# Patient Record
Sex: Male | Born: 1965 | Race: White | Hispanic: No | Marital: Single | Smoking: Former smoker
Health system: Southern US, Community
[De-identification: ages and names within clinical notes are randomized; demographics above are authoritative.]

## PROBLEM LIST (undated history)

## (undated) DIAGNOSIS — E440 Moderate protein-calorie malnutrition: Secondary | ICD-10-CM

## (undated) DIAGNOSIS — M545 Low back pain: Secondary | ICD-10-CM

## (undated) DIAGNOSIS — G8929 Other chronic pain: Secondary | ICD-10-CM

## (undated) DIAGNOSIS — M1990 Unspecified osteoarthritis, unspecified site: Secondary | ICD-10-CM

## (undated) DIAGNOSIS — I76 Septic arterial embolism: Secondary | ICD-10-CM

## (undated) DIAGNOSIS — I340 Nonrheumatic mitral (valve) insufficiency: Secondary | ICD-10-CM

## (undated) DIAGNOSIS — I058 Other rheumatic mitral valve diseases: Secondary | ICD-10-CM

## (undated) DIAGNOSIS — F1910 Other psychoactive substance abuse, uncomplicated: Secondary | ICD-10-CM

## (undated) DIAGNOSIS — G009 Bacterial meningitis, unspecified: Secondary | ICD-10-CM

## (undated) DIAGNOSIS — J90 Pleural effusion, not elsewhere classified: Secondary | ICD-10-CM

## (undated) DIAGNOSIS — N179 Acute kidney failure, unspecified: Secondary | ICD-10-CM

## (undated) DIAGNOSIS — I059 Rheumatic mitral valve disease, unspecified: Secondary | ICD-10-CM

## (undated) DIAGNOSIS — I509 Heart failure, unspecified: Secondary | ICD-10-CM

## (undated) DIAGNOSIS — D696 Thrombocytopenia, unspecified: Secondary | ICD-10-CM

## (undated) DIAGNOSIS — B9562 Methicillin resistant Staphylococcus aureus infection as the cause of diseases classified elsewhere: Secondary | ICD-10-CM

## (undated) DIAGNOSIS — M009 Pyogenic arthritis, unspecified: Secondary | ICD-10-CM

## (undated) DIAGNOSIS — B1920 Unspecified viral hepatitis C without hepatic coma: Secondary | ICD-10-CM

## (undated) DIAGNOSIS — R768 Other specified abnormal immunological findings in serum: Secondary | ICD-10-CM

## (undated) DIAGNOSIS — R7881 Bacteremia: Secondary | ICD-10-CM

## (undated) HISTORY — PX: ANKLE SURGERY: SHX546

## (undated) HISTORY — PX: TONSILLECTOMY: SUR1361

## (undated) HISTORY — PX: FRACTURE SURGERY: SHX138

---

## 1992-06-03 HISTORY — PX: HIP FRACTURE SURGERY: SHX118

## 2004-12-30 ENCOUNTER — Emergency Department (HOSPITAL_COMMUNITY): Admission: EM | Admit: 2004-12-30 | Discharge: 2004-12-30 | Payer: Self-pay | Admitting: Emergency Medicine

## 2005-02-15 ENCOUNTER — Emergency Department (HOSPITAL_COMMUNITY): Admission: EM | Admit: 2005-02-15 | Discharge: 2005-02-15 | Payer: Self-pay | Admitting: Emergency Medicine

## 2005-06-21 ENCOUNTER — Emergency Department (HOSPITAL_COMMUNITY): Admission: EM | Admit: 2005-06-21 | Discharge: 2005-06-21 | Payer: Self-pay | Admitting: Emergency Medicine

## 2007-02-11 ENCOUNTER — Emergency Department (HOSPITAL_COMMUNITY): Admission: EM | Admit: 2007-02-11 | Discharge: 2007-02-11 | Payer: Self-pay | Admitting: Emergency Medicine

## 2012-11-18 ENCOUNTER — Encounter (HOSPITAL_COMMUNITY): Payer: Self-pay

## 2012-11-18 ENCOUNTER — Emergency Department (HOSPITAL_COMMUNITY)
Admission: EM | Admit: 2012-11-18 | Discharge: 2012-11-19 | Disposition: A | Discharge status: Home / Self Care | Payer: Self-pay | Attending: Emergency Medicine | Admitting: Emergency Medicine

## 2012-11-18 DIAGNOSIS — Y9389 Activity, other specified: Secondary | ICD-10-CM | POA: Insufficient documentation

## 2012-11-18 DIAGNOSIS — F17200 Nicotine dependence, unspecified, uncomplicated: Secondary | ICD-10-CM | POA: Insufficient documentation

## 2012-11-18 DIAGNOSIS — W540XXA Bitten by dog, initial encounter: Secondary | ICD-10-CM | POA: Insufficient documentation

## 2012-11-18 DIAGNOSIS — S01319D Laceration without foreign body of unspecified ear, subsequent encounter: Secondary | ICD-10-CM

## 2012-11-18 DIAGNOSIS — S01309A Unspecified open wound of unspecified ear, initial encounter: Secondary | ICD-10-CM | POA: Insufficient documentation

## 2012-11-18 DIAGNOSIS — Y929 Unspecified place or not applicable: Secondary | ICD-10-CM | POA: Insufficient documentation

## 2012-11-18 DIAGNOSIS — T148XXA Other injury of unspecified body region, initial encounter: Secondary | ICD-10-CM

## 2012-11-18 NOTE — ED Notes (Signed)
Pt presents with c/o dog bite. Pt was bit by a pit bull mix about 30 minutes ago. Pt has a 2 inch laceration on the left lower part of his hairline on the left side. Pt also has a tear/laceration to the top of his left ear. Bleeding controlled at this time.

## 2012-11-19 MED ORDER — HYDROMORPHONE HCL PF 1 MG/ML IJ SOLN
1.0000 mg | Freq: Once | INTRAMUSCULAR | Status: AC
  Administered 2012-11-19: 1 mg via INTRAVENOUS
  Filled 2012-11-19: qty 1

## 2012-11-19 MED ORDER — AMOXICILLIN-POT CLAVULANATE 875-125 MG PO TABS: 1.0000 | ORAL_TABLET | Freq: Two times a day (BID) | ORAL | Status: DC

## 2012-11-19 MED ORDER — SODIUM CHLORIDE 0.9 % IV SOLN
Freq: Once | INTRAVENOUS | Status: AC
  Administered 2012-11-19: 01:00:00 via INTRAVENOUS

## 2012-11-19 MED ORDER — TETANUS-DIPHTH-ACELL PERTUSSIS 5-2.5-18.5 LF-MCG/0.5 IM SUSP
0.5000 mL | Freq: Once | INTRAMUSCULAR | Status: AC
  Administered 2012-11-19: 0.5 mL via INTRAMUSCULAR
  Filled 2012-11-19: qty 0.5

## 2012-11-19 MED ORDER — OXYCODONE-ACETAMINOPHEN 5-325 MG PO TABS: 1.0000 | ORAL_TABLET | ORAL | Status: DC | PRN

## 2012-11-19 MED ORDER — OXYCODONE-ACETAMINOPHEN 5-325 MG PO TABS
1.0000 | ORAL_TABLET | Freq: Once | ORAL | Status: AC
  Administered 2012-11-19: 1 via ORAL
  Filled 2012-11-19: qty 1

## 2012-11-19 MED ORDER — ONDANSETRON HCL 4 MG/2ML IJ SOLN
4.0000 mg | Freq: Once | INTRAMUSCULAR | Status: AC
  Administered 2012-11-19: 4 mg via INTRAVENOUS
  Filled 2012-11-19: qty 2

## 2012-11-19 MED ORDER — SODIUM CHLORIDE 0.9 % IV SOLN
3.0000 g | Freq: Once | INTRAVENOUS | Status: AC
  Administered 2012-11-19: 3 g via INTRAVENOUS
  Filled 2012-11-19: qty 3

## 2012-11-19 NOTE — ED Provider Notes (Signed)
History     CSN: [id number]  Arrival date & time 11/18/12  2346   First MD Initiated Contact with Patient 11/19/12 0001      Chief Complaint  Patient presents with  . Animal Bite    (Consider location/radiation/quality/duration/timing/severity/associated sxs/prior treatment) HPI Comments: Patient states he was in his yard with a dog, that they've had for approximately 6 months he inadvertently knocked him to the ground.  The dog then grabbed him by the head  he now has a laceration approximately 5 cm long to the base of the scalp, along the hairline, as well as a complex laceration to the pinna through the tragus of his left ear.  No active bleeding at this time.  The rabies status of the dog is unknown.  Patient's tetanus status is out of date.  The dog is in a candle in her home at this time  Patient is a 47 y.o. male presenting with animal bite. The history is provided by the patient.  Animal Bite Contact animal:  Dog Time since incident:  1 hour Pain details:    Severity:  Severe   Timing:  Constant Incident location:  Home Provoked: unprovoked   Notifications:  None Animal's rabies vaccination status:  Unknown Animal in possession: yes   Tetanus status:  Out of date Relieved by:  None tried Worsened by:  Activity Associated symptoms: no fever and no numbness     History reviewed. No pertinent past medical history.  Past Surgical History  Procedure Laterality Date  . Hip surgery    . Ankle surgery      No family history on file.  History  Substance Use Topics  . Smoking status: Current Every Day Smoker  . Smokeless tobacco: Not on file  . Alcohol Use: Yes     Comment: occasionally       Review of Systems  Constitutional: Negative for fever and chills.  HENT: Negative for hearing loss.   Respiratory: Negative.   Cardiovascular: Negative.   Gastrointestinal: Negative for nausea.  Genitourinary: Negative.   Skin: Positive for wound.    Allergic/Immunologic: Negative.   Neurological: Negative for dizziness, numbness and headaches.  Hematological: Negative.   Psychiatric/Behavioral: Negative.   All other systems reviewed and are negative.    Allergies  Review of patient's allergies indicates no known allergies.  Home Medications   Current Outpatient Rx  Name  Route  Sig  Dispense  Refill  . amoxicillin-clavulanate (AUGMENTIN) 875-125 MG per tablet   Oral   Take 1 tablet by mouth 2 (two) times daily.   23 tablet   0   . oxyCODONE-acetaminophen (PERCOCET/ROXICET) 5-325 MG per tablet   Oral   Take 1 tablet by mouth every 4 (four) hours as needed for pain.   30 tablet   0     BP 114/61  Pulse 92  Temp(Src) 99.2 F (37.3 C) (Oral)  Resp 18  Ht 5\' 11"  (1.803 m)  Wt 185 lb (83.915 kg)  BMI 25.81 kg/m2  SpO2 95%  Physical Exam  Nursing note and vitals reviewed. Constitutional: He appears well-developed and well-nourished.  HENT:  Head: Normocephalic. Head is with laceration.    Ears:  Laceration with cartilage exposed  Linear laceration to base of skull 5CM   Eyes: Pupils are equal, round, and reactive to light.  Neck: Normal range of motion.  Cardiovascular: Normal rate and regular rhythm.   Pulmonary/Chest: Effort normal and breath sounds normal.  Musculoskeletal: Normal range of  motion.  Lymphadenopathy:    He has no cervical adenopathy.  Neurological: He is alert.  Skin: Skin is warm and dry.    ED Course  LACERATION REPAIR Date/Time: 11/19/2012 2:19 AM Performed by: Arman Filter Authorized by: Arman Filter Consent: Verbal consent obtained. Risks and benefits: risks, benefits and alternatives were discussed Consent given by: patient Patient understanding: patient states understanding of the procedure being performed Patient identity confirmed: verbally with patient Time out: Immediately prior to procedure a "time out" was called to verify the correct patient, procedure,  equipment, support staff and site/side marked as required. Body area: head/neck Location details: neck Laceration length: 5 cm Foreign bodies: no foreign bodies Tendon involvement: none Nerve involvement: none Vascular damage: no Anesthesia: local infiltration Local anesthetic: lidocaine 1% with epinephrine Anesthetic total: 2 ml Patient sedated: no Preparation: Patient was prepped and draped in the usual sterile fashion. Irrigation solution: saline Irrigation method: syringe Amount of cleaning: standard Debridement: none Degree of undermining: none Skin closure: 4-0 Prolene Number of sutures: 3 Technique: simple Approximation: loose Approximation difficulty: simple Dressing: 4x4 sterile gauze Patient tolerance: Patient tolerated the procedure well with no immediate complications. Comments: Wet dressing placed over left ear, Kerlix use to secure dressing in place   (including critical care time)  Labs Reviewed - No data to display No results found.   1. Bite by animal   2. Laceration of ear lobe, unspecified laterality, subsequent encounter       MDM  Extensive laceration to left ear.  Will contact ENT Dr. Chales Salmon  , request that patient come to his office at 8 AM in the morning for repair of his extensive, your laceration.  Patient will be supplied with prescription for antibiotic, as well as pain control in the emergency department.  He received 3 g of Unasyn IV pain control.  His tetanus was also updated.  He understands the importance of followup with Dr. Chales Salmon, and agrees to the plan        Arman Filter, NP 11/19/12 0230

## 2012-11-19 NOTE — ED Provider Notes (Signed)
Medical screening examination/treatment/procedure(s) were conducted as a shared visit with non-physician practitioner(s) and myself.  I personally evaluated the patient during the encounter.  Pt s/p dog bite. He has complicated laceration to the left ear involving cartilage.  Will require multiple layer repair.  D/w Dr Chales Salmon who requests patient come to the office at 8 am, NPO after midnight for repair.  Ms Tomasa Blase to repair posterior scalp injury.  Updated on tetanus, given abx.  Olivia Mackie, MD 11/19/12 [phone]

## 2015-12-30 ENCOUNTER — Emergency Department (HOSPITAL_COMMUNITY): Payer: No Typology Code available for payment source

## 2015-12-30 ENCOUNTER — Emergency Department (HOSPITAL_COMMUNITY)
Admission: EM | Admit: 2015-12-30 | Discharge: 2015-12-30 | Disposition: A | Discharge status: Home / Self Care | Payer: No Typology Code available for payment source | Attending: Emergency Medicine | Admitting: Emergency Medicine

## 2015-12-30 ENCOUNTER — Encounter (HOSPITAL_COMMUNITY): Payer: Self-pay | Admitting: Emergency Medicine

## 2015-12-30 DIAGNOSIS — M549 Dorsalgia, unspecified: Secondary | ICD-10-CM | POA: Diagnosis present

## 2015-12-30 DIAGNOSIS — Y92411 Interstate highway as the place of occurrence of the external cause: Secondary | ICD-10-CM | POA: Insufficient documentation

## 2015-12-30 DIAGNOSIS — Y999 Unspecified external cause status: Secondary | ICD-10-CM | POA: Insufficient documentation

## 2015-12-30 DIAGNOSIS — Z87891 Personal history of nicotine dependence: Secondary | ICD-10-CM | POA: Insufficient documentation

## 2015-12-30 DIAGNOSIS — Z79899 Other long term (current) drug therapy: Secondary | ICD-10-CM | POA: Diagnosis not present

## 2015-12-30 DIAGNOSIS — Y9389 Activity, other specified: Secondary | ICD-10-CM | POA: Insufficient documentation

## 2015-12-30 DIAGNOSIS — M542 Cervicalgia: Secondary | ICD-10-CM | POA: Insufficient documentation

## 2015-12-30 DIAGNOSIS — M62830 Muscle spasm of back: Secondary | ICD-10-CM | POA: Diagnosis not present

## 2015-12-30 MED ORDER — DIAZEPAM 5 MG PO TABS
5.0000 mg | ORAL_TABLET | Freq: Once | ORAL | Status: AC
  Administered 2015-12-30: 5 mg via ORAL
  Filled 2015-12-30: qty 1

## 2015-12-30 MED ORDER — OXYCODONE-ACETAMINOPHEN 5-325 MG PO TABS: 1.0000 | ORAL_TABLET | ORAL | 0 refills | Status: DC | PRN

## 2015-12-30 MED ORDER — KETOROLAC TROMETHAMINE 30 MG/ML IJ SOLN
30.0000 mg | Freq: Once | INTRAMUSCULAR | Status: AC
  Administered 2015-12-30: 30 mg via INTRAMUSCULAR
  Filled 2015-12-30: qty 1

## 2015-12-30 MED ORDER — OXYCODONE-ACETAMINOPHEN 5-325 MG PO TABS
1.0000 | ORAL_TABLET | Freq: Once | ORAL | Status: AC
  Administered 2015-12-30: 1 via ORAL
  Filled 2015-12-30: qty 1

## 2015-12-30 MED ORDER — DIAZEPAM 5 MG PO TABS: 5.0000 mg | ORAL_TABLET | Freq: Four times a day (QID) | ORAL | 0 refills | Status: DC | PRN
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
## 2016-01-05 ENCOUNTER — Emergency Department (HOSPITAL_COMMUNITY)
Admission: EM | Admit: 2016-01-05 | Discharge: 2016-01-05 | Disposition: A | Discharge status: Home / Self Care | Payer: No Typology Code available for payment source | Attending: Emergency Medicine | Admitting: Emergency Medicine

## 2016-01-05 ENCOUNTER — Emergency Department (HOSPITAL_COMMUNITY): Payer: No Typology Code available for payment source

## 2016-01-05 ENCOUNTER — Encounter (HOSPITAL_COMMUNITY): Payer: Self-pay | Admitting: Emergency Medicine

## 2016-01-05 DIAGNOSIS — Z791 Long term (current) use of non-steroidal anti-inflammatories (NSAID): Secondary | ICD-10-CM | POA: Diagnosis not present

## 2016-01-05 DIAGNOSIS — S6992XA Unspecified injury of left wrist, hand and finger(s), initial encounter: Secondary | ICD-10-CM | POA: Diagnosis present

## 2016-01-05 DIAGNOSIS — S52122A Displaced fracture of head of left radius, initial encounter for closed fracture: Secondary | ICD-10-CM

## 2016-01-05 DIAGNOSIS — Z87891 Personal history of nicotine dependence: Secondary | ICD-10-CM | POA: Diagnosis not present

## 2016-01-05 DIAGNOSIS — Y92410 Unspecified street and highway as the place of occurrence of the external cause: Secondary | ICD-10-CM | POA: Insufficient documentation

## 2016-01-05 DIAGNOSIS — S52182A Other fracture of upper end of left radius, initial encounter for closed fracture: Secondary | ICD-10-CM | POA: Insufficient documentation

## 2016-01-05 DIAGNOSIS — Y999 Unspecified external cause status: Secondary | ICD-10-CM | POA: Diagnosis not present

## 2016-01-05 DIAGNOSIS — M545 Low back pain, unspecified: Secondary | ICD-10-CM

## 2016-01-05 DIAGNOSIS — Y9389 Activity, other specified: Secondary | ICD-10-CM | POA: Insufficient documentation

## 2016-01-05 MED ORDER — OXYCODONE HCL 5 MG PO TABS
5.0000 mg | ORAL_TABLET | Freq: Once | ORAL | Status: AC
  Administered 2016-01-05: 5 mg via ORAL
  Filled 2016-01-05: qty 1

## 2016-01-05 MED ORDER — DIAZEPAM 5 MG PO TABS
5.0000 mg | ORAL_TABLET | Freq: Once | ORAL | Status: AC
  Administered 2016-01-05: 5 mg via ORAL
  Filled 2016-01-05: qty 1

## 2016-01-05 MED ORDER — ACETAMINOPHEN 500 MG PO TABS
1000.0000 mg | ORAL_TABLET | Freq: Once | ORAL | Status: AC
  Administered 2016-01-05: 1000 mg via ORAL
  Filled 2016-01-05: qty 2

## 2016-01-05 MED ORDER — IBUPROFEN 800 MG PO TABS
800.0000 mg | ORAL_TABLET | Freq: Once | ORAL | Status: AC
  Administered 2016-01-05: 800 mg via ORAL
  Filled 2016-01-05: qty 1
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
## 2016-01-05 NOTE — ED Notes (Signed)
Pt states he has a friend that will pick him up from the [place].
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
## 2017-06-03 DIAGNOSIS — M009 Pyogenic arthritis, unspecified: Secondary | ICD-10-CM

## 2017-06-03 DIAGNOSIS — N179 Acute kidney failure, unspecified: Secondary | ICD-10-CM

## 2017-06-03 DIAGNOSIS — I76 Septic arterial embolism: Secondary | ICD-10-CM

## 2017-06-03 HISTORY — DX: Acute kidney failure, unspecified: N17.9

## 2017-06-03 HISTORY — DX: Pyogenic arthritis, unspecified: M00.9

## 2017-06-03 HISTORY — DX: Septic arterial embolism: I76

## 2017-06-08 ENCOUNTER — Emergency Department (HOSPITAL_COMMUNITY): Payer: Medicaid Other

## 2017-06-08 ENCOUNTER — Other Ambulatory Visit: Payer: Self-pay

## 2017-06-08 ENCOUNTER — Inpatient Hospital Stay (HOSPITAL_COMMUNITY)
Admission: EM | Admit: 2017-06-08 | Discharge: 2017-06-23 | DRG: 853 | Disposition: A | Discharge status: Skilled Nursing Facility | Payer: Medicaid Other | Attending: Internal Medicine | Admitting: Internal Medicine

## 2017-06-08 DIAGNOSIS — B1920 Unspecified viral hepatitis C without hepatic coma: Secondary | ICD-10-CM | POA: Diagnosis present

## 2017-06-08 DIAGNOSIS — I059 Rheumatic mitral valve disease, unspecified: Secondary | ICD-10-CM | POA: Diagnosis present

## 2017-06-08 DIAGNOSIS — I10 Essential (primary) hypertension: Secondary | ICD-10-CM | POA: Diagnosis present

## 2017-06-08 DIAGNOSIS — R471 Dysarthria and anarthria: Secondary | ICD-10-CM | POA: Diagnosis present

## 2017-06-08 DIAGNOSIS — L89620 Pressure ulcer of left heel, unstageable: Secondary | ICD-10-CM | POA: Diagnosis present

## 2017-06-08 DIAGNOSIS — R748 Abnormal levels of other serum enzymes: Secondary | ICD-10-CM | POA: Diagnosis present

## 2017-06-08 DIAGNOSIS — Z6821 Body mass index (BMI) 21.0-21.9, adult: Secondary | ICD-10-CM

## 2017-06-08 DIAGNOSIS — I058 Other rheumatic mitral valve diseases: Secondary | ICD-10-CM | POA: Diagnosis present

## 2017-06-08 DIAGNOSIS — E874 Mixed disorder of acid-base balance: Secondary | ICD-10-CM | POA: Diagnosis present

## 2017-06-08 DIAGNOSIS — G9341 Metabolic encephalopathy: Secondary | ICD-10-CM | POA: Diagnosis present

## 2017-06-08 DIAGNOSIS — R402352 Coma scale, best motor response, localizes pain, at arrival to emergency department: Secondary | ICD-10-CM | POA: Diagnosis present

## 2017-06-08 DIAGNOSIS — D649 Anemia, unspecified: Secondary | ICD-10-CM | POA: Diagnosis present

## 2017-06-08 DIAGNOSIS — E8779 Other fluid overload: Secondary | ICD-10-CM | POA: Diagnosis not present

## 2017-06-08 DIAGNOSIS — F1410 Cocaine abuse, uncomplicated: Secondary | ICD-10-CM | POA: Diagnosis present

## 2017-06-08 DIAGNOSIS — F1910 Other psychoactive substance abuse, uncomplicated: Secondary | ICD-10-CM | POA: Diagnosis present

## 2017-06-08 DIAGNOSIS — N289 Disorder of kidney and ureter, unspecified: Secondary | ICD-10-CM

## 2017-06-08 DIAGNOSIS — G009 Bacterial meningitis, unspecified: Secondary | ICD-10-CM

## 2017-06-08 DIAGNOSIS — E440 Moderate protein-calorie malnutrition: Secondary | ICD-10-CM

## 2017-06-08 DIAGNOSIS — M009 Pyogenic arthritis, unspecified: Secondary | ICD-10-CM | POA: Diagnosis present

## 2017-06-08 DIAGNOSIS — R1310 Dysphagia, unspecified: Secondary | ICD-10-CM | POA: Diagnosis present

## 2017-06-08 DIAGNOSIS — K921 Melena: Secondary | ICD-10-CM | POA: Diagnosis not present

## 2017-06-08 DIAGNOSIS — E876 Hypokalemia: Secondary | ICD-10-CM | POA: Diagnosis not present

## 2017-06-08 DIAGNOSIS — F17210 Nicotine dependence, cigarettes, uncomplicated: Secondary | ICD-10-CM | POA: Diagnosis present

## 2017-06-08 DIAGNOSIS — F1110 Opioid abuse, uncomplicated: Secondary | ICD-10-CM | POA: Diagnosis present

## 2017-06-08 DIAGNOSIS — G003 Staphylococcal meningitis: Secondary | ICD-10-CM | POA: Diagnosis present

## 2017-06-08 DIAGNOSIS — N179 Acute kidney failure, unspecified: Secondary | ICD-10-CM

## 2017-06-08 DIAGNOSIS — G9340 Encephalopathy, unspecified: Secondary | ICD-10-CM | POA: Diagnosis present

## 2017-06-08 DIAGNOSIS — D696 Thrombocytopenia, unspecified: Secondary | ICD-10-CM | POA: Diagnosis present

## 2017-06-08 DIAGNOSIS — A4101 Sepsis due to Methicillin susceptible Staphylococcus aureus: Principal | ICD-10-CM | POA: Diagnosis present

## 2017-06-08 DIAGNOSIS — E875 Hyperkalemia: Secondary | ICD-10-CM

## 2017-06-08 DIAGNOSIS — L8990 Pressure ulcer of unspecified site, unspecified stage: Secondary | ICD-10-CM

## 2017-06-08 DIAGNOSIS — I330 Acute and subacute infective endocarditis: Secondary | ICD-10-CM

## 2017-06-08 DIAGNOSIS — I76 Septic arterial embolism: Secondary | ICD-10-CM | POA: Diagnosis present

## 2017-06-08 DIAGNOSIS — L89150 Pressure ulcer of sacral region, unstageable: Secondary | ICD-10-CM | POA: Diagnosis present

## 2017-06-08 DIAGNOSIS — R402212 Coma scale, best verbal response, none, at arrival to emergency department: Secondary | ICD-10-CM | POA: Diagnosis present

## 2017-06-08 DIAGNOSIS — G8194 Hemiplegia, unspecified affecting left nondominant side: Secondary | ICD-10-CM | POA: Diagnosis present

## 2017-06-08 DIAGNOSIS — A419 Sepsis, unspecified organism: Secondary | ICD-10-CM

## 2017-06-08 DIAGNOSIS — R402112 Coma scale, eyes open, never, at arrival to emergency department: Secondary | ICD-10-CM | POA: Diagnosis present

## 2017-06-08 DIAGNOSIS — R7881 Bacteremia: Secondary | ICD-10-CM

## 2017-06-08 DIAGNOSIS — I6340 Cerebral infarction due to embolism of unspecified cerebral artery: Secondary | ICD-10-CM | POA: Diagnosis present

## 2017-06-08 DIAGNOSIS — E871 Hypo-osmolality and hyponatremia: Secondary | ICD-10-CM | POA: Diagnosis present

## 2017-06-08 DIAGNOSIS — R768 Other specified abnormal immunological findings in serum: Secondary | ICD-10-CM | POA: Diagnosis present

## 2017-06-08 DIAGNOSIS — R4182 Altered mental status, unspecified: Secondary | ICD-10-CM

## 2017-06-08 DIAGNOSIS — K089 Disorder of teeth and supporting structures, unspecified: Secondary | ICD-10-CM

## 2017-06-08 HISTORY — DX: Rheumatic mitral valve disease, unspecified: I05.9

## 2017-06-08 HISTORY — DX: Unspecified viral hepatitis C without hepatic coma: B19.20

## 2017-06-08 HISTORY — DX: Thrombocytopenia, unspecified: D69.6

## 2017-06-08 HISTORY — DX: Acute kidney failure, unspecified: N17.9

## 2017-06-08 HISTORY — DX: Pyogenic arthritis, unspecified: M00.9

## 2017-06-08 HISTORY — DX: Other chronic pain: G89.29

## 2017-06-08 HISTORY — DX: Methicillin resistant Staphylococcus aureus infection as the cause of diseases classified elsewhere: B95.62

## 2017-06-08 HISTORY — DX: Other rheumatic mitral valve diseases: I05.8

## 2017-06-08 HISTORY — DX: Low back pain: M54.5

## 2017-06-08 HISTORY — DX: Unspecified osteoarthritis, unspecified site: M19.90

## 2017-06-08 HISTORY — DX: Other psychoactive substance abuse, uncomplicated: F19.10

## 2017-06-08 HISTORY — DX: Bacterial meningitis, unspecified: G00.9

## 2017-06-08 HISTORY — DX: Bacteremia: R78.81

## 2017-06-08 HISTORY — DX: Septic arterial embolism: I76

## 2017-06-08 LAB — CBC WITH DIFFERENTIAL/PLATELET
BAND NEUTROPHILS: 7 %
BASOS ABS: 0 10*3/uL (ref 0.0–0.1)
BASOS PCT: 0 %
EOS PCT: 0 %
Eosinophils Absolute: 0 10*3/uL (ref 0.0–0.7)
HEMATOCRIT: 46.9 % (ref 39.0–52.0)
Hemoglobin: 16.4 g/dL (ref 13.0–17.0)
Lymphocytes Relative: 3 %
Lymphs Abs: 1 10*3/uL (ref 0.7–4.0)
MCH: 28.8 pg (ref 26.0–34.0)
MCHC: 35 g/dL (ref 30.0–36.0)
MCV: 82.4 fL (ref 78.0–100.0)
Monocytes Absolute: 0.3 10*3/uL (ref 0.1–1.0)
Monocytes Relative: 1 %
NEUTROS PCT: 89 %
Neutro Abs: 32.2 10*3/uL — ABNORMAL HIGH (ref 1.7–7.7)
Platelets: 134 10*3/uL — ABNORMAL LOW (ref 150–400)
RBC: 5.69 MIL/uL (ref 4.22–5.81)
RDW: 13.8 % (ref 11.5–15.5)
WBC: 33.5 10*3/uL — ABNORMAL HIGH (ref 4.0–10.5)

## 2017-06-08 LAB — COMPREHENSIVE METABOLIC PANEL
ALK PHOS: 190 U/L — AB (ref 38–126)
ALT: 107 U/L — AB (ref 17–63)
AST: 187 U/L — AB (ref 15–41)
Albumin: 2.3 g/dL — ABNORMAL LOW (ref 3.5–5.0)
Anion gap: 25 — ABNORMAL HIGH (ref 5–15)
BILIRUBIN TOTAL: 2.2 mg/dL — AB (ref 0.3–1.2)
BUN: 79 mg/dL — AB (ref 6–20)
CALCIUM: 7.9 mg/dL — AB (ref 8.9–10.3)
CHLORIDE: 85 mmol/L — AB (ref 101–111)
CO2: 16 mmol/L — ABNORMAL LOW (ref 22–32)
CREATININE: 3 mg/dL — AB (ref 0.61–1.24)
GFR calc non Af Amer: 23 mL/min — ABNORMAL LOW (ref >60)
GFR, EST AFRICAN AMERICAN: 26 mL/min — AB (ref >60)
Glucose, Bld: 147 mg/dL — ABNORMAL HIGH (ref 65–99)
Potassium: 5.1 mmol/L (ref 3.5–5.1)
Sodium: 126 mmol/L — ABNORMAL LOW (ref 135–145)
Total Protein: 7.4 g/dL (ref 6.5–8.1)

## 2017-06-08 LAB — LIPASE, BLOOD: Lipase: 19 U/L (ref 11–51)

## 2017-06-08 LAB — I-STAT VENOUS BLOOD GAS, ED
ACID-BASE DEFICIT: 1 mmol/L (ref 0.0–2.0)
Bicarbonate: 22.8 mmol/L (ref 20.0–28.0)
O2 Saturation: 35 %
TCO2: 24 mmol/L (ref 22–32)
pCO2, Ven: 33.8 mmHg — ABNORMAL LOW (ref 44.0–60.0)
pH, Ven: 7.437 — ABNORMAL HIGH (ref 7.250–7.430)
pO2, Ven: 20 mmHg — CL (ref 32.0–45.0)

## 2017-06-08 LAB — URINALYSIS, ROUTINE W REFLEX MICROSCOPIC
Bilirubin Urine: NEGATIVE
Glucose, UA: NEGATIVE mg/dL
KETONES UR: NEGATIVE mg/dL
Leukocytes, UA: NEGATIVE
Nitrite: NEGATIVE
Protein, ur: NEGATIVE mg/dL
SPECIFIC GRAVITY, URINE: 1.016 (ref 1.005–1.030)
pH: 5 (ref 5.0–8.0)

## 2017-06-08 LAB — RAPID URINE DRUG SCREEN, HOSP PERFORMED
Amphetamines: NOT DETECTED
BARBITURATES: NOT DETECTED
BENZODIAZEPINES: POSITIVE — AB
COCAINE: POSITIVE — AB
Opiates: POSITIVE — AB
Tetrahydrocannabinol: NOT DETECTED

## 2017-06-08 LAB — I-STAT CHEM 8, ED
BUN: 77 mg/dL — AB (ref 6–20)
CHLORIDE: 91 mmol/L — AB (ref 101–111)
Calcium, Ion: 0.83 mmol/L — CL (ref 1.15–1.40)
Creatinine, Ser: 3 mg/dL — ABNORMAL HIGH (ref 0.61–1.24)
Glucose, Bld: 152 mg/dL — ABNORMAL HIGH (ref 65–99)
HEMATOCRIT: 52 % (ref 39.0–52.0)
Hemoglobin: 17.7 g/dL — ABNORMAL HIGH (ref 13.0–17.0)
POTASSIUM: 5.5 mmol/L — AB (ref 3.5–5.1)
SODIUM: 126 mmol/L — AB (ref 135–145)
TCO2: 21 mmol/L — ABNORMAL LOW (ref 22–32)

## 2017-06-08 LAB — T4, FREE: Free T4: 0.93 ng/dL (ref 0.61–1.12)

## 2017-06-08 LAB — CK: Total CK: 791 U/L — ABNORMAL HIGH (ref 49–397)

## 2017-06-08 LAB — I-STAT CG4 LACTIC ACID, ED
LACTIC ACID, VENOUS: 8.5 mmol/L — AB (ref 0.5–1.9)
LACTIC ACID, VENOUS: 7.09 mmol/L — AB (ref 0.5–1.9)

## 2017-06-08 LAB — GRAM STAIN: Special Requests: NORMAL

## 2017-06-08 LAB — TSH: TSH: 1.817 u[IU]/mL (ref 0.350–4.500)

## 2017-06-08 LAB — PROTIME-INR
INR: 1.49
Prothrombin Time: 17.9 seconds — ABNORMAL HIGH (ref 11.4–15.2)

## 2017-06-08 LAB — I-STAT TROPONIN, ED: Troponin i, poc: 0.4 ng/mL (ref 0.00–0.08)

## 2017-06-08 LAB — SALICYLATE LEVEL: Salicylate Lvl: <7 mg/dL (ref 2.8–30.0)

## 2017-06-08 LAB — ETHANOL

## 2017-06-08 LAB — ACETAMINOPHEN LEVEL

## 2017-06-08 MED ORDER — PIPERACILLIN-TAZOBACTAM 3.375 G IVPB
3.3750 g | Freq: Three times a day (TID) | INTRAVENOUS | Status: DC
  Administered 2017-06-09: 3.375 g via INTRAVENOUS
  Filled 2017-06-08 (×2): qty 50

## 2017-06-08 MED ORDER — SODIUM CHLORIDE 0.9 % IV BOLUS (SEPSIS)
1000.0000 mL | Freq: Once | INTRAVENOUS | Status: AC
  Administered 2017-06-08: 1000 mL via INTRAVENOUS

## 2017-06-08 MED ORDER — ACETAMINOPHEN 650 MG RE SUPP
650.0000 mg | Freq: Once | RECTAL | Status: AC
  Administered 2017-06-08: 650 mg via RECTAL
  Filled 2017-06-08: qty 1

## 2017-06-08 MED ORDER — SODIUM CHLORIDE 0.9 % IV SOLN
Freq: Once | INTRAVENOUS | Status: AC
  Administered 2017-06-08: 22:00:00 via INTRAVENOUS

## 2017-06-08 MED ORDER — LIDOCAINE-PRILOCAINE 2.5-2.5 % EX CREA
TOPICAL_CREAM | Freq: Once | CUTANEOUS | Status: AC
  Administered 2017-06-08: 21:00:00 via TOPICAL
  Filled 2017-06-08: qty 5

## 2017-06-08 MED ORDER — LIDOCAINE-EPINEPHRINE 1 %-1:100000 IJ SOLN
10.0000 mL | Freq: Once | INTRAMUSCULAR | Status: AC
  Administered 2017-06-08: 10 mL
  Filled 2017-06-08: qty 10

## 2017-06-08 MED ORDER — VANCOMYCIN HCL IN DEXTROSE 1-5 GM/200ML-% IV SOLN
1000.0000 mg | INTRAVENOUS | Status: DC
  Filled 2017-06-08: qty 200

## 2017-06-08 MED ORDER — VANCOMYCIN HCL IN DEXTROSE 1-5 GM/200ML-% IV SOLN
1000.0000 mg | Freq: Once | INTRAVENOUS | Status: AC
  Administered 2017-06-08: 1000 mg via INTRAVENOUS
  Filled 2017-06-08: qty 200

## 2017-06-08 MED ORDER — SODIUM CHLORIDE 0.9 % IV BOLUS (SEPSIS)
500.0000 mL | Freq: Once | INTRAVENOUS | Status: AC
  Administered 2017-06-08: 500 mL via INTRAVENOUS

## 2017-06-08 MED ORDER — PIPERACILLIN-TAZOBACTAM 3.375 G IVPB 30 MIN
3.3750 g | Freq: Once | INTRAVENOUS | Status: AC
  Administered 2017-06-08: 3.375 g via INTRAVENOUS
  Filled 2017-06-08: qty 50

## 2017-06-08 NOTE — ED Notes (Signed)
US at bedside

## 2017-06-08 NOTE — Progress Notes (Signed)
   06/08/17 2100  Clinical Encounter Type  Visited With Patient and family together  Visit Type Trauma  Referral From Nurse  Spiritual Encounters  Spiritual Needs Prayer  Stress Factors  Family Stress Factors Family relationships;Health changes   Chaplain received page from ED to visit with Adley's mom and son. Woodie reconnected with family at Christmas after two years apart and, soon thereafter, Helyn NumbersBryon became seriously ill. The family is understandable anxious about Ahren's health and looks forward to having answers. They have a good familial support system. Chaplain prayed with them at bedside.

## 2017-06-08 NOTE — Progress Notes (Signed)
Pharmacy Antibiotic Note  Marcus [name] is a 52 y.o. male admitted on 06/08/2017 with sepsis.  Pharmacy has been consulted for vancomycin and zosyn dosing. Patient was found unresponsive in his bathroom. EMS reported PMH significant for drug abuse, narcan given with no response. Temp 100.2, WBC 33.5, LA 8.5   Plan: Vancomycin 1000mg  IV x1 then 1000 mg  IV every 24 hours.  Goal trough 15-20 mcg/mL. Zosyn 3.375g IV q8h (4 hour infusion).  Monitor clinical progression and LOT    Temp (24hrs), Avg:99 F (37.2 C), Min:97.8 F (36.6 C), Max:100.2 F (37.9 C)  Recent Labs  Lab 06/08/17 1757  CREATININE 3.00*  LATICACIDVEN 8.50*    CrCl cannot be calculated (Unknown ideal weight.).    No Known Allergies  Thank you for allowing pharmacy to be a part of this patient's care.  Toniann Failony L Carolan Avedisian 06/08/2017 6:23 PM

## 2017-06-08 NOTE — ED Notes (Signed)
Family at bedside with chaplin.

## 2017-06-08 NOTE — ED Notes (Signed)
Family updated.

## 2017-06-08 NOTE — ED Notes (Signed)
Family at bedside, Dr Clarene DukeLittle speaking with family.

## 2017-06-08 NOTE — ED Triage Notes (Signed)
Patient presents to ed vis [facility] states he was last seen normal at 12 noon. Patient was found on bathroom floor by friend unresp. Very cold to touch . Pupils 4 bilaterally and sluggish. Patient responses very sluggish to painful stimuli

## 2017-06-08 NOTE — ED Provider Notes (Addendum)
[facility] EMERGENCY DEPARTMENT Provider Note   CSN: [id number] Arrival date & time: 06/08/17  1712     History   Chief Complaint Chief Complaint  Patient presents with  . Altered Mental Status    HPI Marcus [name] is a 52 y.o. male.  52 year old male with unknown past medical history presents with altered mental status.  Patient was last seen normal by his girlfriend at 12:00.  This afternoon, she found him down on the ground on his right side, altered and unresponsive.  He has been tachycardic for EMS, blood glucose normal.  No medications prior to arrival.  He does have a known history of opiate abuse.  LEVEL 5 CAVEAT DUE TO AMS   The history is provided by the EMS personnel.  Altered Mental Status      No past medical history on file.  There are no active problems to display for this patient.   Past Surgical History:  Procedure Laterality Date  . ANKLE SURGERY    . HIP FRACTURE SURGERY Left 1994  . HIP SURGERY         Home Medications    Prior to Admission medications   Medication Sig Start Date End Date Taking? Authorizing Provider  acetaminophen (TYLENOL) 500 MG tablet Take 500-1,000 mg by mouth every 6 (six) hours as needed (for pain or headaches).   Yes [provider]  ibuprofen (ADVIL,MOTRIN) 200 MG tablet Take 200-600 mg by mouth every 6 (six) hours as needed (for pain or headaches).    Yes [provider]  amoxicillin-clavulanate (AUGMENTIN) 875-125 MG per tablet Take 1 tablet by mouth 2 (two) times daily. Patient not taking: Reported on 06/08/2017 11/19/12   Earley Favor, NP  diazepam (VALIUM) 5 MG tablet Take 1 tablet (5 mg total) by mouth every 6 (six) hours as needed for muscle spasms. Patient not taking: Reported on 06/08/2017 12/30/15   Leta Baptist, MD  oxyCODONE-acetaminophen (PERCOCET/ROXICET) 5-325 MG tablet Take 1 tablet by mouth every 4 (four) hours as needed for severe pain (pain). Patient not taking:  Reported on 06/08/2017 12/30/15   Leta Baptist, MD    Family History No family history on file.  Social History Social History   Tobacco Use  . Smoking status: Former Smoker    Packs/day: 1.00    Years: 10.00    Pack years: 10.00    Types: Cigarettes    Last attempt to quit: 11/04/2015    Years since quitting: 1.5  . Smokeless tobacco: Never Used  Substance Use Topics  . Alcohol use: Yes    Comment: occasionally   . Drug use: No     Allergies   Patient has no known allergies.   Review of Systems Review of Systems  Unable to perform ROS: Mental status change     Physical Exam Updated Vital Signs BP 105/69   Pulse 99   Temp 99.3 F (37.4 C)   Resp (!) 24   Wt 77.1 kg (170 lb)   SpO2 99%   BMI 23.71 kg/m   Physical Exam  Constitutional: He appears well-developed.  Chronically ill appearing, somnolent, shivering  HENT:  Head: Normocephalic and atraumatic.  dry mucous membranes, poor dentition  Eyes: Conjunctivae are normal. Pupils are equal, round, and reactive to light.  Sluggish but reactive pupils 3-69mm  Neck: Neck supple.  Cardiovascular: Regular rhythm and normal heart sounds. Tachycardia present.  No murmur heard. Pulmonary/Chest: Effort normal and breath sounds normal.  Abdominal: Soft. Bowel sounds are normal. He exhibits no distension. There is no tenderness.  Musculoskeletal: Normal range of motion.  L knee effusion without warmth or redness  Neurological:  Somnolent, moans to sternal rub, no facial asymmetry  Skin: Skin is warm and dry.  Nursing note and vitals reviewed.    ED Treatments / Results  Labs (all labs ordered are listed, but only abnormal results are displayed) Labs Reviewed  CK - Abnormal; Notable for the following components:      Result Value   Total CK 791 (*)    All other components within normal limits  ACETAMINOPHEN LEVEL - Abnormal; Notable for the following components:   Acetaminophen (Tylenol), Serum <10 (*)      All other components within normal limits  COMPREHENSIVE METABOLIC PANEL - Abnormal; Notable for the following components:   Sodium 126 (*)    Chloride 85 (*)    CO2 16 (*)    Glucose, Bld 147 (*)    BUN 79 (*)    Creatinine, Ser 3.00 (*)    Calcium 7.9 (*)    Albumin 2.3 (*)    AST 187 (*)    ALT 107 (*)    Alkaline Phosphatase 190 (*)    Total Bilirubin 2.2 (*)    GFR calc non Af Amer 23 (*)    GFR calc Af Amer 26 (*)    Anion gap 25 (*)    All other components within normal limits  CBC WITH DIFFERENTIAL/PLATELET - Abnormal; Notable for the following components:   WBC 33.5 (*)    Platelets 134 (*)    Neutro Abs 32.2 (*)    All other components within normal limits  PROTIME-INR - Abnormal; Notable for the following components:   Prothrombin Time 17.9 (*)    All other components within normal limits  URINALYSIS, ROUTINE W REFLEX MICROSCOPIC - Abnormal; Notable for the following components:   Color, Urine AMBER (*)    APPearance CLOUDY (*)    Hgb urine dipstick LARGE (*)    Bacteria, UA RARE (*)    Squamous Epithelial / LPF 0-5 (*)    All other components within normal limits  RAPID URINE DRUG SCREEN, HOSP PERFORMED - Abnormal; Notable for the following components:   Opiates POSITIVE (*)    Cocaine POSITIVE (*)    Benzodiazepines POSITIVE (*)    All other components within normal limits  I-STAT CHEM 8, ED - Abnormal; Notable for the following components:   Sodium 126 (*)    Potassium 5.5 (*)    Chloride 91 (*)    BUN 77 (*)    Creatinine, Ser 3.00 (*)    Glucose, Bld 152 (*)    Calcium, Ion 0.83 (*)    TCO2 21 (*)    Hemoglobin 17.7 (*)    All other components within normal limits  I-STAT CG4 LACTIC ACID, ED - Abnormal; Notable for the following components:   Lactic Acid, Venous 8.50 (*)    All other components within normal limits  I-STAT TROPONIN, ED - Abnormal; Notable for the following components:   Troponin i, poc 0.40 (*)    All other components within  normal limits  I-STAT VENOUS BLOOD GAS, ED - Abnormal; Notable for the following components:   pH, Ven 7.437 (*)    pCO2, Ven 33.8 (*)    pO2, Ven 20.0 (*)    All other components within normal limits  I-STAT CG4 LACTIC ACID, ED - Abnormal; Notable for  the following components:   Lactic Acid, Venous 7.09 (*)    All other components within normal limits  CULTURE, BLOOD (ROUTINE X 2)  CULTURE, BLOOD (ROUTINE X 2)  URINE CULTURE  RESPIRATORY PANEL BY PCR  CSF CULTURE  GRAM STAIN  CULTURE, BLOOD (SINGLE)  ETHANOL  LIPASE, BLOOD  SALICYLATE LEVEL  TSH  T4, FREE  CSF CELL COUNT WITH DIFFERENTIAL  CSF CELL COUNT WITH DIFFERENTIAL  GLUCOSE, CSF  PROTEIN, CSF  VDRL, CSF    EKG  EKG Interpretation  Date/Time:  Sunday June 08 2017 17:34:23 EST Ventricular Rate:  133 PR Interval:    QRS Duration: 97 QT Interval:  307 QTC Calculation: 457 R Axis:   77 Text Interpretation:  Sinus tachycardia Probable left atrial enlargement Abnormal T, consider ischemia, diffuse leads Artifact in lead(s) I aVL V1 Interpretation limited secondary to artifact Confirmed by Frederick Peers [phone]) on 06/08/2017 6:08:18 PM       Radiology Ct Head Wo Contrast  Result Date: 06/08/2017 CLINICAL DATA:  Found unresponsive. EXAM: CT HEAD WITHOUT CONTRAST TECHNIQUE: Contiguous axial images were obtained from the base of the skull through the vertex without intravenous contrast. COMPARISON:  None. FINDINGS: Brain: There is a small focus of low density in the superior right cerebellar hemisphere. There are additional small areas of patchy low-density which involve both cortex and white matter in the medial right parieto-occipital region and in the posterior right frontal lobe. These are concerning for acute or early subacute infarcts, with trace petechial hemorrhage not excluded though without a discrete parenchymal hematoma. No subarachnoid hemorrhage or extra-axial fluid collection is seen, and there is no midline  shift. The ventricles are normal in size. Vascular: Calcified atherosclerosis at the skullbase. No hyperdense vessel. Skull: No fracture or focal osseous lesion. Sinuses/Orbits: Visualized paranasal sinuses and mastoid air cells are clear. Orbits are unremarkable. Other: None. IMPRESSION: Patchy hypodensities in the right cerebral hemisphere and right cerebellum concerning for acute/ early subacute infarcts. Consider MRI for further evaluation. Electronically Signed   By: Sebastian Ache M.D.   On: 06/08/2017 21:02   US Abdomen Complete  Result Date: 06/08/2017 CLINICAL DATA:  Sepsis with elevated liver function tests and bilirubin today. EXAM: ABDOMEN ULTRASOUND COMPLETE COMPARISON:  None. FINDINGS: Gallbladder: Intraluminal tumefactive biliary sludge is noted within the gallbladder lumen. No wall thickening is identified. No pericholecystic fluid is noted. No sonographic Murphy sign noted by sonographer. Common bile duct: Diameter: 4.9 mm and within normal limits in caliber. No choledocholithiasis. Liver: No focal lesion identified. Within normal limits in parenchymal echogenicity. Portal vein is patent on color Doppler imaging with normal direction of blood flow towards the liver. IVC: No abnormality visualized. Pancreas: Visualized portion unremarkable. Spleen: 11.1 x 13.2 x 5.8 cm (volume = 440 cm^3) without space-occupying mass. Right Kidney: Length: 11.5 cm. Echogenicity within normal limits. No mass or hydronephrosis visualized. Left Kidney: Length: 12.1 cm. Echogenicity within normal limits. No mass or hydronephrosis visualized. Abdominal aorta: No aneurysm visualized. Mild aortoiliac atherosclerosis. Other findings: None. IMPRESSION: 1. Biliary sludge noted within the gallbladder without acute findings of cholecystitis. No biliary dilatation. 2. Mild splenomegaly. Electronically Signed   By: Tollie Eth M.D.   On: 06/08/2017 21:52   Dg Chest Port 1 View  Result Date: 06/08/2017 CLINICAL DATA:  Altered  mental status EXAM: PORTABLE CHEST 1 VIEW COMPARISON:  02/15/2005 FINDINGS: The heart size and mediastinal contours are within normal limits. Both lungs are clear. The visualized skeletal structures are unremarkable. IMPRESSION: No  active disease. Electronically Signed   By: Tollie Eth M.D.   On: 06/08/2017 18:18    Procedures .Critical Care Performed by: Laurence Spates, MD Authorized by: Laurence Spates, MD   Critical care provider statement:    Critical care time (minutes):  75   Critical care time was exclusive of:  Separately billable procedures and treating other patients   Critical care was necessary to treat or prevent imminent or life-threatening deterioration of the following conditions:  CNS failure or compromise, sepsis and renal failure   Critical care was time spent personally by me on the following activities:  Development of treatment plan with patient or surrogate, discussions with consultants, evaluation of patient's response to treatment, examination of patient, obtaining history from patient or surrogate, ordering and performing treatments and interventions, ordering and review of laboratory studies, ordering and review of radiographic studies and re-evaluation of patient's condition  .Lumbar Puncture Date/Time: 06/09/2017 8:11 PM Performed by: Laurence Spates, MD Authorized by: Laurence Spates, MD   Consent:    Consent obtained:  Written   Consent given by:  Parent   Risks discussed:  Bleeding, nerve damage and infection   Alternatives discussed:  No treatment Pre-procedure details:    Procedure purpose:  Diagnostic   Preparation: Patient was prepped and draped in usual sterile fashion   Procedure details:    Lumbar space:  L4-L5 interspace   Patient position:  R lateral decubitus   Needle gauge:  20   Needle type:  Spinal needle - Quincke tip   Needle length (in):  2.5   Ultrasound guidance: no     Number of attempts:  1   Fluid  appearance:  Clear   Tubes of fluid:  4   Total volume (ml):  2.5 Post-procedure:    Puncture site:  Adhesive bandage applied   Patient tolerance of procedure:  Tolerated well, no immediate complications   (including critical care time)  Medications Ordered in ED Medications  vancomycin (VANCOCIN) IVPB 1000 mg/200 mL premix (not administered)  piperacillin-tazobactam (ZOSYN) IVPB 3.375 g (not administered)  sodium chloride 0.9 % bolus 1,000 mL (0 mLs Intravenous Stopped 06/08/17 2040)    And  sodium chloride 0.9 % bolus 1,000 mL (0 mLs Intravenous Stopped 06/08/17 1937)    And  sodium chloride 0.9 % bolus 500 mL (0 mLs Intravenous Stopped 06/08/17 2009)  piperacillin-tazobactam (ZOSYN) IVPB 3.375 g (0 g Intravenous Stopped 06/08/17 1935)  vancomycin (VANCOCIN) IVPB 1000 mg/200 mL premix (0 mg Intravenous Stopped 06/08/17 1935)  acetaminophen (TYLENOL) suppository 650 mg (650 mg Rectal Given 06/08/17 1838)  sodium chloride 0.9 % bolus 1,000 mL (0 mLs Intravenous Stopped 06/08/17 2112)  lidocaine-prilocaine (EMLA) cream ( Topical Given 06/08/17 2051)  lidocaine-EPINEPHrine (XYLOCAINE W/EPI) 1 %-1:100000 (with pres) injection 10 mL (10 mLs Other Given 06/08/17 2051)     Initial Impression / Assessment and Plan / ED Course  I have reviewed the triage vital signs and the nursing notes.  Pertinent labs & imaging results that were available during my care of the patient were reviewed by me and considered in my medical decision making (see chart for details).     Pt brought in after found down by girlfriend. Tachycardic, hypertensive. Protecting airway. Obtained labs including cultures, lactate, CK. No response to intranasal narcan. Noted to be febrile. Initiated code sepsis w/ Vanc, zosyn, and IVF bolus. Gave tylenol.  Labs show multiple derangements including initial lactate of 8.5, troponin 0.4, CK 791, creatinine  3, sodium 126, potassium 5.5, AST 187, ALT 107, total bilirubin 2.2, anion gap 25.  WBC  33.5.  INR 1.5.  UA and chest x-ray without obvious signs of infection.  UDS positive for opiates, cocaine, and benzos.  Obtain abdominal ultrasound which showed no evidence of cholecystitis, normal-appearing kidneys.  Head CT shows hypodensities in right cerebral hemisphere and right cerebellum, possible infarcts.  I spoke with Dr. Otelia LimesLindzen, neurology, and we agreed that ddx includes septic emboli. DDx includes meningitis/encephalitis or endocarditis given his family report of opiate abuse.  Son noted recent injury to left knee, no warmth or redness to suggest septic joint.  Obtained consent from mother to perform lumbar puncture to rule out meningitis.  CSF studies have been sent.  Repeat lactate is 7, added another liter of IV fluids. I have updated family several times on work up findings.  I have discussed patient's case with critical care, Dr. Arsenio LoaderSommer, and they will see pt to determine step down vs ICU. Pt will be admitted for further w/u and treatment.  Sepsis - Repeat Assessment  Performed at:    00:00  Vitals     Blood pressure 108/73, pulse 100, temperature 98.4 F (36.9 C), resp. rate (!) 24, weight 77.1 kg (170 lb), SpO2 100 %.  Heart:     Regular rate and rhythm  Lungs:    CTA  Capillary Refill:   <2 sec  Peripheral Pulse:   Radial pulse palpable  Skin:     Pale   Final Clinical Impressions(s) / ED Diagnoses   Final diagnoses:  Sepsis, due to unspecified organism (HCC)  Altered mental status, unspecified altered mental status type  AKI (acute kidney injury) [facility](HCC)  Hyperkalemia    ED Discharge Orders    None       Yusef Lamp, Ambrose Finlandachel Morgan, MD 06/08/17 2334    Clarene DukeLittle, Ambrose Finlandachel Morgan, MD 06/09/17 0046    Clarene DukeLittle, Ambrose Finlandachel Morgan, MD 06/09/17 2012

## 2017-06-08 NOTE — ED Notes (Signed)
Peri care done prior to and after foley insertion

## 2017-06-08 NOTE — ED Notes (Signed)
RN Tresa EndoKelly and EDP Dr. Clarene DukeLittle notified of Critical Lab Results

## 2017-06-09 ENCOUNTER — Inpatient Hospital Stay (HOSPITAL_COMMUNITY): Payer: Medicaid Other

## 2017-06-09 DIAGNOSIS — M25462 Effusion, left knee: Secondary | ICD-10-CM

## 2017-06-09 DIAGNOSIS — I2690 Septic pulmonary embolism without acute cor pulmonale: Secondary | ICD-10-CM

## 2017-06-09 DIAGNOSIS — N179 Acute kidney failure, unspecified: Secondary | ICD-10-CM

## 2017-06-09 DIAGNOSIS — I76 Septic arterial embolism: Secondary | ICD-10-CM | POA: Diagnosis present

## 2017-06-09 DIAGNOSIS — E8779 Other fluid overload: Secondary | ICD-10-CM | POA: Diagnosis not present

## 2017-06-09 DIAGNOSIS — R4182 Altered mental status, unspecified: Secondary | ICD-10-CM | POA: Diagnosis present

## 2017-06-09 DIAGNOSIS — R471 Dysarthria and anarthria: Secondary | ICD-10-CM | POA: Diagnosis present

## 2017-06-09 DIAGNOSIS — M009 Pyogenic arthritis, unspecified: Secondary | ICD-10-CM | POA: Diagnosis present

## 2017-06-09 DIAGNOSIS — F1110 Opioid abuse, uncomplicated: Secondary | ICD-10-CM | POA: Diagnosis present

## 2017-06-09 DIAGNOSIS — F1910 Other psychoactive substance abuse, uncomplicated: Secondary | ICD-10-CM | POA: Diagnosis present

## 2017-06-09 DIAGNOSIS — A419 Sepsis, unspecified organism: Secondary | ICD-10-CM | POA: Diagnosis present

## 2017-06-09 DIAGNOSIS — I058 Other rheumatic mitral valve diseases: Secondary | ICD-10-CM | POA: Diagnosis present

## 2017-06-09 DIAGNOSIS — E440 Moderate protein-calorie malnutrition: Secondary | ICD-10-CM | POA: Diagnosis present

## 2017-06-09 DIAGNOSIS — G9340 Encephalopathy, unspecified: Secondary | ICD-10-CM | POA: Diagnosis present

## 2017-06-09 DIAGNOSIS — E876 Hypokalemia: Secondary | ICD-10-CM | POA: Diagnosis not present

## 2017-06-09 DIAGNOSIS — R7881 Bacteremia: Secondary | ICD-10-CM

## 2017-06-09 DIAGNOSIS — E874 Mixed disorder of acid-base balance: Secondary | ICD-10-CM | POA: Diagnosis present

## 2017-06-09 DIAGNOSIS — R402212 Coma scale, best verbal response, none, at arrival to emergency department: Secondary | ICD-10-CM | POA: Diagnosis present

## 2017-06-09 DIAGNOSIS — I10 Essential (primary) hypertension: Secondary | ICD-10-CM | POA: Diagnosis present

## 2017-06-09 DIAGNOSIS — G003 Staphylococcal meningitis: Secondary | ICD-10-CM | POA: Diagnosis present

## 2017-06-09 DIAGNOSIS — A4101 Sepsis due to Methicillin susceptible Staphylococcus aureus: Secondary | ICD-10-CM | POA: Diagnosis present

## 2017-06-09 DIAGNOSIS — R748 Abnormal levels of other serum enzymes: Secondary | ICD-10-CM | POA: Diagnosis present

## 2017-06-09 DIAGNOSIS — I639 Cerebral infarction, unspecified: Secondary | ICD-10-CM

## 2017-06-09 DIAGNOSIS — R1310 Dysphagia, unspecified: Secondary | ICD-10-CM | POA: Diagnosis present

## 2017-06-09 DIAGNOSIS — I330 Acute and subacute infective endocarditis: Secondary | ICD-10-CM | POA: Diagnosis present

## 2017-06-09 DIAGNOSIS — K921 Melena: Secondary | ICD-10-CM | POA: Diagnosis not present

## 2017-06-09 DIAGNOSIS — D649 Anemia, unspecified: Secondary | ICD-10-CM | POA: Diagnosis present

## 2017-06-09 DIAGNOSIS — R402352 Coma scale, best motor response, localizes pain, at arrival to emergency department: Secondary | ICD-10-CM | POA: Diagnosis present

## 2017-06-09 DIAGNOSIS — R402112 Coma scale, eyes open, never, at arrival to emergency department: Secondary | ICD-10-CM | POA: Diagnosis present

## 2017-06-09 DIAGNOSIS — I340 Nonrheumatic mitral (valve) insufficiency: Secondary | ICD-10-CM

## 2017-06-09 DIAGNOSIS — I059 Rheumatic mitral valve disease, unspecified: Secondary | ICD-10-CM | POA: Diagnosis present

## 2017-06-09 DIAGNOSIS — E871 Hypo-osmolality and hyponatremia: Secondary | ICD-10-CM | POA: Diagnosis present

## 2017-06-09 DIAGNOSIS — E875 Hyperkalemia: Secondary | ICD-10-CM | POA: Diagnosis present

## 2017-06-09 DIAGNOSIS — G9341 Metabolic encephalopathy: Secondary | ICD-10-CM | POA: Diagnosis present

## 2017-06-09 DIAGNOSIS — G009 Bacterial meningitis, unspecified: Secondary | ICD-10-CM | POA: Diagnosis present

## 2017-06-09 DIAGNOSIS — G8194 Hemiplegia, unspecified affecting left nondominant side: Secondary | ICD-10-CM | POA: Diagnosis present

## 2017-06-09 DIAGNOSIS — D696 Thrombocytopenia, unspecified: Secondary | ICD-10-CM | POA: Diagnosis present

## 2017-06-09 DIAGNOSIS — B9561 Methicillin susceptible Staphylococcus aureus infection as the cause of diseases classified elsewhere: Secondary | ICD-10-CM | POA: Diagnosis present

## 2017-06-09 DIAGNOSIS — I6340 Cerebral infarction due to embolism of unspecified cerebral artery: Secondary | ICD-10-CM | POA: Diagnosis present

## 2017-06-09 DIAGNOSIS — K089 Disorder of teeth and supporting structures, unspecified: Secondary | ICD-10-CM

## 2017-06-09 LAB — PROCALCITONIN
PROCALCITONIN: 81.75 ng/mL
Procalcitonin: 69.89 ng/mL

## 2017-06-09 LAB — I-STAT ARTERIAL BLOOD GAS, ED
Acid-base deficit: 2 mmol/L (ref 0.0–2.0)
BICARBONATE: 19.9 mmol/L — AB (ref 20.0–28.0)
O2 Saturation: 99 %
PCO2 ART: 25.5 mmHg — AB (ref 32.0–48.0)
PO2 ART: 138 mmHg — AB (ref 83.0–108.0)
TCO2: 21 mmol/L — AB (ref 22–32)
pH, Arterial: 7.5 — ABNORMAL HIGH (ref 7.350–7.450)

## 2017-06-09 LAB — GLUCOSE, CAPILLARY
GLUCOSE-CAPILLARY: 121 mg/dL — AB (ref 65–99)
GLUCOSE-CAPILLARY: 92 mg/dL (ref 65–99)
GLUCOSE-CAPILLARY: 61 mg/dL — AB (ref 65–99)
GLUCOSE-CAPILLARY: 132 mg/dL — AB (ref 65–99)
GLUCOSE-CAPILLARY: 173 mg/dL — AB (ref 65–99)
GLUCOSE-CAPILLARY: 155 mg/dL — AB (ref 65–99)
Glucose-Capillary: 91 mg/dL (ref 65–99)

## 2017-06-09 LAB — RESPIRATORY PANEL BY PCR
Adenovirus: NOT DETECTED
BORDETELLA PERTUSSIS-RVPCR: NOT DETECTED
CHLAMYDOPHILA PNEUMONIAE-RVPPCR: NOT DETECTED
CORONAVIRUS 229E-RVPPCR: NOT DETECTED
Coronavirus HKU1: NOT DETECTED
Coronavirus NL63: NOT DETECTED
Coronavirus OC43: NOT DETECTED
INFLUENZA B-RVPPCR: NOT DETECTED
Influenza A: NOT DETECTED
METAPNEUMOVIRUS-RVPPCR: NOT DETECTED
Mycoplasma pneumoniae: NOT DETECTED
PARAINFLUENZA VIRUS 2-RVPPCR: NOT DETECTED
Parainfluenza Virus 1: NOT DETECTED
Parainfluenza Virus 3: NOT DETECTED
Parainfluenza Virus 4: NOT DETECTED
RESPIRATORY SYNCYTIAL VIRUS-RVPPCR: NOT DETECTED
RHINOVIRUS / ENTEROVIRUS - RVPPCR: NOT DETECTED

## 2017-06-09 LAB — BLOOD CULTURE ID PANEL (REFLEXED)
Acinetobacter baumannii: NOT DETECTED
CANDIDA ALBICANS: NOT DETECTED
CANDIDA PARAPSILOSIS: NOT DETECTED
CANDIDA TROPICALIS: NOT DETECTED
Candida glabrata: NOT DETECTED
Candida krusei: NOT DETECTED
Enterobacter cloacae complex: NOT DETECTED
Enterobacteriaceae species: NOT DETECTED
Enterococcus species: NOT DETECTED
Escherichia coli: NOT DETECTED
HAEMOPHILUS INFLUENZAE: NOT DETECTED
KLEBSIELLA OXYTOCA: NOT DETECTED
KLEBSIELLA PNEUMONIAE: NOT DETECTED
Listeria monocytogenes: NOT DETECTED
METHICILLIN RESISTANCE: NOT DETECTED
Neisseria meningitidis: NOT DETECTED
Proteus species: NOT DETECTED
Pseudomonas aeruginosa: NOT DETECTED
SERRATIA MARCESCENS: NOT DETECTED
STAPHYLOCOCCUS AUREUS BCID: DETECTED — AB
STAPHYLOCOCCUS SPECIES: DETECTED — AB
STREPTOCOCCUS PNEUMONIAE: NOT DETECTED
STREPTOCOCCUS SPECIES: NOT DETECTED
Streptococcus agalactiae: NOT DETECTED
Streptococcus pyogenes: NOT DETECTED

## 2017-06-09 LAB — HEPATIC FUNCTION PANEL
ALT: 101 U/L — ABNORMAL HIGH (ref 17–63)
AST: 204 U/L — ABNORMAL HIGH (ref 15–41)
Albumin: 1.4 g/dL — ABNORMAL LOW (ref 3.5–5.0)
Alkaline Phosphatase: 92 U/L (ref 38–126)
BILIRUBIN DIRECT: 0.4 mg/dL (ref 0.1–0.5)
BILIRUBIN INDIRECT: 0.7 mg/dL (ref 0.3–0.9)
BILIRUBIN TOTAL: 1.1 mg/dL (ref 0.3–1.2)
Total Protein: 4.7 g/dL — ABNORMAL LOW (ref 6.5–8.1)

## 2017-06-09 LAB — BASIC METABOLIC PANEL
Anion gap: 13 (ref 5–15)
BUN: 82 mg/dL — AB (ref 6–20)
CALCIUM: 6.7 mg/dL — AB (ref 8.9–10.3)
CO2: 20 mmol/L — AB (ref 22–32)
CREATININE: 2.65 mg/dL — AB (ref 0.61–1.24)
Chloride: 98 mmol/L — ABNORMAL LOW (ref 101–111)
GFR calc non Af Amer: 26 mL/min — ABNORMAL LOW (ref >60)
GFR, EST AFRICAN AMERICAN: 30 mL/min — AB (ref >60)
Glucose, Bld: 131 mg/dL — ABNORMAL HIGH (ref 65–99)
Potassium: 3.6 mmol/L (ref 3.5–5.1)
SODIUM: 131 mmol/L — AB (ref 135–145)

## 2017-06-09 LAB — CSF CELL COUNT WITH DIFFERENTIAL
EOS CSF: 0 % (ref 0–1)
EOS CSF: 0 % (ref 0–1)
LYMPHS CSF: 3 % — AB (ref 40–80)
LYMPHS CSF: 4 % — AB (ref 40–80)
MONOCYTE-MACROPHAGE-SPINAL FLUID: 12 % — AB (ref 15–45)
Monocyte-Macrophage-Spinal Fluid: 6 % — ABNORMAL LOW (ref 15–45)
RBC Count, CSF: 1 /mm3 — ABNORMAL HIGH
RBC Count, CSF: 8 /mm3 — ABNORMAL HIGH
SEGMENTED NEUTROPHILS-CSF: 90 % — AB (ref 0–6)
Segmented Neutrophils-CSF: 85 % — ABNORMAL HIGH (ref 0–6)
TUBE #: 1
TUBE #: 4
WBC, CSF: 119 /mm3 (ref 0–5)
WBC, CSF: 480 /mm3 (ref 0–5)

## 2017-06-09 LAB — CBC
HCT: 33.2 % — ABNORMAL LOW (ref 39.0–52.0)
Hemoglobin: 11.5 g/dL — ABNORMAL LOW (ref 13.0–17.0)
MCH: 28.3 pg (ref 26.0–34.0)
MCHC: 34.6 g/dL (ref 30.0–36.0)
MCV: 81.6 fL (ref 78.0–100.0)
PLATELETS: 75 10*3/uL — AB (ref 150–400)
RBC: 4.07 MIL/uL — AB (ref 4.22–5.81)
RDW: 14 % (ref 11.5–15.5)
WBC: 20.6 10*3/uL — AB (ref 4.0–10.5)

## 2017-06-09 LAB — TROPONIN I
TROPONIN I: 0.22 ng/mL — AB (ref <0.03)
TROPONIN I: 0.36 ng/mL — AB (ref <0.03)
Troponin I: 0.29 ng/mL (ref <0.03)
Troponin I: 0.24 ng/mL (ref <0.03)

## 2017-06-09 LAB — COMPREHENSIVE METABOLIC PANEL
ALK PHOS: 105 U/L (ref 38–126)
ALT: 98 U/L — ABNORMAL HIGH (ref 17–63)
ANION GAP: 15 (ref 5–15)
AST: 205 U/L — ABNORMAL HIGH (ref 15–41)
Albumin: 1.6 g/dL — ABNORMAL LOW (ref 3.5–5.0)
BILIRUBIN TOTAL: 1.4 mg/dL — AB (ref 0.3–1.2)
BUN: 84 mg/dL — ABNORMAL HIGH (ref 6–20)
CALCIUM: 6.6 mg/dL — AB (ref 8.9–10.3)
CO2: 18 mmol/L — ABNORMAL LOW (ref 22–32)
Chloride: 96 mmol/L — ABNORMAL LOW (ref 101–111)
Creatinine, Ser: 2.94 mg/dL — ABNORMAL HIGH (ref 0.61–1.24)
GFR, EST AFRICAN AMERICAN: 27 mL/min — AB (ref >60)
GFR, EST NON AFRICAN AMERICAN: 23 mL/min — AB (ref >60)
Glucose, Bld: 140 mg/dL — ABNORMAL HIGH (ref 65–99)
POTASSIUM: 3.8 mmol/L (ref 3.5–5.1)
Sodium: 129 mmol/L — ABNORMAL LOW (ref 135–145)
TOTAL PROTEIN: 5.1 g/dL — AB (ref 6.5–8.1)

## 2017-06-09 LAB — LACTIC ACID, PLASMA
LACTIC ACID, VENOUS: 1.6 mmol/L (ref 0.5–1.9)
LACTIC ACID, VENOUS: 1.8 mmol/L (ref 0.5–1.9)

## 2017-06-09 LAB — MAGNESIUM: MAGNESIUM: 2.4 mg/dL (ref 1.7–2.4)

## 2017-06-09 LAB — PATHOLOGIST SMEAR REVIEW

## 2017-06-09 LAB — PROTEIN, CSF: TOTAL PROTEIN, CSF: 170 mg/dL — AB (ref 15–45)

## 2017-06-09 LAB — ECHOCARDIOGRAM COMPLETE
Height: 72 in
WEIGHTICAEL: 2479.73 [oz_av]

## 2017-06-09 LAB — GLUCOSE, CSF: Glucose, CSF: 59 mg/dL (ref 40–70)

## 2017-06-09 LAB — HIV ANTIBODY (ROUTINE TESTING W REFLEX): HIV SCREEN 4TH GENERATION: NONREACTIVE

## 2017-06-09 LAB — CK: CK TOTAL: 6435 U/L — AB (ref 49–397)

## 2017-06-09 LAB — PHOSPHORUS: Phosphorus: 6.5 mg/dL — ABNORMAL HIGH (ref 2.5–4.6)

## 2017-06-09 LAB — MRSA PCR SCREENING: MRSA BY PCR: NEGATIVE

## 2017-06-09 MED ORDER — CEFTRIAXONE SODIUM 2 G IJ SOLR
2.0000 g | Freq: Once | INTRAMUSCULAR | Status: AC
  Administered 2017-06-09: 2 g via INTRAVENOUS
  Filled 2017-06-09: qty 2

## 2017-06-09 MED ORDER — LACTATED RINGERS IV BOLUS (SEPSIS)
1000.0000 mL | Freq: Once | INTRAVENOUS | Status: AC
  Administered 2017-06-09: 1000 mL via INTRAVENOUS

## 2017-06-09 MED ORDER — CEFTRIAXONE SODIUM 2 G IJ SOLR
2.0000 g | Freq: Two times a day (BID) | INTRAMUSCULAR | Status: DC
  Filled 2017-06-09: qty 2

## 2017-06-09 MED ORDER — MORPHINE SULFATE (PF) 4 MG/ML IV SOLN
2.0000 mg | Freq: Once | INTRAVENOUS | Status: AC
  Administered 2017-06-09: 2 mg via INTRAVENOUS
  Filled 2017-06-09: qty 1

## 2017-06-09 MED ORDER — CHLORHEXIDINE GLUCONATE CLOTH 2 % EX PADS: 6.0000 | MEDICATED_PAD | Freq: Every day | CUTANEOUS | Status: DC

## 2017-06-09 MED ORDER — NAFCILLIN SODIUM 2 G IJ SOLR
2.0000 g | INTRAMUSCULAR | Status: DC
  Administered 2017-06-09 – 2017-06-18 (×54): 2 g via INTRAVENOUS
  Filled 2017-06-09 (×59): qty 2000

## 2017-06-09 MED ORDER — MORPHINE SULFATE (PF) 4 MG/ML IV SOLN
2.0000 mg | INTRAVENOUS | Status: DC | PRN
  Administered 2017-06-09 – 2017-06-10 (×4): 2 mg via INTRAVENOUS
  Filled 2017-06-09 (×4): qty 1

## 2017-06-09 MED ORDER — VITAMIN B-1 100 MG PO TABS
100.0000 mg | ORAL_TABLET | Freq: Every day | ORAL | Status: DC
  Administered 2017-06-09: 100 mg via ORAL
  Filled 2017-06-09 (×2): qty 1

## 2017-06-09 MED ORDER — SODIUM CHLORIDE 0.9 % IV SOLN
INTRAVENOUS | Status: DC
  Administered 2017-06-09 – 2017-06-13 (×8): via INTRAVENOUS

## 2017-06-09 MED ORDER — SODIUM CHLORIDE 0.9 % IV SOLN
250.0000 mL | INTRAVENOUS | Status: DC | PRN
  Administered 2017-06-20: 250 mL via INTRAVENOUS

## 2017-06-09 MED ORDER — PANTOPRAZOLE SODIUM 40 MG IV SOLR
40.0000 mg | INTRAVENOUS | Status: DC
  Administered 2017-06-09 – 2017-06-11 (×3): 40 mg via INTRAVENOUS
  Filled 2017-06-09 (×3): qty 40

## 2017-06-09 NOTE — Progress Notes (Signed)
Lab called and reported a AM Hgb of 3- phlebotomy likely drew from arm that was receiving IV bolus of NS at time.  Requested them to redraw it

## 2017-06-09 NOTE — Progress Notes (Signed)
NEUROHOSPITALISTS STROKE TEAM - DAILY PROGRESS NOTE   ADMISSION HISTORY: Marcus [name] is an 52 y.o. male who presented to the [facility] ED with AMS. Patient was last seen normal by his girlfriend at 12:00 on Sunday, but per his son, he had been appearing ill for several days prior. Sunday afternoon, girlfriend found him down on the ground on his right side, altered and unresponsive. He had been tachycardic for EMS with normal blood glucose. Received no medications prior to arrival to the ED.He does have a known history of opiate abuse.  SUBJECTIVE (INTERVAL HISTORY)  Family is at the bedside. Patient is found laying in bed in NAD. Family voices no new complaints. No new/acute events reported overnight. Imaging, lab results and POC reviewed with family at bedside.   OBJECTIVE Lab Results: CBC:  Recent Labs  Lab 06/08/17 1746 06/08/17 1757 06/09/17 0719  WBC 33.5*  --  20.6*  HGB 16.4 17.7* 11.5*  HCT 46.9 52.0 33.2*  MCV 82.4  --  81.6  PLT 134*  --  75*   BMP: Recent Labs  Lab 06/08/17 1746 06/08/17 1757 06/08/17 2337 06/09/17 0719  NA 126* 126* 129* 131*  K 5.1 5.5* 3.8 3.6  CL 85* 91* 96* 98*  CO2 16*  --  18* 20*  GLUCOSE 147* 152* 140* 131*  BUN 79* 77* 84* 82*  CREATININE 3.00* 3.00* 2.94* 2.65*  CALCIUM 7.9*  --  6.6* 6.7*  MG  --   --   --  2.4  PHOS  --   --   --  6.5*   Liver Function Tests:  Recent Labs  Lab 06/08/17 1746 06/08/17 2337 06/09/17 0719  AST 187* 205* 204*  ALT 107* 98* 101*  ALKPHOS 190* 105 92  BILITOT 2.2* 1.4* 1.1  PROT 7.4 5.1* 4.7*  ALBUMIN 2.3* 1.6* 1.4*   Recent Labs  Lab 06/08/17 1746  LIPASE 19   Thyroid Function Studies:  Recent Labs    06/08/17 1953  TSH 1.817   Cardiac Enzymes:  Recent Labs  Lab 06/08/17 1746 06/08/17 2337 06/09/17 0719 06/09/17 1224  CKTOTAL 791*  --  6,435*  --   TROPONINI  --  0.36* 0.29* 0.22*   Coagulation Studies:  Recent Labs   06/08/17 1746  INR 1.49   Urine Drug Screen:     Component Value Date/Time   LABOPIA POSITIVE (A) 06/08/2017 1724   COCAINSCRNUR POSITIVE (A) 06/08/2017 1724   LABBENZ POSITIVE (A) 06/08/2017 1724   AMPHETMU NONE DETECTED 06/08/2017 1724   THCU NONE DETECTED 06/08/2017 1724   LABBARB NONE DETECTED 06/08/2017 1724    PHYSICAL EXAM Temp:  [96.8 F (36 C)-102.7 F (39.3 C)] 98.6 F (37 C) (01/07 1400) Pulse Rate:  [80-129] 84 (01/07 0700) Resp:  [16-43] 28 (01/07 1400) BP: (93-171)/(37-158) 107/66 (01/07 1400) SpO2:  [97 %-100 %] 100 % (01/07 0700) Weight:  [70.3 kg (154 lb 15.7 oz)-77.1 kg (170 lb)] 70.3 kg (154 lb 15.7 oz) (01/07 0305) General - Well nourished, well developed young Caucasian male, in no apparent distress HEENT-  Normocephalic, Normal external eye/conjunctiva.  Normal external ears. Normal external nose, mucus membranes and septum.   Cardiovascular - Regular rate and rhythm  Respiratory - Lungs clear bilaterally. No wheezing. Abdomen - soft and non-tender, BS normal Extremities- Left knee joint effusion is warm to the touch with adjacent swelling. Mental Status: Opens eyes with sternal rub. Will attempt to communicate with constant stimulation. Oriented x 3. Minimal purposeful  movements but will move all extremities to noxious stimuli .  Cranial Nerves: II:  Blinks to threat only in temporal visual field of right eye. No blink to threat OS. Pupils small and slightly irregular bilaterally with sluggish reactivity.  III,IV, VI: Eyelids closed. Eyes are conjugate with weak/minimal roving EOM.  V,VII: Face symmetric with weak grimace to noxious.  VIII: hearing appear intacat IX,X: Unable to assess XI: Unable to assess XII: Tongue appear midline Motor/Sensory: Minimal 1-2/5 motor responses to noxious stimuli on Left. Right sided strength appears 4/5 throughout. Cerebellar/Gait: Unable to assess.   IMAGING: I have personally reviewed the radiological images below  and agree with the radiology interpretations. Ct Head Wo Contrast Result Date: 06/08/2017 IMPRESSION: Patchy hypodensities in the right cerebral hemisphere and right cerebellum concerning for acute/ early subacute infarcts. Consider MRI for further evaluation.   Mr Brain Wo Contrast Result Date: 06/09/2017 IMPRESSION: Numerous small foci of acute/early subacute infarction are present involving right-greater-than-left posterior hemispheres, basal ganglia, brainstem, and cerebellum. Multiple vascular territories suggests embolic phenomenon. Petechial hemorrhage is present within the the larger right parietal and right occipital infarcts. No mass effect.   Echocardiogram:                                               Study Conclusions - Left ventricle: The cavity size was normal. There was mild focal   basal hypertrophy of the septum. Systolic function was normal.   The estimated ejection fraction was in the range of 60% to 65%.   Wall motion was normal; there were no regional wall motion   abnormalities. Left ventricular diastolic function parameters   were normal. - Mitral valve: There was mild regurgitation. - Pulmonary arteries: Systolic pressure could not be accurately   estimated. Impressions:- There was a vegetation, consistent with endocarditis.    IMPRESSION: Marcus [name] is a 52 y.o. male with no notable PMH, history of opiate and cocaine use prior to admission, brought into hospital with reports of unresponsiveness and acute onset right sided weakness. MRI reveals:  Numerous small foci of acute/early subacute infarction right-greater-than-left posterior hemispheres, basal ganglia, brainstem, and cerebellum.  Petechial hemorrhage larger right parietal and right occipital infarcts.  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 More oriented and following commands on today's exam. No further stroke work up needed at this time. ECHO + for vegetation/endocarditis. Medical management per CCM and Cardiology. Will eventually need TEE. Will continue to hold ASA for now. No need for repeat imaging unless neuro exam worsens.  PLAN  06/09/2017: HOLD ASA for now, until repeat neuroimaging is stable & without evidence of bleeding Frequent neuro checks Telemetry monitoring PT/OT/SLP Consult PM & Rehab Consult Case Management /MSW Will likely need TEE once medically stable Ongoing aggressive stroke risk factor management Patient will be counseled to be compliant with his antithrombotic medications Patient will be counseled on Lifestyle modifications including, Diet, Exercise, and Stress Follow up with [facility] in 6 weeks  DYSPHAGIA: NPO until passes SLP swallow evaluation Aspiration Precautions in progress  MEDICAL ISSUES: CCM  Team MSSA bacteremia  Meningitis due to bacterimia Septic embolism (HCC) Endocarditis of mitral valve Swelling of left knee joint Sepsis (HCC) AKI (acute kidney injury) (HCC) Elevated liver enzymes Normocytic anemia Thrombocytopenia (HCC)  HYPERTENSION: Stable SBP goal of < 160. DBP goal of < 105.  Nicardipine drip, Labetolol PRN Long term BP goal normotensive. May slowly start B/P medications after 48 hours, if applicable Home Meds: NONE  HYPERLIPIDEMIA: No results found for: CHOL, TRIG, HDL, CHOLHDL, VLDL, LDLCALC   - PENDING Home Meds:  NONE LDL  goal < 70 Will start on daily statin, if necessary Continue statin at discharge  DIABETES: No results found for: HGBA1C -PENDING Recent Labs  Lab 06/09/17 0324 06/09/17 0910 06/09/17 1158  GLUCAP 121* 91 92  HgbA1c goal <  7.0 Currently on: NovoLog as needed Continue CBG monitoring and SSI to maintain glucose 140-180 mg/dl  TOBACCO ABUSE & POLYSUBSTANCE ABUSE UDS+ Current smoker Smoking cessation counseling provided Nicotine patch provided  Other Active Problems: Principal Problem:   Bacteremia due to methicillin susceptible Staphylococcus aureus (MSSA) Active Problems:   Encephalopathy   Meningitis due to bacteria   Septic embolism (HCC)   Sepsis (HCC)   AKI (acute kidney injury) (HCC)   Elevated liver enzymes   Normocytic anemia   Thrombocytopenia (HCC)   Polysubstance abuse (HCC)   Endocarditis of mitral valve   Swelling of left knee joint   Poor dentition    Hospital day # 0 VTE prophylaxis: SCD's  Diet : Diet clear liquid Room service appropriate? Yes; Fluid consistency: Thin   FAMILY UPDATES: No family at bedside  TEAM UPDATES: Oretha Milch, MD   Prior Home Stroke Medications:  No antithrombotic  Discharge Stroke Meds:  Please discharge patient on TBD   Disposition: 01-Home or Self Care Therapy Recs:               PENDING Home Equipment:         PENDING Follow Up:  Follow-up Information    Micki Riley, MD. Schedule an appointment as soon as possible for a visit in 6 week(s).   Specialties:  Neurology, Radiology Contact information: [address] [place] [address] [phone]          Patient, No Pcp Per -PCP Follow up in 1-2 weeks   Case Management aware of need   Assessment & plan discussed with with attending physician and they are in agreement.    Beryl Meager, ANP-C Stroke Neurology Team 06/09/2017 3:18 PM I have personally examined this patient, reviewed notes, independently viewed imaging studies, participated in medical decision making and plan of care.ROS completed by me personally and pertinent positives fully documented  I have made any additions or clarifications directly to the above note. Agree with note above. He presented with  encephalopathy secondary to multiple bilateral emboli and septicemia and bacterial meningitis from staph aureus infection likely with underlying endocarditis. Continue antibiotics as per infectious disease. Check transesophageal echocardiogram when medically stable enough for the procedure. Long discussion at the bedside with multiple family members and answered questions. This patient is critically ill and at significant risk of neurological worsening, death and care requires constant monitoring of vital signs, hemodynamics,respiratory and cardiac monitoring, extensive review of multiple databases, frequent neurological assessment, discussion with family, other specialists and medical decision making of high complexity.I have made any additions or clarifications directly to the above note.This critical care time does not reflect procedure time, or teaching time or supervisory time  of PA/NP/Med Resident etc but could involve care discussion time.  I spent 30 minutes of neurocritical care time  in the care of  this patient.      Delia HeadyPramod Ronnell Makarewicz, MD Medical Director [facility] Pager: [phone] 06/09/2017 5:23 PM  To contact Stroke Continuity provider, please refer to [email or web address]. After hours, contact General Neurology

## 2017-06-09 NOTE — Progress Notes (Signed)
eLink Physician-Brief Progress Note Patient Name: Marcus [name] DOB: 04/07/1966 MRN: [id number]   Date of Service  06/09/2017  HPI/Events of Note  Patient c/o pain - Received Morphine 2 mg IV at 7 PM.   eICU Interventions  Will order: 1. Morphine 2 mg IV Q 3 hours PRN pain.      Intervention Category Intermediate Interventions: Pain - evaluation and management  Sommer,Steven Eugene 06/09/2017, 11:02 PM

## 2017-06-09 NOTE — Progress Notes (Signed)
  Echocardiogram 2D Echocardiogram has been performed.  Roosvelt MaserLane, Meghna Hagmann F 06/09/2017, 11:54 AM

## 2017-06-09 NOTE — ED Notes (Signed)
Patient taken to ICU with RN and MRI tech.

## 2017-06-09 NOTE — Consult Note (Signed)
[facility]    Date of Admission:  06/08/2017   Total days of antibiotics 1              Reason for Consult: Automatic consultation for staph aureus bacteremia     Assessment: He has MSSA bacteremia complicated by mitral valve endocarditis, septic CNS emboli, meningitis and probable left septic knee.  We have changed him to high-dose IV nafcillin.  A third set of blood cultures was done late yesterday and is pending.  If that set is also positive he will need repeat blood cultures tomorrow.  Plan: 1. Continue nafcillin 2. Await results of repeat blood cultures 3. He needs orthopedic evaluation for his left knee  Principal Problem:   Bacteremia due to methicillin susceptible Staphylococcus aureus (MSSA) Active Problems:   Meningitis due to bacteria   Septic embolism (HCC)   Endocarditis of mitral valve   Swelling of left knee joint   Encephalopathy   Sepsis (HCC)   AKI (acute kidney injury) (HCC)   Elevated liver enzymes   Normocytic anemia   Thrombocytopenia (HCC)   Polysubstance abuse (HCC)   Poor dentition   Scheduled Meds: . pantoprazole (PROTONIX) IV  40 mg Intravenous Q24H  . thiamine  100 mg Oral Daily   Continuous Infusions: . sodium chloride    . sodium chloride 125 mL/hr at 06/09/17 1100  . nafcillin IV 2 g (06/09/17 1021)   PRN Meds:.sodium chloride  HPI: Marcus [name] is a 52 y.o. male who was found down and unresponsive by his girlfriend yesterday.  He was brought in by EMS and found to be febrile and septic with renal failure.  Brain MRI revealed multiple bilateral acute infarctions compatible with septic CNS emboli.  Lumbar puncture revealed findings compatible with acute bacterial meningitis.  Both admission blood cultures are growing MSSA and preliminary echocardiogram results suggest mitral valve vegetation.  He also has a large left knee effusion.  His urine drug screen was positive for opiates, cocaine and  benzodiazepines.   Review of Systems: Review of Systems  Unable to perform ROS: Mental acuity    No past medical history on file.  Social History   Tobacco Use  . Smoking status: Former Smoker    Packs/day: 1.00    Years: 10.00    Pack years: 10.00    Types: Cigarettes    Last attempt to quit: 11/04/2015    Years since quitting: 1.5  . Smokeless tobacco: Never Used  Substance Use Topics  . Alcohol use: Yes    Comment: occasionally   . Drug use: No    No family history on file. No Known Allergies  OBJECTIVE: Blood pressure 107/75, pulse 84, temperature 97.7 F (36.5 C), resp. rate (!) 27, height 6' (1.829 m), weight 154 lb 15.7 oz (70.3 kg), SpO2 100 %.  Physical Exam  Constitutional:  He is poorly responsive and does not follow commands.  HENT:  Mouth/Throat: No oropharyngeal exudate.  Many missing teeth.  Remaining teeth are in poor condition.  There is a bruise on his right cheek.  Eyes: Conjunctivae are normal.  Neck: Neck supple.  Cardiovascular:  He is tachycardic.  Very distant heart sounds.  Pulmonary/Chest: Effort normal. He has no wheezes. He has no rales.  Abdominal: Soft. He exhibits no distension.  Musculoskeletal:  His left knee is warm and swollen.    Lab Results Lab Results  Component Value Date   WBC  20.6 (H) 06/09/2017   HGB 11.5 (L) 06/09/2017   HCT 33.2 (L) 06/09/2017   MCV 81.6 06/09/2017   PLT 75 (L) 06/09/2017    Lab Results  Component Value Date   CREATININE 2.65 (H) 06/09/2017   BUN 82 (H) 06/09/2017   NA 131 (L) 06/09/2017   K 3.6 06/09/2017   CL 98 (L) 06/09/2017   CO2 20 (L) 06/09/2017    Lab Results  Component Value Date   ALT 101 (H) 06/09/2017   AST 204 (H) 06/09/2017   ALKPHOS 92 06/09/2017   BILITOT 1.1 06/09/2017     Microbiology: Recent Results (from the past 240 hour(s))  Culture, blood (routine x 2)     Status: None (Preliminary result)   Collection Time: 06/08/17  5:30 PM  Result Value Ref Range Status     Specimen Description BLOOD LEFT ARM  Final   Special Requests IN PEDIATRIC BOTTLE Blood Culture adequate volume  Final   Culture  Setup Time   Final    GRAM POSITIVE COCCI IN CLUSTERS IN PEDIATRIC BOTTLE CRITICAL VALUE NOTED.  VALUE IS CONSISTENT WITH PREVIOUSLY REPORTED AND CALLED VALUE.    Culture GRAM POSITIVE COCCI  Final   Report Status PENDING  Incomplete  Culture, blood (routine x 2)     Status: None (Preliminary result)   Collection Time: 06/08/17  5:46 PM  Result Value Ref Range Status   Specimen Description BLOOD RIGHT ANTECUBITAL  Final   Special Requests   Final    BOTTLES DRAWN AEROBIC AND ANAEROBIC Blood Culture adequate volume   Culture  Setup Time   Final    IN BOTH AEROBIC AND ANAEROBIC BOTTLES GRAM POSITIVE COCCI IN CLUSTERS CRITICAL RESULT CALLED TO, READ BACK BY AND VERIFIED WITH: J.LEDFORD,PHARMD [id number] 06/09/17 M.Nautia Lem    Culture NO GROWTH < 24 HOURS  Final   Report Status PENDING  Incomplete  Blood Culture ID Panel (Reflexed)     Status: Abnormal   Collection Time: 06/08/17  5:46 PM  Result Value Ref Range Status   Enterococcus species NOT DETECTED NOT DETECTED Final   Listeria monocytogenes NOT DETECTED NOT DETECTED Final   Staphylococcus species DETECTED (A) NOT DETECTED Final    Comment: CRITICAL RESULT CALLED TO, READ BACK BY AND VERIFIED WITH: J.LEDFORD,PHARMD [id number] 06/09/17 M.Sylwia Cuervo    Staphylococcus aureus DETECTED (A) NOT DETECTED Final    Comment: Methicillin (oxacillin) susceptible Staphylococcus aureus (MSSA). Preferred therapy is anti staphylococcal beta lactam antibiotic (Cefazolin or Nafcillin), unless clinically contraindicated. CRITICAL RESULT CALLED TO, READ BACK BY AND VERIFIED WITH: J.LEDFORD,PHARMD [id number] 06/09/17 M.Davarion Cuffee    Methicillin resistance NOT DETECTED NOT DETECTED Final   Streptococcus species NOT DETECTED NOT DETECTED Final   Streptococcus agalactiae NOT DETECTED NOT DETECTED Final   Streptococcus pneumoniae NOT  DETECTED NOT DETECTED Final   Streptococcus pyogenes NOT DETECTED NOT DETECTED Final   Acinetobacter baumannii NOT DETECTED NOT DETECTED Final   Enterobacteriaceae species NOT DETECTED NOT DETECTED Final   Enterobacter cloacae complex NOT DETECTED NOT DETECTED Final   Escherichia coli NOT DETECTED NOT DETECTED Final   Klebsiella oxytoca NOT DETECTED NOT DETECTED Final   Klebsiella pneumoniae NOT DETECTED NOT DETECTED Final   Proteus species NOT DETECTED NOT DETECTED Final   Serratia marcescens NOT DETECTED NOT DETECTED Final   Haemophilus influenzae NOT DETECTED NOT DETECTED Final   Neisseria meningitidis NOT DETECTED NOT DETECTED Final   Pseudomonas aeruginosa NOT DETECTED NOT DETECTED Final   Candida albicans NOT DETECTED NOT DETECTED Final  Candida glabrata NOT DETECTED NOT DETECTED Final   Candida krusei NOT DETECTED NOT DETECTED Final   Candida parapsilosis NOT DETECTED NOT DETECTED Final   Candida tropicalis NOT DETECTED NOT DETECTED Final  CSF culture     Status: None (Preliminary result)   Collection Time: 06/08/17 11:00 PM  Result Value Ref Range Status   Specimen Description CSF  Final   Special Requests Normal  Final   Culture NO GROWTH < 12 HOURS  Final   Report Status PENDING  Incomplete  Gram stain     Status: None   Collection Time: 06/08/17 11:00 PM  Result Value Ref Range Status   Specimen Description CSF  Final   Special Requests Normal  Final   Gram Stain   Final    CYTOSPIN SLIDE WBC PRESENT,BOTH PMN AND MONONUCLEAR NO ORGANISMS SEEN    Report Status 06/08/2017 FINAL  Final  MRSA PCR Screening     Status: None   Collection Time: 06/09/17  3:15 AM  Result Value Ref Range Status   MRSA by PCR NEGATIVE NEGATIVE Final    Comment:        The GeneXpert MRSA Assay (FDA approved for NASAL specimens only), is one component of a comprehensive MRSA colonization surveillance program. It is not intended to diagnose MRSA infection nor to guide or monitor  treatment for MRSA infections.     Cliffton Asters, MD [facility] [phone] pager   [phone] cell 06/09/2017, 12:01 PM

## 2017-06-09 NOTE — Progress Notes (Signed)
PULMONARY / CRITICAL CARE MEDICINE   Name: Marcus [name] MRN: [id number] DOB: 12/12/1965    ADMISSION DATE:  06/08/2017  CHIEF COMPLAINT: Altered mental status  HISTORY OF PRESENT ILLNESS:        This is a 52 year old who was found down.  He had CT is shown multiple defects in the right hemisphere and right cerebellum.  In addition he has a warm effusion of the right knee.  Blood cultures obtained last night are already growing staph aureus.  Was able to speak with family this morning, they deny a known history of IV drug abuse.  Toxicology screen on presentation was positive for cocaine opiates and benzodiazepines.  This morning he is extremely lethargic.  He will awaken and produce 1 or 2 sentences but only after noxious stimulation.  PAST MEDICAL HISTORY :  He  has no past medical history on file.  PAST SURGICAL HISTORY: He  has a past surgical history that includes Hip surgery; Ankle surgery; and Hip fracture surgery (Left, 1994).  No Known Allergies  No current facility-administered medications on file prior to encounter.    Current Outpatient Medications on File Prior to Encounter  Medication Sig  . acetaminophen (TYLENOL) 500 MG tablet Take 500-1,000 mg by mouth every 6 (six) hours as needed (for pain or headaches).  Marland Kitchen. ibuprofen (ADVIL,MOTRIN) 200 MG tablet Take 200-600 mg by mouth every 6 (six) hours as needed (for pain or headaches).   Marland Kitchen. amoxicillin-clavulanate (AUGMENTIN) 875-125 MG per tablet Take 1 tablet by mouth 2 (two) times daily. (Patient not taking: Reported on 06/08/2017)  . diazepam (VALIUM) 5 MG tablet Take 1 tablet (5 mg total) by mouth every 6 (six) hours as needed for muscle spasms. (Patient not taking: Reported on 06/08/2017)  . oxyCODONE-acetaminophen (PERCOCET/ROXICET) 5-325 MG tablet Take 1 tablet by mouth every 4 (four) hours as needed for severe pain (pain). (Patient not taking: Reported on 06/08/2017)    FAMILY HISTORY:  His has no family status information on  file.    SOCIAL HISTORY: He  reports that he quit smoking about 19 months ago. His smoking use included cigarettes. He has a 10.00 pack-year smoking history. he has never used smokeless tobacco. He reports that he drinks alcohol. He reports that he does not use drugs.  REVIEW OF SYSTEMS:   Unobtainable  SUBJECTIVE:  As above  VITAL SIGNS: BP 107/75   Pulse 84   Temp 97.7 F (36.5 C)   Resp (!) 27   Ht 6' (1.829 m)   Wt 154 lb 15.7 oz (70.3 kg)   SpO2 100%   BMI 21.02 kg/m   HEMODYNAMICS:    VENTILATOR SETTINGS:    INTAKE / OUTPUT: I/O last 3 completed shifts: In: 4245.8 [I.V.:4195.8; IV Piggyback:50] Out: 2285 [Urine:2285]  PHYSICAL EXAMINATION: General: Somewhat thin and unkept, lethargic but no overt distress Neuro: No response to voice, some speech in response to noxious stimuli but he rapidly falls to sleep again.  Pupils are equal at 2-3 mm and reactive, the face is symmetric.  He does follow some instructions, appears to be weak in the left upper and lower extremity.   HEENT: Teeth are in very poor repair Cardiovascular: S1 and S2 are regular after prolonged auscultation I do not appreciate a murmur, there is no gallop Lungs: Respirations are unlabored, there is symmetric air movement, no wheezes Abdomen: The abdomen is flat and soft without organomegaly masses tenderness guarding or rebound.  He is anicteric Musculoskeletal: There is a  large ballotable warm effusion of the left knee Skin: I do not appreciate any tracks  LABS:  BMET Recent Labs  Lab 06/08/17 1746 06/08/17 1757 06/08/17 2337 06/09/17 0719  NA 126* 126* 129* 131*  K 5.1 5.5* 3.8 3.6  CL 85* 91* 96* 98*  CO2 16*  --  18* 20*  BUN 79* 77* 84* 82*  CREATININE 3.00* 3.00* 2.94* 2.65*  GLUCOSE 147* 152* 140* 131*    Electrolytes Recent Labs  Lab 06/08/17 1746 06/08/17 2337 06/09/17 0719  CALCIUM 7.9* 6.6* 6.7*  MG  --   --  2.4  PHOS  --   --  6.5*    CBC Recent Labs  Lab  06/08/17 1746 06/08/17 1757 06/09/17 0719  WBC 33.5*  --  20.6*  HGB 16.4 17.7* 11.5*  HCT 46.9 52.0 33.2*  PLT 134*  --  75*    Coag's Recent Labs  Lab 06/08/17 1746  INR 1.49    Sepsis Markers Recent Labs  Lab 06/08/17 1952 06/08/17 2338 06/09/17 0014 06/09/17 0719  LATICACIDVEN 7.09* 1.8  --  1.6  PROCALCITON  --   --  81.75 69.89    ABG Recent Labs  Lab 06/09/17 0046  PHART 7.500*  PCO2ART 25.5*  PO2ART 138.0*    Liver Enzymes Recent Labs  Lab 06/08/17 1746 06/08/17 2337 06/09/17 0719  AST 187* 205* 204*  ALT 107* 98* 101*  ALKPHOS 190* 105 92  BILITOT 2.2* 1.4* 1.1  ALBUMIN 2.3* 1.6* 1.4*    Cardiac Enzymes Recent Labs  Lab 06/08/17 2337 06/09/17 0719  TROPONINI 0.36* 0.29*    Glucose Recent Labs  Lab 06/09/17 0324 06/09/17 0910  GLUCAP 121* 91    Imaging Ct Head Wo Contrast  Result Date: 06/08/2017 CLINICAL DATA:  Found unresponsive. EXAM: CT HEAD WITHOUT CONTRAST TECHNIQUE: Contiguous axial images were obtained from the base of the skull through the vertex without intravenous contrast. COMPARISON:  None. FINDINGS: Brain: There is a small focus of low density in the superior right cerebellar hemisphere. There are additional small areas of patchy low-density which involve both cortex and white matter in the medial right parieto-occipital region and in the posterior right frontal lobe. These are concerning for acute or early subacute infarcts, with trace petechial hemorrhage not excluded though without a discrete parenchymal hematoma. No subarachnoid hemorrhage or extra-axial fluid collection is seen, and there is no midline shift. The ventricles are normal in size. Vascular: Calcified atherosclerosis at the skullbase. No hyperdense vessel. Skull: No fracture or focal osseous lesion. Sinuses/Orbits: Visualized paranasal sinuses and mastoid air cells are clear. Orbits are unremarkable. Other: None. IMPRESSION: Patchy hypodensities in the right  cerebral hemisphere and right cerebellum concerning for acute/ early subacute infarcts. Consider MRI for further evaluation. Electronically Signed   By: Sebastian Ache M.D.   On: 06/08/2017 21:02   Mr Brain Wo Contrast  Result Date: 06/09/2017 CLINICAL DATA:  51 y/o M; found unresponsive with altered mental status. History of opiate abuse. EXAM: MRI HEAD WITHOUT CONTRAST TECHNIQUE: Multiplanar, multiecho pulse sequences of the brain and surrounding structures were obtained without intravenous contrast. COMPARISON:  06/08/2016 CT head. FINDINGS: Motion degradation on multiple sequences. Brain: Numerous small foci of reduced diffusion are present within right posterior frontal lobe, bilateral parietal lobes, right temporal lobe, right greater than left occipital lobes, splenium of corpus callosum, right thalamus, right midbrain, left paramedian pons, and scattered throughout right greater than left cerebellar hemispheres. Foci of reduced diffusion are compatible with acute/early  subacute infarction. Multiple vascular territories suggests embolic phenomenon. Infarctions are associated with T2 FLAIR hyperintense signal abnormality. No mass effect. Susceptibility weighted sequences are motion degraded, but within the larger right parietal and occipital lesions there is hypointensity without CT correlate indicating petechial hemorrhage. No hydrocephalus, extra-axial collection, effacement of basilar cisterns, or significant mass effect. Vascular: Normal flow voids. Skull and upper cervical spine: Normal marrow signal. Sinuses/Orbits: Negative. Other: None. IMPRESSION: Numerous small foci of acute/early subacute infarction are present involving right-greater-than-left posterior hemispheres, basal ganglia, brainstem, and cerebellum. Multiple vascular territories suggests embolic phenomenon. Petechial hemorrhage is present within the the larger right parietal and right occipital infarcts. No mass effect. These results will  be called to the ordering clinician or representative by the Radiologist Assistant, and communication documented in the PACS or zVision Dashboard. Electronically Signed   By: Mitzi Hansen M.D.   On: 06/09/2017 03:18   US Abdomen Complete  Result Date: 06/08/2017 CLINICAL DATA:  Sepsis with elevated liver function tests and bilirubin today. EXAM: ABDOMEN ULTRASOUND COMPLETE COMPARISON:  None. FINDINGS: Gallbladder: Intraluminal tumefactive biliary sludge is noted within the gallbladder lumen. No wall thickening is identified. No pericholecystic fluid is noted. No sonographic Murphy sign noted by sonographer. Common bile duct: Diameter: 4.9 mm and within normal limits in caliber. No choledocholithiasis. Liver: No focal lesion identified. Within normal limits in parenchymal echogenicity. Portal vein is patent on color Doppler imaging with normal direction of blood flow towards the liver. IVC: No abnormality visualized. Pancreas: Visualized portion unremarkable. Spleen: 11.1 x 13.2 x 5.8 cm (volume = 440 cm^3) without space-occupying mass. Right Kidney: Length: 11.5 cm. Echogenicity within normal limits. No mass or hydronephrosis visualized. Left Kidney: Length: 12.1 cm. Echogenicity within normal limits. No mass or hydronephrosis visualized. Abdominal aorta: No aneurysm visualized. Mild aortoiliac atherosclerosis. Other findings: None. IMPRESSION: 1. Biliary sludge noted within the gallbladder without acute findings of cholecystitis. No biliary dilatation. 2. Mild splenomegaly. Electronically Signed   By: Tollie Eth M.D.   On: 06/08/2017 21:52   Dg Chest Port 1 View  Result Date: 06/08/2017 CLINICAL DATA:  Altered mental status EXAM: PORTABLE CHEST 1 VIEW COMPARISON:  02/15/2005 FINDINGS: The heart size and mediastinal contours are within normal limits. Both lungs are clear. The visualized skeletal structures are unremarkable. IMPRESSION: No active disease. Electronically Signed   By: Tollie Eth  M.D.   On: 06/08/2017 18:18   Dg Knee Complete 4 Views Left  Result Date: 06/09/2017 CLINICAL DATA:  52 y/o  M; knee injury with effusion. EXAM: LEFT KNEE - COMPLETE 4+ VIEW COMPARISON:  None. FINDINGS: No acute fracture or dislocation identified. Borderline patella alta. Mild medial and moderate lateral femorotibial compartment joint space narrowing and tricompartmental osteophytes. Large joint effusion. IMPRESSION: 1. Large joint effusion.  No acute fracture identified. 2. Osteoarthrosis of the knee greatest in lateral femorotibial compartment. 3. Borderline patella alta. Electronically Signed   By: Mitzi Hansen M.D.   On: 06/09/2017 00:16       CULTURES: Blood culture obtained last night is growing staph aureus  ANTIBIOTICS: Rocephin and nafcillin  DISCUSSION: This is a 52 year old who was found unresponsive who has multiple defects in the right hemisphere and right cerebellum and a warm ballotable effusion in the left knee.  He is growing staph aureus from the blood and almost certainly this represents endocarditis.  ASSESSMENT / PLAN:  PULMONARY A: No active issue, there are no overt septic emboli on the chest x-ray  CARDIOVASCULAR A: Hemodynamically stable for  the present.  I am awaiting an echocardiogram  RENAL creatinine is elevated.  I do not know the baseline.  I am suspicious this is a consequence of endocarditis.  Ensuring adequate hydration.  GASTROINTESTINAL A: Prophylaxis with Protonix has been ordered.   INFECTIOUS A: Staph bacteremia currently being covered with a combination of nafcillin and vancomycin.  Almost certainly this represents endocarditis.  An echocardiogram is pending.  NEUROLOGIC A: Multiple emboli in different vascular territories with staph bacteremia almost certainly representing endocarditis.  Appropriate therapy is in place   Penny Pia, MD Pulmonary and Critical Care [facility] Pager: [phone]  06/09/2017,  10:05 AM

## 2017-06-09 NOTE — ED Notes (Signed)
To MRI with RN

## 2017-06-09 NOTE — Consult Note (Signed)
Referring Physician: Dr. Elsworth Soho    Chief Complaint: Altered mental status.   HPI: Marcus [name] is an 52 y.o. male who presented to the [facility] ED with AMS. Patient was last seen normal by his girlfriend at 12:00 on Sunday, but per his son, he had been appearing ill for several days prior. Sunday afternoon, girlfriend found him down on the ground on his right side, altered and unresponsive.  He had been tachycardic for EMS with normal blood glucose. Received no medications prior to arrival to the ED. He does have a known history of opiate abuse.  CT head revealed patchy hypodensities in the right cerebral hemisphere and right cerebellum concerning for acute/ early subacute infarcts  MRI brain was then obtained, revealing the following: Numerous small foci of acute/early subacute infarction are present involving right-greater-than-left posterior hemispheres, basal ganglia, brainstem, and cerebellum. Multiple vascular territories suggests embolic phenomenon. Petechial hemorrhage is present within the larger right parietal and right occipital infarcts. No mass effect.  EKG without atrial fibrillation.   Plain films of left knee: Large joint effusion.  CXR shows no active disease detectable with this modality.     No past medical history on file.  Past Surgical History:  Procedure Laterality Date  . ANKLE SURGERY    . HIP FRACTURE SURGERY Left 1994  . HIP SURGERY      No family history on file. Social History:  reports that he quit smoking about 19 months ago. His smoking use included cigarettes. He has a 10.00 pack-year smoking history. he has never used smokeless tobacco. He reports that he drinks alcohol. He reports that he does not use drugs.  Allergies: No Known Allergies  Medications:  Prior to Admission:  Medications Prior to Admission  Medication Sig Dispense Refill Last Dose  . acetaminophen (TYLENOL) 500 MG tablet Take 500-1,000 mg by mouth every 6 (six) hours as needed (for pain or  headaches).   PRN at PRN  . ibuprofen (ADVIL,MOTRIN) 200 MG tablet Take 200-600 mg by mouth every 6 (six) hours as needed (for pain or headaches).    PRN at PRN  . amoxicillin-clavulanate (AUGMENTIN) 875-125 MG per tablet Take 1 tablet by mouth 2 (two) times daily. (Patient not taking: Reported on 06/08/2017) 23 tablet 0 Not Taking at Unknown time  . diazepam (VALIUM) 5 MG tablet Take 1 tablet (5 mg total) by mouth every 6 (six) hours as needed for muscle spasms. (Patient not taking: Reported on 06/08/2017) 10 tablet 0 Not Taking at Unknown time  . oxyCODONE-acetaminophen (PERCOCET/ROXICET) 5-325 MG tablet Take 1 tablet by mouth every 4 (four) hours as needed for severe pain (pain). (Patient not taking: Reported on 06/08/2017) 12 tablet 0 Not Taking at Unknown time   Scheduled:  Continuous: . sodium chloride    . sodium chloride 125 mL/hr at 06/09/17 0500  . cefTRIAXone (ROCEPHIN)  IV    . piperacillin-tazobactam (ZOSYN)  IV 3.375 g (06/09/17 0500)  . vancomycin     HRC:BULAGT chloride  ROS: Unable to obtain due to AMS.   Physical Examination: Blood pressure 105/75, pulse 96, temperature 98.2 F (36.8 C), resp. rate (!) 29, weight 77.1 kg (170 lb), SpO2 97 %.  General: Thin and frail appearing. Appears acutely ill.  HEENT: [place]/AT. Several missing and decayed teeth. Lungs: Tachypneic Ext: Left joint effusion is warm to the touch with adjacent swelling.   Neurologic Examination: Mental Status: Severe obtundation. Opens eyes partially and briefly to sternal rub. Occasionally moans in response to  noxious stimuli. No verbal output. Does not attempt to communicate. No purposeful movements with minimal withdrawal to noxious.  Cranial Nerves: II:  Blinks to threat only in temporal visual field of right eye. No blink to threat OS. Pupils small and slightly irregular bilaterally with sluggish reactivity.  III,IV, VI: Eyelids closed. Eyes are conjugate with weak/minimal roving EOM.  V,VII: Face  symmetric with weak grimace to noxious. Unable to formally test facial sensation. VIII: No response to auditory stimuli IX,X: Unable to assess XI: Unable to assess XII: Does not protrude tongue to command Motor/Sensory: Minimal 1-2/5 motor responses to noxious stimuli in all 4 extremities. Moves somewhat less on the left than on the right.  Deep Tendon Reflexes:  1+ bilateral brachioradialis and right patella. Deferred left patella due to effusion. Toes mute bilaterally.  Cerebellar/Gait: Unable to assess.    Results for orders placed or performed during the hospital encounter of 06/08/17 (from the past 48 hour(s))  Acetaminophen level     Status: Abnormal   Collection Time: 06/08/17  5:24 PM  Result Value Ref Range   Acetaminophen (Tylenol), Serum <10 (L) 10 - 30 ug/mL    Comment:        THERAPEUTIC CONCENTRATIONS VARY SIGNIFICANTLY. A RANGE OF 10-30 ug/mL MAY BE AN EFFECTIVE CONCENTRATION FOR MANY PATIENTS. HOWEVER, SOME ARE BEST TREATED AT CONCENTRATIONS OUTSIDE THIS RANGE. ACETAMINOPHEN CONCENTRATIONS >150 ug/mL AT 4 HOURS AFTER INGESTION AND >50 ug/mL AT 12 HOURS AFTER INGESTION ARE OFTEN ASSOCIATED WITH TOXIC REACTIONS.   Ethanol     Status: None   Collection Time: 06/08/17  5:24 PM  Result Value Ref Range   Alcohol, Ethyl (B) <10 <10 mg/dL    Comment:        LOWEST DETECTABLE LIMIT FOR SERUM ALCOHOL IS 10 mg/dL FOR MEDICAL PURPOSES ONLY   Salicylate level     Status: None   Collection Time: 06/08/17  5:24 PM  Result Value Ref Range   Salicylate Lvl <3.1 2.8 - 30.0 mg/dL  Urinalysis, Routine w reflex microscopic     Status: Abnormal   Collection Time: 06/08/17  5:24 PM  Result Value Ref Range   Color, Urine AMBER (A) YELLOW    Comment: BIOCHEMICALS MAY BE AFFECTED BY COLOR   APPearance CLOUDY (A) CLEAR   Specific Gravity, Urine 1.016 1.005 - 1.030   pH 5.0 5.0 - 8.0   Glucose, UA NEGATIVE NEGATIVE mg/dL   Hgb urine dipstick LARGE (A) NEGATIVE   Bilirubin  Urine NEGATIVE NEGATIVE   Ketones, ur NEGATIVE NEGATIVE mg/dL   Protein, ur NEGATIVE NEGATIVE mg/dL   Nitrite NEGATIVE NEGATIVE   Leukocytes, UA NEGATIVE NEGATIVE   RBC / HPF 6-30 0 - 5 RBC/hpf   WBC, UA 0-5 0 - 5 WBC/hpf   Bacteria, UA RARE (A) NONE SEEN   Squamous Epithelial / LPF 0-5 (A) NONE SEEN   Mucus PRESENT    Sperm, UA PRESENT   Urine rapid drug screen (hosp performed)     Status: Abnormal   Collection Time: 06/08/17  5:24 PM  Result Value Ref Range   Opiates POSITIVE (A) NONE DETECTED   Cocaine POSITIVE (A) NONE DETECTED   Benzodiazepines POSITIVE (A) NONE DETECTED   Amphetamines NONE DETECTED NONE DETECTED   Tetrahydrocannabinol NONE DETECTED NONE DETECTED   Barbiturates NONE DETECTED NONE DETECTED    Comment: (NOTE) DRUG SCREEN FOR MEDICAL PURPOSES ONLY.  IF CONFIRMATION IS NEEDED FOR ANY PURPOSE, NOTIFY LAB WITHIN 5 DAYS. LOWEST DETECTABLE LIMITS FOR URINE  DRUG SCREEN Drug Class                     Cutoff (ng/mL) Amphetamine and metabolites    1000 Barbiturate and metabolites    200 Benzodiazepine                 956 Tricyclics and metabolites     300 Opiates and metabolites        300 Cocaine and metabolites        300 THC                            50   CK     Status: Abnormal   Collection Time: 06/08/17  5:46 PM  Result Value Ref Range   Total CK 791 (H) 49 - 397 U/L    Comment: RESULTS CONFIRMED BY MANUAL DILUTION  Comprehensive metabolic panel     Status: Abnormal   Collection Time: 06/08/17  5:46 PM  Result Value Ref Range   Sodium 126 (L) 135 - 145 mmol/L   Potassium 5.1 3.5 - 5.1 mmol/L   Chloride 85 (L) 101 - 111 mmol/L   CO2 16 (L) 22 - 32 mmol/L   Glucose, Bld 147 (H) 65 - 99 mg/dL   BUN 79 (H) 6 - 20 mg/dL   Creatinine, Ser 3.00 (H) 0.61 - 1.24 mg/dL   Calcium 7.9 (L) 8.9 - 10.3 mg/dL   Total Protein 7.4 6.5 - 8.1 g/dL   Albumin 2.3 (L) 3.5 - 5.0 g/dL   AST 187 (H) 15 - 41 U/L   ALT 107 (H) 17 - 63 U/L   Alkaline Phosphatase 190 (H) 38  - 126 U/L   Total Bilirubin 2.2 (H) 0.3 - 1.2 mg/dL   GFR calc non Af Amer 23 (L) >60 mL/min   GFR calc Af Amer 26 (L) >60 mL/min    Comment: (NOTE) The eGFR has been calculated using the CKD EPI equation. This calculation has not been validated in all clinical situations. eGFR's persistently <60 mL/min signify possible Chronic Kidney Disease.    Anion gap 25 (H) 5 - 15    Comment: QNS TO REPEAT  Lipase, blood     Status: None   Collection Time: 06/08/17  5:46 PM  Result Value Ref Range   Lipase 19 11 - 51 U/L  CBC with Differential     Status: Abnormal   Collection Time: 06/08/17  5:46 PM  Result Value Ref Range   WBC 33.5 (H) 4.0 - 10.5 K/uL   RBC 5.69 4.22 - 5.81 MIL/uL   Hemoglobin 16.4 13.0 - 17.0 g/dL   HCT 46.9 39.0 - 52.0 %   MCV 82.4 78.0 - 100.0 fL   MCH 28.8 26.0 - 34.0 pg   MCHC 35.0 30.0 - 36.0 g/dL   RDW 13.8 11.5 - 15.5 %   Platelets 134 (L) 150 - 400 K/uL   Neutrophils Relative % 89 %   Lymphocytes Relative 3 %   Monocytes Relative 1 %   Eosinophils Relative 0 %   Basophils Relative 0 %   Band Neutrophils 7 %   Neutro Abs 32.2 (H) 1.7 - 7.7 K/uL   Lymphs Abs 1.0 0.7 - 4.0 K/uL   Monocytes Absolute 0.3 0.1 - 1.0 K/uL   Eosinophils Absolute 0.0 0.0 - 0.7 K/uL   Basophils Absolute 0.0 0.0 - 0.1 K/uL   WBC Morphology TOXIC GRANULATION  Protime-INR     Status: Abnormal   Collection Time: 06/08/17  5:46 PM  Result Value Ref Range   Prothrombin Time 17.9 (H) 11.4 - 15.2 seconds   INR 1.49   T4, free     Status: None   Collection Time: 06/08/17  5:46 PM  Result Value Ref Range   Free T4 0.93 0.61 - 1.12 ng/dL    Comment: (NOTE) Biotin ingestion may interfere with free T4 tests. If the results are inconsistent with the TSH level, previous test results, or the clinical presentation, then consider biotin interference. If needed, order repeat testing after stopping biotin.   I-stat troponin, ED     Status: Abnormal   Collection Time: 06/08/17  5:55 PM   Result Value Ref Range   Troponin i, poc 0.40 (HH) 0.00 - 0.08 ng/mL   Comment NOTIFIED PHYSICIAN    Comment 3            Comment: Due to the release kinetics of cTnI, a negative result within the first hours of the onset of symptoms does not rule out myocardial infarction with certainty. If myocardial infarction is still suspected, repeat the test at appropriate intervals.   I-stat Chem 8, ED     Status: Abnormal   Collection Time: 06/08/17  5:57 PM  Result Value Ref Range   Sodium 126 (L) 135 - 145 mmol/L   Potassium 5.5 (H) 3.5 - 5.1 mmol/L   Chloride 91 (L) 101 - 111 mmol/L   BUN 77 (H) 6 - 20 mg/dL   Creatinine, Ser 3.00 (H) 0.61 - 1.24 mg/dL   Glucose, Bld 152 (H) 65 - 99 mg/dL   Calcium, Ion 0.83 (LL) 1.15 - 1.40 mmol/L   TCO2 21 (L) 22 - 32 mmol/L   Hemoglobin 17.7 (H) 13.0 - 17.0 g/dL   HCT 52.0 39.0 - 52.0 %   Comment NOTIFIED PHYSICIAN   I-Stat CG4 Lactic Acid, ED     Status: Abnormal   Collection Time: 06/08/17  5:57 PM  Result Value Ref Range   Lactic Acid, Venous 8.50 (HH) 0.5 - 1.9 mmol/L   Comment NOTIFIED PHYSICIAN   I-Stat venous blood gas, ED     Status: Abnormal   Collection Time: 06/08/17  6:01 PM  Result Value Ref Range   pH, Ven 7.437 (H) 7.250 - 7.430   pCO2, Ven 33.8 (L) 44.0 - 60.0 mmHg   pO2, Ven 20.0 (LL) 32.0 - 45.0 mmHg   Bicarbonate 22.8 20.0 - 28.0 mmol/L   TCO2 24 22 - 32 mmol/L   O2 Saturation 35.0 %   Acid-base deficit 1.0 0.0 - 2.0 mmol/L   Patient temperature HIDE    Sample type VENOUS    Comment NOTIFIED PHYSICIAN   I-Stat CG4 Lactic Acid, ED     Status: Abnormal   Collection Time: 06/08/17  7:52 PM  Result Value Ref Range   Lactic Acid, Venous 7.09 (HH) 0.5 - 1.9 mmol/L   Comment NOTIFIED PHYSICIAN   TSH     Status: None   Collection Time: 06/08/17  7:53 PM  Result Value Ref Range   TSH 1.817 0.350 - 4.500 uIU/mL    Comment: Performed by a 3rd Generation assay with a functional sensitivity of <=0.01 uIU/mL.  CSF cell count  with differential collection tube #: 1     Status: Abnormal   Collection Time: 06/08/17 10:58 PM  Result Value Ref Range   Tube # 1    Color, CSF COLORLESS  COLORLESS   Appearance, CSF HAZY (A) CLEAR   Supernatant NOT INDICATED    RBC Count, CSF 8 (H) 0 /cu mm   WBC, CSF 480 (HH) 0 - 5 /cu mm    Comment: CRITICAL RESULT CALLED TO, READ BACK BY AND VERIFIED WITH: W.MUNNETT,RN [id number] 06/09/17 M.CAMPBELL    Segmented Neutrophils-CSF 90 (H) 0 - 6 %   Lymphs, CSF 4 (L) 40 - 80 %   Monocyte-Macrophage-Spinal Fluid 6 (L) 15 - 45 %   Eosinophils, CSF 0 0 - 1 %  CSF cell count with differential collection tube #: 4     Status: Abnormal   Collection Time: 06/08/17 10:58 PM  Result Value Ref Range   Tube # 4    Color, CSF COLORLESS COLORLESS   Appearance, CSF HAZY (A) CLEAR   Supernatant NOT INDICATED    RBC Count, CSF 1 (H) 0 /cu mm   WBC, CSF 119 (HH) 0 - 5 /cu mm    Comment: CRITICAL RESULT CALLED TO, READ BACK BY AND VERIFIED WITH: W.MUNNETT,RN [id number] 06/09/17 M.CAMPBELL    Segmented Neutrophils-CSF 85 (H) 0 - 6 %   Lymphs, CSF 3 (L) 40 - 80 %   Monocyte-Macrophage-Spinal Fluid 12 (L) 15 - 45 %   Eosinophils, CSF 0 0 - 1 %  Glucose, CSF     Status: None   Collection Time: 06/08/17 10:58 PM  Result Value Ref Range   Glucose, CSF 59 40 - 70 mg/dL  Protein, CSF     Status: Abnormal   Collection Time: 06/08/17 10:58 PM  Result Value Ref Range   Total  Protein, CSF 170 (H) 15 - 45 mg/dL    Comment: RESULTS CONFIRMED BY MANUAL DILUTION  Gram stain     Status: None   Collection Time: 06/08/17 11:00 PM  Result Value Ref Range   Specimen Description CSF    Special Requests Normal    Gram Stain      CYTOSPIN SLIDE WBC PRESENT,BOTH PMN AND MONONUCLEAR NO ORGANISMS SEEN    Report Status 06/08/2017 FINAL   Comprehensive metabolic panel     Status: Abnormal   Collection Time: 06/08/17 11:37 PM  Result Value Ref Range   Sodium 129 (L) 135 - 145 mmol/L   Potassium 3.8 3.5 - 5.1 mmol/L    Chloride 96 (L) 101 - 111 mmol/L   CO2 18 (L) 22 - 32 mmol/L   Glucose, Bld 140 (H) 65 - 99 mg/dL   BUN 84 (H) 6 - 20 mg/dL   Creatinine, Ser 2.94 (H) 0.61 - 1.24 mg/dL   Calcium 6.6 (L) 8.9 - 10.3 mg/dL   Total Protein 5.1 (L) 6.5 - 8.1 g/dL   Albumin 1.6 (L) 3.5 - 5.0 g/dL   AST 205 (H) 15 - 41 U/L   ALT 98 (H) 17 - 63 U/L   Alkaline Phosphatase 105 38 - 126 U/L   Total Bilirubin 1.4 (H) 0.3 - 1.2 mg/dL   GFR calc non Af Amer 23 (L) >60 mL/min   GFR calc Af Amer 27 (L) >60 mL/min    Comment: (NOTE) The eGFR has been calculated using the CKD EPI equation. This calculation has not been validated in all clinical situations. eGFR's persistently <60 mL/min signify possible Chronic Kidney Disease.    Anion gap 15 5 - 15  Troponin I     Status: Abnormal   Collection Time: 06/08/17 11:37 PM  Result Value Ref Range   Troponin I 0.36 (HH) <  0.03 ng/mL    Comment: CRITICAL RESULT CALLED TO, READ BACK BY AND VERIFIED WITH: MUNNETT [facility] 06/09/17 0039 WAYK   Lactic acid, plasma     Status: None   Collection Time: 06/08/17 11:38 PM  Result Value Ref Range   Lactic Acid, Venous 1.8 0.5 - 1.9 mmol/L  Procalcitonin - Baseline     Status: None   Collection Time: 06/09/17 12:14 AM  Result Value Ref Range   Procalcitonin 81.75 ng/mL    Comment:        Interpretation: PCT >= 10 ng/mL: Important systemic inflammatory response, almost exclusively due to severe bacterial sepsis or septic shock. (NOTE)       Sepsis PCT Algorithm           Lower Respiratory Tract                                      Infection PCT Algorithm    ----------------------------     ----------------------------         PCT < 0.25 ng/mL                PCT < 0.10 ng/mL         Strongly encourage             Strongly discourage   discontinuation of antibiotics    initiation of antibiotics    ----------------------------     -----------------------------       PCT 0.25 - 0.50 ng/mL            PCT 0.10 - 0.25 ng/mL                OR       >80% decrease in PCT            Discourage initiation of                                            antibiotics      Encourage discontinuation           of antibiotics    ----------------------------     -----------------------------         PCT >= 0.50 ng/mL              PCT 0.26 - 0.50 ng/mL                AND       <80% decrease in PCT             Encourage initiation of                                             antibiotics       Encourage continuation           of antibiotics    ----------------------------     -----------------------------        PCT >= 0.50 ng/mL                  PCT > 0.50 ng/mL               AND         increase in PCT  Strongly encourage                                      initiation of antibiotics    Strongly encourage escalation           of antibiotics                                     -----------------------------                                           PCT <= 0.25 ng/mL                                                 OR                                        > 80% decrease in PCT                                     Discontinue / Do not initiate                                             antibiotics   I-Stat arterial blood gas, ED     Status: Abnormal   Collection Time: 06/09/17 12:46 AM  Result Value Ref Range   pH, Arterial 7.500 (H) 7.350 - 7.450   pCO2 arterial 25.5 (L) 32.0 - 48.0 mmHg   pO2, Arterial 138.0 (H) 83.0 - 108.0 mmHg   Bicarbonate 19.9 (L) 20.0 - 28.0 mmol/L   TCO2 21 (L) 22 - 32 mmol/L   O2 Saturation 99.0 %   Acid-base deficit 2.0 0.0 - 2.0 mmol/L   Patient temperature 36.9 C    Collection site RADIAL, ALLEN'S TEST ACCEPTABLE    Drawn by RT    Sample type ARTERIAL    Ct Head Wo Contrast  Result Date: 06/08/2017 CLINICAL DATA:  Found unresponsive. EXAM: CT HEAD WITHOUT CONTRAST TECHNIQUE: Contiguous axial images were obtained from the base of the skull through the vertex without  intravenous contrast. COMPARISON:  None. FINDINGS: Brain: There is a small focus of low density in the superior right cerebellar hemisphere. There are additional small areas of patchy low-density which involve both cortex and white matter in the medial right parieto-occipital region and in the posterior right frontal lobe. These are concerning for acute or early subacute infarcts, with trace petechial hemorrhage not excluded though without a discrete parenchymal hematoma. No subarachnoid hemorrhage or extra-axial fluid collection is seen, and there is no midline shift. The ventricles are normal in size. Vascular: Calcified atherosclerosis at the skullbase. No hyperdense vessel. Skull: No fracture or focal osseous lesion. Sinuses/Orbits: Visualized paranasal sinuses and mastoid air cells are clear. Orbits are unremarkable. Other: None. IMPRESSION: Patchy hypodensities in  the right cerebral hemisphere and right cerebellum concerning for acute/ early subacute infarcts. Consider MRI for further evaluation. Electronically Signed   By: Logan Bores M.D.   On: 06/08/2017 21:02   US Abdomen Complete  Result Date: 06/08/2017 CLINICAL DATA:  Sepsis with elevated liver function tests and bilirubin today. EXAM: ABDOMEN ULTRASOUND COMPLETE COMPARISON:  None. FINDINGS: Gallbladder: Intraluminal tumefactive biliary sludge is noted within the gallbladder lumen. No wall thickening is identified. No pericholecystic fluid is noted. No sonographic Murphy sign noted by sonographer. Common bile duct: Diameter: 4.9 mm and within normal limits in caliber. No choledocholithiasis. Liver: No focal lesion identified. Within normal limits in parenchymal echogenicity. Portal vein is patent on color Doppler imaging with normal direction of blood flow towards the liver. IVC: No abnormality visualized. Pancreas: Visualized portion unremarkable. Spleen: 11.1 x 13.2 x 5.8 cm (volume = 440 cm^3) without space-occupying mass. Right Kidney: Length:  11.5 cm. Echogenicity within normal limits. No mass or hydronephrosis visualized. Left Kidney: Length: 12.1 cm. Echogenicity within normal limits. No mass or hydronephrosis visualized. Abdominal aorta: No aneurysm visualized. Mild aortoiliac atherosclerosis. Other findings: None. IMPRESSION: 1. Biliary sludge noted within the gallbladder without acute findings of cholecystitis. No biliary dilatation. 2. Mild splenomegaly. Electronically Signed   By: Ashley Royalty M.D.   On: 06/08/2017 21:52   Dg Chest Port 1 View  Result Date: 06/08/2017 CLINICAL DATA:  Altered mental status EXAM: PORTABLE CHEST 1 VIEW COMPARISON:  02/15/2005 FINDINGS: The heart size and mediastinal contours are within normal limits. Both lungs are clear. The visualized skeletal structures are unremarkable. IMPRESSION: No active disease. Electronically Signed   By: Ashley Royalty M.D.   On: 06/08/2017 18:18   Dg Knee Complete 4 Views Left  Result Date: 06/09/2017 CLINICAL DATA:  52 y/o  M; knee injury with effusion. EXAM: LEFT KNEE - COMPLETE 4+ VIEW COMPARISON:  None. FINDINGS: No acute fracture or dislocation identified. Borderline patella alta. Mild medial and moderate lateral femorotibial compartment joint space narrowing and tricompartmental osteophytes. Large joint effusion. IMPRESSION: 1. Large joint effusion.  No acute fracture identified. 2. Osteoarthrosis of the knee greatest in lateral femorotibial compartment. 3. Borderline patella alta. Electronically Signed   By: Kristine Garbe M.D.   On: 06/09/2017 00:16    Assessment: 52 y.o. male presenting with constellation of findings that are most consistent with SBE. Has multiple cerebral infarcts on MRI, some of which appear hemorrhagic 1. CSF hazy with low glucose (glucose of 59 is below the normal limit of 0.6x the serum glucose), 480 WBC with neutrophilic predominance and total protein of 170. CSF is colorless with 1-8 RBC therefore HSV encephalitis is unlikely (MRI pattern  also not consistent with herpes encephalitis).  2. Markedly elevated BUN and Cr, most likely secondary to AKI given that he has no history of CKD. 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  3. Multiple metabolic derangements including hypocalcemia (6.6 total with ionized Ca of 0.83), hyponatremia, elevated LFTs, elevated CK 4. Leukocytosis of 33.5.  5. Elevated Troponin. 6. Thrombocytopenia.   7. Positive urine toxicology for benzodiazepines, opiates and cocaine. The findings are suggestive of a substance use disorder  8. Blood culture positive for staphylococcus.  Recommendations: 1. Anticoagulation contraindicated in SBE. ASA of no benefit and can actually increase the risk for major hemorrhage.  2. Telemetry monitoring 3. Frequent neuro checks 4. Renal imaging and nephrology consult 5. Correct serum calcium and continue to monitor.  6. Gradual correction of serum sodium.  7. Expedited ID consult for staphylococcal bacteremia/sepsis. 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 Per lab report, the staph is methicillin sensitive (MSSA). Preferred therapy is anti staphylococcal beta lactam antibiotic (Cefazolin or Nafcillin), unless clinically contraindicated. Continue ceftriaxone, Zosyn and vancomycin for now.  8. CSF culture is pending. Most likely organism is Staph given blood culture results.  9. TTE pending. Assess for vegetations.  10. Cardiology consult re: probable bacterial endocarditis. 11. Carotid ultrasound.  12. If the patient survives the acute presentation. He will need to participate over the short and long term in a drug cessation program or programs. He is likely to be at high risk for relapse.  13. Discussed Neurological condition and prognosis with family.   A total of 50 minutes was spent in the emergent Neurological evaluation and management of this critically ill patient  '@Electronically'$  signed: Dr. Kerney Elbe 06/09/2017, 1:49 AM

## 2017-06-09 NOTE — H&P (Signed)
PULMONARY / CRITICAL CARE MEDICINE   Name: Marcus [name] MRN: [id number] DOB: Sep 17, 1965    ADMISSION DATE:  06/08/2017 CONSULTATION DATE:  1/7  REFERRING MD:  ER  CHIEF COMPLAINT:  Altered mental status   HISTORY OF PRESENT ILLNESS:    This is a 52 yo man with no notable past medical history, here with altered mental status. Hsitory obtained from EDP. No family at bedside. Pt not following commands but winces to pain, Per chart review, pt was last seen normal by his girlfriend at 12:00, and later she found him on the ground, on his right side, as unresponsive. He was tachycardic in EM. He has history fo opiate use- UDS was positive for cocaine and opiates and benzo.  PAST MEDICAL HISTORY :  He  has no past medical history on file.  PAST SURGICAL HISTORY: He  has a past surgical history that includes Hip surgery; Ankle surgery; and Hip fracture surgery (Left, 1994).  No Known Allergies  No current facility-administered medications on file prior to encounter.    Current Outpatient Medications on File Prior to Encounter  Medication Sig  . acetaminophen (TYLENOL) 500 MG tablet Take 500-1,000 mg by mouth every 6 (six) hours as needed (for pain or headaches).  Marland Kitchen ibuprofen (ADVIL,MOTRIN) 200 MG tablet Take 200-600 mg by mouth every 6 (six) hours as needed (for pain or headaches).   Marland Kitchen amoxicillin-clavulanate (AUGMENTIN) 875-125 MG per tablet Take 1 tablet by mouth 2 (two) times daily. (Patient not taking: Reported on 06/08/2017)  . diazepam (VALIUM) 5 MG tablet Take 1 tablet (5 mg total) by mouth every 6 (six) hours as needed for muscle spasms. (Patient not taking: Reported on 06/08/2017)  . oxyCODONE-acetaminophen (PERCOCET/ROXICET) 5-325 MG tablet Take 1 tablet by mouth every 4 (four) hours as needed for severe pain (pain). (Patient not taking: Reported on 06/08/2017)    FAMILY HISTORY:  His has no family status information on file.    SOCIAL HISTORY: He  reports that he quit smoking about  19 months ago. His smoking use included cigarettes. He has a 10.00 pack-year smoking history. he has never used smokeless tobacco. He reports that he drinks alcohol. He reports that he does not use drugs.  REVIEW OF SYSTEMS:   Level 5 caveat    VITAL SIGNS: BP 108/73   Pulse 100   Temp 98.4 F (36.9 C)   Resp (!) 24   Wt 170 lb (77.1 kg)   SpO2 100%   BMI 23.71 kg/m   HEMODYNAMICS:    VENTILATOR SETTINGS:    INTAKE / OUTPUT: I/O last 3 completed shifts: In: -  Out: 900 [Urine:900]  PHYSICAL EXAMINATION: General:  Altered mental status, not following commands, but winces to pain . Chronically ill Neuro:  As above. Moving all extremities spontaneously. Normal reflexes. Pupils reactive to light.  HEENT:  NCAT, MMM, poor dentition  Cardiovascular:  Tachycardic, tachypneic  Lungs:  CTAB, no wheezing anteriorly  Abdomen:  Soft, nondistended, normal bowel sounds  Musculoskeletal: moves spontaneously  Skin:  Skin warm and dry, intact   LABS:  BMET Recent Labs  Lab 06/08/17 1746 06/08/17 1757 06/08/17 2337  NA 126* 126* 129*  K 5.1 5.5* 3.8  CL 85* 91* 96*  CO2 16*  --  18*  BUN 79* 77* 84*  CREATININE 3.00* 3.00* 2.94*  GLUCOSE 147* 152* 140*    Electrolytes Recent Labs  Lab 06/08/17 1746 06/08/17 2337  CALCIUM 7.9* 6.6*    CBC Recent Labs  Lab 06/08/17 1746 06/08/17 1757  WBC 33.5*  --   HGB 16.4 17.7*  HCT 46.9 52.0  PLT 134*  --     Coag's Recent Labs  Lab 06/08/17 1746  INR 1.49    Sepsis Markers Recent Labs  Lab 06/08/17 1757 06/08/17 1952 06/08/17 2338  LATICACIDVEN 8.50* 7.09* 1.8    ABG Recent Labs  Lab 06/09/17 0046  PHART 7.500*  PCO2ART 25.5*  PO2ART 138.0*    Liver Enzymes Recent Labs  Lab 06/08/17 1746 06/08/17 2337  AST 187* 205*  ALT 107* 98*  ALKPHOS 190* 105  BILITOT 2.2* 1.4*  ALBUMIN 2.3* 1.6*    Cardiac Enzymes Recent Labs  Lab 06/08/17 2337  TROPONINI 0.36*    Glucose No results for  input(s): GLUCAP in the last 168 hours.  Imaging Ct Head Wo Contrast  Result Date: 06/08/2017 CLINICAL DATA:  Found unresponsive. EXAM: CT HEAD WITHOUT CONTRAST TECHNIQUE: Contiguous axial images were obtained from the base of the skull through the vertex without intravenous contrast. COMPARISON:  None. FINDINGS: Brain: There is a small focus of low density in the superior right cerebellar hemisphere. There are additional small areas of patchy low-density which involve both cortex and white matter in the medial right parieto-occipital region and in the posterior right frontal lobe. These are concerning for acute or early subacute infarcts, with trace petechial hemorrhage not excluded though without a discrete parenchymal hematoma. No subarachnoid hemorrhage or extra-axial fluid collection is seen, and there is no midline shift. The ventricles are normal in size. Vascular: Calcified atherosclerosis at the skullbase. No hyperdense vessel. Skull: No fracture or focal osseous lesion. Sinuses/Orbits: Visualized paranasal sinuses and mastoid air cells are clear. Orbits are unremarkable. Other: None. IMPRESSION: Patchy hypodensities in the right cerebral hemisphere and right cerebellum concerning for acute/ early subacute infarcts. Consider MRI for further evaluation. Electronically Signed   By: Sebastian Ache M.D.   On: 06/08/2017 21:02   US Abdomen Complete  Result Date: 06/08/2017 CLINICAL DATA:  Sepsis with elevated liver function tests and bilirubin today. EXAM: ABDOMEN ULTRASOUND COMPLETE COMPARISON:  None. FINDINGS: Gallbladder: Intraluminal tumefactive biliary sludge is noted within the gallbladder lumen. No wall thickening is identified. No pericholecystic fluid is noted. No sonographic Murphy sign noted by sonographer. Common bile duct: Diameter: 4.9 mm and within normal limits in caliber. No choledocholithiasis. Liver: No focal lesion identified. Within normal limits in parenchymal echogenicity. Portal vein  is patent on color Doppler imaging with normal direction of blood flow towards the liver. IVC: No abnormality visualized. Pancreas: Visualized portion unremarkable. Spleen: 11.1 x 13.2 x 5.8 cm (volume = 440 cm^3) without space-occupying mass. Right Kidney: Length: 11.5 cm. Echogenicity within normal limits. No mass or hydronephrosis visualized. Left Kidney: Length: 12.1 cm. Echogenicity within normal limits. No mass or hydronephrosis visualized. Abdominal aorta: No aneurysm visualized. Mild aortoiliac atherosclerosis. Other findings: None. IMPRESSION: 1. Biliary sludge noted within the gallbladder without acute findings of cholecystitis. No biliary dilatation. 2. Mild splenomegaly. Electronically Signed   By: Tollie Eth M.D.   On: 06/08/2017 21:52   Dg Chest Port 1 View  Result Date: 06/08/2017 CLINICAL DATA:  Altered mental status EXAM: PORTABLE CHEST 1 VIEW COMPARISON:  02/15/2005 FINDINGS: The heart size and mediastinal contours are within normal limits. Both lungs are clear. The visualized skeletal structures are unremarkable. IMPRESSION: No active disease. Electronically Signed   By: Tollie Eth M.D.   On: 06/08/2017 18:18   Dg Knee Complete 4 Views Left  Result Date: 06/09/2017 CLINICAL DATA:  52 y/o  M; knee injury with effusion. EXAM: LEFT KNEE - COMPLETE 4+ VIEW COMPARISON:  None. FINDINGS: No acute fracture or dislocation identified. Borderline patella alta. Mild medial and moderate lateral femorotibial compartment joint space narrowing and tricompartmental osteophytes. Large joint effusion. IMPRESSION: 1. Large joint effusion.  No acute fracture identified. 2. Osteoarthrosis of the knee greatest in lateral femorotibial compartment. 3. Borderline patella alta. Electronically Signed   By: Mitzi HansenLance  Furusawa-Stratton M.D.   On: 06/09/2017 00:16     STUDIES:  CT head 1/6 >> ; Patchy hypodensities in the right cerebral hemisphere and right cerebellum concerning for acute/ early subacute infarcts.  Consider MRI for further evaluation.  MRI brain 1/7 >>   CULTURES: Blood 1.6 >> CSF 1/6 >>   ANTIBIOTICS: Rocephin 1/7 >> Zosyn 1/6 >> Vanc 1/7 >>  SIGNIFICANT EVENTS: LP performed 1/6 >>  LINES/TUBES: Foley 1/6 >>  DISCUSSION: 52 yo man with no significant medical history comes with altered mental status.   ASSESSMENT / PLAN:  PULMONARY A: Acute respiratory alkalosis with metabolic compensation: ABG obtained at 12:40 AM.  respiratory alkalosis is commonly encountered in anxiety, panic, pain, fever, psychosis, and hyperventilation syndrome. Respiratory alkalosis can also be found in any medical condition that increases alveolar ventilation including pulmonary embolism, heart failure, or mechanical ventilation, as well as in stroke, meningitis, high altitude, right-to-left shunts, pregnancy, hyperthyroidism,   P:   -currently on room air- will continue to monitor -acid base disturbance- acute resp alkalosis -obtain echo   CARDIOVASCULAR A:  Tachycardia due to sepsis  Possible endocarditis given his history of substance abuse \ Elevated troponin 0.24- likely due to sepsis   P:  -tele monitor -goal MAP > 65 mm Hg -no prior echo on file- will obtain echo  -IV fluids per sepsis protocol - already received 3.5  L of NS  RENAL A:   Acute renal failure with Cr of 3, baseline Cr unknown Presented with High anion gap metabolic acidosis due to lactic acidosis , now with acute hypercapnic resp alkalosis  Hyponatremia with Na of 126 Elevated CK of 791  P:   Trend lactic acidosis  Trend BMP  -monitory urine output, and pt has foley catheter  -check mag and phos  -Avoid nephrotoxic agents, ensure adequate renal perfusion -getting IV fluids per sepsis protocol  -obtain renal ultrasound   GASTROINTESTINAL A:   Transaminitis due to sepsis  Stress ulcer prophylaxis   P:   IV PPI NPO   HEMATOLOGIC A:   No acute issues- normal hemoglobin  DVT PPx  P:  DVT  ppx  -trend CBC  INFECTIOUS A:   Sepsis due to Bacterial meningitis, vs septic embolic to brain vs encephalitis vs endocarditis vs overdose   LP was done.  Tube 4 has WBC count of 119  With neutrophilic predominance and tube 1 has WBC count of 480  P:   -vanc and zosyn per pharmacy  -await LP cultures and blood cultures  -check HIV  -consult to neurology  -trend lactic acid -trend white count -monitor fever curve Fluids per sepsis protocol   ENDOCRINE A:   Glucose in normal range, no known history of diabetes  P:   Continue to monitor blood glucose   NEUROLOGIC A:   Altered mental status , Likely due to septic emboli vs meningitis vs stroke  Vs overdose  CT head shows Patchy hypodensities in the right cerebral hemisphere and right cerebellum concerning for acute/ early  subacute infarcts. Obtaining MRI UDS positive for cocaine, benzo, and opiates   P:   -obtain MRI -consulted neurology-  Stroke workup  -1. HgbA1c, fasting lipid panel 2. MRI, MRA  of the brain without contrast 3. PT consult, OT consult, Speech consult 4. Echocardiogram 5. Carotid dopplers     FAMILY  - Updates: no family at bedside  - Inter-disciplinary family meet or Palliative Care meeting due by:  1/14    Pulmonary and Critical Care Medicine [facility] Pager: [phone]  06/09/2017, 12:51 AM

## 2017-06-09 NOTE — Progress Notes (Signed)
PHARMACY - PHYSICIAN COMMUNICATION CRITICAL VALUE ALERT - BLOOD CULTURE IDENTIFICATION (BCID)  Marcus [name] is an 52 y.o. male who [facility]  Assessment: source unknown, still undergoing comprehensive work-up  Name of physician (or Provider) Contacted: Dr. Arsenio LoaderSommer Pola Corn([facility])  Current antibiotics: Vancomycin/Zosyn  Changes to prescribed antibiotics recommended:  Continue current anti-biotics   Results for orders placed or performed during the hospital encounter of 06/08/17  Blood Culture ID Panel (Reflexed) (Collected: 06/08/2017  5:46 PM)  Result Value Ref Range   Enterococcus species NOT DETECTED NOT DETECTED   Listeria monocytogenes NOT DETECTED NOT DETECTED   Staphylococcus species DETECTED (A) NOT DETECTED   Staphylococcus aureus DETECTED (A) NOT DETECTED   Methicillin resistance NOT DETECTED NOT DETECTED   Streptococcus species NOT DETECTED NOT DETECTED   Streptococcus agalactiae NOT DETECTED NOT DETECTED   Streptococcus pneumoniae NOT DETECTED NOT DETECTED   Streptococcus pyogenes NOT DETECTED NOT DETECTED   Acinetobacter baumannii NOT DETECTED NOT DETECTED   Enterobacteriaceae species NOT DETECTED NOT DETECTED   Enterobacter cloacae complex NOT DETECTED NOT DETECTED   Escherichia coli NOT DETECTED NOT DETECTED   Klebsiella oxytoca NOT DETECTED NOT DETECTED   Klebsiella pneumoniae NOT DETECTED NOT DETECTED   Proteus species NOT DETECTED NOT DETECTED   Serratia marcescens NOT DETECTED NOT DETECTED   Haemophilus influenzae NOT DETECTED NOT DETECTED   Neisseria meningitidis NOT DETECTED NOT DETECTED   Pseudomonas aeruginosa NOT DETECTED NOT DETECTED   Candida albicans NOT DETECTED NOT DETECTED   Candida glabrata NOT DETECTED NOT DETECTED   Candida krusei NOT DETECTED NOT DETECTED   Candida parapsilosis NOT DETECTED NOT DETECTED   Candida tropicalis NOT DETECTED NOT DETECTED    Abran DukeLedford, Blanchard [place]ite 06/09/2017  6:04 AM

## 2017-06-10 DIAGNOSIS — M009 Pyogenic arthritis, unspecified: Secondary | ICD-10-CM

## 2017-06-10 DIAGNOSIS — R768 Other specified abnormal immunological findings in serum: Secondary | ICD-10-CM | POA: Diagnosis present

## 2017-06-10 DIAGNOSIS — R7689 Other specified abnormal immunological findings in serum: Secondary | ICD-10-CM

## 2017-06-10 DIAGNOSIS — E875 Hyperkalemia: Secondary | ICD-10-CM

## 2017-06-10 HISTORY — DX: Other specified abnormal immunological findings in serum: R76.8

## 2017-06-10 HISTORY — DX: Other specified abnormal immunological findings in serum: R76.89

## 2017-06-10 LAB — BASIC METABOLIC PANEL
Anion gap: 12 (ref 5–15)
Anion gap: 9 (ref 5–15)
BUN: 68 mg/dL — AB (ref 6–20)
BUN: 58 mg/dL — AB (ref 6–20)
CALCIUM: 6.3 mg/dL — AB (ref 8.9–10.3)
CHLORIDE: 101 mmol/L (ref 101–111)
CO2: 17 mmol/L — AB (ref 22–32)
CO2: 20 mmol/L — AB (ref 22–32)
CREATININE: 2.01 mg/dL — AB (ref 0.61–1.24)
CREATININE: 1.89 mg/dL — AB (ref 0.61–1.24)
Calcium: 6.8 mg/dL — ABNORMAL LOW (ref 8.9–10.3)
Chloride: 100 mmol/L — ABNORMAL LOW (ref 101–111)
GFR calc Af Amer: 46 mL/min — ABNORMAL LOW (ref >60)
GFR calc non Af Amer: 37 mL/min — ABNORMAL LOW (ref >60)
GFR calc non Af Amer: 40 mL/min — ABNORMAL LOW (ref >60)
GFR, EST AFRICAN AMERICAN: 43 mL/min — AB (ref >60)
Glucose, Bld: 117 mg/dL — ABNORMAL HIGH (ref 65–99)
Glucose, Bld: 108 mg/dL — ABNORMAL HIGH (ref 65–99)
Potassium: 2.7 mmol/L — CL (ref 3.5–5.1)
Potassium: 3.1 mmol/L — ABNORMAL LOW (ref 3.5–5.1)
SODIUM: 129 mmol/L — AB (ref 135–145)
Sodium: 130 mmol/L — ABNORMAL LOW (ref 135–145)

## 2017-06-10 LAB — GLUCOSE, CAPILLARY
Glucose-Capillary: 113 mg/dL — ABNORMAL HIGH (ref 65–99)
Glucose-Capillary: 124 mg/dL — ABNORMAL HIGH (ref 65–99)
Glucose-Capillary: 104 mg/dL — ABNORMAL HIGH (ref 65–99)
Glucose-Capillary: 113 mg/dL — ABNORMAL HIGH (ref 65–99)
Glucose-Capillary: 126 mg/dL — ABNORMAL HIGH (ref 65–99)
Glucose-Capillary: 95 mg/dL (ref 65–99)

## 2017-06-10 LAB — SYNOVIAL CELL COUNT + DIFF, W/ CRYSTALS
Crystals, Fluid: NONE SEEN
Eosinophils-Synovial: 0 % (ref 0–1)
Lymphocytes-Synovial Fld: 2 % (ref 0–20)
Monocyte-Macrophage-Synovial Fluid: 6 % — ABNORMAL LOW (ref 50–90)
Neutrophil, Synovial: 92 % — ABNORMAL HIGH (ref 0–25)
WBC, SYNOVIAL: 14600 /mm3 — AB (ref 0–200)

## 2017-06-10 LAB — CBC
HCT: 29.6 % — ABNORMAL LOW (ref 39.0–52.0)
Hemoglobin: 10 g/dL — ABNORMAL LOW (ref 13.0–17.0)
MCH: 27.5 pg (ref 26.0–34.0)
MCHC: 33.8 g/dL (ref 30.0–36.0)
MCV: 81.3 fL (ref 78.0–100.0)
PLATELETS: 55 10*3/uL — AB (ref 150–400)
RBC: 3.64 MIL/uL — AB (ref 4.22–5.81)
RDW: 14 % (ref 11.5–15.5)
WBC: 11.8 10*3/uL — ABNORMAL HIGH (ref 4.0–10.5)

## 2017-06-10 LAB — LIPID PANEL
Cholesterol: 61 mg/dL (ref 0–200)
HDL: <10 mg/dL — ABNORMAL LOW (ref >40)
TRIGLYCERIDES: 82 mg/dL (ref <150)
VLDL: 16 mg/dL (ref 0–40)

## 2017-06-10 LAB — HEPATITIS PANEL, ACUTE
HEP A IGM: NEGATIVE
HEP B C IGM: NEGATIVE
Hepatitis B Surface Ag: NEGATIVE

## 2017-06-10 LAB — MAGNESIUM: MAGNESIUM: 2.2 mg/dL (ref 1.7–2.4)

## 2017-06-10 LAB — HEMOGLOBIN A1C
Hgb A1c MFr Bld: 5.9 % — ABNORMAL HIGH (ref 4.8–5.6)
Mean Plasma Glucose: 122.63 mg/dL

## 2017-06-10 LAB — PROCALCITONIN: Procalcitonin: 43.77 ng/mL

## 2017-06-10 LAB — PHOSPHORUS: Phosphorus: 4.1 mg/dL (ref 2.5–4.6)

## 2017-06-10 LAB — VDRL, CSF: SYPHILIS VDRL QUANT CSF: NONREACTIVE

## 2017-06-10 MED ORDER — SODIUM CHLORIDE 0.9 % IV SOLN
1.0000 g | Freq: Once | INTRAVENOUS | Status: AC
  Administered 2017-06-10: 1 g via INTRAVENOUS
  Filled 2017-06-10: qty 10

## 2017-06-10 MED ORDER — ACETAMINOPHEN 325 MG PO TABS
650.0000 mg | ORAL_TABLET | Freq: Four times a day (QID) | ORAL | Status: DC | PRN
  Administered 2017-06-10 – 2017-06-22 (×9): 650 mg via ORAL
  Filled 2017-06-10 (×10): qty 2

## 2017-06-10 MED ORDER — POTASSIUM CHLORIDE 10 MEQ/100ML IV SOLN
10.0000 meq | INTRAVENOUS | Status: AC
  Administered 2017-06-10 (×4): 10 meq via INTRAVENOUS
  Filled 2017-06-10 (×4): qty 100

## 2017-06-10 MED ORDER — LIDOCAINE HCL 1 % IJ SOLN
5.0000 mL | Freq: Once | INTRAMUSCULAR | Status: DC
  Filled 2017-06-10: qty 5

## 2017-06-10 MED ORDER — THIAMINE HCL 100 MG/ML IJ SOLN
100.0000 mg | Freq: Every day | INTRAMUSCULAR | Status: DC
  Administered 2017-06-10 – 2017-06-11 (×2): 100 mg via INTRAVENOUS
  Filled 2017-06-10 (×2): qty 2

## 2017-06-10 MED ORDER — MORPHINE SULFATE (PF) 4 MG/ML IV SOLN
2.0000 mg | INTRAVENOUS | Status: DC | PRN
  Administered 2017-06-10 – 2017-06-14 (×19): 2 mg via INTRAVENOUS
  Filled 2017-06-10 (×20): qty 1

## 2017-06-10 MED ORDER — BUPIVACAINE HCL (PF) 0.5 % IJ SOLN
10.0000 mL | Freq: Once | INTRAMUSCULAR | Status: DC
  Filled 2017-06-10: qty 10

## 2017-06-10 NOTE — Progress Notes (Signed)
eLink Physician-Brief Progress Note Patient Name: Marcus [name] DOB: 01/26/1966 MRN: [id number]   Date of Service  06/10/2017  HPI/Events of Note  K+ = 2.7, Ca++ = 6.3 and Creatinine = 2.1.  eICU Interventions  Will order: 1. Rreplace Ca++ and K+. 2. Repeat BMP aat 12 noon.      Intervention Category Major Interventions: Electrolyte abnormality - evaluation and management  Sommer,Steven Eugene 06/10/2017, 7:04 AM

## 2017-06-10 NOTE — Progress Notes (Signed)
CRITICAL VALUE ALERT  Critical Value:  Synovial fluid positive for bacteria  Date & Time Notied:  06/10/2017 1415  Provider Notified: Dr. Ardeth PerfectJeong  Orders Received/Actions taken: Antibiotics reviewed.

## 2017-06-10 NOTE — Progress Notes (Signed)
Took pt's temp axillary which was 101.2.  RN proceeded to take a rectal temp which was 101.4.  Notified Dr. Ardeth PerfectJeong of this.  Orders for tylenol placed.  Also ordering pt a fan. Pt refuses ice packs as he was wanting approximately 5 blankets on top of him.  Patient was educated by Charity fundraiserN.  Blankets were removed and we compromised with a single sheet.  Will continue to monitor pt.

## 2017-06-10 NOTE — Care Management Note (Addendum)
Case Management Note  Patient Details  Name: Marcus [name] MRN: [id number] Date of Birth: 04/29/1966  Subjective/Objective:   Pt admitted on 06/08/17 after being found unresponsive; he has multiple defects in the RT hemisphere and RT cerebellum, warm ballotable effusion of the LT knee, and is growing staph aureus from the blood, likely endocarditis.  PTA, pt independent and from home.                   Action/Plan: Pt will need long-term IV antibiotics, per ID. Not a good candidate for home PICC therapy due to drug hx.  Will likely need SNF for IV antibiotics.  Will consult CSW as placement will likely be difficult due to drug hx and no payor source.  Needs PT/OT consults when able to tolerate therapy.    Expected Discharge Date:                  Expected Discharge Plan:  Skilled Nursing Facility  In-House Referral:  Clinical Social Work  Discharge planning Services  CM Consult  Post Acute Care Choice:    Choice offered to:     DME Arranged:    DME Agency:     HH Arranged:    HH Agency:     Status of Service:  In process, will continue to follow  If discussed at Long Length of Stay Meetings, dates discussed:    Additional Comments:  Quintella BatonJulie W. Kue Fox, RN, BSN  Trauma/Neuro ICU Case Manager [phone]

## 2017-06-10 NOTE — Procedures (Signed)
Procedure: Left knee aspiration and injection  Indication: Left knee effusion(s)  Surgeon: Charma IgoMichael Deangelo Berns, PA-C  Assist: None  Anesthesia: None  EBL: None  Complications: None  Findings: After risks/benefits explained patient desires to undergo procedure. Consent obtained and time out performed. The left knee was sterilely prepped and aspirated. >6770ml clear yellow fluid obtained. No sign of purulence. 3ml 1% plain lidocaine and 3ml 0.5% marcaine instilled into joint. Pt tolerated the procedure well.    Freeman CaldronMichael J. Brogan Martis, PA-C Orthopedic Surgery [phone]

## 2017-06-10 NOTE — Progress Notes (Signed)
Patient ID: Marcus [name], male   DOB: 1965/07/31, 52 y.o.   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  Repeat blood cultures are negative at 24 hours.  Transthoracic echocardiogram shows a 1.3 x 1.6 cm mitral valve vegetation.  There is no significant mitral regurgitation.  I will continue nafcillin.  Ideally, he will need to receive at least 6 weeks of IV nafcillin therapy.  He has hepatitis C antibody positive.  I will check his hepatitis C genotype and quantitative hepatitis C viral load.  His HIV antibody was negative.  PLAN: 1. Continue nafcillin 2. Await results of repeat blood cultures 3. Hepatitis C genotype and viral load  Principal Problem:   Bacteremia due to methicillin susceptible Staphylococcus aureus (MSSA) Active Problems:   Meningitis due to bacteria   Septic embolism (HCC)   Endocarditis of mitral valve   Swelling of left knee joint   Hepatitis C antibody test positive   Encephalopathy   Sepsis (HCC)   AKI (acute kidney injury) (HCC)   Elevated liver enzymes   Normocytic anemia   Thrombocytopenia (HCC)   Polysubstance abuse (HCC)   Poor dentition   Scheduled Meds: . pantoprazole (PROTONIX) IV  40 mg Intravenous Q24H  . thiamine injection  100 mg Intravenous Daily   Continuous Infusions: . sodium chloride    . sodium chloride 125 mL/hr at 06/10/17 0700  . nafcillin IV Stopped (06/10/17 1017)   PRN Meds:.sodium chloride, morphine injection   Review of Systems: Review of Systems  Unable to perform ROS: Mental acuity    No Known Allergies  OBJECTIVE: Vitals:   06/10/17 0800 06/10/17 0830 06/10/17 0900 06/10/17 1000  BP: (!) 101/58  103/63 108/63  Pulse: 80 89 87 87  Resp: (!) 23 19 (!) 21 (!) 23  Temp:  98.6 F (37 C) 98.6 F (37 C) 98.6 F (37 C) 98.8 F (37.1 C)  TempSrc:      SpO2: 97% 98% 99% 99%  Weight:      Height:       Body mass index is 22.9 kg/m.  Physical Exam  Constitutional: He is oriented to person, place, and time.  Much more alert and interactive today.  HENT:  Mouth/Throat: No oropharyngeal exudate.  Poor dentition.  Eyes: Conjunctivae are normal.  Neck: Neck supple.  Cardiovascular: Normal rate and regular rhythm.  No murmur heard. Pulmonary/Chest: Effort normal and breath sounds normal. He has no wheezes. He has no rales.  Abdominal: Soft. He exhibits no distension. There is no tenderness.  Musculoskeletal: He exhibits edema and tenderness.  No change in large left knee effusion.  Neurological: He is alert and oriented to person, place, and time.  Skin: No rash noted.  Psychiatric: Mood and affect normal.    Lab Results Lab Results  Component Value Date   WBC 11.8 (H) 06/10/2017   HGB 10.0 (L) 06/10/2017   HCT 29.6 (L) 06/10/2017   MCV 81.3 06/10/2017   PLT 55 (L) 06/10/2017    Lab Results  Component Value Date   CREATININE 2.01 (H) 06/10/2017   BUN 68 (H) 06/10/2017   NA 129 (L) 06/10/2017   K 2.7 (LL) 06/10/2017   CL  100 (L) 06/10/2017   CO2 17 (L) 06/10/2017    Lab Results  Component Value Date   ALT 101 (H) 06/09/2017   AST 204 (H) 06/09/2017   ALKPHOS 92 06/09/2017   BILITOT 1.1 06/09/2017     Microbiology: Recent Results (from the past 240 hour(s))  Culture, blood (routine x 2)     Status: Abnormal (Preliminary result)   Collection Time: 06/08/17  5:30 PM  Result Value Ref Range Status   Specimen Description BLOOD LEFT ARM  Final   Special Requests IN PEDIATRIC BOTTLE Blood Culture adequate volume  Final   Culture  Setup Time   Final    GRAM POSITIVE COCCI IN CLUSTERS IN PEDIATRIC BOTTLE CRITICAL VALUE NOTED.  VALUE IS CONSISTENT WITH PREVIOUSLY REPORTED AND CALLED VALUE.    Culture STAPHYLOCOCCUS AUREUS (A)  Final    Report Status PENDING  Incomplete  Culture, blood (routine x 2)     Status: Abnormal (Preliminary result)   Collection Time: 06/08/17  5:46 PM  Result Value Ref Range Status   Specimen Description BLOOD RIGHT ANTECUBITAL  Final   Special Requests   Final    BOTTLES DRAWN AEROBIC AND ANAEROBIC Blood Culture adequate volume   Culture  Setup Time   Final    IN BOTH AEROBIC AND ANAEROBIC BOTTLES GRAM POSITIVE COCCI IN CLUSTERS CRITICAL RESULT CALLED TO, READ BACK BY AND VERIFIED WITH: J.LEDFORD,PHARMD [phone] 06/09/17 M.Akin Yi    Culture (A)  Final    STAPHYLOCOCCUS AUREUS SUSCEPTIBILITIES TO FOLLOW    Report Status PENDING  Incomplete  Blood Culture ID Panel (Reflexed)     Status: Abnormal   Collection Time: 06/08/17  5:46 PM  Result Value Ref Range Status   Enterococcus species NOT DETECTED NOT DETECTED Final   Listeria monocytogenes NOT DETECTED NOT DETECTED Final   Staphylococcus species DETECTED (A) NOT DETECTED Final    Comment: CRITICAL RESULT CALLED TO, READ BACK BY AND VERIFIED WITH: J.LEDFORD,PHARMD [id number] 06/09/17 M.Racine Erby    Staphylococcus aureus DETECTED (A) NOT DETECTED Final    Comment: Methicillin (oxacillin) susceptible Staphylococcus aureus (MSSA). Preferred therapy is anti staphylococcal beta lactam antibiotic (Cefazolin or Nafcillin), unless clinically contraindicated. CRITICAL RESULT CALLED TO, READ BACK BY AND VERIFIED WITH: J.LEDFORD,PHARMD [phone] 06/09/17 M.Glee Lashomb    Methicillin resistance NOT DETECTED NOT DETECTED Final   Streptococcus species NOT DETECTED NOT DETECTED Final   Streptococcus agalactiae NOT DETECTED NOT DETECTED Final   Streptococcus pneumoniae NOT DETECTED NOT DETECTED Final   Streptococcus pyogenes NOT DETECTED NOT DETECTED Final   Acinetobacter baumannii NOT DETECTED NOT DETECTED Final   Enterobacteriaceae species NOT DETECTED NOT DETECTED Final   Enterobacter cloacae complex NOT DETECTED NOT DETECTED Final   Escherichia coli NOT DETECTED  NOT DETECTED Final   Klebsiella oxytoca NOT DETECTED NOT DETECTED Final   Klebsiella pneumoniae NOT DETECTED NOT DETECTED Final   Proteus species NOT DETECTED NOT DETECTED Final   Serratia marcescens NOT DETECTED NOT DETECTED Final   Haemophilus influenzae NOT DETECTED NOT DETECTED Final   Neisseria meningitidis NOT DETECTED NOT DETECTED Final   Pseudomonas aeruginosa NOT DETECTED NOT DETECTED Final   Candida albicans NOT DETECTED NOT DETECTED Final   Candida glabrata NOT DETECTED NOT DETECTED Final   Candida krusei NOT DETECTED NOT DETECTED Final   Candida parapsilosis NOT DETECTED NOT DETECTED Final   Candida tropicalis NOT DETECTED NOT DETECTED Final  Urine culture     Status: Abnormal (Preliminary result)   Collection Time: 06/08/17  7:46 PM  Result  Value Ref Range Status   Specimen Description URINE, CLEAN CATCH  Final   Special Requests Normal  Final   Culture >=100,000 COLONIES/mL UNIDENTIFIED ORGANISM (A)  Final   Report Status PENDING  Incomplete  Respiratory Panel by PCR     Status: None   Collection Time: 06/08/17  7:46 PM  Result Value Ref Range Status   Adenovirus NOT DETECTED NOT DETECTED Final   Coronavirus 229E NOT DETECTED NOT DETECTED Final   Coronavirus HKU1 NOT DETECTED NOT DETECTED Final   Coronavirus NL63 NOT DETECTED NOT DETECTED Final   Coronavirus OC43 NOT DETECTED NOT DETECTED Final   Metapneumovirus NOT DETECTED NOT DETECTED Final   Rhinovirus / Enterovirus NOT DETECTED NOT DETECTED Final   Influenza A NOT DETECTED NOT DETECTED Final   Influenza B NOT DETECTED NOT DETECTED Final   Parainfluenza Virus 1 NOT DETECTED NOT DETECTED Final   Parainfluenza Virus 2 NOT DETECTED NOT DETECTED Final   Parainfluenza Virus 3 NOT DETECTED NOT DETECTED Final   Parainfluenza Virus 4 NOT DETECTED NOT DETECTED Final   Respiratory Syncytial Virus NOT DETECTED NOT DETECTED Final   Bordetella pertussis NOT DETECTED NOT DETECTED Final   Chlamydophila pneumoniae NOT DETECTED  NOT DETECTED Final   Mycoplasma pneumoniae NOT DETECTED NOT DETECTED Final  CSF culture     Status: None (Preliminary result)   Collection Time: 06/08/17 11:00 PM  Result Value Ref Range Status   Specimen Description CSF  Final   Special Requests Normal  Final   Culture NO GROWTH 2 DAYS  Final   Report Status PENDING  Incomplete  Gram stain     Status: None   Collection Time: 06/08/17 11:00 PM  Result Value Ref Range Status   Specimen Description CSF  Final   Special Requests Normal  Final   Gram Stain   Final    CYTOSPIN SLIDE WBC PRESENT,BOTH PMN AND MONONUCLEAR NO ORGANISMS SEEN    Report Status 06/08/2017 FINAL  Final  MRSA PCR Screening     Status: None   Collection Time: 06/09/17  3:15 AM  Result Value Ref Range Status   MRSA by PCR NEGATIVE NEGATIVE Final    Comment:        The GeneXpert MRSA Assay (FDA approved for NASAL specimens only), is one component of a comprehensive MRSA colonization surveillance program. It is not intended to diagnose MRSA infection nor to guide or monitor treatment for MRSA infections.     Cliffton Asters, MD [facility] [phone] pager   [phone] cell 06/10/2017, 11:42 AM

## 2017-06-10 NOTE — Consult Note (Signed)
Reason for Consult:Left knee effusion Referring Physician: R Gorden Marcus [name] is an 52 y.o. male.  HPI: Marcus [name] was admitted yesterday with sepsis. 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 He was obtunded at the time of admission, likely 2/2 drug abuse, but is better now. He notes a long-standing hx/o left knee pain, especially when squatting, along with swelling. This has not prevented him from working. He notes it's worse since entering the hospital. Denies hx/o gout or similar or other similar episodes.  No past medical history on file.  Past Surgical History:  Procedure Laterality Date  . ANKLE SURGERY    . HIP FRACTURE SURGERY Left 1994  . HIP SURGERY      No family history on file.  Social History:  reports that he quit smoking about 19 months ago. His smoking use included cigarettes. He has a 10.00 pack-year smoking history. he has never used smokeless tobacco. He reports that he drinks alcohol. He reports that he does not use drugs.  Allergies: No Known Allergies  Medications: I have reviewed the patient's current medications.  Results for orders placed or performed during the hospital encounter of 06/08/17 (from the past 48 hour(s))  Acetaminophen level     Status: Abnormal   Collection Time: 06/08/17  5:24 PM  Result Value Ref Range   Acetaminophen (Tylenol), Serum <10 (L) 10 - 30 ug/mL    Comment:        THERAPEUTIC CONCENTRATIONS VARY SIGNIFICANTLY. A RANGE OF 10-30 ug/mL MAY BE AN EFFECTIVE CONCENTRATION FOR MANY PATIENTS. HOWEVER, SOME ARE BEST TREATED AT CONCENTRATIONS OUTSIDE THIS RANGE. ACETAMINOPHEN CONCENTRATIONS >150 ug/mL AT 4 HOURS AFTER INGESTION AND >50 ug/mL AT 12 HOURS AFTER INGESTION ARE OFTEN ASSOCIATED WITH TOXIC REACTIONS.   Ethanol     Status: None   Collection Time: 06/08/17  5:24 PM  Result Value Ref Range   Alcohol, Ethyl (B) <10 <10 mg/dL    Comment:        LOWEST DETECTABLE LIMIT FOR SERUM  ALCOHOL IS 10 mg/dL FOR MEDICAL PURPOSES ONLY   Salicylate level     Status: None   Collection Time: 06/08/17  5:24 PM  Result Value Ref Range   Salicylate Lvl <4.2 2.8 - 30.0 mg/dL  Urinalysis, Routine w reflex microscopic     Status: Abnormal   Collection Time: 06/08/17  5:24 PM  Result Value Ref Range   Color, Urine AMBER (A) YELLOW    Comment: BIOCHEMICALS MAY BE AFFECTED BY COLOR   APPearance CLOUDY (A) CLEAR   Specific Gravity, Urine 1.016 1.005 - 1.030   pH 5.0 5.0 - 8.0   Glucose, UA NEGATIVE NEGATIVE mg/dL   Hgb urine dipstick LARGE (A) NEGATIVE   Bilirubin Urine NEGATIVE NEGATIVE   Ketones, ur NEGATIVE NEGATIVE mg/dL   Protein, ur NEGATIVE NEGATIVE mg/dL   Nitrite NEGATIVE NEGATIVE   Leukocytes, UA NEGATIVE NEGATIVE   RBC / HPF 6-30 0 - 5 RBC/hpf   WBC, UA 0-5 0 - 5 WBC/hpf   Bacteria, UA RARE (A) NONE SEEN   Squamous Epithelial / LPF 0-5 (A) NONE SEEN   Mucus PRESENT    Sperm, UA PRESENT   Urine rapid drug screen (hosp performed)     Status: Abnormal   Collection Time: 06/08/17  5:24 PM  Result Value Ref Range   Opiates POSITIVE (A) NONE DETECTED   Cocaine POSITIVE (A) NONE DETECTED   Benzodiazepines POSITIVE (A) NONE DETECTED  Amphetamines NONE DETECTED NONE DETECTED   Tetrahydrocannabinol NONE DETECTED NONE DETECTED   Barbiturates NONE DETECTED NONE DETECTED    Comment: (NOTE) DRUG SCREEN FOR MEDICAL PURPOSES ONLY.  IF CONFIRMATION IS NEEDED FOR ANY PURPOSE, NOTIFY LAB WITHIN 5 DAYS. LOWEST DETECTABLE LIMITS FOR URINE DRUG SCREEN Drug Class                     Cutoff (ng/mL) Amphetamine and metabolites    1000 Barbiturate and metabolites    200 Benzodiazepine                 694 Tricyclics and metabolites     300 Opiates and metabolites        300 Cocaine and metabolites        300 THC                            50   Culture, blood (routine x 2)     Status: Abnormal (Preliminary result)   Collection Time: 06/08/17  5:30 PM  Result Value Ref Range    Specimen Description BLOOD LEFT ARM    Special Requests IN PEDIATRIC BOTTLE Blood Culture adequate volume    Culture  Setup Time      GRAM POSITIVE COCCI IN CLUSTERS IN PEDIATRIC BOTTLE CRITICAL VALUE NOTED.  VALUE IS CONSISTENT WITH PREVIOUSLY REPORTED AND CALLED VALUE.    Culture STAPHYLOCOCCUS AUREUS (A)    Report Status PENDING   CK     Status: Abnormal   Collection Time: 06/08/17  5:46 PM  Result Value Ref Range   Total CK 791 (H) 49 - 397 U/L    Comment: RESULTS CONFIRMED BY MANUAL DILUTION  Comprehensive metabolic panel     Status: Abnormal   Collection Time: 06/08/17  5:46 PM  Result Value Ref Range   Sodium 126 (L) 135 - 145 mmol/L   Potassium 5.1 3.5 - 5.1 mmol/L   Chloride 85 (L) 101 - 111 mmol/L   CO2 16 (L) 22 - 32 mmol/L   Glucose, Bld 147 (H) 65 - 99 mg/dL   BUN 79 (H) 6 - 20 mg/dL   Creatinine, Ser 3.00 (H) 0.61 - 1.24 mg/dL   Calcium 7.9 (L) 8.9 - 10.3 mg/dL   Total Protein 7.4 6.5 - 8.1 g/dL   Albumin 2.3 (L) 3.5 - 5.0 g/dL   AST 187 (H) 15 - 41 U/L   ALT 107 (H) 17 - 63 U/L   Alkaline Phosphatase 190 (H) 38 - 126 U/L   Total Bilirubin 2.2 (H) 0.3 - 1.2 mg/dL   GFR calc non Af Amer 23 (L) >60 mL/min   GFR calc Af Amer 26 (L) >60 mL/min    Comment: (NOTE) The eGFR has been calculated using the CKD EPI equation. This calculation has not been validated in all clinical situations. eGFR's persistently <60 mL/min signify possible Chronic Kidney Disease.    Anion gap 25 (H) 5 - 15    Comment: QNS TO REPEAT  Lipase, blood     Status: None   Collection Time: 06/08/17  5:46 PM  Result Value Ref Range   Lipase 19 11 - 51 U/L  CBC with Differential     Status: Abnormal   Collection Time: 06/08/17  5:46 PM  Result Value Ref Range   WBC 33.5 (H) 4.0 - 10.5 K/uL   RBC 5.69 4.22 - 5.81 MIL/uL   Hemoglobin 16.4 13.0 -  17.0 g/dL   HCT 46.9 39.0 - 52.0 %   MCV 82.4 78.0 - 100.0 fL   MCH 28.8 26.0 - 34.0 pg   MCHC 35.0 30.0 - 36.0 g/dL   RDW 13.8 11.5 - 15.5 %    Platelets 134 (L) 150 - 400 K/uL   Neutrophils Relative % 89 %   Lymphocytes Relative 3 %   Monocytes Relative 1 %   Eosinophils Relative 0 %   Basophils Relative 0 %   Band Neutrophils 7 %   Neutro Abs 32.2 (H) 1.7 - 7.7 K/uL   Lymphs Abs 1.0 0.7 - 4.0 K/uL   Monocytes Absolute 0.3 0.1 - 1.0 K/uL   Eosinophils Absolute 0.0 0.0 - 0.7 K/uL   Basophils Absolute 0.0 0.0 - 0.1 K/uL   WBC Morphology TOXIC GRANULATION   Protime-INR     Status: Abnormal   Collection Time: 06/08/17  5:46 PM  Result Value Ref Range   Prothrombin Time 17.9 (H) 11.4 - 15.2 seconds   INR 1.49   Culture, blood (routine x 2)     Status: Abnormal (Preliminary result)   Collection Time: 06/08/17  5:46 PM  Result Value Ref Range   Specimen Description BLOOD RIGHT ANTECUBITAL    Special Requests      BOTTLES DRAWN AEROBIC AND ANAEROBIC Blood Culture adequate volume   Culture  Setup Time      IN BOTH AEROBIC AND ANAEROBIC BOTTLES GRAM POSITIVE COCCI IN CLUSTERS CRITICAL RESULT CALLED TO, READ BACK BY AND VERIFIED WITH: J.LEDFORD,PHARMD [id number] 06/09/17 M.CAMPBELL    Culture (A)     STAPHYLOCOCCUS AUREUS SUSCEPTIBILITIES TO FOLLOW    Report Status PENDING   T4, free     Status: None   Collection Time: 06/08/17  5:46 PM  Result Value Ref Range   Free T4 0.93 0.61 - 1.12 ng/dL    Comment: (NOTE) Biotin ingestion may interfere with free T4 tests. If the results are inconsistent with the TSH level, previous test results, or the clinical presentation, then consider biotin interference. If needed, order repeat testing after stopping biotin.   Blood Culture ID Panel (Reflexed)     Status: Abnormal   Collection Time: 06/08/17  5:46 PM  Result Value Ref Range   Enterococcus species NOT DETECTED NOT DETECTED   Listeria monocytogenes NOT DETECTED NOT DETECTED   Staphylococcus species DETECTED (A) NOT DETECTED    Comment: CRITICAL RESULT CALLED TO, READ BACK BY AND VERIFIED WITH: J.LEDFORD,PHARMD [id number] 06/09/17  M.CAMPBELL    Staphylococcus aureus DETECTED (A) NOT DETECTED    Comment: Methicillin (oxacillin) susceptible Staphylococcus aureus (MSSA). Preferred therapy is anti staphylococcal beta lactam antibiotic (Cefazolin or Nafcillin), unless clinically contraindicated. CRITICAL RESULT CALLED TO, READ BACK BY AND VERIFIED WITH: J.LEDFORD,PHARMD [id number] 06/09/17 M.CAMPBELL    Methicillin resistance NOT DETECTED NOT DETECTED   Streptococcus species NOT DETECTED NOT DETECTED   Streptococcus agalactiae NOT DETECTED NOT DETECTED   Streptococcus pneumoniae NOT DETECTED NOT DETECTED   Streptococcus pyogenes NOT DETECTED NOT DETECTED   Acinetobacter baumannii NOT DETECTED NOT DETECTED   Enterobacteriaceae species NOT DETECTED NOT DETECTED   Enterobacter cloacae complex NOT DETECTED NOT DETECTED   Escherichia coli NOT DETECTED NOT DETECTED   Klebsiella oxytoca NOT DETECTED NOT DETECTED   Klebsiella pneumoniae NOT DETECTED NOT DETECTED   Proteus species NOT DETECTED NOT DETECTED   Serratia marcescens NOT DETECTED NOT DETECTED   Haemophilus influenzae NOT DETECTED NOT DETECTED   Neisseria meningitidis NOT DETECTED NOT DETECTED  Pseudomonas aeruginosa NOT DETECTED NOT DETECTED   Candida albicans NOT DETECTED NOT DETECTED   Candida glabrata NOT DETECTED NOT DETECTED   Candida krusei NOT DETECTED NOT DETECTED   Candida parapsilosis NOT DETECTED NOT DETECTED   Candida tropicalis NOT DETECTED NOT DETECTED  I-stat troponin, ED     Status: Abnormal   Collection Time: 06/08/17  5:55 PM  Result Value Ref Range   Troponin i, poc 0.40 (HH) 0.00 - 0.08 ng/mL   Comment NOTIFIED PHYSICIAN    Comment 3            Comment: Due to the release kinetics of cTnI, a negative result within the first hours of the onset of symptoms does not rule out myocardial infarction with certainty. If myocardial infarction is still suspected, repeat the test at appropriate intervals.   I-stat Chem 8, ED     Status: Abnormal    Collection Time: 06/08/17  5:57 PM  Result Value Ref Range   Sodium 126 (L) 135 - 145 mmol/L   Potassium 5.5 (H) 3.5 - 5.1 mmol/L   Chloride 91 (L) 101 - 111 mmol/L   BUN 77 (H) 6 - 20 mg/dL   Creatinine, Ser 3.00 (H) 0.61 - 1.24 mg/dL   Glucose, Bld 152 (H) 65 - 99 mg/dL   Calcium, Ion 0.83 (LL) 1.15 - 1.40 mmol/L   TCO2 21 (L) 22 - 32 mmol/L   Hemoglobin 17.7 (H) 13.0 - 17.0 g/dL   HCT 52.0 39.0 - 52.0 %   Comment NOTIFIED PHYSICIAN   I-Stat CG4 Lactic Acid, ED     Status: Abnormal   Collection Time: 06/08/17  5:57 PM  Result Value Ref Range   Lactic Acid, Venous 8.50 (HH) 0.5 - 1.9 mmol/L   Comment NOTIFIED PHYSICIAN   I-Stat venous blood gas, ED     Status: Abnormal   Collection Time: 06/08/17  6:01 PM  Result Value Ref Range   pH, Ven 7.437 (H) 7.250 - 7.430   pCO2, Ven 33.8 (L) 44.0 - 60.0 mmHg   pO2, Ven 20.0 (LL) 32.0 - 45.0 mmHg   Bicarbonate 22.8 20.0 - 28.0 mmol/L   TCO2 24 22 - 32 mmol/L   O2 Saturation 35.0 %   Acid-base deficit 1.0 0.0 - 2.0 mmol/L   Patient temperature HIDE    Sample type VENOUS    Comment NOTIFIED PHYSICIAN   Urine culture     Status: Abnormal (Preliminary result)   Collection Time: 06/08/17  7:46 PM  Result Value Ref Range   Specimen Description URINE, CLEAN CATCH    Special Requests Normal    Culture >=100,000 COLONIES/mL UNIDENTIFIED ORGANISM (A)    Report Status PENDING   Respiratory Panel by PCR     Status: None   Collection Time: 06/08/17  7:46 PM  Result Value Ref Range   Adenovirus NOT DETECTED NOT DETECTED   Coronavirus 229E NOT DETECTED NOT DETECTED   Coronavirus HKU1 NOT DETECTED NOT DETECTED   Coronavirus NL63 NOT DETECTED NOT DETECTED   Coronavirus OC43 NOT DETECTED NOT DETECTED   Metapneumovirus NOT DETECTED NOT DETECTED   Rhinovirus / Enterovirus NOT DETECTED NOT DETECTED   Influenza A NOT DETECTED NOT DETECTED   Influenza B NOT DETECTED NOT DETECTED   Parainfluenza Virus 1 NOT DETECTED NOT DETECTED   Parainfluenza  Virus 2 NOT DETECTED NOT DETECTED   Parainfluenza Virus 3 NOT DETECTED NOT DETECTED   Parainfluenza Virus 4 NOT DETECTED NOT DETECTED   Respiratory Syncytial Virus  NOT DETECTED NOT DETECTED   Bordetella pertussis NOT DETECTED NOT DETECTED   Chlamydophila pneumoniae NOT DETECTED NOT DETECTED   Mycoplasma pneumoniae NOT DETECTED NOT DETECTED  I-Stat CG4 Lactic Acid, ED     Status: Abnormal   Collection Time: 06/08/17  7:52 PM  Result Value Ref Range   Lactic Acid, Venous 7.09 (HH) 0.5 - 1.9 mmol/L   Comment NOTIFIED PHYSICIAN   TSH     Status: None   Collection Time: 06/08/17  7:53 PM  Result Value Ref Range   TSH 1.817 0.350 - 4.500 uIU/mL    Comment: Performed by a 3rd Generation assay with a functional sensitivity of <=0.01 uIU/mL.  CSF cell count with differential collection tube #: 1     Status: Abnormal   Collection Time: 06/08/17 10:58 PM  Result Value Ref Range   Tube # 1    Color, CSF COLORLESS COLORLESS   Appearance, CSF HAZY (A) CLEAR   Supernatant NOT INDICATED    RBC Count, CSF 8 (H) 0 /cu mm   WBC, CSF 480 (HH) 0 - 5 /cu mm    Comment: CRITICAL RESULT CALLED TO, READ BACK BY AND VERIFIED WITH: W.MUNNETT,RN [id number] 06/09/17 M.CAMPBELL    Segmented Neutrophils-CSF 90 (H) 0 - 6 %   Lymphs, CSF 4 (L) 40 - 80 %   Monocyte-Macrophage-Spinal Fluid 6 (L) 15 - 45 %   Eosinophils, CSF 0 0 - 1 %  CSF cell count with differential collection tube #: 4     Status: Abnormal   Collection Time: 06/08/17 10:58 PM  Result Value Ref Range   Tube # 4    Color, CSF COLORLESS COLORLESS   Appearance, CSF HAZY (A) CLEAR   Supernatant NOT INDICATED    RBC Count, CSF 1 (H) 0 /cu mm   WBC, CSF 119 (HH) 0 - 5 /cu mm    Comment: CRITICAL RESULT CALLED TO, READ BACK BY AND VERIFIED WITH: W.MUNNETT,RN [id number] 06/09/17 M.CAMPBELL    Segmented Neutrophils-CSF 85 (H) 0 - 6 %   Lymphs, CSF 3 (L) 40 - 80 %   Monocyte-Macrophage-Spinal Fluid 12 (L) 15 - 45 %   Eosinophils, CSF 0 0 - 1 %  Glucose,  CSF     Status: None   Collection Time: 06/08/17 10:58 PM  Result Value Ref Range   Glucose, CSF 59 40 - 70 mg/dL  Protein, CSF     Status: Abnormal   Collection Time: 06/08/17 10:58 PM  Result Value Ref Range   Total  Protein, CSF 170 (H) 15 - 45 mg/dL    Comment: RESULTS CONFIRMED BY MANUAL DILUTION  VDRL, CSF     Status: None   Collection Time: 06/08/17 10:58 PM  Result Value Ref Range   VDRL Quant, CSF Non Reactive Non Rea:<1:1    Comment: (NOTE) Performed At: [facility] [place], [place] [id number] Rush Farmer MD [id number]   Pathologist smear review     Status: None   Collection Time: 06/08/17 10:58 PM  Result Value Ref Range   Path Review      Increased neutrophils, no malignant cells identified.    Comment: Reviewed by Marlynn Perking. Melina Copa, M.D. 06/09/2017   CSF culture     Status: None (Preliminary result)   Collection Time: 06/08/17 11:00 PM  Result Value Ref Range   Specimen Description CSF    Special Requests Normal    Culture NO GROWTH 2 DAYS    Report Status  PENDING   Gram stain     Status: None   Collection Time: 06/08/17 11:00 PM  Result Value Ref Range   Specimen Description CSF    Special Requests Normal    Gram Stain      CYTOSPIN SLIDE WBC PRESENT,BOTH PMN AND MONONUCLEAR NO ORGANISMS SEEN    Report Status 06/08/2017 FINAL   Comprehensive metabolic panel     Status: Abnormal   Collection Time: 06/08/17 11:37 PM  Result Value Ref Range   Sodium 129 (L) 135 - 145 mmol/L   Potassium 3.8 3.5 - 5.1 mmol/L   Chloride 96 (L) 101 - 111 mmol/L   CO2 18 (L) 22 - 32 mmol/L   Glucose, Bld 140 (H) 65 - 99 mg/dL   BUN 84 (H) 6 - 20 mg/dL   Creatinine, Ser 2.94 (H) 0.61 - 1.24 mg/dL   Calcium 6.6 (L) 8.9 - 10.3 mg/dL   Total Protein 5.1 (L) 6.5 - 8.1 g/dL   Albumin 1.6 (L) 3.5 - 5.0 g/dL   AST 205 (H) 15 - 41 U/L   ALT 98 (H) 17 - 63 U/L   Alkaline Phosphatase 105 38 - 126 U/L   Total Bilirubin 1.4 (H) 0.3 - 1.2 mg/dL   GFR calc  non Af Amer 23 (L) >60 mL/min   GFR calc Af Amer 27 (L) >60 mL/min    Comment: (NOTE) The eGFR has been calculated using the CKD EPI equation. This calculation has not been validated in all clinical situations. eGFR's persistently <60 mL/min signify possible Chronic Kidney Disease.    Anion gap 15 5 - 15  Troponin I     Status: Abnormal   Collection Time: 06/08/17 11:37 PM  Result Value Ref Range   Troponin I 0.36 (HH) <0.03 ng/mL    Comment: CRITICAL RESULT CALLED TO, READ BACK BY AND VERIFIED WITH: MUNNETT [facility] 06/09/17 0039 WAYK   Lactic acid, plasma     Status: None   Collection Time: 06/08/17 11:38 PM  Result Value Ref Range   Lactic Acid, Venous 1.8 0.5 - 1.9 mmol/L  Procalcitonin - Baseline     Status: None   Collection Time: 06/09/17 12:14 AM  Result Value Ref Range   Procalcitonin 81.75 ng/mL    Comment:        Interpretation: PCT >= 10 ng/mL: Important systemic inflammatory response, almost exclusively due to severe bacterial sepsis or septic shock. (NOTE)       Sepsis PCT Algorithm           Lower Respiratory Tract                                      Infection PCT Algorithm    ----------------------------     ----------------------------         PCT < 0.25 ng/mL                PCT < 0.10 ng/mL         Strongly encourage             Strongly discourage   discontinuation of antibiotics    initiation of antibiotics    ----------------------------     -----------------------------       PCT 0.25 - 0.50 ng/mL            PCT 0.10 - 0.25 ng/mL  OR       >80% decrease in PCT            Discourage initiation of                                            antibiotics      Encourage discontinuation           of antibiotics    ----------------------------     -----------------------------         PCT >= 0.50 ng/mL              PCT 0.26 - 0.50 ng/mL                AND       <80% decrease in PCT             Encourage initiation of                                              antibiotics       Encourage continuation           of antibiotics    ----------------------------     -----------------------------        PCT >= 0.50 ng/mL                  PCT > 0.50 ng/mL               AND         increase in PCT                  Strongly encourage                                      initiation of antibiotics    Strongly encourage escalation           of antibiotics                                     -----------------------------                                           PCT <= 0.25 ng/mL                                                 OR                                        > 80% decrease in PCT                                     Discontinue / Do not initiate  antibiotics   I-Stat arterial blood gas, ED     Status: Abnormal   Collection Time: 06/09/17 12:46 AM  Result Value Ref Range   pH, Arterial 7.500 (H) 7.350 - 7.450   pCO2 arterial 25.5 (L) 32.0 - 48.0 mmHg   pO2, Arterial 138.0 (H) 83.0 - 108.0 mmHg   Bicarbonate 19.9 (L) 20.0 - 28.0 mmol/L   TCO2 21 (L) 22 - 32 mmol/L   O2 Saturation 99.0 %   Acid-base deficit 2.0 0.0 - 2.0 mmol/L   Patient temperature 36.9 C    Collection site RADIAL, ALLEN'S TEST ACCEPTABLE    Drawn by RT    Sample type ARTERIAL   MRSA PCR Screening     Status: None   Collection Time: 06/09/17  3:15 AM  Result Value Ref Range   MRSA by PCR NEGATIVE NEGATIVE    Comment:        The GeneXpert MRSA Assay (FDA approved for NASAL specimens only), is one component of a comprehensive MRSA colonization surveillance program. It is not intended to diagnose MRSA infection nor to guide or monitor treatment for MRSA infections.   Glucose, capillary     Status: Abnormal   Collection Time: 06/09/17  3:24 AM  Result Value Ref Range   Glucose-Capillary 121 (H) 65 - 99 mg/dL  Basic metabolic panel     Status: Abnormal   Collection Time: 06/09/17  7:19 AM  Result Value Ref  Range   Sodium 131 (L) 135 - 145 mmol/L   Potassium 3.6 3.5 - 5.1 mmol/L   Chloride 98 (L) 101 - 111 mmol/L   CO2 20 (L) 22 - 32 mmol/L   Glucose, Bld 131 (H) 65 - 99 mg/dL   BUN 82 (H) 6 - 20 mg/dL   Creatinine, Ser 2.65 (H) 0.61 - 1.24 mg/dL   Calcium 6.7 (L) 8.9 - 10.3 mg/dL   GFR calc non Af Amer 26 (L) >60 mL/min   GFR calc Af Amer 30 (L) >60 mL/min    Comment: (NOTE) The eGFR has been calculated using the CKD EPI equation. This calculation has not been validated in all clinical situations. eGFR's persistently <60 mL/min signify possible Chronic Kidney Disease.    Anion gap 13 5 - 15  CBC     Status: Abnormal   Collection Time: 06/09/17  7:19 AM  Result Value Ref Range   WBC 20.6 (H) 4.0 - 10.5 K/uL   RBC 4.07 (L) 4.22 - 5.81 MIL/uL   Hemoglobin 11.5 (L) 13.0 - 17.0 g/dL    Comment: REPEATED TO VERIFY RESULTS VERIFIED VIA RECOLLECT    HCT 33.2 (L) 39.0 - 52.0 %   MCV 81.6 78.0 - 100.0 fL   MCH 28.3 26.0 - 34.0 pg   MCHC 34.6 30.0 - 36.0 g/dL   RDW 14.0 11.5 - 15.5 %   Platelets 75 (L) 150 - 400 K/uL    Comment: PLATELET COUNT CONFIRMED BY SMEAR  CK     Status: Abnormal   Collection Time: 06/09/17  7:19 AM  Result Value Ref Range   Total CK 6,435 (H) 49 - 397 U/L    Comment: RESULTS CONFIRMED BY MANUAL DILUTION  Hepatic function panel     Status: Abnormal   Collection Time: 06/09/17  7:19 AM  Result Value Ref Range   Total Protein 4.7 (L) 6.5 - 8.1 g/dL   Albumin 1.4 (L) 3.5 - 5.0 g/dL   AST 204 (H) 15 - 41 U/L   ALT  101 (H) 17 - 63 U/L   Alkaline Phosphatase 92 38 - 126 U/L   Total Bilirubin 1.1 0.3 - 1.2 mg/dL   Bilirubin, Direct 0.4 0.1 - 0.5 mg/dL   Indirect Bilirubin 0.7 0.3 - 0.9 mg/dL  HIV antibody     Status: None   Collection Time: 06/09/17  7:19 AM  Result Value Ref Range   HIV Screen 4th Generation wRfx Non Reactive Non Reactive    Comment: (NOTE) Performed At: [facility] [place], [place] [id number] Rush Farmer MD  [id number]   Lactic acid, plasma     Status: None   Collection Time: 06/09/17  7:19 AM  Result Value Ref Range   Lactic Acid, Venous 1.6 0.5 - 1.9 mmol/L  Magnesium     Status: None   Collection Time: 06/09/17  7:19 AM  Result Value Ref Range   Magnesium 2.4 1.7 - 2.4 mg/dL  Procalcitonin     Status: None   Collection Time: 06/09/17  7:19 AM  Result Value Ref Range   Procalcitonin 69.89 ng/mL    Comment:        Interpretation: PCT >= 10 ng/mL: Important systemic inflammatory response, almost exclusively due to severe bacterial sepsis or septic shock. (NOTE)       Sepsis PCT Algorithm           Lower Respiratory Tract                                      Infection PCT Algorithm    ----------------------------     ----------------------------         PCT < 0.25 ng/mL                PCT < 0.10 ng/mL         Strongly encourage             Strongly discourage   discontinuation of antibiotics    initiation of antibiotics    ----------------------------     -----------------------------       PCT 0.25 - 0.50 ng/mL            PCT 0.10 - 0.25 ng/mL               OR       >80% decrease in PCT            Discourage initiation of                                            antibiotics      Encourage discontinuation           of antibiotics    ----------------------------     -----------------------------         PCT >= 0.50 ng/mL              PCT 0.26 - 0.50 ng/mL                AND       <80% decrease in PCT             Encourage initiation of  antibiotics       Encourage continuation           of antibiotics    ----------------------------     -----------------------------        PCT >= 0.50 ng/mL                  PCT > 0.50 ng/mL               AND         increase in PCT                  Strongly encourage                                      initiation of antibiotics    Strongly encourage escalation           of antibiotics                                      -----------------------------                                           PCT <= 0.25 ng/mL                                                 OR                                        > 80% decrease in PCT                                     Discontinue / Do not initiate                                             antibiotics   Phosphorus     Status: Abnormal   Collection Time: 06/09/17  7:19 AM  Result Value Ref Range   Phosphorus 6.5 (H) 2.5 - 4.6 mg/dL  Troponin I     Status: Abnormal   Collection Time: 06/09/17  7:19 AM  Result Value Ref Range   Troponin I 0.29 (HH) <0.03 ng/mL    Comment: CRITICAL VALUE NOTED.  VALUE IS CONSISTENT WITH PREVIOUSLY REPORTED AND CALLED VALUE.  Glucose, capillary     Status: None   Collection Time: 06/09/17  9:10 AM  Result Value Ref Range   Glucose-Capillary 91 65 - 99 mg/dL   Comment 1 Notify RN    Comment 2 Document in Chart   Glucose, capillary     Status: None   Collection Time: 06/09/17 11:58 AM  Result Value Ref Range   Glucose-Capillary 92 65 - 99 mg/dL   Comment 1 Notify RN    Comment 2 Document in Chart   Troponin I (q 6hr  x 3)     Status: Abnormal   Collection Time: 06/09/17 12:24 PM  Result Value Ref Range   Troponin I 0.22 (HH) <0.03 ng/mL    Comment: CRITICAL VALUE NOTED.  VALUE IS CONSISTENT WITH PREVIOUSLY REPORTED AND CALLED VALUE.  Hepatitis panel, acute     Status: Abnormal   Collection Time: 06/09/17 12:24 PM  Result Value Ref Range   Hepatitis B Surface Ag Negative Negative   HCV Ab >11.0 (H) 0.0 - 0.9 s/co ratio    Comment: (NOTE)                                  Negative:     < 0.8                             Indeterminate: 0.8 - 0.9                                  Positive:     > 0.9 The CDC recommends that a positive HCV antibody result be followed up with a HCV Nucleic Acid Amplification test ([id number]). Performed At: [facility] [place], [place]  [id number] Rush Farmer MD BD:[id number]    Hep A IgM Negative Negative   Hep B C IgM Negative Negative  Glucose, capillary     Status: Abnormal   Collection Time: 06/09/17  4:26 PM  Result Value Ref Range   Glucose-Capillary 61 (L) 65 - 99 mg/dL   Comment 1 Notify RN    Comment 2 Document in Chart   Glucose, capillary     Status: Abnormal   Collection Time: 06/09/17  5:17 PM  Result Value Ref Range   Glucose-Capillary 132 (H) 65 - 99 mg/dL   Comment 1 Notify RN    Comment 2 Document in Chart   Troponin I (q 6hr x 3)     Status: Abnormal   Collection Time: 06/09/17  6:32 PM  Result Value Ref Range   Troponin I 0.24 (HH) <0.03 ng/mL    Comment: CRITICAL VALUE NOTED.  VALUE IS CONSISTENT WITH PREVIOUSLY REPORTED AND CALLED VALUE.  Glucose, capillary     Status: Abnormal   Collection Time: 06/09/17  8:05 PM  Result Value Ref Range   Glucose-Capillary 173 (H) 65 - 99 mg/dL  Glucose, capillary     Status: Abnormal   Collection Time: 06/09/17 11:18 PM  Result Value Ref Range   Glucose-Capillary 155 (H) 65 - 99 mg/dL  Glucose, capillary     Status: Abnormal   Collection Time: 06/10/17  4:02 AM  Result Value Ref Range   Glucose-Capillary 113 (H) 65 - 99 mg/dL  Procalcitonin     Status: None   Collection Time: 06/10/17  4:51 AM  Result Value Ref Range   Procalcitonin 43.77 ng/mL    Comment:        Interpretation: PCT >= 10 ng/mL: Important systemic inflammatory response, almost exclusively due to severe bacterial sepsis or septic shock. (NOTE)       Sepsis PCT Algorithm           Lower Respiratory Tract  Infection PCT Algorithm    ----------------------------     ----------------------------         PCT < 0.25 ng/mL                PCT < 0.10 ng/mL         Strongly encourage             Strongly discourage   discontinuation of antibiotics    initiation of antibiotics    ----------------------------     -----------------------------        PCT 0.25 - 0.50 ng/mL            PCT 0.10 - 0.25 ng/mL               OR       >80% decrease in PCT            Discourage initiation of                                            antibiotics      Encourage discontinuation           of antibiotics    ----------------------------     -----------------------------         PCT >= 0.50 ng/mL              PCT 0.26 - 0.50 ng/mL                AND       <80% decrease in PCT             Encourage initiation of                                             antibiotics       Encourage continuation           of antibiotics    ----------------------------     -----------------------------        PCT >= 0.50 ng/mL                  PCT > 0.50 ng/mL               AND         increase in PCT                  Strongly encourage                                      initiation of antibiotics    Strongly encourage escalation           of antibiotics                                     -----------------------------                                           PCT <= 0.25 ng/mL  OR                                        > 80% decrease in PCT                                     Discontinue / Do not initiate                                             antibiotics   Basic metabolic panel     Status: Abnormal   Collection Time: 06/10/17  4:51 AM  Result Value Ref Range   Sodium 129 (L) 135 - 145 mmol/L   Potassium 2.7 (LL) 3.5 - 5.1 mmol/L    Comment: CRITICAL RESULT CALLED TO, READ BACK BY AND VERIFIED WITH: ARMSTRONG J,RN 06/10/17 0607 WAYK    Chloride 100 (L) 101 - 111 mmol/L   CO2 17 (L) 22 - 32 mmol/L   Glucose, Bld 117 (H) 65 - 99 mg/dL   BUN 68 (H) 6 - 20 mg/dL   Creatinine, Ser 2.01 (H) 0.61 - 1.24 mg/dL   Calcium 6.3 (LL) 8.9 - 10.3 mg/dL    Comment: CRITICAL RESULT CALLED TO, READ BACK BY AND VERIFIED WITH: ARMSTRONG J,RN 06/10/17 0607 WAYK    GFR calc non Af Amer 37 (L) >60 mL/min   GFR calc Af  Amer 43 (L) >60 mL/min    Comment: (NOTE) The eGFR has been calculated using the CKD EPI equation. This calculation has not been validated in all clinical situations. eGFR's persistently <60 mL/min signify possible Chronic Kidney Disease.    Anion gap 12 5 - 15  CBC     Status: Abnormal   Collection Time: 06/10/17  4:51 AM  Result Value Ref Range   WBC 11.8 (H) 4.0 - 10.5 K/uL   RBC 3.64 (L) 4.22 - 5.81 MIL/uL   Hemoglobin 10.0 (L) 13.0 - 17.0 g/dL   HCT 29.6 (L) 39.0 - 52.0 %   MCV 81.3 78.0 - 100.0 fL   MCH 27.5 26.0 - 34.0 pg   MCHC 33.8 30.0 - 36.0 g/dL   RDW 14.0 11.5 - 15.5 %   Platelets 55 (L) 150 - 400 K/uL    Comment: CONSISTENT WITH PREVIOUS RESULT  Magnesium     Status: None   Collection Time: 06/10/17  4:51 AM  Result Value Ref Range   Magnesium 2.2 1.7 - 2.4 mg/dL  Phosphorus     Status: None   Collection Time: 06/10/17  4:51 AM  Result Value Ref Range   Phosphorus 4.1 2.5 - 4.6 mg/dL  Hemoglobin A1c     Status: Abnormal   Collection Time: 06/10/17  4:51 AM  Result Value Ref Range   Hgb A1c MFr Bld 5.9 (H) 4.8 - 5.6 %    Comment: (NOTE) Pre diabetes:          5.7%-6.4% Diabetes:              >6.4% Glycemic control for   <7.0% adults with diabetes    Mean Plasma Glucose 122.63 mg/dL  Lipid panel     Status: Abnormal   Collection Time: 06/10/17  4:51 AM  Result  Value Ref Range   Cholesterol 61 0 - 200 mg/dL   Triglycerides 82 <150 mg/dL   HDL <10 (L) >40 mg/dL    Comment: REPEATED TO VERIFY   Total CHOL/HDL Ratio NOT CALCULATED RATIO   VLDL 16 0 - 40 mg/dL   LDL Cholesterol NOT CALCULATED 0 - 99 mg/dL  Glucose, capillary     Status: Abnormal   Collection Time: 06/10/17  8:20 AM  Result Value Ref Range   Glucose-Capillary 124 (H) 65 - 99 mg/dL   Comment 1 Notify RN    Comment 2 Document in Chart     Ct Head Wo Contrast  Result Date: 06/08/2017 CLINICAL DATA:  Found unresponsive. EXAM: CT HEAD WITHOUT CONTRAST TECHNIQUE: Contiguous axial images were  obtained from the base of the skull through the vertex without intravenous contrast. COMPARISON:  None. FINDINGS: Brain: There is a small focus of low density in the superior right cerebellar hemisphere. There are additional small areas of patchy low-density which involve both cortex and white matter in the medial right parieto-occipital region and in the posterior right frontal lobe. These are concerning for acute or early subacute infarcts, with trace petechial hemorrhage not excluded though without a discrete parenchymal hematoma. No subarachnoid hemorrhage or extra-axial fluid collection is seen, and there is no midline shift. The ventricles are normal in size. Vascular: Calcified atherosclerosis at the skullbase. No hyperdense vessel. Skull: No fracture or focal osseous lesion. Sinuses/Orbits: Visualized paranasal sinuses and mastoid air cells are clear. Orbits are unremarkable. Other: None. IMPRESSION: Patchy hypodensities in the right cerebral hemisphere and right cerebellum concerning for acute/ early subacute infarcts. Consider MRI for further evaluation. Electronically Signed   By: Logan Bores M.D.   On: 06/08/2017 21:02   Mr Brain Wo Contrast  Result Date: 06/09/2017 CLINICAL DATA:  52 y/o M; found unresponsive with altered mental status. History of opiate abuse. EXAM: MRI HEAD WITHOUT CONTRAST TECHNIQUE: Multiplanar, multiecho pulse sequences of the brain and surrounding structures were obtained without intravenous contrast. COMPARISON:  06/08/2016 CT head. FINDINGS: Motion degradation on multiple sequences. Brain: Numerous small foci of reduced diffusion are present within right posterior frontal lobe, bilateral parietal lobes, right temporal lobe, right greater than left occipital lobes, splenium of corpus callosum, right thalamus, right midbrain, left paramedian pons, and scattered throughout right greater than left cerebellar hemispheres. Foci of reduced diffusion are compatible with acute/early  subacute infarction. Multiple vascular territories suggests embolic phenomenon. Infarctions are associated with T2 FLAIR hyperintense signal abnormality. No mass effect. Susceptibility weighted sequences are motion degraded, but within the larger right parietal and occipital lesions there is hypointensity without CT correlate indicating petechial hemorrhage. No hydrocephalus, extra-axial collection, effacement of basilar cisterns, or significant mass effect. Vascular: Normal flow voids. Skull and upper cervical spine: Normal marrow signal. Sinuses/Orbits: Negative. Other: None. IMPRESSION: Numerous small foci of acute/early subacute infarction are present involving right-greater-than-left posterior hemispheres, basal ganglia, brainstem, and cerebellum. Multiple vascular territories suggests embolic phenomenon. Petechial hemorrhage is present within the the larger right parietal and right occipital infarcts. No mass effect. These results will be called to the ordering clinician or representative by the Radiologist Assistant, and communication documented in the PACS or zVision Dashboard. Electronically Signed   By: Kristine Garbe M.D.   On: 06/09/2017 03:18   US Abdomen Complete  Result Date: 06/08/2017 CLINICAL DATA:  Sepsis with elevated liver function tests and bilirubin today. EXAM: ABDOMEN ULTRASOUND COMPLETE COMPARISON:  None. FINDINGS: Gallbladder: Intraluminal tumefactive biliary sludge is noted  within the gallbladder lumen. No wall thickening is identified. No pericholecystic fluid is noted. No sonographic Murphy sign noted by sonographer. Common bile duct: Diameter: 4.9 mm and within normal limits in caliber. No choledocholithiasis. Liver: No focal lesion identified. Within normal limits in parenchymal echogenicity. Portal vein is patent on color Doppler imaging with normal direction of blood flow towards the liver. IVC: No abnormality visualized. Pancreas: Visualized portion unremarkable.  Spleen: 11.1 x 13.2 x 5.8 cm (volume = 440 cm^3) without space-occupying mass. Right Kidney: Length: 11.5 cm. Echogenicity within normal limits. No mass or hydronephrosis visualized. Left Kidney: Length: 12.1 cm. Echogenicity within normal limits. No mass or hydronephrosis visualized. Abdominal aorta: No aneurysm visualized. Mild aortoiliac atherosclerosis. Other findings: None. IMPRESSION: 1. Biliary sludge noted within the gallbladder without acute findings of cholecystitis. No biliary dilatation. 2. Mild splenomegaly. Electronically Signed   By: Ashley Royalty M.D.   On: 06/08/2017 21:52   Dg Chest Port 1 View  Result Date: 06/08/2017 CLINICAL DATA:  Altered mental status EXAM: PORTABLE CHEST 1 VIEW COMPARISON:  02/15/2005 FINDINGS: The heart size and mediastinal contours are within normal limits. Both lungs are clear. The visualized skeletal structures are unremarkable. IMPRESSION: No active disease. Electronically Signed   By: Ashley Royalty M.D.   On: 06/08/2017 18:18   Dg Knee Complete 4 Views Left  Result Date: 06/09/2017 CLINICAL DATA:  52 y/o  M; knee injury with effusion. EXAM: LEFT KNEE - COMPLETE 4+ VIEW COMPARISON:  None. FINDINGS: No acute fracture or dislocation identified. Borderline patella alta. Mild medial and moderate lateral femorotibial compartment joint space narrowing and tricompartmental osteophytes. Large joint effusion. IMPRESSION: 1. Large joint effusion.  No acute fracture identified. 2. Osteoarthrosis of the knee greatest in lateral femorotibial compartment. 3. Borderline patella alta. Electronically Signed   By: Kristine Garbe M.D.   On: 06/09/2017 00:16    Review of Systems  Constitutional: Negative for weight loss.  HENT: Negative for ear discharge, ear pain, hearing loss and tinnitus.   Eyes: Negative for blurred vision, double vision, photophobia and pain.  Respiratory: Negative for cough, sputum production and shortness of breath.   Cardiovascular: Negative for  chest pain.  Gastrointestinal: Negative for abdominal pain, nausea and vomiting.  Genitourinary: Negative for dysuria, flank pain, frequency and urgency.  Musculoskeletal: Positive for joint pain (Left knee). Negative for back pain, falls, myalgias and neck pain.  Neurological: Negative for dizziness, tingling, sensory change, focal weakness, loss of consciousness and headaches.  Endo/Heme/Allergies: Does not bruise/bleed easily.  Psychiatric/Behavioral: Negative for depression, memory loss and substance abuse. The patient is not nervous/anxious.    Blood pressure 108/63, pulse 87, temperature 98.8 F (37.1 C), resp. rate (!) 23, height 6' (1.829 m), weight 76.6 kg (168 lb 14 oz), SpO2 99 %. Physical Exam  Constitutional: He appears well-developed and well-nourished. No distress.  HENT:  Head: Normocephalic.  Eyes: Conjunctivae are normal. Right eye exhibits no discharge. Left eye exhibits no discharge. No scleral icterus.  Neck: Normal range of motion.  Cardiovascular: Normal rate and regular rhythm.  Respiratory: Effort normal. No respiratory distress.  Musculoskeletal:  LLE No traumatic wounds, ecchymosis, or rash  Minimal TTP knee  Large knee effusion, no ankle effusion  Knee unable to range 2/2 severe pain, ~10 PROM  Sens DPN, SPN, TN intact  Motor EHL, ext, flex, evers 5/5  DP 2+, PT 2+, No significant edema  Neurological: He is alert.  Skin: Skin is warm and dry. He is not diaphoretic.  Psychiatric: He has a normal mood and affect. His behavior is normal.    Assessment/Plan: Left knee effusion -- Will aspirate and send for cultures, crystals. Will inject lidocaine/marcaine for attempt at symptom relief. No restrictions on WB, mobility from orthopedic standpoint. May need formal I&D in OR once stable for surgery. Multiple medical problems -- per primary team    Lisette Abu, PA-C Orthopedic Surgery [phone] 06/10/2017, 12:18 PM

## 2017-06-10 NOTE — Progress Notes (Addendum)
PULMONARY / CRITICAL CARE MEDICINE   Name: Marcus [name] MRN: [id number] DOB: 03/02/1966    ADMISSION DATE:  06/08/2017  CHIEF COMPLAINT: Altered mental status  HISTORY OF PRESENT ILLNESS:        This is a 52 year old who was found down.  He had CT is shown multiple defects in the right hemisphere and right cerebellum.  In addition he has a warm effusion of the right knee.  Blood cultures obtained last night are already growing staph aureus.  Was able to speak with family this morning, they deny a known history of IV drug abuse.  Toxicology screen on presentation was positive for cocaine opiates and benzodiazepines.  This morning he is extremely lethargic.  He will awaken and produce 1 or 2 sentences but only after noxious stimulation.  PAST MEDICAL HISTORY :  He  has no past medical history on file.  PAST SURGICAL HISTORY: He  has a past surgical history that includes Hip surgery; Ankle surgery; and Hip fracture surgery (Left, 1994).  No Known Allergies  No current facility-administered medications on file prior to encounter.    Current Outpatient Medications on File Prior to Encounter  Medication Sig  . acetaminophen (TYLENOL) 500 MG tablet Take 500-1,000 mg by mouth every 6 (six) hours as needed (for pain or headaches).  Marland Kitchen ibuprofen (ADVIL,MOTRIN) 200 MG tablet Take 200-600 mg by mouth every 6 (six) hours as needed (for pain or headaches).   Marland Kitchen amoxicillin-clavulanate (AUGMENTIN) 875-125 MG per tablet Take 1 tablet by mouth 2 (two) times daily. (Patient not taking: Reported on 06/08/2017)  . diazepam (VALIUM) 5 MG tablet Take 1 tablet (5 mg total) by mouth every 6 (six) hours as needed for muscle spasms. (Patient not taking: Reported on 06/08/2017)  . oxyCODONE-acetaminophen (PERCOCET/ROXICET) 5-325 MG tablet Take 1 tablet by mouth every 4 (four) hours as needed for severe pain (pain). (Patient not taking: Reported on 06/08/2017)    FAMILY HISTORY:  His has no family status information on  file.    SOCIAL HISTORY: He  reports that he quit smoking about 19 months ago. His smoking use included cigarettes. He has a 10.00 pack-year smoking history. he has never used smokeless tobacco. He reports that he drinks alcohol. He reports that he does not use drugs.  REVIEW OF SYSTEMS:   Unobtainable  SUBJECTIVE: Nurse contacted me yesterday afternoon regarding concerns for impending opiate withdrawal. The patient disclosed to the nurse that he abuses Oxycontin ("multiple doses a day"). Was not tachycardic but was tachypneic and demonstrated mild psychomotor agitation. Daughter was at the bedside. We discussed the patient's pattern of abuse and the patient was forthcoming about addiction. He is agreeable to a controlled taper. He also understood that "100% pain relief" was not the goal of starting a controlled taper. The patient is noted to have comfortable, nonlabored respirations.   VITAL SIGNS: BP 108/63   Pulse 87   Temp 98.8 F (37.1 C)   Resp (!) 23   Ht 6' (1.829 m)   Wt 76.6 kg (168 lb 14 oz)   SpO2 99%   BMI 22.90 kg/m   HEMODYNAMICS:    VENTILATOR SETTINGS:    INTAKE / OUTPUT: I/O last 3 completed shifts: In: 8445.8 [P.O.:600; I.V.:7195.8; IV Piggyback:650] Out: 3560 [Urine:3560]  PHYSICAL EXAMINATION: General: awake, alert, oriented to person and place. Much more interactive and intelligible compared to yesterday. Neuro: Pupils are equal at 2-3 mm and reactive, the face is symmetric.  He does follow some  instructions, appears to be weak in the left upper and lower extremity.   HEENT: Teeth are in very poor repair. Multiple missing and fractured upper teeth. Cardiovascular: S1 and S2 are regular after prolonged auscultation. NO MURMUR. No rub. No gallop. Lungs: Respirations are unlabored, there is symmetric air movement, no wheezes Abdomen: soft and flat without organomegaly masses tenderness guarding or rebound.  He is anicteric Musculoskeletal: large ballotable  warm effusion of the left knee Skin: I do not appreciate any tracks  LABS:  BMET Recent Labs  Lab 06/08/17 2337 06/09/17 0719 06/10/17 0451  NA 129* 131* 129*  K 3.8 3.6 2.7*  CL 96* 98* 100*  CO2 18* 20* 17*  BUN 84* 82* 68*  CREATININE 2.94* 2.65* 2.01*  GLUCOSE 140* 131* 117*    Electrolytes Recent Labs  Lab 06/08/17 2337 06/09/17 0719 06/10/17 0451  CALCIUM 6.6* 6.7* 6.3*  MG  --  2.4 2.2  PHOS  --  6.5* 4.1    CBC Recent Labs  Lab 06/08/17 1746 06/08/17 1757 06/09/17 0719 06/10/17 0451  WBC 33.5*  --  20.6* 11.8*  HGB 16.4 17.7* 11.5* 10.0*  HCT 46.9 52.0 33.2* 29.6*  PLT 134*  --  75* 55*    Coag's Recent Labs  Lab 06/08/17 1746  INR 1.49    Sepsis Markers Recent Labs  Lab 06/08/17 1952 06/08/17 2338 06/09/17 0014 06/09/17 0719 06/10/17 0451  LATICACIDVEN 7.09* 1.8  --  1.6  --   PROCALCITON  --   --  81.75 69.89 43.77    ABG Recent Labs  Lab 06/09/17 0046  PHART 7.500*  PCO2ART 25.5*  PO2ART 138.0*    Liver Enzymes Recent Labs  Lab 06/08/17 1746 06/08/17 2337 06/09/17 0719  AST 187* 205* 204*  ALT 107* 98* 101*  ALKPHOS 190* 105 92  BILITOT 2.2* 1.4* 1.1  ALBUMIN 2.3* 1.6* 1.4*    Cardiac Enzymes Recent Labs  Lab 06/09/17 0719 06/09/17 1224 06/09/17 1832  TROPONINI 0.29* 0.22* 0.24*    Glucose Recent Labs  Lab 06/09/17 1626 06/09/17 1717 06/09/17 2005 06/09/17 2318 06/10/17 0402 06/10/17 0820  GLUCAP 61* 132* 173* 155* 113* 124*    Imaging No results found.  ECHO (06/09/2017) - Left ventricle: The cavity size was normal; mild focal basal hypertrophy of the septum. Systolic function was normal. LVEF 60%-65%. Wall motion normal. Left ventricular diastolic function parameters were normal. - Mitral valve: There is a shaggy rounded mobile density on the anterior mitral valve leaflet measuring 1.31 x 1.56 cm consistent with vegetation.  Structurally normal valve.   Mobility was not restricted.  Doppler:   Transvalvular velocity was within the normal range. There was no evidence for stenosis. There was mild regurgitation.    Peak gradient (D): 3 mm Hg.  CULTURES: Blood culture obtained last night is growing staph aureus  ANTIBIOTICS: Rocephin and nafcillin  DISCUSSION: This is a 52 year old who was found unresponsive who has multiple defects in the right hemisphere and right cerebellum and a warm ballotable effusion in the left knee.  He is growing staph aureus from the blood and almost certainly this represents endocarditis.  ASSESSMENT / PLAN:  PULMONARY A: No active issue, there are no overt septic emboli on the chest x-ray  RENAL ACUTE KIDNEY INJURY, improving. Cr downtrending. HYPONATREMIA HYPOKALEMIA HYPOCALCEMIA Suspected to be secondary to endocarditis.  Ensuring adequate hydration. Check serum and urine osmolality.  GASTROINTESTINAL A: GI prophylaxis/Protonix P: Advance diet as tolerated.  INFECTIOUS A: ENDOCARDITIS/S aureus with  mitral valve vegetation demonstrated on echocardiographic interrogation P: Continue nafcillin. Repeat blood cultures. Will not pursue cardiothoracic surgery consultation at this time in the absence of clinical symptoms of overt heart failure. However, this patient will need orthopedic surgical consultation.  NEUROLOGIC A: Multiple emboli in different vascular territories with staph bacteremia almost certainly representing endocarditis.  Appropriate therapy is in place  MUSCULOSKELETAL A: L knee effusion, likely septic joint P: consult orthopedic surgery   Marcus SmilingSeong-Joo Xabi Wittler, MD Pulmonary and Critical Care Medicine [facility] Pager: [phone]  06/10/2017, 10:42 AM

## 2017-06-10 NOTE — Progress Notes (Signed)
NEUROHOSPITALISTS STROKE TEAM - DAILY PROGRESS NOTE   ADMISSION HISTORY: Marcus [name] is an 52 y.o. male who presented to the [facility] ED with AMS. Patient was last seen normal by his girlfriend at 12:00 on Sunday, but per his son, he had been appearing ill for several days prior. Sunday afternoon, girlfriend found him down on the ground on his right side, altered and unresponsive. He had been tachycardic for EMS with normal blood glucose. Received no medications prior to arrival to the ED.He does have a known history of opiate abuse.  SUBJECTIVE (INTERVAL HISTORY)  Family is at the bedside. Patient is  complaining of severe pain all over the body in his movements and neurological exam is limited by this.   OBJECTIVE Lab Results: CBC:  Recent Labs  Lab 06/08/17 1746 06/08/17 1757 06/09/17 0719 06/10/17 0451  WBC 33.5*  --  20.6* 11.8*  HGB 16.4 17.7* 11.5* 10.0*  HCT 46.9 52.0 33.2* 29.6*  MCV 82.4  --  81.6 81.3  PLT 134*  --  75* 55*   BMP: Recent Labs  Lab 06/08/17 1746 06/08/17 1757 06/08/17 2337 06/09/17 0719 06/10/17 0451 06/10/17 1249  NA 126* 126* 129* 131* 129* 130*  K 5.1 5.5* 3.8 3.6 2.7* 3.1*  CL 85* 91* 96* 98* 100* 101  CO2 16*  --  18* 20* 17* 20*  GLUCOSE 147* 152* 140* 131* 117* 108*  BUN 79* 77* 84* 82* 68* 58*  CREATININE 3.00* 3.00* 2.94* 2.65* 2.01* 1.89*  CALCIUM 7.9*  --  6.6* 6.7* 6.3* 6.8*  MG  --   --   --  2.4 2.2  --   PHOS  --   --   --  6.5* 4.1  --    Liver Function Tests:  Recent Labs  Lab 06/08/17 1746 06/08/17 2337 06/09/17 0719  AST 187* 205* 204*  ALT 107* 98* 101*  ALKPHOS 190* 105 92  BILITOT 2.2* 1.4* 1.1  PROT 7.4 5.1* 4.7*  ALBUMIN 2.3* 1.6* 1.4*   Recent Labs  Lab 06/08/17 1746  LIPASE 19   Thyroid Function Studies:  Recent Labs    06/08/17 1953  TSH 1.817   Cardiac Enzymes:  Recent Labs  Lab 06/08/17 1746 06/08/17 2337 06/09/17 0719 06/09/17 1224  06/09/17 1832  CKTOTAL 791*  --  6,435*  --   --   TROPONINI  --  0.36* 0.29* 0.22* 0.24*   Coagulation Studies:  Recent Labs    06/08/17 1746  INR 1.49   Urine Drug Screen:     Component Value Date/Time   LABOPIA POSITIVE (A) 06/08/2017 1724   COCAINSCRNUR POSITIVE (A) 06/08/2017 1724   LABBENZ POSITIVE (A) 06/08/2017 1724   AMPHETMU NONE DETECTED 06/08/2017 1724   THCU NONE DETECTED 06/08/2017 1724   LABBARB NONE DETECTED 06/08/2017 1724    PHYSICAL EXAM Temp:  [98.6 F (37 C)-100 F (37.8 C)] 98.8 F (37.1 C) (01/08 1000) Pulse Rate:  [78-91] 87 (01/08 1000) Resp:  [14-30] 23 (01/08 1000) BP: (100-111)/(56-66) 108/63 (01/08 1000) SpO2:  [97 %-100 %] 99 % (01/08 1000) Weight:  [168 lb 14 oz (76.6 kg)] 168 lb 14 oz (76.6 kg) (01/08 0530) General - Well nourished, well developed young Caucasian male, in no apparent distress HEENT-  Normocephalic, Normal external eye/conjunctiva.  Normal external ears. Normal external nose, mucus membranes and septum.   Cardiovascular - Regular rate and rhythm  Respiratory - Lungs clear bilaterally. No wheezing. Abdomen - soft and non-tender, BS  normal Extremities- Left knee joint effusion is warm to the touch with adjacent swelling. Mental Status:  awake alert.   . Oriented x 3. Minimal purposeful movements but will move all extremities to noxious stimuli .  Cranial Nerves: II:  Blinks to threat only in temporal visual field of right eye. No blink to threat OS. Pupils small and slightly irregular bilaterally with sluggish reactivity.  III,IV, VI: Eyelids closed. Eyes are conjugate with weak/minimal roving EOM.  V,VII: Face symmetric with weak grimace to noxious.  VIII: hearing appear intacat IX,X: Unable to assess XI: Unable to assess XII: Tongue appear midline Motor/Sensory: Minimal 1-2/5 motor responses to noxious stimuli on Left. Right sided strength appears 4/5 throughout. Cerebellar/Gait: Unable to assess.   IMAGING: I have  personally reviewed the radiological images below and agree with the radiology interpretations. Ct Head Wo Contrast Result Date: 06/08/2017 IMPRESSION: Patchy hypodensities in the right cerebral hemisphere and right cerebellum concerning for acute/ early subacute infarcts. Consider MRI for further evaluation.   Mr Brain Wo Contrast Result Date: 06/09/2017 IMPRESSION: Numerous small foci of acute/early subacute infarction are present involving right-greater-than-left posterior hemispheres, basal ganglia, brainstem, and cerebellum. Multiple vascular territories suggests embolic phenomenon. Petechial hemorrhage is present within the the larger right parietal and right occipital infarcts. No mass effect.   Echocardiogram:                                               Study Conclusions - Left ventricle: The cavity size was normal. There was mild focal   basal hypertrophy of the septum. Systolic function was normal.   The estimated ejection fraction was in the range of 60% to 65%.   Wall motion was normal; there were no regional wall motion   abnormalities. Left ventricular diastolic function parameters   were normal. - Mitral valve: There was mild regurgitation. - Pulmonary arteries: Systolic pressure could not be accurately   estimated. Impressions:- There was a vegetation, consistent with endocarditis.    IMPRESSION: Marcus [name] is a 52 y.o. male with no notable PMH, history of opiate and cocaine use prior to admission, brought into hospital with reports of unresponsiveness and acute onset right sided weakness. MRI reveals:  Numerous small foci of acute/early subacute infarction right-greater-than-left posterior hemispheres, basal ganglia, brainstem, and cerebellum.  Petechial hemorrhage larger right parietal and right occipital infarcts.  Suspected Etiology: septic emboli from endocarditis   Resultant Symptoms: Left sided weakness, Left field cut, dysarthria, dysphagia Stroke Risk  Factors: smoking, Poly substance abuse Other Stroke Risk Factors: Cigarette smoker, ETOH use  Outstanding Stroke Work-up Studies:    Workup completed at this time  06/10/2017 ASSESSMENT:   Neuro exam stable. More oriented and following commands on today's exam. No further stroke work up needed at this time. ECHO + for vegetation/endocarditis. Medical management per CCM and Cardiology.  continue to hold ASA for now. No need for repeat imaging unless neuro exam worsens.  PLAN  06/10/2017:    Long discussion with the patient's mother at the bedside and answered questions. PT/OT/SLP Consult PM & Rehab Consult Case Management /MSW Will likely need TEE once medically stable Ongoing aggressive stroke risk factor management Patient will be counseled to be compliant with his antithrombotic medications Patient will be counseled on Lifestyle modifications including, Diet, Exercise, and Stress Follow up with [facility] in  6 weeks  DYSPHAGIA: NPO until passes SLP swallow evaluation Aspiration Precautions in progress  MEDICAL ISSUES: CCM Team MSSA bacteremia  Meningitis due to bacterimia Septic embolism (HCC) Endocarditis of mitral valve Swelling of left knee joint Sepsis (HCC) AKI (acute kidney injury) (HCC) Elevated liver enzymes Normocytic anemia Thrombocytopenia (HCC)  HYPERTENSION: Stable SBP goal of < 160. DBP goal of < 105.  Nicardipine drip, Labetolol PRN Long term BP goal normotensive. May slowly start B/P medications after 48 hours, if applicable Home Meds: NONE  HYPERLIPIDEMIA:    Component Value Date/Time   CHOL 61 06/10/2017 0451   TRIG 82 06/10/2017 0451   HDL <10 (L) 06/10/2017 0451   CHOLHDL NOT CALCULATED 06/10/2017 0451   VLDL 16 06/10/2017 0451   LDLCALC NOT CALCULATED 06/10/2017 0451     - PENDING Home Meds:  NONE LDL  goal < 70 Will start on daily statin, if necessary Continue statin at discharge  DIABETES: Lab Results  Component Value  Date   HGBA1C 5.9 (H) 06/10/2017   -PENDING Recent Labs  Lab 06/09/17 2005 06/09/17 2318 06/10/17 0402 06/10/17 0820 06/10/17 1223  GLUCAP 173* 155* 113* 124* 104*  HgbA1c goal < 7.0 Currently on: NovoLog as needed Continue CBG monitoring and SSI to maintain glucose 140-180 mg/dl  TOBACCO ABUSE & POLYSUBSTANCE ABUSE UDS+ Current smoker Smoking cessation counseling provided Nicotine patch provided  Other Active Problems: Principal Problem:   Bacteremia due to methicillin susceptible Staphylococcus aureus (MSSA) Active Problems:   Encephalopathy   Meningitis due to bacteria   Septic embolism (HCC)   Sepsis (HCC)   AKI (acute kidney injury) (HCC)   Elevated liver enzymes   Normocytic anemia   Thrombocytopenia (HCC)   Polysubstance abuse (HCC)   Endocarditis of mitral valve   Swelling of left knee joint   Poor dentition   Hepatitis C antibody test positive    Hospital day # 1 VTE prophylaxis: SCD's  Diet : Diet Heart Room service appropriate? Yes; Fluid consistency: Thin   FAMILY UPDATES: No family at bedside  TEAM UPDATES: Oretha MilchAlva, Rakesh V, MD   Prior Home Stroke Medications:  No antithrombotic  Discharge Stroke Meds:  Please discharge patient on TBD   Disposition: 01-Home or Self Care Therapy Recs:               PENDING Home Equipment:         PENDING Follow Up:  Follow-up Information    Micki RileySethi, Pramod S, MD. Schedule an appointment as soon as possible for a visit in 6 week(s).   Specialties:  Neurology, Radiology Contact information: [address] [place] [address] [phone]          Patient, No Pcp Per -PCP Follow up in 1-2 weeks   Case Management aware of need     onally examined this patient, reviewed notes, independently viewed imaging studies, participated in medical decision making and plan of care.ROS completed by me personally and pertinent positives fully documented  I have made any additions or clarifications directly to  the above note.  Marland Kitchen. He presented with encephalopathy secondary to multiple bilateral emboli and septicemia and bacterial meningitis from staph aureus infection likely with underlying endocarditis. Continue antibiotics as per infectious disease. Check transesophageal echocardiogram when medically stable enough for the procedure. Long discussion at the bedside with multiple family members and answered questions. This patient is critically ill and at significant risk of neurological worsening, death and care requires constant monitoring of vital signs, hemodynamics,respiratory  and cardiac monitoring, extensive review of multiple databases, frequent neurological assessment, discussion with family, other specialists and medical decision making of high complexity.I have made any additions or clarifications directly to the above note.This critical care time does not reflect procedure time, or teaching time or supervisory time of PA/NP/Med Resident etc but could involve care discussion time.  I spent 30 minutes of neurocritical care time  in the care of  this patient.   Stroke team will sign off. Kindly call for questions.   Delia Heady, MD Medical Director [facility] Pager: [phone] 06/10/2017 3:05 PM  To contact Stroke Continuity provider, please refer to [email or web address]. After hours, contact General Neurology

## 2017-06-11 ENCOUNTER — Encounter (HOSPITAL_COMMUNITY)
Admission: EM | Disposition: A | Discharge status: Skilled Nursing Facility | Payer: Self-pay | Source: Home / Self Care | Attending: Internal Medicine

## 2017-06-11 ENCOUNTER — Encounter (HOSPITAL_COMMUNITY): Payer: Self-pay | Admitting: *Deleted

## 2017-06-11 ENCOUNTER — Inpatient Hospital Stay (HOSPITAL_COMMUNITY): Payer: Medicaid Other | Admitting: Critical Care Medicine

## 2017-06-11 DIAGNOSIS — E875 Hyperkalemia: Secondary | ICD-10-CM

## 2017-06-11 DIAGNOSIS — R4182 Altered mental status, unspecified: Secondary | ICD-10-CM

## 2017-06-11 DIAGNOSIS — I330 Acute and subacute infective endocarditis: Secondary | ICD-10-CM

## 2017-06-11 DIAGNOSIS — M009 Pyogenic arthritis, unspecified: Secondary | ICD-10-CM

## 2017-06-11 DIAGNOSIS — M00062 Staphylococcal arthritis, left knee: Secondary | ICD-10-CM

## 2017-06-11 HISTORY — PX: IRRIGATION AND DEBRIDEMENT KNEE: SHX5185

## 2017-06-11 LAB — CULTURE, BLOOD (ROUTINE X 2)
SPECIAL REQUESTS: ADEQUATE
Special Requests: ADEQUATE

## 2017-06-11 LAB — GLUCOSE, CAPILLARY
GLUCOSE-CAPILLARY: 90 mg/dL (ref 65–99)
GLUCOSE-CAPILLARY: 157 mg/dL — AB (ref 65–99)
Glucose-Capillary: 72 mg/dL (ref 65–99)
Glucose-Capillary: 104 mg/dL — ABNORMAL HIGH (ref 65–99)

## 2017-06-11 LAB — SURGICAL PCR SCREEN
MRSA, PCR: NEGATIVE
STAPHYLOCOCCUS AUREUS: POSITIVE — AB

## 2017-06-11 LAB — OSMOLALITY, URINE: OSMOLALITY UR: 375 mosm/kg (ref 300–900)

## 2017-06-11 LAB — HCV RNA QUANT
HCV Quantitative Log: 5.35 log10 IU/mL (ref >1.70)
HCV Quantitative: 224000 IU/mL (ref >50)

## 2017-06-11 LAB — OSMOLALITY: Osmolality: 293 mOsm/kg (ref 275–295)

## 2017-06-11 LAB — PROCALCITONIN: PROCALCITONIN: 26.6 ng/mL

## 2017-06-11 LAB — URINE CULTURE
Culture: >=100000 — AB
Special Requests: NORMAL

## 2017-06-11 LAB — OCCULT BLOOD X 1 CARD TO LAB, STOOL: FECAL OCCULT BLD: NEGATIVE

## 2017-06-11 SURGERY — IRRIGATION AND DEBRIDEMENT KNEE
Anesthesia: General | Laterality: Left

## 2017-06-11 MED ORDER — PROPOFOL 10 MG/ML IV BOLUS
INTRAVENOUS | Status: AC
  Filled 2017-06-11: qty 20

## 2017-06-11 MED ORDER — FENTANYL CITRATE (PF) 250 MCG/5ML IJ SOLN
INTRAMUSCULAR | Status: AC
  Filled 2017-06-11: qty 5

## 2017-06-11 MED ORDER — MIDAZOLAM HCL 5 MG/5ML IJ SOLN
INTRAMUSCULAR | Status: DC | PRN
  Administered 2017-06-11 (×2): 1 mg via INTRAVENOUS

## 2017-06-11 MED ORDER — OXYCODONE HCL 5 MG PO TABS: 5.0000 mg | ORAL_TABLET | Freq: Once | ORAL | Status: DC | PRN

## 2017-06-11 MED ORDER — VANCOMYCIN HCL 1000 MG IV SOLR
INTRAVENOUS | Status: AC
  Filled 2017-06-11: qty 1000

## 2017-06-11 MED ORDER — ONDANSETRON HCL 4 MG/2ML IJ SOLN: 4.0000 mg | Freq: Four times a day (QID) | INTRAMUSCULAR | Status: DC | PRN

## 2017-06-11 MED ORDER — SODIUM CHLORIDE 0.9 % IR SOLN
Status: DC | PRN
  Administered 2017-06-11 (×2): 3000 mL

## 2017-06-11 MED ORDER — LIDOCAINE 2% (20 MG/ML) 5 ML SYRINGE
INTRAMUSCULAR | Status: DC | PRN
  Administered 2017-06-11: 100 mg via INTRAVENOUS

## 2017-06-11 MED ORDER — HYDROMORPHONE HCL 1 MG/ML IJ SOLN: 0.2500 mg | INTRAMUSCULAR | Status: DC | PRN

## 2017-06-11 MED ORDER — ETOMIDATE 2 MG/ML IV SOLN
INTRAVENOUS | Status: DC | PRN
  Administered 2017-06-11: 14 mg via INTRAVENOUS

## 2017-06-11 MED ORDER — OXYCODONE HCL 5 MG/5ML PO SOLN: 5.0000 mg | Freq: Once | ORAL | Status: DC | PRN

## 2017-06-11 MED ORDER — PHENYLEPHRINE HCL 10 MG/ML IJ SOLN
INTRAVENOUS | Status: DC | PRN
  Administered 2017-06-11: 25 ug/min via INTRAVENOUS

## 2017-06-11 MED ORDER — 0.9 % SODIUM CHLORIDE (POUR BTL) OPTIME
TOPICAL | Status: DC | PRN
  Administered 2017-06-11: 1000 mL

## 2017-06-11 MED ORDER — KETAMINE HCL-SODIUM CHLORIDE 100-0.9 MG/10ML-% IV SOSY
PREFILLED_SYRINGE | INTRAVENOUS | Status: AC
  Filled 2017-06-11: qty 10

## 2017-06-11 MED ORDER — LACTATED RINGERS IV SOLN
INTRAVENOUS | Status: DC
  Administered 2017-06-11: 14:00:00 via INTRAVENOUS

## 2017-06-11 MED ORDER — KETAMINE HCL 10 MG/ML IJ SOLN
INTRAMUSCULAR | Status: DC | PRN
  Administered 2017-06-11: 30 mg via INTRAVENOUS

## 2017-06-11 MED ORDER — FENTANYL CITRATE (PF) 250 MCG/5ML IJ SOLN
INTRAMUSCULAR | Status: DC | PRN
  Administered 2017-06-11 (×3): 25 ug via INTRAVENOUS
  Administered 2017-06-11 (×2): 50 ug via INTRAVENOUS
  Administered 2017-06-11 (×2): 25 ug via INTRAVENOUS
  Administered 2017-06-11: 50 ug via INTRAVENOUS
  Administered 2017-06-11: 25 ug via INTRAVENOUS
  Administered 2017-06-11: 50 ug via INTRAVENOUS

## 2017-06-11 MED ORDER — ONDANSETRON HCL 4 MG/2ML IJ SOLN
INTRAMUSCULAR | Status: DC | PRN
  Administered 2017-06-11: 4 mg via INTRAVENOUS

## 2017-06-11 MED ORDER — MIDAZOLAM HCL 2 MG/2ML IJ SOLN
INTRAMUSCULAR | Status: AC
  Filled 2017-06-11: qty 2

## 2017-06-11 MED ORDER — DEXAMETHASONE SODIUM PHOSPHATE 10 MG/ML IJ SOLN
INTRAMUSCULAR | Status: DC | PRN
  Administered 2017-06-11: 4 mg via INTRAVENOUS

## 2017-06-11 SURGICAL SUPPLY — 43 items
BANDAGE ACE 6X5 VEL STRL LF (GAUZE/BANDAGES/DRESSINGS) ×2 IMPLANT
BNDG COHESIVE 6X5 TAN STRL LF (GAUZE/BANDAGES/DRESSINGS) ×3 IMPLANT
BNDG GAUZE ELAST 4 BULKY (GAUZE/BANDAGES/DRESSINGS) ×2 IMPLANT
COVER SURGICAL LIGHT HANDLE (MISCELLANEOUS) ×3 IMPLANT
CUFF TOURNIQUET SINGLE 34IN LL (TOURNIQUET CUFF) ×2 IMPLANT
CUFF TOURNIQUET SINGLE 44IN (TOURNIQUET CUFF) ×1 IMPLANT
DRAPE IMP U-DRAPE 54X76 (DRAPES) ×3 IMPLANT
DRAPE ORTHO SPLIT 77X108 STRL (DRAPES) ×3
DRAPE SURG ORHT 6 SPLT 77X108 (DRAPES) ×1 IMPLANT
ELECT CAUTERY BLADE 6.4 (BLADE) ×4 IMPLANT
ELECT REM PT RETURN 9FT ADLT (ELECTROSURGICAL) ×3
ELECTRODE REM PT RTRN 9FT ADLT (ELECTROSURGICAL) ×1 IMPLANT
FACESHIELD WRAPAROUND (MASK) IMPLANT
FACESHIELD WRAPAROUND OR TEAM (MASK) IMPLANT
GAUZE SPONGE 4X4 12PLY STRL (GAUZE/BANDAGES/DRESSINGS) ×2 IMPLANT
GAUZE XEROFORM 1X8 LF (GAUZE/BANDAGES/DRESSINGS) ×2 IMPLANT
GLOVE SKINSENSE NS SZ7.5 (GLOVE) ×8
GLOVE SKINSENSE STRL SZ7.5 (GLOVE) ×4 IMPLANT
GOWN STRL REIN XL XLG (GOWN DISPOSABLE) ×6 IMPLANT
HANDPIECE INTERPULSE COAX TIP (DISPOSABLE)
KIT BASIN OR (CUSTOM PROCEDURE TRAY) ×3 IMPLANT
KIT ROOM TURNOVER OR (KITS) ×3 IMPLANT
MANIFOLD NEPTUNE II (INSTRUMENTS) ×3 IMPLANT
NS IRRIG 1000ML POUR BTL (IV SOLUTION) ×3 IMPLANT
PACK ORTHO EXTREMITY (CUSTOM PROCEDURE TRAY) ×3 IMPLANT
PACK UNIVERSAL I (CUSTOM PROCEDURE TRAY) ×3 IMPLANT
PAD ABD 8X10 STRL (GAUZE/BANDAGES/DRESSINGS) ×4 IMPLANT
PAD ARMBOARD 7.5X6 YLW CONV (MISCELLANEOUS) ×6 IMPLANT
SET HNDPC FAN SPRY TIP SCT (DISPOSABLE) ×1 IMPLANT
SPONGE LAP 18X18 X RAY DECT (DISPOSABLE) ×1 IMPLANT
STOCKINETTE IMPERVIOUS 9X36 MD (GAUZE/BANDAGES/DRESSINGS) ×3 IMPLANT
SUT ETHILON 2 0 FS 18 (SUTURE) ×3 IMPLANT
SUT MON AB 2-0 CT1 36 (SUTURE) ×3 IMPLANT
SUT PDS 1 0S 6 (SUTURE) ×2 IMPLANT
SWAB CULTURE ESWAB REG 1ML (MISCELLANEOUS) ×3 IMPLANT
TOWEL OR 17X24 6PK STRL BLUE (TOWEL DISPOSABLE) ×3 IMPLANT
TOWEL OR 17X26 10 PK STRL BLUE (TOWEL DISPOSABLE) ×3 IMPLANT
TUBE CONNECTING 12'X1/4 (SUCTIONS) ×1
TUBE CONNECTING 12X1/4 (SUCTIONS) ×2 IMPLANT
TUBING CYSTO DISP (UROLOGICAL SUPPLIES) ×2 IMPLANT
UNDERPAD 30X30 (UNDERPADS AND DIAPERS) ×3 IMPLANT
WATER STERILE IRR 1000ML POUR (IV SOLUTION) ×3 IMPLANT
YANKAUER SUCT BULB TIP NO VENT (SUCTIONS) ×3 IMPLANT

## 2017-06-11 NOTE — H&P (Signed)
H&P update  The surgical history has been reviewed and remains accurate without interval change.  The patient was re-examined and patient's physiologic condition has not changed significantly in the last 30 days. The condition still exists that makes this procedure necessary. The treatment plan remains the same, without new options for care.  No new pharmacological allergies or types of therapy has been initiated that would change the plan or the appropriateness of the plan.  The patient and/or family understand the potential benefits and risks.  Mayra ReelN. Michael Xu, MD 06/11/2017 12:10 PM

## 2017-06-11 NOTE — Transfer of Care (Signed)
Immediate Anesthesia Transfer of Care Note  Patient: Marcus [name]  Procedure(s) Performed: IRRIGATION AND DEBRIDEMENT KNEE (Left )  Patient Location: PACU  Anesthesia Type:General  Level of Consciousness: sedated  Airway & Oxygen Therapy: Patient Spontanous Breathing and Patient connected to face mask oxygen  Post-op Assessment: Report given to RN and Post -op Vital signs reviewed and stable  Post vital signs: Reviewed and stable  Last Vitals:  Vitals:   06/11/17 1200 06/11/17 1300  BP: 121/64 130/69  Pulse: 95 (!) 101  Resp: (!) 26 (!) 23  Temp: 37.1 C   SpO2: 99% 97%    Last Pain:  Vitals:   06/11/17 1200  TempSrc: Oral  PainSc: 10-Worst pain ever      Patients Stated Pain Goal: 0 (06/11/17 1200)  Complications: No apparent anesthesia complications

## 2017-06-11 NOTE — Progress Notes (Signed)
PCCM Interval Note  Patient continues to be hemodynamically stable, alert but confused, neurological without changes today. Will transfer to SDU and TRH to assume primary care 1/10.  Posey BoyerBrooke Simpson, AGACNP-BC Between Pulmonary & Critical Care Pgr: [phone] or if no answer [phone] 06/11/2017, 7:32 PM

## 2017-06-11 NOTE — Anesthesia Preprocedure Evaluation (Addendum)
Anesthesia Evaluation  Patient identified by MRN, date of birth, ID band Patient awake    Reviewed: Allergy & Precautions, H&P , NPO status , Patient's Chart, lab work & pertinent test results  History of Anesthesia Complications Negative for: history of anesthetic complications  Airway Mallampati: II   Neck ROM: full    Dental  (+) Poor Dentition   Pulmonary former smoker,    breath sounds clear to auscultation       Cardiovascular  Rhythm:regular Rate:Normal  MSSA bacteremia.  Endocarditis with mitral valve vegetation.   Neuro/Psych Meningitis from septic emboli? negative psych ROS   GI/Hepatic negative GI ROS, (+)     substance abuse  , Hepatitis -, C  Endo/Other    Renal/GU ARFRenal disease     Musculoskeletal  (+) Arthritis ,   Abdominal   Peds  Hematology  (+) anemia , Thrombocytopenia.  PLTS 55   Anesthesia Other Findings   Reproductive/Obstetrics                           Anesthesia Physical Anesthesia Plan  ASA: III  Anesthesia Plan: General   Post-op Pain Management:    Induction: Intravenous  PONV Risk Score and Plan: 2 and Ondansetron, Dexamethasone, Midazolam and Treatment may vary due to age or medical condition  Airway Management Planned: LMA  Additional Equipment:   Intra-op Plan:   Post-operative Plan: Possible Post-op intubation/ventilation  Informed Consent: I have reviewed the patients History and Physical, chart, labs and discussed the procedure including the risks, benefits and alternatives for the proposed anesthesia with the patient or authorized representative who has indicated his/her understanding and acceptance.     Plan Discussed with: CRNA, Anesthesiologist and Surgeon  Anesthesia Plan Comments:        Anesthesia Quick Evaluation

## 2017-06-11 NOTE — Progress Notes (Signed)
Patient ID: Marcus [name], male   DOB: 07/12/1965, 52 y.o.   MRN: [id number]          [facility]  Date of Admission:  06/08/2017           Day 3 nafcillin ASSESSMENT: He has MSSA bacteremia complicated by mitral valve endocarditis, septic left knee and septic emboli to the brain causing early cerebritis and meningitis.  Repeat blood cultures and CSF cultures are negative so far..  Transthoracic echocardiogram shows a 1.3 x 1.6 cm mitral valve vegetation.  There is no significant mitral regurgitation.  I will continue nafcillin.  He needs at least 6 weeks of IV nafcillin therapy.  PLAN: 1. Continue nafcillin 2. Await results of repeat blood and CSF cultures, Hepatitis C genotype and viral load  Principal Problem:   Bacteremia due to methicillin susceptible Staphylococcus aureus (MSSA) Active Problems:   Meningitis due to bacteria   Septic embolism (HCC)   Endocarditis of mitral valve   Septic arthritis of knee, left (HCC)   Hepatitis C antibody test positive   Encephalopathy   Sepsis (HCC)   AKI (acute kidney injury) (HCC)   Elevated liver enzymes   Normocytic anemia   Thrombocytopenia (HCC)   Polysubstance abuse (HCC)   Poor dentition   Acute bacterial endocarditis   Altered mental status   Hyperkalemia   Scheduled Meds: . bupivacaine  10 mL Infiltration Once  . lidocaine  5 mL Other Once  . pantoprazole (PROTONIX) IV  40 mg Intravenous Q24H  . thiamine injection  100 mg Intravenous Daily   Continuous Infusions: . sodium chloride    . sodium chloride 125 mL/hr at 06/11/17 1100  . nafcillin IV Stopped (06/11/17 0935)   PRN Meds:.sodium chloride, acetaminophen, morphine injection   Review of Systems: Review of Systems  Unable to perform ROS: Mental acuity    No Known Allergies  OBJECTIVE: Vitals:   06/11/17 0800 06/11/17 0900 06/11/17 1000 06/11/17 1100  BP: 122/67 117/64 115/66 124/67  Pulse: 96 90 92 98  Resp: (!) 22 (!) 25 (!)  23 (!) 23  Temp: 99.3 F (37.4 C)     TempSrc: Oral     SpO2: 98% 97% 97% 97%  Weight:      Height:       Body mass index is 22.93 kg/m.  Physical Exam  Constitutional: He is oriented to person, place, and time.  He is more lethargic today.  He opens his eyes but does not attempt to answer questions.  HENT:  Mouth/Throat: No oropharyngeal exudate.  Poor dentition.  Eyes: Conjunctivae are normal.  Neck: Neck supple.  Cardiovascular: Normal rate and regular rhythm.  No murmur heard. Pulmonary/Chest: Effort normal and breath sounds normal. He has no wheezes. He has no rales.  Abdominal: Soft. He exhibits no distension. There is no tenderness.  Musculoskeletal: He exhibits edema and tenderness.  No change in large left knee effusion.  Synovial fluid Gram stain shows gram-positive cocci in pairs.  Neurological: He is alert and oriented to person, place, and time.  Skin: No rash noted.  Psychiatric: Mood and affect normal.    Lab Results Lab Results  Component Value Date   WBC 11.8 (H) 06/10/2017   HGB 10.0 (L) 06/10/2017   HCT 29.6 (L) 06/10/2017   MCV 81.3 06/10/2017   PLT 55 (L) 06/10/2017    Lab Results  Component Value Date   CREATININE 1.89 (H) 06/10/2017   BUN 58 (H) 06/10/2017  NA 130 (L) 06/10/2017   K 3.1 (L) 06/10/2017   CL 101 06/10/2017   CO2 20 (L) 06/10/2017    Lab Results  Component Value Date   ALT 101 (H) 06/09/2017   AST 204 (H) 06/09/2017   ALKPHOS 92 06/09/2017   BILITOT 1.1 06/09/2017     Microbiology: Recent Results (from the past 240 hour(s))  Culture, blood (routine x 2)     Status: Abnormal   Collection Time: 06/08/17  5:30 PM  Result Value Ref Range Status   Specimen Description BLOOD LEFT ARM  Final   Special Requests IN PEDIATRIC BOTTLE Blood Culture adequate volume  Final   Culture  Setup Time   Final    GRAM POSITIVE COCCI IN CLUSTERS IN PEDIATRIC BOTTLE CRITICAL VALUE NOTED.  VALUE IS CONSISTENT WITH PREVIOUSLY REPORTED AND  CALLED VALUE.    Culture (A)  Final    STAPHYLOCOCCUS AUREUS SUSCEPTIBILITIES PERFORMED ON PREVIOUS CULTURE WITHIN THE LAST 5 DAYS.    Report Status 06/11/2017 FINAL  Final  Culture, blood (routine x 2)     Status: Abnormal   Collection Time: 06/08/17  5:46 PM  Result Value Ref Range Status   Specimen Description BLOOD RIGHT ANTECUBITAL  Final   Special Requests   Final    BOTTLES DRAWN AEROBIC AND ANAEROBIC Blood Culture adequate volume   Culture  Setup Time   Final    IN BOTH AEROBIC AND ANAEROBIC BOTTLES GRAM POSITIVE COCCI IN CLUSTERS CRITICAL RESULT CALLED TO, READ BACK BY AND VERIFIED WITH: J.LEDFORD,PHARMD [id number] 06/09/17 M.[employer]    Culture STAPHYLOCOCCUS AUREUS (A)  Final   Report Status 06/11/2017 FINAL  Final   Organism ID, Bacteria STAPHYLOCOCCUS AUREUS  Final      Susceptibility   Staphylococcus aureus - MIC*    CIPROFLOXACIN <=0.5 SENSITIVE Sensitive     ERYTHROMYCIN <=0.25 SENSITIVE Sensitive     GENTAMICIN <=0.5 SENSITIVE Sensitive     OXACILLIN <=0.25 SENSITIVE Sensitive     TETRACYCLINE <=1 SENSITIVE Sensitive     VANCOMYCIN <=0.5 SENSITIVE Sensitive     TRIMETH/SULFA <=10 SENSITIVE Sensitive     CLINDAMYCIN <=0.25 SENSITIVE Sensitive     RIFAMPIN <=0.5 SENSITIVE Sensitive     Inducible Clindamycin NEGATIVE Sensitive     * STAPHYLOCOCCUS AUREUS  Blood Culture ID Panel (Reflexed)     Status: Abnormal   Collection Time: 06/08/17  5:46 PM  Result Value Ref Range Status   Enterococcus species NOT DETECTED NOT DETECTED Final   Listeria monocytogenes NOT DETECTED NOT DETECTED Final   Staphylococcus species DETECTED (A) NOT DETECTED Final    Comment: CRITICAL RESULT CALLED TO, READ BACK BY AND VERIFIED WITH: J.LEDFORD,PHARMD [id number] 06/09/17 M.Rachael Ferrie    Staphylococcus aureus DETECTED (A) NOT DETECTED Final    Comment: Methicillin (oxacillin) susceptible Staphylococcus aureus (MSSA). Preferred therapy is anti staphylococcal beta lactam antibiotic (Cefazolin or  Nafcillin), unless clinically contraindicated. CRITICAL RESULT CALLED TO, READ BACK BY AND VERIFIED WITH: J.LEDFORD,PHARMD [id number] 06/09/17 M.Erlinda Solinger    Methicillin resistance NOT DETECTED NOT DETECTED Final   Streptococcus species NOT DETECTED NOT DETECTED Final   Streptococcus agalactiae NOT DETECTED NOT DETECTED Final   Streptococcus pneumoniae NOT DETECTED NOT DETECTED Final   Streptococcus pyogenes NOT DETECTED NOT DETECTED Final   Acinetobacter baumannii NOT DETECTED NOT DETECTED Final   Enterobacteriaceae species NOT DETECTED NOT DETECTED Final   Enterobacter cloacae complex NOT DETECTED NOT DETECTED Final   Escherichia coli NOT DETECTED NOT DETECTED Final   Klebsiella oxytoca NOT  DETECTED NOT DETECTED Final   Klebsiella pneumoniae NOT DETECTED NOT DETECTED Final   Proteus species NOT DETECTED NOT DETECTED Final   Serratia marcescens NOT DETECTED NOT DETECTED Final   Haemophilus influenzae NOT DETECTED NOT DETECTED Final   Neisseria meningitidis NOT DETECTED NOT DETECTED Final   Pseudomonas aeruginosa NOT DETECTED NOT DETECTED Final   Candida albicans NOT DETECTED NOT DETECTED Final   Candida glabrata NOT DETECTED NOT DETECTED Final   Candida krusei NOT DETECTED NOT DETECTED Final   Candida parapsilosis NOT DETECTED NOT DETECTED Final   Candida tropicalis NOT DETECTED NOT DETECTED Final  Urine culture     Status: Abnormal   Collection Time: 06/08/17  7:46 PM  Result Value Ref Range Status   Specimen Description URINE, CLEAN CATCH  Final   Special Requests Normal  Final   Culture >=100,000 COLONIES/mL STAPHYLOCOCCUS AUREUS (A)  Final   Report Status 06/11/2017 FINAL  Final   Organism ID, Bacteria STAPHYLOCOCCUS AUREUS (A)  Final      Susceptibility   Staphylococcus aureus - MIC*    CIPROFLOXACIN <=0.5 SENSITIVE Sensitive     GENTAMICIN <=0.5 SENSITIVE Sensitive     NITROFURANTOIN 32 SENSITIVE Sensitive     OXACILLIN <=0.25 SENSITIVE Sensitive     TETRACYCLINE <=1 SENSITIVE  Sensitive     VANCOMYCIN 1 SENSITIVE Sensitive     TRIMETH/SULFA <=10 SENSITIVE Sensitive     CLINDAMYCIN <=0.25 SENSITIVE Sensitive     RIFAMPIN <=0.5 SENSITIVE Sensitive     Inducible Clindamycin NEGATIVE Sensitive     * >=100,000 COLONIES/mL STAPHYLOCOCCUS AUREUS  Respiratory Panel by PCR     Status: None   Collection Time: 06/08/17  7:46 PM  Result Value Ref Range Status   Adenovirus NOT DETECTED NOT DETECTED Final   Coronavirus 229E NOT DETECTED NOT DETECTED Final   Coronavirus HKU1 NOT DETECTED NOT DETECTED Final   Coronavirus NL63 NOT DETECTED NOT DETECTED Final   Coronavirus OC43 NOT DETECTED NOT DETECTED Final   Metapneumovirus NOT DETECTED NOT DETECTED Final   Rhinovirus / Enterovirus NOT DETECTED NOT DETECTED Final   Influenza A NOT DETECTED NOT DETECTED Final   Influenza B NOT DETECTED NOT DETECTED Final   Parainfluenza Virus 1 NOT DETECTED NOT DETECTED Final   Parainfluenza Virus 2 NOT DETECTED NOT DETECTED Final   Parainfluenza Virus 3 NOT DETECTED NOT DETECTED Final   Parainfluenza Virus 4 NOT DETECTED NOT DETECTED Final   Respiratory Syncytial Virus NOT DETECTED NOT DETECTED Final   Bordetella pertussis NOT DETECTED NOT DETECTED Final   Chlamydophila pneumoniae NOT DETECTED NOT DETECTED Final   Mycoplasma pneumoniae NOT DETECTED NOT DETECTED Final  CSF culture     Status: None (Preliminary result)   Collection Time: 06/08/17 11:00 PM  Result Value Ref Range Status   Specimen Description CSF  Final   Special Requests Normal  Final   Culture NO GROWTH 3 DAYS  Final   Report Status PENDING  Incomplete  Gram stain     Status: None   Collection Time: 06/08/17 11:00 PM  Result Value Ref Range Status   Specimen Description CSF  Final   Special Requests Normal  Final   Gram Stain   Final    CYTOSPIN SLIDE WBC PRESENT,BOTH PMN AND MONONUCLEAR NO ORGANISMS SEEN    Report Status 06/08/2017 FINAL  Final  Culture, blood (single)     Status: None (Preliminary result)    Collection Time: 06/08/17 11:30 PM  Result Value Ref Range Status  Specimen Description BLOOD LEFT FOREARM  Final   Special Requests   Final    BOTTLES DRAWN AEROBIC AND ANAEROBIC Blood Culture adequate volume   Culture NO GROWTH 1 DAY  Final   Report Status PENDING  Incomplete  MRSA PCR Screening     Status: None   Collection Time: 06/09/17  3:15 AM  Result Value Ref Range Status   MRSA by PCR NEGATIVE NEGATIVE Final    Comment:        The GeneXpert MRSA Assay (FDA approved for NASAL specimens only), is one component of a comprehensive MRSA colonization surveillance program. It is not intended to diagnose MRSA infection nor to guide or monitor treatment for MRSA infections.   Body fluid culture     Status: None (Preliminary result)   Collection Time: 06/10/17  1:13 PM  Result Value Ref Range Status   Specimen Description SYNOVIAL LEFT KNEE  Final   Special Requests Normal  Final   Gram Stain   Final    FEW WBC PRESENT, PREDOMINANTLY PMN RARE GRAM POSITIVE COCCI IN PAIRS    Culture FEW UNIDENTIFIED ORGANISM  Final   Report Status PENDING  Incomplete    Cliffton Asters, MD [facility] [phone] pager   [phone] cell 06/11/2017, 11:38 AM

## 2017-06-11 NOTE — Anesthesia Postprocedure Evaluation (Addendum)
Anesthesia Post Note  Patient: Marcus [name]  Procedure(s) Performed: IRRIGATION AND DEBRIDEMENT KNEE (Left )     Patient location during evaluation: PACU Anesthesia Type: General Level of consciousness: sedated Pain management: pain level controlled Vital Signs Assessment: post-procedure vital signs reviewed and stable Respiratory status: spontaneous breathing and respiratory function stable Cardiovascular status: stable Postop Assessment: no apparent nausea or vomiting Anesthetic complications: no    Last Vitals:  Vitals:   06/11/17 1625 06/11/17 1629  BP: (!) 146/114 140/82  Pulse: 98 97  Resp: (!) 35 (!) 26  Temp:  (!) 36.1 C  SpO2: 98% 99%                 Florenda Watt DANIEL

## 2017-06-11 NOTE — Op Note (Signed)
   Date of Surgery: 06/11/2017  INDICATIONS: [name] is a 52 y.o.-year-old male with a left knee septic arthritis;  The patient did consent to the procedure after discussion of the risks and benefits.  PREOPERATIVE DIAGNOSIS: Septic arthritis of left knee  POSTOPERATIVE DIAGNOSIS: Same.  PROCEDURE: Arthrotomy of left knee and irrigation and debridement for septic arthritis  SURGEON: N. Glee ArvinMichael Fairy Ashlock, M.D.  ASSIST: Starlyn SkeansMary Lindsey BuhlerStanbery, [place]PA-C; necessary for the timely completion of procedure and due to complexity of procedure.  ANESTHESIA:  general  IV FLUIDS AND URINE: See anesthesia.  ESTIMATED BLOOD LOSS: minimal mL.  IMPLANTS: none  DRAINS: HVAC  COMPLICATIONS: None.  DESCRIPTION OF PROCEDURE: The patient was brought to the operating room and placed supine on the operating table.  The patient had been signed prior to the procedure and this was documented. The patient had the anesthesia placed by the anesthesiologist.  A time-out was performed to confirm that this was the correct patient, site, side and location. The patient did receive antibiotics prior to the incision and was re-dosed during the procedure as needed at indicated intervals.  The patient had the operative extremity prepped and draped in the standard surgical fashion.    A 4 cm incision on the medial aspect of the patella was created.  Dissection was carried down to the medial retinaculum.  The retinaculum was then sharply incised in line with the incision.  There was frank pus that was expressed from the knee joint.  This was cultured.  I then performed synovectomy of the knee joint with a rongeur.  After thorough debridement the knee joint was then irrigated with 6 L of normal saline.  A gram of vancomycin powder was placed in the joint.  A medium Hemovac was also placed in the joint and brought out the superior lateral aspect of the knee.  The arthrotomy was closed with interrupted #1 PDS.  Subcutaneous layer was closed  with 2-0 Monocryl.  Skin was closed with 2-0 nylon.  Sterile dressings were applied.  Patient tolerated procedure well had no immediate complications.  POSTOPERATIVE PLAN: Patient can be weight-bear as tolerated to the left lower extremity.  We will follow his clinical exam for any worsening.  We will discontinue the Hemovac drain once the drainage has tailed off.  Mayra ReelN. Michael Rosi Secrist, MD [facility] [phone] 3:44 PM

## 2017-06-11 NOTE — Anesthesia Procedure Notes (Signed)
Procedure Name: LMA Insertion Date/Time: 06/11/2017 2:59 PM Performed by: Rachel MouldsLee, Olander Friedl B, CRNA Pre-anesthesia Checklist: Patient identified, Emergency Drugs available, Suction available, Patient being monitored and Timeout performed Patient Re-evaluated:Patient Re-evaluated prior to induction Oxygen Delivery Method: Circle system utilized Preoxygenation: Pre-oxygenation with 100% oxygen Induction Type: IV induction LMA: LMA inserted LMA Size: 4.0 Number of attempts: 1 Placement Confirmation: positive ETCO2,  breath sounds checked- equal and bilateral and CO2 detector Tube secured with: Tape Dental Injury: Teeth and Oropharynx as per pre-operative assessment

## 2017-06-11 NOTE — Progress Notes (Signed)
PULMONARY / CRITICAL CARE MEDICINE   Name: Marcus [name] MRN: [id number] DOB: 1966-01-26    ADMISSION DATE:  06/08/2017  CHIEF COMPLAINT: Altered mental status  HISTORY OF PRESENT ILLNESS:        This is a 52 year old who was found down.  He had CT is shown multiple defects in the right hemisphere and right cerebellum.  In addition he has a warm effusion of the right knee.  Blood cultures obtained last night are already growing staph aureus.  Was able to speak with family this morning, they deny a known history of IV drug abuse.  Toxicology screen on presentation was positive for cocaine opiates and benzodiazepines.  This morning he is extremely lethargic.  He will awaken and produce 1 or 2 sentences but only after noxious stimulation.  PAST MEDICAL HISTORY :  He  has no past medical history on file.  PAST SURGICAL HISTORY: He  has a past surgical history that includes Hip surgery; Ankle surgery; and Hip fracture surgery (Left, 1994).  No Known Allergies   Current Facility-Administered Medications:  .  0.9 %  sodium chloride infusion, 250 mL, Intravenous, PRN, Janyth Contes, Katalina M, NP .  0.9 %  sodium chloride infusion, , Intravenous, Continuous, Eubanks, Katalina M, NP, Last Rate: 125 mL/hr at 06/11/17 1200 .  acetaminophen (TYLENOL) tablet 650 mg, 650 mg, Oral, Q6H PRN, Marcelle Smiling, MD, 650 mg at 06/10/17 1738 .  bupivacaine (MARCAINE) 0.5 % injection 10 mL, 10 mL, Infiltration, Once, Freeman Caldron, PA-C .  lidocaine (XYLOCAINE) 1 % (with pres) injection 5 mL, 5 mL, Other, Once, Freeman Caldron, PA-C .  morphine 4 MG/ML injection 2 mg, 2 mg, Intravenous, Q4H PRN, Marcelle Smiling, MD, 2 mg at 06/11/17 1154 .  nafcillin 2 g in dextrose 5 % 100 mL IVPB, 2 g, Intravenous, Q4H, Cliffton Asters, MD, Stopped at 06/11/17 0935 .  pantoprazole (PROTONIX) injection 40 mg, 40 mg, Intravenous, Q24H, Lynnell Jude, MD, 40 mg at 06/11/17 1156 .  thiamine (B-1) injection 100 mg, 100 mg,  Intravenous, Daily, Oretha Milch, MD, 100 mg at 06/11/17 0910  REVIEW OF SYSTEMS:   Unobtainable  SUBJECTIVE: Had L knee arthrocentesis yesterday. Results compatible with septic joint. Scheduled for trip to OR today.   VITAL SIGNS: BP 121/64   Pulse 95   Temp 98.7 F (37.1 C) (Oral)   Resp (!) 26   Ht 6' (1.829 m)   Wt 76.7 kg (169 lb 1.5 oz)   SpO2 99%   BMI 22.93 kg/m   HEMODYNAMICS:    VENTILATOR SETTINGS:    INTAKE / OUTPUT: I/O last 3 completed shifts: In: 5400 [P.O.:600; I.V.:4500; IV Piggyback:300] Out: 4440 [Urine:4440]  PHYSICAL EXAMINATION: General: awake, alert, oriented to person and place. Much more interactive and intelligible compared to yesterday. Neuro: Pupils are equal at 2-3 mm and reactive, the face is symmetric.  He does follow some instructions, appears to be weak in the left upper and lower extremity.   HEENT: Teeth are in very poor repair. Multiple missing and fractured upper teeth. Cardiovascular: S1 and S2 are regular after prolonged auscultation. NO MURMUR. No rub. No gallop. Lungs: Respirations are unlabored, there is symmetric air movement, no wheezes Abdomen: soft and flat without organomegaly masses tenderness guarding or rebound.  He is anicteric Musculoskeletal: large ballotable warm effusion of the left knee Skin: I do not appreciate any tracks  LABS:  BMET Recent Labs  Lab 06/09/17 0719 06/10/17 0451 06/10/17 1249  NA 131* 129* 130*  K 3.6 2.7* 3.1*  CL 98* 100* 101  CO2 20* 17* 20*  BUN 82* 68* 58*  CREATININE 2.65* 2.01* 1.89*  GLUCOSE 131* 117* 108*    Electrolytes Recent Labs  Lab 06/09/17 0719 06/10/17 0451 06/10/17 1249  CALCIUM 6.7* 6.3* 6.8*  MG 2.4 2.2  --   PHOS 6.5* 4.1  --     CBC Recent Labs  Lab 06/08/17 1746 06/08/17 1757 06/09/17 0719 06/10/17 0451  WBC 33.5*  --  20.6* 11.8*  HGB 16.4 17.7* 11.5* 10.0*  HCT 46.9 52.0 33.2* 29.6*  PLT 134*  --  75* 55*    Coag's Recent Labs  Lab  06/08/17 1746  INR 1.49    Sepsis Markers Recent Labs  Lab 06/08/17 1952 06/08/17 2338  06/09/17 0719 06/10/17 0451 06/11/17 0335  LATICACIDVEN 7.09* 1.8  --  1.6  --   --   PROCALCITON  --   --    < > 69.89 43.77 26.60   < > = values in this interval not displayed.    ABG Recent Labs  Lab 06/09/17 0046  PHART 7.500*  PCO2ART 25.5*  PO2ART 138.0*    Liver Enzymes Recent Labs  Lab 06/08/17 1746 06/08/17 2337 06/09/17 0719  AST 187* 205* 204*  ALT 107* 98* 101*  ALKPHOS 190* 105 92  BILITOT 2.2* 1.4* 1.1  ALBUMIN 2.3* 1.6* 1.4*    Cardiac Enzymes Recent Labs  Lab 06/09/17 0719 06/09/17 1224 06/09/17 1832  TROPONINI 0.29* 0.22* 0.24*    Glucose Recent Labs  Lab 06/10/17 1537 06/10/17 1954 06/10/17 2328 06/11/17 0343 06/11/17 0804 06/11/17 1132  GLUCAP 113* 126* 95 72 104* 90    Imaging No results found.  ECHO (06/09/2017) - Left ventricle: The cavity size was normal; mild focal basal hypertrophy of the septum. Systolic function was normal. LVEF 60%-65%. Wall motion normal. Left ventricular diastolic function parameters were normal. - Mitral valve: There is a shaggy rounded mobile density on the anterior mitral valve leaflet measuring 1.31 x 1.56 cm consistent with vegetation.  Structurally normal valve.   Mobility was not restricted.  Doppler:  Transvalvular velocity was within the normal range. There was no evidence for stenosis. There was mild regurgitation.    Peak gradient (D): 3 mm Hg.  CULTURES: Blood culture obtained last night is growing staph aureus  ANTIBIOTICS: Rocephin and nafcillin  DISCUSSION: This is a 52 year old who was found unresponsive who has multiple defects in the right hemisphere and right cerebellum and a warm ballotable effusion in the left knee.  He is growing staph aureus from the blood and almost certainly this represents endocarditis.  ASSESSMENT / PLAN:  PULMONARY A: No active issue, there are no overt septic  emboli on the chest x-ray  RENAL ACUTE KIDNEY INJURY, improving. Cr downtrending. HYPONATREMIA HYPOKALEMIA HYPOCALCEMIA Suspected to be secondary to endocarditis.  Ensuring adequate hydration. Check serum and urine osmolality.  GASTROINTESTINAL A: GI prophylaxis/Protonix P: Advance diet as tolerated.  INFECTIOUS A: ENDOCARDITIS/S aureus with mitral valve vegetation demonstrated on echocardiographic interrogation P: Continue nafcillin. Repeat blood cultures. Will not pursue cardiothoracic surgery consultation at this time in the absence of clinical symptoms of overt heart failure. However, this patient will need orthopedic surgical consultation.  NEUROLOGIC A: Multiple emboli in different vascular territories with staph bacteremia almost certainly representing endocarditis.  Appropriate therapy is in place  MUSCULOSKELETAL A: L knee effusion, likely septic joint P: anticipating trip to OR today. Orthopedic Surgery help  appreciated.   Marcelle Smiling, MD Pulmonary and Critical Care Medicine [facility] Pager: [phone]  06/11/2017, 1:03 PM

## 2017-06-11 NOTE — Progress Notes (Signed)
Patient is being transferred to [facility]. Report called to the receiving nurse.

## 2017-06-11 NOTE — Care Management (Signed)
This is a no charge note  Pick up from PCCM per NP, Nehemiah SettleBrooke  52 year old male with history of polysubstance abuse, [facility] secondary to Staphylococcus aureus bacteremia secondary to mitral valve endocarditis, left knee septic joint and brain septic emboli. Pt is s/p of left knee irrigation by orthopedic surgeon. Patient is currently on nafcillin per ID recommendation.   WBC 11.8, lactic acid 1.6, troponin 0.24, temperature normal, tachycardia, tachypnea, oxygen saturation 96%, Bp 110/65.   Lorretta HarpXilin Edona Schreffler, MD  [facility] Pager [phone]  If 7PM-7AM, please contact night-coverage [email or web address] Password [email or web address] 06/11/2017, 7:40 PM

## 2017-06-11 NOTE — Progress Notes (Signed)
eLink Physician-Brief Progress Note Patient Name: Kern ReapBryon T Benoist DOB: 10/09/1965 MRN: [id number]   Date of Service  06/11/2017  HPI/Events of Note  Nurse sees blood in stool  eICU Interventions  Nurse asking for hemoccult order     Intervention Category Evaluation Type: Other  Erin FullingKurian Andreas Sobolewski 06/11/2017, 9:41 PM

## 2017-06-12 ENCOUNTER — Encounter (HOSPITAL_COMMUNITY): Payer: Self-pay | Admitting: Orthopaedic Surgery

## 2017-06-12 DIAGNOSIS — R4182 Altered mental status, unspecified: Secondary | ICD-10-CM

## 2017-06-12 LAB — GLUCOSE, CAPILLARY
GLUCOSE-CAPILLARY: 132 mg/dL — AB (ref 65–99)
GLUCOSE-CAPILLARY: 135 mg/dL — AB (ref 65–99)
GLUCOSE-CAPILLARY: 141 mg/dL — AB (ref 65–99)
GLUCOSE-CAPILLARY: 189 mg/dL — AB (ref 65–99)
Glucose-Capillary: 151 mg/dL — ABNORMAL HIGH (ref 65–99)
Glucose-Capillary: 104 mg/dL — ABNORMAL HIGH (ref 65–99)

## 2017-06-12 LAB — CSF CULTURE W GRAM STAIN

## 2017-06-12 LAB — PATHOLOGIST SMEAR REVIEW

## 2017-06-12 LAB — CSF CULTURE
CULTURE: NO GROWTH
SPECIAL REQUESTS: NORMAL

## 2017-06-12 MED ORDER — PANTOPRAZOLE SODIUM 40 MG PO TBEC
40.0000 mg | DELAYED_RELEASE_TABLET | Freq: Every day | ORAL | Status: DC
  Administered 2017-06-12 – 2017-06-22 (×11): 40 mg via ORAL
  Filled 2017-06-12 (×11): qty 1

## 2017-06-12 MED ORDER — VITAMIN B-1 100 MG PO TABS
100.0000 mg | ORAL_TABLET | Freq: Every day | ORAL | Status: DC
  Administered 2017-06-12 – 2017-06-23 (×12): 100 mg via ORAL
  Filled 2017-06-12 (×12): qty 1

## 2017-06-12 NOTE — Clinical Social Work Note (Signed)
Clinical Social Work Assessment  Patient Details  Name: Marcus [name] MRN: [id number] Date of Birth: 02/21/1966  Date of referral:  06/12/17               Reason for consult:  Facility Placement, Substance Use/ETOH Abuse, Financial Concerns                Permission sought to share information with:  Family Supports Permission granted to share information::  No(pt disoriented spoke with next of kin pt mom Larita FifeLynn and son Sheria LangCameron)  Name::     Programmer, systemsLynn  Agency::  SNF  Relationship::  mom  Contact Information:     Housing/Transportation Living arrangements for the past 2 months:  Mobile Home(pays $450/month in rent) Source of Information:  Parent Patient Interpreter Needed:  None Criminal Activity/Legal Involvement Pertinent to Current Situation/Hospitalization:  No - Comment as needed Significant Relationships:  Adult Children, Parents Lives with:  Self Do you feel safe going back to the place where you live?  No Need for family participation in patient care:  No (Coment)  Care giving concerns:  Pt lives in mobile home and is normally independent with all mobility.  Patient has large family support system but pt is not appropriate to return home due to need for long term antibiotics and drug use.   Social Worker assessment / plan:  CSW informed by RN that family wanting to speak with CSW regarding paying for pt trailer while he needs to remain inpatient for treatment.  State that pt rents a trailer and that family does not have funds to pay for next couple of months while pt recovers and awaits disability funding to come through (working on [employer] and disability with [facility] financial counsel[facility]).    Pt mom reports that pt does not have a savings account and that he lives job to job to pay for housing.  CSW discussed getting POA of patient but that this would not be helpful since pt has no bank account for them to access funds and fact that pt may regain orientation.  CSW provided pt with  list of Crisis Resources for them to call and apply for emergency assistance with rent while pt awaits disability determination.  CSW also discussed possible need for SNF placement for antibiotics.  Explained that because patient does not have insurance placement could be far away- Pt mom expressed understanding but is concerned about pt being out of county for treatment.  Employment status:  Full-Time(self-employed) Health and safety inspectornsurance information:  Other (Comment Required) PT Recommendations:  Not assessed at this time Information / Referral to community resources:  Skilled Nursing Facility  Patient/Family's Response to care:  Pt family very involved and hopeful that pt will get placement where he can improve with PT and IV antibiotic treatment.  Not happy about limited options for SNF but understand that without insurance options will likely not be local.  Patient/Family's Understanding of and Emotional Response to Diagnosis, Current Treatment, and Prognosis:  Pt family expressed understanding of pt condition- optimistic about his recovery potential.  Emotional Assessment Appearance:  Appears stated age Attitude/Demeanor/Rapport:    Affect (typically observed):  Unable to Assess Orientation:  Oriented to Self Alcohol / Substance use:  Not Applicable Psych involvement (Current and /or in the community):  No (Comment)  Discharge Needs  Concerns to be addressed:  Care Coordination Readmission within the last 30 days:  No Current discharge risk:  Physical Impairment Barriers to Discharge:  Continued Medical Work up  Burna Sis, LCSW 06/12/2017, 1:30 PM

## 2017-06-12 NOTE — Progress Notes (Signed)
CSW follow for SNF placement when stable for DC.  CSW spoke with Infectious Disease MD who states that nafcillin is the best option for this pt and would not be safe to change for at least 4 wks.  Nafcillin is expensive and due to lack of insurance we are unable to place [facility]  Will need to remain in hospital or get DTP bed (which had long waitlist at this time) to completed needed amount of nafcillin.  CSW can attempt to place elsewhere when pt is safe to switch to less expensive antibiotic  CSW will continue to follow  Burna SisJenna H. Torre [place], LCSW Clinical Social Worker [phone]

## 2017-06-12 NOTE — NC FL2 (Signed)
[place] MEDICAID FL2 LEVEL OF CARE SCREENING TOOL     IDENTIFICATION  Patient Name: DWYANE DUPREE Birthdate: 11-05-1965 Sex: male Admission Date (Current Location): 06/08/2017  [facility] and [place] Number:  Producer, television/film/video and Address:  [facility], [address], [place], [place] [address]      Provider Number: [id number]  Attending Physician Name and Address:  Zannie Cove, MD  Relative Name and Phone Number:       Current Level of Care: Hospital Recommended Level of Care: Skilled Nursing Facility Prior Approval Number:    Date Approved/Denied:   PASRR Number: [id number]  Discharge Plan: SNF    Current Diagnoses: Patient Active Problem List   Diagnosis Date Noted  . Acute bacterial endocarditis   . Altered mental status   . Hyperkalemia   . Hepatitis C antibody test positive 06/10/2017  . Encephalopathy 06/09/2017  . Bacteremia due to methicillin susceptible Staphylococcus aureus (MSSA) 06/09/2017  . Elevated liver enzymes 06/09/2017  . Normocytic anemia 06/09/2017  . Thrombocytopenia (HCC) 06/09/2017  . Polysubstance abuse (HCC) 06/09/2017  . Endocarditis of mitral valve 06/09/2017  . Septic arthritis of knee, left (HCC) 06/09/2017  . Poor dentition 06/09/2017  . Meningitis due to bacteria   . Septic embolism (HCC)   . Sepsis (HCC)   . AKI (acute kidney injury) (HCC)     Orientation RESPIRATION BLADDER Height & Weight     Self  Normal Incontinent, External catheter Weight: 171 lb 1.2 oz (77.6 kg) Height:  6' (182.9 cm)  BEHAVIORAL SYMPTOMS/MOOD NEUROLOGICAL BOWEL NUTRITION STATUS      Incontinent Diet(cardiac)  AMBULATORY STATUS COMMUNICATION OF NEEDS Skin   Extensive Assist Verbally PU Stage and Appropriate Care(on sacrum foam dressing changes PRN)                       Personal Care Assistance Level of Assistance  Bathing, Dressing Bathing Assistance: Maximum assistance   Dressing Assistance: Maximum  assistance     Functional Limitations Info             SPECIAL CARE FACTORS FREQUENCY  PT (By licensed PT), OT (By licensed OT)     PT Frequency: 5/wk OT Frequency: 5/wk            Contractures      Additional Factors Info  Code Status, Allergies Code Status Info: FULL Allergies Info: NKA           Current Medications (06/12/2017):  This is the current hospital active medication list Current Facility-Administered Medications  Medication Dose Route Frequency Provider Last Rate Last Dose  . 0.9 %  sodium chloride infusion  250 mL Intravenous PRN Tobey Grim, NP      . 0.9 %  sodium chloride infusion   Intravenous Continuous Zannie Cove, MD 75 mL/hr at 06/12/17 0818    . acetaminophen (TYLENOL) tablet 650 mg  650 mg Oral Q6H PRN Marcelle Smiling, MD   650 mg at 06/12/17 0428  . bupivacaine (MARCAINE) 0.5 % injection 10 mL  10 mL Infiltration Once Freeman Caldron, PA-C      . lidocaine (XYLOCAINE) 1 % (with pres) injection 5 mL  5 mL Other Once Freeman Caldron, PA-C      . morphine 4 MG/ML injection 2 mg  2 mg Intravenous Q4H PRN Marcelle Smiling, MD   2 mg at 06/12/17 1408  . nafcillin 2 g in dextrose 5 % 100 mL  IVPB  2 g Intravenous Q4H Cliffton Astersampbell, John, MD   Stopped at 06/12/17 1313  . pantoprazole (PROTONIX) EC tablet 40 mg  40 mg Oral Q1200 Zannie CoveJoseph, Preetha, MD   40 mg at 06/12/17 1203  . thiamine (VITAMIN B-1) tablet 100 mg  100 mg Oral Daily Zannie CoveJoseph, Preetha, MD   100 mg at 06/12/17 1207     Discharge Medications: Please see discharge summary for a list of discharge medications.  Relevant Imaging Results:  Relevant Lab Results:   Additional Information SS#: [id number]; will need IV naficillin q 4 hours for 6 weeks start date 06/09/17  Burna SisUris, Cloud Graham H, LCSW

## 2017-06-12 NOTE — Progress Notes (Signed)
Marcus [name] [id number]  Code Status: FULL  Admission Data: 06/12/2017 7:31 PM  Attending Provider: Jomarie LongsJoseph  WUJ:[id number]:Patient, No Pcp Per  Consults/ Treatment Team: Treatment Team:  Marcelle SmilingJeong, Seong-Joo, MD Freeman CaldronJeffery, Michael J, PA-C  Marcus [name] is a 52 y.o. male patient admitted from ED awake, alert - oriented X 3 - no acute distress noted. VSS - Blood pressure (!) 119/57, pulse (!) 105, temperature 99.8 F (37.7 C), temperature source Oral, resp. rate (!) 21, height 5\' 11"  (1.803 m), weight 81.5 kg (179 lb 9.6 oz), SpO2 99 %. no c/o shortness of breath, no c/o chest pain. Cardiac tele # 15, in place.  IV Fluids: IV in place, occlusive dsg intact without redness. Allergies: No Known Allergies    History reviewed. No pertinent past medical history.  Medications Prior to Admission  Medication Sig Dispense Refill  . acetaminophen (TYLENOL) 500 MG tablet Take 500-1,000 mg by mouth every 6 (six) hours as needed (for pain or headaches).    Marland Kitchen. ibuprofen (ADVIL,MOTRIN) 200 MG tablet Take 200-600 mg by mouth every 6 (six) hours as needed (for pain or headaches).     Marland Kitchen. amoxicillin-clavulanate (AUGMENTIN) 875-125 MG per tablet Take 1 tablet by mouth 2 (two) times daily. (Patient not taking: Reported on 06/08/2017) 23 tablet 0  . diazepam (VALIUM) 5 MG tablet Take 1 tablet (5 mg total) by mouth every 6 (six) hours as needed for muscle spasms. (Patient not taking: Reported on 06/08/2017) 10 tablet 0  . oxyCODONE-acetaminophen (PERCOCET/ROXICET) 5-325 MG tablet Take 1 tablet by mouth every 4 (four) hours as needed for severe pain (pain). (Patient not taking: Reported on 06/08/2017) 12 tablet 0    Orientation to room, and floor completed with information packet given to patient/family. Patient declined safety video at this time. Admission INP armband ID verified with patient, and in place. Patient in pain and calling out, IV morphine given. SR up x 2, fall assessment complete and verbalized understanding to call nsg before up  out of bed. Call light within reach, patient able to voice, and demonstrate understanding. Hemovac in place to left leg.  Foam dressing on bottom for redness.  ?  Will cont to eval and treat per MD orders.  Jon GillsElisa R Nozomi Mettler, RN  06/12/2017 7:31 PM

## 2017-06-12 NOTE — Progress Notes (Addendum)
Subjective: 1 Day Post-Op Procedure(s) (LRB): IRRIGATION AND DEBRIDEMENT KNEE (Left) Patient reports pain as moderate.  Patient resting comfortably.  Nurse and patient's son stating patient doing well, but still c/o moderate left knee pain.  Objective: Vital signs in last 24 hours: Temp:  [97 F (36.1 C)-100.4 F (38 C)] 100.4 F (38 C) (01/10 0427) Pulse Rate:  [90-121] 114 (01/10 0427) Resp:  [19-35] 32 (01/10 0427) BP: (109-146)/(64-114) 116/68 (01/10 0427) SpO2:  [95 %-100 %] 95 % (01/10 0427) Weight:  [169 lb 1.5 oz (76.7 kg)-171 lb 1.2 oz (77.6 kg)] 171 lb 1.2 oz (77.6 kg) (01/10 0427)  Intake/Output from previous day: 01/09 0701 - 01/10 0700 In: 2958.5 [I.V.:2758.5; IV Piggyback:200] Out: 1450 [Urine:1400; Blood:50] Intake/Output this shift: No intake/output data recorded.  Recent Labs    06/10/17 0451  HGB 10.0*   Recent Labs    06/10/17 0451  WBC 11.8*  RBC 3.64*  HCT 29.6*  PLT 55*   Recent Labs    06/10/17 0451 06/10/17 1249  NA 129* 130*  K 2.7* 3.1*  CL 100* 101  CO2 17* 20*  BUN 68* 58*  CREATININE 2.01* 1.89*  GLUCOSE 117* 108*  CALCIUM 6.3* 6.8*   No results for input(s): LABPT, INR in the last 72 hours.  Neurologically intact Neurovascular intact Sensation intact distally Intact pulses distally Incision: dressing C/D/I Compartment soft  hemovac drain put out 50mL serosang fluid since yesterday Ace bandage intact   Assessment/Plan: 1 Day Post-Op Procedure(s) (LRB): IRRIGATION AND DEBRIDEMENT KNEE (Left) Up with therapy  WBAT LLE Elevate for swelling Will likely pull hemovac drain this afternoon or tomorrow Continue abx per ID  Cristie HemMary L Stanbery 06/12/2017, 8:01 AM

## 2017-06-12 NOTE — Progress Notes (Signed)
Patient had a large liquid incontinent bm that soiled the dressing on his leg. Spoke to ortho team who advised to clean it with CHG wipes and change the dressing with 4X4's, ABD's, Kerlix and an Ace wrap. Morphine given prior to changing dressing. Patient tolerated poorly.

## 2017-06-12 NOTE — Progress Notes (Signed)
Patient ID: Marcus [name], male   DOB: February 23, 1966, 52 y.o.   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  I tried to explain this to him.  He needs 6 weeks of IV antibiotic therapy.  PLAN: 1. Continue nafcillin  Principal Problem:   Bacteremia due to methicillin susceptible Staphylococcus aureus (MSSA) Active Problems:   Meningitis due to bacteria   Septic embolism (HCC)   Endocarditis of mitral valve   Septic arthritis of knee, left (HCC)   Hepatitis C antibody test positive   Encephalopathy   Sepsis (HCC)   AKI (acute kidney injury) (HCC)   Elevated liver enzymes   Normocytic anemia   Thrombocytopenia (HCC)   Polysubstance abuse (HCC)   Poor dentition   Acute bacterial endocarditis   Altered mental status   Hyperkalemia   Scheduled Meds: . bupivacaine  10 mL Infiltration Once  . lidocaine  5 mL Other Once  . pantoprazole  40 mg Oral Q1200  . thiamine  100 mg Oral Daily   Continuous Infusions: . sodium chloride    . sodium chloride 75 mL/hr at 06/12/17 0818  . nafcillin IV 2 g (06/12/17 1203)   PRN Meds:.sodium chloride, acetaminophen, morphine injection   SUBJECTIVE: He states that he is feeling better today.  He cannot tell me what has made him so sick.  Review of Systems: Review of Systems  Unable to perform ROS: Mental acuity    No Known Allergies  OBJECTIVE: Vitals:   06/11/17 2300 06/12/17 0427 06/12/17 0804 06/12/17 1212  BP: 113/65 116/68 107/66 113/64  Pulse: (!) 121 (!) 114 92   Resp: (!) 29 (!) 32 (!) 26   Temp: 98.9 F (37.2 C) (!) 100.4 F (38 C) 99.2 F (37.3 C) 98.8 F (37.1 C)  TempSrc: Oral Oral Axillary Oral  SpO2: 100% 95% 98%   Weight:  171 lb 1.2 oz (77.6 kg)    Height:       Body mass index is 23.2 kg/m.  Physical Exam    Constitutional: He is oriented to person, place, and time.  He is more alert and interactive today but still answers questions very slowly.  His son is visiting.  HENT:  Mouth/Throat: No oropharyngeal exudate.  Poor dentition.  Cardiovascular: Normal rate and regular rhythm.  No murmur heard. Pulmonary/Chest: Effort normal. He has no wheezes. He has no rales.  Abdominal: Soft. There is no tenderness.  Musculoskeletal:  Left knee less swollen.  Neurological: He is alert and oriented to person, place, and time.    Lab Results Lab Results  Component Value Date   WBC 11.8 (H) 06/10/2017   HGB 10.0 (L) 06/10/2017   HCT 29.6 (L) 06/10/2017   MCV 81.3 06/10/2017   PLT 55 (L) 06/10/2017    Lab Results  Component Value Date   CREATININE 1.89 (H) 06/10/2017   BUN 58 (H) 06/10/2017   NA 130 (L) 06/10/2017   K 3.1 (L) 06/10/2017   CL 101 06/10/2017   CO2 20 (L) 06/10/2017    Lab Results  Component Value Date   ALT 101 (H) 06/09/2017   AST 204 (H) 06/09/2017   ALKPHOS 92 06/09/2017   BILITOT 1.1 06/09/2017     Microbiology: Recent Results (  from the past 240 hour(s))  Culture, blood (routine x 2)     Status: Abnormal   Collection Time: 06/08/17  5:30 PM  Result Value Ref Range Status   Specimen Description BLOOD LEFT ARM  Final   Special Requests IN PEDIATRIC BOTTLE Blood Culture adequate volume  Final   Culture  Setup Time   Final    GRAM POSITIVE COCCI IN CLUSTERS IN PEDIATRIC BOTTLE CRITICAL VALUE NOTED.  VALUE IS CONSISTENT WITH PREVIOUSLY REPORTED AND CALLED VALUE.    Culture (A)  Final    STAPHYLOCOCCUS AUREUS SUSCEPTIBILITIES PERFORMED ON PREVIOUS CULTURE WITHIN THE LAST 5 DAYS.    Report Status 06/11/2017 FINAL  Final  Culture, blood (routine x 2)     Status: Abnormal   Collection Time: 06/08/17  5:46 PM  Result Value Ref Range Status   Specimen Description BLOOD RIGHT ANTECUBITAL  Final   Special Requests   Final    BOTTLES DRAWN AEROBIC AND ANAEROBIC Blood  Culture adequate volume   Culture  Setup Time   Final    IN BOTH AEROBIC AND ANAEROBIC BOTTLES GRAM POSITIVE COCCI IN CLUSTERS CRITICAL RESULT CALLED TO, READ BACK BY AND VERIFIED WITH: J.LEDFORD,PHARMD [id number] 06/09/17 M.Brucha Ahlquist    Culture STAPHYLOCOCCUS AUREUS (A)  Final   Report Status 06/11/2017 FINAL  Final   Organism ID, Bacteria STAPHYLOCOCCUS AUREUS  Final      Susceptibility   Staphylococcus aureus - MIC*    CIPROFLOXACIN <=0.5 SENSITIVE Sensitive     ERYTHROMYCIN <=0.25 SENSITIVE Sensitive     GENTAMICIN <=0.5 SENSITIVE Sensitive     OXACILLIN <=0.25 SENSITIVE Sensitive     TETRACYCLINE <=1 SENSITIVE Sensitive     VANCOMYCIN <=0.5 SENSITIVE Sensitive     TRIMETH/SULFA <=10 SENSITIVE Sensitive     CLINDAMYCIN <=0.25 SENSITIVE Sensitive     RIFAMPIN <=0.5 SENSITIVE Sensitive     Inducible Clindamycin NEGATIVE Sensitive     * STAPHYLOCOCCUS AUREUS  Blood Culture ID Panel (Reflexed)     Status: Abnormal   Collection Time: 06/08/17  5:46 PM  Result Value Ref Range Status   Enterococcus species NOT DETECTED NOT DETECTED Final   Listeria monocytogenes NOT DETECTED NOT DETECTED Final   Staphylococcus species DETECTED (A) NOT DETECTED Final    Comment: CRITICAL RESULT CALLED TO, READ BACK BY AND VERIFIED WITH: J.LEDFORD,PHARMD [id number] 06/09/17 M.Ghassan Coggeshall    Staphylococcus aureus DETECTED (A) NOT DETECTED Final    Comment: Methicillin (oxacillin) susceptible Staphylococcus aureus (MSSA). Preferred therapy is anti staphylococcal beta lactam antibiotic (Cefazolin or Nafcillin), unless clinically contraindicated. CRITICAL RESULT CALLED TO, READ BACK BY AND VERIFIED WITH: J.LEDFORD,PHARMD [id number] 06/09/17 M.Wahid Holley    Methicillin resistance NOT DETECTED NOT DETECTED Final   Streptococcus species NOT DETECTED NOT DETECTED Final   Streptococcus agalactiae NOT DETECTED NOT DETECTED Final   Streptococcus pneumoniae NOT DETECTED NOT DETECTED Final   Streptococcus pyogenes NOT DETECTED NOT  DETECTED Final   Acinetobacter baumannii NOT DETECTED NOT DETECTED Final   Enterobacteriaceae species NOT DETECTED NOT DETECTED Final   Enterobacter cloacae complex NOT DETECTED NOT DETECTED Final   Escherichia coli NOT DETECTED NOT DETECTED Final   Klebsiella oxytoca NOT DETECTED NOT DETECTED Final   Klebsiella pneumoniae NOT DETECTED NOT DETECTED Final   Proteus species NOT DETECTED NOT DETECTED Final   Serratia marcescens NOT DETECTED NOT DETECTED Final   Haemophilus influenzae NOT DETECTED NOT DETECTED Final   Neisseria meningitidis NOT DETECTED NOT DETECTED Final   Pseudomonas aeruginosa NOT DETECTED NOT DETECTED Final  Candida albicans NOT DETECTED NOT DETECTED Final   Candida glabrata NOT DETECTED NOT DETECTED Final   Candida krusei NOT DETECTED NOT DETECTED Final   Candida parapsilosis NOT DETECTED NOT DETECTED Final   Candida tropicalis NOT DETECTED NOT DETECTED Final  Urine culture     Status: Abnormal   Collection Time: 06/08/17  7:46 PM  Result Value Ref Range Status   Specimen Description URINE, CLEAN CATCH  Final   Special Requests Normal  Final   Culture >=100,000 COLONIES/mL STAPHYLOCOCCUS AUREUS (A)  Final   Report Status 06/11/2017 FINAL  Final   Organism ID, Bacteria STAPHYLOCOCCUS AUREUS (A)  Final      Susceptibility   Staphylococcus aureus - MIC*    CIPROFLOXACIN <=0.5 SENSITIVE Sensitive     GENTAMICIN <=0.5 SENSITIVE Sensitive     NITROFURANTOIN 32 SENSITIVE Sensitive     OXACILLIN <=0.25 SENSITIVE Sensitive     TETRACYCLINE <=1 SENSITIVE Sensitive     VANCOMYCIN 1 SENSITIVE Sensitive     TRIMETH/SULFA <=10 SENSITIVE Sensitive     CLINDAMYCIN <=0.25 SENSITIVE Sensitive     RIFAMPIN <=0.5 SENSITIVE Sensitive     Inducible Clindamycin NEGATIVE Sensitive     * >=100,000 COLONIES/mL STAPHYLOCOCCUS AUREUS  Respiratory Panel by PCR     Status: None   Collection Time: 06/08/17  7:46 PM  Result Value Ref Range Status   Adenovirus NOT DETECTED NOT DETECTED  Final   Coronavirus 229E NOT DETECTED NOT DETECTED Final   Coronavirus HKU1 NOT DETECTED NOT DETECTED Final   Coronavirus NL63 NOT DETECTED NOT DETECTED Final   Coronavirus OC43 NOT DETECTED NOT DETECTED Final   Metapneumovirus NOT DETECTED NOT DETECTED Final   Rhinovirus / Enterovirus NOT DETECTED NOT DETECTED Final   Influenza A NOT DETECTED NOT DETECTED Final   Influenza B NOT DETECTED NOT DETECTED Final   Parainfluenza Virus 1 NOT DETECTED NOT DETECTED Final   Parainfluenza Virus 2 NOT DETECTED NOT DETECTED Final   Parainfluenza Virus 3 NOT DETECTED NOT DETECTED Final   Parainfluenza Virus 4 NOT DETECTED NOT DETECTED Final   Respiratory Syncytial Virus NOT DETECTED NOT DETECTED Final   Bordetella pertussis NOT DETECTED NOT DETECTED Final   Chlamydophila pneumoniae NOT DETECTED NOT DETECTED Final   Mycoplasma pneumoniae NOT DETECTED NOT DETECTED Final  CSF culture     Status: None   Collection Time: 06/08/17 11:00 PM  Result Value Ref Range Status   Specimen Description CSF  Final   Special Requests Normal  Final   Culture NO GROWTH 3 DAYS  Final   Report Status 06/12/2017 FINAL  Final  Gram stain     Status: None   Collection Time: 06/08/17 11:00 PM  Result Value Ref Range Status   Specimen Description CSF  Final   Special Requests Normal  Final   Gram Stain   Final    CYTOSPIN SLIDE WBC PRESENT,BOTH PMN AND MONONUCLEAR NO ORGANISMS SEEN    Report Status 06/08/2017 FINAL  Final  Culture, blood (single)     Status: None (Preliminary result)   Collection Time: 06/08/17 11:30 PM  Result Value Ref Range Status   Specimen Description BLOOD LEFT FOREARM  Final   Special Requests   Final    BOTTLES DRAWN AEROBIC AND ANAEROBIC Blood Culture adequate volume   Culture NO GROWTH 2 DAYS  Final   Report Status PENDING  Incomplete  MRSA PCR Screening     Status: None   Collection Time: 06/09/17  3:15 AM  Result  Value Ref Range Status   MRSA by PCR NEGATIVE NEGATIVE Final     Comment:        The GeneXpert MRSA Assay (FDA approved for NASAL specimens only), is one component of a comprehensive MRSA colonization surveillance program. It is not intended to diagnose MRSA infection nor to guide or monitor treatment for MRSA infections.   Body fluid culture     Status: None (Preliminary result)   Collection Time: 06/10/17  1:13 PM  Result Value Ref Range Status   Specimen Description SYNOVIAL LEFT KNEE  Final   Special Requests Normal  Final   Gram Stain   Final    FEW WBC PRESENT, PREDOMINANTLY PMN RARE GRAM POSITIVE COCCI IN PAIRS    Culture   Final    FEW STAPHYLOCOCCUS AUREUS SUSCEPTIBILITIES TO FOLLOW    Report Status PENDING  Incomplete  Surgical PCR screen     Status: Abnormal   Collection Time: 06/11/17  1:18 PM  Result Value Ref Range Status   MRSA, PCR NEGATIVE NEGATIVE Final   Staphylococcus aureus POSITIVE (A) NEGATIVE Final    Comment: (NOTE) The Xpert SA Assay (FDA approved for NASAL specimens in patients 52 years of age and older), is one component of a comprehensive surveillance program. It is not intended to diagnose infection nor to guide or monitor treatment.   Aerobic/Anaerobic Culture (surgical/deep wound)     Status: None (Preliminary result)   Collection Time: 06/11/17  3:21 PM  Result Value Ref Range Status   Specimen Description ABSCESS LEFT KNEE  Final   Special Requests NONE  Final   Gram Stain   Final    RARE WBC PRESENT, PREDOMINANTLY PMN RARE GRAM POSITIVE COCCI IN PAIRS    Culture   Final    FEW STAPHYLOCOCCUS AUREUS SUSCEPTIBILITIES TO FOLLOW    Report Status PENDING  Incomplete    Cliffton AstersJohn Durand Wittmeyer, MD [facility] [phone] pager   [phone] cell 06/12/2017, 12:31 PM

## 2017-06-12 NOTE — Progress Notes (Addendum)
PROGRESS NOTE    Marcus [name]  ZOX:[id number] DOB: Oct 03, 1965 DOA: 06/08/2017 PCP: Patient, No Pcp Per  Brief Narrative: PCC on transfer [facility] to [facility] on the 1/10 Briefly [name] is a 52 year old male with history of polysubstance abuse and hep C was found down by his significant other, subsequently brought to the emergency room, he was obtunded and found to have severe metabolic encephalopathy, infective endocarditis, MSSA bacteremia, septic emboli/embolic strokes and left knee septic arthritis. He is being followed by infectious disease and orthopedics, now on IV nafcillin, underwent left knee I & D on 1/9   Assessment & Plan:   Severe sepsis /MSSA endocarditis complicated by septic embolic strokes and left knee septic arthritis -History of polysubstance abuse however no clear history obtained to suggest IV drug use -We'll repeat blood cultures -2D ECHO with mitral valve vegetation -ID following -will likely need atleast 6 weeks of IV Nafcillin   Left Knee Septic Arthritis -FU cultures, but expect MSSA -s/p incision and debridement on 1/9 by Dr. 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 -With resultant left-sided weakness, left field cut, some dysarthria and dysphagia -Treatment for this is antibiotics for infective endocarditis, continue nafcillin -Will request physical, occupational and speech therapy evaluation -Appreciate neurology input recommended follow-up with [facility] in 4-6 weeks  Hepatitis C antibody positive -Checking hep C genotype and viral load -Follow-up with ID after acute illness is resolved  Polysubstance abuse -Per family history of substance abuse but no known history of IV drug use as far as they know -UDS positive for cocaine, opiates and benzos on admission  Acute kidney injury -Likely secondary to sepsis, creatinine was 3.0 on admission now down to 1.8 -Baseline unknown -Kidney function is improving, cut down IV  fluids -Monitor  Thrombocytopenia -Baseline unknown unclear if he has underlying liver disease -Likely worsened in the setting of sepsis -Monitor  DVT prophylaxis: SCDs, due to low platelets  Code Status:  full code  Family Communication: son at bedside  Disposition Plan: Transfer to telemetry   Consultants:   ID  PCCM  Ortho   neurology  Procedures: PROCEDURE: Arthrotomy of left knee and irrigation and debridement for septic arthritis 1/9   2-D echocardiogram   Antimicrobials:  Antibiotics Given (last 72 hours)    Date/Time Action Medication Dose Rate   06/09/17 1412 New Bag/Given   nafcillin 2 g in dextrose 5 % 100 mL IVPB 2 g 200 mL/hr   06/09/17 1807 New Bag/Given   nafcillin 2 g in dextrose 5 % 100 mL IVPB 2 g 200 mL/hr   06/09/17 2230 New Bag/Given   nafcillin 2 g in dextrose 5 % 100 mL IVPB 2 g 200 mL/hr   06/10/17 0203 New Bag/Given   nafcillin 2 g in dextrose 5 % 100 mL IVPB 2 g 200 mL/hr   06/10/17 0525 New Bag/Given   nafcillin 2 g in dextrose 5 % 100 mL IVPB 2 g 200 mL/hr   06/10/17 0941 New Bag/Given   nafcillin 2 g in dextrose 5 % 100 mL IVPB 2 g 200 mL/hr   06/10/17 1445 New Bag/Given   nafcillin 2 g in dextrose 5 % 100 mL IVPB 2 g 200 mL/hr   06/10/17 1738 New Bag/Given   nafcillin 2 g in dextrose 5 % 100 mL IVPB 2 g 200 mL/hr   06/10/17 2207 New Bag/Given   nafcillin 2 g in dextrose 5 % 100 mL IVPB 2 g 200 mL/hr  06/11/17 0200 New Bag/Given   nafcillin 2 g in dextrose 5 % 100 mL IVPB 2 g 200 mL/hr   06/11/17 0603 New Bag/Given   nafcillin 2 g in dextrose 5 % 100 mL IVPB 2 g 200 mL/hr   06/11/17 0910 New Bag/Given   nafcillin 2 g in dextrose 5 % 100 mL IVPB 2 g 200 mL/hr   06/11/17 1337 New Bag/Given   nafcillin 2 g in dextrose 5 % 100 mL IVPB 2 g 200 mL/hr   06/11/17 1738 New Bag/Given   nafcillin 2 g in dextrose 5 % 100 mL IVPB 2 g 200 mL/hr   06/11/17 2226 New Bag/Given   nafcillin 2 g in dextrose 5 % 100 mL IVPB 2 g 200 mL/hr    06/12/17 0111 New Bag/Given   nafcillin 2 g in dextrose 5 % 100 mL IVPB 2 g 200 mL/hr   06/12/17 0506 New Bag/Given   nafcillin 2 g in dextrose 5 % 100 mL IVPB 2 g 200 mL/hr      Subjective: -no issues overnight, somnolent but arouses and interacts appropriately  Objective: Vitals:   06/11/17 2000 06/11/17 2300 06/12/17 0427 06/12/17 0804  BP: 109/71 113/65 116/68 107/66  Pulse: (!) 110 (!) 121 (!) 114 92  Resp: 19 (!) 29 (!) 32 (!) 26  Temp: 99.7 F (37.6 C) 98.9 F (37.2 C) (!) 100.4 F (38 C) 99.2 F (37.3 C)  TempSrc: Oral Oral Oral Axillary  SpO2: 97% 100% 95% 98%  Weight:   77.6 kg (171 lb 1.2 oz)   Height:        Intake/Output Summary (Last 24 hours) at 06/12/2017 1053 Last data filed at 06/12/2017 0145 Gross per 24 hour  Intake 2708.5 ml  Output 800 ml  Net 1908.5 ml   Filed Weights   06/11/17 0600 06/11/17 1317 06/12/17 0427  Weight: 76.7 kg (169 lb 1.5 oz) 76.7 kg (169 lb 1.5 oz) 77.6 kg (171 lb 1.2 oz)    Examination:  General exam:  chronically ill-appearing male, somnolent easily arousable Respiratory system: Clear bilaterally. Respiratory effort normal. Cardiovascular system: S1 & S2 heard, RRR.  Gastrointestinal system: Abdomen is nondistended, soft and nontender.Normal bowel sounds heard. Central nervous system: Alert and oriented. No focal neurological deficits. Extremities: Left knee with dressing   Skin: No rashes, lesions or ulcers Neuro : Left upper extremity is mostly flaccid with minimal strength 2/ 5, left lower leg poor effort but appears weaker too, right upper sternal and lower extremity is 4/5  Psychiatry: Judgement and insight appear normal. Mood & affect appropriate.     Data Reviewed:   CBC: Recent Labs  Lab 06/08/17 1746 06/08/17 1757 06/09/17 0719 06/10/17 0451  WBC 33.5*  --  20.6* 11.8*  NEUTROABS 32.2*  --   --   --   HGB 16.4 17.7* 11.5* 10.0*  HCT 46.9 52.0 33.2* 29.6*  MCV 82.4  --  81.6 81.3  PLT 134*  --  75*  55*   Basic Metabolic Panel: Recent Labs  Lab 06/08/17 1746 06/08/17 1757 06/08/17 2337 06/09/17 0719 06/10/17 0451 06/10/17 1249  NA 126* 126* 129* 131* 129* 130*  K 5.1 5.5* 3.8 3.6 2.7* 3.1*  CL 85* 91* 96* 98* 100* 101  CO2 16*  --  18* 20* 17* 20*  GLUCOSE 147* 152* 140* 131* 117* 108*  BUN 79* 77* 84* 82* 68* 58*  CREATININE 3.00* 3.00* 2.94* 2.65* 2.01* 1.89*  CALCIUM 7.9*  --  6.6*  6.7* 6.3* 6.8*  MG  --   --   --  2.4 2.2  --   PHOS  --   --   --  6.5* 4.1  --    GFR: Estimated Creatinine Clearance: 50.8 mL/min (A) (by C-G formula based on SCr of 1.89 mg/dL (H)). Liver Function Tests: Recent Labs  Lab 06/08/17 1746 06/08/17 2337 06/09/17 0719  AST 187* 205* 204*  ALT 107* 98* 101*  ALKPHOS 190* 105 92  BILITOT 2.2* 1.4* 1.1  PROT 7.4 5.1* 4.7*  ALBUMIN 2.3* 1.6* 1.4*   Recent Labs  Lab 06/08/17 1746  LIPASE 19   No results for input(s): AMMONIA in the last 168 hours. Coagulation Profile: Recent Labs  Lab 06/08/17 1746  INR 1.49   Cardiac Enzymes: Recent Labs  Lab 06/08/17 1746 06/08/17 2337 06/09/17 0719 06/09/17 1224 06/09/17 1832  CKTOTAL 791*  --  6,435*  --   --   TROPONINI  --  0.36* 0.29* 0.22* 0.24*   BNP (last 3 results) No results for input(s): PROBNP in the last 8760 hours. HbA1C: Recent Labs    06/10/17 0451  HGBA1C 5.9*   CBG: Recent Labs  Lab 06/11/17 0804 06/11/17 1132 06/11/17 2004 06/12/17 0007 06/12/17 0807  GLUCAP 104* 90 157* 189* 135*   Lipid Profile: Recent Labs    06/10/17 0451  CHOL 61  HDL <10*  LDLCALC NOT CALCULATED  TRIG 82  CHOLHDL NOT CALCULATED   Thyroid Function Tests: No results for input(s): TSH, T4TOTAL, FREET4, T3FREE, THYROIDAB in the last 72 hours. Anemia Panel: No results for input(s): VITAMINB12, FOLATE, FERRITIN, TIBC, IRON, RETICCTPCT in the last 72 hours. Urine analysis:    Component Value Date/Time   COLORURINE AMBER (A) 06/08/2017 1724   APPEARANCEUR CLOUDY (A)  06/08/2017 1724   LABSPEC 1.016 06/08/2017 1724   PHURINE 5.0 06/08/2017 1724   GLUCOSEU NEGATIVE 06/08/2017 1724   HGBUR LARGE (A) 06/08/2017 1724   BILIRUBINUR NEGATIVE 06/08/2017 1724   KETONESUR NEGATIVE 06/08/2017 1724   PROTEINUR NEGATIVE 06/08/2017 1724   NITRITE NEGATIVE 06/08/2017 1724   LEUKOCYTESUR NEGATIVE 06/08/2017 1724   Sepsis Labs: @LABRCNTIP (procalcitonin:4,lacticidven:4)  ) Recent Results (from the past 240 hour(s))  Culture, blood (routine x 2)     Status: Abnormal   Collection Time: 06/08/17  5:30 PM  Result Value Ref Range Status   Specimen Description BLOOD LEFT ARM  Final   Special Requests IN PEDIATRIC BOTTLE Blood Culture adequate volume  Final   Culture  Setup Time   Final    GRAM POSITIVE COCCI IN CLUSTERS IN PEDIATRIC BOTTLE CRITICAL VALUE NOTED.  VALUE IS CONSISTENT WITH PREVIOUSLY REPORTED AND CALLED VALUE.    Culture (A)  Final    STAPHYLOCOCCUS AUREUS SUSCEPTIBILITIES PERFORMED ON PREVIOUS CULTURE WITHIN THE LAST 5 DAYS.    Report Status 06/11/2017 FINAL  Final  Culture, blood (routine x 2)     Status: Abnormal   Collection Time: 06/08/17  5:46 PM  Result Value Ref Range Status   Specimen Description BLOOD RIGHT ANTECUBITAL  Final   Special Requests   Final    BOTTLES DRAWN AEROBIC AND ANAEROBIC Blood Culture adequate volume   Culture  Setup Time   Final    IN BOTH AEROBIC AND ANAEROBIC BOTTLES GRAM POSITIVE COCCI IN CLUSTERS CRITICAL RESULT CALLED TO, READ BACK BY AND VERIFIED WITH: J.LEDFORD,PHARMD [phone] 06/09/17 M.CAMPBELL    Culture STAPHYLOCOCCUS AUREUS (A)  Final   Report Status 06/11/2017 FINAL  Final  Organism ID, Bacteria STAPHYLOCOCCUS AUREUS  Final      Susceptibility   Staphylococcus aureus - MIC*    CIPROFLOXACIN <=0.5 SENSITIVE Sensitive     ERYTHROMYCIN <=0.25 SENSITIVE Sensitive     GENTAMICIN <=0.5 SENSITIVE Sensitive     OXACILLIN <=0.25 SENSITIVE Sensitive     TETRACYCLINE <=1 SENSITIVE Sensitive     VANCOMYCIN  <=0.5 SENSITIVE Sensitive     TRIMETH/SULFA <=10 SENSITIVE Sensitive     CLINDAMYCIN <=0.25 SENSITIVE Sensitive     RIFAMPIN <=0.5 SENSITIVE Sensitive     Inducible Clindamycin NEGATIVE Sensitive     * STAPHYLOCOCCUS AUREUS  Blood Culture ID Panel (Reflexed)     Status: Abnormal   Collection Time: 06/08/17  5:46 PM  Result Value Ref Range Status   Enterococcus species NOT DETECTED NOT DETECTED Final   Listeria monocytogenes NOT DETECTED NOT DETECTED Final   Staphylococcus species DETECTED (A) NOT DETECTED Final    Comment: CRITICAL RESULT CALLED TO, READ BACK BY AND VERIFIED WITH: J.LEDFORD,PHARMD [id number] 06/09/17 M.CAMPBELL    Staphylococcus aureus DETECTED (A) NOT DETECTED Final    Comment: Methicillin (oxacillin) susceptible Staphylococcus aureus (MSSA). Preferred therapy is anti staphylococcal beta lactam antibiotic (Cefazolin or Nafcillin), unless clinically contraindicated. CRITICAL RESULT CALLED TO, READ BACK BY AND VERIFIED WITH: J.LEDFORD,PHARMD [id number] 06/09/17 M.CAMPBELL    Methicillin resistance NOT DETECTED NOT DETECTED Final   Streptococcus species NOT DETECTED NOT DETECTED Final   Streptococcus agalactiae NOT DETECTED NOT DETECTED Final   Streptococcus pneumoniae NOT DETECTED NOT DETECTED Final   Streptococcus pyogenes NOT DETECTED NOT DETECTED Final   Acinetobacter baumannii NOT DETECTED NOT DETECTED Final   Enterobacteriaceae species NOT DETECTED NOT DETECTED Final   Enterobacter cloacae complex NOT DETECTED NOT DETECTED Final   Escherichia coli NOT DETECTED NOT DETECTED Final   Klebsiella oxytoca NOT DETECTED NOT DETECTED Final   Klebsiella pneumoniae NOT DETECTED NOT DETECTED Final   Proteus species NOT DETECTED NOT DETECTED Final   Serratia marcescens NOT DETECTED NOT DETECTED Final   Haemophilus influenzae NOT DETECTED NOT DETECTED Final   Neisseria meningitidis NOT DETECTED NOT DETECTED Final   Pseudomonas aeruginosa NOT DETECTED NOT DETECTED Final   Candida  albicans NOT DETECTED NOT DETECTED Final   Candida glabrata NOT DETECTED NOT DETECTED Final   Candida krusei NOT DETECTED NOT DETECTED Final   Candida parapsilosis NOT DETECTED NOT DETECTED Final   Candida tropicalis NOT DETECTED NOT DETECTED Final  Urine culture     Status: Abnormal   Collection Time: 06/08/17  7:46 PM  Result Value Ref Range Status   Specimen Description URINE, CLEAN CATCH  Final   Special Requests Normal  Final   Culture >=100,000 COLONIES/mL STAPHYLOCOCCUS AUREUS (A)  Final   Report Status 06/11/2017 FINAL  Final   Organism ID, Bacteria STAPHYLOCOCCUS AUREUS (A)  Final      Susceptibility   Staphylococcus aureus - MIC*    CIPROFLOXACIN <=0.5 SENSITIVE Sensitive     GENTAMICIN <=0.5 SENSITIVE Sensitive     NITROFURANTOIN 32 SENSITIVE Sensitive     OXACILLIN <=0.25 SENSITIVE Sensitive     TETRACYCLINE <=1 SENSITIVE Sensitive     VANCOMYCIN 1 SENSITIVE Sensitive     TRIMETH/SULFA <=10 SENSITIVE Sensitive     CLINDAMYCIN <=0.25 SENSITIVE Sensitive     RIFAMPIN <=0.5 SENSITIVE Sensitive     Inducible Clindamycin NEGATIVE Sensitive     * >=100,000 COLONIES/mL STAPHYLOCOCCUS AUREUS  Respiratory Panel by PCR     Status: None   Collection Time: 06/08/17  7:46 PM  Result Value Ref Range Status   Adenovirus NOT DETECTED NOT DETECTED Final   Coronavirus 229E NOT DETECTED NOT DETECTED Final   Coronavirus HKU1 NOT DETECTED NOT DETECTED Final   Coronavirus NL63 NOT DETECTED NOT DETECTED Final   Coronavirus OC43 NOT DETECTED NOT DETECTED Final   Metapneumovirus NOT DETECTED NOT DETECTED Final   Rhinovirus / Enterovirus NOT DETECTED NOT DETECTED Final   Influenza A NOT DETECTED NOT DETECTED Final   Influenza B NOT DETECTED NOT DETECTED Final   Parainfluenza Virus 1 NOT DETECTED NOT DETECTED Final   Parainfluenza Virus 2 NOT DETECTED NOT DETECTED Final   Parainfluenza Virus 3 NOT DETECTED NOT DETECTED Final   Parainfluenza Virus 4 NOT DETECTED NOT DETECTED Final    Respiratory Syncytial Virus NOT DETECTED NOT DETECTED Final   Bordetella pertussis NOT DETECTED NOT DETECTED Final   Chlamydophila pneumoniae NOT DETECTED NOT DETECTED Final   Mycoplasma pneumoniae NOT DETECTED NOT DETECTED Final  CSF culture     Status: None   Collection Time: 06/08/17 11:00 PM  Result Value Ref Range Status   Specimen Description CSF  Final   Special Requests Normal  Final   Culture NO GROWTH 3 DAYS  Final   Report Status 06/12/2017 FINAL  Final  Gram stain     Status: None   Collection Time: 06/08/17 11:00 PM  Result Value Ref Range Status   Specimen Description CSF  Final   Special Requests Normal  Final   Gram Stain   Final    CYTOSPIN SLIDE WBC PRESENT,BOTH PMN AND MONONUCLEAR NO ORGANISMS SEEN    Report Status 06/08/2017 FINAL  Final  Culture, blood (single)     Status: None (Preliminary result)   Collection Time: 06/08/17 11:30 PM  Result Value Ref Range Status   Specimen Description BLOOD LEFT FOREARM  Final   Special Requests   Final    BOTTLES DRAWN AEROBIC AND ANAEROBIC Blood Culture adequate volume   Culture NO GROWTH 2 DAYS  Final   Report Status PENDING  Incomplete  MRSA PCR Screening     Status: None   Collection Time: 06/09/17  3:15 AM  Result Value Ref Range Status   MRSA by PCR NEGATIVE NEGATIVE Final    Comment:        The GeneXpert MRSA Assay (FDA approved for NASAL specimens only), is one component of a comprehensive MRSA colonization surveillance program. It is not intended to diagnose MRSA infection nor to guide or monitor treatment for MRSA infections.   Body fluid culture     Status: None (Preliminary result)   Collection Time: 06/10/17  1:13 PM  Result Value Ref Range Status   Specimen Description SYNOVIAL LEFT KNEE  Final   Special Requests Normal  Final   Gram Stain   Final    FEW WBC PRESENT, PREDOMINANTLY PMN RARE GRAM POSITIVE COCCI IN PAIRS    Culture   Final    FEW STAPHYLOCOCCUS AUREUS SUSCEPTIBILITIES TO  FOLLOW    Report Status PENDING  Incomplete  Surgical PCR screen     Status: Abnormal   Collection Time: 06/11/17  1:18 PM  Result Value Ref Range Status   MRSA, PCR NEGATIVE NEGATIVE Final   Staphylococcus aureus POSITIVE (A) NEGATIVE Final    Comment: (NOTE) The Xpert SA Assay (FDA approved for NASAL specimens in patients 14 years of age and older), is one component of a comprehensive surveillance program. It is not intended to diagnose infection nor to  guide or monitor treatment.   Aerobic/Anaerobic Culture (surgical/deep wound)     Status: None (Preliminary result)   Collection Time: 06/11/17  3:21 PM  Result Value Ref Range Status   Specimen Description ABSCESS LEFT KNEE  Final   Special Requests NONE  Final   Gram Stain   Final    RARE WBC PRESENT, PREDOMINANTLY PMN RARE GRAM POSITIVE COCCI IN PAIRS    Culture PENDING  Incomplete   Report Status PENDING  Incomplete         Radiology Studies: No results found.      Scheduled Meds: . bupivacaine  10 mL Infiltration Once  . lidocaine  5 mL Other Once  . pantoprazole (PROTONIX) IV  40 mg Intravenous Q24H  . thiamine injection  100 mg Intravenous Daily   Continuous Infusions: . sodium chloride    . sodium chloride 75 mL/hr at 06/12/17 0818  . nafcillin IV Stopped (06/12/17 0540)     LOS: 3 days    Time spent:    Zannie Cove, MD [facility] Page via [email or web address], password [email or web address] After 7PM please contact night-coverage  06/12/2017, 10:53 AM

## 2017-06-13 ENCOUNTER — Other Ambulatory Visit: Payer: Self-pay

## 2017-06-13 ENCOUNTER — Encounter (HOSPITAL_COMMUNITY): Payer: Self-pay | Admitting: General Practice

## 2017-06-13 DIAGNOSIS — M7989 Other specified soft tissue disorders: Secondary | ICD-10-CM

## 2017-06-13 DIAGNOSIS — I330 Acute and subacute infective endocarditis: Secondary | ICD-10-CM

## 2017-06-13 DIAGNOSIS — K0889 Other specified disorders of teeth and supporting structures: Secondary | ICD-10-CM

## 2017-06-13 DIAGNOSIS — B9561 Methicillin susceptible Staphylococcus aureus infection as the cause of diseases classified elsewhere: Secondary | ICD-10-CM

## 2017-06-13 LAB — BODY FLUID CULTURE: Special Requests: NORMAL

## 2017-06-13 LAB — BASIC METABOLIC PANEL
Anion gap: 9 (ref 5–15)
BUN: 23 mg/dL — AB (ref 6–20)
CO2: 21 mmol/L — AB (ref 22–32)
Calcium: 6.7 mg/dL — ABNORMAL LOW (ref 8.9–10.3)
Chloride: 102 mmol/L (ref 101–111)
Creatinine, Ser: 1.4 mg/dL — ABNORMAL HIGH (ref 0.61–1.24)
GFR calc Af Amer: >60 mL/min (ref >60)
GFR, EST NON AFRICAN AMERICAN: 57 mL/min — AB (ref >60)
GLUCOSE: 159 mg/dL — AB (ref 65–99)
POTASSIUM: 2.2 mmol/L — AB (ref 3.5–5.1)
Sodium: 132 mmol/L — ABNORMAL LOW (ref 135–145)

## 2017-06-13 LAB — CBC
HEMATOCRIT: 26.3 % — AB (ref 39.0–52.0)
Hemoglobin: 8.9 g/dL — ABNORMAL LOW (ref 13.0–17.0)
MCH: 27.8 pg (ref 26.0–34.0)
MCHC: 33.8 g/dL (ref 30.0–36.0)
MCV: 82.2 fL (ref 78.0–100.0)
PLATELETS: 142 10*3/uL — AB (ref 150–400)
RBC: 3.2 MIL/uL — AB (ref 4.22–5.81)
RDW: 14.8 % (ref 11.5–15.5)
WBC: 13.1 10*3/uL — ABNORMAL HIGH (ref 4.0–10.5)

## 2017-06-13 LAB — HEPATITIS C GENOTYPE

## 2017-06-13 LAB — GLUCOSE, CAPILLARY
GLUCOSE-CAPILLARY: 98 mg/dL (ref 65–99)
GLUCOSE-CAPILLARY: 151 mg/dL — AB (ref 65–99)
Glucose-Capillary: 114 mg/dL — ABNORMAL HIGH (ref 65–99)
Glucose-Capillary: 119 mg/dL — ABNORMAL HIGH (ref 65–99)
Glucose-Capillary: 136 mg/dL — ABNORMAL HIGH (ref 65–99)
Glucose-Capillary: 136 mg/dL — ABNORMAL HIGH (ref 65–99)

## 2017-06-13 LAB — MAGNESIUM: Magnesium: 1.7 mg/dL (ref 1.7–2.4)

## 2017-06-13 MED ORDER — INFLUENZA VAC SPLIT QUAD 0.5 ML IM SUSY
0.5000 mL | PREFILLED_SYRINGE | INTRAMUSCULAR | Status: DC
  Filled 2017-06-13: qty 0.5

## 2017-06-13 MED ORDER — POTASSIUM CHLORIDE 10 MEQ/100ML IV SOLN
10.0000 meq | INTRAVENOUS | Status: AC
  Administered 2017-06-13 (×6): 10 meq via INTRAVENOUS
  Filled 2017-06-13 (×6): qty 100

## 2017-06-13 MED ORDER — POTASSIUM CHLORIDE CRYS ER 20 MEQ PO TBCR
40.0000 meq | EXTENDED_RELEASE_TABLET | ORAL | Status: AC
  Administered 2017-06-13 (×4): 40 meq via ORAL
  Filled 2017-06-13 (×4): qty 2

## 2017-06-13 NOTE — Evaluation (Signed)
Clinical/Bedside Swallow Evaluation Patient Details  Name: Marcus [name] MRN: [id number] Date of Birth: Sep 20, 1965  Today's Date: 06/13/2017 Time: SLP Start Time (ACUTE ONLY): 0815 SLP Stop Time (ACUTE ONLY): 0845 SLP Time Calculation (min) (ACUTE ONLY): 30 min  Past Medical History: History reviewed. No pertinent past medical history. Past Surgical History:  Past Surgical History:  Procedure Laterality Date  . ANKLE SURGERY    . HIP FRACTURE SURGERY Left 1994  . HIP SURGERY    . IRRIGATION AND DEBRIDEMENT KNEE Left 06/11/2017   Procedure: IRRIGATION AND DEBRIDEMENT KNEE;  Surgeon: Tarry Kos, MD;  Location: [facility] OR;  Service: Orthopedics;  Laterality: Left;   HPI:  This is a 52 yo man with no notable past medical history, here with altered mental status. Hsitory obtained from EDP. No family at bedside. Pt not following commands but winces to pain.  Per chart review, pt was last seen normal by his girlfriend at 12:00, and later she found him on the ground, on his right side, unresponsive. He was tachycardic in ED. He has history fo opiate use- UDS was positive for cocaine and opiates and benzo.  MRI is showing numerous small foci of acute/early sub-acute infarct in right greater then left posterior hemipheres, basal ganglia, brainstem and cerebellum.  Chest xray is showing no active disease.     Assessment / Plan / Recommendation Clinical Impression  Clinical swallowing evaluation was completed using thin liquids via cup and straw sips, pureed material and soft solids.  Oral mechanism exam was completed and unremarkable.  The patient reported no baseline swallowing issues but does state he currently has a decreased appetite.  The patient presented with a possible oral and pharngeal dysphagia.  The oral phase was characterized by delayed oral transit with removal of soft solids from the oral cavity needed.  The pharyngeal phase was characterized by delayed throat clear given serial sps of thin  liquids via straw sips while trying to complete a 3 oz water challenge.  Swallow trigger appeared to be timely and hyo-laryngeal excursion was appreciated to palpation.  Recommend a dysphagia 1 diet with thin liquids.  The patient should take 1 sip at a time and be completely upright.  Suggest medications whole in pureed material.  ST will follow up for therapeutic diet tolerance and possible diet advancement vs need for instrumental exam.     SLP Visit Diagnosis: Dysphagia, oropharyngeal phase (R13.12)    Aspiration Risk  Mild aspiration risk    Diet Recommendation   Dysphagia 1 with thin liquids  Medication Administration: Whole meds with puree    Other  Recommendations Oral Care Recommendations: Oral care BID   Follow up Recommendations Other (comment)(TBD)      Frequency and Duration min 2x/week  2 weeks       Prognosis Prognosis for Safe Diet Advancement: Good Barriers to Reach Goals: Cognitive deficits      Swallow Study   General Date of Onset: 06/08/17 HPI: This is a 52 yo man with no notable past medical history, here with altered mental status. Hsitory obtained from EDP. No family at bedside. Pt not following commands but winces to pain.  Per chart review, pt was last seen normal by his girlfriend at 12:00, and later she found him on the ground, on his right side, unresponsive. He was tachycardic in ED. He has history fo opiate use- UDS was positive for cocaine and opiates and benzo.  MRI is showing numerous small foci of acute/early sub-acute  infarct in right greater then left posterior hemipheres, basal ganglia, brainstem and cerebellum.  Chest xray is showing no active disease.   Type of Study: Bedside Swallow Evaluation [facility]   Diet Prior to this Study: Regular;Thin liquids Temperature Spikes Noted: Yes Respiratory Status: Room air History of Recent Intubation: Yes Length of Intubations (days): (for a few hours) Date extubated:  06/11/17 Behavior/Cognition: Alert Oral Cavity Assessment: Within Functional Limits Oral Care Completed by SLP: No Oral Cavity - Dentition: Poor condition;Missing dentition Vision: Functional for self-feeding Self-Feeding Abilities: Total assist Patient Positioning: Upright in bed Baseline Vocal Quality: Normal Volitional Cough: Weak Volitional Swallow: Able to elicit    Oral/Motor/Sensory Function Overall Oral Motor/Sensory Function: Within functional limits   Ice Chips Ice chips: Not tested   Thin Liquid Thin Liquid: Impaired Presentation: Spoon;Straw Pharyngeal  Phase Impairments: Throat Clearing - Delayed(while completing the 3 oz water challenge via straw)    Nectar Thick Nectar Thick Liquid: Not tested   Honey Thick Honey Thick Liquid: Not tested   Puree Puree: Impaired Presentation: Spoon Oral Phase Impairments: Impaired mastication Oral Phase Functional Implications: Prolonged oral transit   Solid   GO   Solid: Impaired Presentation: Spoon Oral Phase Impairments: Impaired mastication Oral Phase Functional Implications: Prolonged oral transit(material removed from oral cavity)        Dimas AguasMelissa Mabelle Mungin, MA, CCC-SLP Acute Rehab SLP [phone] Fleet ContrasMelissa N Estalee Mccandlish 06/13/2017,8:52 AM

## 2017-06-13 NOTE — Progress Notes (Signed)
Patient ID: Marcus [name], male   DOB: 02-26-1966, 52 y.o.   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 He needs 6 weeks of IV antibiotic therapy.  He was not accepted by a skilled nursing facility because of the cost of nafcillin (a surprisingly high $900 daily).  Unfortunately there are very few, if any, alternatives given his central nervous system involvement.  PLAN: 1. Continue nafcillin for 6 weeks through 07/19/2017 2. Please call me for any infectious disease questions this weekend  Principal Problem:   Bacteremia due to methicillin susceptible Staphylococcus aureus (MSSA) Active Problems:   Meningitis due to bacteria   Septic embolism (HCC)   Endocarditis of mitral valve   Septic arthritis of knee, left (HCC)   Hepatitis C antibody test positive   Encephalopathy   Sepsis (HCC)   AKI (acute kidney injury) (HCC)   Elevated liver enzymes   Normocytic anemia   Thrombocytopenia (HCC)   Polysubstance abuse (HCC)   Poor dentition   Acute bacterial endocarditis   Altered mental status   Hyperkalemia   Scheduled Meds: . pantoprazole  40 mg Oral Q1200  . potassium chloride  40 mEq Oral Q2H  . thiamine  100 mg Oral Daily   Continuous Infusions: . sodium chloride    . sodium chloride 75 mL/hr at 06/13/17 0743  . nafcillin IV Stopped (06/13/17 1008)  . potassium chloride 10 mEq (06/13/17 1049)   PRN Meds:.sodium chloride, acetaminophen, morphine injection   SUBJECTIVE: He states that he is feeling better today.    Review of Systems: Review of Systems  Unable to perform ROS: Mental acuity    No Known Allergies  OBJECTIVE: Vitals:   06/12/17 2048 06/13/17 0438 06/13/17 0438 06/13/17 0449  BP: (!) 117/56 114/70 114/70   Pulse: (!) 105 (!) 101 (!) 101   Resp: 17 18 18      Temp: 99.4 F (37.4 C) 98.6 F (37 C) 98.6 F (37 C)   TempSrc: Oral Oral Oral   SpO2: 98% 99% 99%   Weight:    184 lb 15.5 oz (83.9 kg)  Height:       Body mass index is 25.8 kg/m.  Physical Exam  Constitutional: He is oriented to person, place, and time.  He is alert and interactive today but still answers questions very slowly.  Several family members are visiting.  HENT:  Mouth/Throat: No oropharyngeal exudate.  Poor dentition.  Cardiovascular: Normal rate and regular rhythm.  No murmur heard. Pulmonary/Chest: Effort normal. He has no wheezes. He has no rales.  Abdominal: Soft. There is no tenderness.  Musculoskeletal:  Left knee less swollen.  Neurological: He is alert and oriented to person, place, and time.    Lab Results Lab Results  Component Value Date   WBC 13.1 (H) 06/13/2017   HGB 8.9 (L) 06/13/2017   HCT 26.3 (L) 06/13/2017   MCV 82.2 06/13/2017   PLT 142 (L) 06/13/2017    Lab Results  Component Value Date   CREATININE 1.40 (H) 06/13/2017   BUN 23 (H) 06/13/2017   NA 132 (L) 06/13/2017   K 2.2 (LL) 06/13/2017   CL 102 06/13/2017   CO2 21 (L) 06/13/2017    Lab Results  Component Value Date  ALT 101 (H) 06/09/2017   AST 204 (H) 06/09/2017   ALKPHOS 92 06/09/2017   BILITOT 1.1 06/09/2017     Microbiology: Recent Results (from the past 240 hour(s))  Culture, blood (routine x 2)     Status: Abnormal   Collection Time: 06/08/17  5:30 PM  Result Value Ref Range Status   Specimen Description BLOOD LEFT ARM  Final   Special Requests IN PEDIATRIC BOTTLE Blood Culture adequate volume  Final   Culture  Setup Time   Final    GRAM POSITIVE COCCI IN CLUSTERS IN PEDIATRIC BOTTLE CRITICAL VALUE NOTED.  VALUE IS CONSISTENT WITH PREVIOUSLY REPORTED AND CALLED VALUE.    Culture (A)  Final    STAPHYLOCOCCUS AUREUS SUSCEPTIBILITIES PERFORMED ON PREVIOUS CULTURE WITHIN THE LAST 5 DAYS.    Report Status 06/11/2017 FINAL  Final  Culture, blood (routine x  2)     Status: Abnormal   Collection Time: 06/08/17  5:46 PM  Result Value Ref Range Status   Specimen Description BLOOD RIGHT ANTECUBITAL  Final   Special Requests   Final    BOTTLES DRAWN AEROBIC AND ANAEROBIC Blood Culture adequate volume   Culture  Setup Time   Final    IN BOTH AEROBIC AND ANAEROBIC BOTTLES GRAM POSITIVE COCCI IN CLUSTERS CRITICAL RESULT CALLED TO, READ BACK BY AND VERIFIED WITH: J.LEDFORD,PHARMD [id number] 06/09/17 M.Isidro Monks    Culture STAPHYLOCOCCUS AUREUS (A)  Final   Report Status 06/11/2017 FINAL  Final   Organism ID, Bacteria STAPHYLOCOCCUS AUREUS  Final      Susceptibility   Staphylococcus aureus - MIC*    CIPROFLOXACIN <=0.5 SENSITIVE Sensitive     ERYTHROMYCIN <=0.25 SENSITIVE Sensitive     GENTAMICIN <=0.5 SENSITIVE Sensitive     OXACILLIN <=0.25 SENSITIVE Sensitive     TETRACYCLINE <=1 SENSITIVE Sensitive     VANCOMYCIN <=0.5 SENSITIVE Sensitive     TRIMETH/SULFA <=10 SENSITIVE Sensitive     CLINDAMYCIN <=0.25 SENSITIVE Sensitive     RIFAMPIN <=0.5 SENSITIVE Sensitive     Inducible Clindamycin NEGATIVE Sensitive     * STAPHYLOCOCCUS AUREUS  Blood Culture ID Panel (Reflexed)     Status: Abnormal   Collection Time: 06/08/17  5:46 PM  Result Value Ref Range Status   Enterococcus species NOT DETECTED NOT DETECTED Final   Listeria monocytogenes NOT DETECTED NOT DETECTED Final   Staphylococcus species DETECTED (A) NOT DETECTED Final    Comment: CRITICAL RESULT CALLED TO, READ BACK BY AND VERIFIED WITH: J.LEDFORD,PHARMD [id number] 06/09/17 M.Mazin Emma    Staphylococcus aureus DETECTED (A) NOT DETECTED Final    Comment: Methicillin (oxacillin) susceptible Staphylococcus aureus (MSSA). Preferred therapy is anti staphylococcal beta lactam antibiotic (Cefazolin or Nafcillin), unless clinically contraindicated. CRITICAL RESULT CALLED TO, READ BACK BY AND VERIFIED WITH: J.LEDFORD,PHARMD [phone] 06/09/17 M.Carthel Castille    Methicillin resistance NOT DETECTED NOT DETECTED Final     Streptococcus species NOT DETECTED NOT DETECTED Final   Streptococcus agalactiae NOT DETECTED NOT DETECTED Final   Streptococcus pneumoniae NOT DETECTED NOT DETECTED Final   Streptococcus pyogenes NOT DETECTED NOT DETECTED Final   Acinetobacter baumannii NOT DETECTED NOT DETECTED Final   Enterobacteriaceae species NOT DETECTED NOT DETECTED Final   Enterobacter cloacae complex NOT DETECTED NOT DETECTED Final   Escherichia coli NOT DETECTED NOT DETECTED Final   Klebsiella oxytoca NOT DETECTED NOT DETECTED Final   Klebsiella pneumoniae NOT DETECTED NOT DETECTED Final   Proteus species NOT DETECTED NOT DETECTED Final   Serratia marcescens NOT DETECTED NOT DETECTED Final  Haemophilus influenzae NOT DETECTED NOT DETECTED Final   Neisseria meningitidis NOT DETECTED NOT DETECTED Final   Pseudomonas aeruginosa NOT DETECTED NOT DETECTED Final   Candida albicans NOT DETECTED NOT DETECTED Final   Candida glabrata NOT DETECTED NOT DETECTED Final   Candida krusei NOT DETECTED NOT DETECTED Final   Candida parapsilosis NOT DETECTED NOT DETECTED Final   Candida tropicalis NOT DETECTED NOT DETECTED Final  Urine culture     Status: Abnormal   Collection Time: 06/08/17  7:46 PM  Result Value Ref Range Status   Specimen Description URINE, CLEAN CATCH  Final   Special Requests Normal  Final   Culture >=100,000 COLONIES/mL STAPHYLOCOCCUS AUREUS (A)  Final   Report Status 06/11/2017 FINAL  Final   Organism ID, Bacteria STAPHYLOCOCCUS AUREUS (A)  Final      Susceptibility   Staphylococcus aureus - MIC*    CIPROFLOXACIN <=0.5 SENSITIVE Sensitive     GENTAMICIN <=0.5 SENSITIVE Sensitive     NITROFURANTOIN 32 SENSITIVE Sensitive     OXACILLIN <=0.25 SENSITIVE Sensitive     TETRACYCLINE <=1 SENSITIVE Sensitive     VANCOMYCIN 1 SENSITIVE Sensitive     TRIMETH/SULFA <=10 SENSITIVE Sensitive     CLINDAMYCIN <=0.25 SENSITIVE Sensitive     RIFAMPIN <=0.5 SENSITIVE Sensitive     Inducible Clindamycin  NEGATIVE Sensitive     * >=100,000 COLONIES/mL STAPHYLOCOCCUS AUREUS  Respiratory Panel by PCR     Status: None   Collection Time: 06/08/17  7:46 PM  Result Value Ref Range Status   Adenovirus NOT DETECTED NOT DETECTED Final   Coronavirus 229E NOT DETECTED NOT DETECTED Final   Coronavirus HKU1 NOT DETECTED NOT DETECTED Final   Coronavirus NL63 NOT DETECTED NOT DETECTED Final   Coronavirus OC43 NOT DETECTED NOT DETECTED Final   Metapneumovirus NOT DETECTED NOT DETECTED Final   Rhinovirus / Enterovirus NOT DETECTED NOT DETECTED Final   Influenza A NOT DETECTED NOT DETECTED Final   Influenza B NOT DETECTED NOT DETECTED Final   Parainfluenza Virus 1 NOT DETECTED NOT DETECTED Final   Parainfluenza Virus 2 NOT DETECTED NOT DETECTED Final   Parainfluenza Virus 3 NOT DETECTED NOT DETECTED Final   Parainfluenza Virus 4 NOT DETECTED NOT DETECTED Final   Respiratory Syncytial Virus NOT DETECTED NOT DETECTED Final   Bordetella pertussis NOT DETECTED NOT DETECTED Final   Chlamydophila pneumoniae NOT DETECTED NOT DETECTED Final   Mycoplasma pneumoniae NOT DETECTED NOT DETECTED Final  CSF culture     Status: None   Collection Time: 06/08/17 11:00 PM  Result Value Ref Range Status   Specimen Description CSF  Final   Special Requests Normal  Final   Culture NO GROWTH 3 DAYS  Final   Report Status 06/12/2017 FINAL  Final  Gram stain     Status: None   Collection Time: 06/08/17 11:00 PM  Result Value Ref Range Status   Specimen Description CSF  Final   Special Requests Normal  Final   Gram Stain   Final    CYTOSPIN SLIDE WBC PRESENT,BOTH PMN AND MONONUCLEAR NO ORGANISMS SEEN    Report Status 06/08/2017 FINAL  Final  Culture, blood (single)     Status: None (Preliminary result)   Collection Time: 06/08/17 11:30 PM  Result Value Ref Range Status   Specimen Description BLOOD LEFT FOREARM  Final   Special Requests   Final    BOTTLES DRAWN AEROBIC AND ANAEROBIC Blood Culture adequate volume    Culture NO GROWTH 3 DAYS  Final   Report Status PENDING  Incomplete  MRSA PCR Screening     Status: None   Collection Time: 06/09/17  3:15 AM  Result Value Ref Range Status   MRSA by PCR NEGATIVE NEGATIVE Final    Comment:        The GeneXpert MRSA Assay (FDA approved for NASAL specimens only), is one component of a comprehensive MRSA colonization surveillance program. It is not intended to diagnose MRSA infection nor to guide or monitor treatment for MRSA infections.   Body fluid culture     Status: None (Preliminary result)   Collection Time: 06/10/17  1:13 PM  Result Value Ref Range Status   Specimen Description SYNOVIAL LEFT KNEE  Final   Special Requests Normal  Final   Gram Stain   Final    FEW WBC PRESENT, PREDOMINANTLY PMN RARE GRAM POSITIVE COCCI IN PAIRS    Culture FEW STAPHYLOCOCCUS AUREUS  Final   Report Status PENDING  Incomplete   Organism ID, Bacteria STAPHYLOCOCCUS AUREUS  Final      Susceptibility   Staphylococcus aureus - MIC*    CIPROFLOXACIN <=0.5 SENSITIVE Sensitive     ERYTHROMYCIN <=0.25 SENSITIVE Sensitive     GENTAMICIN <=0.5 SENSITIVE Sensitive     OXACILLIN <=0.25 SENSITIVE Sensitive     TETRACYCLINE <=1 SENSITIVE Sensitive     VANCOMYCIN <=0.5 SENSITIVE Sensitive     TRIMETH/SULFA <=10 SENSITIVE Sensitive     CLINDAMYCIN <=0.25 SENSITIVE Sensitive     RIFAMPIN <=0.5 SENSITIVE Sensitive     Inducible Clindamycin NEGATIVE Sensitive     * FEW STAPHYLOCOCCUS AUREUS  Surgical PCR screen     Status: Abnormal   Collection Time: 06/11/17  1:18 PM  Result Value Ref Range Status   MRSA, PCR NEGATIVE NEGATIVE Final   Staphylococcus aureus POSITIVE (A) NEGATIVE Final    Comment: (NOTE) The Xpert SA Assay (FDA approved for NASAL specimens in patients 73 years of age and older), is one component of a comprehensive surveillance program. It is not intended to diagnose infection nor to guide or monitor treatment.   Aerobic/Anaerobic Culture  (surgical/deep wound)     Status: None (Preliminary result)   Collection Time: 06/11/17  3:21 PM  Result Value Ref Range Status   Specimen Description ABSCESS LEFT KNEE  Final   Special Requests NONE  Final   Gram Stain   Final    RARE WBC PRESENT, PREDOMINANTLY PMN RARE GRAM POSITIVE COCCI IN PAIRS    Culture   Final    FEW STAPHYLOCOCCUS AUREUS NO ANAEROBES ISOLATED; CULTURE IN PROGRESS FOR 5 DAYS    Report Status PENDING  Incomplete   Organism ID, Bacteria STAPHYLOCOCCUS AUREUS  Final      Susceptibility   Staphylococcus aureus - MIC*    CIPROFLOXACIN <=0.5 SENSITIVE Sensitive     ERYTHROMYCIN <=0.25 SENSITIVE Sensitive     GENTAMICIN <=0.5 SENSITIVE Sensitive     OXACILLIN <=0.25 SENSITIVE Sensitive     TETRACYCLINE <=1 SENSITIVE Sensitive     VANCOMYCIN <=0.5 SENSITIVE Sensitive     TRIMETH/SULFA <=10 SENSITIVE Sensitive     CLINDAMYCIN <=0.25 SENSITIVE Sensitive     RIFAMPIN <=0.5 SENSITIVE Sensitive     Inducible Clindamycin NEGATIVE Sensitive     * FEW STAPHYLOCOCCUS AUREUS    Cliffton Asters, MD [facility] [phone] pager   [phone] cell 06/13/2017, 11:31 AM

## 2017-06-13 NOTE — Progress Notes (Signed)
OT Cancellation Note  Patient Details Name: Marcus [name] MRN: [id number] DOB: 01/12/1966   Cancelled Treatment:    Reason Eval/Treat Not Completed: Patient declined, no reason specified; attempted x2. Pt initially sleeping soundly upon entering room, Mother present and reporting Pt had not slept much during the night. Pt very difficult to arouse, left Pt to rest. Second attempt made, Pt awake and increased alertness though adamantly refusing OT/PT eval at this time despite encouragement and multiple attempts. Declining bed level or EOB activity as well. Will follow up as schedule permits.  Marcy SirenBreanna Shaleena Crusoe, OT Pager [phone] 06/13/2017   Marcus [name] 06/13/2017, 3:10 PM

## 2017-06-13 NOTE — Progress Notes (Signed)
Upon further investigation, MSSA isolate is penicillin sensitive. Some other options that could be considered for treatment would be:  Oxacillin 2 gm every 4 hours- 12 gm/day Ampicillin 2 gm every 4 hours- 12 gm/day  Penicillin 4 million units every 4 hours- 24 million units/day   Sharin MonsEmily Emon Lance, PharmD, BCPS PGY2 Infectious Diseases Pharmacy Resident Pager: [phone]

## 2017-06-13 NOTE — Progress Notes (Signed)
PT Cancellation Note  Patient Details Name: Marcus ReapBryon T Grandison MRN: [id number] DOB: 10/18/1965   Cancelled Treatment:    Reason Eval/Treat Not Completed: Patient declined, no reason specified Attempted to perform co-evaluation with OT twice this afternoon- on first attempt, patient soundly asleep and could not be woken to participate with PT/OT; on second attempt patient awake but refusing to participate with PT/OT evaluations and appearing to start to become agitated with verbal encouragement to participate. Will attempt to return for evaluation as time allows.    Nedra HaiKristen Unger PT, DPT, CBIS  Supplemental Physical Therapist [facility]   Pager [phone]

## 2017-06-13 NOTE — Progress Notes (Addendum)
Patient is resting comfortably in bed.  He has had another bowel movement in his bed.  Left knee surgical dressing is intact.  HVAC with minimal drainage - which was removed.  Appreciate ID recs for treatment duration.  Patient states his left knee feels much better.  F/u in office in 2 weeks for suture removal.

## 2017-06-13 NOTE — Progress Notes (Signed)
PROGRESS NOTE    PERLIE SCHEURING  ZOX:[id number] DOB: [birth year] DOA: 06/08/2017 PCP: Patient, No Pcp Per  Brief Narrative: PCC on [facility] to [facility] on [facility] 1/10 Briefly Mr. Kloepfer is a 52 year old male with history of polysubstance abuse and hep C was found down by his significant other, subsequently brought to the emergency room, he was obtunded and found to have severe metabolic encephalopathy, infective endocarditis, MSSA bacteremia, septic emboli/embolic strokes and left knee septic arthritis. He is being followed by infectious disease and orthopedics, now on IV nafcillin, underwent left knee I & D on 1/9   Assessment & Plan:   Severe sepsis /MSSA endocarditis complicated by septic embolic strokes and left knee septic arthritis -History of polysubstance abuse however no clear history obtained to suggest IV drug use - repeat blood cultures today -2D ECHO with mitral valve vegetation -ID following -will likely need atleast 6 weeks of IV Nafcillin  -PT/OT eval -CSW consult, will likely need rehab  Left Knee Septic Arthritis -Culture is growing few staph -s/p incision and debridement on 1/9 by Dr. 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 -With resultant left-sided weakness, left field cut, some dysarthria and dysphagia -Treatment for this is antibiotics for infective endocarditis, continue nafcillin -Will request physical, occupational and speech therapy evaluation -Appreciate neurology input recommended follow-up with [facility] neurology in 4-6 weeks  Hepatitis C antibody positive -Checking hep C genotype and viral load -Follow-up with ID after acute illness is resolved  Polysubstance abuse -Per family history of substance abuse but no known history of IV drug use as far as they know -UDS positive for cocaine, opiates and benzos on admission  Acute kidney injury -Likely secondary to sepsis, creatinine was 3.0 on admission now down to 1.4 -Baseline  unknown -Kidney function is improving, cut down IV fluids -Monitor  Thrombocytopenia -Baseline unknown -Likely worsened in the setting of sepsis -improving  DVT prophylaxis: SCDs, due to low platelets, start tomorrow Code Status:  full code  Family Communication: son at bedside  Disposition Plan: SNF early next week  Consultants:   ID  PCCM  Ortho   neurology  Procedures: PROCEDURE: Arthrotomy of left knee and irrigation and debridement for septic arthritis 1/9   2-D echocardiogram   Antimicrobials:  Antibiotics Given (last 72 hours)    Date/Time Action Medication Dose Rate   06/10/17 1445 New Bag/Given   nafcillin 2 g in dextrose 5 % 100 mL IVPB 2 g 200 mL/hr   06/10/17 1738 New Bag/Given   nafcillin 2 g in dextrose 5 % 100 mL IVPB 2 g 200 mL/hr   06/10/17 2207 New Bag/Given   nafcillin 2 g in dextrose 5 % 100 mL IVPB 2 g 200 mL/hr   06/11/17 0200 New Bag/Given   nafcillin 2 g in dextrose 5 % 100 mL IVPB 2 g 200 mL/hr   06/11/17 0603 New Bag/Given   nafcillin 2 g in dextrose 5 % 100 mL IVPB 2 g 200 mL/hr   06/11/17 0910 New Bag/Given   nafcillin 2 g in dextrose 5 % 100 mL IVPB 2 g 200 mL/hr   06/11/17 1337 New Bag/Given   nafcillin 2 g in dextrose 5 % 100 mL IVPB 2 g 200 mL/hr   06/11/17 1738 New Bag/Given   nafcillin 2 g in dextrose 5 % 100 mL IVPB 2 g 200 mL/hr   06/11/17 2226 New Bag/Given   nafcillin 2 g in dextrose 5 % 100 mL IVPB 2 g  200 mL/hr   06/12/17 0111 New Bag/Given   nafcillin 2 g in dextrose 5 % 100 mL IVPB 2 g 200 mL/hr   06/12/17 0506 New Bag/Given   nafcillin 2 g in dextrose 5 % 100 mL IVPB 2 g 200 mL/hr   06/12/17 1203 New Bag/Given   nafcillin 2 g in dextrose 5 % 100 mL IVPB 2 g 200 mL/hr   06/12/17 1501 New Bag/Given   nafcillin 2 g in dextrose 5 % 100 mL IVPB 2 g 200 mL/hr   06/12/17 1716 New Bag/Given   nafcillin 2 g in dextrose 5 % 100 mL IVPB 2 g 200 mL/hr   06/12/17 2220 New Bag/Given   nafcillin 2 g in dextrose 5 % 100 mL IVPB 2  g 200 mL/hr   06/13/17 0224 New Bag/Given   nafcillin 2 g in dextrose 5 % 100 mL IVPB 2 g 200 mL/hr   06/13/17 [id number] New Bag/Given   nafcillin 2 g in dextrose 5 % 100 mL IVPB 2 g 200 mL/hr   06/13/17 [id number] New Bag/Given   nafcillin 2 g in dextrose 5 % 100 mL IVPB 2 g 200 mL/hr      Subjective: -Has not been able to get up and around much, no issues overnight  Objective: Vitals:   06/12/17 2048 06/13/17 0438 06/13/17 0438 06/13/17 0449  BP: (!) 117/56 114/70 114/70   Pulse: (!) 105 (!) 101 (!) 101   Resp: 17 18 18    Temp: 99.4 F (37.4 C) 98.6 F (37 C) 98.6 F (37 C)   TempSrc: Oral Oral Oral   SpO2: 98% 99% 99%   Weight:    83.9 kg (184 lb 15.5 oz)  Height:        Intake/Output Summary (Last 24 hours) at 06/13/2017 1027 Last data filed at 06/13/2017 0600 Gross per 24 hour  Intake 640 ml  Output 720 ml  Net -80 ml   Filed Weights   06/12/17 0427 06/12/17 1841 06/13/17 0449  Weight: 77.6 kg (171 lb 1.2 oz) 81.5 kg (179 lb 9.6 oz) 83.9 kg (184 lb 15.5 oz)    Examination: Gen: Awake, Alert, Oriented X  Frail, chronically ill-appearing male more alert today  HEENT: PERRLA, Neck supple, no JVD Lungs: Good air movement bilaterally, CTAB CVS: RRR, Abd: soft, Non tender, non distended, BS present Extremities: Trace edema, left knee with dressing  Skin: no new rashes Neuro : Left upper extremity mostly flaccid left lower leg poor effort -unable to assess strength there  Right side is 4/5     Data Reviewed:   CBC: Recent Labs  Lab 06/08/17 1746 06/08/17 1757 06/09/17 0719 06/10/17 0451 06/13/17 0140  WBC 33.5*  --  20.6* 11.8* 13.1*  NEUTROABS 32.2*  --   --   --   --   HGB 16.4 17.7* 11.5* 10.0* 8.9*  HCT 46.9 52.0 33.2* 29.6* 26.3*  MCV 82.4  --  81.6 81.3 82.2  PLT 134*  --  75* 55* 142*   Basic Metabolic Panel: Recent Labs  Lab 06/08/17 2337 06/09/17 0719 06/10/17 0451 06/10/17 1249 06/13/17 0140  NA 129* 131* 129* 130* 132*  K 3.8 3.6 2.7* 3.1*  2.2*  CL 96* 98* 100* 101 102  CO2 18* 20* 17* 20* 21*  GLUCOSE 140* 131* 117* 108* 159*  BUN 84* 82* 68* 58* 23*  CREATININE 2.94* 2.65* 2.01* 1.89* 1.40*  CALCIUM 6.6* 6.7* 6.3* 6.8* 6.7*  MG  --  2.4 2.2  --   --  PHOS  --  6.5* 4.1  --   --    GFR: Estimated Creatinine Clearance: 66.5 mL/min (A) (by C-G formula based on SCr of 1.4 mg/dL (H)). Liver Function Tests: Recent Labs  Lab 06/08/17 1746 06/08/17 2337 06/09/17 0719  AST 187* 205* 204*  ALT 107* 98* 101*  ALKPHOS 190* 105 92  BILITOT 2.2* 1.4* 1.1  PROT 7.4 5.1* 4.7*  ALBUMIN 2.3* 1.6* 1.4*   Recent Labs  Lab 06/08/17 1746  LIPASE 19   No results for input(s): AMMONIA in the last 168 hours. Coagulation Profile: Recent Labs  Lab 06/08/17 1746  INR 1.49   Cardiac Enzymes: Recent Labs  Lab 06/08/17 1746 06/08/17 2337 06/09/17 0719 06/09/17 1224 06/09/17 1832  CKTOTAL 791*  --  6,435*  --   --   TROPONINI  --  0.36* 0.29* 0.22* 0.24*   BNP (last 3 results) No results for input(s): PROBNP in the last 8760 hours. HbA1C: No results for input(s): HGBA1C in the last 72 hours. CBG: Recent Labs  Lab 06/12/17 1544 06/12/17 2046 06/13/17 0040 06/13/17 0438 06/13/17 0817  GLUCAP 132* 104* 136* 119* 98   Lipid Profile: No results for input(s): CHOL, HDL, LDLCALC, TRIG, CHOLHDL, LDLDIRECT in the last 72 hours. Thyroid Function Tests: No results for input(s): TSH, T4TOTAL, FREET4, T3FREE, THYROIDAB in the last 72 hours. Anemia Panel: No results for input(s): VITAMINB12, FOLATE, FERRITIN, TIBC, IRON, RETICCTPCT in the last 72 hours. Urine analysis:    Component Value Date/Time   COLORURINE AMBER (A) 06/08/2017 1724   APPEARANCEUR CLOUDY (A) 06/08/2017 1724   LABSPEC 1.016 06/08/2017 1724   PHURINE 5.0 06/08/2017 1724   GLUCOSEU NEGATIVE 06/08/2017 1724   HGBUR LARGE (A) 06/08/2017 1724   BILIRUBINUR NEGATIVE 06/08/2017 1724   KETONESUR NEGATIVE 06/08/2017 1724   PROTEINUR NEGATIVE 06/08/2017  1724   NITRITE NEGATIVE 06/08/2017 1724   LEUKOCYTESUR NEGATIVE 06/08/2017 1724   Sepsis Labs: @LABRCNTIP (procalcitonin:4,lacticidven:4)  ) Recent Results (from the past 240 hour(s))  Culture, blood (routine x 2)     Status: Abnormal   Collection Time: 06/08/17  5:30 PM  Result Value Ref Range Status   Specimen Description BLOOD LEFT ARM  Final   Special Requests IN PEDIATRIC BOTTLE Blood Culture adequate volume  Final   Culture  Setup Time   Final    GRAM POSITIVE COCCI IN CLUSTERS IN PEDIATRIC BOTTLE CRITICAL VALUE NOTED.  VALUE IS CONSISTENT WITH PREVIOUSLY REPORTED AND CALLED VALUE.    Culture (A)  Final    STAPHYLOCOCCUS AUREUS SUSCEPTIBILITIES PERFORMED ON PREVIOUS CULTURE WITHIN THE LAST 5 DAYS.    Report Status 06/11/2017 FINAL  Final  Culture, blood (routine x 2)     Status: Abnormal   Collection Time: 06/08/17  5:46 PM  Result Value Ref Range Status   Specimen Description BLOOD RIGHT ANTECUBITAL  Final   Special Requests   Final    BOTTLES DRAWN AEROBIC AND ANAEROBIC Blood Culture adequate volume   Culture  Setup Time   Final    IN BOTH AEROBIC AND ANAEROBIC BOTTLES GRAM POSITIVE COCCI IN CLUSTERS CRITICAL RESULT CALLED TO, READ BACK BY AND VERIFIED WITH: J.LEDFORD,PHARMD [id number] 06/09/17 M.CAMPBELL    Culture STAPHYLOCOCCUS AUREUS (A)  Final   Report Status 06/11/2017 FINAL  Final   Organism ID, Bacteria STAPHYLOCOCCUS AUREUS  Final      Susceptibility   Staphylococcus aureus - MIC*    CIPROFLOXACIN <=0.5 SENSITIVE Sensitive     ERYTHROMYCIN <=0.25 SENSITIVE Sensitive  GENTAMICIN <=0.5 SENSITIVE Sensitive     OXACILLIN <=0.25 SENSITIVE Sensitive     TETRACYCLINE <=1 SENSITIVE Sensitive     VANCOMYCIN <=0.5 SENSITIVE Sensitive     TRIMETH/SULFA <=10 SENSITIVE Sensitive     CLINDAMYCIN <=0.25 SENSITIVE Sensitive     RIFAMPIN <=0.5 SENSITIVE Sensitive     Inducible Clindamycin NEGATIVE Sensitive     * STAPHYLOCOCCUS AUREUS  Blood Culture ID Panel (Reflexed)      Status: Abnormal   Collection Time: 06/08/17  5:46 PM  Result Value Ref Range Status   Enterococcus species NOT DETECTED NOT DETECTED Final   Listeria monocytogenes NOT DETECTED NOT DETECTED Final   Staphylococcus species DETECTED (A) NOT DETECTED Final    Comment: CRITICAL RESULT CALLED TO, READ BACK BY AND VERIFIED WITH: J.LEDFORD,PHARMD 1610 06/09/17 M.CAMPBELL    Staphylococcus aureus DETECTED (A) NOT DETECTED Final    Comment: Methicillin (oxacillin) susceptible Staphylococcus aureus (MSSA). Preferred therapy is anti staphylococcal beta lactam antibiotic (Cefazolin or Nafcillin), unless clinically contraindicated. CRITICAL RESULT CALLED TO, READ BACK BY AND VERIFIED WITH: J.LEDFORD,PHARMD [id number] 06/09/17 M.CAMPBELL    Methicillin resistance NOT DETECTED NOT DETECTED Final   Streptococcus species NOT DETECTED NOT DETECTED Final   Streptococcus agalactiae NOT DETECTED NOT DETECTED Final   Streptococcus pneumoniae NOT DETECTED NOT DETECTED Final   Streptococcus pyogenes NOT DETECTED NOT DETECTED Final   Acinetobacter baumannii NOT DETECTED NOT DETECTED Final   Enterobacteriaceae species NOT DETECTED NOT DETECTED Final   Enterobacter cloacae complex NOT DETECTED NOT DETECTED Final   Escherichia coli NOT DETECTED NOT DETECTED Final   Klebsiella oxytoca NOT DETECTED NOT DETECTED Final   Klebsiella pneumoniae NOT DETECTED NOT DETECTED Final   Proteus species NOT DETECTED NOT DETECTED Final   Serratia marcescens NOT DETECTED NOT DETECTED Final   Haemophilus influenzae NOT DETECTED NOT DETECTED Final   Neisseria meningitidis NOT DETECTED NOT DETECTED Final   Pseudomonas aeruginosa NOT DETECTED NOT DETECTED Final   Candida albicans NOT DETECTED NOT DETECTED Final   Candida glabrata NOT DETECTED NOT DETECTED Final   Candida krusei NOT DETECTED NOT DETECTED Final   Candida parapsilosis NOT DETECTED NOT DETECTED Final   Candida tropicalis NOT DETECTED NOT DETECTED Final  Urine culture      Status: Abnormal   Collection Time: 06/08/17  7:46 PM  Result Value Ref Range Status   Specimen Description URINE, CLEAN CATCH  Final   Special Requests Normal  Final   Culture >=100,000 COLONIES/mL STAPHYLOCOCCUS AUREUS (A)  Final   Report Status 06/11/2017 FINAL  Final   Organism ID, Bacteria STAPHYLOCOCCUS AUREUS (A)  Final      Susceptibility   Staphylococcus aureus - MIC*    CIPROFLOXACIN <=0.5 SENSITIVE Sensitive     GENTAMICIN <=0.5 SENSITIVE Sensitive     NITROFURANTOIN 32 SENSITIVE Sensitive     OXACILLIN <=0.25 SENSITIVE Sensitive     TETRACYCLINE <=1 SENSITIVE Sensitive     VANCOMYCIN 1 SENSITIVE Sensitive     TRIMETH/SULFA <=10 SENSITIVE Sensitive     CLINDAMYCIN <=0.25 SENSITIVE Sensitive     RIFAMPIN <=0.5 SENSITIVE Sensitive     Inducible Clindamycin NEGATIVE Sensitive     * >=100,000 COLONIES/mL STAPHYLOCOCCUS AUREUS  Respiratory Panel by PCR     Status: None   Collection Time: 06/08/17  7:46 PM  Result Value Ref Range Status   Adenovirus NOT DETECTED NOT DETECTED Final   Coronavirus 229E NOT DETECTED NOT DETECTED Final   Coronavirus HKU1 NOT DETECTED NOT DETECTED Final   Coronavirus  NL63 NOT DETECTED NOT DETECTED Final   Coronavirus OC43 NOT DETECTED NOT DETECTED Final   Metapneumovirus NOT DETECTED NOT DETECTED Final   Rhinovirus / Enterovirus NOT DETECTED NOT DETECTED Final   Influenza A NOT DETECTED NOT DETECTED Final   Influenza B NOT DETECTED NOT DETECTED Final   Parainfluenza Virus 1 NOT DETECTED NOT DETECTED Final   Parainfluenza Virus 2 NOT DETECTED NOT DETECTED Final   Parainfluenza Virus 3 NOT DETECTED NOT DETECTED Final   Parainfluenza Virus 4 NOT DETECTED NOT DETECTED Final   Respiratory Syncytial Virus NOT DETECTED NOT DETECTED Final   Bordetella pertussis NOT DETECTED NOT DETECTED Final   Chlamydophila pneumoniae NOT DETECTED NOT DETECTED Final   Mycoplasma pneumoniae NOT DETECTED NOT DETECTED Final  CSF culture     Status: None   Collection  Time: 06/08/17 11:00 PM  Result Value Ref Range Status   Specimen Description CSF  Final   Special Requests Normal  Final   Culture NO GROWTH 3 DAYS  Final   Report Status 06/12/2017 FINAL  Final  Gram stain     Status: None   Collection Time: 06/08/17 11:00 PM  Result Value Ref Range Status   Specimen Description CSF  Final   Special Requests Normal  Final   Gram Stain   Final    CYTOSPIN SLIDE WBC PRESENT,BOTH PMN AND MONONUCLEAR NO ORGANISMS SEEN    Report Status 06/08/2017 FINAL  Final  Culture, blood (single)     Status: None (Preliminary result)   Collection Time: 06/08/17 11:30 PM  Result Value Ref Range Status   Specimen Description BLOOD LEFT FOREARM  Final   Special Requests   Final    BOTTLES DRAWN AEROBIC AND ANAEROBIC Blood Culture adequate volume   Culture NO GROWTH 3 DAYS  Final   Report Status PENDING  Incomplete  MRSA PCR Screening     Status: None   Collection Time: 06/09/17  3:15 AM  Result Value Ref Range Status   MRSA by PCR NEGATIVE NEGATIVE Final    Comment:        The GeneXpert MRSA Assay (FDA approved for NASAL specimens only), is one component of a comprehensive MRSA colonization surveillance program. It is not intended to diagnose MRSA infection nor to guide or monitor treatment for MRSA infections.   Body fluid culture     Status: None (Preliminary result)   Collection Time: 06/10/17  1:13 PM  Result Value Ref Range Status   Specimen Description SYNOVIAL LEFT KNEE  Final   Special Requests Normal  Final   Gram Stain   Final    FEW WBC PRESENT, PREDOMINANTLY PMN RARE GRAM POSITIVE COCCI IN PAIRS    Culture   Final    FEW STAPHYLOCOCCUS AUREUS SUSCEPTIBILITIES TO FOLLOW    Report Status PENDING  Incomplete  Surgical PCR screen     Status: Abnormal   Collection Time: 06/11/17  1:18 PM  Result Value Ref Range Status   MRSA, PCR NEGATIVE NEGATIVE Final   Staphylococcus aureus POSITIVE (A) NEGATIVE Final    Comment: (NOTE) The Xpert SA  Assay (FDA approved for NASAL specimens in patients 1 years of age and older), is one component of a comprehensive surveillance program. It is not intended to diagnose infection nor to guide or monitor treatment.   Aerobic/Anaerobic Culture (surgical/deep wound)     Status: None (Preliminary result)   Collection Time: 06/11/17  3:21 PM  Result Value Ref Range Status   Specimen Description ABSCESS  LEFT KNEE  Final   Special Requests NONE  Final   Gram Stain   Final    RARE WBC PRESENT, PREDOMINANTLY PMN RARE GRAM POSITIVE COCCI IN PAIRS    Culture   Final    FEW STAPHYLOCOCCUS AUREUS SUSCEPTIBILITIES TO FOLLOW    Report Status PENDING  Incomplete         Radiology Studies: No results found.      Scheduled Meds: . pantoprazole  40 mg Oral Q1200  . potassium chloride  40 mEq Oral Q2H  . thiamine  100 mg Oral Daily   Continuous Infusions: . sodium chloride    . sodium chloride 75 mL/hr at 06/13/17 0743  . nafcillin IV 2 g (06/13/17 1610)  . potassium chloride 10 mEq (06/13/17 0938)     LOS: 4 days    Time spent:    Zannie Cove, MD [facility] Page via [email or web address], password [email or web address] After 7PM please contact night-coverage  06/13/2017, 10:27 AM

## 2017-06-14 LAB — GLUCOSE, CAPILLARY
GLUCOSE-CAPILLARY: 126 mg/dL — AB (ref 65–99)
GLUCOSE-CAPILLARY: 158 mg/dL — AB (ref 65–99)
GLUCOSE-CAPILLARY: 89 mg/dL (ref 65–99)
Glucose-Capillary: 112 mg/dL — ABNORMAL HIGH (ref 65–99)
Glucose-Capillary: 113 mg/dL — ABNORMAL HIGH (ref 65–99)
Glucose-Capillary: 108 mg/dL — ABNORMAL HIGH (ref 65–99)

## 2017-06-14 LAB — BASIC METABOLIC PANEL
Anion gap: 7 (ref 5–15)
BUN: 15 mg/dL (ref 6–20)
CALCIUM: 6.8 mg/dL — AB (ref 8.9–10.3)
CO2: 19 mmol/L — ABNORMAL LOW (ref 22–32)
Chloride: 104 mmol/L (ref 101–111)
Creatinine, Ser: 1.22 mg/dL (ref 0.61–1.24)
GFR calc Af Amer: >60 mL/min (ref >60)
GLUCOSE: 117 mg/dL — AB (ref 65–99)
POTASSIUM: 3.1 mmol/L — AB (ref 3.5–5.1)
Sodium: 130 mmol/L — ABNORMAL LOW (ref 135–145)

## 2017-06-14 LAB — CULTURE, BLOOD (SINGLE)
Culture: NO GROWTH
Special Requests: ADEQUATE

## 2017-06-14 MED ORDER — ENOXAPARIN SODIUM 30 MG/0.3ML ~~LOC~~ SOLN
30.0000 mg | SUBCUTANEOUS | Status: DC
  Administered 2017-06-14: 30 mg via SUBCUTANEOUS
  Filled 2017-06-14: qty 0.3

## 2017-06-14 MED ORDER — MORPHINE SULFATE (PF) 4 MG/ML IV SOLN
4.0000 mg | INTRAVENOUS | Status: DC | PRN
  Administered 2017-06-15: 4 mg via INTRAVENOUS
  Filled 2017-06-14: qty 1

## 2017-06-14 MED ORDER — OXYCODONE HCL 5 MG PO TABS
5.0000 mg | ORAL_TABLET | ORAL | Status: AC | PRN
  Administered 2017-06-15 (×3): 5 mg via ORAL
  Filled 2017-06-14 (×3): qty 1

## 2017-06-14 MED ORDER — POLYETHYLENE GLYCOL 3350 17 G PO PACK
17.0000 g | PACK | Freq: Every day | ORAL | Status: DC | PRN
  Administered 2017-06-15: 17 g via ORAL
  Filled 2017-06-14: qty 1

## 2017-06-14 MED ORDER — MORPHINE SULFATE (PF) 4 MG/ML IV SOLN
4.0000 mg | Freq: Once | INTRAVENOUS | Status: AC
  Administered 2017-06-14: 4 mg via INTRAVENOUS
  Filled 2017-06-14: qty 1

## 2017-06-14 MED ORDER — SENNOSIDES-DOCUSATE SODIUM 8.6-50 MG PO TABS: 1.0000 | ORAL_TABLET | Freq: Every evening | ORAL | Status: DC | PRN

## 2017-06-14 MED ORDER — POTASSIUM CHLORIDE CRYS ER 20 MEQ PO TBCR
40.0000 meq | EXTENDED_RELEASE_TABLET | Freq: Two times a day (BID) | ORAL | Status: AC
  Administered 2017-06-14 (×2): 40 meq via ORAL
  Filled 2017-06-14 (×2): qty 2

## 2017-06-14 MED ORDER — MORPHINE SULFATE (PF) 2 MG/ML IV SOLN
2.0000 mg | Freq: Once | INTRAVENOUS | Status: AC
  Administered 2017-06-14: 2 mg via INTRAVENOUS
  Filled 2017-06-14: qty 1

## 2017-06-14 NOTE — Progress Notes (Signed)
Pt states pain stays at 9/10 all over body despite pain medication given. He states that the best it has gotten is 8/10. Tachycardic at rest, HR 118. Notified Bodenheimer, NP. Will continue to monitor and assess.

## 2017-06-14 NOTE — Progress Notes (Signed)
PROGRESS NOTE    Marcus [name]  OZH:[id number] DOB: 01/10/1966 DOA: 06/08/2017 PCP: Patient, No Pcp Per  Brief Narrative: PCC on [facility] 1/10 Briefly Marcus [name] is a 52 year old male with history of polysubstance abuse and hep C was found down by his significant other, subsequently brought to the emergency room, he was obtunded and found to have severe metabolic encephalopathy, infective endocarditis, MSSA bacteremia, septic emboli/embolic strokes and left knee septic arthritis. He is being followed by infectious disease and orthopedics, now on IV nafcillin, underwent left knee I & D on 1/9   Assessment & Plan:   Severe sepsis /MSSA endocarditis complicated by septic embolic strokes and left knee septic arthritis -History of polysubstance abuse however no clear history obtained to suggest IV drug use -Repeat blood cultures from 1/9 remain positive, will recheck blood cultures again -2D ECHO with mitral valve vegetation -ID following -will likely need atleast 6 weeks of IV Nafcillin  -PT/OT eval-consulted however evaluation still pending due to patient's refusal -CSW consulted, will likely need rehab  Left Knee Septic Arthritis -Culture is growing few staph -s/p incision and debridement on 1/9 by Dr. 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 -With resultant left-sided weakness, left field cut, some dysarthria and dysphagia -Treatment for this is antibiotics for infective endocarditis, continue nafcillin -Will request physical, occupational eval -speech therapy evaluation completed, dysphagia 1 diet recommended at this time -Appreciate neurology input recommended follow-up with [facility] neurology in 4-6 weeks  Hepatitis C antibody positive -Checking hep C genotype and viral load -Follow-up with ID after acute illness is resolved  Polysubstance abuse -Per family history of substance abuse but no known history of IV drug use as far as  they know -UDS positive for cocaine, opiates and benzos on admission  Acute kidney injury -Likely secondary to sepsis, creatinine was 3.0 on admission now down to 1.4 -Baseline unknown -Kidney function is improving, cut down IV fluids -Monitor  Thrombocytopenia -Baseline unknown -Likely worsened in the setting of sepsis -improving  DVT prophylaxis: start lovenox Code Status:  full code  Family Communication: son at bedside  Disposition Plan: SNF early next week  Consultants:   ID  PCCM  Ortho   neurology  Procedures: PROCEDURE: Arthrotomy of left knee and irrigation and debridement for septic arthritis 1/9   2-D echocardiogram   Antimicrobials:  Antibiotics Given (last 72 hours)    Date/Time Action Medication Dose Rate   06/11/17 1337 New Bag/Given   nafcillin 2 g in dextrose 5 % 100 mL IVPB 2 g 200 mL/hr   06/11/17 1738 New Bag/Given   nafcillin 2 g in dextrose 5 % 100 mL IVPB 2 g 200 mL/hr   06/11/17 2226 New Bag/Given   nafcillin 2 g in dextrose 5 % 100 mL IVPB 2 g 200 mL/hr   06/12/17 0111 New Bag/Given   nafcillin 2 g in dextrose 5 % 100 mL IVPB 2 g 200 mL/hr   06/12/17 0506 New Bag/Given   nafcillin 2 g in dextrose 5 % 100 mL IVPB 2 g 200 mL/hr   06/12/17 1203 New Bag/Given   nafcillin 2 g in dextrose 5 % 100 mL IVPB 2 g 200 mL/hr   06/12/17 1501 New Bag/Given   nafcillin 2 g in dextrose 5 % 100 mL IVPB 2 g 200 mL/hr   06/12/17 1716 New Bag/Given   nafcillin 2 g in dextrose 5 % 100 mL IVPB 2 g 200 mL/hr  06/12/17 2220 New Bag/Given   nafcillin 2 g in dextrose 5 % 100 mL IVPB 2 g 200 mL/hr   06/13/17 0224 New Bag/Given   nafcillin 2 g in dextrose 5 % 100 mL IVPB 2 g 200 mL/hr   06/13/17 1610 New Bag/Given   nafcillin 2 g in dextrose 5 % 100 mL IVPB 2 g 200 mL/hr   06/13/17 9604 New Bag/Given   nafcillin 2 g in dextrose 5 % 100 mL IVPB 2 g 200 mL/hr   06/13/17 1459 New Bag/Given   nafcillin 2 g in dextrose 5 % 100 mL IVPB 2 g 200 mL/hr   06/13/17  1848 New Bag/Given   nafcillin 2 g in dextrose 5 % 100 mL IVPB 2 g 200 mL/hr   06/13/17 2227 New Bag/Given   nafcillin 2 g in dextrose 5 % 100 mL IVPB 2 g 200 mL/hr   06/14/17 0307 New Bag/Given   nafcillin 2 g in dextrose 5 % 100 mL IVPB 2 g 200 mL/hr   06/14/17 0535 New Bag/Given   nafcillin 2 g in dextrose 5 % 100 mL IVPB 2 g 200 mL/hr   06/14/17 1034 New Bag/Given   nafcillin 2 g in dextrose 5 % 100 mL IVPB 2 g 200 mL/hr      Subjective: -Declined physical therapy evaluation yesterday, -Continues to interact minimally  Objective: Vitals:   06/13/17 0449 06/13/17 1406 06/14/17 0009 06/14/17 0500  BP:  (!) 145/68 132/63 129/82  Pulse:  (!) 102 (!) 112 100  Resp:  18 18 17   Temp:  99.7 F (37.6 C) 100.1 F (37.8 C) 100.2 F (37.9 C)  TempSrc:  Oral Oral Oral  SpO2:  100% 97% 99%  Weight: 83.9 kg (184 lb 15.5 oz)   85.3 kg (188 lb 0.8 oz)  Height:        Intake/Output Summary (Last 24 hours) at 06/14/2017 1232 Last data filed at 06/14/2017 0925 Gross per 24 hour  Intake 5481.25 ml  Output 2550 ml  Net 2931.25 ml   Filed Weights   06/12/17 1841 06/13/17 0449 06/14/17 0500  Weight: 81.5 kg (179 lb 9.6 oz) 83.9 kg (184 lb 15.5 oz) 85.3 kg (188 lb 0.8 oz)    Examination: Gen: Awake, Alert, Oriented X 3, frail, chronically ill appearing male alert and awake however says yes and no only HEENT: No JVD Lungs: Decreased breath sounds both bases CVS: RRR, no murmur appreciated Abd: soft, Non tender, non distended, BS present Extremities: Left upper extremity profound weakness, left leg/knee with dressing and distal edema Skin: no new rashes     Data Reviewed:   CBC: Recent Labs  Lab 06/08/17 1746 06/08/17 1757 06/09/17 0719 06/10/17 0451 06/13/17 0140  WBC 33.5*  --  20.6* 11.8* 13.1*  NEUTROABS 32.2*  --   --   --   --   HGB 16.4 17.7* 11.5* 10.0* 8.9*  HCT 46.9 52.0 33.2* 29.6* 26.3*  MCV 82.4  --  81.6 81.3 82.2  PLT 134*  --  75* 55* 142*   Basic  Metabolic Panel: Recent Labs  Lab 06/09/17 0719 06/10/17 0451 06/10/17 1249 06/13/17 0140 06/13/17 0834 06/14/17 0752  NA 131* 129* 130* 132*  --  130*  K 3.6 2.7* 3.1* 2.2*  --  3.1*  CL 98* 100* 101 102  --  104  CO2 20* 17* 20* 21*  --  19*  GLUCOSE 131* 117* 108* 159*  --  117*  BUN 82* 68*  58* 23*  --  15  CREATININE 2.65* 2.01* 1.89* 1.40*  --  1.22  CALCIUM 6.7* 6.3* 6.8* 6.7*  --  6.8*  MG 2.4 2.2  --   --  1.7  --   PHOS 6.5* 4.1  --   --   --   --    GFR: Estimated Creatinine Clearance: 76.3 mL/min (by C-G formula based on SCr of 1.22 mg/dL). Liver Function Tests: Recent Labs  Lab 06/08/17 1746 06/08/17 2337 06/09/17 0719  AST 187* 205* 204*  ALT 107* 98* 101*  ALKPHOS 190* 105 92  BILITOT 2.2* 1.4* 1.1  PROT 7.4 5.1* 4.7*  ALBUMIN 2.3* 1.6* 1.4*   Recent Labs  Lab 06/08/17 1746  LIPASE 19   No results for input(s): AMMONIA in the last 168 hours. Coagulation Profile: Recent Labs  Lab 06/08/17 1746  INR 1.49   Cardiac Enzymes: Recent Labs  Lab 06/08/17 1746 06/08/17 2337 06/09/17 0719 06/09/17 1224 06/09/17 1832  CKTOTAL 791*  --  6,435*  --   --   TROPONINI  --  0.36* 0.29* 0.22* 0.24*   BNP (last 3 results) No results for input(s): PROBNP in the last 8760 hours. HbA1C: No results for input(s): HGBA1C in the last 72 hours. CBG: Recent Labs  Lab 06/13/17 2030 06/14/17 0005 06/14/17 0459 06/14/17 0735 06/14/17 1221  GLUCAP 151* 158* 126* 112* 113*   Lipid Profile: No results for input(s): CHOL, HDL, LDLCALC, TRIG, CHOLHDL, LDLDIRECT in the last 72 hours. Thyroid Function Tests: No results for input(s): TSH, T4TOTAL, FREET4, T3FREE, THYROIDAB in the last 72 hours. Anemia Panel: No results for input(s): VITAMINB12, FOLATE, FERRITIN, TIBC, IRON, RETICCTPCT in the last 72 hours. Urine analysis:    Component Value Date/Time   COLORURINE AMBER (A) 06/08/2017 1724   APPEARANCEUR CLOUDY (A) 06/08/2017 1724   LABSPEC 1.016 06/08/2017  1724   PHURINE 5.0 06/08/2017 1724   GLUCOSEU NEGATIVE 06/08/2017 1724   HGBUR LARGE (A) 06/08/2017 1724   BILIRUBINUR NEGATIVE 06/08/2017 1724   KETONESUR NEGATIVE 06/08/2017 1724   PROTEINUR NEGATIVE 06/08/2017 1724   NITRITE NEGATIVE 06/08/2017 1724   LEUKOCYTESUR NEGATIVE 06/08/2017 1724   Sepsis Labs: @LABRCNTIP (procalcitonin:4,lacticidven:4)  ) Recent Results (from the past 240 hour(s))  Culture, blood (routine x 2)     Status: Abnormal   Collection Time: 06/08/17  5:30 PM  Result Value Ref Range Status   Specimen Description BLOOD LEFT ARM  Final   Special Requests IN PEDIATRIC BOTTLE Blood Culture adequate volume  Final   Culture  Setup Time   Final    GRAM POSITIVE COCCI IN CLUSTERS IN PEDIATRIC BOTTLE CRITICAL VALUE NOTED.  VALUE IS CONSISTENT WITH PREVIOUSLY REPORTED AND CALLED VALUE.    Culture (A)  Final    STAPHYLOCOCCUS AUREUS SUSCEPTIBILITIES PERFORMED ON PREVIOUS CULTURE WITHIN THE LAST 5 DAYS.    Report Status 06/11/2017 FINAL  Final  Culture, blood (routine x 2)     Status: Abnormal   Collection Time: 06/08/17  5:46 PM  Result Value Ref Range Status   Specimen Description BLOOD RIGHT ANTECUBITAL  Final   Special Requests   Final    BOTTLES DRAWN AEROBIC AND ANAEROBIC Blood Culture adequate volume   Culture  Setup Time   Final    IN BOTH AEROBIC AND ANAEROBIC BOTTLES GRAM POSITIVE COCCI IN CLUSTERS CRITICAL RESULT CALLED TO, READ BACK BY AND VERIFIED WITH: J.LEDFORD,PHARMD [id number] 06/09/17 M.CAMPBELL    Culture STAPHYLOCOCCUS AUREUS (A)  Final   Report Status  06/11/2017 FINAL  Final   Organism ID, Bacteria STAPHYLOCOCCUS AUREUS  Final      Susceptibility   Staphylococcus aureus - MIC*    CIPROFLOXACIN <=0.5 SENSITIVE Sensitive     ERYTHROMYCIN <=0.25 SENSITIVE Sensitive     GENTAMICIN <=0.5 SENSITIVE Sensitive     OXACILLIN <=0.25 SENSITIVE Sensitive     TETRACYCLINE <=1 SENSITIVE Sensitive     VANCOMYCIN <=0.5 SENSITIVE Sensitive     TRIMETH/SULFA  <=10 SENSITIVE Sensitive     CLINDAMYCIN <=0.25 SENSITIVE Sensitive     RIFAMPIN <=0.5 SENSITIVE Sensitive     Inducible Clindamycin NEGATIVE Sensitive     * STAPHYLOCOCCUS AUREUS  Blood Culture ID Panel (Reflexed)     Status: Abnormal   Collection Time: 06/08/17  5:46 PM  Result Value Ref Range Status   Enterococcus species NOT DETECTED NOT DETECTED Final   Listeria monocytogenes NOT DETECTED NOT DETECTED Final   Staphylococcus species DETECTED (A) NOT DETECTED Final    Comment: CRITICAL RESULT CALLED TO, READ BACK BY AND VERIFIED WITH: J.LEDFORD,PHARMD [id number] 06/09/17 M.CAMPBELL    Staphylococcus aureus DETECTED (A) NOT DETECTED Final    Comment: Methicillin (oxacillin) susceptible Staphylococcus aureus (MSSA). Preferred therapy is anti staphylococcal beta lactam antibiotic (Cefazolin or Nafcillin), unless clinically contraindicated. CRITICAL RESULT CALLED TO, READ BACK BY AND VERIFIED WITH: J.LEDFORD,PHARMD [id number] 06/09/17 M.CAMPBELL    Methicillin resistance NOT DETECTED NOT DETECTED Final   Streptococcus species NOT DETECTED NOT DETECTED Final   Streptococcus agalactiae NOT DETECTED NOT DETECTED Final   Streptococcus pneumoniae NOT DETECTED NOT DETECTED Final   Streptococcus pyogenes NOT DETECTED NOT DETECTED Final   Acinetobacter baumannii NOT DETECTED NOT DETECTED Final   Enterobacteriaceae species NOT DETECTED NOT DETECTED Final   Enterobacter cloacae complex NOT DETECTED NOT DETECTED Final   Escherichia coli NOT DETECTED NOT DETECTED Final   Klebsiella oxytoca NOT DETECTED NOT DETECTED Final   Klebsiella pneumoniae NOT DETECTED NOT DETECTED Final   Proteus species NOT DETECTED NOT DETECTED Final   Serratia marcescens NOT DETECTED NOT DETECTED Final   Haemophilus influenzae NOT DETECTED NOT DETECTED Final   Neisseria meningitidis NOT DETECTED NOT DETECTED Final   Pseudomonas aeruginosa NOT DETECTED NOT DETECTED Final   Candida albicans NOT DETECTED NOT DETECTED Final   Candida  glabrata NOT DETECTED NOT DETECTED Final   Candida krusei NOT DETECTED NOT DETECTED Final   Candida parapsilosis NOT DETECTED NOT DETECTED Final   Candida tropicalis NOT DETECTED NOT DETECTED Final  Urine culture     Status: Abnormal   Collection Time: 06/08/17  7:46 PM  Result Value Ref Range Status   Specimen Description URINE, CLEAN CATCH  Final   Special Requests Normal  Final   Culture >=100,000 COLONIES/mL STAPHYLOCOCCUS AUREUS (A)  Final   Report Status 06/11/2017 FINAL  Final   Organism ID, Bacteria STAPHYLOCOCCUS AUREUS (A)  Final      Susceptibility   Staphylococcus aureus - MIC*    CIPROFLOXACIN <=0.5 SENSITIVE Sensitive     GENTAMICIN <=0.5 SENSITIVE Sensitive     NITROFURANTOIN 32 SENSITIVE Sensitive     OXACILLIN <=0.25 SENSITIVE Sensitive     TETRACYCLINE <=1 SENSITIVE Sensitive     VANCOMYCIN 1 SENSITIVE Sensitive     TRIMETH/SULFA <=10 SENSITIVE Sensitive     CLINDAMYCIN <=0.25 SENSITIVE Sensitive     RIFAMPIN <=0.5 SENSITIVE Sensitive     Inducible Clindamycin NEGATIVE Sensitive     * >=100,000 COLONIES/mL STAPHYLOCOCCUS AUREUS  Respiratory Panel by PCR     Status:  None   Collection Time: 06/08/17  7:46 PM  Result Value Ref Range Status   Adenovirus NOT DETECTED NOT DETECTED Final   Coronavirus 229E NOT DETECTED NOT DETECTED Final   Coronavirus HKU1 NOT DETECTED NOT DETECTED Final   Coronavirus NL63 NOT DETECTED NOT DETECTED Final   Coronavirus OC43 NOT DETECTED NOT DETECTED Final   Metapneumovirus NOT DETECTED NOT DETECTED Final   Rhinovirus / Enterovirus NOT DETECTED NOT DETECTED Final   Influenza A NOT DETECTED NOT DETECTED Final   Influenza B NOT DETECTED NOT DETECTED Final   Parainfluenza Virus 1 NOT DETECTED NOT DETECTED Final   Parainfluenza Virus 2 NOT DETECTED NOT DETECTED Final   Parainfluenza Virus 3 NOT DETECTED NOT DETECTED Final   Parainfluenza Virus 4 NOT DETECTED NOT DETECTED Final   Respiratory Syncytial Virus NOT DETECTED NOT DETECTED Final    Bordetella pertussis NOT DETECTED NOT DETECTED Final   Chlamydophila pneumoniae NOT DETECTED NOT DETECTED Final   Mycoplasma pneumoniae NOT DETECTED NOT DETECTED Final  CSF culture     Status: None   Collection Time: 06/08/17 11:00 PM  Result Value Ref Range Status   Specimen Description CSF  Final   Special Requests Normal  Final   Culture NO GROWTH 3 DAYS  Final   Report Status 06/12/2017 FINAL  Final  Gram stain     Status: None   Collection Time: 06/08/17 11:00 PM  Result Value Ref Range Status   Specimen Description CSF  Final   Special Requests Normal  Final   Gram Stain   Final    CYTOSPIN SLIDE WBC PRESENT,BOTH PMN AND MONONUCLEAR NO ORGANISMS SEEN    Report Status 06/08/2017 FINAL  Final  Culture, blood (single)     Status: None   Collection Time: 06/08/17 11:30 PM  Result Value Ref Range Status   Specimen Description BLOOD LEFT FOREARM  Final   Special Requests   Final    BOTTLES DRAWN AEROBIC AND ANAEROBIC Blood Culture adequate volume   Culture NO GROWTH 5 DAYS  Final   Report Status 06/14/2017 FINAL  Final  MRSA PCR Screening     Status: None   Collection Time: 06/09/17  3:15 AM  Result Value Ref Range Status   MRSA by PCR NEGATIVE NEGATIVE Final    Comment:        The GeneXpert MRSA Assay (FDA approved for NASAL specimens only), is one component of a comprehensive MRSA colonization surveillance program. It is not intended to diagnose MRSA infection nor to guide or monitor treatment for MRSA infections.   Body fluid culture     Status: None   Collection Time: 06/10/17  1:13 PM  Result Value Ref Range Status   Specimen Description SYNOVIAL LEFT KNEE  Final   Special Requests Normal  Final   Gram Stain   Final    FEW WBC PRESENT, PREDOMINANTLY PMN RARE GRAM POSITIVE COCCI IN PAIRS    Culture FEW STAPHYLOCOCCUS AUREUS  Final   Report Status 06/13/2017 FINAL  Final   Organism ID, Bacteria STAPHYLOCOCCUS AUREUS  Final      Susceptibility    Staphylococcus aureus - MIC*    CIPROFLOXACIN <=0.5 SENSITIVE Sensitive     ERYTHROMYCIN <=0.25 SENSITIVE Sensitive     GENTAMICIN <=0.5 SENSITIVE Sensitive     OXACILLIN <=0.25 SENSITIVE Sensitive     TETRACYCLINE <=1 SENSITIVE Sensitive     VANCOMYCIN <=0.5 SENSITIVE Sensitive     TRIMETH/SULFA <=10 SENSITIVE Sensitive     CLINDAMYCIN <=  0.25 SENSITIVE Sensitive     RIFAMPIN <=0.5 SENSITIVE Sensitive     Inducible Clindamycin NEGATIVE Sensitive     PENICILLIN Value in next row Sensitive      SENSITIVE0.06    * FEW STAPHYLOCOCCUS AUREUS  Surgical PCR screen     Status: Abnormal   Collection Time: 06/11/17  1:18 PM  Result Value Ref Range Status   MRSA, PCR NEGATIVE NEGATIVE Final   Staphylococcus aureus POSITIVE (A) NEGATIVE Final    Comment: (NOTE) The Xpert SA Assay (FDA approved for NASAL specimens in patients 64 years of age and older), is one component of a comprehensive surveillance program. It is not intended to diagnose infection nor to guide or monitor treatment.   Aerobic/Anaerobic Culture (surgical/deep wound)     Status: None (Preliminary result)   Collection Time: 06/11/17  3:21 PM  Result Value Ref Range Status   Specimen Description ABSCESS LEFT KNEE  Final   Special Requests NONE  Final   Gram Stain   Final    RARE WBC PRESENT, PREDOMINANTLY PMN RARE GRAM POSITIVE COCCI IN PAIRS    Culture   Final    FEW STAPHYLOCOCCUS AUREUS NO ANAEROBES ISOLATED; CULTURE IN PROGRESS FOR 5 DAYS    Report Status PENDING  Incomplete   Organism ID, Bacteria STAPHYLOCOCCUS AUREUS  Final      Susceptibility   Staphylococcus aureus - MIC*    CIPROFLOXACIN <=0.5 SENSITIVE Sensitive     ERYTHROMYCIN <=0.25 SENSITIVE Sensitive     GENTAMICIN <=0.5 SENSITIVE Sensitive     OXACILLIN <=0.25 SENSITIVE Sensitive     TETRACYCLINE <=1 SENSITIVE Sensitive     VANCOMYCIN <=0.5 SENSITIVE Sensitive     TRIMETH/SULFA <=10 SENSITIVE Sensitive     CLINDAMYCIN <=0.25 SENSITIVE Sensitive      RIFAMPIN <=0.5 SENSITIVE Sensitive     Inducible Clindamycin NEGATIVE Sensitive     * FEW STAPHYLOCOCCUS AUREUS  Culture, blood (routine x 2)     Status: None (Preliminary result)   Collection Time: 06/13/17 11:59 AM  Result Value Ref Range Status   Specimen Description BLOOD LEFT ANTECUBITAL  Final   Special Requests IN PEDIATRIC BOTTLE Blood Culture adequate volume  Final   Culture  Setup Time   Final    GRAM POSITIVE COCCI IN CLUSTERS IN PEDIATRIC BOTTLE CRITICAL VALUE NOTED.  VALUE IS CONSISTENT WITH PREVIOUSLY REPORTED AND CALLED VALUE.    Culture GRAM POSITIVE COCCI  Final   Report Status PENDING  Incomplete  Culture, blood (routine x 2)     Status: None (Preliminary result)   Collection Time: 06/13/17 12:07 PM  Result Value Ref Range Status   Specimen Description BLOOD LEFT ANTECUBITAL  Final   Special Requests IN PEDIATRIC BOTTLE Blood Culture adequate volume  Final   Culture  Setup Time   Final    GRAM POSITIVE COCCI IN CLUSTERS IN PEDIATRIC BOTTLE CRITICAL RESULT CALLED TO, READ BACK BY AND VERIFIED WITH: T EGAN,PHARMD AT [id number] 06/14/17 BY L BENFIELD    Culture GRAM POSITIVE COCCI  Final   Report Status PENDING  Incomplete         Radiology Studies: No results found.      Scheduled Meds: . enoxaparin (LOVENOX) injection  30 mg Subcutaneous Q24H  . Influenza vac split quadrivalent PF  0.5 mL Intramuscular Tomorrow-1000  . pantoprazole  40 mg Oral Q1200  . thiamine  100 mg Oral Daily   Continuous Infusions: . sodium chloride    . sodium chloride 75  mL/hr at 06/13/17 0743  . nafcillin IV 2 g (06/14/17 1034)     LOS: 5 days    Time spent: 35min    Zannie CovePreetha Loren Vicens, MD [facility] Page via [email or web address], password [email or web address] After 7PM please contact night-coverage  06/14/2017, 12:32 PM

## 2017-06-14 NOTE — Evaluation (Signed)
Physical Therapy Evaluation Patient Details Name: Marcus [name] MRN: [id number] DOB: [birth year] Today's Date: 06/14/2017   History of Present Illness  Pt is a 52 y/o male with history of polysubstance abuse and hep C was found down by his significant other, subsequently brought to the emergency room, he was obtunded and found to have severe metabolic encephalopathy, infective endocarditis, MSSA bacteremia, septic emboli/embolic strokes and left knee septic arthritis. Pt underwent left knee I & D on 1/9.    Clinical Impression  Pt presented supine in bed with HOB elevated, initially asleep and difficult to arouse. Pt responded to auditory and tactile stimuli, but remained lethargic throughout. Pt noted to have a large, liquid BM that appeared dark with some red. Pt's RN was notified. Pt also with redness of his skin in the peri area which his RN was also notified about. Pt currently very limited secondary to lethargy and pain. He required total A x3 (PT, therapy tech and NT) to roll bilaterally for pericare. Pt would continue to benefit from skilled physical therapy services at this time while admitted and after d/c to address the below listed limitations in order to improve overall safety and independence with functional mobility.       Follow Up Recommendations SNF;Supervision/Assistance - 24 hour    Equipment Recommendations  None recommended by PT    Recommendations for Other Services       Precautions / Restrictions Precautions Precautions: Fall Restrictions Weight Bearing Restrictions: Yes LLE Weight Bearing: Weight bearing as tolerated      Mobility  Bed Mobility Overal bed mobility: Needs Assistance Bed Mobility: Rolling Rolling: Total assist;+2 for physical assistance         General bed mobility comments: pt only agreeable to bed mobility (rolling) to allow for pericare as pt with a large, liquid BM during assessment; pt required total A to roll bilaterally and was very  limited secondary to pain  Transfers                 General transfer comment: unable at this time  Ambulation/Gait                Stairs            Wheelchair Mobility    Modified Rankin (Stroke Patients Only)       Balance                                             Pertinent Vitals/Pain Pain Assessment: Faces Faces Pain Scale: Hurts whole lot Pain Location: L knee Pain Descriptors / Indicators: Sore;Grimacing;Guarding;Moaning Pain Intervention(s): Monitored during session;Repositioned    Home Living Family/patient expects to be discharged to:: Unsure                 Additional Comments: pt lethargic and very focused on pain throughout    Prior Function           Comments: Unsure; pt lethargic and very focused on pain throughout     Hand Dominance        Extremity/Trunk Assessment   Upper Extremity Assessment Upper Extremity Assessment: Defer to OT evaluation    Lower Extremity Assessment Lower Extremity Assessment: Generalized weakness;RLE deficits/detail;LLE deficits/detail RLE Deficits / Details: pt did not demonstrate any active movement throughout; reported sensation was grossly intact to light touch LLE Deficits / Details: pt did  not demonstrate any active movement throughout; reported sensation was grossly intact to light touch; ACE wrap and dressing in place around L knee with some edema noted in lower leg and foot LLE: Unable to fully assess due to pain       Communication   Communication: No difficulties  Cognition Arousal/Alertness: Lethargic Behavior During Therapy: Anxious Overall Cognitive Status: Impaired/Different from baseline Area of Impairment: Following commands;Safety/judgement;Problem solving                       Following Commands: Follows one step commands inconsistently Safety/Judgement: Decreased awareness of deficits;Decreased awareness of safety   Problem  Solving: Difficulty sequencing;Requires verbal cues;Requires tactile cues        General Comments      Exercises     Assessment/Plan    PT Assessment Patient needs continued PT services  PT Problem List Decreased strength;Decreased range of motion;Decreased activity tolerance;Decreased balance;Decreased mobility;Decreased coordination;Decreased cognition;Decreased knowledge of use of DME;Decreased safety awareness;Decreased knowledge of precautions;Pain       PT Treatment Interventions DME instruction;Gait training;Stair training;Functional mobility training;Therapeutic activities;Therapeutic exercise;Balance training;Neuromuscular re-education;Cognitive remediation;Patient/family education    PT Goals (Current goals can be found in the Care Plan section)  Acute Rehab PT Goals Patient Stated Goal: decrease pain PT Goal Formulation: Patient unable to participate in goal setting Time For Goal Achievement: 06/28/17 Potential to Achieve Goals: Fair    Frequency Min 2X/week   Barriers to discharge        Co-evaluation               AM-PAC PT "6 Clicks" Daily Activity  Outcome Measure Difficulty turning over in bed (including adjusting bedclothes, sheets and blankets)?: Unable Difficulty moving from lying on back to sitting on the side of the bed? : Unable Difficulty sitting down on and standing up from a chair with arms (e.g., wheelchair, bedside commode, etc,.)?: Unable Help needed moving to and from a bed to chair (including a wheelchair)?: Total Help needed walking in hospital room?: Total Help needed climbing 3-5 steps with a railing? : Total 6 Click Score: 6    End of Session   Activity Tolerance: Patient limited by pain Patient left: in bed;with call bell/phone within reach;with bed alarm set;Other (comment)(NT in room) Nurse Communication: Mobility status;Need for lift equipment;Precautions PT Visit Diagnosis: Other abnormalities of gait and mobility  (R26.89);Pain Pain - Right/Left: Left Pain - part of body: Knee    Time: [id number] PT Time Calculation (min) (ACUTE ONLY): 31 min   Charges:   PT Evaluation $PT Eval Moderate Complexity: 1 Mod PT Treatments $Therapeutic Activity: 8-22 mins   PT G Codes:        [place]villeJennifer Hazell Siwik, PT, DPT [phone]   Alessandra BevelsJennifer M Kinya Meine 06/14/2017, 3:35 PM

## 2017-06-14 NOTE — Progress Notes (Addendum)
PHARMACY - PHYSICIAN COMMUNICATION CRITICAL VALUE ALERT - BLOOD CULTURE IDENTIFICATION (BCID)  Results for orders placed or performed during the hospital encounter of 06/08/17  Blood Culture ID Panel (Reflexed) (Collected: 06/08/2017  5:46 PM)  Result Value Ref Range   Enterococcus species NOT DETECTED NOT DETECTED   Listeria monocytogenes NOT DETECTED NOT DETECTED   Staphylococcus species DETECTED (A) NOT DETECTED   Staphylococcus aureus DETECTED (A) NOT DETECTED   Methicillin resistance NOT DETECTED NOT DETECTED   Streptococcus species NOT DETECTED NOT DETECTED   Streptococcus agalactiae NOT DETECTED NOT DETECTED   Streptococcus pneumoniae NOT DETECTED NOT DETECTED   Streptococcus pyogenes NOT DETECTED NOT DETECTED   Acinetobacter baumannii NOT DETECTED NOT DETECTED   Enterobacteriaceae species NOT DETECTED NOT DETECTED   Enterobacter cloacae complex NOT DETECTED NOT DETECTED   Escherichia coli NOT DETECTED NOT DETECTED   Klebsiella oxytoca NOT DETECTED NOT DETECTED   Klebsiella pneumoniae NOT DETECTED NOT DETECTED   Proteus species NOT DETECTED NOT DETECTED   Serratia marcescens NOT DETECTED NOT DETECTED   Haemophilus influenzae NOT DETECTED NOT DETECTED   Neisseria meningitidis NOT DETECTED NOT DETECTED   Pseudomonas aeruginosa NOT DETECTED NOT DETECTED   Candida albicans NOT DETECTED NOT DETECTED   Candida glabrata NOT DETECTED NOT DETECTED   Candida krusei NOT DETECTED NOT DETECTED   Candida parapsilosis NOT DETECTED NOT DETECTED   Candida tropicalis NOT DETECTED NOT DETECTED    Name of physician (or Provider) Contacted: Dr. Jomarie LongsJoseph   Changes to prescribed antibiotics required: none.  Already on Nafcillin 2gm IV q4h per ID  Micro lab called to report that 06/13/17 blood culture is still growing MSSA,   Previously grew MSSA from 06/08/17 blood cultures  Dennie Fettersgan, [place] Monica Donovan, [place] Pager: [phone] 06/14/2017  8:50 AM

## 2017-06-15 LAB — CULTURE, BLOOD (ROUTINE X 2)
Special Requests: ADEQUATE
Special Requests: ADEQUATE

## 2017-06-15 LAB — BASIC METABOLIC PANEL
Anion gap: 10 (ref 5–15)
BUN: 14 mg/dL (ref 6–20)
CHLORIDE: 103 mmol/L (ref 101–111)
CO2: 18 mmol/L — AB (ref 22–32)
Calcium: 7.1 mg/dL — ABNORMAL LOW (ref 8.9–10.3)
Creatinine, Ser: 1.37 mg/dL — ABNORMAL HIGH (ref 0.61–1.24)
GFR calc non Af Amer: 58 mL/min — ABNORMAL LOW (ref >60)
Glucose, Bld: 169 mg/dL — ABNORMAL HIGH (ref 65–99)
POTASSIUM: 3.4 mmol/L — AB (ref 3.5–5.1)
SODIUM: 131 mmol/L — AB (ref 135–145)

## 2017-06-15 LAB — CBC
HEMATOCRIT: 27.1 % — AB (ref 39.0–52.0)
HEMOGLOBIN: 8.9 g/dL — AB (ref 13.0–17.0)
MCH: 27.6 pg (ref 26.0–34.0)
MCHC: 32.8 g/dL (ref 30.0–36.0)
MCV: 84.2 fL (ref 78.0–100.0)
Platelets: 288 10*3/uL (ref 150–400)
RBC: 3.22 MIL/uL — AB (ref 4.22–5.81)
RDW: 15 % (ref 11.5–15.5)
WBC: 13.2 10*3/uL — ABNORMAL HIGH (ref 4.0–10.5)

## 2017-06-15 LAB — GLUCOSE, CAPILLARY
Glucose-Capillary: 107 mg/dL — ABNORMAL HIGH (ref 65–99)
Glucose-Capillary: 100 mg/dL — ABNORMAL HIGH (ref 65–99)
Glucose-Capillary: 119 mg/dL — ABNORMAL HIGH (ref 65–99)
Glucose-Capillary: 96 mg/dL (ref 65–99)
Glucose-Capillary: 108 mg/dL — ABNORMAL HIGH (ref 65–99)

## 2017-06-15 MED ORDER — MORPHINE SULFATE (PF) 2 MG/ML IV SOLN
2.0000 mg | INTRAVENOUS | Status: DC | PRN
Start: 2017-06-15 — End: 2017-06-23
  Administered 2017-06-15 – 2017-06-23 (×30): 2 mg via INTRAVENOUS
  Filled 2017-06-15 (×34): qty 1

## 2017-06-15 MED ORDER — POTASSIUM CHLORIDE CRYS ER 20 MEQ PO TBCR
40.0000 meq | EXTENDED_RELEASE_TABLET | Freq: Two times a day (BID) | ORAL | Status: DC
  Administered 2017-06-15 – 2017-06-16 (×4): 40 meq via ORAL
  Filled 2017-06-15 (×4): qty 2

## 2017-06-15 MED ORDER — SENNOSIDES-DOCUSATE SODIUM 8.6-50 MG PO TABS
1.0000 | ORAL_TABLET | Freq: Two times a day (BID) | ORAL | Status: DC
  Administered 2017-06-15 – 2017-06-22 (×13): 1 via ORAL
  Filled 2017-06-15 (×14): qty 1

## 2017-06-15 MED ORDER — BOOST / RESOURCE BREEZE PO LIQD CUSTOM
1.0000 | Freq: Three times a day (TID) | ORAL | Status: DC
  Administered 2017-06-15 – 2017-06-23 (×25): 1 via ORAL

## 2017-06-15 MED ORDER — FUROSEMIDE 10 MG/ML IJ SOLN
40.0000 mg | Freq: Two times a day (BID) | INTRAMUSCULAR | Status: DC
  Administered 2017-06-15 (×2): 40 mg via INTRAVENOUS
  Filled 2017-06-15 (×2): qty 4

## 2017-06-15 NOTE — Plan of Care (Signed)
  Progressing Education: Knowledge of patient specific risk factors addressed and post discharge goals established will improve 06/15/2017 0352 - Progressing by Burtis Junesrewery, Jakeria Caissie, RN  Patient complained of severe pain after receiving morphine 2mg  iv dose, notified team and orders modified to 4mg  of morphine and oxycodone prn. Patient tolerating pain management, satisfied at this time.   Left arm and leg both remain swollen, weak, and painful to move.

## 2017-06-15 NOTE — Progress Notes (Signed)
PHARMACY - PHYSICIAN COMMUNICATION CRITICAL VALUE ALERT - BLOOD CULTURE IDENTIFICATION (BCID)  Marcus [name] is an 52 y.o. male [facility] with a chief complaint of MSSA bacteremia/endocarditis   Results for orders placed or performed during the hospital encounter of 06/08/17  Blood Culture ID Panel (Reflexed) (Collected: 06/08/2017  5:46 PM)  Result Value Ref Range   Enterococcus species NOT DETECTED NOT DETECTED   Listeria monocytogenes NOT DETECTED NOT DETECTED   Staphylococcus species DETECTED (A) NOT DETECTED   Staphylococcus aureus DETECTED (A) NOT DETECTED   Methicillin resistance NOT DETECTED NOT DETECTED   Streptococcus species NOT DETECTED NOT DETECTED   Streptococcus agalactiae NOT DETECTED NOT DETECTED   Streptococcus pneumoniae NOT DETECTED NOT DETECTED   Streptococcus pyogenes NOT DETECTED NOT DETECTED   Acinetobacter baumannii NOT DETECTED NOT DETECTED   Enterobacteriaceae species NOT DETECTED NOT DETECTED   Enterobacter cloacae complex NOT DETECTED NOT DETECTED   Escherichia coli NOT DETECTED NOT DETECTED   Klebsiella oxytoca NOT DETECTED NOT DETECTED   Klebsiella pneumoniae NOT DETECTED NOT DETECTED   Proteus species NOT DETECTED NOT DETECTED   Serratia marcescens NOT DETECTED NOT DETECTED   Haemophilus influenzae NOT DETECTED NOT DETECTED   Neisseria meningitidis NOT DETECTED NOT DETECTED   Pseudomonas aeruginosa NOT DETECTED NOT DETECTED   Candida albicans NOT DETECTED NOT DETECTED   Candida glabrata NOT DETECTED NOT DETECTED   Candida krusei NOT DETECTED NOT DETECTED   Candida parapsilosis NOT DETECTED NOT DETECTED   Candida tropicalis NOT DETECTED NOT DETECTED   Name of physician (or Provider) Contacted: Dr. Jomarie LongsJoseph  Changes to prescribed antibiotics recommended:   No changes, already on Nafcillin 2g IV q4h per ID.  Micro lab called to report #1/2 surveillance cultures from 1/12 still growing MSSA.  Alvester MorinKendra Chalice Philbert, B.S.,  PharmD Clinical Pharmacist [place] [facility] 

## 2017-06-15 NOTE — Progress Notes (Signed)
PROGRESS NOTE    Marcus [name]  ZOX:[id number] DOB: 10-15-1965 DOA: 06/08/2017 PCP: Patient, No Pcp Per  Brief Narrative: PCC on [facility] on [facility] 1/10 Briefly Marcus [name] is a 52 year old male with history of polysubstance abuse and hep C was found down by his significant other, subsequently brought to the emergency room, he was obtunded and found to have severe metabolic encephalopathy, infective endocarditis, MSSA bacteremia, septic emboli/embolic strokes and left knee septic arthritis. 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 -With resultant left-sided weakness, left field cut, some dysarthria and dysphagia -Treatment for this is antibiotics for infective endocarditis, continue nafcillin -Will request physical, occupational eval -speech therapy evaluation completed, dysphagia 1 diet recommended at this time -Appreciate neurology input recommended follow-up with [facility] neurology in 4-6 weeks  Fluid overload/iatrogenic and third spacing from sepsis -ECHo with preserved EF -IV lasix and KCL today, monitor I/O, Bmet  Hepatitis C antibody positive -Checking hep C genotype and viral load -Follow-up with ID after acute  illness is resolved  Polysubstance abuse -Per family history of substance abuse but no known history of IV drug use as far as they know -UDS positive for cocaine, opiates and benzos on admission  Acute kidney injury -Likely secondary to sepsis, creatinine was 3.0 on admission now down to 1.4 -Baseline unknown -Kidney function improved, likely new baseline  Thrombocytopenia -Baseline unknown -Likely worsened in the setting of sepsis -improving  DVT prophylaxis: hold lovenox, had trace blood in stool yesterday, resume 1/14 Code Status:  full code  Family Communication: dtr at bedside  Disposition Plan: SNF early next week  Consultants:   ID  PCCM  Ortho   neurology  Procedures: PROCEDURE: Arthrotomy of left knee and irrigation and debridement for septic arthritis 1/9   2-D echocardiogram   Antimicrobials:  Antibiotics Given (last 72 hours)    Date/Time Action Medication Dose Rate   06/12/17 1203 New Bag/Given   nafcillin 2 g in dextrose 5 % 100 mL IVPB 2 g 200 mL/hr   06/12/17 1501 New Bag/Given   nafcillin 2 g in dextrose 5 % 100 mL IVPB 2 g 200 mL/hr   06/12/17 1716 New Bag/Given   nafcillin 2 g in dextrose 5 % 100 mL IVPB 2 g 200 mL/hr   06/12/17 2220 New Bag/Given   nafcillin 2 g in dextrose 5 % 100 mL IVPB 2 g 200 mL/hr   06/13/17 0224 New Bag/Given   nafcillin 2 g in dextrose 5 % 100 mL IVPB 2 g 200 mL/hr   06/13/17 0624 New Bag/Given   nafcillin 2 g in dextrose 5 % 100 mL IVPB 2 g 200 mL/hr   06/13/17 0938 New Bag/Given   nafcillin 2 g in dextrose 5 % 100 mL IVPB  2 g 200 mL/hr   06/13/17 1459 New Bag/Given   nafcillin 2 g in dextrose 5 % 100 mL IVPB 2 g 200 mL/hr   06/13/17 1848 New Bag/Given   nafcillin 2 g in dextrose 5 % 100 mL IVPB 2 g 200 mL/hr   06/13/17 2227 New Bag/Given   nafcillin 2 g in dextrose 5 % 100 mL IVPB 2 g 200 mL/hr   06/14/17 0307 New Bag/Given   nafcillin 2 g in dextrose 5 % 100 mL IVPB 2 g 200 mL/hr   06/14/17 0535 New Bag/Given    nafcillin 2 g in dextrose 5 % 100 mL IVPB 2 g 200 mL/hr   06/14/17 1034 New Bag/Given   nafcillin 2 g in dextrose 5 % 100 mL IVPB 2 g 200 mL/hr   06/14/17 1514 New Bag/Given   nafcillin 2 g in dextrose 5 % 100 mL IVPB 2 g 200 mL/hr   06/14/17 1716 New Bag/Given   nafcillin 2 g in dextrose 5 % 100 mL IVPB 2 g 200 mL/hr   06/14/17 2224 New Bag/Given   nafcillin 2 g in dextrose 5 % 100 mL IVPB 2 g 200 mL/hr   06/15/17 0201 New Bag/Given   nafcillin 2 g in dextrose 5 % 100 mL IVPB 2 g 200 mL/hr   06/15/17 0617 New Bag/Given   nafcillin 2 g in dextrose 5 % 100 mL IVPB 2 g 200 mL/hr   06/15/17 0845 New Bag/Given   nafcillin 2 g in dextrose 5 % 100 mL IVPB 2 g 200 mL/hr      Subjective: -swollen all over, L side very weak, eating less  Objective: Vitals:   06/14/17 1551 06/14/17 2215 06/15/17 0500 06/15/17 0540  BP: 131/73 127/71  119/70  Pulse: (!) 110 (!) 105  99  Resp: 20 20  20   Temp: (!) 100.9 F (38.3 C) 98.3 F (36.8 C)  98.5 F (36.9 C)  TempSrc: Oral Oral    SpO2: 99% 100%  97%  Weight:   88 kg (194 lb 0.1 oz)   Height:        Intake/Output Summary (Last 24 hours) at 06/15/2017 1040 Last data filed at 06/15/2017 1038 Gross per 24 hour  Intake 1081.25 ml  Output 3700 ml  Net -2618.75 ml   Filed Weights   06/13/17 0449 06/14/17 0500 06/15/17 0500  Weight: 83.9 kg (184 lb 15.5 oz) 85.3 kg (188 lb 0.8 oz) 88 kg (194 lb 0.1 oz)    Examination: Gen: Awake, Alert, Oriented X 3, chronically ill appearing HEENT: PERRLA, Neck supple, no JVD Lungs: decreased at bases CVS: RRR,No Gallops,Rubs or new Murmurs Abd: soft, Non tender, non distended, BS present Extremities: 2plus edema diffusely, L knee with dressing Skin: no new rashes Neuro: L hemiparesis      Data Reviewed:   CBC: Recent Labs  Lab 06/08/17 1746 06/08/17 1757 06/09/17 0719 06/10/17 0451 06/13/17 0140 06/15/17 0232  WBC 33.5*  --  20.6* 11.8* 13.1* 13.2*  NEUTROABS 32.2*  --   --   --   --    --   HGB 16.4 17.7* 11.5* 10.0* 8.9* 8.9*  HCT 46.9 52.0 33.2* 29.6* 26.3* 27.1*  MCV 82.4  --  81.6 81.3 82.2 84.2  PLT 134*  --  75* 55* 142* 288   Basic Metabolic Panel: Recent Labs  Lab 06/09/17 0719 06/10/17 0451 06/10/17 1249 06/13/17 0140 06/13/17 0834 06/14/17 0752 06/15/17 0232  NA 131* 129* 130* 132*  --  130* 131*  K 3.6 2.7* 3.1* 2.2*  --  3.1* 3.4*  CL 98* 100* 101 102  --  104 103  CO2 20* 17* 20* 21*  --  19* 18*  GLUCOSE 131* 117* 108* 159*  --  117* 169*  BUN 82* 68* 58* 23*  --  15 14  CREATININE 2.65* 2.01* 1.89* 1.40*  --  1.22 1.37*  CALCIUM 6.7* 6.3* 6.8* 6.7*  --  6.8* 7.1*  MG 2.4 2.2  --   --  1.7  --   --   PHOS 6.5* 4.1  --   --   --   --   --    GFR: Estimated Creatinine Clearance: 67.9 mL/min (A) (by C-G formula based on SCr of 1.37 mg/dL (H)). Liver Function Tests: Recent Labs  Lab 06/08/17 1746 06/08/17 2337 06/09/17 0719  AST 187* 205* 204*  ALT 107* 98* 101*  ALKPHOS 190* 105 92  BILITOT 2.2* 1.4* 1.1  PROT 7.4 5.1* 4.7*  ALBUMIN 2.3* 1.6* 1.4*   Recent Labs  Lab 06/08/17 1746  LIPASE 19   No results for input(s): AMMONIA in the last 168 hours. Coagulation Profile: Recent Labs  Lab 06/08/17 1746  INR 1.49   Cardiac Enzymes: Recent Labs  Lab 06/08/17 1746 06/08/17 2337 06/09/17 0719 06/09/17 1224 06/09/17 1832  CKTOTAL 791*  --  6,435*  --   --   TROPONINI  --  0.36* 0.29* 0.22* 0.24*   BNP (last 3 results) No results for input(s): PROBNP in the last 8760 hours. HbA1C: No results for input(s): HGBA1C in the last 72 hours. CBG: Recent Labs  Lab 06/14/17 1221 06/14/17 1642 06/14/17 2053 06/15/17 0458 06/15/17 0758  GLUCAP 113* 108* 89 107* 100*   Lipid Profile: No results for input(s): CHOL, HDL, LDLCALC, TRIG, CHOLHDL, LDLDIRECT in the last 72 hours. Thyroid Function Tests: No results for input(s): TSH, T4TOTAL, FREET4, T3FREE, THYROIDAB in the last 72 hours. Anemia Panel: No results for input(s):  VITAMINB12, FOLATE, FERRITIN, TIBC, IRON, RETICCTPCT in the last 72 hours. Urine analysis:    Component Value Date/Time   COLORURINE AMBER (A) 06/08/2017 1724   APPEARANCEUR CLOUDY (A) 06/08/2017 1724   LABSPEC 1.016 06/08/2017 1724   PHURINE 5.0 06/08/2017 1724   GLUCOSEU NEGATIVE 06/08/2017 1724   HGBUR LARGE (A) 06/08/2017 1724   BILIRUBINUR NEGATIVE 06/08/2017 1724   KETONESUR NEGATIVE 06/08/2017 1724   PROTEINUR NEGATIVE 06/08/2017 1724   NITRITE NEGATIVE 06/08/2017 1724   LEUKOCYTESUR NEGATIVE 06/08/2017 1724   Sepsis Labs: @LABRCNTIP (procalcitonin:4,lacticidven:4)  ) Recent Results (from the past 240 hour(s))  Culture, blood (routine x 2)     Status: Abnormal   Collection Time: 06/08/17  5:30 PM  Result Value Ref Range Status   Specimen Description BLOOD LEFT ARM  Final   Special Requests IN PEDIATRIC BOTTLE Blood Culture adequate volume  Final   Culture  Setup Time   Final    GRAM POSITIVE COCCI IN CLUSTERS IN PEDIATRIC BOTTLE CRITICAL VALUE NOTED.  VALUE IS CONSISTENT WITH PREVIOUSLY REPORTED AND CALLED VALUE.    Culture (A)  Final    STAPHYLOCOCCUS AUREUS SUSCEPTIBILITIES PERFORMED ON PREVIOUS CULTURE WITHIN THE LAST 5 DAYS.    Report Status 06/11/2017 FINAL  Final  Culture, blood (routine x 2)     Status: Abnormal   Collection Time: 06/08/17  5:46 PM  Result Value Ref Range Status   Specimen Description BLOOD RIGHT ANTECUBITAL  Final   Special Requests   Final  BOTTLES DRAWN AEROBIC AND ANAEROBIC Blood Culture adequate volume   Culture  Setup Time   Final    IN BOTH AEROBIC AND ANAEROBIC BOTTLES GRAM POSITIVE COCCI IN CLUSTERS CRITICAL RESULT CALLED TO, READ BACK BY AND VERIFIED WITH: J.LEDFORD,PHARMD 1610 06/09/17 M.CAMPBELL    Culture STAPHYLOCOCCUS AUREUS (A)  Final   Report Status 06/11/2017 FINAL  Final   Organism ID, Bacteria STAPHYLOCOCCUS AUREUS  Final      Susceptibility   Staphylococcus aureus - MIC*    CIPROFLOXACIN <=0.5 SENSITIVE Sensitive      ERYTHROMYCIN <=0.25 SENSITIVE Sensitive     GENTAMICIN <=0.5 SENSITIVE Sensitive     OXACILLIN <=0.25 SENSITIVE Sensitive     TETRACYCLINE <=1 SENSITIVE Sensitive     VANCOMYCIN <=0.5 SENSITIVE Sensitive     TRIMETH/SULFA <=10 SENSITIVE Sensitive     CLINDAMYCIN <=0.25 SENSITIVE Sensitive     RIFAMPIN <=0.5 SENSITIVE Sensitive     Inducible Clindamycin NEGATIVE Sensitive     * STAPHYLOCOCCUS AUREUS  Blood Culture ID Panel (Reflexed)     Status: Abnormal   Collection Time: 06/08/17  5:46 PM  Result Value Ref Range Status   Enterococcus species NOT DETECTED NOT DETECTED Final   Listeria monocytogenes NOT DETECTED NOT DETECTED Final   Staphylococcus species DETECTED (A) NOT DETECTED Final    Comment: CRITICAL RESULT CALLED TO, READ BACK BY AND VERIFIED WITH: J.LEDFORD,PHARMD [id number] 06/09/17 M.CAMPBELL    Staphylococcus aureus DETECTED (A) NOT DETECTED Final    Comment: Methicillin (oxacillin) susceptible Staphylococcus aureus (MSSA). Preferred therapy is anti staphylococcal beta lactam antibiotic (Cefazolin or Nafcillin), unless clinically contraindicated. CRITICAL RESULT CALLED TO, READ BACK BY AND VERIFIED WITH: J.LEDFORD,PHARMD [id number] 06/09/17 M.CAMPBELL    Methicillin resistance NOT DETECTED NOT DETECTED Final   Streptococcus species NOT DETECTED NOT DETECTED Final   Streptococcus agalactiae NOT DETECTED NOT DETECTED Final   Streptococcus pneumoniae NOT DETECTED NOT DETECTED Final   Streptococcus pyogenes NOT DETECTED NOT DETECTED Final   Acinetobacter baumannii NOT DETECTED NOT DETECTED Final   Enterobacteriaceae species NOT DETECTED NOT DETECTED Final   Enterobacter cloacae complex NOT DETECTED NOT DETECTED Final   Escherichia coli NOT DETECTED NOT DETECTED Final   Klebsiella oxytoca NOT DETECTED NOT DETECTED Final   Klebsiella pneumoniae NOT DETECTED NOT DETECTED Final   Proteus species NOT DETECTED NOT DETECTED Final   Serratia marcescens NOT DETECTED NOT DETECTED Final    Haemophilus influenzae NOT DETECTED NOT DETECTED Final   Neisseria meningitidis NOT DETECTED NOT DETECTED Final   Pseudomonas aeruginosa NOT DETECTED NOT DETECTED Final   Candida albicans NOT DETECTED NOT DETECTED Final   Candida glabrata NOT DETECTED NOT DETECTED Final   Candida krusei NOT DETECTED NOT DETECTED Final   Candida parapsilosis NOT DETECTED NOT DETECTED Final   Candida tropicalis NOT DETECTED NOT DETECTED Final  Urine culture     Status: Abnormal   Collection Time: 06/08/17  7:46 PM  Result Value Ref Range Status   Specimen Description URINE, CLEAN CATCH  Final   Special Requests Normal  Final   Culture >=100,000 COLONIES/mL STAPHYLOCOCCUS AUREUS (A)  Final   Report Status 06/11/2017 FINAL  Final   Organism ID, Bacteria STAPHYLOCOCCUS AUREUS (A)  Final      Susceptibility   Staphylococcus aureus - MIC*    CIPROFLOXACIN <=0.5 SENSITIVE Sensitive     GENTAMICIN <=0.5 SENSITIVE Sensitive     NITROFURANTOIN 32 SENSITIVE Sensitive     OXACILLIN <=0.25 SENSITIVE Sensitive     TETRACYCLINE <=1 SENSITIVE Sensitive  VANCOMYCIN 1 SENSITIVE Sensitive     TRIMETH/SULFA <=10 SENSITIVE Sensitive     CLINDAMYCIN <=0.25 SENSITIVE Sensitive     RIFAMPIN <=0.5 SENSITIVE Sensitive     Inducible Clindamycin NEGATIVE Sensitive     * >=100,000 COLONIES/mL STAPHYLOCOCCUS AUREUS  Respiratory Panel by PCR     Status: None   Collection Time: 06/08/17  7:46 PM  Result Value Ref Range Status   Adenovirus NOT DETECTED NOT DETECTED Final   Coronavirus 229E NOT DETECTED NOT DETECTED Final   Coronavirus HKU1 NOT DETECTED NOT DETECTED Final   Coronavirus NL63 NOT DETECTED NOT DETECTED Final   Coronavirus OC43 NOT DETECTED NOT DETECTED Final   Metapneumovirus NOT DETECTED NOT DETECTED Final   Rhinovirus / Enterovirus NOT DETECTED NOT DETECTED Final   Influenza A NOT DETECTED NOT DETECTED Final   Influenza B NOT DETECTED NOT DETECTED Final   Parainfluenza Virus 1 NOT DETECTED NOT DETECTED Final    Parainfluenza Virus 2 NOT DETECTED NOT DETECTED Final   Parainfluenza Virus 3 NOT DETECTED NOT DETECTED Final   Parainfluenza Virus 4 NOT DETECTED NOT DETECTED Final   Respiratory Syncytial Virus NOT DETECTED NOT DETECTED Final   Bordetella pertussis NOT DETECTED NOT DETECTED Final   Chlamydophila pneumoniae NOT DETECTED NOT DETECTED Final   Mycoplasma pneumoniae NOT DETECTED NOT DETECTED Final  CSF culture     Status: None   Collection Time: 06/08/17 11:00 PM  Result Value Ref Range Status   Specimen Description CSF  Final   Special Requests Normal  Final   Culture NO GROWTH 3 DAYS  Final   Report Status 06/12/2017 FINAL  Final  Gram stain     Status: None   Collection Time: 06/08/17 11:00 PM  Result Value Ref Range Status   Specimen Description CSF  Final   Special Requests Normal  Final   Gram Stain   Final    CYTOSPIN SLIDE WBC PRESENT,BOTH PMN AND MONONUCLEAR NO ORGANISMS SEEN    Report Status 06/08/2017 FINAL  Final  Culture, blood (single)     Status: None   Collection Time: 06/08/17 11:30 PM  Result Value Ref Range Status   Specimen Description BLOOD LEFT FOREARM  Final   Special Requests   Final    BOTTLES DRAWN AEROBIC AND ANAEROBIC Blood Culture adequate volume   Culture NO GROWTH 5 DAYS  Final   Report Status 06/14/2017 FINAL  Final  MRSA PCR Screening     Status: None   Collection Time: 06/09/17  3:15 AM  Result Value Ref Range Status   MRSA by PCR NEGATIVE NEGATIVE Final    Comment:        The GeneXpert MRSA Assay (FDA approved for NASAL specimens only), is one component of a comprehensive MRSA colonization surveillance program. It is not intended to diagnose MRSA infection nor to guide or monitor treatment for MRSA infections.   Body fluid culture     Status: None   Collection Time: 06/10/17  1:13 PM  Result Value Ref Range Status   Specimen Description SYNOVIAL LEFT KNEE  Final   Special Requests Normal  Final   Gram Stain   Final    FEW WBC  PRESENT, PREDOMINANTLY PMN RARE GRAM POSITIVE COCCI IN PAIRS    Culture FEW STAPHYLOCOCCUS AUREUS  Final   Report Status 06/13/2017 FINAL  Final   Organism ID, Bacteria STAPHYLOCOCCUS AUREUS  Final      Susceptibility   Staphylococcus aureus - MIC*    CIPROFLOXACIN <=0.5  SENSITIVE Sensitive     ERYTHROMYCIN <=0.25 SENSITIVE Sensitive     GENTAMICIN <=0.5 SENSITIVE Sensitive     OXACILLIN <=0.25 SENSITIVE Sensitive     TETRACYCLINE <=1 SENSITIVE Sensitive     VANCOMYCIN <=0.5 SENSITIVE Sensitive     TRIMETH/SULFA <=10 SENSITIVE Sensitive     CLINDAMYCIN <=0.25 SENSITIVE Sensitive     RIFAMPIN <=0.5 SENSITIVE Sensitive     Inducible Clindamycin NEGATIVE Sensitive     PENICILLIN Value in next row Sensitive      SENSITIVE0.06    * FEW STAPHYLOCOCCUS AUREUS  Surgical PCR screen     Status: Abnormal   Collection Time: 06/11/17  1:18 PM  Result Value Ref Range Status   MRSA, PCR NEGATIVE NEGATIVE Final   Staphylococcus aureus POSITIVE (A) NEGATIVE Final    Comment: (NOTE) The Xpert SA Assay (FDA approved for NASAL specimens in patients 36 years of age and older), is one component of a comprehensive surveillance program. It is not intended to diagnose infection nor to guide or monitor treatment.   Aerobic/Anaerobic Culture (surgical/deep wound)     Status: None (Preliminary result)   Collection Time: 06/11/17  3:21 PM  Result Value Ref Range Status   Specimen Description ABSCESS LEFT KNEE  Final   Special Requests NONE  Final   Gram Stain   Final    RARE WBC PRESENT, PREDOMINANTLY PMN RARE GRAM POSITIVE COCCI IN PAIRS    Culture   Final    FEW STAPHYLOCOCCUS AUREUS NO ANAEROBES ISOLATED; CULTURE IN PROGRESS FOR 5 DAYS    Report Status PENDING  Incomplete   Organism ID, Bacteria STAPHYLOCOCCUS AUREUS  Final      Susceptibility   Staphylococcus aureus - MIC*    CIPROFLOXACIN <=0.5 SENSITIVE Sensitive     ERYTHROMYCIN <=0.25 SENSITIVE Sensitive     GENTAMICIN <=0.5 SENSITIVE  Sensitive     OXACILLIN <=0.25 SENSITIVE Sensitive     TETRACYCLINE <=1 SENSITIVE Sensitive     VANCOMYCIN <=0.5 SENSITIVE Sensitive     TRIMETH/SULFA <=10 SENSITIVE Sensitive     CLINDAMYCIN <=0.25 SENSITIVE Sensitive     RIFAMPIN <=0.5 SENSITIVE Sensitive     Inducible Clindamycin NEGATIVE Sensitive     * FEW STAPHYLOCOCCUS AUREUS  Culture, blood (routine x 2)     Status: Abnormal   Collection Time: 06/13/17 11:59 AM  Result Value Ref Range Status   Specimen Description BLOOD LEFT ANTECUBITAL  Final   Special Requests IN PEDIATRIC BOTTLE Blood Culture adequate volume  Final   Culture  Setup Time   Final    GRAM POSITIVE COCCI IN CLUSTERS IN PEDIATRIC BOTTLE CRITICAL VALUE NOTED.  VALUE IS CONSISTENT WITH PREVIOUSLY REPORTED AND CALLED VALUE.    Culture (A)  Final    STAPHYLOCOCCUS AUREUS SUSCEPTIBILITIES PERFORMED ON PREVIOUS CULTURE WITHIN THE LAST 5 DAYS.    Report Status 06/15/2017 FINAL  Final  Culture, blood (routine x 2)     Status: Abnormal   Collection Time: 06/13/17 12:07 PM  Result Value Ref Range Status   Specimen Description BLOOD LEFT ANTECUBITAL  Final   Special Requests IN PEDIATRIC BOTTLE Blood Culture adequate volume  Final   Culture  Setup Time   Final    GRAM POSITIVE COCCI IN CLUSTERS IN PEDIATRIC BOTTLE CRITICAL RESULT CALLED TO, READ BACK BY AND VERIFIED WITH: T EGAN,PHARMD AT [id number] 06/14/17 BY L BENFIELD    Culture (A)  Final    STAPHYLOCOCCUS AUREUS SUSCEPTIBILITIES PERFORMED ON PREVIOUS CULTURE WITHIN THE LAST 5  DAYS.    Report Status 06/15/2017 FINAL  Final  Culture, blood (routine x 2)     Status: None (Preliminary result)   Collection Time: 06/14/17  9:20 AM  Result Value Ref Range Status   Specimen Description BLOOD RIGHT HAND  Final   Special Requests IN PEDIATRIC BOTTLE Blood Culture adequate volume  Final   Culture  Setup Time   Final    GRAM POSITIVE COCCI IN CLUSTERS IN PEDIATRIC BOTTLE CRITICAL RESULT CALLED TO, READ BACK BY AND  VERIFIED WITH: K HYATT,PHARMD AT 0723 06/15/17 BY L BENFIELD    Culture GRAM POSITIVE COCCI  Final   Report Status PENDING  Incomplete         Radiology Studies: No results found.      Scheduled Meds: . feeding supplement  1 Container Oral TID BM  . furosemide  40 mg Intravenous Q12H  . Influenza vac split quadrivalent PF  0.5 mL Intramuscular Tomorrow-1000  . pantoprazole  40 mg Oral Q1200  . potassium chloride  40 mEq Oral BID  . thiamine  100 mg Oral Daily   Continuous Infusions: . sodium chloride    . nafcillin IV 2 g (06/15/17 0845)     LOS: 6 days    Time spent:    Zannie Cove, MD [facility] Page via [email or web address], password [email or web address] After 7PM please contact night-coverage  06/15/2017, 10:40 AM

## 2017-06-16 LAB — CBC
HEMATOCRIT: 28.5 % — AB (ref 39.0–52.0)
Hemoglobin: 9.4 g/dL — ABNORMAL LOW (ref 13.0–17.0)
MCH: 27.9 pg (ref 26.0–34.0)
MCHC: 33 g/dL (ref 30.0–36.0)
MCV: 84.6 fL (ref 78.0–100.0)
Platelets: 319 10*3/uL (ref 150–400)
RBC: 3.37 MIL/uL — ABNORMAL LOW (ref 4.22–5.81)
RDW: 15.1 % (ref 11.5–15.5)
WBC: 16 10*3/uL — ABNORMAL HIGH (ref 4.0–10.5)

## 2017-06-16 LAB — AEROBIC/ANAEROBIC CULTURE (SURGICAL/DEEP WOUND)

## 2017-06-16 LAB — GLUCOSE, CAPILLARY
GLUCOSE-CAPILLARY: 112 mg/dL — AB (ref 65–99)
GLUCOSE-CAPILLARY: 119 mg/dL — AB (ref 65–99)
Glucose-Capillary: 111 mg/dL — ABNORMAL HIGH (ref 65–99)
Glucose-Capillary: 141 mg/dL — ABNORMAL HIGH (ref 65–99)
Glucose-Capillary: 141 mg/dL — ABNORMAL HIGH (ref 65–99)
Glucose-Capillary: 177 mg/dL — ABNORMAL HIGH (ref 65–99)

## 2017-06-16 LAB — BASIC METABOLIC PANEL
Anion gap: 10 (ref 5–15)
BUN: 16 mg/dL (ref 6–20)
CALCIUM: 7.5 mg/dL — AB (ref 8.9–10.3)
CO2: 23 mmol/L (ref 22–32)
CREATININE: 1.37 mg/dL — AB (ref 0.61–1.24)
Chloride: 100 mmol/L — ABNORMAL LOW (ref 101–111)
GFR calc Af Amer: >60 mL/min (ref >60)
GFR calc non Af Amer: 58 mL/min — ABNORMAL LOW (ref >60)
GLUCOSE: 122 mg/dL — AB (ref 65–99)
Potassium: 3.3 mmol/L — ABNORMAL LOW (ref 3.5–5.1)
Sodium: 133 mmol/L — ABNORMAL LOW (ref 135–145)

## 2017-06-16 LAB — AEROBIC/ANAEROBIC CULTURE W GRAM STAIN (SURGICAL/DEEP WOUND)

## 2017-06-16 LAB — CULTURE, BLOOD (ROUTINE X 2): Special Requests: ADEQUATE

## 2017-06-16 MED ORDER — POTASSIUM CHLORIDE CRYS ER 20 MEQ PO TBCR
40.0000 meq | EXTENDED_RELEASE_TABLET | Freq: Once | ORAL | Status: AC
  Administered 2017-06-16: 40 meq via ORAL
  Filled 2017-06-16: qty 2

## 2017-06-16 MED ORDER — DEXTROSE 50 % IV SOLN
INTRAVENOUS | Status: AC
  Filled 2017-06-16: qty 50

## 2017-06-16 MED ORDER — ENOXAPARIN SODIUM 40 MG/0.4ML ~~LOC~~ SOLN
40.0000 mg | SUBCUTANEOUS | Status: DC
  Administered 2017-06-16 – 2017-06-22 (×7): 40 mg via SUBCUTANEOUS
  Filled 2017-06-16 (×7): qty 0.4

## 2017-06-16 MED ORDER — FUROSEMIDE 10 MG/ML IJ SOLN
20.0000 mg | Freq: Two times a day (BID) | INTRAMUSCULAR | Status: DC
  Administered 2017-06-16 (×2): 20 mg via INTRAVENOUS
  Filled 2017-06-16 (×2): qty 2

## 2017-06-16 NOTE — Progress Notes (Signed)
Patient ID: Marcus [name], male   DOB: 1965/12/15, 52 y.o.   MRN: [id number]          [facility]  Date of Admission:  06/08/2017           Day 8 nafcillin ASSESSMENT: He is improving slowly on therapy for MSSA mitral valve endocarditis and widely disseminated infection. He needs 6 weeks of IV antibiotic therapy.  He can be treated with nafcillin or penicillin if it is cheaper.  PLAN: 1. Continue nafcillin for 6 weeks through 07/19/2017 2. I will sign off now  Diagnosis: Bacteremia and endocarditis  Culture Result: MSSA  No Known Allergies  OPAT Orders Discharge antibiotics: Per pharmacy protocol nafcillin  Duration: 6 weeks End Date: 07/19/2017  [facility] Care Per Protocol:  Labs weekly while on IV antibiotics: _x_ CBC with differential _x_ BMP __ CMP __ CRP __ ESR __ Vancomycin trough  _x_ Please pull PIC at completion of IV antibiotics __ Please leave PIC in place until doctor has seen patient or been notified  Fax weekly labs to [phone]  Clinic Follow Up Appt: 07/17/2017  Principal Problem:   Bacteremia due to methicillin susceptible Staphylococcus aureus (MSSA) Active Problems:   Meningitis due to bacteria   Septic embolism ([place])   Endocarditis of mitral valve   Septic arthritis of knee, left (HCC)   Hepatitis C antibody test positive   Encephalopathy   Sepsis ([place])   AKI (acute kidney injury) ([place])   Elevated liver enzymes   Normocytic anemia   Thrombocytopenia (HCC)   Polysubstance abuse (HCC)   Poor dentition   Acute bacterial endocarditis   Altered mental status   Hyperkalemia   Scheduled Meds: . enoxaparin (LOVENOX) injection  40 mg Subcutaneous Q24H  . feeding supplement  1 Container Oral TID BM  . furosemide  20 mg Intravenous Q12H  . Influenza vac split quadrivalent PF  0.5 mL Intramuscular Tomorrow-1000  . pantoprazole  40 mg Oral Q1200  . potassium chloride  40 mEq Oral BID  . potassium chloride  40 mEq  Oral Once  . senna-docusate  1 tablet Oral BID  . thiamine  100 mg Oral Daily   Continuous Infusions: . sodium chloride    . nafcillin IV Stopped (06/16/17 1011)   PRN Meds:.sodium chloride, acetaminophen, morphine injection, polyethylene glycol   SUBJECTIVE: He states that he is feeling better.  He admits to a problem with polysubstance abuse and addiction but denies any injecting drug use.  Review of Systems: Review of Systems  Unable to perform ROS: Mental acuity    No Known Allergies  OBJECTIVE: Vitals:   06/15/17 2110 06/16/17 0500 06/16/17 0534 06/16/17 1252  BP: 119/60  101/62 (!) 108/55  Pulse: (!) 117  (!) 107 (!) 108  Resp: _0 Temp: 100.1 F (37.8 C)  99.4 F (37.4 C) 99.6 F (37.6 C)  TempSrc: Oral  Oral Oral  SpO2: 100%  100% 99%  Weight:  176 lb 9.4 oz (80.1 kg)    Height:       Body mass index is 24.63 kg/m.  Physical Exam  Constitutional: He is oriented to person, place, and time.  He is alert and in no distress reclining in a chair.  HENT:  Mouth/Throat: No oropharyngeal exudate.  Poor dentition.  Cardiovascular: Normal rate and regular rhythm.  No murmur heard. Pulmonary/Chest: Effort normal. He has no wheezes. He has no rales.  Abdominal: Soft. There is no  tenderness.  Musculoskeletal:  Left knee less swollen.  Neurological: He is alert and oriented to person, place, and time.    Lab Results Lab Results  Component Value Date   WBC 16.0 (H) 06/16/2017   HGB 9.4 (L) 06/16/2017   HCT 28.5 (L) 06/16/2017   MCV 84.6 06/16/2017   PLT 319 06/16/2017    Lab Results  Component Value Date   CREATININE 1.37 (H) 06/16/2017   BUN 16 06/16/2017   NA 133 (L) 06/16/2017   K 3.3 (L) 06/16/2017   CL 100 (L) 06/16/2017   CO2 23 06/16/2017    Lab Results  Component Value Date   ALT 101 (H) 06/09/2017   AST 204 (H) 06/09/2017   ALKPHOS 92 06/09/2017   BILITOT 1.1 06/09/2017     Microbiology: Recent Results (from the past 240  hour(s))  Culture, blood (routine x 2)     Status: Abnormal   Collection Time: 06/08/17  5:30 PM  Result Value Ref Range Status   Specimen Description BLOOD LEFT ARM  Final   Special Requests IN PEDIATRIC BOTTLE Blood Culture adequate volume  Final   Culture  Setup Time   Final    GRAM POSITIVE COCCI IN CLUSTERS IN PEDIATRIC BOTTLE CRITICAL VALUE NOTED.  VALUE IS CONSISTENT WITH PREVIOUSLY REPORTED AND CALLED VALUE.    Culture (A)  Final    STAPHYLOCOCCUS AUREUS SUSCEPTIBILITIES PERFORMED ON PREVIOUS CULTURE WITHIN THE LAST 5 DAYS.    Report Status 06/11/2017 FINAL  Final  Culture, blood (routine x 2)     Status: Abnormal   Collection Time: 06/08/17  5:46 PM  Result Value Ref Range Status   Specimen Description BLOOD RIGHT ANTECUBITAL  Final   Special Requests   Final    BOTTLES DRAWN AEROBIC AND ANAEROBIC Blood Culture adequate volume   Culture  Setup Time   Final    IN BOTH AEROBIC AND ANAEROBIC BOTTLES GRAM POSITIVE COCCI IN CLUSTERS CRITICAL RESULT CALLED TO, READ BACK BY AND VERIFIED WITH: J.LEDFORD,PHARMD [id number] 06/09/17 M.Peder Allums    Culture STAPHYLOCOCCUS AUREUS (A)  Final   Report Status 06/11/2017 FINAL  Final   Organism ID, Bacteria STAPHYLOCOCCUS AUREUS  Final      Susceptibility   Staphylococcus aureus - MIC*    CIPROFLOXACIN <=0.5 SENSITIVE Sensitive     ERYTHROMYCIN <=0.25 SENSITIVE Sensitive     GENTAMICIN <=0.5 SENSITIVE Sensitive     OXACILLIN <=0.25 SENSITIVE Sensitive     TETRACYCLINE <=1 SENSITIVE Sensitive     VANCOMYCIN <=0.5 SENSITIVE Sensitive     TRIMETH/SULFA <=10 SENSITIVE Sensitive     CLINDAMYCIN <=0.25 SENSITIVE Sensitive     RIFAMPIN <=0.5 SENSITIVE Sensitive     Inducible Clindamycin NEGATIVE Sensitive     * STAPHYLOCOCCUS AUREUS  Blood Culture ID Panel (Reflexed)     Status: Abnormal   Collection Time: 06/08/17  5:46 PM  Result Value Ref Range Status   Enterococcus species NOT DETECTED NOT DETECTED Final   Listeria monocytogenes NOT  DETECTED NOT DETECTED Final   Staphylococcus species DETECTED (A) NOT DETECTED Final    Comment: CRITICAL RESULT CALLED TO, READ BACK BY AND VERIFIED WITH: J.LEDFORD,PHARMD [id number] 06/09/17 M.Dabria Wadas    Staphylococcus aureus DETECTED (A) NOT DETECTED Final    Comment: Methicillin (oxacillin) susceptible Staphylococcus aureus (MSSA). Preferred therapy is anti staphylococcal beta lactam antibiotic (Cefazolin or Nafcillin), unless clinically contraindicated. CRITICAL RESULT CALLED TO, READ BACK BY AND VERIFIED WITH: J.LEDFORD,PHARMD 0630 06/09/17 M.Janeece Blok    Methicillin resistance NOT DETECTED  NOT DETECTED Final   Streptococcus species NOT DETECTED NOT DETECTED Final   Streptococcus agalactiae NOT DETECTED NOT DETECTED Final   Streptococcus pneumoniae NOT DETECTED NOT DETECTED Final   Streptococcus pyogenes NOT DETECTED NOT DETECTED Final   Acinetobacter baumannii NOT DETECTED NOT DETECTED Final   Enterobacteriaceae species NOT DETECTED NOT DETECTED Final   Enterobacter cloacae complex NOT DETECTED NOT DETECTED Final   Escherichia coli NOT DETECTED NOT DETECTED Final   Klebsiella oxytoca NOT DETECTED NOT DETECTED Final   Klebsiella pneumoniae NOT DETECTED NOT DETECTED Final   Proteus species NOT DETECTED NOT DETECTED Final   Serratia marcescens NOT DETECTED NOT DETECTED Final   Haemophilus influenzae NOT DETECTED NOT DETECTED Final   Neisseria meningitidis NOT DETECTED NOT DETECTED Final   Pseudomonas aeruginosa NOT DETECTED NOT DETECTED Final   Candida albicans NOT DETECTED NOT DETECTED Final   Candida glabrata NOT DETECTED NOT DETECTED Final   Candida krusei NOT DETECTED NOT DETECTED Final   Candida parapsilosis NOT DETECTED NOT DETECTED Final   Candida tropicalis NOT DETECTED NOT DETECTED Final  Urine culture     Status: Abnormal   Collection Time: 06/08/17  7:46 PM  Result Value Ref Range Status   Specimen Description URINE, CLEAN CATCH  Final   Special Requests Normal  Final    Culture >=100,000 COLONIES/mL STAPHYLOCOCCUS AUREUS (A)  Final   Report Status 06/11/2017 FINAL  Final   Organism ID, Bacteria STAPHYLOCOCCUS AUREUS (A)  Final      Susceptibility   Staphylococcus aureus - MIC*    CIPROFLOXACIN <=0.5 SENSITIVE Sensitive     GENTAMICIN <=0.5 SENSITIVE Sensitive     NITROFURANTOIN 32 SENSITIVE Sensitive     OXACILLIN <=0.25 SENSITIVE Sensitive     TETRACYCLINE <=1 SENSITIVE Sensitive     VANCOMYCIN 1 SENSITIVE Sensitive     TRIMETH/SULFA <=10 SENSITIVE Sensitive     CLINDAMYCIN <=0.25 SENSITIVE Sensitive     RIFAMPIN <=0.5 SENSITIVE Sensitive     Inducible Clindamycin NEGATIVE Sensitive     * >=100,000 COLONIES/mL STAPHYLOCOCCUS AUREUS  Respiratory Panel by PCR     Status: None   Collection Time: 06/08/17  7:46 PM  Result Value Ref Range Status   Adenovirus NOT DETECTED NOT DETECTED Final   Coronavirus 229E NOT DETECTED NOT DETECTED Final   Coronavirus HKU1 NOT DETECTED NOT DETECTED Final   Coronavirus NL63 NOT DETECTED NOT DETECTED Final   Coronavirus OC43 NOT DETECTED NOT DETECTED Final   Metapneumovirus NOT DETECTED NOT DETECTED Final   Rhinovirus / Enterovirus NOT DETECTED NOT DETECTED Final   Influenza A NOT DETECTED NOT DETECTED Final   Influenza B NOT DETECTED NOT DETECTED Final   Parainfluenza Virus 1 NOT DETECTED NOT DETECTED Final   Parainfluenza Virus 2 NOT DETECTED NOT DETECTED Final   Parainfluenza Virus 3 NOT DETECTED NOT DETECTED Final   Parainfluenza Virus 4 NOT DETECTED NOT DETECTED Final   Respiratory Syncytial Virus NOT DETECTED NOT DETECTED Final   Bordetella pertussis NOT DETECTED NOT DETECTED Final   Chlamydophila pneumoniae NOT DETECTED NOT DETECTED Final   Mycoplasma pneumoniae NOT DETECTED NOT DETECTED Final  CSF culture     Status: None   Collection Time: 06/08/17 11:00 PM  Result Value Ref Range Status   Specimen Description CSF  Final   Special Requests Normal  Final   Culture NO GROWTH 3 DAYS  Final   Report Status  06/12/2017 FINAL  Final  Gram stain     Status: None   Collection  Time: 06/08/17 11:00 PM  Result Value Ref Range Status   Specimen Description CSF  Final   Special Requests Normal  Final   Gram Stain   Final    CYTOSPIN SLIDE WBC PRESENT,BOTH PMN AND MONONUCLEAR NO ORGANISMS SEEN    Report Status 06/08/2017 FINAL  Final  Culture, blood (single)     Status: None   Collection Time: 06/08/17 11:30 PM  Result Value Ref Range Status   Specimen Description BLOOD LEFT FOREARM  Final   Special Requests   Final    BOTTLES DRAWN AEROBIC AND ANAEROBIC Blood Culture adequate volume   Culture NO GROWTH 5 DAYS  Final   Report Status 06/14/2017 FINAL  Final  MRSA PCR Screening     Status: None   Collection Time: 06/09/17  3:15 AM  Result Value Ref Range Status   MRSA by PCR NEGATIVE NEGATIVE Final    Comment:        The GeneXpert MRSA Assay (FDA approved for NASAL specimens only), is one component of a comprehensive MRSA colonization surveillance program. It is not intended to diagnose MRSA infection nor to guide or monitor treatment for MRSA infections.   Body fluid culture     Status: None   Collection Time: 06/10/17  1:13 PM  Result Value Ref Range Status   Specimen Description SYNOVIAL LEFT KNEE  Final   Special Requests Normal  Final   Gram Stain   Final    FEW WBC PRESENT, PREDOMINANTLY PMN RARE GRAM POSITIVE COCCI IN PAIRS    Culture FEW STAPHYLOCOCCUS AUREUS  Final   Report Status 06/13/2017 FINAL  Final   Organism ID, Bacteria STAPHYLOCOCCUS AUREUS  Final      Susceptibility   Staphylococcus aureus - MIC*    CIPROFLOXACIN <=0.5 SENSITIVE Sensitive     ERYTHROMYCIN <=0.25 SENSITIVE Sensitive     GENTAMICIN <=0.5 SENSITIVE Sensitive     OXACILLIN <=0.25 SENSITIVE Sensitive     TETRACYCLINE <=1 SENSITIVE Sensitive     VANCOMYCIN <=0.5 SENSITIVE Sensitive     TRIMETH/SULFA <=10 SENSITIVE Sensitive     CLINDAMYCIN <=0.25 SENSITIVE Sensitive     RIFAMPIN <=0.5 SENSITIVE  Sensitive     Inducible Clindamycin NEGATIVE Sensitive     PENICILLIN Value in next row Sensitive      SENSITIVE0.06    * FEW STAPHYLOCOCCUS AUREUS  Surgical PCR screen     Status: Abnormal   Collection Time: 06/11/17  1:18 PM  Result Value Ref Range Status   MRSA, PCR NEGATIVE NEGATIVE Final   Staphylococcus aureus POSITIVE (A) NEGATIVE Final    Comment: (NOTE) The Xpert SA Assay (FDA approved for NASAL specimens in patients 35 years of age and older), is one component of a comprehensive surveillance program. It is not intended to diagnose infection nor to guide or monitor treatment.   Aerobic/Anaerobic Culture (surgical/deep wound)     Status: None (Preliminary result)   Collection Time: 06/11/17  3:21 PM  Result Value Ref Range Status   Specimen Description ABSCESS LEFT KNEE  Final   Special Requests NONE  Final   Gram Stain   Final    RARE WBC PRESENT, PREDOMINANTLY PMN RARE GRAM POSITIVE COCCI IN PAIRS    Culture   Final    FEW STAPHYLOCOCCUS AUREUS NO ANAEROBES ISOLATED; CULTURE IN PROGRESS FOR 5 DAYS    Report Status PENDING  Incomplete   Organism ID, Bacteria STAPHYLOCOCCUS AUREUS  Final      Susceptibility   Staphylococcus aureus -  MIC*    CIPROFLOXACIN <=0.5 SENSITIVE Sensitive     ERYTHROMYCIN <=0.25 SENSITIVE Sensitive     GENTAMICIN <=0.5 SENSITIVE Sensitive     OXACILLIN <=0.25 SENSITIVE Sensitive     TETRACYCLINE <=1 SENSITIVE Sensitive     VANCOMYCIN <=0.5 SENSITIVE Sensitive     TRIMETH/SULFA <=10 SENSITIVE Sensitive     CLINDAMYCIN <=0.25 SENSITIVE Sensitive     RIFAMPIN <=0.5 SENSITIVE Sensitive     Inducible Clindamycin NEGATIVE Sensitive     * FEW STAPHYLOCOCCUS AUREUS  Culture, blood (routine x 2)     Status: Abnormal   Collection Time: 06/13/17 11:59 AM  Result Value Ref Range Status   Specimen Description BLOOD LEFT ANTECUBITAL  Final   Special Requests IN PEDIATRIC BOTTLE Blood Culture adequate volume  Final   Culture  Setup Time   Final     GRAM POSITIVE COCCI IN CLUSTERS IN PEDIATRIC BOTTLE CRITICAL VALUE NOTED.  VALUE IS CONSISTENT WITH PREVIOUSLY REPORTED AND CALLED VALUE.    Culture (A)  Final    STAPHYLOCOCCUS AUREUS SUSCEPTIBILITIES PERFORMED ON PREVIOUS CULTURE WITHIN THE LAST 5 DAYS.    Report Status 06/15/2017 FINAL  Final  Culture, blood (routine x 2)     Status: Abnormal   Collection Time: 06/13/17 12:07 PM  Result Value Ref Range Status   Specimen Description BLOOD LEFT ANTECUBITAL  Final   Special Requests IN PEDIATRIC BOTTLE Blood Culture adequate volume  Final   Culture  Setup Time   Final    GRAM POSITIVE COCCI IN CLUSTERS IN PEDIATRIC BOTTLE CRITICAL RESULT CALLED TO, READ BACK BY AND VERIFIED WITH: T EGAN,PHARMD AT 4403 06/14/17 BY L BENFIELD    Culture (A)  Final    STAPHYLOCOCCUS AUREUS SUSCEPTIBILITIES PERFORMED ON PREVIOUS CULTURE WITHIN THE LAST 5 DAYS.    Report Status 06/15/2017 FINAL  Final  Culture, blood (routine x 2)     Status: None (Preliminary result)   Collection Time: 06/14/17  9:10 AM  Result Value Ref Range Status   Specimen Description BLOOD RIGHT ARM  Final   Special Requests   Final    BOTTLES DRAWN AEROBIC ONLY Blood Culture adequate volume   Culture NO GROWTH 1 DAY  Final   Report Status PENDING  Incomplete  Culture, blood (routine x 2)     Status: Abnormal   Collection Time: 06/14/17  9:20 AM  Result Value Ref Range Status   Specimen Description BLOOD RIGHT HAND  Final   Special Requests IN PEDIATRIC BOTTLE Blood Culture adequate volume  Final   Culture  Setup Time   Final    GRAM POSITIVE COCCI IN CLUSTERS IN PEDIATRIC BOTTLE CRITICAL RESULT CALLED TO, READ BACK BY AND VERIFIED WITH: K HYATT,PHARMD AT 0723 06/15/17 BY L BENFIELD    Culture (A)  Final    STAPHYLOCOCCUS AUREUS SUSCEPTIBILITIES PERFORMED ON PREVIOUS CULTURE WITHIN THE LAST 5 DAYS.    Report Status 06/16/2017 FINAL  Final    Michel Bickers, MD [facility] [place] [facility] [phone] pager   [phone] cell 06/16/2017, 1:23 PM

## 2017-06-16 NOTE — Progress Notes (Signed)
Initial Nutrition Assessment  DOCUMENTATION CODES:   Non-severe (moderate) malnutrition in context of chronic illness  INTERVENTION:  Ensure Enlive po TID, each supplement provides 350 kcal and 20 grams of protein  Patient may require nutrition support if unable to improve PO intake  NUTRITION DIAGNOSIS:   Moderate Malnutrition related to chronic illness as evidenced by moderate fat depletion, moderate muscle depletion  GOAL:   Patient will meet greater than or equal to 90% of their needs  MONITOR:   PO intake, I & O's, Labs, Supplement acceptance, Weight trends  REASON FOR ASSESSMENT:   Low Braden    ASSESSMENT:   Briefly Mr. Rana SnareLowe is a 52 year old male with history of polysubstance abuse and hep C was found down by his significant other, subsequently brought to the emergency room, he was obtunded and found to have severe metabolic encephalopathy, infective endocarditis, MSSA bacteremia, septic emboli/embolic strokes and left knee septic arthritis, s/p I&D of L knee on 1/9   Spoke with Mr. Champine at bedside. He can respond appropriately to some questions, but has trouble recalling information. He states a UBW of 170 pounds, and believes he has gained weight since he has been admitted. Per chart his weight has fluctuated between 154 pounds to 194 pounds since he was admitted.  Feels he does better with soft foods. Now on NDD1 - thin liquids but feels better about consuming ensure and other drinks. Meal completion has been pretty poor, 03-23-39% Unable to recall what he ate for breakfast this morning. Was waiting on lunch during time of visit. Unable to recall usual PO intake at home.  Labs reviewed Medications reviewed and include:  40K+ BID, Senokot-S, Thiamine,     NUTRITION - FOCUSED PHYSICAL EXAM:    Most Recent Value  Orbital Region  Moderate depletion  Upper Arm Region  Moderate depletion  Thoracic and Lumbar Region  Moderate depletion  Buccal Region  Moderate  depletion  Temple Region  Moderate depletion  Clavicle Bone Region  Moderate depletion  Clavicle and Acromion Bone Region  Moderate depletion  Scapular Bone Region  Moderate depletion  Dorsal Hand  Moderate depletion  Patellar Region  Moderate depletion  Anterior Thigh Region  Moderate depletion  Posterior Calf Region  Moderate depletion  Edema (RD Assessment)  Moderate  Hair  Reviewed  Eyes  Reviewed  Mouth  Reviewed  Skin  Reviewed  Nails  Reviewed       Diet Order:  DIET - DYS 1 Room service appropriate? Yes; Fluid consistency: Thin  EDUCATION NEEDS:   Education needs have been addressed  Skin:  Skin Assessment: Skin Integrity Issues: Skin Integrity Issues:: Stage I, Other (Comment) Stage I: Pressure ulcer to sacrum Other: ecchymosis to back and leg  Last BM:  06/14/2016  Height:   Ht Readings from Last 1 Encounters:  06/12/17 5\' 11"  (1.803 m)    Weight:   Wt Readings from Last 1 Encounters:  06/16/17 176 lb 9.4 oz (80.1 kg)    Ideal Body Weight:  78.18 kg  BMI:  Body mass index is 24.63 kg/m.  Estimated Nutritional Needs:   Kcal:  2200-2350 calories (MSJ x1.3-1.4)  Protein:  112-128 grams (1.4-1.6g/kg)  Fluid:  2.2-2.4L   Dionne AnoWilliam M. Azya Barbero, MS, RD LDN Inpatient Clinical Dietitian Pager [phone]

## 2017-06-16 NOTE — Progress Notes (Signed)
According to the supply rep there are no B PRAFO splints in stock so we will order boots for now and follow up tomorrow.

## 2017-06-16 NOTE — Progress Notes (Signed)
CSW has [facility] for [facility]- awaiting determination if these would be more cost efficient than nafcillin  CSW will continue to follow  Burna SisJenna H. Demetria Iwai, LCSW Clinical Social Worker [phone]

## 2017-06-16 NOTE — Progress Notes (Signed)
  Speech Language Pathology Treatment: Dysphagia  Patient Details Name: Marcus [name] MRN: [id number] DOB: 02/14/1966 Today's Date: 06/16/2017 Time: [id number] SLP Time Calculation (min) (ACUTE ONLY): 19 min  Assessment / Plan / Recommendation Clinical Impression  Pt consumed limited amounts of POs today, saying that he did not have an appetite for anything. No overt signs of aspiration were observed, although he did have brief increase in respiratory rate after consuming larger, consecutive straw sips. Clinically he appears to be tolerating his current diet. Although oral preparation and clearance was good with few bites of soft solids, his mom says that he has been spitting out his food if there are any larger pieces mixed into the puree. Would continue current diet and precautions for now but with additional SLP f/u indicated. SLP also paged MD requesting a cognitive-linguistic evaluation as pt has acute/subacute infarcts this admission and his mother is concerned about cognitive changes.   HPI HPI: This is a 52 yo man with no notable past medical history, here with altered mental status. Hsitory obtained from EDP. No family at bedside. Pt not following commands but winces to pain.  Per chart review, pt was last seen normal by his girlfriend at 12:00, and later she found him on the ground, on his right side, unresponsive. He was tachycardic in ED. He has history fo opiate use- UDS was positive for cocaine and opiates and benzo.  MRI is showing numerous small foci of acute/early sub-acute infarct in right greater then left posterior hemipheres, basal ganglia, brainstem and cerebellum.  Chest xray is showing no active disease.        SLP Plan  Continue with current plan of care       Recommendations  Diet recommendations: Dysphagia 1 (puree);Thin liquid Liquids provided via: Cup;Straw Medication Administration: Whole meds with puree Supervision: Staff to assist with self feeding;Full  supervision/cueing for compensatory strategies Compensations: Minimize environmental distractions;Slow rate;Small sips/bites;Lingual sweep for clearance of pocketing Postural Changes and/or Swallow Maneuvers: Seated upright 90 degrees                Oral Care Recommendations: Oral care BID Follow up Recommendations: Skilled Nursing facility SLP Visit Diagnosis: Dysphagia, oropharyngeal phase (R13.12) Plan: Continue with current plan of care       GO                [name], [name] 06/16/2017, 4:11 PM  [name], M.A. CCC-SLP [phone]

## 2017-06-16 NOTE — Progress Notes (Signed)
OT Evaluation  PTA, pt apparently independent with ADL and mobility. Pt with significant functional change and requires total A+2 for mobility (unable to transfer at this time - required use of maximove) and total to max A with ADL tasks due to below deficits. Pt will require extensive rehab at SNF to maximize his functional level of independence. Will follow acutely to address established goals. Recommend B PRAFO splints to properly position B feet.    06/16/17 1200  OT Visit Information  Assistance Needed +2  Reason Eval/Treat Not Completed Patient declined, no reason specified  History of Present Illness Pt is a 52 y/o male with history of polysubstance abuse and hep C was found down by his significant other, subsequently brought to the emergency room, he was obtunded and found to have severe metabolic encephalopathy, infective endocarditis, MSSA bacteremia, septic emboli/embolic strokes and left knee septic arthritis. Pt underwent left knee I & D on 1/9.  Precautions  Precautions Fall  Restrictions  Weight Bearing Restrictions Yes  LLE Weight Bearing WBAT  Home Living  Family/patient expects to be discharged to: Skilled nursing facility  Living Arrangements Alone  Prior Function  Level of Independence Independent  Comments unsure if pt lived with his significant other; has adult children  Communication  Communication No difficulties  Pain Assessment  Pain Assessment Faces  Faces Pain Scale 8  Pain Location bil knees; back  Pain Descriptors / Indicators Sore;Grimacing;Guarding;Moaning  Pain Intervention(s) Limited activity within patient's tolerance  Cognition  Arousal/Alertness Awake/alert  Behavior During Therapy Anxious  Overall Cognitive Status Impaired/Different from baseline  Area of Impairment Orientation;Problem solving;Following commands;Safety/judgement;Attention;Awareness  Orientation Level Disoriented to;Situation;Time  Current Attention Level Selective  Following  Commands Follows one step commands inconsistently;Follows one step commands with increased time (multi modal cues and repetition)  Safety/Judgement Decreased awareness of deficits  Awareness Emergent  Problem Solving Difficulty sequencing;Requires verbal cues;Requires tactile cues;Slow processing;Decreased initiation  General Comments Requires repetition of cues to perform any mobility task; slow processing/response time. Fearful of movement and anticipatory pain.   Upper Extremity Assessment  Upper Extremity Assessment RUE deficits/detail;LUE deficits/detail  RUE Deficits / Details genealized weakness; attmepts to use RUE at times; able to make gross grasp and release; difficulty with in hand manipulation; does not have tohe strength to push his call bell  LUE Deficits / Details minimal movemetn of edematous L hand; unable to make compsotie flist; not using as functional assist; Brunstrom level 1  LUE Sensation decreased light touch;decreased proprioception  LUE Coordination decreased fine motor;decreased gross motor  Lower Extremity Assessment  Lower Extremity Assessment Defer to PT evaluation  RLE Deficits / Details pt did not demonstrate any active movement throughout; reported sensation was grossly intact to light touch  LLE Deficits / Details pt did not demonstrate any active movement throughout; reported sensation was grossly intact to light touch; ACE wrap and dressing in place around L knee with some edema noted in lower leg and foot  LLE Unable to fully assess due to pain  Cervical / Trunk Assessment  Cervical / Trunk Assessment Kyphotic (forward head)  ADL  Overall ADL's  Needs assistance/impaired  Eating/Feeding Maximal assistance  Eating/Feeding Details (indicate cue type and reason) modified diet  Grooming Maximal assistance  Upper Body Bathing Maximal assistance;Sitting  Lower Body Bathing Total assistance;Bed level  Upper Body Dressing  Total assistance;Sitting  Lower Body  Dressing Total assistance;Bed level  General ADL Comments Maximove used to move to chair as pt not tolerating movement well due  to pain L knee and "everywhere"; Most likely with L inattention  Vision- Assessment  Vision Assessment? Vision impaired- to be further tested in functional context  Perception  Perception Tested? Yes  Perception Deficits Inattention/neglect  Inattention/Neglect Impaired- to be further tested in functional context  Comments will further assess  Praxis  Praxis tested? Deficits  Deficits Initiation  Praxis-Other Comments will further assess  Bed Mobility  Overal bed mobility Needs Assistance  Bed Mobility Rolling  Rolling Max assist;Total assist;+2 for physical assistance  General bed mobility comments Rolling to right/left multiple times for pericare and then to place maxi pad with assist for reaching over for rail, bending knees and with trunk. Guarded with all movement   Transfers  Overall transfer level Needs assistance  Transfer via Lift Equipment Maximove  General transfer comment Used maximove to transfer pt to chair with therapist supporting l.eft knee into extension for pain control.  Balance  Overall balance assessment Needs assistance (will further assess; poor awareness of leaning in chair)  OT - End of Session  Activity Tolerance Patient limited by pain  Patient left in chair;with call bell/phone within reach;with chair alarm set  Nurse Communication Mobility status;Need for lift equipment  OT Assessment  OT Recommendation/Assessment Patient needs continued OT Services  OT Visit Diagnosis Other abnormalities of gait and mobility (R26.89);Low vision, both eyes (H54.2);Muscle weakness (generalized) (M62.81);Other symptoms and signs involving cognitive function;Hemiplegia and hemiparesis;Pain  Hemiplegia - Right/Left Left  Hemiplegia - dominant/non-dominant Non-Dominant  Hemiplegia - caused by Cerebral infarction  Pain - Right/Left Left  Pain -  part of body Knee  OT Problem List Decreased strength;Decreased range of motion;Decreased activity tolerance;Impaired balance (sitting and/or standing);Impaired vision/perception;Decreased coordination;Decreased cognition;Decreased safety awareness;Decreased knowledge of use of DME or AE;Impaired tone;Impaired sensation;Impaired UE functional use;Pain;Increased edema  OT Plan  OT Frequency (ACUTE ONLY) Min 2X/week  OT Treatment/Interventions (ACUTE ONLY) Self-care/ADL training;Therapeutic exercise;Neuromuscular education;DME and/or AE instruction;Therapeutic activities;Splinting;Cognitive remediation/compensation;Visual/perceptual remediation/compensation;Patient/family education;Balance training  AM-PAC OT "6 Clicks" Daily Activity Outcome Measure  Help from another person eating meals? 2  Help from another person taking care of personal grooming? 2  Help from another person toileting, which includes using toliet, bedpan, or urinal? 1  Help from another person bathing (including washing, rinsing, drying)? 2  Help from another person to put on and taking off regular upper body clothing? 2  Help from another person to put on and taking off regular lower body clothing? 1  6 Click Score 10  ADL G Code Conversion CL  OT Recommendation  Follow Up Recommendations SNF;Supervision/Assistance - 24 hour  OT Equipment 3 in 1 bedside commode;Wheelchair (measurements OT);Wheelchair cushion (measurements OT);Hospital bed;Tub/shower bench  Individuals Consulted  Consulted and Agree with Results and Recommendations Patient unable/family or caregiver not available  Acute Rehab OT Goals  Patient Stated Goal talk with nurse about pain control  OT Goal Formulation Patient unable to participate in goal setting  Time For Goal Achievement 06/30/17  Potential to Achieve Goals Fair  OT Time Calculation  OT Start Time (ACUTE ONLY) 1035  OT Stop Time (ACUTE ONLY) 1106  OT Time Calculation (min) 31 min  OT General  Charges  $OT Visit 1 Visit  OT Evaluation  $OT Eval Moderate Complexity 1 Mod  Written Expression  Dominant Hand Right  [facility] Centerilary Albino Bufford, OT/L  [phone] 06/16/2017

## 2017-06-16 NOTE — Plan of Care (Signed)
  Progressing Education: Knowledge of disease or condition will improve 06/16/2017 0321 - Progressing by Burtis Junesrewery, Skipper Dacosta, RN Knowledge of secondary prevention will improve 06/16/2017 0321 - Progressing by Burtis Junesrewery, Darrel Gloss, RN Continues to have limited movement on left side extremities, pillows under all extremities to reduce swelling.  Redness to perineum. Incontinent of urine and stool, peri care done applied barrier cream, q2turns. Reapplied Condom catheter. Increased output since lasix administered.   Continues to complain of pain 8-9/10 morphine given prn.

## 2017-06-16 NOTE — Progress Notes (Signed)
Physical Therapy Treatment Patient Details Name: Marcus [name] MRN: [id number] DOB: 01/13/1966 Today's Date: 06/16/2017    History of Present Illness Pt is a 52 y/o male with history of polysubstance abuse and hep C was found down by his significant other, subsequently brought to the emergency room, he was obtunded and found to have severe metabolic encephalopathy, infective endocarditis, MSSA bacteremia, septic emboli/embolic strokes and left knee septic arthritis. Pt underwent left knee I & D on 1/9.    PT Comments    Patient more alert today but continues to be limited by pain with all mobility especially in Bil knees and through back. Tolerated rolling multiple times for pericare and to place lift pad. Tolerated transfer to chair using maximove with therapist supporting left knee in extension for transfer. Increased time to perform any tasks with repetition and multimodal cues needed. Tolerated some there ex at bed level to increase muscle activation of BLEs and decreased pain, swelling and stiffness. Poor tolerance to activity. Recommend PRAFOs to help decrease losing AROM of bil ankles and foot drop as pt has hx of foot drop on 1 LE. Also recommend soft call bell as pt with difficulty pushing call bell button. RN aware of both recommendations. Recommend using maxi move to transfer pt back to bed. Tech aware. Appropriate for SNF. Will follow.   Follow Up Recommendations  SNF;Supervision/Assistance - 24 hour     Equipment Recommendations  None recommended by PT    Recommendations for Other Services       Precautions / Restrictions Precautions Precautions: Fall Restrictions Weight Bearing Restrictions: Yes LLE Weight Bearing: Weight bearing as tolerated    Mobility  Bed Mobility Overal bed mobility: Needs Assistance Bed Mobility: Rolling Rolling: Max assist;Total assist;+2 for physical assistance         General bed mobility comments: Rolling to right/left multiple times for  pericare and then to place maxi pad with assist for reaching over for rail, bending knees and with trunk. Guarded with all movement   Transfers Overall transfer level: Needs assistance Equipment used: Ambulation equipment used             General transfer comment: Used maximove to transfer pt to chair with therapist supporting l.eft knee into extension for pain control.  Ambulation/Gait                 Stairs            Wheelchair Mobility    Modified Rankin (Stroke Patients Only)       Balance Overall balance assessment: No apparent balance deficits (not formally assessed)                                          Cognition Arousal/Alertness: Awake/alert   Overall Cognitive Status: Impaired/Different from baseline Area of Impairment: Orientation;Problem solving;Following commands;Safety/judgement                 Orientation Level: Disoriented to;Situation     Following Commands: Follows one step commands inconsistently;Follows one step commands with increased time(multi modal cues and repetition) Safety/Judgement: Decreased awareness of deficits   Problem Solving: Difficulty sequencing;Requires verbal cues;Requires tactile cues;Slow processing;Decreased initiation General Comments: Requires repetition of cues to perform any mobility task; slow processing/response time. Fearful of movement and anticipatory pain.       Exercises General Exercises - Lower Extremity Ankle Circles/Pumps: AAROM;PROM;5 reps;Supine Quad  Sets: Right;5 reps;Supine Heel Slides: AAROM;Both;5 reps;Supine    General Comments General comments (skin integrity, edema, etc.): left knee is swollen with palpable fluid in joint area; also noted no ankle activation on left, limited in right ankle. Reports hx of foot drop but does not remember which foot.      Pertinent Vitals/Pain Pain Assessment: Faces Faces Pain Scale: Hurts whole lot Pain Location: bil  knees; back Pain Descriptors / Indicators: Sore;Grimacing;Guarding;Moaning Pain Intervention(s): Monitored during session;Repositioned;Limited activity within patient's tolerance    Home Living                      Prior Function            PT Goals (current goals can now be found in the care plan section) Progress towards PT goals: Progressing toward goals(slowly)    Frequency    Min 2X/week      PT Plan Current plan remains appropriate    Co-evaluation PT/OT/SLP Co-Evaluation/Treatment: Yes Reason for Co-Treatment: For patient/therapist safety;To address functional/ADL transfers;Necessary to address cognition/behavior during functional activity PT goals addressed during session: Mobility/safety with mobility        AM-PAC PT "6 Clicks" Daily Activity  Outcome Measure  Difficulty turning over in bed (including adjusting bedclothes, sheets and blankets)?: Unable Difficulty moving from lying on back to sitting on the side of the bed? : Unable Difficulty sitting down on and standing up from a chair with arms (e.g., wheelchair, bedside commode, etc,.)?: Unable Help needed moving to and from a bed to chair (including a wheelchair)?: Total Help needed walking in hospital room?: Total Help needed climbing 3-5 steps with a railing? : Total 6 Click Score: 6    End of Session   Activity Tolerance: Patient limited by pain Patient left: with chair alarm set;in chair;with call bell/phone within reach Nurse Communication: Mobility status;Need for lift equipment PT Visit Diagnosis: Other abnormalities of gait and mobility (R26.89);Pain Pain - Right/Left: (bilateral) Pain - part of body: Knee(back)     Time: 8119-1478 PT Time Calculation (min) (ACUTE ONLY): 31 min  Charges:  $Therapeutic Activity: 8-22 mins                    G Codes:       Mylo Red, PT, DPT [phone]     Marcus [name] 06/16/2017, 11:47 AM

## 2017-06-16 NOTE — Plan of Care (Signed)
  Progressing Education: Knowledge of disease or condition will improve 06/16/2017 0321 - Progressing by Burtis Junesrewery, Gisella Alwine, RN Knowledge of secondary prevention will improve 06/16/2017 0321 - Progressing by Burtis Junesrewery, Genesi Stefanko, RN Clinical Measurements: Ability to maintain clinical measurements within normal limits will improve 06/16/2017 0332 - Progressing by Burtis Junesrewery, Betzabeth Derringer, RN Diagnostic test results will improve 06/16/2017 0332 - Progressing by Burtis Junesrewery, Jamiah Homeyer, RN  Continues antibiotics as ordered. Temp 100 denies chills. Remains in Sinus Tach HR 110s-120s

## 2017-06-16 NOTE — Progress Notes (Signed)
PROGRESS NOTE    Marcus [name]  BJY:[id number] DOB: 1965/06/25 DOA: 06/08/2017 PCP: Patient, No Pcp Per  Brief Narrative: PCC on transfer [facility] to [facility] on the 1/10 Briefly Mr. Perkey is a 52 year old male with history of polysubstance abuse and hep C was found down by his significant other, subsequently brought to the emergency room, he was obtunded and found to have severe metabolic encephalopathy, infective endocarditis, MSSA bacteremia, septic emboli/embolic strokes and left knee septic arthritis. 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 -With resultant left-sided weakness, left field cut, some dysarthria and dysphagia -Treatment for this is antibiotics for infective endocarditis, continue nafcillin -continue physical and occupational therapy, SNF recommended -speech therapy evaluation completed, dysphagia 1 diet recommended at this time -Appreciate neurology input recommended follow-up with [facility] neurology in 4-6 weeks in  Fluid overload/iatrogenic and third spacing from sepsis -ECHo with preserved EF -Improving, diuresed 4.5 L yesterday, continue IV Lasix again today will cut down dose   Hepatitis C antibody  positive -Genotype IIb with 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 -Follow-up with ID after acute illness is resolved  Polysubstance abuse -Per family history of substance abuse but no known history of IV drug use as far as they know -UDS positive for cocaine, opiates and benzos on admission  Acute kidney injury -Likely secondary to sepsis, creatinine was 3.0 on admission now down to 1.4 -Baseline unknown -Kidney function improved, likely new baseline  Thrombocytopenia -Baseline unknown -Likely worsened in the setting of sepsis -improved  DVT prophylaxis: resume lovenox Code Status:  full code  Family Communication: dtr at bedside  Disposition Plan: SNF when bacteremia cleared Consultants:   ID  PCCM  Ortho   neurology  Procedures: PROCEDURE: Arthrotomy of left knee and irrigation and debridement for septic arthritis 1/9   2-D echocardiogram   Antimicrobials:  Antibiotics Given (last 72 hours)    Date/Time Action Medication Dose Rate   06/13/17 1459 New Bag/Given   nafcillin 2 g in dextrose 5 % 100 mL IVPB 2 g 200 mL/hr   06/13/17 1848 New Bag/Given   nafcillin 2 g in dextrose 5 % 100 mL IVPB 2 g 200 mL/hr   06/13/17 2227 New Bag/Given   nafcillin 2 g in dextrose 5 % 100 mL IVPB 2 g 200 mL/hr   06/14/17 0307 New Bag/Given   nafcillin 2 g in dextrose 5 % 100 mL IVPB 2 g 200 mL/hr   06/14/17 0535 New Bag/Given   nafcillin 2 g in dextrose 5 % 100 mL IVPB 2 g 200 mL/hr   06/14/17 1034 New Bag/Given   nafcillin 2 g in dextrose 5 % 100 mL IVPB 2 g 200 mL/hr   06/14/17 1514 New Bag/Given   nafcillin 2 g in dextrose 5 % 100 mL IVPB 2  g 200 mL/hr   06/14/17 1716 New Bag/Given   nafcillin 2 g in dextrose 5 % 100 mL IVPB 2 g 200 mL/hr   06/14/17 2224 New Bag/Given   nafcillin 2 g in dextrose 5 % 100 mL IVPB 2 g 200 mL/hr   06/15/17 0201 New Bag/Given   nafcillin 2 g in dextrose 5 % 100 mL IVPB 2 g 200 mL/hr   06/15/17 0617 New Bag/Given   nafcillin 2 g in dextrose 5 % 100 mL IVPB 2 g 200  mL/hr   06/15/17 0845 New Bag/Given   nafcillin 2 g in dextrose 5 % 100 mL IVPB 2 g 200 mL/hr   06/15/17 1423 New Bag/Given   nafcillin 2 g in dextrose 5 % 100 mL IVPB 2 g 200 mL/hr   06/15/17 1840 New Bag/Given   nafcillin 2 g in dextrose 5 % 100 mL IVPB 2 g 200 mL/hr   06/15/17 2104 New Bag/Given   nafcillin 2 g in dextrose 5 % 100 mL IVPB 2 g 200 mL/hr   06/16/17 0106 New Bag/Given   nafcillin 2 g in dextrose 5 % 100 mL IVPB 2 g 200 mL/hr   06/16/17 0551 New Bag/Given   nafcillin 2 g in dextrose 5 % 100 mL IVPB 2 g 200 mL/hr   06/16/17 0941 New Bag/Given   nafcillin 2 g in dextrose 5 % 100 mL IVPB 2 g 200 mL/hr      Subjective: - Continues to have low back pain which is chronic, profound left-sided weakness Objective: Vitals:   06/15/17 1411 06/15/17 2110 06/16/17 0500 06/16/17 0534  BP: 127/71 119/60  101/62  Pulse: (!) 128 (!) 117  (!) 107  Resp: 17 17  17   Temp: 99.9 F (37.7 C) 100.1 F (37.8 C)  99.4 F (37.4 C)  TempSrc: Oral Oral  Oral  SpO2: 99% 100%  100%  Weight:   80.1 kg (176 lb 9.4 oz)   Height:        Intake/Output Summary (Last 24 hours) at 06/16/2017 1240 Last data filed at 06/16/2017 0551 Gross per 24 hour  Intake 1000 ml  Output 3500 ml  Net -2500 ml   Filed Weights   06/14/17 0500 06/15/17 0500 06/16/17 0500  Weight: 85.3 kg (188 lb 0.8 oz) 88 kg (194 lb 0.1 oz) 80.1 kg (176 lb 9.4 oz)    Examination: Gen: Awake, Alert, Oriented X 3, chronically ill-appearing HEENT: PERRLA, Neck supple, no JVD Lungs: Decreased breath sounds at both bases CVS: RRR,No Gallops,Rubs or new Murmurs Abd: soft, Non tender, non distended, BS present Extremities: 1+ edema diffusely, left knee with dressing Skin: no new rashes Neuro: L hemiparesis      Data Reviewed:   CBC: Recent Labs  Lab 06/10/17 0451 06/13/17 0140 06/15/17 0232 06/16/17 0235  WBC 11.8* 13.1* 13.2* 16.0*  HGB 10.0* 8.9* 8.9* 9.4*  HCT 29.6* 26.3* 27.1* 28.5*  MCV 81.3 82.2 84.2  84.6  PLT 55* 142* 288 319   Basic Metabolic Panel: Recent Labs  Lab 06/10/17 0451 06/10/17 1249 06/13/17 0140 06/13/17 0834 06/14/17 0752 06/15/17 0232 06/16/17 0235  NA 129* 130* 132*  --  130* 131* 133*  K 2.7* 3.1* 2.2*  --  3.1* 3.4* 3.3*  CL 100* 101 102  --  104 103 100*  CO2 17* 20* 21*  --  19* 18* 23  GLUCOSE 117* 108* 159*  --  117* 169* 122*  BUN 68* 58* 23*  --  15 14 16   CREATININE 2.01* 1.89* 1.40*  --  1.22 1.37* 1.37*  CALCIUM 6.3* 6.8* 6.7*  --  6.8* 7.1* 7.5*  MG 2.2  --   --  1.7  --   --   --   PHOS 4.1  --   --   --   --   --   --    GFR: Estimated Creatinine Clearance: 67.9 mL/min (A) (by C-G formula based on SCr of 1.37 mg/dL (H)). Liver Function Tests: No results for input(s): AST, ALT, ALKPHOS, BILITOT, PROT, ALBUMIN in the last 168 hours. No results for input(s): LIPASE, AMYLASE in the last 168 hours. No results for input(s): AMMONIA in the last 168 hours. Coagulation Profile: No results for input(s): INR, PROTIME in the last 168 hours. Cardiac Enzymes: Recent Labs  Lab 06/09/17 1832  TROPONINI 0.24*   BNP (last 3 results) No results for input(s): PROBNP in the last 8760 hours. HbA1C: No results for input(s): HGBA1C in the last 72 hours. CBG: Recent Labs  Lab 06/15/17 2110 06/16/17 0106 06/16/17 0520 06/16/17 0748 06/16/17 1151  GLUCAP 108* 111* 141* 112* 141*   Lipid Profile: No results for input(s): CHOL, HDL, LDLCALC, TRIG, CHOLHDL, LDLDIRECT in the last 72 hours. Thyroid Function Tests: No results for input(s): TSH, T4TOTAL, FREET4, T3FREE, THYROIDAB in the last 72 hours. Anemia Panel: No results for input(s): VITAMINB12, FOLATE, FERRITIN, TIBC, IRON, RETICCTPCT in the last 72 hours. Urine analysis:    Component Value Date/Time   COLORURINE AMBER (A) 06/08/2017 1724   APPEARANCEUR CLOUDY (A) 06/08/2017 1724   LABSPEC 1.016 06/08/2017 1724   PHURINE 5.0 06/08/2017 1724   GLUCOSEU NEGATIVE 06/08/2017 1724   HGBUR LARGE (A)  06/08/2017 1724   BILIRUBINUR NEGATIVE 06/08/2017 1724   KETONESUR NEGATIVE 06/08/2017 1724   PROTEINUR NEGATIVE 06/08/2017 1724   NITRITE NEGATIVE 06/08/2017 1724   LEUKOCYTESUR NEGATIVE 06/08/2017 1724   Sepsis Labs: @LABRCNTIP (procalcitonin:4,lacticidven:4)  ) Recent Results (from the past 240 hour(s))  Culture, blood (routine x 2)     Status: Abnormal   Collection Time: 06/08/17  5:30 PM  Result Value Ref Range Status   Specimen Description BLOOD LEFT ARM  Final   Special Requests IN PEDIATRIC BOTTLE Blood Culture adequate volume  Final   Culture  Setup Time   Final    GRAM POSITIVE COCCI IN CLUSTERS IN PEDIATRIC BOTTLE CRITICAL VALUE NOTED.  VALUE IS CONSISTENT WITH PREVIOUSLY REPORTED AND CALLED VALUE.    Culture (A)  Final    STAPHYLOCOCCUS AUREUS SUSCEPTIBILITIES PERFORMED ON PREVIOUS CULTURE WITHIN THE LAST 5 DAYS.    Report Status 06/11/2017 FINAL  Final  Culture, blood (routine x 2)     Status: Abnormal   Collection Time: 06/08/17  5:46 PM  Result Value Ref Range Status   Specimen Description BLOOD RIGHT ANTECUBITAL  Final   Special Requests   Final    BOTTLES DRAWN AEROBIC AND ANAEROBIC Blood Culture adequate volume   Culture  Setup Time   Final    IN BOTH AEROBIC AND ANAEROBIC BOTTLES GRAM POSITIVE COCCI IN CLUSTERS CRITICAL RESULT CALLED TO, READ BACK BY AND VERIFIED WITH: J.LEDFORD,PHARMD [id number] 06/09/17 M.CAMPBELL    Culture STAPHYLOCOCCUS AUREUS (A)  Final   Report Status 06/11/2017 FINAL  Final   Organism ID, Bacteria STAPHYLOCOCCUS AUREUS  Final      Susceptibility   Staphylococcus aureus - MIC*    CIPROFLOXACIN <=0.5 SENSITIVE Sensitive     ERYTHROMYCIN <=0.25 SENSITIVE Sensitive  GENTAMICIN <=0.5 SENSITIVE Sensitive     OXACILLIN <=0.25 SENSITIVE Sensitive     TETRACYCLINE <=1 SENSITIVE Sensitive     VANCOMYCIN <=0.5 SENSITIVE Sensitive     TRIMETH/SULFA <=10 SENSITIVE Sensitive     CLINDAMYCIN <=0.25 SENSITIVE Sensitive     RIFAMPIN <=0.5  SENSITIVE Sensitive     Inducible Clindamycin NEGATIVE Sensitive     * STAPHYLOCOCCUS AUREUS  Blood Culture ID Panel (Reflexed)     Status: Abnormal   Collection Time: 06/08/17  5:46 PM  Result Value Ref Range Status   Enterococcus species NOT DETECTED NOT DETECTED Final   Listeria monocytogenes NOT DETECTED NOT DETECTED Final   Staphylococcus species DETECTED (A) NOT DETECTED Final    Comment: CRITICAL RESULT CALLED TO, READ BACK BY AND VERIFIED WITH: J.LEDFORD,PHARMD [phone] 06/09/17 M.CAMPBELL    Staphylococcus aureus DETECTED (A) NOT DETECTED Final    Comment: Methicillin (oxacillin) susceptible Staphylococcus aureus (MSSA). Preferred therapy is anti staphylococcal beta lactam antibiotic (Cefazolin or Nafcillin), unless clinically contraindicated. CRITICAL RESULT CALLED TO, READ BACK BY AND VERIFIED WITH: J.LEDFORD,PHARMD [phone] 06/09/17 M.CAMPBELL    Methicillin resistance NOT DETECTED NOT DETECTED Final   Streptococcus species NOT DETECTED NOT DETECTED Final   Streptococcus agalactiae NOT DETECTED NOT DETECTED Final   Streptococcus pneumoniae NOT DETECTED NOT DETECTED Final   Streptococcus pyogenes NOT DETECTED NOT DETECTED Final   Acinetobacter baumannii NOT DETECTED NOT DETECTED Final   Enterobacteriaceae species NOT DETECTED NOT DETECTED Final   Enterobacter cloacae complex NOT DETECTED NOT DETECTED Final   Escherichia coli NOT DETECTED NOT DETECTED Final   Klebsiella oxytoca NOT DETECTED NOT DETECTED Final   Klebsiella pneumoniae NOT DETECTED NOT DETECTED Final   Proteus species NOT DETECTED NOT DETECTED Final   Serratia marcescens NOT DETECTED NOT DETECTED Final   Haemophilus influenzae NOT DETECTED NOT DETECTED Final   Neisseria meningitidis NOT DETECTED NOT DETECTED Final   Pseudomonas aeruginosa NOT DETECTED NOT DETECTED Final   Candida albicans NOT DETECTED NOT DETECTED Final   Candida glabrata NOT DETECTED NOT DETECTED Final   Candida krusei NOT DETECTED NOT DETECTED  Final   Candida parapsilosis NOT DETECTED NOT DETECTED Final   Candida tropicalis NOT DETECTED NOT DETECTED Final  Urine culture     Status: Abnormal   Collection Time: 06/08/17  7:46 PM  Result Value Ref Range Status   Specimen Description URINE, CLEAN CATCH  Final   Special Requests Normal  Final   Culture >=100,000 COLONIES/mL STAPHYLOCOCCUS AUREUS (A)  Final   Report Status 06/11/2017 FINAL  Final   Organism ID, Bacteria STAPHYLOCOCCUS AUREUS (A)  Final      Susceptibility   Staphylococcus aureus - MIC*    CIPROFLOXACIN <=0.5 SENSITIVE Sensitive     GENTAMICIN <=0.5 SENSITIVE Sensitive     NITROFURANTOIN 32 SENSITIVE Sensitive     OXACILLIN <=0.25 SENSITIVE Sensitive     TETRACYCLINE <=1 SENSITIVE Sensitive     VANCOMYCIN 1 SENSITIVE Sensitive     TRIMETH/SULFA <=10 SENSITIVE Sensitive     CLINDAMYCIN <=0.25 SENSITIVE Sensitive     RIFAMPIN <=0.5 SENSITIVE Sensitive     Inducible Clindamycin NEGATIVE Sensitive     * >=100,000 COLONIES/mL STAPHYLOCOCCUS AUREUS  Respiratory Panel by PCR     Status: None   Collection Time: 06/08/17  7:46 PM  Result Value Ref Range Status   Adenovirus NOT DETECTED NOT DETECTED Final   Coronavirus 229E NOT DETECTED NOT DETECTED Final   Coronavirus HKU1 NOT DETECTED NOT DETECTED Final   Coronavirus  NL63 NOT DETECTED NOT DETECTED Final   Coronavirus OC43 NOT DETECTED NOT DETECTED Final   Metapneumovirus NOT DETECTED NOT DETECTED Final   Rhinovirus / Enterovirus NOT DETECTED NOT DETECTED Final   Influenza A NOT DETECTED NOT DETECTED Final   Influenza B NOT DETECTED NOT DETECTED Final   Parainfluenza Virus 1 NOT DETECTED NOT DETECTED Final   Parainfluenza Virus 2 NOT DETECTED NOT DETECTED Final   Parainfluenza Virus 3 NOT DETECTED NOT DETECTED Final   Parainfluenza Virus 4 NOT DETECTED NOT DETECTED Final   Respiratory Syncytial Virus NOT DETECTED NOT DETECTED Final   Bordetella pertussis NOT DETECTED NOT DETECTED Final   Chlamydophila pneumoniae  NOT DETECTED NOT DETECTED Final   Mycoplasma pneumoniae NOT DETECTED NOT DETECTED Final  CSF culture     Status: None   Collection Time: 06/08/17 11:00 PM  Result Value Ref Range Status   Specimen Description CSF  Final   Special Requests Normal  Final   Culture NO GROWTH 3 DAYS  Final   Report Status 06/12/2017 FINAL  Final  Gram stain     Status: None   Collection Time: 06/08/17 11:00 PM  Result Value Ref Range Status   Specimen Description CSF  Final   Special Requests Normal  Final   Gram Stain   Final    CYTOSPIN SLIDE WBC PRESENT,BOTH PMN AND MONONUCLEAR NO ORGANISMS SEEN    Report Status 06/08/2017 FINAL  Final  Culture, blood (single)     Status: None   Collection Time: 06/08/17 11:30 PM  Result Value Ref Range Status   Specimen Description BLOOD LEFT FOREARM  Final   Special Requests   Final    BOTTLES DRAWN AEROBIC AND ANAEROBIC Blood Culture adequate volume   Culture NO GROWTH 5 DAYS  Final   Report Status 06/14/2017 FINAL  Final  MRSA PCR Screening     Status: None   Collection Time: 06/09/17  3:15 AM  Result Value Ref Range Status   MRSA by PCR NEGATIVE NEGATIVE Final    Comment:        The GeneXpert MRSA Assay (FDA approved for NASAL specimens only), is one component of a comprehensive MRSA colonization surveillance program. It is not intended to diagnose MRSA infection nor to guide or monitor treatment for MRSA infections.   Body fluid culture     Status: None   Collection Time: 06/10/17  1:13 PM  Result Value Ref Range Status   Specimen Description SYNOVIAL LEFT KNEE  Final   Special Requests Normal  Final   Gram Stain   Final    FEW WBC PRESENT, PREDOMINANTLY PMN RARE GRAM POSITIVE COCCI IN PAIRS    Culture FEW STAPHYLOCOCCUS AUREUS  Final   Report Status 06/13/2017 FINAL  Final   Organism ID, Bacteria STAPHYLOCOCCUS AUREUS  Final      Susceptibility   Staphylococcus aureus - MIC*    CIPROFLOXACIN <=0.5 SENSITIVE Sensitive     ERYTHROMYCIN  <=0.25 SENSITIVE Sensitive     GENTAMICIN <=0.5 SENSITIVE Sensitive     OXACILLIN <=0.25 SENSITIVE Sensitive     TETRACYCLINE <=1 SENSITIVE Sensitive     VANCOMYCIN <=0.5 SENSITIVE Sensitive     TRIMETH/SULFA <=10 SENSITIVE Sensitive     CLINDAMYCIN <=0.25 SENSITIVE Sensitive     RIFAMPIN <=0.5 SENSITIVE Sensitive     Inducible Clindamycin NEGATIVE Sensitive     PENICILLIN Value in next row Sensitive      SENSITIVE0.06    * FEW STAPHYLOCOCCUS AUREUS  Surgical  PCR screen     Status: Abnormal   Collection Time: 06/11/17  1:18 PM  Result Value Ref Range Status   MRSA, PCR NEGATIVE NEGATIVE Final   Staphylococcus aureus POSITIVE (A) NEGATIVE Final    Comment: (NOTE) The Xpert SA Assay (FDA approved for NASAL specimens in patients 61 years of age and older), is one component of a comprehensive surveillance program. It is not intended to diagnose infection nor to guide or monitor treatment.   Aerobic/Anaerobic Culture (surgical/deep wound)     Status: None (Preliminary result)   Collection Time: 06/11/17  3:21 PM  Result Value Ref Range Status   Specimen Description ABSCESS LEFT KNEE  Final   Special Requests NONE  Final   Gram Stain   Final    RARE WBC PRESENT, PREDOMINANTLY PMN RARE GRAM POSITIVE COCCI IN PAIRS    Culture   Final    FEW STAPHYLOCOCCUS AUREUS NO ANAEROBES ISOLATED; CULTURE IN PROGRESS FOR 5 DAYS    Report Status PENDING  Incomplete   Organism ID, Bacteria STAPHYLOCOCCUS AUREUS  Final      Susceptibility   Staphylococcus aureus - MIC*    CIPROFLOXACIN <=0.5 SENSITIVE Sensitive     ERYTHROMYCIN <=0.25 SENSITIVE Sensitive     GENTAMICIN <=0.5 SENSITIVE Sensitive     OXACILLIN <=0.25 SENSITIVE Sensitive     TETRACYCLINE <=1 SENSITIVE Sensitive     VANCOMYCIN <=0.5 SENSITIVE Sensitive     TRIMETH/SULFA <=10 SENSITIVE Sensitive     CLINDAMYCIN <=0.25 SENSITIVE Sensitive     RIFAMPIN <=0.5 SENSITIVE Sensitive     Inducible Clindamycin NEGATIVE Sensitive     *  FEW STAPHYLOCOCCUS AUREUS  Culture, blood (routine x 2)     Status: Abnormal   Collection Time: 06/13/17 11:59 AM  Result Value Ref Range Status   Specimen Description BLOOD LEFT ANTECUBITAL  Final   Special Requests IN PEDIATRIC BOTTLE Blood Culture adequate volume  Final   Culture  Setup Time   Final    GRAM POSITIVE COCCI IN CLUSTERS IN PEDIATRIC BOTTLE CRITICAL VALUE NOTED.  VALUE IS CONSISTENT WITH PREVIOUSLY REPORTED AND CALLED VALUE.    Culture (A)  Final    STAPHYLOCOCCUS AUREUS SUSCEPTIBILITIES PERFORMED ON PREVIOUS CULTURE WITHIN THE LAST 5 DAYS.    Report Status 06/15/2017 FINAL  Final  Culture, blood (routine x 2)     Status: Abnormal   Collection Time: 06/13/17 12:07 PM  Result Value Ref Range Status   Specimen Description BLOOD LEFT ANTECUBITAL  Final   Special Requests IN PEDIATRIC BOTTLE Blood Culture adequate volume  Final   Culture  Setup Time   Final    GRAM POSITIVE COCCI IN CLUSTERS IN PEDIATRIC BOTTLE CRITICAL RESULT CALLED TO, READ BACK BY AND VERIFIED WITH: T EGAN,PHARMD AT 4098 06/14/17 BY L BENFIELD    Culture (A)  Final    STAPHYLOCOCCUS AUREUS SUSCEPTIBILITIES PERFORMED ON PREVIOUS CULTURE WITHIN THE LAST 5 DAYS.    Report Status 06/15/2017 FINAL  Final  Culture, blood (routine x 2)     Status: None (Preliminary result)   Collection Time: 06/14/17  9:10 AM  Result Value Ref Range Status   Specimen Description BLOOD RIGHT ARM  Final   Special Requests   Final    BOTTLES DRAWN AEROBIC ONLY Blood Culture adequate volume   Culture NO GROWTH 1 DAY  Final   Report Status PENDING  Incomplete  Culture, blood (routine x 2)     Status: Abnormal   Collection Time: 06/14/17  9:20 AM  Result Value Ref Range Status   Specimen Description BLOOD RIGHT HAND  Final   Special Requests IN PEDIATRIC BOTTLE Blood Culture adequate volume  Final   Culture  Setup Time   Final    GRAM POSITIVE COCCI IN CLUSTERS IN PEDIATRIC BOTTLE CRITICAL RESULT CALLED TO, READ BACK  BY AND VERIFIED WITH: K HYATT,PHARMD AT [id number] 06/15/17 BY L BENFIELD    Culture (A)  Final    STAPHYLOCOCCUS AUREUS SUSCEPTIBILITIES PERFORMED ON PREVIOUS CULTURE WITHIN THE LAST 5 DAYS.    Report Status 06/16/2017 FINAL  Final         Radiology Studies: No results found.      Scheduled Meds: . feeding supplement  1 Container Oral TID BM  . furosemide  20 mg Intravenous Q12H  . Influenza vac split quadrivalent PF  0.5 mL Intramuscular Tomorrow-1000  . pantoprazole  40 mg Oral Q1200  . potassium chloride  40 mEq Oral BID  . potassium chloride  40 mEq Oral Once  . senna-docusate  1 tablet Oral BID  . thiamine  100 mg Oral Daily   Continuous Infusions: . sodium chloride    . nafcillin IV Stopped (06/16/17 1011)     LOS: 7 days    Time spent:    Zannie Cove, MD [facility] Page via [email or web address], password [email or web address] After 7PM please contact night-coverage  06/16/2017, 12:40 PM

## 2017-06-17 LAB — BASIC METABOLIC PANEL
Anion gap: 9 (ref 5–15)
BUN: 18 mg/dL (ref 6–20)
CO2: 25 mmol/L (ref 22–32)
CREATININE: 1.38 mg/dL — AB (ref 0.61–1.24)
Calcium: 7.2 mg/dL — ABNORMAL LOW (ref 8.9–10.3)
Chloride: 98 mmol/L — ABNORMAL LOW (ref 101–111)
GFR, EST NON AFRICAN AMERICAN: 58 mL/min — AB (ref >60)
Glucose, Bld: 123 mg/dL — ABNORMAL HIGH (ref 65–99)
POTASSIUM: 3.3 mmol/L — AB (ref 3.5–5.1)
SODIUM: 132 mmol/L — AB (ref 135–145)

## 2017-06-17 LAB — GLUCOSE, CAPILLARY
GLUCOSE-CAPILLARY: 82 mg/dL (ref 65–99)
GLUCOSE-CAPILLARY: 135 mg/dL — AB (ref 65–99)
Glucose-Capillary: 112 mg/dL — ABNORMAL HIGH (ref 65–99)
Glucose-Capillary: 127 mg/dL — ABNORMAL HIGH (ref 65–99)
Glucose-Capillary: 153 mg/dL — ABNORMAL HIGH (ref 65–99)
Glucose-Capillary: 166 mg/dL — ABNORMAL HIGH (ref 65–99)

## 2017-06-17 MED ORDER — POTASSIUM CHLORIDE CRYS ER 20 MEQ PO TBCR
40.0000 meq | EXTENDED_RELEASE_TABLET | Freq: Once | ORAL | Status: AC
  Administered 2017-06-17: 40 meq via ORAL
  Filled 2017-06-17: qty 2

## 2017-06-17 NOTE — Progress Notes (Signed)
PROGRESS NOTE    Marcus [name]  ZOX:[id number] DOB: 1965/09/16 DOA: 06/08/2017 PCP: Patient, No Pcp Per  Brief Narrative: PCC on transfer [facility] 1/10 Briefly Marcus [name] is a 52 year old male with history of polysubstance abuse and hep C was found down by his significant other, subsequently brought to the emergency room, he was obtunded and found to have severe metabolic encephalopathy, infective endocarditis, MSSA bacteremia, septic emboli/embolic strokes and left knee septic arthritis. 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 -History of polysubstance abuse however no clear history obtained to suggest IV drug use, could have started from Septic Knee -clinically improving, sepsis physiology resolved -Repeat blood cultures from 1/9 and 1/11 remain positive, blood cultures from 1/12, positive 1 in /2 -repeat Cx 1/14 pending will FU -2D ECHO with mitral valve vegetation -ID following, plan for 6 weeks of IV Nafcillin or Ampicillin from first negative cultures -febrile last pm -PT/OT eval completed, SNF recommended -CSW following for rehabilitation options  Left Knee Septic Arthritis -Culture w/ MSSA -s/p incision and debridement on 1/9 by Dr. 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 -With resultant left-sided weakness, left field cut, some dysarthria and dysphagia -Treatment for this is antibiotics for infective endocarditis, continue nafcillin -continue physical and occupational therapy, SNF recommended -speech therapy evaluation completed, dysphagia 1 diet recommended at this time -Appreciate neurology input recommended follow-up with [facility] neurology in 4-6  weeks  Fluid overload/iatrogenic and third spacing from sepsis -ECHo with preserved EF -Improving, diuresed for last 48hours -appears euvolemic now, hold lasix and monitor  Hepatitis C antibody positive -Genotype IIb with 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 -Follow-up with ID after acute illness is resolved  Polysubstance abuse -Per family history of substance abuse but no known history of IV drug use as far as they know -UDS positive for cocaine, opiates and benzos on admission  Acute kidney injury -Likely secondary to sepsis, creatinine was 3.0 on admission now down to 1.4 -Baseline unknown -Kidney function improved, likely new baseline  Thrombocytopenia -Baseline unknown -Likely worsened in the setting of sepsis -improved  DVT prophylaxis:  lovenox Code Status:  full code  Family Communication: dtr at bedside  Disposition Plan: SNF when bacteremia cleared Consultants:   ID  PCCM  Ortho   neurology  Procedures: PROCEDURE: Arthrotomy of left knee and irrigation and debridement for septic arthritis 1/9   2-D echocardiogram   Antimicrobials:  Antibiotics Given (last 72 hours)    Date/Time Action Medication Dose Rate   06/14/17 1514 New Bag/Given   nafcillin 2 g in dextrose 5 % 100 mL IVPB 2 g 200 mL/hr   06/14/17 1716 New Bag/Given   nafcillin 2 g in dextrose 5 % 100 mL IVPB 2 g 200 mL/hr   06/14/17 2224 New Bag/Given   nafcillin 2 g in dextrose 5 % 100 mL IVPB 2 g 200 mL/hr   06/15/17 0201 New Bag/Given   nafcillin 2 g in dextrose 5 % 100 mL IVPB 2 g 200 mL/hr   06/15/17 0617 New Bag/Given   nafcillin 2 g in dextrose 5 % 100 mL IVPB 2 g 200 mL/hr   06/15/17 0845 New Bag/Given   nafcillin 2 g in dextrose 5 % 100  mL IVPB 2 g 200 mL/hr   06/15/17 1423 New Bag/Given   nafcillin 2 g in dextrose 5 % 100 mL IVPB 2 g 200 mL/hr   06/15/17 1840 New Bag/Given   nafcillin 2 g in dextrose 5 % 100 mL IVPB 2 g 200 mL/hr   06/15/17 2104 New Bag/Given   nafcillin 2 g in dextrose 5 % 100  mL IVPB 2 g 200 mL/hr   06/16/17 0106 New Bag/Given   nafcillin 2 g in dextrose 5 % 100 mL IVPB 2 g 200 mL/hr   06/16/17 0551 New Bag/Given   nafcillin 2 g in dextrose 5 % 100 mL IVPB 2 g 200 mL/hr   06/16/17 0941 New Bag/Given   nafcillin 2 g in dextrose 5 % 100 mL IVPB 2 g 200 mL/hr   06/16/17 1408 New Bag/Given   nafcillin 2 g in dextrose 5 % 100 mL IVPB 2 g 200 mL/hr   06/16/17 1806 New Bag/Given   nafcillin 2 g in dextrose 5 % 100 mL IVPB 2 g 200 mL/hr   06/16/17 2140 New Bag/Given   nafcillin 2 g in dextrose 5 % 100 mL IVPB 2 g 200 mL/hr   06/17/17 0137 New Bag/Given   nafcillin 2 g in dextrose 5 % 100 mL IVPB 2 g 200 mL/hr   06/17/17 0602 New Bag/Given   nafcillin 2 g in dextrose 5 % 100 mL IVPB 2 g 200 mL/hr   06/17/17 0957 New Bag/Given   nafcillin 2 g in dextrose 5 % 100 mL IVPB 2 g 200 mL/hr      Subjective: -Febrile last night, reports feeling lousy then, denies any new cough or shortness of breath, no diarrhea reports chronic back pain,   Objective: Vitals:   06/16/17 1252 06/16/17 2057 06/17/17 0503 06/17/17 1000  BP: (!) 108/55 (!) 128/52 (!) 119/54   Pulse: (!) 108 (!) 122 (!) 113   Resp: 16 18 18    Temp: 99.6 F (37.6 C) 100.1 F (37.8 C) (!) 101.5 F (38.6 C) (!) 100.6 F (38.1 C)  TempSrc: Oral Oral Oral Axillary  SpO2: 99% 96% 98%   Weight:   77.8 kg (171 lb 8.3 oz)   Height:        Intake/Output Summary (Last 24 hours) at 06/17/2017 1133 Last data filed at 06/17/2017 0957 Gross per 24 hour  Intake 1060 ml  Output 3852 ml  Net -2792 ml   Filed Weights   06/15/17 0500 06/16/17 0500 06/17/17 0503  Weight: 88 kg (194 lb 0.1 oz) 80.1 kg (176 lb 9.4 oz) 77.8 kg (171 lb 8.3 oz)    Examination: Gen: Awake, Alert, Oriented X 3, ill-appearing HEENT: No JVD, very poor dentition Lungs: Decreased breath sounds at bases CVS: RRR,No Gallops,Rubs or new Murmurs Abd: soft, Non tender, non distended, BS present Extremities: Left knee with dressing, trace  edema Skin: no new rashes  neuro left hemiparesis      Data Reviewed:   CBC: Recent Labs  Lab 06/13/17 0140 06/15/17 0232 06/16/17 0235  WBC 13.1* 13.2* 16.0*  HGB 8.9* 8.9* 9.4*  HCT 26.3* 27.1* 28.5*  MCV 82.2 84.2 84.6  PLT 142* 288 319   Basic Metabolic Panel: Recent Labs  Lab 06/13/17 0140 06/13/17 0834 06/14/17 0752 06/15/17 0232 06/16/17 0235 06/17/17 0232  NA 132*  --  130* 131* 133* 132*  K 2.2*  --  3.1* 3.4* 3.3* 3.3*  CL 102  --  104 103 100* 98*  CO2 21*  --  19* 18* 23 25  GLUCOSE 159*  --  117* 169* 122* 123*  BUN 23*  --  15 14 16 18   CREATININE 1.40*  --  1.22 1.37* 1.37* 1.38*  CALCIUM 6.7*  --  6.8* 7.1* 7.5* 7.2*  MG  --  1.7  --   --   --   --    GFR: Estimated Creatinine Clearance: 67.4 mL/min (A) (by C-G formula based on SCr of 1.38 mg/dL (H)). Liver Function Tests: No results for input(s): AST, ALT, ALKPHOS, BILITOT, PROT, ALBUMIN in the last 168 hours. No results for input(s): LIPASE, AMYLASE in the last 168 hours. No results for input(s): AMMONIA in the last 168 hours. Coagulation Profile: No results for input(s): INR, PROTIME in the last 168 hours. Cardiac Enzymes: No results for input(s): CKTOTAL, CKMB, CKMBINDEX, TROPONINI in the last 168 hours. BNP (last 3 results) No results for input(s): PROBNP in the last 8760 hours. HbA1C: No results for input(s): HGBA1C in the last 72 hours. CBG: Recent Labs  Lab 06/16/17 1618 06/16/17 2107 06/17/17 0056 06/17/17 0500 06/17/17 0804  GLUCAP 177* 119* 127* 166* 112*   Lipid Profile: No results for input(s): CHOL, HDL, LDLCALC, TRIG, CHOLHDL, LDLDIRECT in the last 72 hours. Thyroid Function Tests: No results for input(s): TSH, T4TOTAL, FREET4, T3FREE, THYROIDAB in the last 72 hours. Anemia Panel: No results for input(s): VITAMINB12, FOLATE, FERRITIN, TIBC, IRON, RETICCTPCT in the last 72 hours. Urine analysis:    Component Value Date/Time   COLORURINE AMBER (A) 06/08/2017 1724     APPEARANCEUR CLOUDY (A) 06/08/2017 1724   LABSPEC 1.016 06/08/2017 1724   PHURINE 5.0 06/08/2017 1724   GLUCOSEU NEGATIVE 06/08/2017 1724   HGBUR LARGE (A) 06/08/2017 1724   BILIRUBINUR NEGATIVE 06/08/2017 1724   KETONESUR NEGATIVE 06/08/2017 1724   PROTEINUR NEGATIVE 06/08/2017 1724   NITRITE NEGATIVE 06/08/2017 1724   LEUKOCYTESUR NEGATIVE 06/08/2017 1724   Sepsis Labs: @LABRCNTIP (procalcitonin:4,lacticidven:4)  ) Recent Results (from the past 240 hour(s))  Culture, blood (routine x 2)     Status: Abnormal   Collection Time: 06/08/17  5:30 PM  Result Value Ref Range Status   Specimen Description BLOOD LEFT ARM  Final   Special Requests IN PEDIATRIC BOTTLE Blood Culture adequate volume  Final   Culture  Setup Time   Final    GRAM POSITIVE COCCI IN CLUSTERS IN PEDIATRIC BOTTLE CRITICAL VALUE NOTED.  VALUE IS CONSISTENT WITH PREVIOUSLY REPORTED AND CALLED VALUE.    Culture (A)  Final    STAPHYLOCOCCUS AUREUS SUSCEPTIBILITIES PERFORMED ON PREVIOUS CULTURE WITHIN THE LAST 5 DAYS.    Report Status 06/11/2017 FINAL  Final  Culture, blood (routine x 2)     Status: Abnormal   Collection Time: 06/08/17  5:46 PM  Result Value Ref Range Status   Specimen Description BLOOD RIGHT ANTECUBITAL  Final   Special Requests   Final    BOTTLES DRAWN AEROBIC AND ANAEROBIC Blood Culture adequate volume   Culture  Setup Time   Final    IN BOTH AEROBIC AND ANAEROBIC BOTTLES GRAM POSITIVE COCCI IN CLUSTERS CRITICAL RESULT CALLED TO, READ BACK BY AND VERIFIED WITH: J.LEDFORD,PHARMD 1610 06/09/17 M.CAMPBELL    Culture STAPHYLOCOCCUS AUREUS (A)  Final   Report Status 06/11/2017 FINAL  Final   Organism ID, Bacteria STAPHYLOCOCCUS AUREUS  Final      Susceptibility   Staphylococcus aureus - MIC*    CIPROFLOXACIN <=0.5 SENSITIVE Sensitive     ERYTHROMYCIN <=0.25  SENSITIVE Sensitive     GENTAMICIN <=0.5 SENSITIVE Sensitive     OXACILLIN <=0.25 SENSITIVE Sensitive     TETRACYCLINE <=1 SENSITIVE  Sensitive     VANCOMYCIN <=0.5 SENSITIVE Sensitive     TRIMETH/SULFA <=10 SENSITIVE Sensitive     CLINDAMYCIN <=0.25 SENSITIVE Sensitive     RIFAMPIN <=0.5 SENSITIVE Sensitive     Inducible Clindamycin NEGATIVE Sensitive     * STAPHYLOCOCCUS AUREUS  Blood Culture ID Panel (Reflexed)     Status: Abnormal   Collection Time: 06/08/17  5:46 PM  Result Value Ref Range Status   Enterococcus species NOT DETECTED NOT DETECTED Final   Listeria monocytogenes NOT DETECTED NOT DETECTED Final   Staphylococcus species DETECTED (A) NOT DETECTED Final    Comment: CRITICAL RESULT CALLED TO, READ BACK BY AND VERIFIED WITH: J.LEDFORD,PHARMD [id number] 06/09/17 M.CAMPBELL    Staphylococcus aureus DETECTED (A) NOT DETECTED Final    Comment: Methicillin (oxacillin) susceptible Staphylococcus aureus (MSSA). Preferred therapy is anti staphylococcal beta lactam antibiotic (Cefazolin or Nafcillin), unless clinically contraindicated. CRITICAL RESULT CALLED TO, READ BACK BY AND VERIFIED WITH: J.LEDFORD,PHARMD 1191 06/09/17 M.CAMPBELL    Methicillin resistance NOT DETECTED NOT DETECTED Final   Streptococcus species NOT DETECTED NOT DETECTED Final   Streptococcus agalactiae NOT DETECTED NOT DETECTED Final   Streptococcus pneumoniae NOT DETECTED NOT DETECTED Final   Streptococcus pyogenes NOT DETECTED NOT DETECTED Final   Acinetobacter baumannii NOT DETECTED NOT DETECTED Final   Enterobacteriaceae species NOT DETECTED NOT DETECTED Final   Enterobacter cloacae complex NOT DETECTED NOT DETECTED Final   Escherichia coli NOT DETECTED NOT DETECTED Final   Klebsiella oxytoca NOT DETECTED NOT DETECTED Final   Klebsiella pneumoniae NOT DETECTED NOT DETECTED Final   Proteus species NOT DETECTED NOT DETECTED Final   Serratia marcescens NOT DETECTED NOT DETECTED Final   Haemophilus influenzae NOT DETECTED NOT DETECTED Final   Neisseria meningitidis NOT DETECTED NOT DETECTED Final   Pseudomonas aeruginosa NOT DETECTED NOT  DETECTED Final   Candida albicans NOT DETECTED NOT DETECTED Final   Candida glabrata NOT DETECTED NOT DETECTED Final   Candida krusei NOT DETECTED NOT DETECTED Final   Candida parapsilosis NOT DETECTED NOT DETECTED Final   Candida tropicalis NOT DETECTED NOT DETECTED Final  Urine culture     Status: Abnormal   Collection Time: 06/08/17  7:46 PM  Result Value Ref Range Status   Specimen Description URINE, CLEAN CATCH  Final   Special Requests Normal  Final   Culture >=100,000 COLONIES/mL STAPHYLOCOCCUS AUREUS (A)  Final   Report Status 06/11/2017 FINAL  Final   Organism ID, Bacteria STAPHYLOCOCCUS AUREUS (A)  Final      Susceptibility   Staphylococcus aureus - MIC*    CIPROFLOXACIN <=0.5 SENSITIVE Sensitive     GENTAMICIN <=0.5 SENSITIVE Sensitive     NITROFURANTOIN 32 SENSITIVE Sensitive     OXACILLIN <=0.25 SENSITIVE Sensitive     TETRACYCLINE <=1 SENSITIVE Sensitive     VANCOMYCIN 1 SENSITIVE Sensitive     TRIMETH/SULFA <=10 SENSITIVE Sensitive     CLINDAMYCIN <=0.25 SENSITIVE Sensitive     RIFAMPIN <=0.5 SENSITIVE Sensitive     Inducible Clindamycin NEGATIVE Sensitive     * >=100,000 COLONIES/mL STAPHYLOCOCCUS AUREUS  Respiratory Panel by PCR     Status: None   Collection Time: 06/08/17  7:46 PM  Result Value Ref Range Status   Adenovirus NOT DETECTED NOT DETECTED Final   Coronavirus 229E NOT DETECTED NOT DETECTED Final   Coronavirus HKU1 NOT DETECTED  NOT DETECTED Final   Coronavirus NL63 NOT DETECTED NOT DETECTED Final   Coronavirus OC43 NOT DETECTED NOT DETECTED Final   Metapneumovirus NOT DETECTED NOT DETECTED Final   Rhinovirus / Enterovirus NOT DETECTED NOT DETECTED Final   Influenza A NOT DETECTED NOT DETECTED Final   Influenza B NOT DETECTED NOT DETECTED Final   Parainfluenza Virus 1 NOT DETECTED NOT DETECTED Final   Parainfluenza Virus 2 NOT DETECTED NOT DETECTED Final   Parainfluenza Virus 3 NOT DETECTED NOT DETECTED Final   Parainfluenza Virus 4 NOT DETECTED NOT  DETECTED Final   Respiratory Syncytial Virus NOT DETECTED NOT DETECTED Final   Bordetella pertussis NOT DETECTED NOT DETECTED Final   Chlamydophila pneumoniae NOT DETECTED NOT DETECTED Final   Mycoplasma pneumoniae NOT DETECTED NOT DETECTED Final  CSF culture     Status: None   Collection Time: 06/08/17 11:00 PM  Result Value Ref Range Status   Specimen Description CSF  Final   Special Requests Normal  Final   Culture NO GROWTH 3 DAYS  Final   Report Status 06/12/2017 FINAL  Final  Gram stain     Status: None   Collection Time: 06/08/17 11:00 PM  Result Value Ref Range Status   Specimen Description CSF  Final   Special Requests Normal  Final   Gram Stain   Final    CYTOSPIN SLIDE WBC PRESENT,BOTH PMN AND MONONUCLEAR NO ORGANISMS SEEN    Report Status 06/08/2017 FINAL  Final  Culture, blood (single)     Status: None   Collection Time: 06/08/17 11:30 PM  Result Value Ref Range Status   Specimen Description BLOOD LEFT FOREARM  Final   Special Requests   Final    BOTTLES DRAWN AEROBIC AND ANAEROBIC Blood Culture adequate volume   Culture NO GROWTH 5 DAYS  Final   Report Status 06/14/2017 FINAL  Final  MRSA PCR Screening     Status: None   Collection Time: 06/09/17  3:15 AM  Result Value Ref Range Status   MRSA by PCR NEGATIVE NEGATIVE Final    Comment:        The GeneXpert MRSA Assay (FDA approved for NASAL specimens only), is one component of a comprehensive MRSA colonization surveillance program. It is not intended to diagnose MRSA infection nor to guide or monitor treatment for MRSA infections.   Body fluid culture     Status: None   Collection Time: 06/10/17  1:13 PM  Result Value Ref Range Status   Specimen Description SYNOVIAL LEFT KNEE  Final   Special Requests Normal  Final   Gram Stain   Final    FEW WBC PRESENT, PREDOMINANTLY PMN RARE GRAM POSITIVE COCCI IN PAIRS    Culture FEW STAPHYLOCOCCUS AUREUS  Final   Report Status 06/13/2017 FINAL  Final    Organism ID, Bacteria STAPHYLOCOCCUS AUREUS  Final      Susceptibility   Staphylococcus aureus - MIC*    CIPROFLOXACIN <=0.5 SENSITIVE Sensitive     ERYTHROMYCIN <=0.25 SENSITIVE Sensitive     GENTAMICIN <=0.5 SENSITIVE Sensitive     OXACILLIN <=0.25 SENSITIVE Sensitive     TETRACYCLINE <=1 SENSITIVE Sensitive     VANCOMYCIN <=0.5 SENSITIVE Sensitive     TRIMETH/SULFA <=10 SENSITIVE Sensitive     CLINDAMYCIN <=0.25 SENSITIVE Sensitive     RIFAMPIN <=0.5 SENSITIVE Sensitive     Inducible Clindamycin NEGATIVE Sensitive     PENICILLIN Value in next row Sensitive      SENSITIVE0.06    *  FEW STAPHYLOCOCCUS AUREUS  Surgical PCR screen     Status: Abnormal   Collection Time: 06/11/17  1:18 PM  Result Value Ref Range Status   MRSA, PCR NEGATIVE NEGATIVE Final   Staphylococcus aureus POSITIVE (A) NEGATIVE Final    Comment: (NOTE) The Xpert SA Assay (FDA approved for NASAL specimens in patients 52 years of age and older), is one component of a comprehensive surveillance program. It is not intended to diagnose infection nor to guide or monitor treatment.   Aerobic/Anaerobic Culture (surgical/deep wound)     Status: None   Collection Time: 06/11/17  3:21 PM  Result Value Ref Range Status   Specimen Description ABSCESS LEFT KNEE  Final   Special Requests NONE  Final   Gram Stain   Final    RARE WBC PRESENT, PREDOMINANTLY PMN RARE GRAM POSITIVE COCCI IN PAIRS    Culture FEW STAPHYLOCOCCUS AUREUS NO ANAEROBES ISOLATED   Final   Report Status 06/16/2017 FINAL  Final   Organism ID, Bacteria STAPHYLOCOCCUS AUREUS  Final      Susceptibility   Staphylococcus aureus - MIC*    CIPROFLOXACIN <=0.5 SENSITIVE Sensitive     ERYTHROMYCIN <=0.25 SENSITIVE Sensitive     GENTAMICIN <=0.5 SENSITIVE Sensitive     OXACILLIN <=0.25 SENSITIVE Sensitive     TETRACYCLINE <=1 SENSITIVE Sensitive     VANCOMYCIN <=0.5 SENSITIVE Sensitive     TRIMETH/SULFA <=10 SENSITIVE Sensitive     CLINDAMYCIN <=0.25  SENSITIVE Sensitive     RIFAMPIN <=0.5 SENSITIVE Sensitive     Inducible Clindamycin NEGATIVE Sensitive     * FEW STAPHYLOCOCCUS AUREUS  Culture, blood (routine x 2)     Status: Abnormal   Collection Time: 06/13/17 11:59 AM  Result Value Ref Range Status   Specimen Description BLOOD LEFT ANTECUBITAL  Final   Special Requests IN PEDIATRIC BOTTLE Blood Culture adequate volume  Final   Culture  Setup Time   Final    GRAM POSITIVE COCCI IN CLUSTERS IN PEDIATRIC BOTTLE CRITICAL VALUE NOTED.  VALUE IS CONSISTENT WITH PREVIOUSLY REPORTED AND CALLED VALUE.    Culture (A)  Final    STAPHYLOCOCCUS AUREUS SUSCEPTIBILITIES PERFORMED ON PREVIOUS CULTURE WITHIN THE LAST 5 DAYS.    Report Status 06/15/2017 FINAL  Final  Culture, blood (routine x 2)     Status: Abnormal   Collection Time: 06/13/17 12:07 PM  Result Value Ref Range Status   Specimen Description BLOOD LEFT ANTECUBITAL  Final   Special Requests IN PEDIATRIC BOTTLE Blood Culture adequate volume  Final   Culture  Setup Time   Final    GRAM POSITIVE COCCI IN CLUSTERS IN PEDIATRIC BOTTLE CRITICAL RESULT CALLED TO, READ BACK BY AND VERIFIED WITH: T EGAN,PHARMD AT [id number] 06/14/17 BY L BENFIELD    Culture (A)  Final    STAPHYLOCOCCUS AUREUS SUSCEPTIBILITIES PERFORMED ON PREVIOUS CULTURE WITHIN THE LAST 5 DAYS.    Report Status 06/15/2017 FINAL  Final  Culture, blood (routine x 2)     Status: None (Preliminary result)   Collection Time: 06/14/17  9:10 AM  Result Value Ref Range Status   Specimen Description BLOOD RIGHT ARM  Final   Special Requests   Final    BOTTLES DRAWN AEROBIC ONLY Blood Culture adequate volume   Culture NO GROWTH 2 DAYS  Final   Report Status PENDING  Incomplete  Culture, blood (routine x 2)     Status: Abnormal   Collection Time: 06/14/17  9:20 AM  Result Value Ref  Range Status   Specimen Description BLOOD RIGHT HAND  Final   Special Requests IN PEDIATRIC BOTTLE Blood Culture adequate volume  Final   Culture   Setup Time   Final    GRAM POSITIVE COCCI IN CLUSTERS IN PEDIATRIC BOTTLE CRITICAL RESULT CALLED TO, READ BACK BY AND VERIFIED WITH: K HYATT,PHARMD AT 1610 06/15/17 BY L BENFIELD    Culture (A)  Final    STAPHYLOCOCCUS AUREUS SUSCEPTIBILITIES PERFORMED ON PREVIOUS CULTURE WITHIN THE LAST 5 DAYS.    Report Status 06/16/2017 FINAL  Final         Radiology Studies: No results found.      Scheduled Meds: . enoxaparin (LOVENOX) injection  40 mg Subcutaneous Q24H  . feeding supplement  1 Container Oral TID BM  . Influenza vac split quadrivalent PF  0.5 mL Intramuscular Tomorrow-1000  . pantoprazole  40 mg Oral Q1200  . senna-docusate  1 tablet Oral BID  . thiamine  100 mg Oral Daily   Continuous Infusions: . sodium chloride    . nafcillin IV 2 g (06/17/17 0957)     LOS: 8 days    Time spent:    Zannie Cove, MD [facility] Page via [email or web address], password [email or web address] After 7PM please contact night-coverage  06/17/2017, 11:33 AM

## 2017-06-17 NOTE — Plan of Care (Signed)
  Progressing Education: Knowledge of disease or condition will improve 06/17/2017 0343 - Progressing by Burtis Junesrewery, Tynslee Bowlds, RN Knowledge of secondary prevention will improve 06/17/2017 0343 - Progressing by Burtis Junesrewery, Jaunita Mikels, RN Knowledge of patient specific risk factors addressed and post discharge goals established will improve 06/17/2017 0343 - Progressing by Burtis Junesrewery, Ancil Dewan, RN Coping: Will verbalize positive feelings about self 06/17/2017 0343 - Progressing by Burtis Junesrewery, Garreth Burnsworth, RN Will identify appropriate support needs 06/17/2017 0343 - Progressing by Burtis Junesrewery, Chontel Warning, RN Health Behavior/Discharge Planning: Ability to manage health-related needs will improve 06/17/2017 0343 - Progressing by Burtis Junesrewery, Montrel Donahoe, RN Self-Care: Ability to participate in self-care as condition permits will improve 06/17/2017 0343 - Progressing by Burtis Junesrewery, Demar Shad, RN Verbalization of feelings and concerns over difficulty with self-care will improve 06/17/2017 0343 - Progressing by Burtis Junesrewery, Khori Underberg, RN Ability to communicate needs accurately will improve 06/17/2017 0343 - Progressing by Burtis Junesrewery, Kenyatte Chatmon, RN Nutrition: Risk of aspiration will decrease 06/17/2017 0343 - Progressing by Burtis Junesrewery, Tomica Arseneault, RN Dietary intake will improve 06/17/2017 0343 - Progressing by Burtis Junesrewery, Natashia Roseman, RN Ischemic Stroke/TIA Tissue Perfusion: Complications of ischemic stroke/TIA will be minimized 06/17/2017 0343 - Progressing by Burtis Junesrewery, Ravina Milner, RN

## 2017-06-17 NOTE — Progress Notes (Signed)
[facility] [place] [facility] antibiotic options provided by pharmacy- they state they would be able to take patient if on   Ampicillin 2 gm every 4 hours- 12 gm/day   CSW updated unit CSW who will continue to follow patient and ensure this antibiotic regimen is still approved by MDs  Burna SisJenna H. Kannon Granderson, LCSW Clinical Social Worker [phone]

## 2017-06-18 LAB — CBC
HCT: 26.2 % — ABNORMAL LOW (ref 39.0–52.0)
HEMOGLOBIN: 8.6 g/dL — AB (ref 13.0–17.0)
MCH: 28.1 pg (ref 26.0–34.0)
MCHC: 32.8 g/dL (ref 30.0–36.0)
MCV: 85.6 fL (ref 78.0–100.0)
PLATELETS: 316 10*3/uL (ref 150–400)
RBC: 3.06 MIL/uL — ABNORMAL LOW (ref 4.22–5.81)
RDW: 15.5 % (ref 11.5–15.5)
WBC: 10.6 10*3/uL — ABNORMAL HIGH (ref 4.0–10.5)

## 2017-06-18 LAB — GLUCOSE, CAPILLARY
GLUCOSE-CAPILLARY: 105 mg/dL — AB (ref 65–99)
GLUCOSE-CAPILLARY: 120 mg/dL — AB (ref 65–99)
GLUCOSE-CAPILLARY: 102 mg/dL — AB (ref 65–99)
GLUCOSE-CAPILLARY: 154 mg/dL — AB (ref 65–99)
Glucose-Capillary: 109 mg/dL — ABNORMAL HIGH (ref 65–99)
Glucose-Capillary: 114 mg/dL — ABNORMAL HIGH (ref 65–99)
Glucose-Capillary: 122 mg/dL — ABNORMAL HIGH (ref 65–99)

## 2017-06-18 LAB — BASIC METABOLIC PANEL
ANION GAP: 10 (ref 5–15)
BUN: 19 mg/dL (ref 6–20)
CO2: 22 mmol/L (ref 22–32)
Calcium: 7.8 mg/dL — ABNORMAL LOW (ref 8.9–10.3)
Chloride: 98 mmol/L — ABNORMAL LOW (ref 101–111)
Creatinine, Ser: 1.4 mg/dL — ABNORMAL HIGH (ref 0.61–1.24)
GFR calc Af Amer: >60 mL/min (ref >60)
GFR calc non Af Amer: 57 mL/min — ABNORMAL LOW (ref >60)
GLUCOSE: 138 mg/dL — AB (ref 65–99)
POTASSIUM: 2.9 mmol/L — AB (ref 3.5–5.1)
Sodium: 130 mmol/L — ABNORMAL LOW (ref 135–145)

## 2017-06-18 LAB — CK: CK TOTAL: 330 U/L (ref 49–397)

## 2017-06-18 MED ORDER — AMPICILLIN SODIUM 2 G IJ SOLR
2.0000 g | INTRAMUSCULAR | Status: DC
  Administered 2017-06-18 – 2017-06-23 (×30): 2 g via INTRAVENOUS
  Filled 2017-06-18 (×33): qty 2000

## 2017-06-18 MED ORDER — POTASSIUM CHLORIDE CRYS ER 20 MEQ PO TBCR
40.0000 meq | EXTENDED_RELEASE_TABLET | ORAL | Status: AC
  Administered 2017-06-18 (×3): 40 meq via ORAL
  Filled 2017-06-18 (×2): qty 2

## 2017-06-18 NOTE — Plan of Care (Signed)
  Education: Knowledge of General Education information will improve 06/18/2017 0539 - Progressing by Olena Materobinson, Laurenashley Viar G, RN Note POC reviewed with pt.- pt. forgetful at times.

## 2017-06-18 NOTE — Progress Notes (Signed)
PROGRESS NOTE    GURSHAN SETTLEMIRE  VWU:[id number] DOB: 1966/01/28 DOA: 06/08/2017 PCP: Patient, No Pcp Per  Brief Narrative: [facility]CC[facility] transfer [facility] to [facility] on [facility] 1/10 Briefly Mr. Iovino is a 52 year old male with history of polysubstance abuse and hep C was found down by his significant other, subsequently brought to the emergency room, he was obtunded and found to have severe metabolic encephalopathy, infective endocarditis, MSSA bacteremia, septic emboli/embolic strokes and left knee septic arthritis. He is being followed by infectious disease and orthopedics, now on IV nafcillin, underwent left knee I & D on 1/9 Still with Bacteremia, needs 6weeks of Nafcillin or Ampicillin from first negative cultures, hopefully 1/14 cultures stay negative   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 -History of polysubstance abuse however no clear history obtained to suggest IV drug use, could have started from Septic Knee -clinically improving, sepsis physiology resolved -Repeat blood cultures from 1/9 and 1/11 remain positive, blood cultures from 1/12, positive 1 in /2 -repeat Cx 1/14 negative x1day, if stays negative then place PICC line and DC to SNF for 6weeks of Abx until 2/25 -2D ECHO with mitral valve vegetation -ID following, plan for 6 weeks of IV Nafcillin or Ampicillin from first negative cultures -low grade temp last pm -overall improving clinically, debilitated from embolic strokes -PT/OT eval completed, SNF recommended -CSW following for rehabilitation options, Nafcillin changed to Ampicillin due to financial concerns  Left Knee Septic Arthritis -Culture w/ MSSA -s/p incision and debridement on 1/9 by Dr. Roda Shutters -stable, improving on Abx  Multiple embolic strokes -Secondary to infective endocarditis -With resultant left-sided weakness, left field cut, some dysarthria and dysphagia -Treatment for this is antibiotics  for infective endocarditis, continue Ampicillin  -continue physical and occupational therapy, SNF recommended -speech therapy evaluation completed, dysphagia 1 diet recommended at this time -Appreciate neurology input recommended follow-up with [facility] neurology in 4-6 weeks  Fluid overload/iatrogenic and third spacing from sepsis -ECHo with preserved EF -Improving, diuresed for 2days  -appears euvolemic now, hold lasix and monitor  Hepatitis C antibody positive -Genotype IIb with 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 -Follow-up with ID after acute illness is resolved  Polysubstance abuse -Per family history of substance abuse but no known history of IV drug use as far as they know -UDS positive for cocaine, opiates and benzos on admission -counseled  Acute kidney injury -Likely secondary to sepsis, creatinine was 3.0 on admission now down to 1.4 -Baseline unknown -Kidney function improved, likely new baseline  Thrombocytopenia -Baseline unknown -Likely worsened in the setting of sepsis -improved  DVT prophylaxis:  lovenox Code Status:  full code  Family Communication: dtr at bedside  Disposition Plan: SNF when bacteremia cleared and afebrile, possibly 48hours  Consultants:   ID  PCCM  Ortho   neurology  Procedures: PROCEDURE: Arthrotomy of left knee and irrigation and debridement for septic arthritis 1/9   2-D echocardiogram   Antimicrobials:  Antibiotics Given (last 72 hours)    Date/Time Action Medication Dose Rate   06/15/17 1423 New Bag/Given   nafcillin 2 g in dextrose 5 % 100 mL IVPB 2 g 200 mL/hr   06/15/17 1840 New Bag/Given   nafcillin 2 g in dextrose 5 % 100 mL IVPB 2 g 200 mL/hr   06/15/17 2104 New Bag/Given   nafcillin 2 g in dextrose 5 % 100 mL IVPB 2 g 200 mL/hr   06/16/17 0106 New Bag/Given   nafcillin 2 g in  dextrose 5 % 100 mL IVPB 2 g 200 mL/hr   06/16/17 0551 New Bag/Given   nafcillin 2 g in dextrose 5 % 100 mL IVPB 2 g 200 mL/hr   06/16/17 0941 New  Bag/Given   nafcillin 2 g in dextrose 5 % 100 mL IVPB 2 g 200 mL/hr   06/16/17 1408 New Bag/Given   nafcillin 2 g in dextrose 5 % 100 mL IVPB 2 g 200 mL/hr   06/16/17 1806 New Bag/Given   nafcillin 2 g in dextrose 5 % 100 mL IVPB 2 g 200 mL/hr   06/16/17 2140 New Bag/Given   nafcillin 2 g in dextrose 5 % 100 mL IVPB 2 g 200 mL/hr   06/17/17 0137 New Bag/Given   nafcillin 2 g in dextrose 5 % 100 mL IVPB 2 g 200 mL/hr   06/17/17 0602 New Bag/Given   nafcillin 2 g in dextrose 5 % 100 mL IVPB 2 g 200 mL/hr   06/17/17 0957 New Bag/Given   nafcillin 2 g in dextrose 5 % 100 mL IVPB 2 g 200 mL/hr   06/17/17 1458 New Bag/Given   nafcillin 2 g in dextrose 5 % 100 mL IVPB 2 g 200 mL/hr   06/17/17 1812 New Bag/Given   nafcillin 2 g in dextrose 5 % 100 mL IVPB 2 g 200 mL/hr   06/17/17 2125 New Bag/Given   nafcillin 2 g in dextrose 5 % 100 mL IVPB 2 g 200 mL/hr   06/18/17 0231 New Bag/Given   nafcillin 2 g in dextrose 5 % 100 mL IVPB 2 g 200 mL/hr   06/18/17 0527 New Bag/Given   nafcillin 2 g in dextrose 5 % 100 mL IVPB 2 g 200 mL/hr      Subjective: -low grade temp last night, feels better this am, breathing better, finally had a BM last night, eating some   Objective: Vitals:   06/17/17 1530 06/17/17 1814 06/17/17 2322 06/18/17 0501  BP: (!) 114/55  130/61   Pulse: (!) 125  (!) 110 (!) 105  Resp: (!) 28  19 18   Temp: (!) 101.1 F (38.4 C) (!) 100.4 F (38 C) 100.3 F (37.9 C) 98.2 F (36.8 C)  TempSrc: Oral Oral Oral Oral  SpO2: 98%  97% 99%  Weight:    75.2 kg (165 lb 12.6 oz)  Height:        Intake/Output Summary (Last 24 hours) at 06/18/2017 1032 Last data filed at 06/18/2017 0645 Gross per 24 hour  Intake 1310 ml  Output 375 ml  Net 935 ml   Filed Weights   06/16/17 0500 06/17/17 0503 06/18/17 0501  Weight: 80.1 kg (176 lb 9.4 oz) 77.8 kg (171 lb 8.3 oz) 75.2 kg (165 lb 12.6 oz)    Examination: Gen: Awake, Alert, Oriented X 3, ill appearing HEENT: no JVD, poor  dental hygiene Lungs: decreased at bases CVS: RRR,No Gallops,Rubs or new Murmurs Abd: soft, Non tender, non distended, BS present Extremities: Left knee with dressing, trace edema Skin: no new rashes  neuro left hemiparesis      Data Reviewed:   CBC: Recent Labs  Lab 06/13/17 0140 06/15/17 0232 06/16/17 0235 06/18/17 0305  WBC 13.1* 13.2* 16.0* 10.6*  HGB 8.9* 8.9* 9.4* 8.6*  HCT 26.3* 27.1* 28.5* 26.2*  MCV 82.2 84.2 84.6 85.6  PLT 142* 288 319 316   Basic Metabolic Panel: Recent Labs  Lab 06/13/17 0834 06/14/17 0752 06/15/17 0232 06/16/17 0235 06/17/17 0232 06/18/17 0305  NA  --  130* 131* 133* 132* 130*  K  --  3.1* 3.4* 3.3* 3.3* 2.9*  CL  --  104 103 100* 98* 98*  CO2  --  19* 18* 23 25 22   GLUCOSE  --  117* 169* 122* 123* 138*  BUN  --  15 14 16 18 19   CREATININE  --  1.22 1.37* 1.37* 1.38* 1.40*  CALCIUM  --  6.8* 7.1* 7.5* 7.2* 7.8*  MG 1.7  --   --   --   --   --    GFR: Estimated Creatinine Clearance: 66.4 mL/min (A) (by C-G formula based on SCr of 1.4 mg/dL (H)). Liver Function Tests: No results for input(s): AST, ALT, ALKPHOS, BILITOT, PROT, ALBUMIN in the last 168 hours. No results for input(s): LIPASE, AMYLASE in the last 168 hours. No results for input(s): AMMONIA in the last 168 hours. Coagulation Profile: No results for input(s): INR, PROTIME in the last 168 hours. Cardiac Enzymes: Recent Labs  Lab 06/18/17 0305  CKTOTAL 330   BNP (last 3 results) No results for input(s): PROBNP in the last 8760 hours. HbA1C: No results for input(s): HGBA1C in the last 72 hours. CBG: Recent Labs  Lab 06/17/17 1735 06/17/17 2014 06/18/17 0055 06/18/17 0616 06/18/17 0818  GLUCAP 153* 135* 109* 114* 105*   Lipid Profile: No results for input(s): CHOL, HDL, LDLCALC, TRIG, CHOLHDL, LDLDIRECT in the last 72 hours. Thyroid Function Tests: No results for input(s): TSH, T4TOTAL, FREET4, T3FREE, THYROIDAB in the last 72 hours. Anemia Panel: No  results for input(s): VITAMINB12, FOLATE, FERRITIN, TIBC, IRON, RETICCTPCT in the last 72 hours. Urine analysis:    Component Value Date/Time   COLORURINE AMBER (A) 06/08/2017 1724   APPEARANCEUR CLOUDY (A) 06/08/2017 1724   LABSPEC 1.016 06/08/2017 1724   PHURINE 5.0 06/08/2017 1724   GLUCOSEU NEGATIVE 06/08/2017 1724   HGBUR LARGE (A) 06/08/2017 1724   BILIRUBINUR NEGATIVE 06/08/2017 1724   KETONESUR NEGATIVE 06/08/2017 1724   PROTEINUR NEGATIVE 06/08/2017 1724   NITRITE NEGATIVE 06/08/2017 1724   LEUKOCYTESUR NEGATIVE 06/08/2017 1724   Sepsis Labs: @LABRCNTIP (procalcitonin:4,lacticidven:4)  ) Recent Results (from the past 240 hour(s))  Culture, blood (routine x 2)     Status: Abnormal   Collection Time: 06/08/17  5:30 PM  Result Value Ref Range Status   Specimen Description BLOOD LEFT ARM  Final   Special Requests IN PEDIATRIC BOTTLE Blood Culture adequate volume  Final   Culture  Setup Time   Final    GRAM POSITIVE COCCI IN CLUSTERS IN PEDIATRIC BOTTLE CRITICAL VALUE NOTED.  VALUE IS CONSISTENT WITH PREVIOUSLY REPORTED AND CALLED VALUE.    Culture (A)  Final    STAPHYLOCOCCUS AUREUS SUSCEPTIBILITIES PERFORMED ON PREVIOUS CULTURE WITHIN THE LAST 5 DAYS.    Report Status 06/11/2017 FINAL  Final  Culture, blood (routine x 2)     Status: Abnormal   Collection Time: 06/08/17  5:46 PM  Result Value Ref Range Status   Specimen Description BLOOD RIGHT ANTECUBITAL  Final   Special Requests   Final    BOTTLES DRAWN AEROBIC AND ANAEROBIC Blood Culture adequate volume   Culture  Setup Time   Final    IN BOTH AEROBIC AND ANAEROBIC BOTTLES GRAM POSITIVE COCCI IN CLUSTERS CRITICAL RESULT CALLED TO, READ BACK BY AND VERIFIED WITH: J.LEDFORD,PHARMD 1610 06/09/17 M.CAMPBELL    Culture STAPHYLOCOCCUS AUREUS (A)  Final   Report Status 06/11/2017 FINAL  Final   Organism ID, Bacteria STAPHYLOCOCCUS AUREUS  Final  Susceptibility   Staphylococcus aureus - MIC*    CIPROFLOXACIN  <=0.5 SENSITIVE Sensitive     ERYTHROMYCIN <=0.25 SENSITIVE Sensitive     GENTAMICIN <=0.5 SENSITIVE Sensitive     OXACILLIN <=0.25 SENSITIVE Sensitive     TETRACYCLINE <=1 SENSITIVE Sensitive     VANCOMYCIN <=0.5 SENSITIVE Sensitive     TRIMETH/SULFA <=10 SENSITIVE Sensitive     CLINDAMYCIN <=0.25 SENSITIVE Sensitive     RIFAMPIN <=0.5 SENSITIVE Sensitive     Inducible Clindamycin NEGATIVE Sensitive     * STAPHYLOCOCCUS AUREUS  Blood Culture ID Panel (Reflexed)     Status: Abnormal   Collection Time: 06/08/17  5:46 PM  Result Value Ref Range Status   Enterococcus species NOT DETECTED NOT DETECTED Final   Listeria monocytogenes NOT DETECTED NOT DETECTED Final   Staphylococcus species DETECTED (A) NOT DETECTED Final    Comment: CRITICAL RESULT CALLED TO, READ BACK BY AND VERIFIED WITH: J.LEDFORD,PHARMD 1610 06/09/17 M.CAMPBELL    Staphylococcus aureus DETECTED (A) NOT DETECTED Final    Comment: Methicillin (oxacillin) susceptible Staphylococcus aureus (MSSA). Preferred therapy is anti staphylococcal beta lactam antibiotic (Cefazolin or Nafcillin), unless clinically contraindicated. CRITICAL RESULT CALLED TO, READ BACK BY AND VERIFIED WITH: J.LEDFORD,PHARMD [id number] 06/09/17 M.CAMPBELL    Methicillin resistance NOT DETECTED NOT DETECTED Final   Streptococcus species NOT DETECTED NOT DETECTED Final   Streptococcus agalactiae NOT DETECTED NOT DETECTED Final   Streptococcus pneumoniae NOT DETECTED NOT DETECTED Final   Streptococcus pyogenes NOT DETECTED NOT DETECTED Final   Acinetobacter baumannii NOT DETECTED NOT DETECTED Final   Enterobacteriaceae species NOT DETECTED NOT DETECTED Final   Enterobacter cloacae complex NOT DETECTED NOT DETECTED Final   Escherichia coli NOT DETECTED NOT DETECTED Final   Klebsiella oxytoca NOT DETECTED NOT DETECTED Final   Klebsiella pneumoniae NOT DETECTED NOT DETECTED Final   Proteus species NOT DETECTED NOT DETECTED Final   Serratia marcescens NOT  DETECTED NOT DETECTED Final   Haemophilus influenzae NOT DETECTED NOT DETECTED Final   Neisseria meningitidis NOT DETECTED NOT DETECTED Final   Pseudomonas aeruginosa NOT DETECTED NOT DETECTED Final   Candida albicans NOT DETECTED NOT DETECTED Final   Candida glabrata NOT DETECTED NOT DETECTED Final   Candida krusei NOT DETECTED NOT DETECTED Final   Candida parapsilosis NOT DETECTED NOT DETECTED Final   Candida tropicalis NOT DETECTED NOT DETECTED Final  Urine culture     Status: Abnormal   Collection Time: 06/08/17  7:46 PM  Result Value Ref Range Status   Specimen Description URINE, CLEAN CATCH  Final   Special Requests Normal  Final   Culture >=100,000 COLONIES/mL STAPHYLOCOCCUS AUREUS (A)  Final   Report Status 06/11/2017 FINAL  Final   Organism ID, Bacteria STAPHYLOCOCCUS AUREUS (A)  Final      Susceptibility   Staphylococcus aureus - MIC*    CIPROFLOXACIN <=0.5 SENSITIVE Sensitive     GENTAMICIN <=0.5 SENSITIVE Sensitive     NITROFURANTOIN 32 SENSITIVE Sensitive     OXACILLIN <=0.25 SENSITIVE Sensitive     TETRACYCLINE <=1 SENSITIVE Sensitive     VANCOMYCIN 1 SENSITIVE Sensitive     TRIMETH/SULFA <=10 SENSITIVE Sensitive     CLINDAMYCIN <=0.25 SENSITIVE Sensitive     RIFAMPIN <=0.5 SENSITIVE Sensitive     Inducible Clindamycin NEGATIVE Sensitive     * >=100,000 COLONIES/mL STAPHYLOCOCCUS AUREUS  Respiratory Panel by PCR     Status: None   Collection Time: 06/08/17  7:46 PM  Result Value Ref Range Status   Adenovirus  NOT DETECTED NOT DETECTED Final   Coronavirus 229E NOT DETECTED NOT DETECTED Final   Coronavirus HKU1 NOT DETECTED NOT DETECTED Final   Coronavirus NL63 NOT DETECTED NOT DETECTED Final   Coronavirus OC43 NOT DETECTED NOT DETECTED Final   Metapneumovirus NOT DETECTED NOT DETECTED Final   Rhinovirus / Enterovirus NOT DETECTED NOT DETECTED Final   Influenza A NOT DETECTED NOT DETECTED Final   Influenza B NOT DETECTED NOT DETECTED Final   Parainfluenza Virus 1  NOT DETECTED NOT DETECTED Final   Parainfluenza Virus 2 NOT DETECTED NOT DETECTED Final   Parainfluenza Virus 3 NOT DETECTED NOT DETECTED Final   Parainfluenza Virus 4 NOT DETECTED NOT DETECTED Final   Respiratory Syncytial Virus NOT DETECTED NOT DETECTED Final   Bordetella pertussis NOT DETECTED NOT DETECTED Final   Chlamydophila pneumoniae NOT DETECTED NOT DETECTED Final   Mycoplasma pneumoniae NOT DETECTED NOT DETECTED Final  CSF culture     Status: None   Collection Time: 06/08/17 11:00 PM  Result Value Ref Range Status   Specimen Description CSF  Final   Special Requests Normal  Final   Culture NO GROWTH 3 DAYS  Final   Report Status 06/12/2017 FINAL  Final  Gram stain     Status: None   Collection Time: 06/08/17 11:00 PM  Result Value Ref Range Status   Specimen Description CSF  Final   Special Requests Normal  Final   Gram Stain   Final    CYTOSPIN SLIDE WBC PRESENT,BOTH PMN AND MONONUCLEAR NO ORGANISMS SEEN    Report Status 06/08/2017 FINAL  Final  Culture, blood (single)     Status: None   Collection Time: 06/08/17 11:30 PM  Result Value Ref Range Status   Specimen Description BLOOD LEFT FOREARM  Final   Special Requests   Final    BOTTLES DRAWN AEROBIC AND ANAEROBIC Blood Culture adequate volume   Culture NO GROWTH 5 DAYS  Final   Report Status 06/14/2017 FINAL  Final  MRSA PCR Screening     Status: None   Collection Time: 06/09/17  3:15 AM  Result Value Ref Range Status   MRSA by PCR NEGATIVE NEGATIVE Final    Comment:        The GeneXpert MRSA Assay (FDA approved for NASAL specimens only), is one component of a comprehensive MRSA colonization surveillance program. It is not intended to diagnose MRSA infection nor to guide or monitor treatment for MRSA infections.   Body fluid culture     Status: None   Collection Time: 06/10/17  1:13 PM  Result Value Ref Range Status   Specimen Description SYNOVIAL LEFT KNEE  Final   Special Requests Normal  Final    Gram Stain   Final    FEW WBC PRESENT, PREDOMINANTLY PMN RARE GRAM POSITIVE COCCI IN PAIRS    Culture FEW STAPHYLOCOCCUS AUREUS  Final   Report Status 06/13/2017 FINAL  Final   Organism ID, Bacteria STAPHYLOCOCCUS AUREUS  Final      Susceptibility   Staphylococcus aureus - MIC*    CIPROFLOXACIN <=0.5 SENSITIVE Sensitive     ERYTHROMYCIN <=0.25 SENSITIVE Sensitive     GENTAMICIN <=0.5 SENSITIVE Sensitive     OXACILLIN <=0.25 SENSITIVE Sensitive     TETRACYCLINE <=1 SENSITIVE Sensitive     VANCOMYCIN <=0.5 SENSITIVE Sensitive     TRIMETH/SULFA <=10 SENSITIVE Sensitive     CLINDAMYCIN <=0.25 SENSITIVE Sensitive     RIFAMPIN <=0.5 SENSITIVE Sensitive     Inducible Clindamycin NEGATIVE  Sensitive     PENICILLIN Value in next row Sensitive      SENSITIVE0.06    * FEW STAPHYLOCOCCUS AUREUS  Surgical PCR screen     Status: Abnormal   Collection Time: 06/11/17  1:18 PM  Result Value Ref Range Status   MRSA, PCR NEGATIVE NEGATIVE Final   Staphylococcus aureus POSITIVE (A) NEGATIVE Final    Comment: (NOTE) The Xpert SA Assay (FDA approved for NASAL specimens in patients 52 years of age and older), is one component of a comprehensive surveillance program. It is not intended to diagnose infection nor to guide or monitor treatment.   Aerobic/Anaerobic Culture (surgical/deep wound)     Status: None   Collection Time: 06/11/17  3:21 PM  Result Value Ref Range Status   Specimen Description ABSCESS LEFT KNEE  Final   Special Requests NONE  Final   Gram Stain   Final    RARE WBC PRESENT, PREDOMINANTLY PMN RARE GRAM POSITIVE COCCI IN PAIRS    Culture FEW STAPHYLOCOCCUS AUREUS NO ANAEROBES ISOLATED   Final   Report Status 06/16/2017 FINAL  Final   Organism ID, Bacteria STAPHYLOCOCCUS AUREUS  Final      Susceptibility   Staphylococcus aureus - MIC*    CIPROFLOXACIN <=0.5 SENSITIVE Sensitive     ERYTHROMYCIN <=0.25 SENSITIVE Sensitive     GENTAMICIN <=0.5 SENSITIVE Sensitive     OXACILLIN  <=0.25 SENSITIVE Sensitive     TETRACYCLINE <=1 SENSITIVE Sensitive     VANCOMYCIN <=0.5 SENSITIVE Sensitive     TRIMETH/SULFA <=10 SENSITIVE Sensitive     CLINDAMYCIN <=0.25 SENSITIVE Sensitive     RIFAMPIN <=0.5 SENSITIVE Sensitive     Inducible Clindamycin NEGATIVE Sensitive     * FEW STAPHYLOCOCCUS AUREUS  Culture, blood (routine x 2)     Status: Abnormal   Collection Time: 06/13/17 11:59 AM  Result Value Ref Range Status   Specimen Description BLOOD LEFT ANTECUBITAL  Final   Special Requests IN PEDIATRIC BOTTLE Blood Culture adequate volume  Final   Culture  Setup Time   Final    GRAM POSITIVE COCCI IN CLUSTERS IN PEDIATRIC BOTTLE CRITICAL VALUE NOTED.  VALUE IS CONSISTENT WITH PREVIOUSLY REPORTED AND CALLED VALUE.    Culture (A)  Final    STAPHYLOCOCCUS AUREUS SUSCEPTIBILITIES PERFORMED ON PREVIOUS CULTURE WITHIN THE LAST 5 DAYS.    Report Status 06/15/2017 FINAL  Final  Culture, blood (routine x 2)     Status: Abnormal   Collection Time: 06/13/17 12:07 PM  Result Value Ref Range Status   Specimen Description BLOOD LEFT ANTECUBITAL  Final   Special Requests IN PEDIATRIC BOTTLE Blood Culture adequate volume  Final   Culture  Setup Time   Final    GRAM POSITIVE COCCI IN CLUSTERS IN PEDIATRIC BOTTLE CRITICAL RESULT CALLED TO, READ BACK BY AND VERIFIED WITH: T EGAN,PHARMD AT [id number] 06/14/17 BY L BENFIELD    Culture (A)  Final    STAPHYLOCOCCUS AUREUS SUSCEPTIBILITIES PERFORMED ON PREVIOUS CULTURE WITHIN THE LAST 5 DAYS.    Report Status 06/15/2017 FINAL  Final  Culture, blood (routine x 2)     Status: None (Preliminary result)   Collection Time: 06/14/17  9:10 AM  Result Value Ref Range Status   Specimen Description BLOOD RIGHT ARM  Final   Special Requests   Final    BOTTLES DRAWN AEROBIC ONLY Blood Culture adequate volume   Culture NO GROWTH 3 DAYS  Final   Report Status PENDING  Incomplete  Culture, blood (  routine x 2)     Status: Abnormal   Collection Time: 06/14/17   9:20 AM  Result Value Ref Range Status   Specimen Description BLOOD RIGHT HAND  Final   Special Requests IN PEDIATRIC BOTTLE Blood Culture adequate volume  Final   Culture  Setup Time   Final    GRAM POSITIVE COCCI IN CLUSTERS IN PEDIATRIC BOTTLE CRITICAL RESULT CALLED TO, READ BACK BY AND VERIFIED WITH: K HYATT,PHARMD AT 1610 06/15/17 BY L BENFIELD    Culture (A)  Final    STAPHYLOCOCCUS AUREUS SUSCEPTIBILITIES PERFORMED ON PREVIOUS CULTURE WITHIN THE LAST 5 DAYS.    Report Status 06/16/2017 FINAL  Final  Culture, blood (routine x 2)     Status: None (Preliminary result)   Collection Time: 06/16/17  9:57 AM  Result Value Ref Range Status   Specimen Description BLOOD RIGHT HAND  Final   Special Requests   Final    BOTTLES DRAWN AEROBIC AND ANAEROBIC Blood Culture adequate volume   Culture NO GROWTH 1 DAY  Final   Report Status PENDING  Incomplete  Culture, blood (routine x 2)     Status: None (Preliminary result)   Collection Time: 06/16/17 10:00 AM  Result Value Ref Range Status   Specimen Description BLOOD  Final   Special Requests   Final    BOTTLES DRAWN AEROBIC AND ANAEROBIC Blood Culture adequate volume   Culture NO GROWTH 1 DAY  Final   Report Status PENDING  Incomplete         Radiology Studies: No results found.      Scheduled Meds: . enoxaparin (LOVENOX) injection  40 mg Subcutaneous Q24H  . feeding supplement  1 Container Oral TID BM  . Influenza vac split quadrivalent PF  0.5 mL Intramuscular Tomorrow-1000  . pantoprazole  40 mg Oral Q1200  . potassium chloride  40 mEq Oral Q2H  . senna-docusate  1 tablet Oral BID  . thiamine  100 mg Oral Daily   Continuous Infusions: . sodium chloride    . ampicillin (OMNIPEN) IV       LOS: 9 days    Time spent:    Zannie Cove, MD [facility] Page via [email or web address], password [email or web address] After 7PM please contact night-coverage  06/18/2017, 10:32 AM

## 2017-06-18 NOTE — Progress Notes (Signed)
  Speech Language Pathology Treatment: Dysphagia  Patient Details Name: Marcus [name] MRN: [id number] DOB: 01/24/1966 Today's Date: 06/18/2017 Time: [id number] SLP Time Calculation (min) (ACUTE ONLY): 15 min  Assessment / Plan / Recommendation Clinical Impression  Skilled treatment session focused on dysphagia goals. Pt was sleeping in bed when SLP entered room. Pt able to arouse and tolerated repositioning in bed for safety with PO intake. Pt agreeable to trial of graham cracker. Pt with fair bolus manipulation and slight increase in oral phase but he demonstrated complete oral clearing. After 2 bites, pt refused any further trials. Would like to assess pt with more trials of advanced diet textures before progressing diet further. Pt was left upright in bed with son present. Son states that pt "didn't chew his breakfast much" (dysphagia 1 breakfast).    HPI HPI: This is a 52 yo man with no notable past medical history, here with altered mental status. Hsitory obtained from EDP. No family at bedside. Pt not following commands but winces to pain.  Per chart review, pt was last seen normal by his girlfriend at 12:00, and later she found him on the ground, on his right side, unresponsive. He was tachycardic in ED. He has history fo opiate use- UDS was positive for cocaine and opiates and benzo.  MRI is showing numerous small foci of acute/early sub-acute infarct in right greater then left posterior hemipheres, basal ganglia, brainstem and cerebellum.  Chest xray is showing no active disease.        SLP Plan  Continue with current plan of care       Recommendations  Diet recommendations: Dysphagia 1 (puree);Thin liquid Liquids provided via: Cup;Straw Medication Administration: Whole meds with puree Supervision: Staff to assist with self feeding;Full supervision/cueing for compensatory strategies Compensations: Minimize environmental distractions;Slow rate;Small sips/bites;Lingual sweep for clearance  of pocketing Postural Changes and/or Swallow Maneuvers: Seated upright 90 degrees                Oral Care Recommendations: Oral care BID Follow up Recommendations: Skilled Nursing facility SLP Visit Diagnosis: Dysphagia, oropharyngeal phase (R13.12) Plan: Continue with current plan of care       GO                Kadir Azucena 06/18/2017, 12:41 PM

## 2017-06-19 DIAGNOSIS — E440 Moderate protein-calorie malnutrition: Secondary | ICD-10-CM

## 2017-06-19 DIAGNOSIS — L8990 Pressure ulcer of unspecified site, unspecified stage: Secondary | ICD-10-CM

## 2017-06-19 HISTORY — DX: Moderate protein-calorie malnutrition: E44.0

## 2017-06-19 LAB — GLUCOSE, CAPILLARY
GLUCOSE-CAPILLARY: 94 mg/dL (ref 65–99)
GLUCOSE-CAPILLARY: 149 mg/dL — AB (ref 65–99)
GLUCOSE-CAPILLARY: 124 mg/dL — AB (ref 65–99)
Glucose-Capillary: 115 mg/dL — ABNORMAL HIGH (ref 65–99)
Glucose-Capillary: 99 mg/dL (ref 65–99)

## 2017-06-19 LAB — RENAL FUNCTION PANEL
Albumin: 1.2 g/dL — ABNORMAL LOW (ref 3.5–5.0)
Anion gap: 11 (ref 5–15)
BUN: 16 mg/dL (ref 6–20)
CO2: 23 mmol/L (ref 22–32)
Calcium: 7.9 mg/dL — ABNORMAL LOW (ref 8.9–10.3)
Chloride: 100 mmol/L — ABNORMAL LOW (ref 101–111)
Creatinine, Ser: 1.22 mg/dL (ref 0.61–1.24)
GFR calc Af Amer: >60 mL/min (ref >60)
GFR calc non Af Amer: >60 mL/min (ref >60)
Glucose, Bld: 110 mg/dL — ABNORMAL HIGH (ref 65–99)
Phosphorus: 3.7 mg/dL (ref 2.5–4.6)
Potassium: 3.2 mmol/L — ABNORMAL LOW (ref 3.5–5.1)
Sodium: 134 mmol/L — ABNORMAL LOW (ref 135–145)

## 2017-06-19 LAB — CULTURE, BLOOD (ROUTINE X 2)
Culture: NO GROWTH
SPECIAL REQUESTS: ADEQUATE

## 2017-06-19 LAB — MAGNESIUM: Magnesium: 1.8 mg/dL (ref 1.7–2.4)

## 2017-06-19 MED ORDER — POTASSIUM CHLORIDE CRYS ER 20 MEQ PO TBCR
40.0000 meq | EXTENDED_RELEASE_TABLET | ORAL | Status: AC
  Administered 2017-06-19 (×2): 40 meq via ORAL
  Filled 2017-06-19 (×2): qty 2

## 2017-06-19 NOTE — Progress Notes (Signed)
PROGRESS NOTE    MCDONALD REILING  ZOX:[id number] DOB: 1965/10/26 DOA: 06/08/2017 PCP: Patient, No Pcp Per  Brief Narrative: P[facility] transfer [facility] to [facility] the 1/10 Patient is a 52 year old Caucasian male with past medical history significant for polysubstance abuse and hep C. Patient was found down by his significant other and, subsequently, brought to the emergency room. On presentation, patient was obtunded, and found to have severe metabolic encephalopathy, infective endocarditis, MSSA bacteremia, septic emboli/embolic strokes and left knee septic arthritis. Patient is being followed by infectious disease and orthopedic teams. Patient is currently on IV Ampicillin. Patient  underwent left knee I & D on 1/9. Patient needs of IV Ampicillin from first negative cultures. Blood cultures done on 06/16/17 have not grown any organisms till date.  Will proceed with PiccLine placement.   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 -History of polysubstance abuse however no clear history obtained to suggest IV drug use, could have started from Septic Knee -clinically improving, sepsis physiology resolved -Repeat blood cultures from 1/9 and 1/11 remain positive, blood cultures from 1/12, positive 1 in /2 -repeat Cx 1/14 negative x1day, if stays negative then place PICC line and DC to SNF for 6weeks of Abx until 2/25 -2D ECHO with mitral valve vegetation -ID following, plan for 6 weeks of IV Nafcillin or Ampicillin from first negative cultures -low grade temp last pm -overall improving clinically, debilitated from embolic strokes -PT/OT eval completed, SNF recommended -CSW following for rehabilitation options, Nafcillin changed to Ampicillin due to financial concerns - Cultures done on 06/16/17 have not grown any organisms - For piccline placement today.  Left Knee Septic Arthritis -Culture w/ MSSA -s/p incision and  debridement on 1/9 by Dr. Roda Shutters -stable, improving on Abx  Multiple embolic strokes -Secondary to infective endocarditis -With resultant left-sided weakness, left field cut, some dysarthria and dysphagia -Treatment for this is antibiotics for infective endocarditis, continue Ampicillin  -continue physical and occupational therapy, SNF recommended -speech therapy evaluation completed, dysphagia 1 diet recommended at this time -Appreciate neurology input recommended follow-up with [facility] neurology in 4-6 weeks  Fluid overload/iatrogenic and third spacing from sepsis -ECHo with preserved EF -Improving, diuresed for 2days  -appears euvolemic. Lasix is on hold.  Abnormal electrolytes (Hypokalemia and Hyponatremia) - Renal panel stat - Telemetry monitoring due to severe hypokalemia - Likely related to diuretics therapy - Will check urine sodium and magnesium level.  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 - Resolved  DVT prophylaxis:  lovenox Code Status:  full code  Family Communication: dtr at bedside  Disposition Plan: SNF    Consultants:   ID  PCCM  Ortho   neurology  Procedures: PROCEDURE: Arthrotomy of left knee and irrigation and debridement for septic arthritis 1/9   2-D echocardiogram   Antimicrobials:  Antibiotics Given (last 72 hours)    Date/Time Action Medication Dose Rate   06/16/17 0941 New Bag/Given   nafcillin 2 g in dextrose 5 %  100 mL IVPB  2 g 200 mL/hr   06/16/17 1408 New Bag/Given   nafcillin 2 g in dextrose 5 % 100 mL IVPB 2 g 200 mL/hr   06/16/17 1806 New Bag/Given   nafcillin 2 g in dextrose 5 % 100 mL IVPB 2 g 200 mL/hr   06/16/17 2140 New Bag/Given   nafcillin 2 g in dextrose 5 % 100 mL IVPB 2 g 200 mL/hr   06/17/17 0137 New Bag/Given   nafcillin 2 g in dextrose 5 % 100 mL IVPB 2 g 200 mL/hr   06/17/17 0602 New Bag/Given   nafcillin 2 g in dextrose 5 % 100 mL IVPB 2 g 200 mL/hr   06/17/17 0957 New Bag/Given   nafcillin 2 g in dextrose 5 % 100 mL IVPB 2 g 200 mL/hr   06/17/17 1458 New Bag/Given   nafcillin 2 g in dextrose 5 % 100 mL IVPB 2 g 200 mL/hr   06/17/17 1812 New Bag/Given   nafcillin 2 g in dextrose 5 % 100 mL IVPB 2 g 200 mL/hr   06/17/17 2125 New Bag/Given   nafcillin 2 g in dextrose 5 % 100 mL IVPB 2 g 200 mL/hr   06/18/17 0231 New Bag/Given   nafcillin 2 g in dextrose 5 % 100 mL IVPB 2 g 200 mL/hr   06/18/17 0527 New Bag/Given   nafcillin 2 g in dextrose 5 % 100 mL IVPB 2 g 200 mL/hr   06/18/17 1303 New Bag/Given   ampicillin (OMNIPEN) 2 g in sodium chloride 0.9 % 50 mL IVPB 2 g 150 mL/hr   06/18/17 1659 New Bag/Given   ampicillin (OMNIPEN) 2 g in sodium chloride 0.9 % 50 mL IVPB 2 g 150 mL/hr   06/18/17 2055 New Bag/Given   ampicillin (OMNIPEN) 2 g in sodium chloride 0.9 % 50 mL IVPB 2 g 150 mL/hr   06/18/17 2333 New Bag/Given   ampicillin (OMNIPEN) 2 g in sodium chloride 0.9 % 50 mL IVPB 2 g 150 mL/hr   06/19/17 0436 New Bag/Given   ampicillin (OMNIPEN) 2 g in sodium chloride 0.9 % 50 mL IVPB 2 g 150 mL/hr   06/19/17 9604 New Bag/Given   ampicillin (OMNIPEN) 2 g in sodium chloride 0.9 % 50 mL IVPB 2 g 150 mL/hr      Subjective: - No fever today. - Not yet at baseline   Objective: Vitals:   06/18/17 0501 06/18/17 1526 06/18/17 2058 06/19/17 0647  BP:  (!) 116/53 118/63 126/75  Pulse: (!) 105 (!) 104 (!) 104 (!) 101  Resp: 18 20 18 17   Temp: 98.2 F (36.8 C) 98.4 F (36.9 C) 99.5 F  (37.5 C) 98.3 F (36.8 C)  TempSrc: Oral  Oral Oral  SpO2: 99% 100% 100% 100%  Weight: 75.2 kg (165 lb 12.6 oz)     Height:        Intake/Output Summary (Last 24 hours) at 06/19/2017 0938 Last data filed at 06/19/2017 0900 Gross per 24 hour  Intake 300 ml  Output 1550 ml  Net -1250 ml   Filed Weights   06/16/17 0500 06/17/17 0503 06/18/17 0501  Weight: 80.1 kg (176 lb 9.4 oz) 77.8 kg (171 lb 8.3 oz) 75.2 kg (165 lb 12.6 oz)    Examination: Gen: Awake, Alert, Oriented X 3, weak looking. Mild facial asymmetry. HEENT: no JVD, poor dental hygiene Lungs: decreased at bases CVS: RRR,No Gallops,Rubs or new Murmurs Abd: soft, Non tender, non distended, BS present Extremities: Left  knee with dressing, trace edema Skin: no new rashes  neuro left hemiparesis. Facial asymmetry.   Data Reviewed:   CBC: Recent Labs  Lab 06/13/17 0140 06/15/17 0232 06/16/17 0235 06/18/17 0305  WBC 13.1* 13.2* 16.0* 10.6*  HGB 8.9* 8.9* 9.4* 8.6*  HCT 26.3* 27.1* 28.5* 26.2*  MCV 82.2 84.2 84.6 85.6  PLT 142* 288 319 316   Basic Metabolic Panel: Recent Labs  Lab 06/13/17 0834 06/14/17 0752 06/15/17 0232 06/16/17 0235 06/17/17 0232 06/18/17 0305  NA  --  130* 131* 133* 132* 130*  K  --  3.1* 3.4* 3.3* 3.3* 2.9*  CL  --  104 103 100* 98* 98*  CO2  --  19* 18* 23 25 22   GLUCOSE  --  117* 169* 122* 123* 138*  BUN  --  15 14 16 18 19   CREATININE  --  1.22 1.37* 1.37* 1.38* 1.40*  CALCIUM  --  6.8* 7.1* 7.5* 7.2* 7.8*  MG 1.7  --   --   --   --   --    GFR: Estimated Creatinine Clearance: 66.4 mL/min (A) (by C-G formula based on SCr of 1.4 mg/dL (H)). Liver Function Tests: No results for input(s): AST, ALT, ALKPHOS, BILITOT, PROT, ALBUMIN in the last 168 hours. No results for input(s): LIPASE, AMYLASE in the last 168 hours. No results for input(s): AMMONIA in the last 168 hours. Coagulation Profile: No results for input(s): INR, PROTIME in the last 168 hours. Cardiac Enzymes: Recent  Labs  Lab 06/18/17 0305  CKTOTAL 330   BNP (last 3 results) No results for input(s): PROBNP in the last 8760 hours. HbA1C: No results for input(s): HGBA1C in the last 72 hours. CBG: Recent Labs  Lab 06/18/17 1714 06/18/17 2055 06/18/17 2355 06/19/17 0455 06/19/17 0749  GLUCAP 102* 154* 122* 115* 99   Lipid Profile: No results for input(s): CHOL, HDL, LDLCALC, TRIG, CHOLHDL, LDLDIRECT in the last 72 hours. Thyroid Function Tests: No results for input(s): TSH, T4TOTAL, FREET4, T3FREE, THYROIDAB in the last 72 hours. Anemia Panel: No results for input(s): VITAMINB12, FOLATE, FERRITIN, TIBC, IRON, RETICCTPCT in the last 72 hours. Urine analysis:    Component Value Date/Time   COLORURINE AMBER (A) 06/08/2017 1724   APPEARANCEUR CLOUDY (A) 06/08/2017 1724   LABSPEC 1.016 06/08/2017 1724   PHURINE 5.0 06/08/2017 1724   GLUCOSEU NEGATIVE 06/08/2017 1724   HGBUR LARGE (A) 06/08/2017 1724   BILIRUBINUR NEGATIVE 06/08/2017 1724   KETONESUR NEGATIVE 06/08/2017 1724   PROTEINUR NEGATIVE 06/08/2017 1724   NITRITE NEGATIVE 06/08/2017 1724   LEUKOCYTESUR NEGATIVE 06/08/2017 1724   Sepsis Labs: @LABRCNTIP (procalcitonin:4,lacticidven:4)  ) Recent Results (from the past 240 hour(s))  Body fluid culture     Status: None   Collection Time: 06/10/17  1:13 PM  Result Value Ref Range Status   Specimen Description SYNOVIAL LEFT KNEE  Final   Special Requests Normal  Final   Gram Stain   Final    FEW WBC PRESENT, PREDOMINANTLY PMN RARE GRAM POSITIVE COCCI IN PAIRS    Culture FEW STAPHYLOCOCCUS AUREUS  Final   Report Status 06/13/2017 FINAL  Final   Organism ID, Bacteria STAPHYLOCOCCUS AUREUS  Final      Susceptibility   Staphylococcus aureus - MIC*    CIPROFLOXACIN <=0.5 SENSITIVE Sensitive     ERYTHROMYCIN <=0.25 SENSITIVE Sensitive     GENTAMICIN <=0.5 SENSITIVE Sensitive     OXACILLIN <=0.25 SENSITIVE Sensitive     TETRACYCLINE <=1 SENSITIVE Sensitive  VANCOMYCIN <=0.5  SENSITIVE Sensitive     TRIMETH/SULFA <=10 SENSITIVE Sensitive     CLINDAMYCIN <=0.25 SENSITIVE Sensitive     RIFAMPIN <=0.5 SENSITIVE Sensitive     Inducible Clindamycin NEGATIVE Sensitive     PENICILLIN Value in next row Sensitive      SENSITIVE0.06    * FEW STAPHYLOCOCCUS AUREUS  Surgical PCR screen     Status: Abnormal   Collection Time: 06/11/17  1:18 PM  Result Value Ref Range Status   MRSA, PCR NEGATIVE NEGATIVE Final   Staphylococcus aureus POSITIVE (A) NEGATIVE Final    Comment: (NOTE) The Xpert SA Assay (FDA approved for NASAL specimens in patients 44 years of age and older), is one component of a comprehensive surveillance program. It is not intended to diagnose infection nor to guide or monitor treatment.   Aerobic/Anaerobic Culture (surgical/deep wound)     Status: None   Collection Time: 06/11/17  3:21 PM  Result Value Ref Range Status   Specimen Description ABSCESS LEFT KNEE  Final   Special Requests NONE  Final   Gram Stain   Final    RARE WBC PRESENT, PREDOMINANTLY PMN RARE GRAM POSITIVE COCCI IN PAIRS    Culture FEW STAPHYLOCOCCUS AUREUS NO ANAEROBES ISOLATED   Final   Report Status 06/16/2017 FINAL  Final   Organism ID, Bacteria STAPHYLOCOCCUS AUREUS  Final      Susceptibility   Staphylococcus aureus - MIC*    CIPROFLOXACIN <=0.5 SENSITIVE Sensitive     ERYTHROMYCIN <=0.25 SENSITIVE Sensitive     GENTAMICIN <=0.5 SENSITIVE Sensitive     OXACILLIN <=0.25 SENSITIVE Sensitive     TETRACYCLINE <=1 SENSITIVE Sensitive     VANCOMYCIN <=0.5 SENSITIVE Sensitive     TRIMETH/SULFA <=10 SENSITIVE Sensitive     CLINDAMYCIN <=0.25 SENSITIVE Sensitive     RIFAMPIN <=0.5 SENSITIVE Sensitive     Inducible Clindamycin NEGATIVE Sensitive     * FEW STAPHYLOCOCCUS AUREUS  Culture, blood (routine x 2)     Status: Abnormal   Collection Time: 06/13/17 11:59 AM  Result Value Ref Range Status   Specimen Description BLOOD LEFT ANTECUBITAL  Final   Special Requests IN  PEDIATRIC BOTTLE Blood Culture adequate volume  Final   Culture  Setup Time   Final    GRAM POSITIVE COCCI IN CLUSTERS IN PEDIATRIC BOTTLE CRITICAL VALUE NOTED.  VALUE IS CONSISTENT WITH PREVIOUSLY REPORTED AND CALLED VALUE.    Culture (A)  Final    STAPHYLOCOCCUS AUREUS SUSCEPTIBILITIES PERFORMED ON PREVIOUS CULTURE WITHIN THE LAST 5 DAYS.    Report Status 06/15/2017 FINAL  Final  Culture, blood (routine x 2)     Status: Abnormal   Collection Time: 06/13/17 12:07 PM  Result Value Ref Range Status   Specimen Description BLOOD LEFT ANTECUBITAL  Final   Special Requests IN PEDIATRIC BOTTLE Blood Culture adequate volume  Final   Culture  Setup Time   Final    GRAM POSITIVE COCCI IN CLUSTERS IN PEDIATRIC BOTTLE CRITICAL RESULT CALLED TO, READ BACK BY AND VERIFIED WITH: T EGAN,PHARMD AT 1610 06/14/17 BY L BENFIELD    Culture (A)  Final    STAPHYLOCOCCUS AUREUS SUSCEPTIBILITIES PERFORMED ON PREVIOUS CULTURE WITHIN THE LAST 5 DAYS.    Report Status 06/15/2017 FINAL  Final  Culture, blood (routine x 2)     Status: None   Collection Time: 06/14/17  9:10 AM  Result Value Ref Range Status   Specimen Description BLOOD RIGHT ARM  Final   Special  Requests   Final    BOTTLES DRAWN AEROBIC ONLY Blood Culture adequate volume   Culture NO GROWTH 5 DAYS  Final   Report Status 06/19/2017 FINAL  Final  Culture, blood (routine x 2)     Status: Abnormal   Collection Time: 06/14/17  9:20 AM  Result Value Ref Range Status   Specimen Description BLOOD RIGHT HAND  Final   Special Requests IN PEDIATRIC BOTTLE Blood Culture adequate volume  Final   Culture  Setup Time   Final    GRAM POSITIVE COCCI IN CLUSTERS IN PEDIATRIC BOTTLE CRITICAL RESULT CALLED TO, READ BACK BY AND VERIFIED WITH: K HYATT,PHARMD AT 0981 06/15/17 BY L BENFIELD    Culture (A)  Final    STAPHYLOCOCCUS AUREUS SUSCEPTIBILITIES PERFORMED ON PREVIOUS CULTURE WITHIN THE LAST 5 DAYS.    Report Status 06/16/2017 FINAL  Final  Culture,  blood (routine x 2)     Status: None (Preliminary result)   Collection Time: 06/16/17  9:57 AM  Result Value Ref Range Status   Specimen Description BLOOD RIGHT HAND  Final   Special Requests   Final    BOTTLES DRAWN AEROBIC AND ANAEROBIC Blood Culture adequate volume   Culture NO GROWTH 3 DAYS  Final   Report Status PENDING  Incomplete  Culture, blood (routine x 2)     Status: None (Preliminary result)   Collection Time: 06/16/17 10:00 AM  Result Value Ref Range Status   Specimen Description BLOOD  Final   Special Requests   Final    BOTTLES DRAWN AEROBIC AND ANAEROBIC Blood Culture adequate volume   Culture NO GROWTH 3 DAYS  Final   Report Status PENDING  Incomplete         Radiology Studies: No results found.      Scheduled Meds: . enoxaparin (LOVENOX) injection  40 mg Subcutaneous Q24H  . feeding supplement  1 Container Oral TID BM  . Influenza vac split quadrivalent PF  0.5 mL Intramuscular Tomorrow-1000  . pantoprazole  40 mg Oral Q1200  . senna-docusate  1 tablet Oral BID  . thiamine  100 mg Oral Daily   Continuous Infusions: . sodium chloride    . ampicillin (OMNIPEN) IV Stopped (06/19/17 1914)     LOS: 10 days    Time spent:    Berton Mount, MD [facility] Page via [email or web address], password [email or web address] After 7PM please contact night-coverage  06/19/2017, 9:38 AM

## 2017-06-19 NOTE — Progress Notes (Signed)
Unable to obtain consent from son for PICC via phone.  Will attempt again later.  If family arrives to visit please contact IV Team

## 2017-06-19 NOTE — Progress Notes (Signed)
  Speech Language Pathology Treatment: Dysphagia  Patient Details Name: Marcus [name] MRN: [id number] DOB: 05/22/1966 Today's Date: 06/19/2017 Time: [id number] SLP Time Calculation (min) (ACUTE ONLY): 15 min  Assessment / Plan / Recommendation Clinical Impression  Patient seen for follow-up for dysphagia. Fully alert, cooperative, stating he is tired of pureed foods. SLP provided upgraded texture trials of soft solid. Pt self feeds; mastication is mildly prolonged but functional, no overt signs of aspiration, pocketing or residue noted. Requires minimal cues to slow rate, limit to single sips. Pt declined upgraded trials of regular solid (graham cracker). Will progress to dysphagia 2, thin liquids, follow up for tolerance and advancement of solids pending further skilled observation of advanced textures. Educated pt re: plan for progression and he verbalizes agreement. Spoke with RN; pt would benefit from SLP cognitive-linguistic evaluation due to acute findings on MRI and mother's concern for cognitive changes. MD paged.    HPI HPI: This is a 52 yo man with no notable past medical history, here with altered mental status. Hsitory obtained from EDP. No family at bedside. Pt not following commands but winces to pain.  Per chart review, pt was last seen normal by his girlfriend at 12:00, and later she found him on the ground, on his right side, unresponsive. He was tachycardic in ED. He has history fo opiate use- UDS was positive for cocaine and opiates and benzo.  MRI is showing numerous small foci of acute/early sub-acute infarct in right greater then left posterior hemipheres, basal ganglia, brainstem and cerebellum.  Chest xray is showing no active disease.        SLP Plan  Continue with current plan of care       Recommendations  Diet recommendations: Dysphagia 2 (fine chop);Thin liquid Liquids provided via: Cup;Straw Medication Administration: Whole meds with puree Supervision: Staff to  assist with self feeding;Full supervision/cueing for compensatory strategies Compensations: Minimize environmental distractions;Slow rate;Small sips/bites;Lingual sweep for clearance of pocketing Postural Changes and/or Swallow Maneuvers: Seated upright 90 degrees                Oral Care Recommendations: Oral care BID Follow up Recommendations: Skilled Nursing facility SLP Visit Diagnosis: Dysphagia, oropharyngeal phase (R13.12) Plan: Continue with current plan of care       GO               Rondel BatonMary Beth Tionne Dayhoff, MS, CCC-SLP Speech-Language Pathologist [phone]  Marcus [name] 06/19/2017, 3:13 PM

## 2017-06-19 NOTE — Progress Notes (Signed)
Attempted several times to obtain consent by phone unsuccessful for PICC placement.  Floor RN states family has not been in to visit. Will continue to follow up.

## 2017-06-19 NOTE — Progress Notes (Signed)
CSW spoke with patient's mother to let her know that we have found a facility for the patient in [place] and that he may be able to transfer there tomorrow. The patient's mother expressed concern over the facility being so far away. CSW explained that is the only facility able to accept the patient without insurance and needing an IV medication. Patient's mother reports that she feels he is not ready to be discharged. She states that she would rather die than be put in a nursing home and wants the patient home with family. She said she would call CSW tomorrow after she discusses with the rest of her family. CSW explained that patient may not be safe to return home.   Osborne Cascoadia Delphine Sizemore LCSW [phone]

## 2017-06-19 NOTE — Progress Notes (Signed)
Orthopedic Tech Progress Note Patient Details:  Kern ReapBryon T Shelley 03/10/1966 [id number]  Ortho Devices Ortho Device/Splint Location: [facility] Ortho Device/Splint Interventions: Application   Post Interventions Patient Tolerated: Poor Instructions Provided: Care of device   Saul FordyceJennifer C Mya Suell 06/19/2017, 3:16 PM

## 2017-06-19 NOTE — Progress Notes (Addendum)
PT Cancellation Note  Patient Details Name: Marcus [name] MRN: [id number] DOB: 07/05/1965   Cancelled Treatment:    Reason Eval/Treat Not Completed: Other (comment)(Pt fatigued from session with OT will defer tx today and follow up per POC as time permits.  )   Adjusted R PRAFO and donned L PRAFO with education to staff to use the kick stand facing medially for optimal positioning.     Bain Whichard Artis DelayJ Prince Couey 06/19/2017, 4:22 PM  Joycelyn RuaAimee Lakeithia Rasor, PTA pager [phone]

## 2017-06-19 NOTE — Progress Notes (Signed)
Occupational Therapy Treatment Patient Details Name: Marcus ReapBryon T Tadros MRN: [id number] DOB: 10/08/1965 Today's Date: 06/19/2017    History of present illness Pt is a 52 y/o male with history of polysubstance abuse and hep C was found down by his significant other, subsequently brought to the emergency room, he was obtunded and found to have severe metabolic encephalopathy, infective endocarditis, MSSA bacteremia, septic emboli/embolic strokes and left knee septic arthritis. Pt underwent left knee I & D on 1/9.   OT comments  Focus of session on functional use of RUE and neuro rehab for trunk and LUE using strengthening nad PNF techniques. Pt demonstrating isolated movement patterns LUE.  Recieived verbal order to obtain PRAFO splint to be alternated q 2 hrs to improve positioning B ankles. Continue to recommend SNF for rehab.   Follow Up Recommendations  SNF;Supervision/Assistance - 24 hour    Equipment Recommendations  3 in 1 bedside commode;Wheelchair (measurements OT);Wheelchair cushion (measurements OT);Hospital bed;Tub/shower bench    Recommendations for Other Services      Precautions / Restrictions Precautions Precautions: Fall Precaution Comments: At risk for developing contractures       Mobility Bed Mobility Overal bed mobility: Needs Assistance                Transfers                      Balance                                           ADL either performed or assessed with clinical judgement   ADL Overall ADL's : Needs assistance/impaired Eating/Feeding: Moderate assistance Eating/Feeding Details (indicate cue type and reason): will assess use of AE Grooming: Maximal assistance Grooming Details (indicate cue type and reason): lmited shoulder flexion RUE                                     Vision       Perception     Praxis      Cognition Arousal/Alertness: Awake/alert Behavior During Therapy: Flat  affect Overall Cognitive Status: Impaired/Different from baseline Area of Impairment: Attention;Memory;Safety/judgement;Awareness;Problem solving                   Current Attention Level: Selective Memory: Decreased short-term memory Following Commands: Follows one step commands with increased time Safety/Judgement: Decreased awareness of safety;Decreased awareness of deficits Awareness: Emergent Problem Solving: Slow processing;Decreased initiation;Difficulty sequencing;Requires verbal cues;Requires tactile cues          Exercises Exercises: Other exercises;Shoulder;General Upper Extremity General Exercises - Upper Extremity Shoulder Flexion: AROM;AAROM;Both;20 reps;Supine Shoulder Horizontal ABduction: AROM;AAROM;Both;15 reps;Supine Shoulder Horizontal ADduction: AROM;AAROM;15 reps;Supine;Both Elbow Flexion: AROM;AAROM;Both;20 reps;Supine Elbow Extension: AROM;AAROM;Both;20 reps;Supine Digit Composite Flexion: AROM;AAROM;Both;20 reps;Supine Composite Extension: AAROM;Left;15 reps;Supine General Exercises - Lower Extremity Heel Slides: AAROM;Right;15 reps Other Exercises Other Exercises: rythmic stabilization ex RLE x 10 Other Exercises: Trunk/core strengthening by reaching across midline to engage core x 10  Retrograde massage L hand   Shoulder Instructions       General Comments      Pertinent Vitals/ Pain       Pain Assessment: Faces Faces Pain Scale: Hurts whole lot Pain Location: B shoulders; L knee Pain Descriptors / Indicators: Moaning;Grimacing;Guarding Pain Intervention(s): Limited activity within patient's tolerance;RN gave  pain meds during session  Home Living                                          Prior Functioning/Environment              Frequency  Min 2X/week        Progress Toward Goals  OT Goals(current goals can now be found in the care plan section)  Progress towards OT goals: Progressing toward  goals  Acute Rehab OT Goals Patient Stated Goal: to get stronger OT Goal Formulation: With patient Time For Goal Achievement: 06/30/17 Potential to Achieve Goals: Fair ADL Goals Pt Will Perform Eating: with supervision;with set-up;with adaptive utensils Pt Will Perform Grooming: with min assist;sitting Pt Will Perform Upper Body Bathing: with min assist;sitting Pt/caregiver will Perform Home Exercise Program: With minimal assist;Increased ROM;Increased strength;Both right and left upper extremity Additional ADL Goal #1: Pt will sit EOB x 5 min with min A in preparation for ADL tasks Additional ADL Goal #2: Pt will tolerate PRAFO for B feet, alternating q 2 hrs to improve functional positioning of B feet  Plan Discharge plan remains appropriate    Co-evaluation                 AM-PAC PT "6 Clicks" Daily Activity     Outcome Measure   Help from another person eating meals?: A Lot Help from another person taking care of personal grooming?: A Lot Help from another person toileting, which includes using toliet, bedpan, or urinal?: Total Help from another person bathing (including washing, rinsing, drying)?: A Lot Help from another person to put on and taking off regular upper body clothing?: A Lot Help from another person to put on and taking off regular lower body clothing?: Total 6 Click Score: 10    End of Session    OT Visit Diagnosis: Other abnormalities of gait and mobility (R26.89);Low vision, both eyes (H54.2);Muscle weakness (generalized) (M62.81);Other symptoms and signs involving cognitive function;Hemiplegia and hemiparesis;Pain Hemiplegia - Right/Left: Left Hemiplegia - dominant/non-dominant: Non-Dominant Hemiplegia - caused by: Cerebral infarction Pain - Right/Left: Left Pain - part of body: Knee   Activity Tolerance Patient tolerated treatment well   Patient Left in bed;with call bell/phone within reach;with bed alarm set   Nurse Communication Mobility  status;Other (comment)(need for B PRAFO )        Time: 1345-1410 OT Time Calculation (min): 25 min  Charges: OT General Charges $OT Visit: 1 Visit OT Treatments $Self Care/Home Management : 8-22 mins $Neuromuscular Re-education: 8-22 mins  [facility], OT/L  [phone] 06/19/2017   Lillybeth Tal,HILLARY 06/19/2017, 2:30 PM

## 2017-06-20 LAB — GLUCOSE, CAPILLARY
GLUCOSE-CAPILLARY: 115 mg/dL — AB (ref 65–99)
GLUCOSE-CAPILLARY: 107 mg/dL — AB (ref 65–99)
GLUCOSE-CAPILLARY: 133 mg/dL — AB (ref 65–99)
Glucose-Capillary: 117 mg/dL — ABNORMAL HIGH (ref 65–99)
Glucose-Capillary: 151 mg/dL — ABNORMAL HIGH (ref 65–99)
Glucose-Capillary: 146 mg/dL — ABNORMAL HIGH (ref 65–99)

## 2017-06-20 LAB — RENAL FUNCTION PANEL
Albumin: 1.2 g/dL — ABNORMAL LOW (ref 3.5–5.0)
Anion gap: 10 (ref 5–15)
BUN: 19 mg/dL (ref 6–20)
CO2: 22 mmol/L (ref 22–32)
Calcium: 7.9 mg/dL — ABNORMAL LOW (ref 8.9–10.3)
Chloride: 101 mmol/L (ref 101–111)
Creatinine, Ser: 1.24 mg/dL (ref 0.61–1.24)
GFR calc Af Amer: >60 mL/min (ref >60)
GFR calc non Af Amer: >60 mL/min (ref >60)
Glucose, Bld: 113 mg/dL — ABNORMAL HIGH (ref 65–99)
Phosphorus: 3.1 mg/dL (ref 2.5–4.6)
Potassium: 3.8 mmol/L (ref 3.5–5.1)
Sodium: 133 mmol/L — ABNORMAL LOW (ref 135–145)

## 2017-06-20 MED ORDER — SODIUM CHLORIDE 0.9% FLUSH: 10.0000 mL | INTRAVENOUS | Status: DC | PRN

## 2017-06-20 NOTE — Progress Notes (Signed)
PROGRESS NOTE    Marcus [name]  ZOX:[id number] DOB: 05/19/1966 DOA: 06/08/2017 PCP: Patient, No Pcp Per  Brief Narrative: [facility] to [facility] 1/10 Patient is a 52 year old Caucasian male with past medical history significant for polysubstance abuse and hep C. Patient was found down by his significant other and, subsequently, brought to the emergency room. On presentation, patient was obtunded, and found to have severe metabolic encephalopathy, infective endocarditis, MSSA bacteremia, septic emboli/embolic strokes and left knee septic arthritis. Patient is being followed by infectious disease and orthopedic teams. Patient is currently on IV Ampicillin. Patient  underwent left knee I & D on 1/9. Patient needs of IV Ampicillin from first negative cultures. Blood cultures done on 06/16/17 have not grown any organisms till date.  06/20/2017 - Patient seen alongside patient's Mother, mother of the patient's child and the patient's Nurse. I updated all present. All questions asked were answered. Explained to all present that patient was medically stable for discharge to SNF once piccline is placed. Patient will need to complete course of antibiotics. Will await Piccline team.   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 -History of polysubstance abuse however no clear history obtained to suggest IV drug use, could have started from Septic Knee -clinically improving, sepsis physiology resolved -Repeat blood cultures from 1/9 and 1/11 remain positive, blood cultures from 1/12, positive 1 in /2 -repeat Cx 1/14 negative x1day, if stays negative then place PICC line and DC to SNF for 6weeks of Abx until 2/25 -2D ECHO with mitral valve vegetation -ID following, plan for 6 weeks of IV Nafcillin or Ampicillin from first negative cultures -low grade temp last pm -overall improving clinically, debilitated from embolic  strokes -PT/OT eval completed, SNF recommended -CSW following for rehabilitation options, Nafcillin changed to Ampicillin due to financial concerns - Cultures done on 06/16/17 have not grown any organisms - For piccline placement. - Plan is for SNF placement.  Left Knee Septic Arthritis -Culture w/ MSSA -s/p incision and debridement on 1/9 by Dr. Roda ShuttersXu -stable, improving on Abx  Multiple embolic strokes -Secondary to infective endocarditis -With resultant left-sided weakness, left field cut, some dysarthria and dysphagia -Treatment for this is antibiotics for infective endocarditis, continue Ampicillin  -continue physical and occupational therapy, SNF recommended -speech therapy evaluation completed, dysphagia 1 diet recommended at this time -Appreciate neurology input [facility]  Fluid overload/iatrogenic and third spacing from sepsis -ECHo with preserved EF -Improving, diuresed for 2days  -appears euvolemic. Lasix is on hold.  Abnormal electrolytes (Hypokalemia and Hyponatremia) - Renal panel stat - Telemetry monitoring due to severe hypokalemia - Likely related to diuretics therapy - Will check urine sodium and magnesium level.  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 - Resolved  DVT prophylaxis:  lovenox Code Status:  full code  Family Communication: dtr at bedside  Disposition Plan: SNF    Consultants:    ID  PCCM  Ortho   neurology  Procedures: PROCEDURE: Arthrotomy of left knee and irrigation and debridement for septic arthritis 1/9   2-D echocardiogram   Antimicrobials:  Antibiotics Given (last 72 hours)    Date/Time Action Medication Dose Rate   06/17/17 2125 New Bag/Given   nafcillin 2 g in dextrose 5 % 100 mL IVPB 2 g 200 mL/hr   06/18/17 0231 New Bag/Given   nafcillin 2 g in dextrose 5 % 100 mL IVPB 2 g 200 mL/hr   06/18/17 0527 New Bag/Given   nafcillin 2 g in dextrose 5 % 100 mL IVPB 2 g 200 mL/hr   06/18/17 1303 New Bag/Given   ampicillin (OMNIPEN) 2 g in sodium chloride 0.9 % 50 mL IVPB 2 g 150 mL/hr   06/18/17 1659 New Bag/Given   ampicillin (OMNIPEN) 2 g in sodium chloride 0.9 % 50 mL IVPB 2 g 150 mL/hr   06/18/17 2055 New Bag/Given   ampicillin (OMNIPEN) 2 g in sodium chloride 0.9 % 50 mL IVPB 2 g 150 mL/hr   06/18/17 2333 New Bag/Given   ampicillin (OMNIPEN) 2 g in sodium chloride 0.9 % 50 mL IVPB 2 g 150 mL/hr   06/19/17 0436 New Bag/Given   ampicillin (OMNIPEN) 2 g in sodium chloride 0.9 % 50 mL IVPB 2 g 150 mL/hr   06/19/17 3244 New Bag/Given   ampicillin (OMNIPEN) 2 g in sodium chloride 0.9 % 50 mL IVPB 2 g 150 mL/hr   06/19/17 1352 New Bag/Given   ampicillin (OMNIPEN) 2 g in sodium chloride 0.9 % 50 mL IVPB 2 g 150 mL/hr   06/19/17 1750 New Bag/Given   ampicillin (OMNIPEN) 2 g in sodium chloride 0.9 % 50 mL IVPB 2 g 150 mL/hr   06/19/17 2037 New Bag/Given   ampicillin (OMNIPEN) 2 g in sodium chloride 0.9 % 50 mL IVPB 2 g 150 mL/hr   06/20/17 0112 New Bag/Given   ampicillin (OMNIPEN) 2 g in sodium chloride 0.9 % 50 mL IVPB 2 g 150 mL/hr   06/20/17 0421 New Bag/Given   ampicillin (OMNIPEN) 2 g in sodium chloride 0.9 % 50 mL IVPB 2 g 150 mL/hr   06/20/17 0828 New Bag/Given   ampicillin (OMNIPEN) 2 g in sodium chloride 0.9 % 50 mL IVPB 2 g 150 mL/hr   06/20/17 1208 New Bag/Given   ampicillin (OMNIPEN) 2 g in sodium chloride 0.9 % 50 mL IVPB 2 g 150  mL/hr   06/20/17 1521 New Bag/Given   ampicillin (OMNIPEN) 2 g in sodium chloride 0.9 % 50 mL IVPB 2 g 150 mL/hr      Subjective: - No new complaints.  Objective: Vitals:   06/19/17 2130 06/20/17 0459 06/20/17 0635 06/20/17 1514  BP: (!) 86/42 (!) 115/58  127/64  Pulse: (!) 110 (!) 107  (!) 107  Resp: (!) 24 20  20   Temp: 98.4 F (36.9 C) 98.6 F (37 C)  99.7 F (37.6 C)  TempSrc: Oral Oral    SpO2: 98% 97%  99%  Weight:   76.7 kg (169 lb 1.5 oz)   Height:        Intake/Output Summary (Last 24 hours) at 06/20/2017 1833 Last data filed at 06/20/2017 0102 Gross per 24 hour  Intake 260 ml  Output -  Net 260 ml   Filed Weights   06/17/17 0503 06/18/17 0501 06/20/17 0635  Weight: 77.8 kg (171 lb 8.3 oz) 75.2 kg (165 lb 12.6 oz) 76.7 kg (169 lb 1.5 oz)  Examination: Gen: Awake, Alert, Oriented X 3, weak looking. Mild facial asymmetry. HEENT: no JVD, poor dental hygiene Lungs: decreased at bases CVS: RRR,No Gallops,Rubs or new Murmurs Abd: soft, Non tender, non distended, BS present Extremities: Left knee with dressing, trace edema Skin: no new rashes  neuro left hemiparesis. Facial asymmetry.   Data Reviewed:   CBC: Recent Labs  Lab 06/15/17 0232 06/16/17 0235 06/18/17 0305  WBC 13.2* 16.0* 10.6*  HGB 8.9* 9.4* 8.6*  HCT 27.1* 28.5* 26.2*  MCV 84.2 84.6 85.6  PLT 288 319 316   Basic Metabolic Panel: Recent Labs  Lab 06/16/17 0235 06/17/17 0232 06/18/17 0305 06/19/17 0932 06/20/17 0234  NA 133* 132* 130* 134* 133*  K 3.3* 3.3* 2.9* 3.2* 3.8  CL 100* 98* 98* 100* 101  CO2 23 25 22 23 22   GLUCOSE 122* 123* 138* 110* 113*  BUN 16 18 19 16 19   CREATININE 1.37* 1.38* 1.40* 1.22 1.24  CALCIUM 7.5* 7.2* 7.8* 7.9* 7.9*  MG  --   --   --  1.8  --   PHOS  --   --   --  3.7 3.1   GFR: Estimated Creatinine Clearance: 75.1 mL/min (by C-G formula based on SCr of 1.24 mg/dL). Liver Function Tests: Recent Labs  Lab 06/19/17 0932 06/20/17 0234  ALBUMIN  1.2* 1.2*   No results for input(s): LIPASE, AMYLASE in the last 168 hours. No results for input(s): AMMONIA in the last 168 hours. Coagulation Profile: No results for input(s): INR, PROTIME in the last 168 hours. Cardiac Enzymes: Recent Labs  Lab 06/18/17 0305  CKTOTAL 330   BNP (last 3 results) No results for input(s): PROBNP in the last 8760 hours. HbA1C: No results for input(s): HGBA1C in the last 72 hours. CBG: Recent Labs  Lab 06/20/17 0034 06/20/17 0406 06/20/17 0749 06/20/17 1150 06/20/17 1618  GLUCAP 151* 115* 117* 107* 133*   Lipid Profile: No results for input(s): CHOL, HDL, LDLCALC, TRIG, CHOLHDL, LDLDIRECT in the last 72 hours. Thyroid Function Tests: No results for input(s): TSH, T4TOTAL, FREET4, T3FREE, THYROIDAB in the last 72 hours. Anemia Panel: No results for input(s): VITAMINB12, FOLATE, FERRITIN, TIBC, IRON, RETICCTPCT in the last 72 hours. Urine analysis:    Component Value Date/Time   COLORURINE AMBER (A) 06/08/2017 1724   APPEARANCEUR CLOUDY (A) 06/08/2017 1724   LABSPEC 1.016 06/08/2017 1724   PHURINE 5.0 06/08/2017 1724   GLUCOSEU NEGATIVE 06/08/2017 1724   HGBUR LARGE (A) 06/08/2017 1724   BILIRUBINUR NEGATIVE 06/08/2017 1724   KETONESUR NEGATIVE 06/08/2017 1724   PROTEINUR NEGATIVE 06/08/2017 1724   NITRITE NEGATIVE 06/08/2017 1724   LEUKOCYTESUR NEGATIVE 06/08/2017 1724   Sepsis Labs: @LABRCNTIP (procalcitonin:4,lacticidven:4)  ) Recent Results (from the past 240 hour(s))  Surgical PCR screen     Status: Abnormal   Collection Time: 06/11/17  1:18 PM  Result Value Ref Range Status   MRSA, PCR NEGATIVE NEGATIVE Final   Staphylococcus aureus POSITIVE (A) NEGATIVE Final    Comment: (NOTE) The Xpert SA Assay (FDA approved for NASAL specimens in patients 34 years of age and older), is one component of a comprehensive surveillance program. It is not intended to diagnose infection nor to guide or monitor treatment.   Aerobic/Anaerobic  Culture (surgical/deep wound)     Status: None   Collection Time: 06/11/17  3:21 PM  Result Value Ref Range Status   Specimen Description ABSCESS LEFT KNEE  Final   Special Requests NONE  Final   Gram Stain  Final    RARE WBC PRESENT, PREDOMINANTLY PMN RARE GRAM POSITIVE COCCI IN PAIRS    Culture FEW STAPHYLOCOCCUS AUREUS NO ANAEROBES ISOLATED   Final   Report Status 06/16/2017 FINAL  Final   Organism ID, Bacteria STAPHYLOCOCCUS AUREUS  Final      Susceptibility   Staphylococcus aureus - MIC*    CIPROFLOXACIN <=0.5 SENSITIVE Sensitive     ERYTHROMYCIN <=0.25 SENSITIVE Sensitive     GENTAMICIN <=0.5 SENSITIVE Sensitive     OXACILLIN <=0.25 SENSITIVE Sensitive     TETRACYCLINE <=1 SENSITIVE Sensitive     VANCOMYCIN <=0.5 SENSITIVE Sensitive     TRIMETH/SULFA <=10 SENSITIVE Sensitive     CLINDAMYCIN <=0.25 SENSITIVE Sensitive     RIFAMPIN <=0.5 SENSITIVE Sensitive     Inducible Clindamycin NEGATIVE Sensitive     * FEW STAPHYLOCOCCUS AUREUS  Culture, blood (routine x 2)     Status: Abnormal   Collection Time: 06/13/17 11:59 AM  Result Value Ref Range Status   Specimen Description BLOOD LEFT ANTECUBITAL  Final   Special Requests IN PEDIATRIC BOTTLE Blood Culture adequate volume  Final   Culture  Setup Time   Final    GRAM POSITIVE COCCI IN CLUSTERS IN PEDIATRIC BOTTLE CRITICAL VALUE NOTED.  VALUE IS CONSISTENT WITH PREVIOUSLY REPORTED AND CALLED VALUE.    Culture (A)  Final    STAPHYLOCOCCUS AUREUS SUSCEPTIBILITIES PERFORMED ON PREVIOUS CULTURE WITHIN THE LAST 5 DAYS.    Report Status 06/15/2017 FINAL  Final  Culture, blood (routine x 2)     Status: Abnormal   Collection Time: 06/13/17 12:07 PM  Result Value Ref Range Status   Specimen Description BLOOD LEFT ANTECUBITAL  Final   Special Requests IN PEDIATRIC BOTTLE Blood Culture adequate volume  Final   Culture  Setup Time   Final    GRAM POSITIVE COCCI IN CLUSTERS IN PEDIATRIC BOTTLE CRITICAL RESULT CALLED TO, READ BACK  BY AND VERIFIED WITH: T EGAN,PHARMD AT 1610 06/14/17 BY L BENFIELD    Culture (A)  Final    STAPHYLOCOCCUS AUREUS SUSCEPTIBILITIES PERFORMED ON PREVIOUS CULTURE WITHIN THE LAST 5 DAYS.    Report Status 06/15/2017 FINAL  Final  Culture, blood (routine x 2)     Status: None   Collection Time: 06/14/17  9:10 AM  Result Value Ref Range Status   Specimen Description BLOOD RIGHT ARM  Final   Special Requests   Final    BOTTLES DRAWN AEROBIC ONLY Blood Culture adequate volume   Culture NO GROWTH 5 DAYS  Final   Report Status 06/19/2017 FINAL  Final  Culture, blood (routine x 2)     Status: Abnormal   Collection Time: 06/14/17  9:20 AM  Result Value Ref Range Status   Specimen Description BLOOD RIGHT HAND  Final   Special Requests IN PEDIATRIC BOTTLE Blood Culture adequate volume  Final   Culture  Setup Time   Final    GRAM POSITIVE COCCI IN CLUSTERS IN PEDIATRIC BOTTLE CRITICAL RESULT CALLED TO, READ BACK BY AND VERIFIED WITH: K HYATT,PHARMD AT 0723 06/15/17 BY L BENFIELD    Culture (A)  Final    STAPHYLOCOCCUS AUREUS SUSCEPTIBILITIES PERFORMED ON PREVIOUS CULTURE WITHIN THE LAST 5 DAYS.    Report Status 06/16/2017 FINAL  Final  Culture, blood (routine x 2)     Status: None (Preliminary result)   Collection Time: 06/16/17  9:57 AM  Result Value Ref Range Status   Specimen Description BLOOD RIGHT HAND  Final   Special Requests  Final    BOTTLES DRAWN AEROBIC AND ANAEROBIC Blood Culture adequate volume   Culture NO GROWTH 4 DAYS  Final   Report Status PENDING  Incomplete  Culture, blood (routine x 2)     Status: None (Preliminary result)   Collection Time: 06/16/17 10:00 AM  Result Value Ref Range Status   Specimen Description BLOOD RIGHT HAND  Final   Special Requests   Final    BOTTLES DRAWN AEROBIC AND ANAEROBIC Blood Culture adequate volume   Culture NO GROWTH 4 DAYS  Final   Report Status PENDING  Incomplete         Radiology Studies: No results  found.      Scheduled Meds: . enoxaparin (LOVENOX) injection  40 mg Subcutaneous Q24H  . feeding supplement  1 Container Oral TID BM  . Influenza vac split quadrivalent PF  0.5 mL Intramuscular Tomorrow-1000  . pantoprazole  40 mg Oral Q1200  . senna-docusate  1 tablet Oral BID  . thiamine  100 mg Oral Daily   Continuous Infusions: . sodium chloride 250 mL (06/20/17 1610)  . ampicillin (OMNIPEN) IV Stopped (06/20/17 1620)     LOS: 11 days    Time spent:    Berton Mount, MD [facility] Page via [email or web address], password [email or web address] After 7PM please contact night-coverage  06/20/2017, 6:33 PM

## 2017-06-20 NOTE — Progress Notes (Signed)
Patient's family waited in the room to speak with MD but are unable to wait any longer. Patient's mother reports that she needs to speak with MD before [facility] able [facility] [phone]([place] [phone]).  Osborne Cascoadia Mazell Aylesworth LCSW [phone]

## 2017-06-20 NOTE — Progress Notes (Signed)
PT BID Note    06/20/17 1646  PT Visit Information  Last PT Received On 06/20/17  Assistance Needed +2  History of Present Illness Pt is a 52 y/o male with history of polysubstance abuse and hep C was found down by his significant other, subsequently brought to the emergency room, he was obtunded and found to have severe metabolic encephalopathy, infective endocarditis, MSSA bacteremia, septic emboli/embolic strokes and left knee septic arthritis. Pt underwent left knee I & D on 1/9.  Precautions  Precautions Fall  Other Brace/Splint Bilat PRAFO's  Restrictions  LLE Weight Bearing WBAT  Pain Assessment  Pain Assessment Faces  Faces Pain Scale 8  Pain Location L knee  Pain Descriptors / Indicators Moaning;Grimacing;Guarding  Pain Intervention(s) Monitored during session;Repositioned  Cognition  Arousal/Alertness Awake/alert  Behavior During Therapy Flat affect  Overall Cognitive Status Impaired/Different from baseline  Area of Impairment Safety/judgement;Attention;Problem solving;Following commands  Current Attention Level Selective  Following Commands Follows one step commands with increased time  Safety/Judgement Decreased awareness of deficits;Decreased awareness of safety  Problem Solving Slow processing;Decreased initiation;Difficulty sequencing;Requires verbal cues;Requires tactile cues  Bed Mobility  Overal bed mobility Needs Assistance  Bed Mobility Sit to Supine  Sit to supine +2 for physical assistance;Max assist  General bed mobility comments assist for legs onto bed and for trunk to supine; attempted to have pt help scoot over in bed using L foot on bed  Transfers  Overall transfer level Needs assistance  Equipment used None  Transfers Lateral/Scoot Transfers  Lateral/Scoot Transfers +2 physical assistance;Total assist  General transfer comment bed pad used again to assist pt back into bed, he attempted to assist some pushing with UE's  Balance  Overall balance  assessment Needs assistance  Sitting-balance support Bilateral upper extremity supported  Sitting balance-Leahy Scale Poor  Sitting balance - Comments assist for balance as scooting to bed, cues for hand placement  PT - End of Session  Activity Tolerance Patient limited by pain  Patient left in bed;with call bell/phone within reach;with family/visitor present  PT - Assessment/Plan  PT Plan Current plan remains appropriate  PT Visit Diagnosis Other abnormalities of gait and mobility (R26.89);Pain;Muscle weakness (generalized) (M62.81)  Pain - Right/Left Left  Pain - part of body Knee  PT Frequency (ACUTE ONLY) Min 2X/week  Follow Up Recommendations SNF;Supervision/Assistance - 24 hour  PT equipment None recommended by PT  AM-PAC PT "6 Clicks" Daily Activity Outcome Measure  Difficulty turning over in bed (including adjusting bedclothes, sheets and blankets)? 1  Difficulty moving from lying on back to sitting on the side of the bed?  1  Difficulty sitting down on and standing up from a chair with arms (e.g., wheelchair, bedside commode, etc,.)? 1  Help needed moving to and from a bed to chair (including a wheelchair)? 1  Help needed walking in hospital room? 1  Help needed climbing 3-5 steps with a railing?  1  6 Click Score 6  Mobility G Code  CN  PT Goal Progression  Progress towards PT goals Progressing toward goals  PT Time Calculation  PT Start Time (ACUTE ONLY) 1512  PT Stop Time (ACUTE ONLY) 1530  PT Time Calculation (min) (ACUTE ONLY) 18 min  PT General Charges  $$ ACUTE PT VISIT 1 Visit  PT Treatments  $Therapeutic Activity 8-22 mins  Second session today to assist pt to use scooting technique for back to bed and to assist nursing.  Patient able to assist slightly more than earlier today with  better understanding of technique.  Continues to need skilled PT in the acute setting and follow up SNF level rehab at d/c.  Marcus [name], [place] [phone] 06/20/2017

## 2017-06-20 NOTE — Progress Notes (Signed)
Physical Therapy Treatment Patient Details Name: Marcus [name] MRN: [id number] DOB: [birth year] Today's Date: 06/20/2017    History of Present Illness Pt is a 52 y/o male with history of polysubstance abuse and hep C was found down by his significant other, subsequently brought to the emergency room, he was obtunded and found to have severe metabolic encephalopathy, infective endocarditis, MSSA bacteremia, septic emboli/embolic strokes and left knee septic arthritis. Pt underwent left knee I & D on 1/9.    PT Comments    Patient progressing to OOB via lateral scoot to drop arm chair.  Very guarded and painful initially with any movement of L knee, but able to tolerate more than anticipated and willing to try to get OOB.  Difficult to tell if has active movement in L LE due to limitations of pain, noted pt moving L UE for positioning to have hand on bed for balance and for lateral scoot.  Feel pt appropriate for SNF level rehab.   Follow Up Recommendations  SNF;Supervision/Assistance - 24 hour     Equipment Recommendations  None recommended by PT    Recommendations for Other Services       Precautions / Restrictions Precautions Precautions: Fall Required Braces or Orthoses: Other Brace/Splint Other Brace/Splint: Bilat PRAFO's Restrictions LLE Weight Bearing: Weight bearing as tolerated    Mobility  Bed Mobility Overal bed mobility: Needs Assistance Bed Mobility: Supine to Sit     Supine to sit: +2 for physical assistance;Total assist;HOB elevated     General bed mobility comments: assist for legs off bed slowly due to L knee pain with any movement, and assist to lift trunk upright  Transfers Overall transfer level: Needs assistance Equipment used: None Transfers: Lateral/Scoot Transfers          Lateral/Scoot Transfers: +2 physical assistance;Total assist General transfer comment: used bed pad to scoot incrementally from bed to drop arm recliner, assist for L knee  repositioning along the way  Ambulation/Gait                 Stairs            Wheelchair Mobility    Modified Rankin (Stroke Patients Only)       Balance Overall balance assessment: Needs assistance Sitting-balance support: Feet supported;Bilateral upper extremity supported;Single extremity supported Sitting balance-Leahy Scale: Poor Sitting balance - Comments: initially mod to max A for sitting balance pt holding R railing and leaning to R, then able to get hand flat on bed and gradually take away support, pt performing small circles with trunk in sitting with UE support; sat EOB about 10 minutes                                    Cognition Arousal/Alertness: Awake/alert Behavior During Therapy: Flat affect Overall Cognitive Status: Impaired/Different from baseline Area of Impairment: Safety/judgement;Attention;Problem solving;Following commands                   Current Attention Level: Selective   Following Commands: Follows one step commands with increased time Safety/Judgement: Decreased awareness of deficits;Decreased awareness of safety   Problem Solving: Slow processing;Decreased initiation;Difficulty sequencing;Requires verbal cues;Requires tactile cues        Exercises      General Comments General comments (skin integrity, edema, etc.): continues to c/o of L knee pain with any movement and noted to bed edematous; mother in room and happy pt  able to participate, but initially sobbing due to pt yelling in pain      Pertinent Vitals/Pain Faces Pain Scale: Hurts whole lot Pain Location: L knee Pain Descriptors / Indicators: Moaning;Grimacing;Guarding Pain Intervention(s): Monitored during session;Repositioned    Home Living                      Prior Function            PT Goals (current goals can now be found in the care plan section) Progress towards PT goals: Progressing toward goals    Frequency     Min 2X/week      PT Plan Current plan remains appropriate    Co-evaluation              AM-PAC PT "6 Clicks" Daily Activity  Outcome Measure  Difficulty turning over in bed (including adjusting bedclothes, sheets and blankets)?: Unable Difficulty moving from lying on back to sitting on the side of the bed? : Unable Difficulty sitting down on and standing up from a chair with arms (e.g., wheelchair, bedside commode, etc,.)?: Unable Help needed moving to and from a bed to chair (including a wheelchair)?: Total Help needed walking in hospital room?: Total Help needed climbing 3-5 steps with a railing? : Total 6 Click Score: 6    End of Session Equipment Utilized During Treatment: Gait belt Activity Tolerance: Patient limited by pain Patient left: with call bell/phone within reach;in chair;with family/visitor present   PT Visit Diagnosis: Other abnormalities of gait and mobility (R26.89);Pain;Muscle weakness (generalized) (M62.81) Pain - Right/Left: Left Pain - part of body: Knee     Time: 1400-1435 PT Time Calculation (min) (ACUTE ONLY): 35 min  Charges:  $Therapeutic Activity: 23-37 mins                    G CodesSheran [name]:       Marcus [name], [place] [phone] 06/20/2017    Marcus [name] 06/20/2017, 4:20 PM

## 2017-06-20 NOTE — Progress Notes (Signed)
  Speech Language Pathology Treatment: Dysphagia  Patient Details Name: Marcus [name] MRN: [id number] DOB: 03/01/1966 Today's Date: 06/20/2017 Time: [id number] SLP Time Calculation (min) (ACUTE ONLY): 20 min  Assessment / Plan / Recommendation Clinical Impression  Pt continues to demonstrate progress with swallow function. No s/s of aspiration or changes in vitals observed during PO trials. Pt continues to report limited appetite, but accepted all PO trials. Pt had prolonged mastication for solid trials, likely a result of missing dentition. Pt required min verbal cues for swallow precautions, small/slow bites/sips and sitting upright for all PO intake. Recommend diet advancement to Dysphagia 3, mech soft, solids and thin liquids. RN reports pt having difficulty with whole medications; therefore, recommend medications administered crushed in applesauce. SLP will continue to follow to ensure diet is appropriate and potentially advance solids.     HPI HPI: This is a 52 yo man with no notable past medical history, here with altered mental status. Hsitory obtained from EDP. No family at bedside. Pt not following commands but winces to pain.  Per chart review, pt was last seen normal by his girlfriend at 12:00, and later she found him on the ground, on his right side, unresponsive. He was tachycardic in ED. He has history fo opiate use- UDS was positive for cocaine and opiates and benzo.  MRI is showing numerous small foci of acute/early sub-acute infarct in right greater then left posterior hemipheres, basal ganglia, brainstem and cerebellum.  Chest xray is showing no active disease.        SLP Plan  Continue with current plan of care       Recommendations  Diet recommendations: Dysphagia 3 (mechanical soft);Thin liquid Liquids provided via: Straw Medication Administration: Crushed with puree Supervision: Staff to assist with self feeding Compensations: Minimize environmental distractions;Slow  rate;Small sips/bites;Lingual sweep for clearance of pocketing Postural Changes and/or Swallow Maneuvers: Seated upright 90 degrees                Oral Care Recommendations: Oral care BID Follow up Recommendations: Skilled Nursing facility SLP Visit Diagnosis: Dysphagia, oropharyngeal phase (R13.12) Plan: Continue with current plan of care       GO              Marcus [name] SLP Student Clinician   Marcus [name] 06/20/2017, 12:24 PM

## 2017-06-20 NOTE — Progress Notes (Signed)
Peripherally Inserted Central Catheter/Midline Placement  The IV Nurse has discussed with the patient and/or persons authorized to consent for the patient, the purpose of this procedure and the potential benefits and risks involved with this procedure.  The benefits include less needle sticks, lab draws from the catheter, and the patient may be discharged home with the catheter. Risks include, but not limited to, infection, bleeding, blood clot (thrombus formation), and puncture of an artery; nerve damage and irregular heartbeat and possibility to perform a PICC exchange if needed/ordered by physician.  Alternatives to this procedure were also discussed.  B[employer] Power PICC patient education guide, fact sheet on infection prevention and patient information card has been provided to patient /or left at bedside  Consent signed by son due to altered mental status.Marland Kitchen.    PICC/Midline Placement Documentation  PICC Single Lumen 06/20/17 PICC Right Basilic 40 cm 0 cm (Active)  Indication for Insertion or Continuance of Line Home intravenous therapies (PICC only) 06/20/2017  6:27 PM  Exposed Catheter (cm) 0 cm 06/20/2017  6:27 PM  Site Assessment Clean;Dry;Intact 06/20/2017  6:27 PM  Line Status Flushed;Saline locked;Blood return noted 06/20/2017  6:27 PM  Dressing Type Transparent 06/20/2017  6:27 PM  Dressing Status Clean;Dry;Intact;Antimicrobial disc in place 06/20/2017  6:27 PM  Dressing Change Due 06/27/17 06/20/2017  6:27 PM       Caylan Chenard, Lajean ManesKerry Loraine 06/20/2017, 6:28 PM

## 2017-06-21 LAB — GLUCOSE, CAPILLARY
GLUCOSE-CAPILLARY: 102 mg/dL — AB (ref 65–99)
GLUCOSE-CAPILLARY: 103 mg/dL — AB (ref 65–99)
GLUCOSE-CAPILLARY: 116 mg/dL — AB (ref 65–99)
GLUCOSE-CAPILLARY: 119 mg/dL — AB (ref 65–99)
GLUCOSE-CAPILLARY: 136 mg/dL — AB (ref 65–99)

## 2017-06-21 LAB — CULTURE, BLOOD (ROUTINE X 2)
Culture: NO GROWTH
Culture: NO GROWTH
SPECIAL REQUESTS: ADEQUATE
Special Requests: ADEQUATE

## 2017-06-21 MED ORDER — THIAMINE HCL 100 MG PO TABS: 100.0000 mg | ORAL_TABLET | Freq: Every day | ORAL | 0 refills | Status: AC

## 2017-06-21 MED ORDER — PANTOPRAZOLE SODIUM 40 MG PO TBEC: 40.0000 mg | DELAYED_RELEASE_TABLET | Freq: Every day | ORAL | 0 refills | Status: DC

## 2017-06-21 MED ORDER — SODIUM CHLORIDE 0.9 % IV SOLN: 2.0000 g | INTRAVENOUS | 0 refills | Status: AC

## 2017-06-21 MED ORDER — SENNOSIDES-DOCUSATE SODIUM 8.6-50 MG PO TABS: 1.0000 | ORAL_TABLET | Freq: Two times a day (BID) | ORAL | 0 refills | Status: DC

## 2017-06-21 MED ORDER — POLYETHYLENE GLYCOL 3350 17 G PO PACK: 17.0000 g | PACK | Freq: Every day | ORAL | 0 refills | Status: DC | PRN

## 2017-06-21 NOTE — Progress Notes (Signed)
[facility]  She was not aware that pt may possibly discharge over the weekend.  Bjorn Loserhonda, Admission Coordinator stated that staff had schedule to discuss pt on Monday, however he has been accepted for placement.  Bjorn LoserRhonda would like for CSW on Monday to touch base with her for disposition.  CSW spoke with Dr. Dartha Lodgegbata and Raven RN concerning disposition plan.     Budd Palmerara Laurence Crofford LCSWA [phone]

## 2017-06-21 NOTE — Progress Notes (Signed)
PROGRESS NOTE    Marcus [name]  ZOX:[id number] DOB: 04-08-1966 DOA: 06/08/2017 PCP: Patient, No Pcp Per  Brief Narrative: PCCM transfer [facility] to [facility] on the 1/10 Patient is a 52 year old Caucasian male with past medical history significant for polysubstance abuse and hep C. Patient was found down by his significant other and, subsequently, brought to the emergency room. On presentation, patient was obtunded, and found to have severe metabolic encephalopathy, infective endocarditis, MSSA bacteremia, septic emboli/embolic strokes and left knee septic arthritis. Patient is being followed by infectious disease and orthopedic teams. Patient is currently on IV Ampicillin. Patient  underwent left knee I & D on 1/9. Patient needs of IV Ampicillin from first negative cultures. Blood cultures done on 06/16/17 have not grown any organisms till date.  06/20/2017 - Patient seen alongside patient's Mother, mother of the patient's child and the patient's Nurse. I updated all present. All questions asked were answered. Explained to all present that patient was medically stable for discharge to SNF once piccline is placed. Patient will need to complete course of antibiotics. Will await Piccline team.   06/21/17 - Piccline placed yesterday. Discharge instruction completed earlier but Case Manager called to inform me that the facility is not ready for the patient. No new complaints.  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 -History of polysubstance abuse however no clear history obtained to suggest IV drug use, could have started from Septic Knee -clinically improving, sepsis physiology resolved -Repeat blood cultures from 1/9 and 1/11 remain positive, blood cultures from 1/12, positive 1 in /2 -repeat Cx 1/14 negative x1day, if stays negative then place PICC line and DC to SNF for 6weeks of Abx until 2/25 -2D ECHO with mitral valve  vegetation -ID following, plan for 6 weeks of IV Nafcillin or Ampicillin from first negative cultures -low grade temp last pm -overall improving clinically, debilitated from embolic strokes -PT/OT eval completed, SNF recommended -CSW following for rehabilitation options, Nafcillin changed to Ampicillin due to financial concerns - Cultures done on 06/16/17 have not grown any organisms - For piccline placement. - Plan is for SNF placement.  Left Knee Septic Arthritis -Culture w/ MSSA -s/p incision and debridement on 1/9 by Dr. 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 Lasix is on hold.  Abnormal electrolytes (Hypokalemia and Hyponatremia) - Renal panel stat - Telemetry monitoring due to severe hypokalemia - Likely related to diuretics therapy - Will check urine sodium and magnesium level.  Hepatitis C antibody positive -Genotype IIb with 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 -Follow-up with ID after acute illness is resolved  Polysubstance abuse -Per family history of substance abuse but no known history of IV drug use as far as they know -UDS positive for cocaine, opiates and benzos on admission -counseled  Acute kidney injury -Likely secondary to sepsis, creatinine was 3.0 on admission now down to 1.4 -Baseline unknown -Kidney function improved, likely new baseline  Thrombocytopenia -Baseline unknown -Likely worsened by  sepsis in setting of Hep C. - Resolved  DVT prophylaxis:  lovenox Code Status:  full code  Family Communication: dtr at bedside  Disposition Plan: SNF    Consultants:   ID  PCCM  Ortho   neurology  Procedures: PROCEDURE: Arthrotomy of left knee and irrigation and debridement for septic arthritis 1/9   2-D echocardiogram   Antimicrobials:  Antibiotics Given (last 72 hours)    Date/Time Action Medication Dose Rate   06/18/17 2055 New Bag/Given   ampicillin (OMNIPEN) 2 g in sodium chloride 0.9 % 50 mL IVPB 2 g 150 mL/hr   06/18/17 2333 New Bag/Given   ampicillin (OMNIPEN) 2 g in sodium chloride 0.9 % 50 mL IVPB 2 g 150 mL/hr   06/19/17 0436 New Bag/Given   ampicillin (OMNIPEN) 2 g in sodium chloride 0.9 % 50 mL IVPB 2 g 150 mL/hr   06/19/17 4540 New Bag/Given   ampicillin (OMNIPEN) 2 g in sodium chloride 0.9 % 50 mL IVPB 2 g 150 mL/hr   06/19/17 1352 New Bag/Given   ampicillin (OMNIPEN) 2 g in sodium chloride 0.9 % 50 mL IVPB 2 g 150 mL/hr   06/19/17 1750 New Bag/Given   ampicillin (OMNIPEN) 2 g in sodium chloride 0.9 % 50 mL IVPB 2 g 150 mL/hr   06/19/17 2037 New Bag/Given   ampicillin (OMNIPEN) 2 g in sodium chloride 0.9 % 50 mL IVPB 2 g 150 mL/hr   06/20/17 0112 New Bag/Given   ampicillin (OMNIPEN) 2 g in sodium chloride 0.9 % 50 mL IVPB 2 g 150 mL/hr   06/20/17 0421 New Bag/Given   ampicillin (OMNIPEN) 2 g in sodium chloride 0.9 % 50 mL IVPB 2 g 150 mL/hr   06/20/17 0828 New Bag/Given   ampicillin (OMNIPEN) 2 g in sodium chloride 0.9 % 50 mL IVPB 2 g 150 mL/hr   06/20/17 1208 New Bag/Given   ampicillin (OMNIPEN) 2 g in sodium chloride 0.9 % 50 mL IVPB 2 g 150 mL/hr   06/20/17 1521 New Bag/Given   ampicillin (OMNIPEN) 2 g in sodium chloride 0.9 % 50 mL IVPB 2 g 150 mL/hr   06/20/17 1955 New Bag/Given   ampicillin (OMNIPEN) 2 g in sodium chloride 0.9 % 50 mL IVPB 2 g 150 mL/hr   06/21/17 0012 New Bag/Given   ampicillin (OMNIPEN) 2 g in sodium chloride 0.9 % 50 mL  IVPB 2 g 150 mL/hr   06/21/17 0447 New Bag/Given   ampicillin (OMNIPEN) 2 g in sodium chloride 0.9 % 50 mL IVPB 2 g 150 mL/hr   06/21/17 0809 New Bag/Given   ampicillin (OMNIPEN) 2 g in sodium chloride 0.9 % 50 mL IVPB 2 g 150 mL/hr   06/21/17 1111 New Bag/Given   ampicillin (OMNIPEN) 2 g in sodium chloride 0.9 % 50 mL IVPB 2 g 150 mL/hr   06/21/17 1535 New Bag/Given   ampicillin (OMNIPEN) 2 g in sodium chloride 0.9 % 50 mL IVPB 2 g 150 mL/hr      Subjective: - No new complaints. - Awaiting disposition  Objective: Vitals:   06/20/17 2119 06/21/17 0449 06/21/17 0452 06/21/17 1444  BP: 118/60  126/65 117/72  Pulse: (!) 110  (!) 104 (!) 104  Resp: (!) 24  (!) 24 20  Temp: 99.5 F (37.5 C)  97.7 F (36.5 C) 98.7 F (37.1 C)  TempSrc: Oral  Oral Oral  SpO2: 96%  98% 99%  Weight:  77.4 kg (170 lb 10.2 oz)    Height:        Intake/Output Summary (  Last 24 hours) at 06/21/2017 1704 Last data filed at 06/21/2017 1535 Gross per 24 hour  Intake 1443.67 ml  Output 525 ml  Net 918.67 ml   Filed Weights   06/18/17 0501 06/20/17 0635 06/21/17 0449  Weight: 75.2 kg (165 lb 12.6 oz) 76.7 kg (169 lb 1.5 oz) 77.4 kg (170 lb 10.2 oz)    Examination: Gen: Awake, Alert, Oriented X 3, weak looking. Mild facial asymmetry. HEENT: no JVD, poor dental hygiene Lungs: decreased at bases CVS: RRR,No Gallops,Rubs or new Murmurs Abd: soft, Non tender, non distended, BS present Extremities: Left knee with dressing, trace edema Skin: no new rashes  neuro left hemiparesis. Facial asymmetry.   Data Reviewed:   CBC: Recent Labs  Lab 06/15/17 0232 06/16/17 0235 06/18/17 0305  WBC 13.2* 16.0* 10.6*  HGB 8.9* 9.4* 8.6*  HCT 27.1* 28.5* 26.2*  MCV 84.2 84.6 85.6  PLT 288 319 316   Basic Metabolic Panel: Recent Labs  Lab 06/16/17 0235 06/17/17 0232 06/18/17 0305 06/19/17 0932 06/20/17 0234  NA 133* 132* 130* 134* 133*  K 3.3* 3.3* 2.9* 3.2* 3.8  CL 100* 98* 98* 100* 101  CO2 23 25  22 23 22   GLUCOSE 122* 123* 138* 110* 113*  BUN 16 18 19 16 19   CREATININE 1.37* 1.38* 1.40* 1.22 1.24  CALCIUM 7.5* 7.2* 7.8* 7.9* 7.9*  MG  --   --   --  1.8  --   PHOS  --   --   --  3.7 3.1   GFR: Estimated Creatinine Clearance: 75.1 mL/min (by C-G formula based on SCr of 1.24 mg/dL). Liver Function Tests: Recent Labs  Lab 06/19/17 0932 06/20/17 0234  ALBUMIN 1.2* 1.2*   No results for input(s): LIPASE, AMYLASE in the last 168 hours. No results for input(s): AMMONIA in the last 168 hours. Coagulation Profile: No results for input(s): INR, PROTIME in the last 168 hours. Cardiac Enzymes: Recent Labs  Lab 06/18/17 0305  CKTOTAL 330   BNP (last 3 results) No results for input(s): PROBNP in the last 8760 hours. HbA1C: No results for input(s): HGBA1C in the last 72 hours. CBG: Recent Labs  Lab 06/20/17 2004 06/21/17 0031 06/21/17 0449 06/21/17 0752 06/21/17 1142  GLUCAP 146* 116* 119* 103* 136*   Lipid Profile: No results for input(s): CHOL, HDL, LDLCALC, TRIG, CHOLHDL, LDLDIRECT in the last 72 hours. Thyroid Function Tests: No results for input(s): TSH, T4TOTAL, FREET4, T3FREE, THYROIDAB in the last 72 hours. Anemia Panel: No results for input(s): VITAMINB12, FOLATE, FERRITIN, TIBC, IRON, RETICCTPCT in the last 72 hours. Urine analysis:    Component Value Date/Time   COLORURINE AMBER (A) 06/08/2017 1724   APPEARANCEUR CLOUDY (A) 06/08/2017 1724   LABSPEC 1.016 06/08/2017 1724   PHURINE 5.0 06/08/2017 1724   GLUCOSEU NEGATIVE 06/08/2017 1724   HGBUR LARGE (A) 06/08/2017 1724   BILIRUBINUR NEGATIVE 06/08/2017 1724   KETONESUR NEGATIVE 06/08/2017 1724   PROTEINUR NEGATIVE 06/08/2017 1724   NITRITE NEGATIVE 06/08/2017 1724   LEUKOCYTESUR NEGATIVE 06/08/2017 1724   Sepsis Labs: @LABRCNTIP (procalcitonin:4,lacticidven:4)  ) Recent Results (from the past 240 hour(s))  Culture, blood (routine x 2)     Status: Abnormal   Collection Time: 06/13/17 11:59 AM    Result Value Ref Range Status   Specimen Description BLOOD LEFT ANTECUBITAL  Final   Special Requests IN PEDIATRIC BOTTLE Blood Culture adequate volume  Final   Culture  Setup Time   Final    GRAM POSITIVE COCCI IN CLUSTERS  IN PEDIATRIC BOTTLE CRITICAL VALUE NOTED.  VALUE IS CONSISTENT WITH PREVIOUSLY REPORTED AND CALLED VALUE.    Culture (A)  Final    STAPHYLOCOCCUS AUREUS SUSCEPTIBILITIES PERFORMED ON PREVIOUS CULTURE WITHIN THE LAST 5 DAYS.    Report Status 06/15/2017 FINAL  Final  Culture, blood (routine x 2)     Status: Abnormal   Collection Time: 06/13/17 12:07 PM  Result Value Ref Range Status   Specimen Description BLOOD LEFT ANTECUBITAL  Final   Special Requests IN PEDIATRIC BOTTLE Blood Culture adequate volume  Final   Culture  Setup Time   Final    GRAM POSITIVE COCCI IN CLUSTERS IN PEDIATRIC BOTTLE CRITICAL RESULT CALLED TO, READ BACK BY AND VERIFIED WITH: T EGAN,PHARMD AT 0981 06/14/17 BY L BENFIELD    Culture (A)  Final    STAPHYLOCOCCUS AUREUS SUSCEPTIBILITIES PERFORMED ON PREVIOUS CULTURE WITHIN THE LAST 5 DAYS.    Report Status 06/15/2017 FINAL  Final  Culture, blood (routine x 2)     Status: None   Collection Time: 06/14/17  9:10 AM  Result Value Ref Range Status   Specimen Description BLOOD RIGHT ARM  Final   Special Requests   Final    BOTTLES DRAWN AEROBIC ONLY Blood Culture adequate volume   Culture NO GROWTH 5 DAYS  Final   Report Status 06/19/2017 FINAL  Final  Culture, blood (routine x 2)     Status: Abnormal   Collection Time: 06/14/17  9:20 AM  Result Value Ref Range Status   Specimen Description BLOOD RIGHT HAND  Final   Special Requests IN PEDIATRIC BOTTLE Blood Culture adequate volume  Final   Culture  Setup Time   Final    GRAM POSITIVE COCCI IN CLUSTERS IN PEDIATRIC BOTTLE CRITICAL RESULT CALLED TO, READ BACK BY AND VERIFIED WITH: K HYATT,PHARMD AT 0723 06/15/17 BY L BENFIELD    Culture (A)  Final    STAPHYLOCOCCUS  AUREUS SUSCEPTIBILITIES PERFORMED ON PREVIOUS CULTURE WITHIN THE LAST 5 DAYS.    Report Status 06/16/2017 FINAL  Final  Culture, blood (routine x 2)     Status: None   Collection Time: 06/16/17  9:57 AM  Result Value Ref Range Status   Specimen Description BLOOD RIGHT HAND  Final   Special Requests   Final    BOTTLES DRAWN AEROBIC AND ANAEROBIC Blood Culture adequate volume   Culture NO GROWTH 5 DAYS  Final   Report Status 06/21/2017 FINAL  Final  Culture, blood (routine x 2)     Status: None   Collection Time: 06/16/17 10:00 AM  Result Value Ref Range Status   Specimen Description BLOOD RIGHT HAND  Final   Special Requests   Final    BOTTLES DRAWN AEROBIC AND ANAEROBIC Blood Culture adequate volume   Culture NO GROWTH 5 DAYS  Final   Report Status 06/21/2017 FINAL  Final         Radiology Studies: No results found.      Scheduled Meds: . enoxaparin (LOVENOX) injection  40 mg Subcutaneous Q24H  . feeding supplement  1 Container Oral TID BM  . Influenza vac split quadrivalent PF  0.5 mL Intramuscular Tomorrow-1000  . pantoprazole  40 mg Oral Q1200  . senna-docusate  1 tablet Oral BID  . thiamine  100 mg Oral Daily   Continuous Infusions: . sodium chloride 250 mL (06/20/17 1914)  . ampicillin (OMNIPEN) IV Stopped (06/21/17 1555)     LOS: 12 days    Time spent:  Berton Mount, MD [facility] Page via [email or web address], password [email or web address] After 7PM please contact night-coverage  06/21/2017, 5:04 PM

## 2017-06-21 NOTE — Discharge Summary (Signed)
Physician Discharge Summary  Patient ID: Marcus [name] MRN: [id number] DOB/AGE: 52/08/1965 52 y.o.  Admit date: 06/08/2017 Discharge date: 06/23/2017  Admission Diagnoses:  Discharge Diagnoses:  Principal Problem:   Bacteremia due to methicillin susceptible Staphylococcus aureus (MSSA) Active Problems:   Encephalopathy   Meningitis due to bacteria   Septic embolism (HCC)   Sepsis (HCC)   AKI (acute kidney injury) (HCC)   Elevated liver enzymes   Normocytic anemia   Thrombocytopenia (HCC)   Polysubstance abuse (HCC)   Endocarditis of mitral valve   Septic arthritis of knee, left (HCC)   Poor dentition   Hepatitis C antibody test positive   Acute bacterial endocarditis   Altered mental status   Hyperkalemia   Malnutrition of moderate degree   Pressure injury of skin   Discharged Condition: stable  Hospital Course: Patient is a 52 year old male with past medical history significant for polysubstance abuse and hep C. Patient was found down by his significant other, subsequently, patient was brought to the emergency room. On presentation, patient was obtunded and found to have severe metabolic encephalopathy, infective endocarditis, MSSA bacteremia, septic emboli/embolic strokes and left knee septic arthritis. Patient was initially admitted by the Intensive care team, and later transferred to the Hospitalist service. Patient's antibiotics was adjusted as per culture results. Patient will complete 6 weeks of IV Ampicillin from 06/16/2017. Patient was seen by Orthopedic team for left septic knee that was drained; and Neurology team for septic emboli to the brain. Patient has weakness, worse on the left side, and will benefit from rehab. Kindly see speech therapy recommendation below. Patient will also benefit [facility] one can three times daily.  Consults: ID, neurology and orthopedic surgery   Speech Therapy:  Diet recommendations: Dysphagia 1 (puree);Thin liquid Liquids  provided via: Cup;Straw Medication Administration: Whole meds with puree Supervision: Staff to assist with self feeding;Full supervision/cueing for compensatory strategies Compensations: Minimize environmental distractions;Slow rate;Small sips/bites;Lingual sweep for clearance of pocketing Postural Changes and/or Swallow Maneuvers: Seated upright 90 degrees       Oral Care Recommendations: Oral care BID  Significant Diagnostic Studies: microbiology: blood culture: positive for MSSA.   MRI Brain: Numerous small foci of acute/early subacute infarction are present involving right-greater-than-left posterior hemispheres, basal ganglia, brainstem, and cerebellum. Multiple vascular territories suggests embolic phenomenon. Petechial hemorrhage is present within the the larger right parietal and right occipital infarcts. No mass effect.  ECHO - Mitral valve:  There is a shaggy rounded mobile density on the anterior mitral valve leaflet measuring 1.31 x 1.56 cm consistent with vegetation.  Structurally normal valve.   Mobility was not restricted.  Doppler:  Transvalvular velocity was within the normal range. There was no evidence for stenosis. There was mild regurgitation.    Peak gradient: 3 mm Hg.  Treatments: See above  Discharge Exam: Blood pressure 126/65, pulse (!) 104, temperature 97.7 F (36.5 C), temperature source Oral, resp. rate (!) 24, height 5\' 11"  (1.803 m), weight 77.4 kg (170 lb 10.2 oz), SpO2 98 %.  Disposition: 01-Home or Self Care  Discharge Instructions    Ambulatory referral to Neurology   Complete by:  As directed    An appointment is requested in approximately: 6 weeks Follow up with stroke clinic (Dr Pearlean BrownieSethi preferred, if not available, then consider Sylvie FarrierXu, Sethi, [facility] or Lucia GaskinsAhern whoever is available) [facility] in about 6-8 weeks. Thanks.   Call MD for:   Complete by:  As directed    Call MD with worsening  of symptoms   Diet - low sodium heart healthy   Complete by:   As directed    Discharge instructions   Complete by:  As directed    Bost/Resource Breeze 1 container Tid   Increase activity slowly   Complete by:  As directed      Allergies as of 06/21/2017   No Known Allergies     Medication List    STOP taking these medications   acetaminophen 500 MG tablet Commonly known as:  TYLENOL   amoxicillin-clavulanate 875-125 MG tablet Commonly known as:  AUGMENTIN   diazepam 5 MG tablet Commonly known as:  VALIUM   ibuprofen 200 MG tablet Commonly known as:  ADVIL,MOTRIN   oxyCODONE-acetaminophen 5-325 MG tablet Commonly known as:  PERCOCET/ROXICET     TAKE these medications   ampicillin 2 g in sodium chloride 0.9 % 50 mL Inject 2 g into the vein every 4 (four) hours.   pantoprazole 40 MG tablet Commonly known as:  PROTONIX Take 1 tablet (40 mg total) by mouth daily at 12 noon.   polyethylene glycol packet Commonly known as:  MIRALAX / GLYCOLAX Take 17 g by mouth daily as needed for mild constipation.   senna-docusate 8.6-50 MG tablet Commonly known as:  Senokot-S Take 1 tablet by mouth 2 (two) times daily.   thiamine 100 MG tablet Take 1 tablet (100 mg total) by mouth daily. Start taking on:  06/22/2017      Follow-up Information    Micki Riley, MD. Schedule an appointment as soon as possible for a visit in 6 week(s).   Specialties:  Neurology, Radiology Contact information: [address] [place] [address] [phone]        Tarry Kos, MD Follow up in 2 week(s).   Specialty:  Orthopedic Surgery Contact information: [address] [place] [address] [phone]           Signed: Barnetta Chapel 06/23/2017, 11:06 AM

## 2017-06-22 LAB — GLUCOSE, CAPILLARY
GLUCOSE-CAPILLARY: 114 mg/dL — AB (ref 65–99)
GLUCOSE-CAPILLARY: 107 mg/dL — AB (ref 65–99)
GLUCOSE-CAPILLARY: 160 mg/dL — AB (ref 65–99)
GLUCOSE-CAPILLARY: 111 mg/dL — AB (ref 65–99)
Glucose-Capillary: 118 mg/dL — ABNORMAL HIGH (ref 65–99)
Glucose-Capillary: 155 mg/dL — ABNORMAL HIGH (ref 65–99)

## 2017-06-22 NOTE — Progress Notes (Signed)
PROGRESS NOTE    Marcus [name]  ONG:[id number] DOB: 29-Jan-1966 DOA: 06/08/2017 PCP: Patient, No Pcp Per  Brief Narrative: [facility]10 Patient is a 52 year old Caucasian male with past medical history significant for polysubstance abuse and hep C. Patient was found down by his significant other and, subsequently, brought to the emergency room. On presentation, patient was obtunded, and found to have severe metabolic encephalopathy, infective endocarditis, MSSA bacteremia, septic emboli/embolic strokes and left knee septic arthritis. Patient is being followed by infectious disease and orthopedic teams. Patient is currently on IV Ampicillin. Patient  underwent left knee I & D on 1/9. Patient needs of IV Ampicillin from first negative cultures. Blood cultures done on 06/16/17 have not grown any organisms till date.  06/20/2017 - Patient seen alongside patient's Mother, mother of the patient's child and the patient's Nurse. I updated all present. All questions asked were answered. Explained to all present that patient was medically stable for discharge to SNF once piccline is placed. Patient will need to complete course of antibiotics. Will await Piccline team.   06/21/17 - Piccline placed yesterday. Discharge instruction completed earlier but Case Manager called to inform me that the facility is not ready for the patient. No new complaints.  06/22/17 - Patient seen. No new changes. Awaiting DC (Likely DC in am).   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 -History of polysubstance abuse however no clear history obtained to suggest IV drug use, could have started from Septic Knee -clinically improving, sepsis physiology resolved -Repeat blood cultures from 1/9 and 1/11 remain positive, blood cultures from 1/12, positive 1 in /2 -repeat Cx 1/14 negative x1day, if stays negative then place PICC line  and DC to SNF for 6weeks of Abx until 2/25 -2D ECHO with mitral valve vegetation -ID following, plan for 6 weeks of IV Nafcillin or Ampicillin from first negative cultures -low grade temp last pm -overall improving clinically, debilitated from embolic strokes -PT/OT eval completed, SNF recommended -CSW following for rehabilitation options, Nafcillin changed to Ampicillin due to financial concerns - Cultures done on 06/16/17 have not grown any organisms - For piccline placement. - Plan is for SNF placement.  Left Knee Septic Arthritis -Culture w/ MSSA -s/p incision and debridement on 1/9 by Dr. 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 Lasix is on hold.  Abnormal electrolytes (Hypokalemia and Hyponatremia) - Renal panel stat - Telemetry monitoring due to severe hypokalemia - Likely related to diuretics therapy - Will check urine sodium and magnesium level.  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 - Resolved  DVT prophylaxis:  lovenox Code Status:  full code  Family Communication: dtr at bedside  Disposition Plan: SNF    Consultants:   ID  PCCM  Ortho   neurology  Procedures: PROCEDURE: Arthrotomy of left knee and irrigation and debridement for septic arthritis 1/9   2-D echocardiogram   Antimicrobials:  Antibiotics Given (last 72 hours)    Date/Time Action Medication Dose Rate   06/19/17 1352 New Bag/Given   ampicillin (OMNIPEN) 2 g in sodium chloride 0.9 % 50 mL IVPB 2 g 150 mL/hr   06/19/17 1750 New Bag/Given   ampicillin (OMNIPEN) 2 g in sodium chloride 0.9 % 50 mL IVPB 2 g 150 mL/hr   06/19/17 2037 New Bag/Given   ampicillin (OMNIPEN) 2 g in sodium chloride 0.9 % 50 mL IVPB 2 g 150 mL/hr   06/20/17 0112 New Bag/Given   ampicillin (OMNIPEN) 2 g in sodium chloride 0.9 % 50 mL IVPB 2 g 150 mL/hr   06/20/17 0421 New Bag/Given   ampicillin (OMNIPEN) 2 g in sodium chloride 0.9 % 50 mL IVPB 2 g 150 mL/hr   06/20/17 0828 New Bag/Given   ampicillin (OMNIPEN) 2 g in sodium chloride 0.9 % 50 mL IVPB 2 g 150 mL/hr   06/20/17 1208 New Bag/Given   ampicillin (OMNIPEN) 2 g in sodium chloride 0.9 % 50 mL IVPB 2 g 150 mL/hr   06/20/17 1521 New Bag/Given   ampicillin (OMNIPEN) 2 g in sodium chloride 0.9 % 50 mL IVPB 2 g 150 mL/hr   06/20/17 1955 New Bag/Given   ampicillin (OMNIPEN) 2 g in sodium chloride 0.9 % 50 mL IVPB 2 g 150 mL/hr   06/21/17 0012 New Bag/Given   ampicillin (OMNIPEN) 2 g in sodium chloride 0.9 % 50 mL IVPB 2 g 150 mL/hr   06/21/17 0447 New Bag/Given   ampicillin (OMNIPEN) 2 g in sodium chloride 0.9 % 50 mL IVPB 2 g 150 mL/hr   06/21/17 0809 New Bag/Given   ampicillin (OMNIPEN) 2 g in sodium chloride 0.9 % 50 mL IVPB 2 g 150 mL/hr   06/21/17 1111 New Bag/Given   ampicillin (OMNIPEN) 2 g in sodium chloride 0.9 % 50 mL IVPB 2 g 150 mL/hr   06/21/17 1535  New Bag/Given   ampicillin (OMNIPEN) 2 g in sodium chloride 0.9 % 50 mL IVPB 2 g 150 mL/hr   06/21/17 2027 New Bag/Given   ampicillin (OMNIPEN) 2 g in sodium chloride 0.9 % 50 mL IVPB 2 g 150 mL/hr   06/21/17 2351 New Bag/Given   ampicillin (OMNIPEN) 2 g in sodium chloride 0.9 % 50 mL IVPB 2 g 150 mL/hr   06/22/17 0431 New Bag/Given   ampicillin (OMNIPEN) 2 g in sodium chloride 0.9 % 50 mL IVPB 2 g 150 mL/hr   06/22/17 9604 New Bag/Given   ampicillin (OMNIPEN) 2 g in sodium chloride 0.9 % 50 mL IVPB 2 g 150 mL/hr   06/22/17 1154 New Bag/Given   ampicillin (OMNIPEN) 2 g in sodium chloride 0.9 % 50 mL IVPB 2 g 150 mL/hr      Subjective: - No new complaints. - Awaiting disposition  Objective: Vitals:   06/21/17 1444 06/21/17 2211 06/22/17 0500 06/22/17 0613  BP: 117/72 121/61  120/66  Pulse: (!) 104 (!) 111  (!) 109  Resp:    20  Temp: 98.7 F (37.1 C) 99.3 F (37.4 C)  99.4  F (37.4 C)  TempSrc: Oral     SpO2: 99% 97%  99%  Weight:   77.9 kg (171 lb 11.8 oz)   Height:        Intake/Output Summary (Last 24 hours) at 06/22/2017 1211 Last data filed at 06/22/2017 0615 Gross per 24 hour  Intake 1077.84 ml  Output 2400 ml  Net -1322.16 ml   Filed Weights   06/20/17 0635 06/21/17 0449 06/22/17 0500  Weight: 76.7 kg (169 lb 1.5 oz) 77.4 kg (170 lb 10.2 oz) 77.9 kg (171 lb 11.8 oz)    Examination: Gen: Awake, Alert, Oriented X 3, weak looking. Mild facial asymmetry. HEENT: no JVD, poor dental hygiene Lungs: decreased at bases CVS: RRR,No Gallops,Rubs or new Murmurs Abd: soft, Non tender, non distended, BS present Extremities: Left knee with dressing, trace edema Skin: no new rashes  neuro left hemiparesis. Facial asymmetry.   Data Reviewed:   CBC: Recent Labs  Lab 06/16/17 0235 06/18/17 0305  WBC 16.0* 10.6*  HGB 9.4* 8.6*  HCT 28.5* 26.2*  MCV 84.6 85.6  PLT 319 316   Basic Metabolic Panel: Recent Labs  Lab 06/16/17 0235 06/17/17 0232 06/18/17 0305  06/19/17 0932 06/20/17 0234  NA 133* 132* 130* 134* 133*  K 3.3* 3.3* 2.9* 3.2* 3.8  CL 100* 98* 98* 100* 101  CO2 23 25 22 23 22   GLUCOSE 122* 123* 138* 110* 113*  BUN 16 18 19 16 19   CREATININE 1.37* 1.38* 1.40* 1.22 1.24  CALCIUM 7.5* 7.2* 7.8* 7.9* 7.9*  MG  --   --   --  1.8  --   PHOS  --   --   --  3.7 3.1   GFR: Estimated Creatinine Clearance: 75.1 mL/min (by C-G formula based on SCr of 1.24 mg/dL). Liver Function Tests: Recent Labs  Lab 06/19/17 0932 06/20/17 0234  ALBUMIN 1.2* 1.2*   No results for input(s): LIPASE, AMYLASE in the last 168 hours. No results for input(s): AMMONIA in the last 168 hours. Coagulation Profile: No results for input(s): INR, PROTIME in the last 168 hours. Cardiac Enzymes: Recent Labs  Lab 06/18/17 0305  CKTOTAL 330   BNP (last 3 results) No results for input(s): PROBNP in the last 8760 hours. HbA1C: No results for input(s): HGBA1C in the last 72 hours. CBG: Recent Labs  Lab 06/21/17 2013 06/22/17 0109 06/22/17 0428 06/22/17 0837 06/22/17 1204  GLUCAP 102* 118* 114* 107* 155*   Lipid Profile: No results for input(s): CHOL, HDL, LDLCALC, TRIG, CHOLHDL, LDLDIRECT in the last 72 hours. Thyroid Function Tests: No results for input(s): TSH, T4TOTAL, FREET4, T3FREE, THYROIDAB in the last 72 hours. Anemia Panel: No results for input(s): VITAMINB12, FOLATE, FERRITIN, TIBC, IRON, RETICCTPCT in the last 72 hours. Urine analysis:    Component Value Date/Time   COLORURINE AMBER (A) 06/08/2017 1724   APPEARANCEUR CLOUDY (A) 06/08/2017 1724   LABSPEC 1.016 06/08/2017 1724   PHURINE 5.0 06/08/2017 1724   GLUCOSEU NEGATIVE 06/08/2017 1724   HGBUR LARGE (A) 06/08/2017 1724   BILIRUBINUR NEGATIVE 06/08/2017 1724   KETONESUR NEGATIVE 06/08/2017 1724   PROTEINUR NEGATIVE 06/08/2017 1724   NITRITE NEGATIVE 06/08/2017 1724   LEUKOCYTESUR NEGATIVE 06/08/2017 1724   Sepsis Labs: @LABRCNTIP (procalcitonin:4,lacticidven:4)  ) Recent  Results (from the past 240 hour(s))  Culture, blood (routine x 2)     Status: Abnormal   Collection Time: 06/13/17 11:59 AM  Result Value Ref Range Status   Specimen Description BLOOD LEFT ANTECUBITAL  Final  Special Requests IN PEDIATRIC BOTTLE Blood Culture adequate volume  Final   Culture  Setup Time   Final    GRAM POSITIVE COCCI IN CLUSTERS IN PEDIATRIC BOTTLE CRITICAL VALUE NOTED.  VALUE IS CONSISTENT WITH PREVIOUSLY REPORTED AND CALLED VALUE.    Culture (A)  Final    STAPHYLOCOCCUS AUREUS SUSCEPTIBILITIES PERFORMED ON PREVIOUS CULTURE WITHIN THE LAST 5 DAYS.    Report Status 06/15/2017 FINAL  Final  Culture, blood (routine x 2)     Status: Abnormal   Collection Time: 06/13/17 12:07 PM  Result Value Ref Range Status   Specimen Description BLOOD LEFT ANTECUBITAL  Final   Special Requests IN PEDIATRIC BOTTLE Blood Culture adequate volume  Final   Culture  Setup Time   Final    GRAM POSITIVE COCCI IN CLUSTERS IN PEDIATRIC BOTTLE CRITICAL RESULT CALLED TO, READ BACK BY AND VERIFIED WITH: T EGAN,PHARMD AT [id number] 06/14/17 BY L BENFIELD    Culture (A)  Final    STAPHYLOCOCCUS AUREUS SUSCEPTIBILITIES PERFORMED ON PREVIOUS CULTURE WITHIN THE LAST 5 DAYS.    Report Status 06/15/2017 FINAL  Final  Culture, blood (routine x 2)     Status: None   Collection Time: 06/14/17  9:10 AM  Result Value Ref Range Status   Specimen Description BLOOD RIGHT ARM  Final   Special Requests   Final    BOTTLES DRAWN AEROBIC ONLY Blood Culture adequate volume   Culture NO GROWTH 5 DAYS  Final   Report Status 06/19/2017 FINAL  Final  Culture, blood (routine x 2)     Status: Abnormal   Collection Time: 06/14/17  9:20 AM  Result Value Ref Range Status   Specimen Description BLOOD RIGHT HAND  Final   Special Requests IN PEDIATRIC BOTTLE Blood Culture adequate volume  Final   Culture  Setup Time   Final    GRAM POSITIVE COCCI IN CLUSTERS IN PEDIATRIC BOTTLE CRITICAL RESULT CALLED TO, READ BACK BY AND  VERIFIED WITH: K HYATT,PHARMD AT 0723 06/15/17 BY L BENFIELD    Culture (A)  Final    STAPHYLOCOCCUS AUREUS SUSCEPTIBILITIES PERFORMED ON PREVIOUS CULTURE WITHIN THE LAST 5 DAYS.    Report Status 06/16/2017 FINAL  Final  Culture, blood (routine x 2)     Status: None   Collection Time: 06/16/17  9:57 AM  Result Value Ref Range Status   Specimen Description BLOOD RIGHT HAND  Final   Special Requests   Final    BOTTLES DRAWN AEROBIC AND ANAEROBIC Blood Culture adequate volume   Culture NO GROWTH 5 DAYS  Final   Report Status 06/21/2017 FINAL  Final  Culture, blood (routine x 2)     Status: None   Collection Time: 06/16/17 10:00 AM  Result Value Ref Range Status   Specimen Description BLOOD RIGHT HAND  Final   Special Requests   Final    BOTTLES DRAWN AEROBIC AND ANAEROBIC Blood Culture adequate volume   Culture NO GROWTH 5 DAYS  Final   Report Status 06/21/2017 FINAL  Final         Radiology Studies: No results found.      Scheduled Meds: . enoxaparin (LOVENOX) injection  40 mg Subcutaneous Q24H  . feeding supplement  1 Container Oral TID BM  . Influenza vac split quadrivalent PF  0.5 mL Intramuscular Tomorrow-1000  . pantoprazole  40 mg Oral Q1200  . senna-docusate  1 tablet Oral BID  . thiamine  100 mg Oral Daily   Continuous Infusions: .  sodium chloride 250 mL (06/20/17 1610)  . ampicillin (OMNIPEN) IV 2 g (06/22/17 1154)     LOS: 13 days    Time spent:    Berton Mount, MD [facility] Page via [email or web address], password [email or web address] After 7PM please contact night-coverage  06/22/2017, 12:11 PM

## 2017-06-22 NOTE — Progress Notes (Signed)
CCMD notified RN that patient had short run of 1st and 2nd degree HB with rates of 30's - 40's but is now NS with rates in 90's.  RN paged C. Bodenheimer, NP to make aware.  P.J. Henderson NewcomerSexton, RN

## 2017-06-23 LAB — GLUCOSE, CAPILLARY
GLUCOSE-CAPILLARY: 116 mg/dL — AB (ref 65–99)
GLUCOSE-CAPILLARY: 109 mg/dL — AB (ref 65–99)
Glucose-Capillary: 112 mg/dL — ABNORMAL HIGH (ref 65–99)

## 2017-06-23 MED ORDER — HEPARIN SOD (PORK) LOCK FLUSH 100 UNIT/ML IV SOLN
250.0000 [IU] | INTRAVENOUS | Status: AC | PRN
  Administered 2017-06-23: 250 [IU]

## 2017-06-23 NOTE — Clinical Social Work Placement (Signed)
   CLINICAL SOCIAL WORK PLACEMENT  NOTE  Date:  06/23/2017  Patient Details  Name: Marcus [name] MRN: [id number] Date of Birth: 01/31/1966  Clinical Social Work is seeking post-discharge placement for this patient at the Skilled  Nursing Facility level of care (*CSW will initial, date and re-position this form in  chart as items are completed):  Yes   Patient/family provided with [facility] Clinical Social Work Department's list of facilities offering this level of care within the geographic area requested by the patient (or if unable, by the patient's family).  Yes   Patient/family informed of their freedom to choose among providers that offer the needed level of care, that participate in Medicare, Medicaid or managed care program needed by the patient, have an available bed and are willing to accept the patient.  Yes   Patient/family informed of [facility]'s ownership interest in [facility] and [facility], as well as of the fact that they are under no obligation to receive care at these facilities.  PASRR submitted to EDS on 06/12/17     PASRR number received on 06/12/17     Existing PASRR number confirmed on       FL2 transmitted to all facilities in geographic area requested by pt/family on 06/12/17     FL2 transmitted to all facilities within larger geographic area on       Patient informed that his/her managed care company has contracts with or will negotiate with certain facilities, including the following:        Yes   Patient/family informed of bed offers received.  [facility] ([facility])     Physician recommends and patient chooses bed at      Patient to be transferred to [facility] on 06/23/17.  Patient to be transferred to facility by [facility]     Patient family notified on 06/23/17 of transfer.  Name of family member notified:  TurkeyVictoria     PHYSICIAN Please sign FL2     Additional Comment:     _______________________________________________ Burna SisUris, Mica Ramdass H, LCSW 06/23/2017, 10:51 AM

## 2017-06-23 NOTE — Progress Notes (Signed)
EMS was questioning address of facility,  called facility to verify, [facility] is correct

## 2017-06-23 NOTE — Progress Notes (Signed)
[facility] to be D/C'd Skilled nursing facility per MD order.  Discussed with the patient and all questions fully answered.  No evidence of skin tears noted.  D/c education completed with patient/family including follow up instructions, medication list, d/c activities limitations if indicated, with other d/c instructions as indicated by MD - patient able to verbalize understanding, all questions fully answered.   Patient instructed to return to ED, call 911, or call MD for any changes in condition.   [facility]   Report called to [employer] EnergyShelia Miller   Wahid Holley K Takari Duncombe 06/23/2017 1:25 PM

## 2017-06-23 NOTE — Progress Notes (Signed)
[facility] Anticipated discharge date: 1/21 Family notified: at bedside and called pt mom Transportation by [facility]- called at 11:30am  CSW signing off.  Burna SisJenna H. [place] Stell, LCSW Clinical Social Worker [phone]

## 2017-06-26 ENCOUNTER — Ambulatory Visit (INDEPENDENT_AMBULATORY_CARE_PROVIDER_SITE_OTHER): Payer: Self-pay | Admitting: Orthopaedic Surgery

## 2017-06-26 ENCOUNTER — Telehealth (INDEPENDENT_AMBULATORY_CARE_PROVIDER_SITE_OTHER): Payer: Self-pay | Admitting: Orthopaedic Surgery

## 2017-06-26 DIAGNOSIS — M00062 Staphylococcal arthritis, left knee: Secondary | ICD-10-CM

## 2017-06-26 NOTE — Progress Notes (Signed)
Patient is two-week status post arthrotomy and washout of a septic left knee joint from IV drug use.  He is currently at a rehab facility and on IV antibiotics.  He comes in today for suture removal.  Physical exam shows a moderate joint effusion that is painful to range of motion with significant crepitus.  His incision is healed.  There is no warmth to the knee.  There is no cellulitis or redness.  His x-rays demonstrate that he has advanced degenerative joint disease at baseline.  The sutures removed today.  From my standpoint I think the effusion is postsurgical.  Recommend aggressive schedule icing and NSAIDs as needed.  Questions encouraged and answered.  Follow-up as needed.

## 2017-06-26 NOTE — Telephone Encounter (Signed)
Darl PikesSusan with [facility] called needing to clarify orders for the patient. The number to contact Darl PikesSusan is [phone]

## 2017-06-30 NOTE — Telephone Encounter (Signed)
Called Marcus PikesSusan back to see what she needed clarification on. She would like to know how often how many times to apply ice.

## 2017-06-30 NOTE — Telephone Encounter (Signed)
4-5x a day

## 2017-07-01 NOTE — Telephone Encounter (Signed)
Called Darl PikesSusan back she was unavailable spoke to [place] and would pass message along

## 2017-07-17 ENCOUNTER — Inpatient Hospital Stay: Payer: Self-pay | Admitting: Internal Medicine

## 2017-08-09 ENCOUNTER — Emergency Department (HOSPITAL_COMMUNITY): Payer: Medicaid Other

## 2017-08-09 ENCOUNTER — Encounter (HOSPITAL_COMMUNITY): Payer: Self-pay

## 2017-08-09 ENCOUNTER — Other Ambulatory Visit: Payer: Self-pay

## 2017-08-09 ENCOUNTER — Inpatient Hospital Stay (HOSPITAL_COMMUNITY)
Admission: EM | Admit: 2017-08-09 | Discharge: 2017-08-27 | DRG: 853 | Disposition: A | Discharge status: Home Health | Payer: Medicaid Other | Attending: Family Medicine | Admitting: Family Medicine

## 2017-08-09 DIAGNOSIS — Z8661 Personal history of infections of the central nervous system: Secondary | ICD-10-CM

## 2017-08-09 DIAGNOSIS — M270 Developmental disorders of jaws: Secondary | ICD-10-CM | POA: Diagnosis present

## 2017-08-09 DIAGNOSIS — M4804 Spinal stenosis, thoracic region: Secondary | ICD-10-CM | POA: Diagnosis present

## 2017-08-09 DIAGNOSIS — E872 Acidosis: Secondary | ICD-10-CM | POA: Diagnosis present

## 2017-08-09 DIAGNOSIS — I5031 Acute diastolic (congestive) heart failure: Secondary | ICD-10-CM | POA: Diagnosis present

## 2017-08-09 DIAGNOSIS — E43 Unspecified severe protein-calorie malnutrition: Secondary | ICD-10-CM | POA: Diagnosis present

## 2017-08-09 DIAGNOSIS — M4624 Osteomyelitis of vertebra, thoracic region: Secondary | ICD-10-CM | POA: Diagnosis present

## 2017-08-09 DIAGNOSIS — S22009A Unspecified fracture of unspecified thoracic vertebra, initial encounter for closed fracture: Secondary | ICD-10-CM | POA: Diagnosis present

## 2017-08-09 DIAGNOSIS — D649 Anemia, unspecified: Secondary | ICD-10-CM | POA: Diagnosis present

## 2017-08-09 DIAGNOSIS — I272 Pulmonary hypertension, unspecified: Secondary | ICD-10-CM | POA: Diagnosis present

## 2017-08-09 DIAGNOSIS — I313 Pericardial effusion (noninflammatory): Secondary | ICD-10-CM | POA: Diagnosis present

## 2017-08-09 DIAGNOSIS — Z681 Body mass index (BMI) 19 or less, adult: Secondary | ICD-10-CM | POA: Diagnosis not present

## 2017-08-09 DIAGNOSIS — R600 Localized edema: Secondary | ICD-10-CM | POA: Diagnosis present

## 2017-08-09 DIAGNOSIS — R627 Adult failure to thrive: Secondary | ICD-10-CM | POA: Diagnosis present

## 2017-08-09 DIAGNOSIS — J9602 Acute respiratory failure with hypercapnia: Secondary | ICD-10-CM | POA: Diagnosis present

## 2017-08-09 DIAGNOSIS — Z9181 History of falling: Secondary | ICD-10-CM

## 2017-08-09 DIAGNOSIS — I330 Acute and subacute infective endocarditis: Secondary | ICD-10-CM | POA: Diagnosis present

## 2017-08-09 DIAGNOSIS — Z79899 Other long term (current) drug therapy: Secondary | ICD-10-CM

## 2017-08-09 DIAGNOSIS — F1310 Sedative, hypnotic or anxiolytic abuse, uncomplicated: Secondary | ICD-10-CM | POA: Diagnosis present

## 2017-08-09 DIAGNOSIS — Y95 Nosocomial condition: Secondary | ICD-10-CM | POA: Diagnosis present

## 2017-08-09 DIAGNOSIS — J9811 Atelectasis: Secondary | ICD-10-CM | POA: Diagnosis present

## 2017-08-09 DIAGNOSIS — F1910 Other psychoactive substance abuse, uncomplicated: Secondary | ICD-10-CM

## 2017-08-09 DIAGNOSIS — Z87891 Personal history of nicotine dependence: Secondary | ICD-10-CM

## 2017-08-09 DIAGNOSIS — J9601 Acute respiratory failure with hypoxia: Secondary | ICD-10-CM | POA: Diagnosis present

## 2017-08-09 DIAGNOSIS — M869 Osteomyelitis, unspecified: Secondary | ICD-10-CM

## 2017-08-09 DIAGNOSIS — M4644 Discitis, unspecified, thoracic region: Secondary | ICD-10-CM | POA: Diagnosis present

## 2017-08-09 DIAGNOSIS — R7689 Other specified abnormal immunological findings in serum: Secondary | ICD-10-CM | POA: Diagnosis present

## 2017-08-09 DIAGNOSIS — F19239 Other psychoactive substance dependence with withdrawal, unspecified: Secondary | ICD-10-CM | POA: Diagnosis not present

## 2017-08-09 DIAGNOSIS — R195 Other fecal abnormalities: Secondary | ICD-10-CM

## 2017-08-09 DIAGNOSIS — M264 Malocclusion, unspecified: Secondary | ICD-10-CM | POA: Diagnosis present

## 2017-08-09 DIAGNOSIS — J189 Pneumonia, unspecified organism: Secondary | ICD-10-CM

## 2017-08-09 DIAGNOSIS — R32 Unspecified urinary incontinence: Secondary | ICD-10-CM | POA: Diagnosis present

## 2017-08-09 DIAGNOSIS — Z8701 Personal history of pneumonia (recurrent): Secondary | ICD-10-CM

## 2017-08-09 DIAGNOSIS — Z9981 Dependence on supplemental oxygen: Secondary | ICD-10-CM

## 2017-08-09 DIAGNOSIS — A0472 Enterocolitis due to Clostridium difficile, not specified as recurrent: Secondary | ICD-10-CM

## 2017-08-09 DIAGNOSIS — X58XXXA Exposure to other specified factors, initial encounter: Secondary | ICD-10-CM | POA: Diagnosis present

## 2017-08-09 DIAGNOSIS — J181 Lobar pneumonia, unspecified organism: Secondary | ICD-10-CM | POA: Diagnosis present

## 2017-08-09 DIAGNOSIS — I340 Nonrheumatic mitral (valve) insufficiency: Secondary | ICD-10-CM | POA: Diagnosis present

## 2017-08-09 DIAGNOSIS — R768 Other specified abnormal immunological findings in serum: Secondary | ICD-10-CM

## 2017-08-09 DIAGNOSIS — A419 Sepsis, unspecified organism: Principal | ICD-10-CM

## 2017-08-09 DIAGNOSIS — K083 Retained dental root: Secondary | ICD-10-CM | POA: Diagnosis present

## 2017-08-09 DIAGNOSIS — I058 Other rheumatic mitral valve diseases: Secondary | ICD-10-CM | POA: Diagnosis present

## 2017-08-09 DIAGNOSIS — J90 Pleural effusion, not elsewhere classified: Secondary | ICD-10-CM

## 2017-08-09 DIAGNOSIS — F1410 Cocaine abuse, uncomplicated: Secondary | ICD-10-CM | POA: Diagnosis present

## 2017-08-09 DIAGNOSIS — I509 Heart failure, unspecified: Secondary | ICD-10-CM

## 2017-08-09 DIAGNOSIS — M899 Disorder of bone, unspecified: Secondary | ICD-10-CM | POA: Diagnosis present

## 2017-08-09 DIAGNOSIS — J918 Pleural effusion in other conditions classified elsewhere: Secondary | ICD-10-CM | POA: Diagnosis present

## 2017-08-09 DIAGNOSIS — I471 Supraventricular tachycardia: Secondary | ICD-10-CM | POA: Diagnosis not present

## 2017-08-09 DIAGNOSIS — E876 Hypokalemia: Secondary | ICD-10-CM | POA: Diagnosis present

## 2017-08-09 DIAGNOSIS — K06020 Generalized gingival recession, unspecified: Secondary | ICD-10-CM | POA: Diagnosis present

## 2017-08-09 DIAGNOSIS — E871 Hypo-osmolality and hyponatremia: Secondary | ICD-10-CM | POA: Diagnosis present

## 2017-08-09 DIAGNOSIS — I059 Rheumatic mitral valve disease, unspecified: Secondary | ICD-10-CM | POA: Diagnosis present

## 2017-08-09 DIAGNOSIS — K029 Dental caries, unspecified: Secondary | ICD-10-CM | POA: Diagnosis present

## 2017-08-09 DIAGNOSIS — Z8673 Personal history of transient ischemic attack (TIA), and cerebral infarction without residual deficits: Secondary | ICD-10-CM

## 2017-08-09 DIAGNOSIS — B182 Chronic viral hepatitis C: Secondary | ICD-10-CM | POA: Diagnosis present

## 2017-08-09 DIAGNOSIS — G8929 Other chronic pain: Secondary | ICD-10-CM | POA: Diagnosis present

## 2017-08-09 DIAGNOSIS — K045 Chronic apical periodontitis: Secondary | ICD-10-CM | POA: Diagnosis present

## 2017-08-09 DIAGNOSIS — F1110 Opioid abuse, uncomplicated: Secondary | ICD-10-CM | POA: Diagnosis present

## 2017-08-09 DIAGNOSIS — K089 Disorder of teeth and supporting structures, unspecified: Secondary | ICD-10-CM

## 2017-08-09 DIAGNOSIS — I2510 Atherosclerotic heart disease of native coronary artery without angina pectoris: Secondary | ICD-10-CM | POA: Diagnosis present

## 2017-08-09 DIAGNOSIS — I511 Rupture of chordae tendineae, not elsewhere classified: Secondary | ICD-10-CM | POA: Diagnosis present

## 2017-08-09 DIAGNOSIS — R0602 Shortness of breath: Secondary | ICD-10-CM | POA: Diagnosis present

## 2017-08-09 DIAGNOSIS — B9562 Methicillin resistant Staphylococcus aureus infection as the cause of diseases classified elsewhere: Secondary | ICD-10-CM | POA: Diagnosis present

## 2017-08-09 HISTORY — DX: Nonrheumatic mitral (valve) insufficiency: I34.0

## 2017-08-09 HISTORY — DX: Pleural effusion, not elsewhere classified: J90

## 2017-08-09 HISTORY — DX: Moderate protein-calorie malnutrition: E44.0

## 2017-08-09 HISTORY — DX: Other specified abnormal immunological findings in serum: R76.8

## 2017-08-09 LAB — CBC WITH DIFFERENTIAL/PLATELET
Basophils Absolute: 0 10*3/uL (ref 0.0–0.1)
Basophils Relative: 0 %
EOS ABS: 0.1 10*3/uL (ref 0.0–0.7)
EOS PCT: 1 %
HCT: 27.7 % — ABNORMAL LOW (ref 39.0–52.0)
Hemoglobin: 8.7 g/dL — ABNORMAL LOW (ref 13.0–17.0)
LYMPHS ABS: 1.4 10*3/uL (ref 0.7–4.0)
Lymphocytes Relative: 11 %
MCH: 29.2 pg (ref 26.0–34.0)
MCHC: 31.4 g/dL (ref 30.0–36.0)
MCV: 93 fL (ref 78.0–100.0)
Monocytes Absolute: 0.6 10*3/uL (ref 0.1–1.0)
Monocytes Relative: 5 %
Neutro Abs: 10.5 10*3/uL — ABNORMAL HIGH (ref 1.7–7.7)
Neutrophils Relative %: 83 %
PLATELETS: 290 10*3/uL (ref 150–400)
RBC: 2.98 MIL/uL — AB (ref 4.22–5.81)
RDW: 15.1 % (ref 11.5–15.5)
WBC: 12.5 10*3/uL — AB (ref 4.0–10.5)

## 2017-08-09 LAB — COMPREHENSIVE METABOLIC PANEL
ALT: 13 U/L — AB (ref 17–63)
AST: 19 U/L (ref 15–41)
Albumin: 2.2 g/dL — ABNORMAL LOW (ref 3.5–5.0)
Alkaline Phosphatase: 145 U/L — ABNORMAL HIGH (ref 38–126)
Anion gap: 11 (ref 5–15)
BILIRUBIN TOTAL: 0.7 mg/dL (ref 0.3–1.2)
BUN: 15 mg/dL (ref 6–20)
CALCIUM: 8.3 mg/dL — AB (ref 8.9–10.3)
CO2: 23 mmol/L (ref 22–32)
CREATININE: 1.02 mg/dL (ref 0.61–1.24)
Chloride: 102 mmol/L (ref 101–111)
GFR calc non Af Amer: >60 mL/min (ref >60)
Glucose, Bld: 118 mg/dL — ABNORMAL HIGH (ref 65–99)
Potassium: 4.1 mmol/L (ref 3.5–5.1)
Sodium: 136 mmol/L (ref 135–145)
Total Protein: 6.6 g/dL (ref 6.5–8.1)

## 2017-08-09 LAB — URINALYSIS, ROUTINE W REFLEX MICROSCOPIC
BACTERIA UA: NONE SEEN
Bilirubin Urine: NEGATIVE
Glucose, UA: NEGATIVE mg/dL
Ketones, ur: NEGATIVE mg/dL
Leukocytes, UA: NEGATIVE
Nitrite: NEGATIVE
PROTEIN: NEGATIVE mg/dL
SPECIFIC GRAVITY, URINE: 1.019 (ref 1.005–1.030)
pH: 5 (ref 5.0–8.0)

## 2017-08-09 LAB — RAPID URINE DRUG SCREEN, HOSP PERFORMED
Amphetamines: NOT DETECTED
BARBITURATES: POSITIVE — AB
Benzodiazepines: NOT DETECTED
COCAINE: NOT DETECTED
Opiates: POSITIVE — AB
TETRAHYDROCANNABINOL: NOT DETECTED

## 2017-08-09 LAB — D-DIMER, QUANTITATIVE (NOT AT ARMC): D DIMER QUANT: 4.67 ug{FEU}/mL — AB (ref 0.00–0.50)

## 2017-08-09 LAB — PROTIME-INR
INR: 1.15
PROTHROMBIN TIME: 14.6 s (ref 11.4–15.2)

## 2017-08-09 LAB — INFLUENZA PANEL BY PCR (TYPE A & B)
Influenza A By PCR: NEGATIVE
Influenza B By PCR: NEGATIVE

## 2017-08-09 LAB — I-STAT TROPONIN, ED: TROPONIN I, POC: 0 ng/mL (ref 0.00–0.08)

## 2017-08-09 LAB — I-STAT CG4 LACTIC ACID, ED
LACTIC ACID, VENOUS: 1.54 mmol/L (ref 0.5–1.9)
Lactic Acid, Venous: 1.9 mmol/L (ref 0.5–1.9)

## 2017-08-09 LAB — BRAIN NATRIURETIC PEPTIDE: B NATRIURETIC PEPTIDE 5: 449 pg/mL — AB (ref 0.0–100.0)

## 2017-08-09 MED ORDER — ALBUTEROL SULFATE (2.5 MG/3ML) 0.083% IN NEBU
5.0000 mg | INHALATION_SOLUTION | Freq: Four times a day (QID) | RESPIRATORY_TRACT | Status: DC
  Administered 2017-08-09: 5 mg via RESPIRATORY_TRACT

## 2017-08-09 MED ORDER — SODIUM CHLORIDE 0.9 % IV SOLN: 500.0000 mg | INTRAVENOUS | Status: DC

## 2017-08-09 MED ORDER — FENTANYL CITRATE (PF) 100 MCG/2ML IJ SOLN
50.0000 ug | Freq: Once | INTRAMUSCULAR | Status: AC
  Administered 2017-08-09: 50 ug via INTRAVENOUS
  Filled 2017-08-09: qty 2

## 2017-08-09 MED ORDER — SODIUM CHLORIDE 0.9 % IV SOLN: 1.0000 g | INTRAVENOUS | Status: DC

## 2017-08-09 MED ORDER — SODIUM CHLORIDE 0.9 % IV SOLN
500.0000 mg | Freq: Once | INTRAVENOUS | Status: DC
  Filled 2017-08-09: qty 500

## 2017-08-09 MED ORDER — IPRATROPIUM-ALBUTEROL 0.5-2.5 (3) MG/3ML IN SOLN
3.0000 mL | Freq: Four times a day (QID) | RESPIRATORY_TRACT | Status: DC
  Administered 2017-08-10 (×4): 3 mL via RESPIRATORY_TRACT
  Filled 2017-08-09 (×6): qty 3

## 2017-08-09 MED ORDER — IOPAMIDOL (ISOVUE-370) INJECTION 76%
INTRAVENOUS | Status: AC
  Administered 2017-08-09: 100 mL via INTRAVENOUS
  Filled 2017-08-09: qty 100

## 2017-08-09 MED ORDER — ACETAMINOPHEN 325 MG PO TABS
650.0000 mg | ORAL_TABLET | Freq: Once | ORAL | Status: AC
  Administered 2017-08-10: 650 mg via ORAL
  Filled 2017-08-09: qty 2

## 2017-08-09 MED ORDER — SODIUM CHLORIDE 0.9 % IV SOLN
1.0000 g | Freq: Once | INTRAVENOUS | Status: AC
  Administered 2017-08-09: 1 g via INTRAVENOUS
  Filled 2017-08-09: qty 1

## 2017-08-09 MED ORDER — ALBUTEROL SULFATE (2.5 MG/3ML) 0.083% IN NEBU: 2.5000 mg | INHALATION_SOLUTION | RESPIRATORY_TRACT | Status: DC | PRN

## 2017-08-09 MED ORDER — VANCOMYCIN HCL IN DEXTROSE 1-5 GM/200ML-% IV SOLN
1000.0000 mg | Freq: Once | INTRAVENOUS | Status: AC
  Administered 2017-08-09: 1000 mg via INTRAVENOUS
  Filled 2017-08-09: qty 200

## 2017-08-09 MED ORDER — ALBUTEROL SULFATE (2.5 MG/3ML) 0.083% IN NEBU
INHALATION_SOLUTION | RESPIRATORY_TRACT | Status: AC
  Filled 2017-08-09: qty 6

## 2017-08-09 MED ORDER — FUROSEMIDE 10 MG/ML IJ SOLN
20.0000 mg | Freq: Once | INTRAMUSCULAR | Status: AC
  Administered 2017-08-10: 20 mg via INTRAVENOUS

## 2017-08-09 MED ORDER — MORPHINE SULFATE (PF) 4 MG/ML IV SOLN
4.0000 mg | Freq: Once | INTRAVENOUS | Status: AC
  Administered 2017-08-09: 4 mg via INTRAVENOUS
  Filled 2017-08-09: qty 1

## 2017-08-09 MED ORDER — OXYCODONE HCL 5 MG PO TABS
5.0000 mg | ORAL_TABLET | Freq: Once | ORAL | Status: DC
  Filled 2017-08-09 (×2): qty 1

## 2017-08-09 MED ORDER — VANCOMYCIN HCL IN DEXTROSE 750-5 MG/150ML-% IV SOLN
750.0000 mg | Freq: Two times a day (BID) | INTRAVENOUS | Status: DC
  Filled 2017-08-09: qty 150

## 2017-08-09 MED ORDER — SODIUM CHLORIDE 0.9 % IV SOLN
1.0000 g | Freq: Once | INTRAVENOUS | Status: DC
  Filled 2017-08-09: qty 10
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
## 2017-08-09 NOTE — ED Notes (Signed)
Pt refusing another IV attempt w/o pain meds. PA made aware.

## 2017-08-09 NOTE — Progress Notes (Signed)
Rt called to assess pt for desat on 3lpm [place].  RN stated he has dropped sats several times and has needed more O2 assistance.  RT entered to find him difficult to complete sentences and noticeably working to breathe.  RT assessed BS to find little to no air moving.  RT made assessment and ordered pt to have Albuterol q6 at 5mg .  RT will continue to monitor.

## 2017-08-09 NOTE — ED Provider Notes (Signed)
Complains of progressively worsening shortness of breath for the past 2 days.  He denies any cough.  He does complain of midthoracic back pain slightly to the right of midline for several days since he fell in a nursing home.  He chronically suffers from lower back pain on exam he is alert, chronically ill-appearing.  Lungs tachypnea, heart tachycardic regular rhythm back without point tenderness or flank tenderness bilateral lower extremities with 1+ edema   Doug SouJacubowitz, Mckinlee Dunk, MD 08/09/17 2353

## 2017-08-09 NOTE — Progress Notes (Addendum)
Pharmacy Antibiotic Note  Marcus [name] is a 52 y.o. male admitted on 08/09/2017 with pneumonia.  Pharmacy has been consulted for ceftriaxone/azithromycin dosing. WBC up to 12.5 and patient afebrile.   Plan: Ceftriaxone 1 gm every 24 hours Azithromycin 500 mg every 24 hours Monitor clinical s/sx of infection  Pharmacy will sign off and monitor peripherally  Height: 5\' 11"  (180.3 cm) Weight: 150 lb (68 kg) IBW/kg (Calculated) : 75.3  Temp (24hrs), Avg:99 F (37.2 C), Min:99 F (37.2 C), Max:99 F (37.2 C)  Recent Labs  Lab 08/09/17 1353 08/09/17 1402  WBC 12.5*  --   CREATININE 1.02  --   LATICACIDVEN  --  1.90    Estimated Creatinine Clearance: 81.5 mL/min (by C-G formula based on SCr of 1.02 mg/dL).    No Known Allergies  Antimicrobials this admission: 3/9 Vanc> 3/9 Cefepime>>    Thank you for allowing pharmacy to be a part of this patient's care.  Sharin MonsEmily Ottis Vacha, PharmD, BCPS PGY2 Infectious Diseases Pharmacy Resident Pager: [phone]  08/09/2017 4:00 PM    Addendum: Patient now prescribed vancomycin  Vancomycin 1000 mg X 1 then 750 mg every 12 hours  Monitor renal function , clinical s/sx of infection   Sharin MonsEmily Kenyon Eichelberger, PharmD, BCPS PGY2 Infectious Diseases Pharmacy Resident Pager: [phone]

## 2017-08-09 NOTE — ED Triage Notes (Signed)
Pt brought in by [facility] from home for SOB x2 days. Pt diagnosed with bacterial meningitis at the beginning of January, then recently diagnosed with pneumonia. Pt also has hx of TIA beginning of January that left him with left sided weakness. Pt O2 88% on EMS arrival, then 96% following albuterol and 6L. Pt was in SNF, felt he was not receiving proper tx and decided to move back home by himself. Pt states since being home, SOB has gotten worse. Per EMS lung sounds diminished. Pt c/o chronic back pain at this time. A+Ox4. Denies neck stiffness.
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
## 2017-08-10 ENCOUNTER — Inpatient Hospital Stay (HOSPITAL_COMMUNITY): Payer: Medicaid Other

## 2017-08-10 DIAGNOSIS — I509 Heart failure, unspecified: Secondary | ICD-10-CM

## 2017-08-10 DIAGNOSIS — I330 Acute and subacute infective endocarditis: Secondary | ICD-10-CM

## 2017-08-10 DIAGNOSIS — R609 Edema, unspecified: Secondary | ICD-10-CM

## 2017-08-10 DIAGNOSIS — J90 Pleural effusion, not elsewhere classified: Secondary | ICD-10-CM

## 2017-08-10 DIAGNOSIS — B9561 Methicillin susceptible Staphylococcus aureus infection as the cause of diseases classified elsewhere: Secondary | ICD-10-CM

## 2017-08-10 DIAGNOSIS — I340 Nonrheumatic mitral (valve) insufficiency: Secondary | ICD-10-CM

## 2017-08-10 DIAGNOSIS — F1990 Other psychoactive substance use, unspecified, uncomplicated: Secondary | ICD-10-CM

## 2017-08-10 DIAGNOSIS — Z8661 Personal history of infections of the central nervous system: Secondary | ICD-10-CM

## 2017-08-10 DIAGNOSIS — Z87891 Personal history of nicotine dependence: Secondary | ICD-10-CM

## 2017-08-10 LAB — I-STAT ARTERIAL BLOOD GAS, ED
Acid-Base Excess: 2 mmol/L (ref 0.0–2.0)
Acid-base deficit: 1 mmol/L (ref 0.0–2.0)
Bicarbonate: 24.5 mmol/L (ref 20.0–28.0)
Bicarbonate: 27.7 mmol/L (ref 20.0–28.0)
O2 SAT: 82 %
O2 Saturation: 100 %
PCO2 ART: 44.2 mmHg (ref 32.0–48.0)
PO2 ART: 207 mmHg — AB (ref 83.0–108.0)
Patient temperature: 98.6
TCO2: 26 mmol/L (ref 22–32)
TCO2: 29 mmol/L (ref 22–32)
pCO2 arterial: 50 mmHg — ABNORMAL HIGH (ref 32.0–48.0)
pH, Arterial: 7.351 (ref 7.350–7.450)
pH, Arterial: 7.351 (ref 7.350–7.450)
pO2, Arterial: 49 mmHg — ABNORMAL LOW (ref 83.0–108.0)

## 2017-08-10 LAB — COMPREHENSIVE METABOLIC PANEL
ALT: 12 U/L — AB (ref 17–63)
AST: 17 U/L (ref 15–41)
Albumin: 2.1 g/dL — ABNORMAL LOW (ref 3.5–5.0)
Alkaline Phosphatase: 121 U/L (ref 38–126)
Anion gap: 10 (ref 5–15)
BILIRUBIN TOTAL: 0.7 mg/dL (ref 0.3–1.2)
BUN: 14 mg/dL (ref 6–20)
CALCIUM: 8.2 mg/dL — AB (ref 8.9–10.3)
CO2: 24 mmol/L (ref 22–32)
CREATININE: 1.05 mg/dL (ref 0.61–1.24)
Chloride: 101 mmol/L (ref 101–111)
GFR calc Af Amer: >60 mL/min (ref >60)
Glucose, Bld: 125 mg/dL — ABNORMAL HIGH (ref 65–99)
Potassium: 4 mmol/L (ref 3.5–5.1)
Sodium: 135 mmol/L (ref 135–145)
TOTAL PROTEIN: 6.2 g/dL — AB (ref 6.5–8.1)

## 2017-08-10 LAB — BODY FLUID CELL COUNT WITH DIFFERENTIAL
Eos, Fluid: 3 %
Lymphs, Fluid: 51 %
Monocyte-Macrophage-Serous Fluid: 21 % — ABNORMAL LOW (ref 50–90)
Neutrophil Count, Fluid: 25 % (ref 0–25)
Total Nucleated Cell Count, Fluid: 273 cu mm (ref 0–1000)

## 2017-08-10 LAB — PROCALCITONIN
Procalcitonin: 0.34 ng/mL
Procalcitonin: 0.43 ng/mL

## 2017-08-10 LAB — PROTIME-INR
INR: 1.2
PROTHROMBIN TIME: 15.1 s (ref 11.4–15.2)

## 2017-08-10 LAB — RESPIRATORY PANEL BY PCR
Adenovirus: NOT DETECTED
BORDETELLA PERTUSSIS-RVPCR: NOT DETECTED
Chlamydophila pneumoniae: NOT DETECTED
Coronavirus 229E: NOT DETECTED
Coronavirus HKU1: NOT DETECTED
Coronavirus NL63: NOT DETECTED
Coronavirus OC43: NOT DETECTED
INFLUENZA A-RVPPCR: NOT DETECTED
INFLUENZA B-RVPPCR: NOT DETECTED
METAPNEUMOVIRUS-RVPPCR: NOT DETECTED
Mycoplasma pneumoniae: NOT DETECTED
PARAINFLUENZA VIRUS 2-RVPPCR: NOT DETECTED
PARAINFLUENZA VIRUS 3-RVPPCR: NOT DETECTED
PARAINFLUENZA VIRUS 4-RVPPCR: NOT DETECTED
Parainfluenza Virus 1: NOT DETECTED
RESPIRATORY SYNCYTIAL VIRUS-RVPPCR: NOT DETECTED
Rhinovirus / Enterovirus: NOT DETECTED

## 2017-08-10 LAB — STREP PNEUMONIAE URINARY ANTIGEN: Strep Pneumo Urinary Antigen: NEGATIVE

## 2017-08-10 LAB — HIV ANTIBODY (ROUTINE TESTING W REFLEX): HIV SCREEN 4TH GENERATION: NONREACTIVE

## 2017-08-10 LAB — ECHOCARDIOGRAM COMPLETE
Height: 71 in
WEIGHTICAEL: 2497.37 [oz_av]

## 2017-08-10 LAB — GRAM STAIN

## 2017-08-10 LAB — LACTATE DEHYDROGENASE, PLEURAL OR PERITONEAL FLUID: LD FL: 120 U/L — AB (ref 3–23)

## 2017-08-10 LAB — AMYLASE, PLEURAL OR PERITONEAL FLUID: AMYLASE FL: 44 U/L

## 2017-08-10 LAB — CBC
HEMATOCRIT: 26.5 % — AB (ref 39.0–52.0)
Hemoglobin: 8.3 g/dL — ABNORMAL LOW (ref 13.0–17.0)
MCH: 29.2 pg (ref 26.0–34.0)
MCHC: 31.3 g/dL (ref 30.0–36.0)
MCV: 93.3 fL (ref 78.0–100.0)
Platelets: 266 10*3/uL (ref 150–400)
RBC: 2.84 MIL/uL — ABNORMAL LOW (ref 4.22–5.81)
RDW: 15.6 % — AB (ref 11.5–15.5)
WBC: 12.2 10*3/uL — ABNORMAL HIGH (ref 4.0–10.5)

## 2017-08-10 LAB — URINE CULTURE

## 2017-08-10 LAB — LACTATE DEHYDROGENASE: LDH: 181 U/L (ref 98–192)

## 2017-08-10 LAB — GLUCOSE, PLEURAL OR PERITONEAL FLUID: Glucose, Fluid: 115 mg/dL

## 2017-08-10 LAB — TROPONIN I
Troponin I: <0.03 ng/mL (ref <0.03)
Troponin I: <0.03 ng/mL (ref <0.03)

## 2017-08-10 LAB — ETHANOL: Alcohol, Ethyl (B): <10 mg/dL (ref <10)

## 2017-08-10 MED ORDER — VANCOMYCIN HCL IN DEXTROSE 750-5 MG/150ML-% IV SOLN
750.0000 mg | Freq: Three times a day (TID) | INTRAVENOUS | Status: DC
  Administered 2017-08-10: 750 mg via INTRAVENOUS
  Filled 2017-08-10 (×3): qty 150

## 2017-08-10 MED ORDER — FOLIC ACID 1 MG PO TABS: 1.0000 mg | ORAL_TABLET | Freq: Every day | ORAL | Status: DC

## 2017-08-10 MED ORDER — VITAMIN B-1 100 MG PO TABS: 100.0000 mg | ORAL_TABLET | Freq: Every day | ORAL | Status: DC

## 2017-08-10 MED ORDER — LIDOCAINE HCL (PF) 1 % IJ SOLN
INTRAMUSCULAR | Status: AC
  Filled 2017-08-10: qty 30

## 2017-08-10 MED ORDER — FUROSEMIDE 10 MG/ML IJ SOLN
40.0000 mg | Freq: Once | INTRAMUSCULAR | Status: AC
  Administered 2017-08-10: 40 mg via INTRAVENOUS
  Filled 2017-08-10: qty 4

## 2017-08-10 MED ORDER — ADULT MULTIVITAMIN W/MINERALS CH: 1.0000 | ORAL_TABLET | Freq: Every day | ORAL | Status: DC

## 2017-08-10 MED ORDER — FUROSEMIDE 10 MG/ML IJ SOLN
40.0000 mg | Freq: Two times a day (BID) | INTRAMUSCULAR | Status: DC
  Administered 2017-08-10 – 2017-08-12 (×4): 40 mg via INTRAVENOUS
  Filled 2017-08-10 (×4): qty 4

## 2017-08-10 MED ORDER — LORAZEPAM 2 MG/ML IJ SOLN
2.0000 mg | INTRAMUSCULAR | Status: DC | PRN
  Administered 2017-08-10 (×5): 2 mg via INTRAVENOUS
  Administered 2017-08-11: 3 mg via INTRAVENOUS
  Filled 2017-08-10 (×5): qty 1
  Filled 2017-08-10: qty 2
  Filled 2017-08-10: qty 1

## 2017-08-10 MED ORDER — SODIUM CHLORIDE 0.9 % IV SOLN
1.0000 g | Freq: Three times a day (TID) | INTRAVENOUS | Status: DC
  Administered 2017-08-10 – 2017-08-11 (×4): 1 g via INTRAVENOUS
  Filled 2017-08-10 (×6): qty 1

## 2017-08-10 MED ORDER — VANCOMYCIN HCL IN DEXTROSE 750-5 MG/150ML-% IV SOLN
750.0000 mg | Freq: Once | INTRAVENOUS | Status: AC
  Administered 2017-08-10: 750 mg via INTRAVENOUS
  Filled 2017-08-10: qty 150

## 2017-08-10 MED ORDER — FENTANYL CITRATE (PF) 100 MCG/2ML IJ SOLN
50.0000 ug | INTRAMUSCULAR | Status: DC | PRN
  Administered 2017-08-10 – 2017-08-11 (×2): 50 ug via INTRAVENOUS
  Filled 2017-08-10 (×3): qty 2

## 2017-08-10 MED ORDER — ENOXAPARIN SODIUM 40 MG/0.4ML ~~LOC~~ SOLN: 40.0000 mg | SUBCUTANEOUS | Status: DC

## 2017-08-10 NOTE — Progress Notes (Signed)
Transported pt with RN at bedside on [id number] to [phone]

## 2017-08-10 NOTE — ED Notes (Signed)
Family medicine returned page. OK to eat

## 2017-08-10 NOTE — ED Notes (Signed)
CCM at bedside. States pt is cleared for stepdown unit.

## 2017-08-10 NOTE — ED Notes (Signed)
Repositioned pt to reclining chair for comfort.

## 2017-08-10 NOTE — Progress Notes (Signed)
Family Medicine Teaching Service Daily Progress Note Intern Pager: [phone]  Patient name: Marcus [name] Medical record number: [id number] Date of birth: [birth year] Age: 52 y.o. Gender: male  Primary Care Provider: Patient, No Pcp Per Consultants: PCCM, ID Code Status: Full  Pt Overview and Major Events to Date:  3/09: Admit for sepsis thought to be secondary to HAP and new onset HF, cultures obtained, broad-spectrum antibiotics initiated 3/10: PCCM consulted, tolerating BiPAP  Assessment and Plan: Marcus [name] is a 52 y.o. male presenting with worsening SOB. 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   Acute hypoxic hypercarbic respiratory failure  Septic RLL HAP with pleural effusion: Acute.  Does have signs of pneumonia on CXR particularly at right base with associated pleural effusion.  Given recent hospitalization, meets criteria for healthcare associated pneumonia.  Currently tolerating BiPAP though patient continues to have tachypnea and has been pulling on mask.  Critical care consulted and following.  Patient may ultimately need to be intubated.  Currently on broad-spectrum antibiotics awaiting cultures.  Lactic acidosis resolved. - PCCM consulted, appreciate recommendations - ID consulted, appreciate recommendations - Continue BiPAP with O2 goal greater than 88%, monitor ABGs - Diagnostic and therapeutic ultrasound thoracocentesis with pleural aspiration pending  - Repeat CXR pending - Continue vancomycin and cefepime per pharmacy, pending blood cultures and Legionella and strep pneumo antigen, trending pro calcitonin - Droplet precautions, awaiting RVP  Chest pain  Endocarditis of mitral valve  h/o embolic stroke and metabolic encephalopathy  new onset heart failure: Recently diagnosed with mitral valve endocarditis in January 2019 with methicillin sensitive staph aureus bacteremia and septic  left knee arthritis status post aspiration.  EF 60-65% without wall motion abnormality or diastolic dysfunction.  Patient does have signs concerning for new onset heart failure given elevated BNP and clinical appearance.  Difficult to assess complete neuro exam given confused state though no signs of focal deficit. - Complete 2D echocardiogram - Repeat EKG 12-lead and trend troponins x3 given complaint of chest pain - Daily weights  Left knee edema  h/o left MSSA septic arthritis: Presented with lower extremity edema.  Does have recent septic arthritis of the knee.  Will need to rule out DVT given high risk with endocarditis. - Lower left extremity Doppler pending  Hepatitis C genotype 2B: He notes during previous hospitalization.  No prior treatment.  Recent viral load 224K.  LFTs and bilirubin reassuring on presentation.  Albumin 2.2. - ID consulted, appreciate recommendations  Compression deformities of mid to lower thoracic spine: She reports history of fall back in January 2019 he continues to have persistent low back pain since discharge.  Findings of uncertain age mid to lower thoracic spine compression deformities on CXR.  Patient reports use of OxyContin and oxycodone for pain control though no medications per chart review. - Fentanyl 50 mcg every 4 hours as needed, continue monitoring pain control - PT consult pending  Polysubstance abuse: UDS positive for barbiturates and opiates on presentation.  Also has history of cocaine back in January 2019.  Has known endocarditis likely from IV drug use though patient reports no recent illicit drug use.  Ethanol level within normal limits on presentation.  Currently on CIWA and COWS. - Continue CIWA protocol - Daily folate and thiamine  FEN/GI: NPO while on BiPAP PPx: Awaiting Doppler for LLL concerning for possible DVT, holding SCDs, planned thoracocentesis  Disposition: Will await improvement of acute respiratory failure  while on BiPAP,  prognosis is guarded and may need intubation if he continues to deteriorate.  Subjective:  History difficult to obtain given altered mental state after receiving Ativan for agitation.  Able to acknowledge me in the room when asked and endorsing some chest pain with shortness of breath.    Objective: Temp:  [97.9 F (36.6 C)-100.2 F (37.9 C)] 97.9 F (36.6 C) (03/10 0930) Pulse Rate:  [99-117] 111 (03/10 0857) Resp:  [17-31] 24 (03/10 0800) BP: (92-135)/(52-105) 129/99 (03/10 0930) SpO2:  [84 %-100 %] 100 % (03/10 0900) FiO2 (%):  [6 %-100 %] 100 % (03/10 0740) Weight:  [150 lb (68 kg)-156 lb 1.4 oz (70.8 kg)] 156 lb 1.4 oz (70.8 kg) (03/10 0930) Physical Exam: General: ill-appearing male with confused state HEENT: normocephalic, atraumatic, moist mucous membranes, no JVD, PERRLA, EOMI Neck: supple, non-tender without lymphadenopathy Cardiovascular: tachycardic with regular rhythm with harsh pansystolic murmur more sent mitral valve without rubs, or gallops Lungs: tachypneic on BiPAP using abdominal muscles, moderately diminished breath sounds in bases bilaterally without rhonchi or rales Abdomen: soft, non-tender, non-distended, normoactive bowel sounds Skin: warm, dry, no rashes or lesions, cap refill < 2 seconds Extremities: warm and well perfused, normal tone, slight edema on left knee without erythema or tenderness 2+ left ankle edema without ecchymoses or petechiae Neuro: grossly moving all 4 extremities, awake and alert  Laboratory: Recent Labs  Lab 08/09/17 1353 08/10/17 0449  WBC 12.5* 12.2*  HGB 8.7* 8.3*  HCT 27.7* 26.5*  PLT 290 266   Recent Labs  Lab 08/09/17 1353 08/10/17 0449  NA 136 135  K 4.1 4.0  CL 102 101  CO2 23 24  BUN 15 14  CREATININE 1.02 1.05  CALCIUM 8.3* 8.2*  PROT 6.6 6.2*  BILITOT 0.7 0.7  ALKPHOS 145* 121  ALT 13* 12*  AST 19 17  GLUCOSE 118* 125*   3/10 i-STAT ABG: PH 7.351, PCO2 44.2, PO2 49, bicarb 24.5, O2 sat 82% 3/10 i-STAT  ABG (repeat): PH 7.351, PCO2 50, PO2 207, bicarb 27.7, O2 sat 100% 3/10 blood cultures x2: Pending 3/10 lactic dehydrogenase: Pending 3/10 HIV: Pending 3/10 pro-calcitonin: 0.34>0.43 3/10 INR: 1.20 3/10 ethanol: Negative 3/10 strep pneumo urine antigen: Pending 3/10 Legionella urine antigen: Pending 3/10 sputum culture and Gram stain: Pending 3/09 INR: 1.15 3/09 i-STAT lactic acid: 1.90> 1.54 3/09 blood culture x2: Pending 3/09 urine culture: Pending 3/09 influenza: Negative 3/09 respiratory virus panel: Pending 3/09 UA: Large hemoglobin, RBC too numerous to count, mucus present 3/09 UDS: Positive opiates and barbiturates 3/09 d-dimer: 4.67 3/09 BNP: 449 3/09: I-STAT troponin: 0  Imaging/Diagnostic Tests: CT Angio Chest PE W and/or Wo Contrast (08/09/17) IMPRESSION: 1. Patchy areas of consolidation in both lungs consistent with multifocal pneumonia. 2. Complete atelectasis of the right middle lobe and near complete atelectasis of the right lower lobe. Mild dependent atelectasis in the left lower lobe. 3. Large right and small left pleural effusions. 4. Marked loss of the disc space at T6-C7 with irregular resorption of endplates leading to loss of vertebral body height of T6 and T7 and a focal kyphosis. This may reflect the sequelae of an old fracture. It could be due to previous discitis/osteomyelitis.  DG Chest 2 View (08/09/17) IMPRESSION: 1. New dense opacity at the right lung base, likely a combination of pneumonia or atelectasis and pleural effusion. The pleural effusion component is at least moderate in size. Would consider chest CT for further characterization. 2. Additional patchy ill-defined opacities within  the left mid and lower lung, most likely edema, pneumonia considered less likely. 3. Probable mild atelectasis and/or small pleural effusion at the left lung base. ADDENDUM: After further review, there are compression deformities of 2 adjacent vertebral bodies in the mid  to lower thoracic spine, of uncertain age but new compared to an older chest x-ray 02/15/2005. Would consider further characterization with CT.    Wendee BeaversMcMullen, Cynethia Schindler J, DO 08/10/2017, 9:53 AM PGY-2, [facility] FPTS Intern pager: [phone], text pages welcome

## 2017-08-10 NOTE — ED Notes (Signed)
Pt saturating on 99% on nasal cannula but unable to keep on face, placed on nonrebreather

## 2017-08-10 NOTE — ED Notes (Addendum)
Pt having difficulty breathing. Pt tripoding since this RN arrival at 0700. Pt belly breathing and grunting. Pt continues to desat to the upper 60's with any movement. Pt with little to no air movement in bil lung bases. Pt anxious and agitated. Disoriented X4. Respiratory remains at the bedside. ED Dr reassessed pt. Internal Medicine now at the bedside.

## 2017-08-10 NOTE — Consult Note (Signed)
[facility]       Reason for Consult: endocarditis    Referring Physician: Dr. Jennette Kettle  Active Problems:   Sepsis (HCC)   Hepatitis C antibody positive in blood   Bacterial endocarditis   . folic acid  1 mg Oral Daily  . ipratropium-albuterol  3 mL Nebulization Q6H  . lidocaine (PF)      . multivitamin with minerals  1 tablet Oral Daily  . thiamine  100 mg Oral Daily    Recommendations: D/c vancomycin Agree with RVP  continue cefepime for now pending pulmonary studies, cultures Cardiology consultation for worsening valve disfunction  Assessment: He has endocarditis with MSSA, penicillin sensitive s/p treatment.  TTE notes residual vegetation.  At this time, the patient is not easily arousable so I am not able to ask if he completed treatment or not.  This may represent sterile vegetation post treatment.   Will monitor blood cultures, continue with cefepime.   Antibiotics: Vancomycin and cefepime  HPI: Marcus [name] is a 52 y.o. male with IVDU admitted in January with MSSA mitral valve endocarditis complicated by septic cerebral emboli.  He was to be treated with 6 weeks of IV ampicillin at a SNF and it is unclear at this time if he completed treatment or not.  He did not return for follow up with [place] and comes in now with sob.  On TTE he has severe mitral regurgitation and pleural effusions now s/p thoracentesis.  Procalcitonin is < 0.5 more c/w non infectious process.  WBC in pleural fluid 273.    Review of Systems:  Unable to be assessed due to mental status   Past Medical History:  Diagnosis Date  . Acute kidney failure (HCC) 06/2017   hx/notes 06/13/2017  . Arthritis    "all my joints ache; at atll times" (06/13/2017)  . Bacteremia due to methicillin resistant Staphylococcus aureus    Hattie Perch 06/13/2017  . Chronic lower back pain   . Endocarditis of mitral valve    MSSA mitral valve endocarditis/notes 06/13/2017  . Hepatitis C    hx/notes  06/13/2017  . Meningitis due to bacteria    Hattie Perch 06/12/2017  . Polysubstance abuse (HCC)    hx/notes 06/13/2017  . Septic arthritis of knee, left (HCC) 06/2017   Hattie Perch 06/13/2017  . Septic embolism (HCC) 06/2017   Hattie Perch 06/12/2017  . Thrombocytopenia (HCC)    hx/notes 06/13/2017    Social History   Tobacco Use  . Smoking status: Former Smoker    Packs/day: 1.00    Years: 10.00    Pack years: 10.00    Types: Cigarettes    Last attempt to quit: 11/04/2015    Years since quitting: 1.7  . Smokeless tobacco: Never Used  Substance Use Topics  . Alcohol use: Yes    Comment: occasionally   . Drug use: No  per record  No family history on file. unable to obtain due to mental status  No Known Allergies  Physical Exam: Constitutional: minimally arousable on facemask Vitals:   08/10/17 1200 08/10/17 1245  BP: (!) 112/97 100/78  Pulse:    Resp:    Temp:    SpO2:     EYES: anicteric ENMT: Cardiovascular: Cor RRR Respiratory: distant but CTA B; respiratory effort on facemask GI: Bowel sounds are normal, liver is not enlarged, spleen is not enlarged Musculoskeletal: no pedal edema noted Skin: negatives: no rash Hematologic: no cervical lad  Lab Results  Component Value  Date   WBC 12.2 (H) 08/10/2017   HGB 8.3 (L) 08/10/2017   HCT 26.5 (L) 08/10/2017   MCV 93.3 08/10/2017   PLT 266 08/10/2017    Lab Results  Component Value Date   CREATININE 1.05 08/10/2017   BUN 14 08/10/2017   NA 135 08/10/2017   K 4.0 08/10/2017   CL 101 08/10/2017   CO2 24 08/10/2017    Lab Results  Component Value Date   ALT 12 (L) 08/10/2017   AST 17 08/10/2017   ALKPHOS 121 08/10/2017     Microbiology: Recent Results (from the past 240 hour(s))  Urine culture     Status: Abnormal   Collection Time: 08/09/17  3:50 PM  Result Value Ref Range Status   Specimen Description URINE, CLEAN CATCH  Final   Special Requests   Final    NONE Performed at [facility], [address] [place], [place] [address]    Culture MULTIPLE SPECIES PRESENT, SUGGEST RECOLLECTION (A)  Final   Report Status 08/10/2017 FINAL  Final  Culture, blood (routine x 2) Call MD if unable to obtain prior to antibiotics being given     Status: None (Preliminary result)   Collection Time: 08/10/17  1:10 AM  Result Value Ref Range Status   Specimen Description   Final    BLOOD RIGHT HAND Performed at [facility], [address] [place], [place] [address]    Special Requests IN PEDIATRIC BOTTLE Blood Culture adequate volume  Final   Culture PENDING  Incomplete   Report Status PENDING  Incomplete  Culture, blood (routine x 2) Call MD if unable to obtain prior to antibiotics being given     Status: None (Preliminary result)   Collection Time: 08/10/17  1:20 AM  Result Value Ref Range Status   Specimen Description   Final    BLOOD RIGHT ARM Performed at [facility], [address] [place], [place] [address]    Special Requests   Final    BOTTLES DRAWN AEROBIC AND ANAEROBIC Blood Culture adequate volume   Culture PENDING  Incomplete   Report Status PENDING  Incomplete    Gardiner Barefootobert W Comer, MD [facility] [email or web address] [phone] pager  [phone] cell 08/10/2017, 2:06 PM

## 2017-08-10 NOTE — ED Notes (Signed)
Currently no meds ordered to treat COWS/CIAWA; paged on-call attending

## 2017-08-10 NOTE — Progress Notes (Signed)
Bilateral lower extremity venous duplex has been completed. Negative for obvious evidence of DVT.  08/10/17 2:28 PM Olen CordialGreg Laken Lobato RVT

## 2017-08-10 NOTE — Progress Notes (Signed)
FPTS Interim Progress Note  S: Unable to obtain given acuity of condition.  O: BP (!) 129/91   Pulse (!) 102   Temp 97.9 F (36.6 C) (Oral)   Resp (!) 28   Ht 5\' 11"  (1.803 m)   Wt 156 lb 1.4 oz (70.8 kg)   SpO2 96%   BMI 21.77 kg/m     A/P: Patient continues to remain on BiPAP with successful wean to 35% with tachypnea at 30.  Continues to have increased work of breathing and response to pain with sternal rub.  Has been receiving Ativan for agitation with pulling of mask.  Pansystolic murmur present consistent with mitral valve endocarditis.  Good breath sounds bilaterally without focal findings.  Legs are nonedematous.  Lower extremity Doppler negative for DVT.  There was 1.4 L pink tinged fluid removed via thoracocentesis.  Hopeful this will improve pulmonary function.  Patient does have signs of worsening mitral valve endocarditis with moderate dilation of the left atrium and elevated BNP on admission.  Cardiology plans to perform TEE with improvement of respiratory status.  Continuing vancomycin per ID recs.  Will continue to monitor closely.  PCCM aware of patient's status.  Wendee BeaversMcMullen, Jerita Wimbush J, DO 08/10/2017, Jolaine Click3:24 PM PGY-2, [place] Service pager: [phone]

## 2017-08-10 NOTE — ED Notes (Signed)
Pt placed on BIPAP

## 2017-08-10 NOTE — Consult Note (Signed)
Name:Billiter, Apolinar Junes Male, 52 y.o., [birth year]   Chart reviewed, patient seen and examined. Consultation called for dyspnea, acute hypoxic respiratory failure and to evaluate for possible need for intubation.  Chief Complaint  Patient presents with  . 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  Patient is a poor historian and confused at this time.  No history is obtainable from the patient.  Information is obtained from the chart. No family at the bedside.  In the emergency room patient got vancomycin and cefepime.  He was placed on BiPAP for hypoxia with subsequent improvement in oxygenation.  CT pulmonary angiogram protocol CT scan excluded PE. He had a large right pleural effusion and small left pleural effusion.  There was evidence of multifocal pneumonia as well as complete atelectasis of the right lower lobe and partially of the right middle lobe.  After being on BiPAP, there was good improvement in oxygenation.  He seems  to be tolerating BiPAP at this time.  It is not clear to me about the duration of antibiotics for endocarditis.       has a past medical history of Acute kidney failure (HCC) (06/2017), Arthritis, Bacteremia due to methicillin resistant Staphylococcus aureus, Chronic lower back pain, Endocarditis of mitral valve, Hepatitis C, Meningitis due to bacteria, Polysubstance abuse (HCC), Septic arthritis of knee, left (HCC) (06/2017), Septic embolism (HCC) (06/2017), and Thrombocytopenia (HCC).   has a past surgical history that includes Ankle surgery (Left); Hip fracture surgery (Left, 1994); Irrigation and debridement knee (Left, 06/11/2017); Tonsillectomy; and  Fracture surgery.  Family History: unobtainable due to patient's altered mental status  Social History:unobtainable due to patient's altered mental status  There is evidence of polysubstance abuse including cocaine, barbiturates, opiates.   Home Medications: Current Meds  Medication Sig  . polyethylene glycol (MIRALAX / GLYCOLAX) packet Take 17 g by mouth daily as needed for mild constipation.  Marland Kitchen PRESCRIPTION MEDICATION Take thyroid medication but do not know the name    Inpatient Medications:  Current Facility-Administered Medications:  .  albuterol (PROVENTIL) (2.5 MG/3ML) 0.083% nebulizer solution 2.5 mg, 2.5 mg, Nebulization, Q2H PRN, Darin Engels, Sherin, DO .  albuterol (PROVENTIL) (2.5 MG/3ML) 0.083% nebulizer solution, , , ,  .  ceFEPIme (MAXIPIME) 1 g in sodium chloride 0.9 % 100 mL IVPB, 1 g, Intravenous, Q8H, Abraham, Sherin, DO .  enoxaparin (LOVENOX) injection 40 mg, 40 mg, Subcutaneous, Q24H, Abraham, Sherin, DO .  fentaNYL (SUBLIMAZE) injection 50 mcg, 50 mcg, Intravenous, Q4H PRN, Oralia Manis, DO, 50 mcg at 08/10/17 0110 .  folic acid (FOLVITE) tablet 1 mg, 1 mg, Oral, Daily, Abraham, Sherin, DO .  ipratropium-albuterol (DUONEB) 0.5-2.5 (3) MG/3ML nebulizer solution 3 mL, 3 mL, Nebulization, Q6H, Abraham, Sherin, DO, 3 mL at 08/10/17 0718 .  LORazepam (ATIVAN) injection 2-3 mg, 2-3 mg, Intravenous, Q1H PRN, Oralia Manis, DO, 2 mg at 08/10/17 0903 .  multivitamin with minerals tablet 1 tablet, 1 tablet, Oral, Daily, Darin Engels, Sherin, DO .  thiamine (VITAMIN B-1) tablet 100 mg, 100 mg, Oral, Daily, Darin Engels, Sherin, DO .  vancomycin (VANCOCIN) IVPB 750 mg/150 ml premix, 750 mg, Intravenous, Q8H, Juliette Mangle, RPH, Last Rate: 150 mL/hr at 08/10/17 0826, 750 mg at 08/10/17 [id number]  Review of Systems  Unable to perform ROS: Mental status change     Physical Exam: Blood pressure 108/64, pulse (!) 116, temperature 100.1 F (37.8 C), temperature source Rectal, resp.  rate (!) 24, height 5\' 11"  (1.803 m), weight 150 lb (68 kg), SpO2 100 %.  General appearance  Ill-appearing male, not in acute distress  HEENT No subconjunctival hemorrhage No pallor/icterus No nasal congestion or drainage No tongue swelling No swelling in the neck  No neck vein distention    Cardiovascular S1 and S2 regular, tachycardic, Harsh pansystolic murmur in the mitral area Murmur also heard in the parasternal and the aortic areas    Respiratory Chest expansion equal but diminished Breath sounds diminished in bases; dull to percussion- right base No rhonchi or rales   Abdomen   Soft,Nontender,  Slightly distended No hepatoslpenomegaly ,No mass Bowel sounds+   Extremities Left knee swollen; no erythema, tenderness No  Calf swelling/no tenderness tenderness No clubbing Capillary refill < 2 sec Peripheral pulses DP 2+ Left ankle edema 2 + No ecchymosis, petechiae, subungual hemorrhage  Neurological Alert, awake,confused Pupils equal and reacting No facial asymmetry Tone normal Power 5/5  Labs Results for Mikeal HawthorneLOWE, BYRON T (MRN [id number]) as of 08/10/2017 09:03  Ref. Range 08/10/2017 07:35 08/10/2017 08:35  pH, Arterial Latest Ref Range: 7.350 - 7.450  7.351 7.351  pCO2 arterial Latest Ref Range: 32.0 - 48.0 mmHg 44.2 50.0 (H)  pO2, Arterial Latest Ref Range: 83.0 - 108.0 mmHg 49.0 (L) 207.0 (H)  TCO2 Latest Ref Range: 22 - 32 mmol/L 26 29   Results for Mikeal HawthorneLOWE, BYRON T (MRN [id number]) as of 08/10/2017 09:03  Ref. Range 08/10/2017 04:49  Sodium Latest Ref Range: 135 - 145 mmol/L 135  Potassium Latest Ref Range: 3.5 - 5.1 mmol/L 4.0  Chloride Latest Ref Range: 101 - 111 mmol/L 101  CO2 Latest Ref Range: 22 - 32 mmol/L 24  Glucose Latest Ref Range: 65 - 99 mg/dL 295125 (H)  BUN Latest Ref Range: 6 - 20 mg/dL 14  Creatinine Latest Ref Range: 0.61 - 1.24 mg/dL 2.841.05  Calcium Latest Ref Range: 8.9 - 10.3 mg/dL 8.2 (L)  Anion gap Latest Ref Range: 5 - 15  10  Alkaline  Phosphatase Latest Ref Range: 38 - 126 U/L 121  Albumin Latest Ref Range: 3.5 - 5.0 g/dL 2.1 (L)  AST Latest Ref Range: 15 - 41 U/L 17  ALT Latest Ref Range: 17 - 63 U/L 12 (L)  Total Protein Latest Ref Range: 6.5 - 8.1 g/dL 6.2 (L)  Total Bilirubin Latest Ref Range: 0.3 - 1.2 mg/dL 0.7  GFR, Est Non African American Latest Ref Range: >60 mL/min >60  GFR, Est African American Latest Ref Range: >60 mL/min >60   Results for Mikeal HawthorneLOWE, BYRON T (MRN [id number]) as of 08/10/2017 09:03  Ref. Range 08/10/2017 04:49  Procalcitonin Latest Units: ng/mL 0.43  WBC Latest Ref Range: 4.0 - 10.5 K/uL 12.2 (H)  RBC Latest Ref Range: 4.22 - 5.81 MIL/uL 2.84 (L)  Hemoglobin Latest Ref Range: 13.0 - 17.0 g/dL 8.3 (L)  HCT Latest Ref Range: 39.0 - 52.0 % 26.5 (L)  MCV Latest Ref Range: 78.0 - 100.0 fL 93.3  MCH Latest Ref Range: 26.0 - 34.0 pg 29.2  MCHC Latest Ref Range: 30.0 - 36.0 g/dL 72.531.3  RDW Latest Ref Range: 11.5 - 15.5 % 15.6 (H)  Platelets Latest Ref Range: 150 - 400 K/uL 266  Prothrombin Time Latest Ref Range: 11.4 - 15.2 seconds 15.1  INR Unknown 1.20  Glucose Latest Ref Range: 65 - 99 mg/dL 161 (H)    Results for ERVINE, WITUCKI (MRN [id number]) as of 08/10/2017 09:03  Ref. Range 08/10/2017 00:43  Alcohol, Ethyl (B) Latest Ref Range: <10 mg/dL <81   Results for UTAH, DELAUDER (MRN [id number]) as of 08/10/2017 09:03  Ref. Range 06/08/2017 17:24 08/09/2017 16:00 08/10/2017 00:43  Alcohol, Ethyl (B) Latest Ref Range: <10 mg/dL <62  <13  Salicylate Lvl Latest Ref Range: 2.8 - 30.0 mg/dL <0.8    Amphetamines Latest Ref Range: NONE DETECTED  NONE DETECTED NONE DETECTED   Barbiturates Latest Ref Range: NONE DETECTED  NONE DETECTED POSITIVE (A)   Benzodiazepines Latest Ref Range: NONE DETECTED  POSITIVE (A) NONE DETECTED   Opiates Latest Ref Range: NONE DETECTED  POSITIVE (A) POSITIVE (A)   COCAINE Latest Ref Range: NONE DETECTED  POSITIVE (A) NONE DETECTED   Tetrahydrocannabinol Latest Ref Range: NONE DETECTED   NONE DETECTED NONE DETECTED    Results for MAYCEN, DEGREGORY (MRN [id number]) as of 08/10/2017 09:03  Ref. Range 06/08/2017 22:58 06/08/2017 22:58 06/10/2017 13:13  Appearance, CSF Latest Ref Range: CLEAR  HAZY (A) HAZY (A)   Glucose, CSF Latest Ref Range: 40 - 70 mg/dL 59    RBC Count, CSF Latest Ref Range: 0 /cu mm 1 (H) 8 (H)   Segmented Neutrophils-CSF Latest Ref Range: 0 - 6 % 85 (H) 90 (H)   Lymphs, CSF Latest Ref Range: 40 - 80 % 3 (L) 4 (L)   Monocyte-Macrophage-Spinal Fluid Latest Ref Range: 15 - 45 % 12 (L) 6 (L)   Eosinophils, CSF Latest Ref Range: 0 - 1 % 0 0   Color, CSF Latest Ref Range: COLORLESS  COLORLESS COLORLESS   Supernatant Unknown NOT INDICATED NOT INDICATED   Total  Protein, CSF Latest Ref Range: 15 - 45 mg/dL 952 (H)    Tube # Unknown 4 1   WBC, CSF Latest Ref Range: 0 - 5 /cu mm 119 (HH) 480 (HH)   Color, Synovial Latest Ref Range: YELLOW    YELLOW (A)  Appearance-Synovial Latest Ref Range: CLEAR    CLOUDY (A)  Crystals, Fluid Unknown   NO CRYSTALS SEEN  WBC, Synovial Latest Ref Range: 0 - 200 /cu mm   14,600 (H)  Neutrophil, Synovial Latest Ref Range: 0 - 25 %   92 (H)  Lymphocytes-Synovial Fld Latest Ref Range: 0 - 20 %   2  Monocyte-Macrophage-Synovial Fluid Latest Ref Range: 50 - 90 %   6 (L)  Eosinophils-Synovial Latest Ref Range: 0 - 1 %   0  Other Cells-SYN Unknown   Bacteria noted, c...   +MSSA     BLOOD, URINE, - JAN 2019  Results for SATOSHI, KALAS (MRN [id number]) as of 08/10/2017 09:03  Ref. Range 08/09/2017 16:00  Appearance Latest Ref Range: CLEAR  HAZY (A)  Bilirubin Urine Latest Ref Range: NEGATIVE  NEGATIVE  Color, Urine Latest Ref Range: YELLOW  YELLOW  Glucose Latest Ref Range: NEGATIVE mg/dL NEGATIVE  Hgb urine dipstick Latest Ref Range: NEGATIVE  LARGE (A)  Ketones, ur Latest Ref Range: NEGATIVE mg/dL NEGATIVE  Leukocytes, UA Latest Ref Range: NEGATIVE  NEGATIVE  Nitrite Latest Ref Range: NEGATIVE  NEGATIVE  pH Latest Ref Range: 5.0 - 8.0  5.0   Protein Latest Ref Range: NEGATIVE mg/dL NEGATIVE  Specific Gravity, Urine Latest Ref Range: 1.005 - 1.030  1.019  Bacteria, UA Latest Ref Range: NONE SEEN  NONE SEEN  Mucus Unknown PRESENT  RBC / HPF Latest Ref Range: 0 - 5 RBC/hpf TOO NUMEROUS TO C...  Squamous Epithelial / LPF Latest Ref Range: NONE SEEN  0-5 (A)  WBC, UA Latest Ref Range: 0 - 5 WBC/hpf 0-5    08-09-17 CXR New dense opacity at the right lung base, likely a combination of consolidation and pleural effusion. Pleural effusion component is at least moderate in size. Patchy opacities are seen within the left perihilar and lower lung zones, favor edema. Additional opacity at the left lung base is likely a combination of atelectasis and small pleural effusion. Heart size and mediastinal contours are stable. Osseous structures about the chest are unremarkable.    06-09-17 MRI BRAIN Numerous small foci of acute/early subacute infarction are present involving right-greater-than-left posterior hemispheres, basal ganglia, brainstem, and cerebellum. Multiple vascular territories suggests embolic phenomenon. Petechial hemorrhage is present within the the larger right parietal and right occipital infarcts. No mass effect.  ABDOMINAL SONO 06-08-17  1. Biliary sludge noted within the gallbladder without acute findings of cholecystitis. No biliary dilatation. 2. Mild splenomegaly. EKG 08-10-17- SINUS RHYTHM, NO ACUTE CHANGES  TTE 06-09-2017 Left ventricle:  The cavity size was normal. There was mild focal basal hypertrophy of the septum. Systolic function was normal. The estimated ejection fraction was in the range of 60% to 65%. Wall motion was normal; there were no regional wall motion abnormalities. The transmitral flow pattern was normal. The deceleration time of the early transmitral flow velocity was normal. The pulmonary vein flow pattern was normal. The tissue Doppler parameters were normal. Left ventricular diastolic  function parameters were normal.  ------------------------------------------------------------------- Aortic valve:   Trileaflet; normal thickness leaflets. Mobility was not restricted.  Doppler:  Transvalvular velocity was within the normal range. There was no stenosis. There was no regurgitation.   ------------------------------------------------------------------- Aorta:  Aortic root: The aortic root was normal in size.  ------------------------------------------------------------------- Mitral valve:  There is a shaggy rounded mobile density on the anterior mitral valve leaflet measuring 1.31 x 1.56 cm consistent with vegetation.  Structurally normal valve.   Mobility was not restricted.  Doppler:  Transvalvular velocity was within the normal range. There was no evidence for stenosis. There was mild regurgitation.    Peak gradient (D): 3 mm Hg.  ------------------------------------------------------------------- Left atrium:  The atrium was normal in size.  ------------------------------------------------------------------- Right ventricle:  The cavity size was normal. Wall thickness was normal. Systolic function was normal.  ------------------------------------------------------------------- Pulmonic valve:    Structurally normal valve.   Cusp separation was normal.  Doppler:  Transvalvular velocity was within the normal range. There was no evidence for stenosis. There was no regurgitation.  ------------------------------------------------------------------- Tricuspid valve:   Structurally normal valve.    Doppler: Transvalvular velocity was within the normal range. There was mild regurgitation.  ------------------------------------------------------------------- Pulmonary artery:   The main pulmonary artery was normal-sized. Systolic pressure could not be accurately estimated.  ------------------------------------------------------------------- Right atrium:   The atrium was normal in size.  ------------------------------------------------------------------- Pericardium:  There was no pericardial effusion.  ------------------------------------------------------------------- Systemic veins: Inferior vena cava: The vessel was normal in size.    08-10-17 CT scan pe PROTOCOL  2019  Cardiovascular: Satisfactory opacification of the pulmonary arteries to the segmental level. No evidence of pulmonary embolism. Heart is normal in size and configuration. No pericardial effusion. No coronary artery calcifications. The great vessels normal in caliber. No aortic atherosclerosis. No dissection. Mediastinum/Nodes: No neck base or axillary masses or pathologically enlarged lymph nodes. No mediastinal or hilar  masses or discrete enlarged lymph nodes. The trachea is patent and normal in caliber. Esophagus is unremarkable. Lungs/Pleura: Large right and small left pleural effusions. Complete atelectasis of the right middle lobe and near complete atelectasis of the right lower lobe. Small area of consolidation in the anterior inferior right upper lobe. Patchy consolidation is noted in a peribronchovascular distribution in the left upper lobe and, to lesser degree, left lower lobe. There is dependent opacity also in the left lower lobe consistent with atelectasis. No pneumothorax. Musculoskeletal: There is marked narrowing of the T6-T7 disc space with resorption/irregularity of the lower endplate of T6 in upper endplate of T7, with mild loss of height of these vertebra. This leads to a mild focal kyphosis. This is new since a chest radiograph dated 02/15/2005. A subtle lucency across the T6 spinous process posteriorly suggests a previous fracture. All remaining vertebral bodies are normal in height. No osteoblastic or osteolytic lesions. IMPRESSION: 1. Patchy areas of consolidation in both lungs consistent with multifocal pneumonia. 2. Complete  atelectasis of the right middle lobe and near complete atelectasis of the right lower lobe. Mild dependent atelectasis in the left lower lobe. 3. Large right and small left pleural effusions. 4. Marked loss of the disc space at T6-C7 with irregular resorption of endplates leading to loss of vertebral body height of T6 and T7 and a focal kyphosis. This may reflect the sequelae of an old fracture. It could be due to previous discitis/osteomyelitis.   I did point-of-care ultrasound in the ED:  Large right pleural effusion with free floating atelectatic lung; no fibrinous strands.  Moderate left pleural effusion without fibrinous strands.  The IVC is dilated and compressible.  Good left ventricular contractility.  No right ventricular enlargement.  No pericardial effusion.     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  Presenting with worsening dyspnea, confusion, hypoxia, acute respiratory failure requiring BiPAP, bilateral pleural effusions-right larger than the left, compressive atelectasis of the right lower lobe and middle lobe, patchy infiltrate in the left upper lobe,  Likely has acute congestive heart failure from mitral valve regurgitation secondary to endocarditis.  Pleural effusion on the right is causing compressive atelectasis of the right middle and lower lobes .  He could have healthcare associated pneumonia affecting the left upper lobe.       Neurology History of stroke At risk for septic and metabolic encephalopathy   Cardiology   Repeat 2D echocardiogram  Consult  infectious diseases, cardiology and possibly cardiothoracic  surgery-evaluate for further management of endocarditis.  Cautious diuresis.  Hemodynamically stable at present  Pulmonary   Diagnostic and therapeutic thoracentesis of the right side-could be done by radiology  Please send fluid for Gram stain, cultures, protein, glucose, amylase, pH, LDH, cholesterol, cell count with differential, cytology  Thoracentesis expected to result in reexpansion of the right lung.  BiPAP as needed  Oxygen supplementation to keep saturation greater than 92%  On bronchodilators  Vancomycin and cefepime for healthcare associated pneumonia  No need for steroids at present  GI /nutrition  Diet as tolerated  Renal, fluids, electrolytes Monitor electrolytes and renal function with diuresis  Hematology Monitor blood counts On  DVT prophylaxis Check venous leg Doppler of lower extremities as he is at high risk for DVT   Endocrinology Monitor blood sugars  Infectious diseases   Follow-up blood culture and urine culture  Await infectious diseases consultation  Clarify treatment of endocarditis until now   orthopedic follow-up for the left knee  To evaluate if he needs repeat drainage/debridement  Discussed with Dr. Maralyn Sago Neal-admitting team   Thank you for letting me participate in the care of your patient.  PCCM will follow up.  ^^^^^^^^^^ I  Have personally spent   65  Minutes  In the care of this Patient providing Critical care Services; Time includes review of chart, labs, imaging, coordinating care with other physicians and healthcare team members. Also includes time for frequent reevaluation and additional treatment implementation due to change in clinical condiiton of patient. Excludes time spent for Procedure and Teaching.   ^^^^^^^^^^  Note subject to typographical and grammatical errors;   Any formal questions or concerns about the content, text, or information contained within the body of this dictation should be directly  addressed to the physician  for  clarification.   Caryl Comes, MD Pulmonary and Critical Care Medicine [facility] Pager: [phone]

## 2017-08-10 NOTE — Progress Notes (Signed)
Consent for US guided thoracentesis obtained from Marcus [name]'s father over the phone with two nurses verification.  Marcus DyerYoko Coleson Kant, RN

## 2017-08-10 NOTE — Progress Notes (Signed)
FPTS Interim Progress Note  S: went to do PM check on patient. Patient resting comfortably in NAD. When awoken, patient was oriented x 3 and reports feeling better than this am.   O: BP 96/72 (BP Location: Left Arm)   Pulse (!) 105   Temp 98.4 F (36.9 C) (Axillary)   Resp (!) 23   Ht 5\' 11"  (1.803 m)   Wt 156 lb 1.4 oz (70.8 kg)   SpO2 98%   BMI 21.77 kg/m   Gen: oriented x 3, bipap in place  Resp: Improved air movement, some expiratory wheezing bilaterally  A/P: Acute hypoxic hypercarbic respiratory failure  Septic RLL HAP with pleural effusion: O2 sats remaining in high 90s while on bipap. Patient appears to be more alert and oriented with continued use.   -RVP neg -continue bipap -monitor pleural aspiration labs  -continue vanc/cefepime   Marcus [name], Marcus Darrah, DO 08/10/2017, 10:38 PM PGY-1, [facility] pager [phone]

## 2017-08-10 NOTE — Progress Notes (Signed)
Pharmacy Antibiotic Note  Marcus HawthorneByron T [name] is a 52 y.o. male admitted on 08/09/2017 with pneumonia.  Pharmacy has been consulted for vancomycin dosing.  Plan: Rec'd vanc 1g in ED. Vancomycin 750mg  IV every 8 hours.  Goal trough 15-20 mcg/mL.  Height: 5\' 11"  (180.3 cm) Weight: 150 lb (68 kg) IBW/kg (Calculated) : 75.3  Temp (24hrs), Avg:99.8 F (37.7 C), Min:99 F (37.2 C), Max:100.2 F (37.9 C)  Recent Labs  Lab 08/09/17 1353 08/09/17 1402 08/09/17 1617  WBC 12.5*  --   --   CREATININE 1.02  --   --   LATICACIDVEN  --  1.90 1.54    Estimated Creatinine Clearance: 81.5 mL/min (by C-G formula based on SCr of 1.02 mg/dL).    No Known Allergies   Thank you for allowing pharmacy to be a part of this patient's care.  Marcus GamblesVeronda Dilyn [name], PharmD, BCPS  08/10/2017 1:19 AM

## 2017-08-10 NOTE — ED Notes (Signed)
Patient has hx of CVA (just d/c'd for same) and has deficits on both sides. Lives at home with mom/dad and his son. Wheelchair bound.

## 2017-08-10 NOTE — ED Notes (Signed)
Patient was incontinent of stool and urine. Patient cleaned.

## 2017-08-10 NOTE — Progress Notes (Signed)
  Echocardiogram 2D Echocardiogram has been performed.  Marcus [name] 08/10/2017, 11:34 AM

## 2017-08-10 NOTE — Procedures (Signed)
PROCEDURE SUMMARY:  Successful US guided right thoracentesis. Yielded 1.4 L of hazy pink/blood tinged fluid. Pt tolerated procedure well. No immediate complications.  Specimen was sent for labs. CXR ordered.  Brayton ElBRUNING, Luciano Cinquemani PA-C 08/10/2017 12:39 PM
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
## 2017-08-11 DIAGNOSIS — J189 Pneumonia, unspecified organism: Secondary | ICD-10-CM

## 2017-08-11 DIAGNOSIS — K0889 Other specified disorders of teeth and supporting structures: Secondary | ICD-10-CM

## 2017-08-11 DIAGNOSIS — B182 Chronic viral hepatitis C: Secondary | ICD-10-CM

## 2017-08-11 DIAGNOSIS — F1123 Opioid dependence with withdrawal: Secondary | ICD-10-CM

## 2017-08-11 DIAGNOSIS — I340 Nonrheumatic mitral (valve) insufficiency: Secondary | ICD-10-CM | POA: Diagnosis present

## 2017-08-11 DIAGNOSIS — J90 Pleural effusion, not elsewhere classified: Secondary | ICD-10-CM

## 2017-08-11 DIAGNOSIS — I509 Heart failure, unspecified: Secondary | ICD-10-CM

## 2017-08-11 DIAGNOSIS — R011 Cardiac murmur, unspecified: Secondary | ICD-10-CM

## 2017-08-11 DIAGNOSIS — F13239 Sedative, hypnotic or anxiolytic dependence with withdrawal, unspecified: Secondary | ICD-10-CM

## 2017-08-11 DIAGNOSIS — R0902 Hypoxemia: Secondary | ICD-10-CM

## 2017-08-11 LAB — COMPREHENSIVE METABOLIC PANEL
ALK PHOS: 104 U/L (ref 38–126)
ALT: 12 U/L — AB (ref 17–63)
AST: 15 U/L (ref 15–41)
Albumin: 2 g/dL — ABNORMAL LOW (ref 3.5–5.0)
Anion gap: 11 (ref 5–15)
BUN: 12 mg/dL (ref 6–20)
CHLORIDE: 100 mmol/L — AB (ref 101–111)
CO2: 26 mmol/L (ref 22–32)
CREATININE: 1.12 mg/dL (ref 0.61–1.24)
Calcium: 8.3 mg/dL — ABNORMAL LOW (ref 8.9–10.3)
GFR calc Af Amer: >60 mL/min (ref >60)
Glucose, Bld: 88 mg/dL (ref 65–99)
Potassium: 3.6 mmol/L (ref 3.5–5.1)
SODIUM: 137 mmol/L (ref 135–145)
Total Bilirubin: 0.8 mg/dL (ref 0.3–1.2)
Total Protein: 6.2 g/dL — ABNORMAL LOW (ref 6.5–8.1)

## 2017-08-11 LAB — CBC
HEMATOCRIT: 30.1 % — AB (ref 39.0–52.0)
HEMOGLOBIN: 9.3 g/dL — AB (ref 13.0–17.0)
MCH: 28.1 pg (ref 26.0–34.0)
MCHC: 30.9 g/dL (ref 30.0–36.0)
MCV: 90.9 fL (ref 78.0–100.0)
Platelets: 335 10*3/uL (ref 150–400)
RBC: 3.31 MIL/uL — ABNORMAL LOW (ref 4.22–5.81)
RDW: 15 % (ref 11.5–15.5)
WBC: 11.7 10*3/uL — ABNORMAL HIGH (ref 4.0–10.5)

## 2017-08-11 LAB — TROPONIN I

## 2017-08-11 LAB — PROCALCITONIN: PROCALCITONIN: 0.4 ng/mL

## 2017-08-11 LAB — MRSA PCR SCREENING: MRSA by PCR: POSITIVE — AB

## 2017-08-11 MED ORDER — ACETAMINOPHEN 500 MG PO TABS
500.0000 mg | ORAL_TABLET | Freq: Four times a day (QID) | ORAL | Status: DC | PRN
  Administered 2017-08-13 – 2017-08-17 (×5): 500 mg via ORAL
  Filled 2017-08-11 (×5): qty 1

## 2017-08-11 MED ORDER — FENTANYL CITRATE (PF) 100 MCG/2ML IJ SOLN
50.0000 ug | Freq: Four times a day (QID) | INTRAMUSCULAR | Status: DC | PRN
  Administered 2017-08-12 (×4): 50 ug via INTRAVENOUS
  Filled 2017-08-11 (×4): qty 2

## 2017-08-11 MED ORDER — LORAZEPAM 1 MG PO TABS
1.0000 mg | ORAL_TABLET | Freq: Four times a day (QID) | ORAL | Status: DC | PRN
  Filled 2017-08-11: qty 1

## 2017-08-11 MED ORDER — VITAMIN B-1 100 MG PO TABS
100.0000 mg | ORAL_TABLET | Freq: Every day | ORAL | Status: DC
  Administered 2017-08-11 – 2017-08-27 (×15): 100 mg via ORAL
  Filled 2017-08-11 (×15): qty 1

## 2017-08-11 MED ORDER — LORAZEPAM 2 MG/ML IJ SOLN
1.0000 mg | Freq: Four times a day (QID) | INTRAMUSCULAR | Status: DC | PRN
  Administered 2017-08-11 – 2017-08-14 (×5): 1 mg via INTRAVENOUS
  Filled 2017-08-11 (×6): qty 1

## 2017-08-11 MED ORDER — CEFAZOLIN SODIUM-DEXTROSE 2-4 GM/100ML-% IV SOLN
2.0000 g | Freq: Three times a day (TID) | INTRAVENOUS | Status: DC
  Administered 2017-08-11 – 2017-08-13 (×6): 2 g via INTRAVENOUS
  Filled 2017-08-11 (×7): qty 100

## 2017-08-11 MED ORDER — FOLIC ACID 1 MG PO TABS
1.0000 mg | ORAL_TABLET | Freq: Every day | ORAL | Status: DC
  Administered 2017-08-11 – 2017-08-27 (×15): 1 mg via ORAL
  Filled 2017-08-11 (×15): qty 1

## 2017-08-11 MED ORDER — THIAMINE HCL 100 MG/ML IJ SOLN
100.0000 mg | Freq: Every day | INTRAMUSCULAR | Status: DC
  Administered 2017-08-19: 100 mg via INTRAVENOUS
  Filled 2017-08-11 (×3): qty 2

## 2017-08-11 MED ORDER — LORAZEPAM 2 MG/ML IJ SOLN
0.0000 mg | Freq: Two times a day (BID) | INTRAMUSCULAR | Status: DC
  Administered 2017-08-13 (×2): 1 mg via INTRAVENOUS
  Filled 2017-08-11 (×2): qty 1

## 2017-08-11 MED ORDER — ADULT MULTIVITAMIN W/MINERALS CH
1.0000 | ORAL_TABLET | Freq: Every day | ORAL | Status: DC
  Administered 2017-08-11 – 2017-08-27 (×15): 1 via ORAL
  Filled 2017-08-11 (×15): qty 1

## 2017-08-11 MED ORDER — LORAZEPAM 2 MG/ML IJ SOLN
0.0000 mg | Freq: Four times a day (QID) | INTRAMUSCULAR | Status: DC
  Administered 2017-08-11 – 2017-08-13 (×6): 2 mg via INTRAVENOUS
  Filled 2017-08-11 (×7): qty 1

## 2017-08-11 NOTE — Progress Notes (Signed)
FPTS Interim Progress Note  S: Stopped by to check on patient. He states he is hurting all over and wants to make a phone call.   O: BP 102/70 (BP Location: Right Arm)   Pulse (!) 109   Temp 98.7 F (37.1 C) (Oral)   Resp 17   Ht 5\' 11"  (1.803 m)   Wt 145 lb 8.1 oz (66 kg)   SpO2 96%   BMI 20.29 kg/m    A/P: Withdrawal - CIWA better controlled with scheduled ativan.  -continue current orders  Tachycardia - likely secondary to above. Patient appears slightly dry, but had already received pm lasix dose.  -daily BMP -assess fluid status in am   RLL HAP - weaned to 2L [place].  -continue cefepime, ID managing -continue to monitor blood cultures  Garth Bignessimberlake, Deion Swift, MD 08/11/2017, 8:25 PM PGY-2, [facility] pager [phone]

## 2017-08-11 NOTE — Discharge Summary (Signed)
Family Medicine Teaching [facility] Discharge Summary  Patient name: Marcus [name] Medical record number: [id number] Date of birth: May 17, 1966 Age: 52 y.o. 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 male presenting with worsening SOB. Multifactorial 2/2 new CHF with underlying pneumonia. CXR with signs of pneumonia particularly at right base with associated pleural effusion. Given recent hospitalization, meets criteria for healthcare associated pneumonia. Patient was placed on broad spectrum antibiotic coverage with vanc (3/9) and cefepime. Soon after admission patient began to decompensate and needed to be placed on bipap. Patient was acutely altered at that time. ABG showing acute hypoxic hypercarbic respiratory failure. Pleural effusion was drained by IR and resulted in 1.4L hazy  pink/blood tinged fluid. Serologic tests on fluid showed transudative fluid. Repeat CXR showing improved aeration of right lung base s/p thoracentesis. RVP negative. Patient improved on bipap and was able to be weaned to room air.   Patient h/o endocarditis of mitral vale with embolic stroke. Repeat echo showed worsening LVEF to 55%, vegetation in anterior mitral leaflet, and severe mitral regurgiation. Cardiology was consulted and recommended L&R heart cath and lasix 40mg  daily. Patient had L&R heart cath on 3/19 which showed Mild pulmonary hypertension with prominent V waves noted, consistent with severe mitral regurgitation.. CVTS was consulted and recommended mitral valve replacement only after 6 weeks antibiotics. Dental was consulted and recommended complete extraction which occurred on 08/21/17. Patient was placed on IV ancef on (3/15-3/26) and then transitioned to oral cephalexin (3/26-present) and recommended to continue for 11 days.   MRI of spine on 08/19/17 showing T7-T8 discitis-osteomyelitis without abscess and C6-7 suspicious for discitis-osteomyelitis without abscess. Mild spinal stenosis of T7-8 also seen. ID following with recommendations of of continuing ancef..   Patient with h/o Hepatis C per chart review from hospitalization in January. Patient unaware of diagnosis and no treatment received prior to hospitalization. ID was consulted, patient will need outpatient follow up.   Given history of polysubstance abuse patient was put on CIWA and COWS protocols. Scores remained elevated and patient needed to be placed on scheduled Ativan. Scores improved with scheduled ativan and ativan was able to be weaned until completely off. Scores remained stable prior to discharge.   During admission patient received complete dental extraction on 08/21/17.   Patient developing C-diff. Oral vancomycin was initiated 08/21/17. Patient was transitioned to oral dificid and recommended to continue for 18 days  (7 days past completion of cephalexin). Recommended by ID to continue until ancef is finished.   Issues  for Follow Up:  1. Follow up with infectious disease for treatment of hepatitis C 2. Monitor Hgb 3. Continue IV ancef till 09/24/17 4. CVTS follow up: as patient will likely need surgical correction 5. Continue dificid for 17 days 6. Continue cephalexin for 10 days 7. Follow up with dental  8. Follow up with PCP   Significant Procedures:  08/10/17 - thoracentesis  08/14/17 - TEE 08/19/17 - RIGHT/LEFT HEART CATH AND CORONARY ANGIOGRAPHY  08/19/17 - MRI under general anesthesia  08/21/17 - full dental extraction   Significant Labs and Imaging:  Recent Labs  Lab 08/25/17 0428 08/26/17 0408 08/27/17 0511  WBC 13.7* 13.2* 12.2*  HGB 8.7* 8.6* 8.5*  HCT 27.3* 26.9* 26.3*  PLT 328 363 375   Recent Labs  Lab 08/23/17 0635 08/24/17 0400 08/25/17 0428 08/26/17 0408 08/27/17 0511  NA 128* 131* 127* 129* 131*  K 3.5 3.6 4.0 4.3 4.5  CL 93* 95* 92* 95* 99*  CO2 25 27 26 25 25   GLUCOSE 107* 105* 92 95 95  BUN 18 16 14 14 15   CREATININE 0.88 0.89 0.86 0.82 0.83  CALCIUM 7.7* 8.0* 8.0* 8.2* 8.3*    RIGHT/LEFT HEART CATH AND CORONARY ANGIOGRAPHY:    Mid RCA lesion is 25% stenosed.  Prox LAD lesion is 25% stenosed.  Ost 1st Diag lesion is 70% stenosed. This is a small vessel.  LV end diastolic pressure is normal.  There is no aortic valve stenosis.  Hemodynamic findings consistent with mild pulmonary hypertension.  Ao sat 96%. PA sat 61%. CO 5.03 L/min. CI 2.91. Prominent V waves on the PCWP tracing.   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  Mild pulmonary hypertension with prominent V waves noted, consistent with severe mitral regurgitation.    Results for orders placed or performed during the hospital encounter of 08/09/17  Culture, blood (Routine x 2)     Status: None   Collection Time: 08/09/17  2:25 PM  Result Value Ref Range Status   Specimen Description BLOOD  LEFT WRIST  Final   Special Requests IN PEDIATRIC BOTTLE Blood Culture adequate volume  Final   Culture   Final    NO GROWTH 5 DAYS Performed at [facility], [address] [place], [place] [address]    Report Status 08/14/2017 FINAL  Final  Culture, blood (Routine x 2)     Status: None   Collection Time: 08/09/17  2:48 PM  Result Value Ref Range Status   Specimen Description BLOOD RIGHT HAND  Final   Special Requests IN PEDIATRIC BOTTLE Blood Culture adequate volume  Final   Culture   Final    NO GROWTH 5 DAYS Performed at [facility], [address], [place], [place] [address]    Report Status 08/14/2017 FINAL  Final  Urine culture     Status: Abnormal   Collection Time: 08/09/17  3:50 PM  Result Value Ref Range Status   Specimen Description URINE, CLEAN CATCH  Final   Special Requests   Final    NONE Performed at [facility], [address], [place], [place] [address]    Culture MULTIPLE SPECIES PRESENT, SUGGEST RECOLLECTION (A)  Final   Report Status 08/10/2017 FINAL  Final  Respiratory Panel by PCR     Status: None   Collection Time: 08/09/17  4:00 PM  Result Value Ref Range Status   Adenovirus NOT DETECTED NOT DETECTED Final   Coronavirus 229E NOT DETECTED NOT DETECTED  Final   Coronavirus HKU1 NOT DETECTED NOT DETECTED Final   Coronavirus NL63 NOT DETECTED NOT DETECTED Final   Coronavirus OC43 NOT DETECTED NOT DETECTED Final   Metapneumovirus NOT DETECTED NOT DETECTED Final   Rhinovirus / Enterovirus NOT DETECTED NOT DETECTED Final   Influenza A NOT DETECTED NOT DETECTED Final   Influenza B NOT DETECTED NOT DETECTED Final   Parainfluenza Virus 1 NOT DETECTED NOT DETECTED Final   Parainfluenza Virus 2 NOT DETECTED NOT DETECTED Final   Parainfluenza Virus 3 NOT DETECTED NOT DETECTED Final   Parainfluenza Virus 4 NOT DETECTED NOT DETECTED Final   Respiratory Syncytial Virus NOT DETECTED NOT DETECTED Final   Bordetella pertussis NOT DETECTED NOT  DETECTED Final   Chlamydophila pneumoniae NOT DETECTED NOT DETECTED Final   Mycoplasma pneumoniae NOT DETECTED NOT DETECTED Final    Comment: Performed at [facility], [address], [place], [place] [address]  Culture, blood (routine x 2) Call MD if unable to obtain prior to antibiotics being given     Status: None   Collection Time: 08/10/17  1:10 AM  Result Value Ref Range Status   Specimen Description BLOOD RIGHT HAND  Final   Special Requests IN PEDIATRIC BOTTLE Blood Culture adequate volume  Final   Culture   Final    NO GROWTH 5 DAYS Performed at [facility], [address], [place], [place] [address]    Report Status 08/15/2017 FINAL  Final  Culture, blood (routine x 2) Call MD if unable to obtain prior to antibiotics being given     Status: None   Collection Time: 08/10/17  1:20 AM  Result Value Ref Range Status   Specimen Description BLOOD RIGHT ARM  Final   Special Requests   Final    BOTTLES DRAWN AEROBIC AND ANAEROBIC Blood Culture adequate volume   Culture   Final    NO GROWTH 5 DAYS Performed at [facility], [address], [place], [place] [address]    Report Status 08/15/2017 FINAL  Final  Fungus Culture With Stain     Status: None   Collection Time: 08/10/17 12:59 PM  Result Value Ref Range Status   Fungus Stain Final report  Final   Fungus (Mycology) Culture Final report  Final    Comment: (NOTE) Performed At: [facility] [address] [place], [place] [id number] Jolene Schimke MD OZ:[id number]    Fungal Source PLEURAL  Final    Comment: RIGHT Performed at [facility], [address] [place], [place] [address]   Acid Fast Smear (AFB)     Status: None   Collection Time: 08/10/17 12:59 PM  Result Value Ref Range Status   AFB Specimen Processing Concentration  Final   Acid Fast Smear Negative  Final    Comment: (NOTE) Performed At: [facility] [address] [place], [place] [id number] Jolene Schimke MD  UU:[id number]    Source (AFB) PLEURAL  Final    Comment: RIGHT Performed at [facility], [address], [place], [place] [address]   Culture, body fluid-bottle     Status: None   Collection Time: 08/10/17 12:59 PM  Result Value Ref Range Status   Specimen Description PLEURAL RIGHT  Final   Special Requests NONE  Final   Culture   Final    NO GROWTH 5 DAYS Performed at [facility], [address] [place], [place] [address]    Report Status 08/15/2017 FINAL  Final  Gram stain     Status: None   Collection Time: 08/10/17 12:59 PM  Result Value Ref Range Status   Specimen Description PLEURAL RIGHT  Final   Special Requests NONE  Final   Gram Stain   Final    FEW WBC PRESENT,BOTH PMN AND MONONUCLEAR NO ORGANISMS SEEN Performed at [facility] Lab, [address] [place], [place] [address]    Report Status 08/10/2017 FINAL  Final  Fungus Culture Result     Status: None   Collection Time: 08/10/17 12:59 PM  Result Value Ref Range Status   Result 1 Comment  Final    Comment: (NOTE) KOH/Calcofluor preparation:  no fungus observed. Performed At: [facility] [address] [place], [place] [id number] Jolene Schimke MD [id number] Performed at [facility], [address] [place], [place] [address]   Fungal organism reflex     Status: None   Collection Time: 08/10/17 12:59 PM  Result Value Ref Range Status   Fungal result 1 Comment  Final    Comment: (NOTE) No yeast or mold isolated after 1 week. Performed At: [facility] [address] [place], [place] [id number] Jolene Schimke MD [id number] Performed at [facility] Lab, [address] [place], [place] [address]   MRSA PCR Screening     Status: Abnormal   Collection Time: 08/11/17  6:18 AM  Result Value Ref Range Status   MRSA by PCR POSITIVE (A) NEGATIVE Final    Comment:        The GeneXpert MRSA Assay (FDA approved for NASAL specimens only), is one component of  a comprehensive MRSA colonization surveillance program. It is not intended to diagnose MRSA infection nor to guide or monitor treatment for MRSA infections. RESULT CALLED TO, READ BACK BY AND VERIFIED WITH: RN Osa Craver [id number] [phone] MLM Performed at [facility], [address], [place], [place] [address]   Culture, blood (routine x 2)     Status: None   Collection Time: 08/15/17 10:54 AM  Result Value Ref Range Status   Specimen Description BLOOD RIGHT HAND  Final   Special Requests   Final    BOTTLES DRAWN AEROBIC AND ANAEROBIC Blood Culture results may not be optimal due to an inadequate volume of blood received in culture bottles   Culture   Final    NO GROWTH 5 DAYS Performed at [facility], [address], [place], [place] [address]    Report Status 08/20/2017 FINAL  Final  Culture, blood (routine x 2)     Status: None   Collection Time: 08/15/17 10:54 AM  Result Value Ref Range Status   Specimen Description BLOOD LEFT HAND  Final   Special Requests   Final    BOTTLES DRAWN AEROBIC AND ANAEROBIC Blood Culture adequate volume   Culture   Final    NO GROWTH 5 DAYS Performed at [facility] Lab, [address] [place], [place] [address]    Report Status 08/20/2017 FINAL  Final  C difficile quick scan w PCR reflex     Status: Abnormal   Collection Time: 08/20/17  5:19 PM  Result Value Ref Range Status   C Diff antigen POSITIVE (A) NEGATIVE Final    Comment: CRITICAL RESULT CALLED TO, READ BACK BY AND VERIFIED WITH: M.NEWELL,RN AT 2112 BY L.PITT 08/20/17    C Diff toxin POSITIVE (A) NEGATIVE Final    Comment: CRITICAL RESULT CALLED TO, READ BACK  BY AND VERIFIED WITH: M.NEWELL,RN AT 2112 BY L.PITT 08/20/17    C Diff interpretation Toxin producing [name] detected.  Final    Dg Orthopantogram  Result Date: 08/15/2017 CLINICAL DATA:  Poor dentition.  Cardiac workup. EXAM: ORTHOPANTOGRAM/PANORAMIC COMPARISON:  None. FINDINGS: There are numerous missing  maxillary and mandibular teeth, and multiple remaining maxillary teeth appear broken. There is mild periapical lucency associated with a left maxillary premolar tooth. IMPRESSION: Poor dentition with multiple missing and broken teeth. Mild periapical lucency associated with a left maxillary premolar tooth, abscess not excluded. Electronically Signed   By: Sebastian Ache M.D.   On: 08/15/2017 09:12   Dg Chest 2 View  Result Date: 08/23/2017 CLINICAL DATA:  Bilateral pleural effusions EXAM: CHEST - 2 VIEW COMPARISON:  08/14/2017 the FINDINGS: Moderate right pleural effusion is similar to prior. Tiny left pleural effusion noted. Airspace opacity in the right base has decreased in the interval. Cardiopericardial silhouette is at upper limits of normal for size. The visualized bony structures of the thorax are intact. Telemetry leads overlie the chest. IMPRESSION: Interval decrease and airspace disease at the right base with similar appearance of moderate right and small left pleural effusions. Electronically Signed   By: Kennith Center M.D.   On: 08/23/2017 16:41   Dg Chest 2 View  Result Date: 08/14/2017 CLINICAL DATA:  Followup pneumonia. EXAM: CHEST - 2 VIEW COMPARISON:  08/10/2016 and older studies. FINDINGS: Moderate right and small left pleural effusions. There is associated lung base opacity, greater on the right, which is consistent with atelectasis, pneumonia or a combination. The patchy airspace opacity noted along the perihilar left lung on the prior study has improved. Airspace opacity in the right mid to lower lung appears somewhat less confluent but is unchanged in extent. No new lung abnormalities.  No pneumothorax. IMPRESSION: 1. Mild improvement in lung consolidation compared to the prior exam. 2. Persistent moderate right and small left pleural effusions. No new abnormalities. Electronically Signed   By: Amie Portland M.D.   On: 08/14/2017 13:16   Dg Chest 2 View  Addendum Date: 08/09/2017    ADDENDUM REPORT: 08/09/2017 15:41 ADDENDUM: After further review, there are compression deformities of 2 adjacent vertebral bodies in the mid to lower thoracic spine, of uncertain age but new compared to an older chest x-ray 02/15/2005. Would consider further characterization with CT. These results were called by telephone at the time of interpretation on 08/09/2017 at 3:40 pm to Dr. Doug Sou , who verbally acknowledged these results. Electronically Signed   By: Bary Richard M.D.   On: 08/09/2017 15:41   Result Date: 08/09/2017 CLINICAL DATA:  Shortness of breath for 2 days. Bacterial meningitis diagnosed at the beginning of January, than recently diagnosed with pneumonia. History of TIA in January with left-sided weakness. Former smoker. EXAM: CHEST - 2 VIEW COMPARISON:  Chest x-ray dated 06/08/2017. FINDINGS: New dense opacity at the right lung base, likely a combination of consolidation and pleural effusion. Pleural effusion component is at least moderate in size. Patchy opacities are seen within the left perihilar and lower lung zones, favor edema. Additional opacity at the left lung base is likely a combination of atelectasis and small pleural effusion. Heart size and mediastinal contours are stable. Osseous structures about the chest are unremarkable. IMPRESSION: 1. New dense opacity at the right lung base, likely a combination of pneumonia or atelectasis and pleural effusion. The pleural effusion component is at least moderate in size. Would consider chest CT for  further characterization. 2. Additional patchy ill-defined opacities within the left mid and lower lung, most likely edema, pneumonia considered less likely. 3. Probable mild atelectasis and/or small pleural effusion at the left lung base. Electronically Signed: By: Bary RichardStan  Maynard M.D. On: 08/09/2017 15:29   Ct Angio Chest Pe W And/or Wo Contrast  Result Date: 08/09/2017 CLINICAL DATA:  SOB x2 days. Pt diagnosed with bacterial meningitis at  the beginning of January, then recently diagnosed with pneumonia. Pt also has hx of TIA beginning of January that left him with left sided weakness. Patient had left knee surgery x 6 weeks ago for sepsis. EXAM: CT ANGIOGRAPHY CHEST WITH CONTRAST TECHNIQUE: Multidetector CT imaging of the chest was performed using the standard protocol during bolus administration of intravenous contrast. Multiplanar CT image reconstructions and MIPs were obtained to evaluate the vascular anatomy. CONTRAST:  100mL ISOVUE-370 IOPAMIDOL (ISOVUE-370) INJECTION 76% COMPARISON:  Current chest radiographs and prior studies. FINDINGS: Cardiovascular: Satisfactory opacification of the pulmonary arteries to the segmental level. No evidence of pulmonary embolism. Heart is normal in size and configuration. No pericardial effusion. No coronary artery calcifications. The great vessels normal in caliber. No aortic atherosclerosis. No dissection. Mediastinum/Nodes: No neck base or axillary masses or pathologically enlarged lymph nodes. No mediastinal or hilar masses or discrete enlarged lymph nodes. The trachea is patent and normal in caliber. Esophagus is unremarkable. Lungs/Pleura: Large right and small left pleural effusions. Complete atelectasis of the right middle lobe and near complete atelectasis of the right lower lobe. Small area of consolidation in the anterior inferior right upper lobe. Patchy consolidation is noted in a peribronchovascular distribution in the left upper lobe and, to lesser degree, left lower lobe. There is dependent opacity also in the left lower lobe consistent with atelectasis. No pneumothorax. Upper Abdomen: No acute abnormality. Musculoskeletal: There is marked narrowing of the T6-T7 disc space with resorption/irregularity of the lower endplate of T6 in upper endplate of T7, with mild loss of height of these vertebra. This leads to a mild focal kyphosis. This is new since a chest radiograph dated 02/15/2005. A subtle  lucency across the T6 spinous process posteriorly suggests a previous fracture. All remaining vertebral bodies are normal in height. No osteoblastic or osteolytic lesions. Review of the MIP images confirms the above findings. IMPRESSION: 1. Patchy areas of consolidation in both lungs consistent with multifocal pneumonia. 2. Complete atelectasis of the right middle lobe and near complete atelectasis of the right lower lobe. Mild dependent atelectasis in the left lower lobe. 3. Large right and small left pleural effusions. 4. Marked loss of the disc space at T6-C7 with irregular resorption of endplates leading to loss of vertebral body height of T6 and T7 and a focal kyphosis. This may reflect the sequelae of an old fracture. It could be due to previous discitis/osteomyelitis. Electronically Signed   By: Amie Portlandavid  Ormond M.D.   On: 08/09/2017 18:44   Mr Brain Wo Contrast  Result Date: 08/14/2017 CLINICAL DATA:  52 y/o  M; stroke follow-up. EXAM: MRI HEAD WITHOUT CONTRAST TECHNIQUE: Axial DWI sequence was acquired. Patient combative, unable to continue examination. COMPARISON:  06/09/2017 MRI head.  06/08/2017 CT head. FINDINGS: Low B value DWI sequence demonstrates areas of T2 hyperintensity in the right greater than left frontal, parietal, and occipital lobes as well as splenium of corpus callosum and cerebellum corresponding to infarctions on prior MRI. The high B value sequence is motion degraded, insufficient to assess for reduced diffusion and acute stroke. IMPRESSION: Severe  motion degraded DWI sequence insufficient to assess for acute stroke. Repeat imaging is recommended when patient is able to hold still and follow commands. Electronically Signed   By: Mitzi Hansen M.D.   On: 08/14/2017 21:38   Mr Laqueta Jean ZO Contrast  Result Date: 08/19/2017 CLINICAL DATA:  Hospitalized in January with bacteremia, mitral valve endocarditis, CNS septic emboli, and thoracic spine discitis. Persistent back pain.  EXAM: MRI HEAD WITHOUT AND WITH CONTRAST MRI THORACIC SPINE WITHOUT AND WITH CONTRAST MRI LUMBAR SPINE WITHOUT AND WITH CONTRAST TECHNIQUE: Multiplanar, multiecho pulse sequences of the brain and surrounding structures, and cervical spine, to include the craniocervical junction and cervicothoracic junction, were obtained without and with intravenous contrast. Multiplanar and multiecho pulse sequences of the thoracic and lumbar spine were obtained without and with intravenous contrast. CONTRAST:  9mL MULTIHANCE GADOBENATE DIMEGLUMINE 529 MG/ML IV SOLN COMPARISON:  Brain MRI 06/09/2017. CTA chest, abdomen, and pelvis 08/14/2017. Lumbar spine CT 12/30/2015. FINDINGS: MRI HEAD FINDINGS Brain: Embolic infarcts throughout the brain on the prior MRI demonstrate interval evolution. Associated diffusion abnormality has decreased though not completely resolved in some of the areas of infarction, and there is developing encephalomalacia. Associated chronic blood products are present with the majority of the infarcts. Multiple infarcts demonstrate enhancement. No definite restricted diffusion is seen to clearly indicate acute infarction. There are a few subcentimeter infarcts in the left centrum semiovale which are new from the prior MRI and demonstrate mild trace diffusion abnormality without reduced ADC, consistent with interval subacute infarcts. No intracranial mass, midline shift, or extra-axial fluid collection is seen. There is mild cerebral atrophy. No abnormal meningeal enhancement is identified. Vascular: Major intracranial vascular flow voids are preserved. Skull and upper cervical spine: Unremarkable bone marrow signal. Sinuses/Orbits: Unremarkable orbits. Clear paranasal sinuses. Trace left mastoid fluid. Other: None. MRI THORACIC SPINE FINDINGS Spinal numbering is performed by counting down from the craniocervical junction. This results in different numbering than what was reported on the prior CTA due to  transitional lumbosacral anatomy. Some sequences are up to moderately motion degraded. Alignment: Slight focal kyphosis at T7-8.  No listhesis. Vertebrae: Disc and endplate destruction at T7-8 as seen on prior CT with diffuse enhancement throughout the residual disc space and marrow of both T7 and T8 vertebral bodies extending into the pedicles. Minimal dorsal epidural enhancement at T7-8 without evidence of epidural abscess. Partially visualized marrow edema and enhancement in the C6 and C7 vertebral bodies with endplate irregularity, disc space narrowing, and enhancement anteriorly in the disc space. No evidence of C6-7 epidural abscess on limited sagittal imaging. Very mild endplate edema and enhancement at T11-12 favored to be degenerative given lack of disc abnormality or endplate erosion to suggest infection. Background diminished T1 bone marrow signal intensity diffusely is likely related to patient's known anemia. 1.4 cm enhancing lesion in the posterior elements on the right at T3 is consistent with an hemangioma. A smaller atypical hemangioma is also suspected in the right T2 pedicle. Cord:  Normal signal. Paraspinal and other soft tissues: Paravertebral soft tissue edema/phlegmon from T6-T8. Interspinous soft tissue edema and enhancement at T7-8. No paraspinal abscess identified. Partially visualized prevertebral inflammation in the lower cervical spine. Moderate right and small left pleural effusions with associated compressive atelectasis on the right. Small volume fluid in the esophagus. Partially visualized endotracheal tube and tracheal secretions. Disc levels: Moderate disc space narrowing, disc bulging, and endplate spurring at C7-T1 result in moderate right and mild-to-moderate left neural foraminal stenosis and at most  mild spinal stenosis. Broad-based posterior disc osteophyte complex and facet hypertrophy at T7-8 result in mild spinal stenosis without cord compression. Mild disc bulging and  scattered tiny disc protrusions elsewhere in the thoracic spine do not result in spinal stenosis. MRI LUMBAR SPINE FINDINGS Some sequences are mildly motion degraded. Segmentation: Transitional lumbosacral anatomy with a lumbarized S1. Alignment: Facet mediated anterolisthesis of L5 on S1 measuring 4 mm. Vertebrae: Diffusely diminished T1 bone marrow signal intensity likely related to anemia. Predominantly type 2 degenerative endplate changes at S1-2. Mild type 1 changes at L3-4. No evidence of infectious discitis-osteomyelitis. No fracture. Conus medullaris: Extends to the L2 level and appears normal. Paraspinal and other soft tissues: Mild nonspecific posterior paraspinal muscle edema bilaterally in the mid and lower lumbar spine. No fluid collection. Disc levels: L1-2: Negative. L2-3: Minimal disc bulging without stenosis. L3-4: Mild disc space narrowing. Mild disc bulging and mild facet and ligamentum flavum hypertrophy result in mild bilateral lateral recess stenosis and mild left neural foraminal stenosis without significant spinal stenosis. L4-5: Mild disc space narrowing. Mild disc bulging asymmetric to the right and moderate right and mild left facet hypertrophy result in mild right lateral recess stenosis and mild right neural foraminal stenosis without significant spinal stenosis. L5-S1: Anterolisthesis with disc uncovering and severe facet hypertrophy result in mild spinal stenosis, mild-to-moderate bilateral lateral recess stenosis, and mild-to-moderate bilateral neural foraminal stenosis. S1-2: Transitional level with disc space narrowing. Mild disc bulging and moderate facet arthrosis result in mild left neural foraminal stenosis without spinal stenosis. IMPRESSION: 1. 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 2. Interval tiny subacute infarcts in the left centrum semiovale. No acute infarct. 3. Transitional lumbosacral anatomy as above. 4. 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 No epidural or paraspinal abscess. 5. Partially visualized disc and endplate enhancement at C6-7 suspicious for discitis-osteomyelitis with prevertebral inflammation. No evidence of epidural abscess though this was incompletely imaged. 6. Mild spinal stenosis at T7-8. 7. No evidence of discitis-osteomyelitis in the lumbar spine. 8. Nonspecific bilateral lumbar posterior paraspinal muscle edema without abscess. 9. Lumbar disc and facet degeneration most notable at L5-S1 where there is grade 1 anterolisthesis, mild spinal stenosis, and mild-to-moderate lateral recess and neural foraminal stenosis. 10. Moderate right and small left pleural effusions. Electronically Signed   By: Sebastian Ache M.D.   On: 08/19/2017 15:55   Mr Thoracic Spine W Wo Contrast  Result Date: 08/19/2017 CLINICAL DATA:  Hospitalized in January with bacteremia, mitral valve endocarditis, CNS septic emboli, and thoracic spine discitis. Persistent back pain. EXAM: MRI HEAD WITHOUT AND WITH CONTRAST MRI THORACIC SPINE WITHOUT AND WITH CONTRAST MRI LUMBAR SPINE WITHOUT AND WITH CONTRAST TECHNIQUE: Multiplanar, multiecho pulse sequences of the brain and surrounding structures, and cervical spine, to include the craniocervical junction and cervicothoracic junction, were obtained without and with intravenous contrast. Multiplanar and multiecho pulse sequences of the thoracic and lumbar spine were obtained without and with intravenous contrast. CONTRAST:  9mL MULTIHANCE GADOBENATE DIMEGLUMINE 529 MG/ML IV SOLN COMPARISON:  Brain MRI 06/09/2017. CTA chest, abdomen, and pelvis 08/14/2017. Lumbar spine CT 12/30/2015. FINDINGS: MRI HEAD FINDINGS Brain: Embolic infarcts throughout the brain on the prior MRI demonstrate interval evolution. Associated diffusion abnormality has decreased though not completely resolved in some of the areas of infarction, and there is developing encephalomalacia. Associated chronic blood  products are present with the majority of the infarcts. Multiple infarcts demonstrate enhancement. No definite restricted diffusion is seen to clearly indicate acute infarction. There are  a few subcentimeter infarcts in the left centrum semiovale which are new from the prior MRI and demonstrate mild trace diffusion abnormality without reduced ADC, consistent with interval subacute infarcts. No intracranial mass, midline shift, or extra-axial fluid collection is seen. There is mild cerebral atrophy. No abnormal meningeal enhancement is identified. Vascular: Major intracranial vascular flow voids are preserved. Skull and upper cervical spine: Unremarkable bone marrow signal. Sinuses/Orbits: Unremarkable orbits. Clear paranasal sinuses. Trace left mastoid fluid. Other: None. MRI THORACIC SPINE FINDINGS Spinal numbering is performed by counting down from the craniocervical junction. This results in different numbering than what was reported on the prior CTA due to transitional lumbosacral anatomy. Some sequences are up to moderately motion degraded. Alignment: Slight focal kyphosis at T7-8.  No listhesis. Vertebrae: Disc and endplate destruction at T7-8 as seen on prior CT with diffuse enhancement throughout the residual disc space and marrow of both T7 and T8 vertebral bodies extending into the pedicles. Minimal dorsal epidural enhancement at T7-8 without evidence of epidural abscess. Partially visualized marrow edema and enhancement in the C6 and C7 vertebral bodies with endplate irregularity, disc space narrowing, and enhancement anteriorly in the disc space. No evidence of C6-7 epidural abscess on limited sagittal imaging. Very mild endplate edema and enhancement at T11-12 favored to be degenerative given lack of disc abnormality or endplate erosion to suggest infection. Background diminished T1 bone marrow signal intensity diffusely is likely related to patient's known anemia. 1.4 cm enhancing lesion in the  posterior elements on the right at T3 is consistent with an hemangioma. A smaller atypical hemangioma is also suspected in the right T2 pedicle. Cord:  Normal signal. Paraspinal and other soft tissues: Paravertebral soft tissue edema/phlegmon from T6-T8. Interspinous soft tissue edema and enhancement at T7-8. No paraspinal abscess identified. Partially visualized prevertebral inflammation in the lower cervical spine. Moderate right and small left pleural effusions with associated compressive atelectasis on the right. Small volume fluid in the esophagus. Partially visualized endotracheal tube and tracheal secretions. Disc levels: Moderate disc space narrowing, disc bulging, and endplate spurring at C7-T1 result in moderate right and mild-to-moderate left neural foraminal stenosis and at most mild spinal stenosis. Broad-based posterior disc osteophyte complex and facet hypertrophy at T7-8 result in mild spinal stenosis without cord compression. Mild disc bulging and scattered tiny disc protrusions elsewhere in the thoracic spine do not result in spinal stenosis. MRI LUMBAR SPINE FINDINGS Some sequences are mildly motion degraded. Segmentation: Transitional lumbosacral anatomy with a lumbarized S1. Alignment: Facet mediated anterolisthesis of L5 on S1 measuring 4 mm. Vertebrae: Diffusely diminished T1 bone marrow signal intensity likely related to anemia. Predominantly type 2 degenerative endplate changes at S1-2. Mild type 1 changes at L3-4. No evidence of infectious discitis-osteomyelitis. No fracture. Conus medullaris: Extends to the L2 level and appears normal. Paraspinal and other soft tissues: Mild nonspecific posterior paraspinal muscle edema bilaterally in the mid and lower lumbar spine. No fluid collection. Disc levels: L1-2: Negative. L2-3: Minimal disc bulging without stenosis. L3-4: Mild disc space narrowing. Mild disc bulging and mild facet and ligamentum flavum hypertrophy result in mild bilateral lateral  recess stenosis and mild left neural foraminal stenosis without significant spinal stenosis. L4-5: Mild disc space narrowing. Mild disc bulging asymmetric to the right and moderate right and mild left facet hypertrophy result in mild right lateral recess stenosis and mild right neural foraminal stenosis without significant spinal stenosis. L5-S1: Anterolisthesis with disc uncovering and severe facet hypertrophy result in mild spinal stenosis, mild-to-moderate bilateral lateral  recess stenosis, and mild-to-moderate bilateral neural foraminal stenosis. S1-2: Transitional level with disc space narrowing. Mild disc bulging and moderate facet arthrosis result in mild left neural foraminal stenosis without spinal stenosis. IMPRESSION: 1. 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 2. Interval tiny subacute infarcts in the left centrum semiovale. No acute infarct. 3. Transitional lumbosacral anatomy as above. 4. 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 No epidural or paraspinal abscess. 5. Partially visualized disc and endplate enhancement at C6-7 suspicious for discitis-osteomyelitis with prevertebral inflammation. No evidence of epidural abscess though this was incompletely imaged. 6. Mild spinal stenosis at T7-8. 7. No evidence of discitis-osteomyelitis in the lumbar spine. 8. Nonspecific bilateral lumbar posterior paraspinal muscle edema without abscess. 9. Lumbar disc and facet degeneration most notable at L5-S1 where there is grade 1 anterolisthesis, mild spinal stenosis, and mild-to-moderate lateral recess and neural foraminal stenosis. 10. Moderate right and small left pleural effusions. Electronically Signed   By: Sebastian Ache M.D.   On: 08/19/2017 15:55   Mr Lumbar Spine W Wo Contrast  Result Date: 08/19/2017 CLINICAL DATA:  Hospitalized in January with bacteremia, mitral valve endocarditis, CNS septic emboli, and thoracic spine discitis. Persistent  back pain. EXAM: MRI HEAD WITHOUT AND WITH CONTRAST MRI THORACIC SPINE WITHOUT AND WITH CONTRAST MRI LUMBAR SPINE WITHOUT AND WITH CONTRAST TECHNIQUE: Multiplanar, multiecho pulse sequences of the brain and surrounding structures, and cervical spine, to include the craniocervical junction and cervicothoracic junction, were obtained without and with intravenous contrast. Multiplanar and multiecho pulse sequences of the thoracic and lumbar spine were obtained without and with intravenous contrast. CONTRAST:  9mL MULTIHANCE GADOBENATE DIMEGLUMINE 529 MG/ML IV SOLN COMPARISON:  Brain MRI 06/09/2017. CTA chest, abdomen, and pelvis 08/14/2017. Lumbar spine CT 12/30/2015. FINDINGS: MRI HEAD FINDINGS Brain: Embolic infarcts throughout the brain on the prior MRI demonstrate interval evolution. Associated diffusion abnormality has decreased though not completely resolved in some of the areas of infarction, and there is developing encephalomalacia. Associated chronic blood products are present with the majority of the infarcts. Multiple infarcts demonstrate enhancement. No definite restricted diffusion is seen to clearly indicate acute infarction. There are a few subcentimeter infarcts in the left centrum semiovale which are new from the prior MRI and demonstrate mild trace diffusion abnormality without reduced ADC, consistent with interval subacute infarcts. No intracranial mass, midline shift, or extra-axial fluid collection is seen. There is mild cerebral atrophy. No abnormal meningeal enhancement is identified. Vascular: Major intracranial vascular flow voids are preserved. Skull and upper cervical spine: Unremarkable bone marrow signal. Sinuses/Orbits: Unremarkable orbits. Clear paranasal sinuses. Trace left mastoid fluid. Other: None. MRI THORACIC SPINE FINDINGS Spinal numbering is performed by counting down from the craniocervical junction. This results in different numbering than what was reported on the prior CTA due  to transitional lumbosacral anatomy. Some sequences are up to moderately motion degraded. Alignment: Slight focal kyphosis at T7-8.  No listhesis. Vertebrae: Disc and endplate destruction at T7-8 as seen on prior CT with diffuse enhancement throughout the residual disc space and marrow of both T7 and T8 vertebral bodies extending into the pedicles. Minimal dorsal epidural enhancement at T7-8 without evidence of epidural abscess. Partially visualized marrow edema and enhancement in the C6 and C7 vertebral bodies with endplate irregularity, disc space narrowing, and enhancement anteriorly in the disc space. No evidence of C6-7 epidural abscess on limited sagittal imaging. Very mild endplate edema and enhancement at T11-12 favored to be degenerative given lack of disc abnormality or endplate erosion  to suggest infection. Background diminished T1 bone marrow signal intensity diffusely is likely related to patient's known anemia. 1.4 cm enhancing lesion in the posterior elements on the right at T3 is consistent with an hemangioma. A smaller atypical hemangioma is also suspected in the right T2 pedicle. Cord:  Normal signal. Paraspinal and other soft tissues: Paravertebral soft tissue edema/phlegmon from T6-T8. Interspinous soft tissue edema and enhancement at T7-8. No paraspinal abscess identified. Partially visualized prevertebral inflammation in the lower cervical spine. Moderate right and small left pleural effusions with associated compressive atelectasis on the right. Small volume fluid in the esophagus. Partially visualized endotracheal tube and tracheal secretions. Disc levels: Moderate disc space narrowing, disc bulging, and endplate spurring at C7-T1 result in moderate right and mild-to-moderate left neural foraminal stenosis and at most mild spinal stenosis. Broad-based posterior disc osteophyte complex and facet hypertrophy at T7-8 result in mild spinal stenosis without cord compression. Mild disc bulging and  scattered tiny disc protrusions elsewhere in the thoracic spine do not result in spinal stenosis. MRI LUMBAR SPINE FINDINGS Some sequences are mildly motion degraded. Segmentation: Transitional lumbosacral anatomy with a lumbarized S1. Alignment: Facet mediated anterolisthesis of L5 on S1 measuring 4 mm. Vertebrae: Diffusely diminished T1 bone marrow signal intensity likely related to anemia. Predominantly type 2 degenerative endplate changes at S1-2. Mild type 1 changes at L3-4. No evidence of infectious discitis-osteomyelitis. No fracture. Conus medullaris: Extends to the L2 level and appears normal. Paraspinal and other soft tissues: Mild nonspecific posterior paraspinal muscle edema bilaterally in the mid and lower lumbar spine. No fluid collection. Disc levels: L1-2: Negative. L2-3: Minimal disc bulging without stenosis. L3-4: Mild disc space narrowing. Mild disc bulging and mild facet and ligamentum flavum hypertrophy result in mild bilateral lateral recess stenosis and mild left neural foraminal stenosis without significant spinal stenosis. L4-5: Mild disc space narrowing. Mild disc bulging asymmetric to the right and moderate right and mild left facet hypertrophy result in mild right lateral recess stenosis and mild right neural foraminal stenosis without significant spinal stenosis. L5-S1: Anterolisthesis with disc uncovering and severe facet hypertrophy result in mild spinal stenosis, mild-to-moderate bilateral lateral recess stenosis, and mild-to-moderate bilateral neural foraminal stenosis. S1-2: Transitional level with disc space narrowing. Mild disc bulging and moderate facet arthrosis result in mild left neural foraminal stenosis without spinal stenosis. IMPRESSION: 1. 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 2. Interval tiny subacute infarcts in the left centrum semiovale. No acute infarct. 3. Transitional lumbosacral anatomy as above. 4. 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 No epidural or paraspinal abscess. 5. Partially visualized disc and endplate enhancement at C6-7 suspicious for discitis-osteomyelitis with prevertebral inflammation. No evidence of epidural abscess though this was incompletely imaged. 6. Mild spinal stenosis at T7-8. 7. No evidence of discitis-osteomyelitis in the lumbar spine. 8. Nonspecific bilateral lumbar posterior paraspinal muscle edema without abscess. 9. Lumbar disc and facet degeneration most notable at L5-S1 where there is grade 1 anterolisthesis, mild spinal stenosis, and mild-to-moderate lateral recess and neural foraminal stenosis. 10. Moderate right and small left pleural effusions. Electronically Signed   By: Sebastian Ache M.D.   On: 08/19/2017 15:55   Dg Chest Port 1 View  Result Date: 08/10/2017 CLINICAL DATA:  Post right thora, 1.4 L removed EXAM: PORTABLE CHEST 1 VIEW COMPARISON:  Chest x-ray dated 08/09/2017. FINDINGS: Some improvement in aeration at the left lung base. Residual opacity is presumably atelectasis or pneumonia. Ill-defined opacities persist throughout the left lung, consistent with  the multifocal pneumonia better demonstrated on chest CT of 08/09/2017. Additional persistent atelectasis at the left lung base. No pneumothorax. Heart size and mediastinal contours appear stable. IMPRESSION: 1. Improved aeration at the right lung base status post thoracentesis. Residual opacity is presumably atelectasis or pneumonia. No pneumothorax seen. 2. Stable patchy opacities throughout the left lung, compatible with the multifocal pneumonia demonstrated on yesterday's chest CT. Electronically Signed   By: Bary Richard M.D.   On: 08/10/2017 14:54   Ct Angio Chest/abd/pel For Dissection W And/or W/wo  Result Date: 08/14/2017 CLINICAL DATA:  Septic arterial embolism EXAM: CT ANGIOGRAPHY CHEST, ABDOMEN AND PELVIS TECHNIQUE: Multidetector CT imaging through the chest, abdomen and pelvis was performed  using the standard protocol during bolus administration of intravenous contrast. Multiplanar reconstructed images and MIPs were obtained and reviewed to evaluate the vascular anatomy. CONTRAST:  100 cc ISOVUE-370 IOPAMIDOL (ISOVUE-370) INJECTION 76% COMPARISON:  Chest CT 08/09/2017 FINDINGS: CTA CHEST FINDINGS Cardiovascular: No acute pulmonary embolus. Satisfactory opacification of the aorta and pulmonary arteries. Normal size heart without pericardial effusion. Normal branch pattern of the great vessels off the arch. No coronary arteriosclerosis. No aortic atherosclerosis. No aneurysm. No aortic dissection. Mediastinum/Nodes: No supraclavicular, axillary, mediastinal nor hilar lymphadenopathy. Trachea is patent and normal caliber without intraluminal abnormality. Esophagus is unremarkable. No thyromegaly is noted. Lungs/Pleura: Bilateral moderate-sized pleural effusions slightly smaller on the right with loculated component. Adjacent compressive atelectasis is noted. Patchy airspace opacities in the left upper lobe are redemonstrated, with slight improvement in aeration since prior though the vast majority of the pulmonary opacity still persists. Musculoskeletal: Redemonstration of marked disc space narrowing C6-7 with endplate irregularity of the inferior endplate of T6 superior endplate of T7 with resultant mild focal kyphosis. Review of the MIP images confirms the above findings. CTA ABDOMEN AND PELVIS FINDINGS VASCULAR Aorta: Normal caliber aorta without aneurysm, dissection, vasculitis or significant stenosis. Celiac: Patent without evidence of aneurysm, dissection, vasculitis or significant stenosis. SMA: Patent without evidence of aneurysm, dissection, vasculitis or significant stenosis. Renals: Single renal arteries bilaterally without acute abnormality. No significant stenosis, dissection or evidence of fibromuscular dysplasia. IMA: Patent Inflow: No aneurysm or dissection. Minimal atherosclerotic plaque  in the proximal right common iliac and both distal iliac arteries. No significant stenosis. No dissection or aneurysm. Veins: No obvious venous abnormality within the limitations of this arterial phase study. Review of the MIP images confirms the above findings. NON-VASCULAR Hepatobiliary: No focal liver abnormality is seen. No gallstones, gallbladder wall thickening, or biliary dilatation. Pancreas: Unremarkable. No pancreatic ductal dilatation or surrounding inflammatory changes. Spleen: Heterogeneous enhancement likely due to timing of contrast bolus. No splenomegaly. Adrenals/Urinary Tract: No adrenal no renal masses. No nephrolithiasis nor obstructive uropathy. No hydroureteronephrosis. Unremarkable bladder. Stomach/Bowel: Nondistended stomach with normal small bowel rotation. Mild fluid-filled distention of small bowel loops with slight mural thickening of jejunum may reflect a mild small bowel enteritis. No mechanical source obstruction is seen. Colon is. Normal appearing appendix. Lymphatic: No abdominopelvic adenopathy. Reproductive: Normal size prostate and seminal vesicles. Other: Small amount of fluid in the left inguinal canal. Musculoskeletal: Plate and screw fixation of the left acetabulum. 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 No acute osseous abnormality. There is lower lumbar degenerative facet arthropathy. Minimal anterolisthesis of L4 on L5. Marked disc space narrowing at L5-S1. Review of the MIP images confirms the above findings. IMPRESSION: Chest CT: 1. No acute pulmonary embolus. 2. No aortic aneurysm or dissection. 3. Bilateral moderate-sized pleural effusions slightly smaller on the right  with loculated component redemonstrated. Adjacent compressive atelectasis is seen both lower lobes. 4. Relatively unchanged patchy airspace opacity in the left upper lobe only minimally improved in aeration. 5. Redemonstration of focal kyphosis at T6-7 secondary to endplate irregularities and  disc space narrowing suspicious for changes of discitis/osteomyelitis. MRI would be the study of choice for further correlation as to acuity. Abdomen and pelvic CT: 1. No aortic aneurysm or dissection. Patent branch vessels without significant stenosis or focal abnormality. 2. Mild fluid-filled distention of small bowel which transmural thickening suspicious for changes of mild enteritis. No mechanical bowel obstruction. Electronically Signed   By: Tollie Eth M.D.   On: 08/14/2017 21:21   US Thoracentesis Asp Pleural Space W/img Guide  Result Date: 08/10/2017 INDICATION: Respiratory failure. Large right pleural effusion. Request for diagnostic and therapeutic thoracentesis. EXAM: ULTRASOUND GUIDED RIGHT THORACENTESIS MEDICATIONS: None. COMPLICATIONS: None immediate. PROCEDURE: An ultrasound guided thoracentesis was thoroughly discussed with the patient and questions answered. The benefits, risks, alternatives and complications were also discussed. The patient understands and wishes to proceed with the procedure. Written consent was obtained. Ultrasound was performed to localize and mark an adequate pocket of fluid in the right chest. The area was then prepped and draped in the normal sterile fashion. 1% Lidocaine was used for local anesthesia. Under ultrasound guidance a Safe-T-Centesis catheter was introduced. Thoracentesis was performed. The catheter was removed and a dressing applied. FINDINGS: A total of approximately 1.4 L of pink/blood-tinged fluid was removed. Samples were sent to the laboratory as requested by the clinical team. IMPRESSION: Successful ultrasound guided right thoracentesis yielding 1.4 L of pleural fluid. Read by: Brayton El PA-C Electronically Signed   By: Corlis Leak M.D.   On: 08/10/2017 12:55    Results/Tests Pending at Time of Discharge:  Unresulted Labs (From admission, onward)   Start     Ordered   08/10/17 1303  Acid Fast Culture with reflexed sensitivities  R,   Status:   Canceled     08/10/17 1303       Discharge Medications:  Allergies as of 08/27/2017   No Known Allergies     Medication List    STOP taking these medications   pantoprazole 40 MG tablet Commonly known as:  PROTONIX   polyethylene glycol packet Commonly known as:  MIRALAX / GLYCOLAX   PRESCRIPTION MEDICATION   senna-docusate 8.6-50 MG tablet Commonly known as:  Senokot-S     TAKE these medications   aspirin 81 MG EC tablet Take 1 tablet (81 mg total) by mouth daily.   cephALEXin 500 MG capsule Commonly known as:  KEFLEX Take 1 capsule (500 mg total) by mouth every 8 (eight) hours for 10 days.   fidaxomicin 200 MG Tabs tablet Commonly known as:  DIFICID Take 1 tablet (200 mg total) by mouth 2 (two) times daily for 17 days.   gabapentin 100 MG capsule Commonly known as:  NEURONTIN Take 1 capsule (100 mg total) by mouth at bedtime.   thiamine 100 MG tablet Take 1 tablet (100 mg total) by mouth daily.       Discharge Instructions: Please refer to Patient Instructions section of EMR for full details.  Patient was counseled important signs and symptoms that should prompt return to medical care, changes in medications, dietary instructions, activity restrictions, and follow up appointments.   Follow-Up Appointments: Follow-up Information    Call Charlynne Pander, DDS.   Specialty:  Dentistry Why:  For wound re-check one discharged Contact information: [address] [place] [address] [phone]        [facility] Follow up on 09/09/2017.   Why:  Hospital follow up Appointment @ 9:10 am with Sindy Messing PA- if pt can not keep the scheduled appointment please call the office as soon as possible to reschedule.  Contact information: [address] [place] [address] [phone]       [facility] Follow up.   Specialty:  Home Health Services Why:  Registered Nurse and  Physical Therapy.  Contact information: [address] [place] [address] [phone]        [facility]. Go to.   Why:  This location for Medications- they will range in cost from $4.00-$10.00 Contact information: [address] [address] [phone]       [facility] Follow up.   Why:  Follow up for hepatitis C treatment          Oralia Manis, DO 08/28/2017, 2:30 PM PGY-1, [facility]

## 2017-08-11 NOTE — Progress Notes (Addendum)
Progress Note  Patient Name: Marcus [name] Date of Encounter: 08/11/2017  Primary Cardiologist: Dr. Wyline Mood  Subjective   Confused, having withdrawal sx  - getting IV Ativan.  Denies any SOB  Inpatient Medications    Scheduled Meds: . folic acid  1 mg Oral Daily  . furosemide  40 mg Intravenous BID  . ipratropium-albuterol  3 mL Nebulization Q6H  . LORazepam  0-4 mg Intravenous Q6H   Followed by  . [START ON 08/13/2017] LORazepam  0-4 mg Intravenous Q12H  . multivitamin with minerals  1 tablet Oral Daily  . thiamine  100 mg Oral Daily   Or  . thiamine  100 mg Intravenous Daily   Continuous Infusions: . ceFEPime (MAXIPIME) IV Stopped (08/11/17 0308)   PRN Meds: albuterol, fentaNYL (SUBLIMAZE) injection, LORazepam **OR** LORazepam   Vital Signs    Vitals:   08/10/17 2335 08/11/17 0459 08/11/17 0743 08/11/17 0900  BP: 102/77 97/74 104/76   Pulse: (!) 107 (!) 109 (!) 107 100  Resp: (!) 26 (!) 25 (!) 21   Temp: 98.5 F (36.9 C) 98.9 F (37.2 C) 98.4 F (36.9 C)   TempSrc: Axillary  Axillary   SpO2: 100% 95% 94%   Weight:  145 lb 8.1 oz (66 kg)    Height:        Intake/Output Summary (Last 24 hours) at 08/11/2017 1002 Last data filed at 08/11/2017 0500 Gross per 24 hour  Intake 100 ml  Output 3150 ml  Net -3050 ml   Filed Weights   08/09/17 1331 08/10/17 0930 08/11/17 0459  Weight: 150 lb (68 kg) 156 lb 1.4 oz (70.8 kg) 145 lb 8.1 oz (66 kg)    Telemetry    NSR - Personally Reviewed  ECG    No new EKG to review - Personally Reviewed  Physical Exam   GEN: No acute distress.   Neck: No JVD Cardiac: RRR, no  rubs, or gallops. 2/6 holosystolic at LLSB to apex Respiratory: Clear to auscultation bilaterally. GI: Soft, nontender, non-distended  MS: No edema; No deformity. Neuro:  Nonfocal  Psych: Normal affect   Labs    Chemistry Recent Labs  Lab 08/09/17 1353 08/10/17 0449 08/11/17 0913  NA 136 135 137  K 4.1 4.0 3.6  CL 102 101 100*  CO2 23  24 26   GLUCOSE 118* 125* 88  BUN 15 14 12   CREATININE 1.02 1.05 1.12  CALCIUM 8.3* 8.2* 8.3*  PROT 6.6 6.2* 6.2*  ALBUMIN 2.2* 2.1* 2.0*  AST 19 17 15   ALT 13* 12* 12*  ALKPHOS 145* 121 104  BILITOT 0.7 0.7 0.8  GFRNONAA >60 >60 >60  GFRAA >60 >60 >60  ANIONGAP 11 10 11      Hematology Recent Labs  Lab 08/09/17 1353 08/10/17 0449 08/11/17 0913  WBC 12.5* 12.2* 11.7*  RBC 2.98* 2.84* 3.31*  HGB 8.7* 8.3* 9.3*  HCT 27.7* 26.5* 30.1*  MCV 93.0 93.3 90.9  MCH 29.2 29.2 28.1  MCHC 31.4 31.3 30.9  RDW 15.1 15.6* 15.0  PLT 290 266 335    Cardiac Enzymes Recent Labs  Lab 08/10/17 1041 08/10/17 1709  TROPONINI <0.03 <0.03    Recent Labs  Lab 08/09/17 1615  TROPIPOC 0.00     BNP Recent Labs  Lab 08/09/17 1607  BNP 449.0*     DDimer  Recent Labs  Lab 08/09/17 1607  DDIMER 4.67*     Radiology    Dg Chest 2 View  Addendum Date: 08/09/2017  ADDENDUM REPORT: 08/09/2017 15:41 ADDENDUM: After further review, there are compression deformities of 2 adjacent vertebral bodies in the mid to lower thoracic spine, of uncertain age but new compared to an older chest x-ray 02/15/2005. Would consider further characterization with CT. These results were called by telephone at the time of interpretation on 08/09/2017 at 3:40 pm to Dr. Doug Sou , who verbally acknowledged these results. Electronically Signed   By: Bary Richard M.D.   On: 08/09/2017 15:41   Result Date: 08/09/2017 CLINICAL DATA:  Shortness of breath for 2 days. Bacterial meningitis diagnosed at the beginning of January, than recently diagnosed with pneumonia. History of TIA in January with left-sided weakness. Former smoker. EXAM: CHEST - 2 VIEW COMPARISON:  Chest x-ray dated 06/08/2017. FINDINGS: New dense opacity at the right lung base, likely a combination of consolidation and pleural effusion. Pleural effusion component is at least moderate in size. Patchy opacities are seen within the left perihilar and  lower lung zones, favor edema. Additional opacity at the left lung base is likely a combination of atelectasis and small pleural effusion. Heart size and mediastinal contours are stable. Osseous structures about the chest are unremarkable. IMPRESSION: 1. New dense opacity at the right lung base, likely a combination of pneumonia or atelectasis and pleural effusion. The pleural effusion component is at least moderate in size. Would consider chest CT for further characterization. 2. Additional patchy ill-defined opacities within the left mid and lower lung, most likely edema, pneumonia considered less likely. 3. Probable mild atelectasis and/or small pleural effusion at the left lung base. Electronically Signed: By: Bary Richard M.D. On: 08/09/2017 15:29   Ct Angio Chest Pe W And/or Wo Contrast  Result Date: 08/09/2017 CLINICAL DATA:  SOB x2 days. Pt diagnosed with bacterial meningitis at the beginning of January, then recently diagnosed with pneumonia. Pt also has hx of TIA beginning of January that left him with left sided weakness. Patient had left knee surgery x 6 weeks ago for sepsis. EXAM: CT ANGIOGRAPHY CHEST WITH CONTRAST TECHNIQUE: Multidetector CT imaging of the chest was performed using the standard protocol during bolus administration of intravenous contrast. Multiplanar CT image reconstructions and MIPs were obtained to evaluate the vascular anatomy. CONTRAST:  ISOVUE-370 IOPAMIDOL (ISOVUE-370) INJECTION 76% COMPARISON:  Current chest radiographs and prior studies. FINDINGS: Cardiovascular: Satisfactory opacification of the pulmonary arteries to the segmental level. No evidence of pulmonary embolism. Heart is normal in size and configuration. No pericardial effusion. No coronary artery calcifications. The great vessels normal in caliber. No aortic atherosclerosis. No dissection. Mediastinum/Nodes: No neck base or axillary masses or pathologically enlarged lymph nodes. No mediastinal or hilar  masses or discrete enlarged lymph nodes. The trachea is patent and normal in caliber. Esophagus is unremarkable. Lungs/Pleura: Large right and small left pleural effusions. Complete atelectasis of the right middle lobe and near complete atelectasis of the right lower lobe. Small area of consolidation in the anterior inferior right upper lobe. Patchy consolidation is noted in a peribronchovascular distribution in the left upper lobe and, to lesser degree, left lower lobe. There is dependent opacity also in the left lower lobe consistent with atelectasis. No pneumothorax. Upper Abdomen: No acute abnormality. Musculoskeletal: There is marked narrowing of the T6-T7 disc space with resorption/irregularity of the lower endplate of T6 in upper endplate of T7, with mild loss of height of these vertebra. This leads to a mild focal kyphosis. This is new since a chest radiograph dated 02/15/2005. A subtle lucency across the  T6 spinous process posteriorly suggests a previous fracture. All remaining vertebral bodies are normal in height. No osteoblastic or osteolytic lesions. Review of the MIP images confirms the above findings. IMPRESSION: 1. Patchy areas of consolidation in both lungs consistent with multifocal pneumonia. 2. Complete atelectasis of the right middle lobe and near complete atelectasis of the right lower lobe. Mild dependent atelectasis in the left lower lobe. 3. Large right and small left pleural effusions. 4. Marked loss of the disc space at T6-C7 with irregular resorption of endplates leading to loss of vertebral body height of T6 and T7 and a focal kyphosis. This may reflect the sequelae of an old fracture. It could be due to previous discitis/osteomyelitis. Electronically Signed   By: Amie Portlandavid  Ormond M.D.   On: 08/09/2017 18:44   Dg Chest Port 1 View  Result Date: 08/10/2017 CLINICAL DATA:  Post right thora, 1.4 L removed EXAM: PORTABLE CHEST 1 VIEW COMPARISON:  Chest x-ray dated 08/09/2017. FINDINGS: Some  improvement in aeration at the left lung base. Residual opacity is presumably atelectasis or pneumonia. Ill-defined opacities persist throughout the left lung, consistent with the multifocal pneumonia better demonstrated on chest CT of 08/09/2017. Additional persistent atelectasis at the left lung base. No pneumothorax. Heart size and mediastinal contours appear stable. IMPRESSION: 1. Improved aeration at the right lung base status post thoracentesis. Residual opacity is presumably atelectasis or pneumonia. No pneumothorax seen. 2. Stable patchy opacities throughout the left lung, compatible with the multifocal pneumonia demonstrated on yesterday's chest CT. Electronically Signed   By: Bary RichardStan  Maynard M.D.   On: 08/10/2017 14:54   [place] Thoracentesis Asp Pleural Space W/img Guide  Result Date: 08/10/2017 INDICATION: Respiratory failure. Large right pleural effusion. Request for diagnostic and therapeutic thoracentesis. EXAM: ULTRASOUND GUIDED RIGHT THORACENTESIS MEDICATIONS: None. COMPLICATIONS: None immediate. PROCEDURE: An ultrasound guided thoracentesis was thoroughly discussed with the patient and questions answered. The benefits, risks, alternatives and complications were also discussed. The patient understands and wishes to proceed with the procedure. Written consent was obtained. Ultrasound was performed to localize and mark an adequate pocket of fluid in the right chest. The area was then prepped and draped in the normal sterile fashion. 1% Lidocaine was used for local anesthesia. Under ultrasound guidance a Safe-T-Centesis catheter was introduced. Thoracentesis was performed. The catheter was removed and a dressing applied. FINDINGS: A total of approximately 1.4 L of pink/blood-tinged fluid was removed. Samples were sent to the laboratory as requested by the clinical team. IMPRESSION: Successful ultrasound guided right thoracentesis yielding 1.4 L of pleural fluid. Read by: Brayton ElKevin Bruning PA-C Electronically  Signed   By: Corlis Leak  Hassell M.D.   On: 08/10/2017 12:55    Cardiac Studies   2D echo 08/2017 Study Conclusions  - Left ventricle: The cavity size was normal. Wall thickness was   normal. The estimated ejection fraction was 55%. Wall motion was   normal; there were no regional wall motion abnormalities.   Features are consistent with a pseudonormal left ventricular   filling pattern, with concomitant abnormal relaxation and   increased filling pressure (grade 2 diastolic dysfunction). - Aortic valve: There was no stenosis. There was trivial   regurgitation. - Mitral valve: There appeared to be an anterior leaflet mitral   valve vegetation (not ideally visualized) with severe,   posteriorly-directed mitral regurgitation. - Left atrium: The atrium was moderately dilated. - Right ventricle: The cavity size was normal. Systolic function   was normal. - Tricuspid valve: Peak RV-RA gradient (S): 47 mm  Hg. - Pulmonary arteries: PA peak pressure: 62 mm Hg (S). - Systemic veins: IVC measured 2.13 cm with < 50% respirophasic   variation, suggesting RA pressure 15 mmHg. - Pericardium, extracardiac: A small pericardial effusion was   identified.  Patient Profile     52 y.o. 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   Assessment & Plan    1. Endocarditis with severe MR and acute heart failure - anterior MV leaflet vegetation with mild MR by echo Jan 2019 but now has progressed to severe MR this admit complicated by acute CHF - will set up TEE to evaluate further once his respiratory and mental status have improved and then CVTS consult as he will likely need surgical correction - abx per primary team ID  2. Acute heart failure - pulmonary edema and effusions, elevated BNP  - appears to be secondary to secondary to severe MR. If LVEF truly 55% in severe MR would actually  suggest his forward cardiac output may actually be decreased - He put out 3.1L yesterday and is net neg 2.1L.  His weight is down 5lbs from admit - continue Lasix 40mg  IV BID - follow renal function closely.  Creatinine today stable at 1.12.  3. Anemia - per primary team  4. Pleural effusion/multifocal PNA - 1.4 L removed from right lung.  - follow up lytes criteria, if transudate would support related to CHF - antibx per [facility]      For questions or updates, please contact [facility] Please consult [email or web address] for contact info under Cardiology/STEMI.      Signed, Armanda Magic, MD  08/11/2017, 10:02 AM

## 2017-08-11 NOTE — Progress Notes (Signed)
OT Cancellation Note  Patient Details Name: Marcus [name] MRN: [id number] DOB: 11/21/1965   Cancelled Treatment:    Reason Eval/Treat Not Completed: Other (comment): Discussed with bedside RN. Pt currently agitated and not able to participate in OT evaluation. Will check back in am to initiate evaluation.   Marcus Sectionharity A Json Koelzer, MS OTR/L  Pager: [phone]   Marcus [name] 08/11/2017, 4:20 PM

## 2017-08-11 NOTE — Consult Note (Addendum)
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  He was sent to the emergency room 3/9 because of worsening dyspnea. In the emergency room patient got vancomycin and cefepime.  He was placed on BiPAP for hypoxia with subsequent improvement in oxygenation.  CT pulmonary angiogram protocol CT scan excluded PE.  He had a large right pleural effusion and small left pleural effusion.  There was evidence of multifocal pneumonia as well as complete atelectasis of the right lower lobe and partially of the right middle lobe.   He had right sided thoracentesis on 3/10 with 1.4L hazy pink / blood tinged fluid removed, exudative per LDH.  As of 3/11, thora labs are still pending.   Subjective:  Tolerated BiPAP overnight.   Had right thora yesterday 3/10, 1.4L removed.  Exudative by lights criteria based on LDH.  Additional labs still pending.   Vitals: BP 104/76 (BP Location: Right Arm)   Pulse 100   Temp 98.4 F (36.9 C) (Axillary)   Resp (!) 21   Ht 5\' 11"  (1.803 m)   Wt 66 kg (145 lb 8.1 oz)   SpO2 94%   BMI 20.29 kg/m   Physical Exam: General appearance  Ill-appearing male, not in acute distress  HEENT No subconjunctival hemorrhage No pallor/icterus No nasal congestion or drainage No tongue swelling No swelling in the neck  No neck vein distention    Cardiovascular S1 and S2 regular, tachycardic, Harsh pansystolic murmur in the mitral area Murmur also heard in the parasternal and the aortic areas    Respiratory Chest expansion equal but diminished Breath sounds diminished in bases; dull to percussion- right base No rhonchi or rales   Abdomen   Soft,Nontender,  Slightly  distended No hepatoslpenomegaly ,No mass Bowel sounds+   Extremities Left knee swollen; no erythema, tenderness No  Calf swelling/no tenderness tenderness No clubbing Capillary refill < 2 sec Peripheral pulses DP 2+ Left ankle edema 2 + No ecchymosis, petechiae, subungual hemorrhage  Neurological Alert, awake,confused Pupils equal and reacting No facial asymmetry Tone normal Power 5/5  CMP Latest Ref Rng & Units 08/11/2017 08/10/2017 08/09/2017  Glucose 65 - 99 mg/dL 88 161(W125(H) 960(A118(H)  BUN 6 - 20 mg/dL 12 14 15   Creatinine 0.61 - 1.24 mg/dL 5.401.12 9.811.05 1.911.02  Sodium 135 - 145 mmol/L 137 135 136  Potassium 3.5 - 5.1 mmol/L 3.6 4.0 4.1  Chloride 101 - 111 mmol/L 100(L) 101 102  CO2 22 - 32 mmol/L 26 24 23   Calcium 8.9 - 10.3 mg/dL 8.3(L) 8.2(L) 8.3(L)  Total Protein 6.5 - 8.1 g/dL 6.2(L) 6.2(L) 6.6  Total Bilirubin 0.3 - 1.2 mg/dL 0.8 0.7 0.7  Alkaline Phos 38 - 126 U/L 104 121 145(H)  AST 15 - 41 U/L 15 17 19   ALT 17 - 63 U/L 12(L) 12(L) 13(L)   CBC Latest Ref Rng & Units 08/11/2017 08/10/2017 08/09/2017  WBC 4.0 - 10.5 K/uL 11.7(H) 12.2(H) 12.5(H)  Hemoglobin 13.0 - 17.0 g/dL 4.7(W9.3(L) 8.3(L) 8.7(L)  Hematocrit 39.0 - 52.0 % 30.1(L) 26.5(L) 27.7(L)  Platelets 150 - 400 K/uL 335 266 290      CXR 3/9 > new dense opacity at right lung base, pleural effusion on right. CTA chest 3/10 > consolidated areas in  both lungs c/w multifocal PNA, complete atx RML and near complete RLL.  Large right and small left pleural effusions.  No PE. Echo 3/10 > EF 55%, G2DD, trivial AR, anterior leaflet mitral valve vegetation, mod LA dilation, PAP 62, small pericardial effusion. LE duplex 3/10 > negative.  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  Presenting with confusion, acute respiratory failure requiring BiPAP, bilateral pleural effusions-right larger than the left (s/p thora).  Pleural effusions - s/p right thora 3/10 with 1.4L hazy fluid removed, exudative per lights. Probable HCAP. Acute hypoxic and hypercapnic respiratory failure - due to above, responded well to BiPAP. Plan: F/u pleural fluid studies. Continue empiric abx. Bronchial hygiene. Continue BiPAP PRN.  Recent MV endocarditis with MSSA bacteremia (s/p treatment) - repeat echo with persistent MV vegetation but per ID, possibly a sterile vegetation post abx therapy. Plan: Cards consulted, planning for TEE. Follow cultures and continue abx per ID recs.  Rest per primary team.  PCCM will sign off.  Please do not hesitate to call us back if we can be of any further assistance.   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  IR performed a thora on 3/10 and fluid is consistent with exudate by LDH criteria.  Unfortunately no protein was sent.  WBC was 273 mostly lymphs and glucose was normal excluding an empyema.  I reviewed chest CT myself, bilateral effusions R>L noted.  On exam, decreased BS on the right compared to left.  Discussed with PCCM-NP.  Pleural effusion: while extudative doubt is bacterial infection related.  - No further thora unless symptomatic  - Optimize cardiac function given valvular disease  Hypoxemia:  - Titrate O2 for sat of 88-92%  - Prior to discharge will need an ambulatory desaturation study to determine need for home O2.  Endocarditis:  - Cefepime  - Appreciate input from cards  Maintain on abx.  Image PRN.  If fluid recurs please call PCCM back.  Will  sign off.  Patient seen and examined, agree with above note.  I dictated the care and orders written for this patient under my direction.  Alyson Reedy, MD [phone]

## 2017-08-11 NOTE — Progress Notes (Signed)
[facility]  Date of Admission:  08/09/2017             ASSESSMENT/PLAN  MSSA Endocarditis with Severe Mitral Reguritation - Per patient he completed the 6 weeks of therapy for endocarditis. Now with continued worsening valve function and acute heart failure. Cardiology awaiting TEE for further valve evaluation. Differential still may remain with sterile vegetation given that his blood cultures from 3/9 currently have no growth.   Pleural Effusion - Secondary to acute heart failure related to severe mitral regurgitation. Thoracentesis performed 3/10 with cultures pending. If transudate then no additional antibiotics needed, if exudate will change antibiotics based on culture results. Will continue cefepime currently pending the culture results.   Chronic Hepatitis C without coma - Genotype 2b with viral load of 224,000. AST 17 and ALT 12. No treatment is necessary at this time and would recommend outpatient treatment.   Polysubstance Abuse -  UDS previously noted to be positive for barbituates and opiates. Last COWS scores of 11 and 9 indicating mild withdrawal. Likely the cause of his increasing back pain and restlessness.    Active Problems:   Sepsis (HCC)   Hepatitis C antibody positive in blood   Bacterial endocarditis   Acute heart failure (HCC)   . folic acid  1 mg Oral Daily  . furosemide  40 mg Intravenous BID  . ipratropium-albuterol  3 mL Nebulization Q6H  . LORazepam  0-4 mg Intravenous Q6H   Followed by  . [START ON 08/13/2017] LORazepam  0-4 mg Intravenous Q12H  . multivitamin with minerals  1 tablet Oral Daily  . thiamine  100 mg Oral Daily   Or  . thiamine  100 mg Intravenous Daily    SUBJECTIVE:  Completed right thoracentesis on 3/10 with specimen sent to lab.  Gram stain with no organisms seen.  AFB smear/culture, culture, and fungus culture are in process.  Blood cultures from 3/10 remain in process.  Afebrile overnight with mild  leukocytosis.  Per nursing notes he has been restless/agitated throughout the evening. Awaiting TEE for improvement in mental status.  Respiratory virus panel negative.  Remains on cefepime.  Having back pain this morning. Describes the pain located in his lower back.    No Known Allergies   Review of Systems: Review of Systems  Constitutional: Positive for chills. Negative for fever.  Respiratory: Negative for cough and shortness of breath.   Cardiovascular: Negative for chest pain.  Musculoskeletal: Positive for back pain.  Skin: Negative for rash.  Neurological: Negative for dizziness, speech change, weakness and headaches.    OBJECTIVE: Vitals:   08/10/17 2309 08/10/17 2335 08/11/17 0459 08/11/17 0743  BP: 102/77 102/77 97/74 104/76  Pulse: (!) 109 (!) 107 (!) 109 (!) 107  Resp: (!) 31 (!) 26 (!) 25 (!) 21  Temp:  98.5 F (36.9 C) 98.9 F (37.2 C) 98.4 F (36.9 C)  TempSrc:  Axillary  Axillary  SpO2: 95% 100% 95% 94%  Weight:   145 lb 8.1 oz (66 kg)   Height:       Body mass index is 20.29 kg/m.  Physical Exam  Constitutional: He appears unhealthy.  Lying with head on side rail upon entering. Moaning periodically. Nasal cannula in place.   HENT:  Mouth/Throat: Oropharyngeal exudate present.  Poor dentition.   Cardiovascular: Normal rate. Exam reveals no gallop and no friction rub.  Murmur heard. Pulmonary/Chest: Effort normal and breath sounds normal. No respiratory distress. He has  no wheezes. He has no rales. He exhibits no tenderness.  Neurological:  Lethargic, opens eyes to verbal stimuli; He is oriented to person and place.   Skin: Skin is warm and dry.    Lab Results Lab Results  Component Value Date   WBC 12.2 (H) 08/10/2017   HGB 8.3 (L) 08/10/2017   HCT 26.5 (L) 08/10/2017   MCV 93.3 08/10/2017   PLT 266 08/10/2017    Lab Results  Component Value Date   CREATININE 1.05 08/10/2017   BUN 14 08/10/2017   NA 135 08/10/2017   K 4.0 08/10/2017    CL 101 08/10/2017   CO2 24 08/10/2017    Lab Results  Component Value Date   ALT 12 (L) 08/10/2017   AST 17 08/10/2017   ALKPHOS 121 08/10/2017   BILITOT 0.7 08/10/2017     Microbiology: Recent Results (from the past 240 hour(s))  Culture, blood (Routine x 2)     Status: None (Preliminary result)   Collection Time: 08/09/17  2:25 PM  Result Value Ref Range Status   Specimen Description BLOOD LEFT WRIST  Final   Special Requests IN PEDIATRIC BOTTLE Blood Culture adequate volume  Final   Culture   Final    NO GROWTH < 24 HOURS Performed at [facility], [address], [place], [place] [address]    Report Status PENDING  Incomplete  Culture, blood (Routine x 2)     Status: None (Preliminary result)   Collection Time: 08/09/17  2:48 PM  Result Value Ref Range Status   Specimen Description BLOOD RIGHT HAND  Final   Special Requests IN PEDIATRIC BOTTLE Blood Culture adequate volume  Final   Culture   Final    NO GROWTH 1 DAY Performed at [facility], [address] [place], [place] [address]    Report Status PENDING  Incomplete  Urine culture     Status: Abnormal   Collection Time: 08/09/17  3:50 PM  Result Value Ref Range Status   Specimen Description URINE, CLEAN CATCH  Final   Special Requests   Final    NONE Performed at [facility], [address] [place], [place] [address]    Culture MULTIPLE SPECIES PRESENT, SUGGEST RECOLLECTION (A)  Final   Report Status 08/10/2017 FINAL  Final  Respiratory Panel by PCR     Status: None   Collection Time: 08/09/17  4:00 PM  Result Value Ref Range Status   Adenovirus NOT DETECTED NOT DETECTED Final   Coronavirus 229E NOT DETECTED NOT DETECTED Final   Coronavirus HKU1 NOT DETECTED NOT DETECTED Final   Coronavirus NL63 NOT DETECTED NOT DETECTED Final   Coronavirus OC43 NOT DETECTED NOT DETECTED Final   Metapneumovirus NOT DETECTED NOT DETECTED Final   Rhinovirus / Enterovirus NOT DETECTED NOT DETECTED Final    Influenza A NOT DETECTED NOT DETECTED Final   Influenza B NOT DETECTED NOT DETECTED Final   Parainfluenza Virus 1 NOT DETECTED NOT DETECTED Final   Parainfluenza Virus 2 NOT DETECTED NOT DETECTED Final   Parainfluenza Virus 3 NOT DETECTED NOT DETECTED Final   Parainfluenza Virus 4 NOT DETECTED NOT DETECTED Final   Respiratory Syncytial Virus NOT DETECTED NOT DETECTED Final   Bordetella pertussis NOT DETECTED NOT DETECTED Final   Chlamydophila pneumoniae NOT DETECTED NOT DETECTED Final   Mycoplasma pneumoniae NOT DETECTED NOT DETECTED Final    Comment: Performed at [facility], [address] [place], [place] [address]  Culture, blood (routine x 2) Call MD if unable to obtain prior to antibiotics being given     Status: None (Preliminary result)   Collection Time: 08/10/17  1:10 AM  Result Value Ref Range Status   Specimen Description   Final    BLOOD RIGHT HAND Performed at [facility], [address] [place], [place] [address]    Special Requests IN PEDIATRIC BOTTLE Blood Culture adequate volume  Final   Culture PENDING  Incomplete   Report Status PENDING  Incomplete  Culture, blood (routine x 2) Call MD if unable to obtain prior to antibiotics being given     Status: None (Preliminary result)   Collection Time: 08/10/17  1:20 AM  Result Value Ref Range Status   Specimen Description   Final    BLOOD RIGHT ARM Performed at [facility], [address] [place], [place] [address]    Special Requests   Final    BOTTLES DRAWN AEROBIC AND ANAEROBIC Blood Culture adequate volume   Culture PENDING  Incomplete   Report Status PENDING  Incomplete  Gram stain     Status: None   Collection Time: 08/10/17 12:59 PM  Result Value Ref Range Status   Specimen Description PLEURAL RIGHT  Final   Special Requests NONE  Final   Gram Stain   Final    FEW WBC PRESENT,BOTH PMN AND MONONUCLEAR NO ORGANISMS SEEN Performed at [facility], [address] [place],  [place] [address]    Report Status 08/10/2017 FINAL  Final  MRSA PCR Screening     Status: Abnormal   Collection Time: 08/11/17  6:18 AM  Result Value Ref Range Status   MRSA by PCR POSITIVE (A) NEGATIVE Final    Comment:        The GeneXpert MRSA Assay (FDA approved for NASAL specimens only), is one component of a comprehensive MRSA colonization surveillance program. It is not intended to diagnose MRSA infection nor to guide or monitor treatment for MRSA infections. RESULT CALLED TO, READ BACK BY AND VERIFIED WITH: RN Osa CraverR BUENDIA [phone] MLM Performed at [facility], [address] [place], [place] [address]      Marcos EkeGreg Calone, NP [facility] [phone] Pager  08/11/2017  9:29 AM

## 2017-08-11 NOTE — Progress Notes (Signed)
Patient in bed with eyes closed at the beginning of the shift with BIPAP in place.  However patient woke up and started pulling off the BIPAP stating "take this off, I want to go home now". Mitt placed on bilateral hands to prevent patient from pulling IV and BIPAP, but he continued to pull the tubing. PRN Ativan 2 mg given x 2 for increased anxiety/agitation and was somewhat effective. PRN Fentanyl given for pain and seemed to help. He slept for about an hour and woke up still agitated. His sats continued to drop because he kept messing with with the tubing. RT called for assistance, but patient refused nasal canula. Charge nurse also called to patient room. She was able to convince the patient to place oxygen via nasal canula at 4L.  He has been sating in the 90's and has been taking it off intermittently.

## 2017-08-11 NOTE — Evaluation (Signed)
Physical Therapy Evaluation Patient Details Name: Marcus [name] MRN: [id number] DOB: 09-16-1965 Today's Date: 08/11/2017   History of Present Illness  Marcus [name] is a 52 y.o. male presenting with worsening SOB. 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   Had 1.4 L fluid removed from R lung via thoracentesis.  Dx w/ HCAP.  Clinical Impression  Patient presents with decreased mobility due to weakness, decreased balance, decreased activity tolerance with HR max 124, SpO2 at 77% on RA and RR 40 with bathing and OOB activities.  Feel he may need SNF level rehab upon d/c, but his previous level of function and help at home is unknown.  PT to follow acutely.    Follow Up Recommendations SNF;Supervision/Assistance - 24 hour    Equipment Recommendations  Wheelchair cushion (measurements PT);Wheelchair (measurements PT);Hospital bed    Recommendations for Other Services       Precautions / Restrictions Precautions Precautions: Fall Precaution Comments: bilat mitt restraints      Mobility  Bed Mobility Overal bed mobility: Needs Assistance Bed Mobility: Rolling;Sidelying to Sit Rolling: Max assist Sidelying to sit: Max assist       General bed mobility comments: minimally assists with cues  Transfers Overall transfer level: Needs assistance   Transfers: Squat Pivot Transfers     Squat pivot transfers: +2 physical assistance;Total assist     General transfer comment: bed to recliner with PT/RN assist, bears weight minimally  Ambulation/Gait                Stairs            Wheelchair Mobility    Modified Rankin (Stroke Patients Only)       Balance Overall balance assessment: Needs assistance   Sitting balance-Leahy Scale: Poor Sitting balance - Comments: at least min to mod A for sitting balance, falls anteriorly, but able to pull away from back of chair seated in  recliner                                     Pertinent Vitals/Pain Pain Assessment: Faces Faces Pain Scale: Hurts even more Pain Location: grimacing and moaning Pain Descriptors / Indicators: Grimacing;Guarding Pain Intervention(s): Monitored during session;Repositioned    Home Living Family/patient expects to be discharged to:: Skilled nursing facility                 Additional Comments: patient moans and verbalizes minimally during session    Prior Function           Comments: chart states pt lives with mother and son, was in SNF after January admission     Hand Dominance        Extremity/Trunk Assessment   Upper Extremity Assessment Upper Extremity Assessment: LUE deficits/detail;RUE deficits/detail RUE Deficits / Details: holds hands flexed and close to his body, AAROM WFL, but limited active movement away from core; grips weakly with R hand LUE Deficits / Details: holds hands flexed and close to his body, AAROM WFL, but limited active movement away from core, would not grip with L hand    Lower Extremity Assessment Lower Extremity Assessment: RLE deficits/detail;LLE deficits/detail RLE Deficits / Details: grossly AAROM WFL, strength NT due to cognition, able to squat pivot A on his legs with +2  LLE Deficits / Details: grossly AAROM WFL, strength NT due to cognition,  able to squat pivot A on his legs with +2     Cervical / Trunk Assessment Cervical / Trunk Assessment: Kyphotic;Other exceptions Cervical / Trunk Exceptions: severe trunk weakness  Communication      Cognition Arousal/Alertness: Awake/alert Behavior During Therapy: Anxious;Agitated Overall Cognitive Status: No family/caregiver present to determine baseline cognitive functioning                                        General Comments General comments (skin integrity, edema, etc.): NT in to wash pt in bed, assisted with turning and rolling and OOB for linen  change, RN in room as well; off telemetry and SpO2 monitoring while washing, HR max 124, SpO2 min 77 on RA, returned to 99 on 4L O2 [place], RR max 40    Exercises     Assessment/Plan    PT Assessment Patient needs continued PT services  PT Problem List Decreased strength;Decreased mobility;Decreased safety awareness;Decreased activity tolerance;Decreased coordination;Decreased cognition;Decreased balance;Decreased knowledge of use of DME;Pain       PT Treatment Interventions DME instruction;Therapeutic activities;Gait training;Therapeutic exercise;Patient/family education;Balance training;Wheelchair mobility training;Functional mobility training    PT Goals (Current goals can be found in the Care Plan section)  Acute Rehab PT Goals Patient Stated Goal: none stated PT Goal Formulation: Patient unable to participate in goal setting Time For Goal Achievement: 08/25/17 Potential to Achieve Goals: Fair    Frequency Min 3X/week   Barriers to discharge   unsure of previous level of function or level of assist at home    Co-evaluation               AM-PAC PT "6 Clicks" Daily Activity  Outcome Measure Difficulty turning over in bed (including adjusting bedclothes, sheets and blankets)?: Unable Difficulty moving from lying on back to sitting on the side of the bed? : Unable Difficulty sitting down on and standing up from a chair with arms (e.g., wheelchair, bedside commode, etc,.)?: Unable Help needed moving to and from a bed to chair (including a wheelchair)?: Total Help needed walking in hospital room?: Total Help needed climbing 3-5 steps with a railing? : Total 6 Click Score: 6    End of Session Equipment Utilized During Treatment: Gait belt Activity Tolerance: Treatment limited secondary to agitation;Patient limited by fatigue Patient left: with chair alarm set;with call bell/phone within reach;with nursing/sitter in room;in chair   PT Visit Diagnosis: Other symptoms and  signs involving the nervous system (R29.898);Muscle weakness (generalized) (M62.81);Other abnormalities of gait and mobility (R26.89)    Time: [id number] PT Time Calculation (min) (ACUTE ONLY): 32 min   Charges:   PT Evaluation $PT Eval High Complexity: 1 High PT Treatments $Therapeutic Activity: 8-22 mins   PT G CodesSheran Lawless:        Cyndi Wynn, [place] [phone] 08/11/2017   Elray Mcgregorynthia Wynn 08/11/2017, 10:38 AM

## 2017-08-11 NOTE — Progress Notes (Signed)
  Progress Note   Date: 08/11/2017  Patient Name: Marcus [name]        MRN#: [id number]  Clarification of the diagnosis of heart failure:  Acute heart failure due to valvular insufficiency from endocarditis   Denny LevySara Neal, MD

## 2017-08-11 NOTE — Progress Notes (Signed)
RN called RT because patient was ripping off BiPAP. Upon entering room, patient has ripped mask off and is sating in the 80s. Patient refuses to have oxygen placed on face and says he is tired of this. RN in room with RT and charge nurse called. Doctor will be paged. RT will continue to monitor.

## 2017-08-11 NOTE — Progress Notes (Signed)
Family Medicine Teaching Service Daily Progress Note Intern Pager: [phone]  Patient name: Marcus [name] Medical record number: [id number] Date of birth: [birth year] Age: 52 y.o. Gender: male  Primary Care Provider: Patient, No Pcp Per Consultants: CCM, ID Code Status: Full   Pt Overview and Major Events to Date:  3/09: Admit for sepsis thought to be secondary to HAP and new onset HF, cultures obtained, broad-spectrum antibiotics initiated 3/10: PCCM consulted, tolerating BiPAP  Assessment and Plan: Marcus [name] a 52 y.o.malepresenting with worsening SOB. 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  Acute hypoxic hypercarbic respiratory failure  Septic RLL HAP with pleural effusion Currently satting at 95% on 5L [place], patient did not tolerate bipap and tore bipap off overnight. Does have signs of pneumonia on CXR particularly at right base with associated pleural effusion. Given recent hospitalization, meets criteria for healthcare associated pneumonia. Lactic acidosis resolved. US guided right thoracentesis performed on 08/10/17, yielding 1.4L hazy pink/blood tinged fluid. Repeat CXR showing improved aeration of right lung base s/p thoracentesis. RVP negative.  -CCM consulted, appreciate recommendations:  -Continue supplemental O2 with O2 goal greater than 88%, monitor ABGs -thoracentesis labs pending  -Continue cefepime per pharmacy, pending blood cultures and Legionella and strep pneumo antigen, trending procalcitonin  Chest pain  Endocarditis of mitral valve  h/o embolic stroke and metabolic encephalopathy  new onset heart failure Recently diagnosed with mitral valve endocarditis in January 2019 with methicillin sensitive staph aureus bacteremia and septic left knee arthritis status post aspiration. Patient does have signs concerning for new onset heart failure given elevated BNP and clinical  appearance. Echo on 3/10 showing worsening LVEF to 55%, vegetation in anterior mitral leaflet, and severe mitral regurgiation. Per ID appears to be sterile vegetation. Troponin negative x 2. Net negative -3.050L since admission.  -cardiology consulted, appreciate recommendations: TEE, scheduled lasix 40mg  bid -daily weight -strict Is and Os  Anemia Current Hgb 9.3. Appears to be chronically low with baseline of 8-9. Normocytic  -continue to monitor  Left knee edema  h/o left MSSA septic arthritis Presented with lower extremity edema.  Does have recent septic arthritis of the knee. bilateraly lower extremity dopplers negative for DVT.   Hepatitis C genotype 2B Noted during previous hospitalization. No prior treatment. Recent viral load 224K. LFTs stable.  -ID consulted, appreciate recommendations  Compression deformities of mid to lower thoracic spine Patient reports history of fall back in January 2019 he continues to have persistent low back pain since discharge.  Findings of uncertain age mid to lower thoracic spine compression deformities on CXR.  Patient reports use of OxyContin and oxycodone for pain control though no medications per chart review. -Fentanyl 50 mcg every 6 hours as needed, will wean daily  -PT/OT consulted   Polysubstance abuse UDS positive for barbiturates and opiates on presentation.  Also has history of cocaine back in January 2019.  Has known endocarditis likely from IV drug use though patient reports no recent illicit drug use. Ethanol level within normal limits on presentation. Currently on CIWA and COWS. CIWA elevated overnight to 17>18>18.  -Continue CIWA protocol -Daily folate and thiamine -monitor COWS score  -scheduled Ativan given persistently elevated CIWA scores   FEN/GI: heart healthy diet, NPO prior to TEE  PPx: SCDs  Disposition: continued inpatient stay   Subjective:  Patient today very upset. States he wants to eat. Oriented to person,  place, or time. Reports breathing is improved. Denies  CP.   Objective: Temp:  [98.4 F (36.9 C)-98.9 F (37.2 C)] 98.4 F (36.9 C) (03/11 0743) Pulse Rate:  [97-109] 100 (03/11 0900) Resp:  [21-35] 21 (03/11 0743) BP: (96-141)/(72-97) 104/76 (03/11 0743) SpO2:  [90 %-100 %] 94 % (03/11 0743) FiO2 (%):  [35 %] 35 % (03/10 2335) Weight:  [145 lb 8.1 oz (66 kg)] 145 lb 8.1 oz (66 kg) (03/11 0459) Physical Exam: General: awake, oriented x3   Cardiovascular: RRR, grade 3 systolic murmur  Respiratory: clear breath sounds, improved air movement, no crackles, no increased work of breathing  Abdomen: soft, non tender, non distended, bowel sounds normal  Extremities: no edema, non tender   Laboratory: Recent Labs  Lab 08/09/17 1353 08/10/17 0449 08/11/17 0913  WBC 12.5* 12.2* 11.7*  HGB 8.7* 8.3* 9.3*  HCT 27.7* 26.5* 30.1*  PLT 290 266 335   Recent Labs  Lab 08/09/17 1353 08/10/17 0449 08/11/17 0913  NA 136 135 137  K 4.1 4.0 3.6  CL 102 101 100*  CO2 23 24 26   BUN 15 14 12   CREATININE 1.02 1.05 1.12  CALCIUM 8.3* 8.2* 8.3*  PROT 6.6 6.2* 6.2*  BILITOT 0.7 0.7 0.8  ALKPHOS 145* 121 104  ALT 13* 12* 12*  AST 19 17 15   GLUCOSE 118* 125* 88    Ref. Range 08/11/2017 06:18  MRSA by PCR Latest Ref Range: NEGATIVE  POSITIVE (A)    Ref. Range 08/10/2017 13:27  Color, Fluid Unknown AMBER  WBC, Fluid Latest Ref Range: 0 - 1,000 cu mm 273  Lymphs, Fluid Latest Units: % 51  Eos, Fluid Latest Units: % 3  Appearance, Fluid Latest Ref Range: CLEAR  CLOUDY (A)  Neutrophil Count, Fluid Latest Ref Range: 0 - 25 % 25  Monocyte-Macrophage-Serous Fluid Latest Ref Range: 50 - 90 % 21 (L)    Imaging/Diagnostic Tests: Dg Chest 2 View  Addendum Date: 08/09/2017   ADDENDUM REPORT: 08/09/2017 15:41 ADDENDUM: After further review, there are compression deformities of 2 adjacent vertebral bodies in the mid to lower thoracic spine, of uncertain age but new compared to an older chest x-ray  02/15/2005. Would consider further characterization with CT. These results were called by telephone at the time of interpretation on 08/09/2017 at 3:40 pm to Dr. Doug SouSAM JACUBOWITZ , who verbally acknowledged these results. Electronically Signed   By: Bary RichardStan  Maynard M.D.   On: 08/09/2017 15:41   Result Date: 08/09/2017 CLINICAL DATA:  Shortness of breath for 2 days. Bacterial meningitis diagnosed at the beginning of January, than recently diagnosed with pneumonia. History of TIA in January with left-sided weakness. Former smoker. EXAM: CHEST - 2 VIEW COMPARISON:  Chest x-ray dated 06/08/2017. FINDINGS: New dense opacity at the right lung base, likely a combination of consolidation and pleural effusion. Pleural effusion component is at least moderate in size. Patchy opacities are seen within the left perihilar and lower lung zones, favor edema. Additional opacity at the left lung base is likely a combination of atelectasis and small pleural effusion. Heart size and mediastinal contours are stable. Osseous structures about the chest are unremarkable. IMPRESSION: 1. New dense opacity at the right lung base, likely a combination of pneumonia or atelectasis and pleural effusion. The pleural effusion component is at least moderate in size. Would consider chest CT for further characterization. 2. Additional patchy ill-defined opacities within the left mid and lower lung, most likely edema, pneumonia considered less likely. 3. Probable mild atelectasis and/or small pleural effusion at the left  lung base. Electronically Signed: By: Bary Richard M.D. On: 08/09/2017 15:29   Ct Angio Chest Pe W And/or Wo Contrast  Result Date: 08/09/2017 CLINICAL DATA:  SOB x2 days. Pt diagnosed with bacterial meningitis at the beginning of January, then recently diagnosed with pneumonia. Pt also has hx of TIA beginning of January that left him with left sided weakness. Patient had left knee surgery x 6 weeks ago for sepsis. EXAM: CT ANGIOGRAPHY  CHEST WITH CONTRAST TECHNIQUE: Multidetector CT imaging of the chest was performed using the standard protocol during bolus administration of intravenous contrast. Multiplanar CT image reconstructions and MIPs were obtained to evaluate the vascular anatomy. CONTRAST:  ISOVUE-370 IOPAMIDOL (ISOVUE-370) INJECTION 76% COMPARISON:  Current chest radiographs and prior studies. FINDINGS: Cardiovascular: Satisfactory opacification of the pulmonary arteries to the segmental level. No evidence of pulmonary embolism. Heart is normal in size and configuration. No pericardial effusion. No coronary artery calcifications. The great vessels normal in caliber. No aortic atherosclerosis. No dissection. Mediastinum/Nodes: No neck base or axillary masses or pathologically enlarged lymph nodes. No mediastinal or hilar masses or discrete enlarged lymph nodes. The trachea is patent and normal in caliber. Esophagus is unremarkable. Lungs/Pleura: Large right and small left pleural effusions. Complete atelectasis of the right middle lobe and near complete atelectasis of the right lower lobe. Small area of consolidation in the anterior inferior right upper lobe. Patchy consolidation is noted in a peribronchovascular distribution in the left upper lobe and, to lesser degree, left lower lobe. There is dependent opacity also in the left lower lobe consistent with atelectasis. No pneumothorax. Upper Abdomen: No acute abnormality. Musculoskeletal: There is marked narrowing of the T6-T7 disc space with resorption/irregularity of the lower endplate of T6 in upper endplate of T7, with mild loss of height of these vertebra. This leads to a mild focal kyphosis. This is new since a chest radiograph dated 02/15/2005. A subtle lucency across the T6 spinous process posteriorly suggests a previous fracture. All remaining vertebral bodies are normal in height. No osteoblastic or osteolytic lesions. Review of the MIP images confirms the above findings.  IMPRESSION: 1. Patchy areas of consolidation in both lungs consistent with multifocal pneumonia. 2. Complete atelectasis of the right middle lobe and near complete atelectasis of the right lower lobe. Mild dependent atelectasis in the left lower lobe. 3. Large right and small left pleural effusions. 4. Marked loss of the disc space at T6-C7 with irregular resorption of endplates leading to loss of vertebral body height of T6 and T7 and a focal kyphosis. This may reflect the sequelae of an old fracture. It could be due to previous discitis/osteomyelitis. Electronically Signed   By: Amie Portland M.D.   On: 08/09/2017 18:44   Dg Chest Port 1 View  Result Date: 08/10/2017 CLINICAL DATA:  Post right thora, 1.4 L removed EXAM: PORTABLE CHEST 1 VIEW COMPARISON:  Chest x-ray dated 08/09/2017. FINDINGS: Some improvement in aeration at the left lung base. Residual opacity is presumably atelectasis or pneumonia. Ill-defined opacities persist throughout the left lung, consistent with the multifocal pneumonia better demonstrated on chest CT of 08/09/2017. Additional persistent atelectasis at the left lung base. No pneumothorax. Heart size and mediastinal contours appear stable. IMPRESSION: 1. Improved aeration at the right lung base status post thoracentesis. Residual opacity is presumably atelectasis or pneumonia. No pneumothorax seen. 2. Stable patchy opacities throughout the left lung, compatible with the multifocal pneumonia demonstrated on yesterday's chest CT. Electronically Signed   By: Anne Ng.D.  On: 08/10/2017 14:54   US Thoracentesis Asp Pleural Space W/img Guide  Result Date: 08/10/2017 INDICATION: Respiratory failure. Large right pleural effusion. Request for diagnostic and therapeutic thoracentesis. EXAM: ULTRASOUND GUIDED RIGHT THORACENTESIS MEDICATIONS: None. COMPLICATIONS: None immediate. PROCEDURE: An ultrasound guided thoracentesis was thoroughly discussed with the patient and questions  answered. The benefits, risks, alternatives and complications were also discussed. The patient understands and wishes to proceed with the procedure. Written consent was obtained. Ultrasound was performed to localize and mark an adequate pocket of fluid in the right chest. The area was then prepped and draped in the normal sterile fashion. 1% Lidocaine was used for local anesthesia. Under ultrasound guidance a Safe-T-Centesis catheter was introduced. Thoracentesis was performed. The catheter was removed and a dressing applied. FINDINGS: A total of approximately 1.4 L of pink/blood-tinged fluid was removed. Samples were sent to the laboratory as requested by the clinical team. IMPRESSION: Successful ultrasound guided right thoracentesis yielding 1.4 L of pleural fluid. Read by: Brayton El PA-C Electronically Signed   By: Corlis Leak M.D.   On: 08/10/2017 12:55     Oralia Manis, DO 08/11/2017, 11:12 AM PGY-1, [facility] FPTS Intern pager: [phone], text pages welcome

## 2017-08-12 DIAGNOSIS — F19239 Other psychoactive substance dependence with withdrawal, unspecified: Secondary | ICD-10-CM

## 2017-08-12 LAB — CBC
HCT: 29.1 % — ABNORMAL LOW (ref 39.0–52.0)
Hemoglobin: 9.1 g/dL — ABNORMAL LOW (ref 13.0–17.0)
MCH: 28.3 pg (ref 26.0–34.0)
MCHC: 31.3 g/dL (ref 30.0–36.0)
MCV: 90.4 fL (ref 78.0–100.0)
Platelets: 301 10*3/uL (ref 150–400)
RBC: 3.22 MIL/uL — ABNORMAL LOW (ref 4.22–5.81)
RDW: 14.9 % (ref 11.5–15.5)
WBC: 8.8 10*3/uL (ref 4.0–10.5)

## 2017-08-12 LAB — COMPREHENSIVE METABOLIC PANEL
ALBUMIN: 2.1 g/dL — AB (ref 3.5–5.0)
ALT: 10 U/L — ABNORMAL LOW (ref 17–63)
ANION GAP: 14 (ref 5–15)
AST: 14 U/L — AB (ref 15–41)
Alkaline Phosphatase: 92 U/L (ref 38–126)
BILIRUBIN TOTAL: 0.8 mg/dL (ref 0.3–1.2)
BUN: 13 mg/dL (ref 6–20)
CO2: 27 mmol/L (ref 22–32)
Calcium: 8.1 mg/dL — ABNORMAL LOW (ref 8.9–10.3)
Chloride: 96 mmol/L — ABNORMAL LOW (ref 101–111)
Creatinine, Ser: 1.21 mg/dL (ref 0.61–1.24)
GFR calc Af Amer: >60 mL/min (ref >60)
GFR calc non Af Amer: >60 mL/min (ref >60)
GLUCOSE: 107 mg/dL — AB (ref 65–99)
Potassium: 3.2 mmol/L — ABNORMAL LOW (ref 3.5–5.1)
Sodium: 137 mmol/L (ref 135–145)
TOTAL PROTEIN: 6.3 g/dL — AB (ref 6.5–8.1)

## 2017-08-12 LAB — ACID FAST SMEAR (AFB, MYCOBACTERIA): Acid Fast Smear: NEGATIVE

## 2017-08-12 LAB — ACID FAST SMEAR (AFB)

## 2017-08-12 LAB — PH, BODY FLUID: PH, BODY FLUID: 7.3

## 2017-08-12 LAB — CHOLESTEROL, BODY FLUID: Cholesterol, Fluid: 37 mg/dL

## 2017-08-12 MED ORDER — POTASSIUM CHLORIDE CRYS ER 20 MEQ PO TBCR
40.0000 meq | EXTENDED_RELEASE_TABLET | ORAL | Status: AC
  Administered 2017-08-12 (×2): 40 meq via ORAL
  Filled 2017-08-12: qty 2

## 2017-08-12 MED ORDER — MUPIROCIN 2 % EX OINT
TOPICAL_OINTMENT | Freq: Two times a day (BID) | CUTANEOUS | Status: DC
  Administered 2017-08-12: 22:00:00 via NASAL
  Administered 2017-08-13 – 2017-08-14 (×3): 1 via NASAL
  Administered 2017-08-14 – 2017-08-18 (×9): via NASAL
  Administered 2017-08-19: 1 via NASAL
  Administered 2017-08-19 – 2017-08-23 (×8): via NASAL
  Administered 2017-08-24: 1 via NASAL
  Administered 2017-08-24 – 2017-08-25 (×3): via NASAL
  Administered 2017-08-26: 1 via NASAL
  Administered 2017-08-26: 21:00:00 via NASAL
  Administered 2017-08-27: 1 via NASAL
  Filled 2017-08-12 (×4): qty 22

## 2017-08-12 NOTE — Evaluation (Signed)
Occupational Therapy Evaluation Patient Details Name: Marcus [name] MRN: [id number] DOB: 09/18/1965 Today's Date: 08/12/2017    History of Present Illness Marcus [name] is a 52 y.o. male presenting with worsening SOB. 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   Had 1.4 L fluid removed from R lung via thoracentesis.  Dx w/ HCAP.   Clinical Impression   Per chart, pt has been living with mother and son and unsure of assistance level required for ADL participation. He was able to report some home information to OT but unsure of accuracy secondary to confusion. Pt presents with poor awareness, orientation, and attention skills as well as generalized weakness impacting his ability to participate. Pt currently requires total assistance for ADL participation although feel he will demonstrate improved ability to participate when more alert and motivated to engage. Pt would benefit from continued OT services while admitted to improve functional independence. Recommend continued rehabilitation at SNF level post-acute discharge. Will continue to follow.      Follow Up Recommendations  SNF;Supervision/Assistance - 24 hour    Equipment Recommendations  Other (comment)(defer to next venue of care)    Recommendations for Other Services       Precautions / Restrictions Precautions Precautions: Fall Precaution Comments: bilat mitt restraints      Mobility Bed Mobility Overal bed mobility: Needs Assistance Bed Mobility: Rolling           General bed mobility comments: Pt rolling partially to both sides but refuses to complete further bed mobility secondary to potential of pain.   Transfers                      Balance                                           ADL either performed or assessed with clinical judgement   ADL Overall ADL's : Needs assistance/impaired                                        General ADL Comments: At current functional level requires total assistance for ADL participation. Reports "I don't want to do any of this" when asked to wash his face and becoming agitated with OT encouraging movement.      Vision   Additional Comments: Unsure of baseline as pt not in right state to report.      Perception     Praxis      Pertinent Vitals/Pain Pain Assessment: Faces Faces Pain Scale: Hurts a little bit Pain Location: grimacing and moaning Pain Descriptors / Indicators: Grimacing;Guarding Pain Intervention(s): Monitored during session;Repositioned     Hand Dominance     Extremity/Trunk Assessment Upper Extremity Assessment Upper Extremity Assessment: RUE deficits/detail;LUE deficits/detail RUE Deficits / Details: Grossly 4/5 strength. Wrist held in flexion.  LUE Deficits / Details: Grossly 3/5 strength and holds wrist in flexion.    Lower Extremity Assessment Lower Extremity Assessment: Defer to PT evaluation       Communication     Cognition Arousal/Alertness: Lethargic;Suspect due to medications Behavior During Therapy: Anxious;Agitated Overall Cognitive Status: No family/caregiver present to determine baseline cognitive functioning Area of Impairment: Orientation;Attention;Following commands;Safety/judgement;Awareness  Orientation Level: Disoriented to;Situation;Time Current Attention Level: Focused   Following Commands: Follows one step commands inconsistently Safety/Judgement: Decreased awareness of safety;Decreased awareness of deficits Awareness: Intellectual   General Comments: Pt reports the year is [id number] but is [facility]. Fluctuating status as pt one moment agitated and the next moment apologetic for speaking harshly.    General Comments       Exercises     Shoulder Instructions      Home Living Family/patient expects to be discharged to:: Skilled  nursing facility     Type of Home: House Home Access: Stairs to enter Entrance Stairs-Number of Steps: 4   Home Layout: One level     Bathroom Shower/Tub: Tub/shower unit             Additional Comments: Pt able to answer some PLOF and home set-up questions but unsure of accuracy of report as pt disoriented      Prior Functioning/Environment          Comments: chart states pt lives with mother and son, was in SNF after January admission        OT Problem List: Decreased strength;Decreased activity tolerance;Impaired balance (sitting and/or standing);Decreased coordination;Decreased cognition;Decreased safety awareness;Decreased knowledge of use of DME or AE;Decreased knowledge of precautions;Cardiopulmonary status limiting activity;Pain      OT Treatment/Interventions: Self-care/ADL training;Therapeutic exercise;Energy conservation;DME and/or AE instruction;Therapeutic activities;Patient/family education;Balance training    OT Goals(Current goals can be found in the care plan section) Acute Rehab OT Goals Patient Stated Goal: none stated OT Goal Formulation: With patient Time For Goal Achievement: 08/26/17 Potential to Achieve Goals: Good ADL Goals Pt Will Perform Grooming: with min assist;sitting(no more than 2 VC) Pt Will Transfer to Toilet: with min assist;stand pivot transfer Pt Will Perform Toileting - Clothing Manipulation and hygiene: with min assist;sit to/from stand Additional ADL Goal #1: Pt will demonstrate emergent awareness during seated grooming tasks. Additional ADL Goal #2: Pt will sustain attention during 5 minute seated ADL task with no more than 1 VC in a non-distracting environment.  OT Frequency: Min 2X/week   Barriers to D/C:            Co-evaluation              AM-PAC PT "6 Clicks" Daily Activity     Outcome Measure Help from another person eating meals?: Total Help from another person taking care of personal grooming?:  Total Help from another person toileting, which includes using toliet, bedpan, or urinal?: Total Help from another person bathing (including washing, rinsing, drying)?: Total Help from another person to put on and taking off regular upper body clothing?: Total Help from another person to put on and taking off regular lower body clothing?: Total 6 Click Score: 6   End of Session Nurse Communication: Mobility status  Activity Tolerance: Patient tolerated treatment well Patient left: in bed;with call bell/phone within reach;with bed alarm set  OT Visit Diagnosis: Other abnormalities of gait and mobility (R26.89);Other symptoms and signs involving cognitive function;Pain Pain - part of body: (back)                Time: [id number] OT Time Calculation (min): 11 min Charges:  OT General Charges $OT Visit: 1 Visit OT Evaluation $OT Eval Low Complexity: 1 Low G-Codes:     Doristine Sectionharity A Lanay Zinda, MS OTR/L  Pager: [phone]   Latorya Bautch A Keeghan Mcintire 08/12/2017, 2:35 PM

## 2017-08-12 NOTE — Progress Notes (Signed)
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 Will continue cefazolin pending results.   Polysubtance Abuse - COWS scores seem to be improving in addition to CIWA protocol. Additional interventions per primary team.    Active Problems:   Sepsis (HCC)   Hepatitis C antibody positive in blood   Bacterial endocarditis   Acute heart failure (HCC)   Pleural effusion on right   Severe mitral regurgitation   . folic acid  1 mg Oral Daily  . LORazepam  0-4 mg Intravenous Q6H   Followed by  . [START ON 08/13/2017] LORazepam  0-4 mg Intravenous Q12H  . multivitamin with minerals  1 tablet Oral Daily  . thiamine  100 mg Oral Daily   Or  . thiamine  100 mg Intravenous Daily    SUBJECTIVE:  Pleural fluid with WBC of 273 and lymph of 51 with minimal exudate. Was changed to cefazolin yesterday. Blood cultures from 3/10 with no growth to date. He is tolerating BiPap. Does remain tachycardic. Cardiology awaiting improvement in mental status for TEE and then CVTS consult.   Drowsy and sleepy. Given dose of lorazepam recently.   No Known Allergies   Review of Systems: Review of Systems  Unable to perform ROS: Medical condition      OBJECTIVE: Vitals:   08/12/17 0307 08/12/17 0358 08/12/17 0400 08/12/17 0732  BP: 106/71  (!) 110/93 108/67  Pulse: (!) 106  (!) 104 (!) 108  Resp: 18  (!) 28   Temp: 98.6 F (37 C)   (!) 97.5 F (36.4 C)  TempSrc: Oral   Oral  SpO2: 92%  95% 96%  Weight:  136 lb 0.4 oz (61.7 kg)    Height:       Body mass index is 18.97 kg/m.  Physical Exam  Constitutional:  Lethargic to tactile stimulation and easily drops off to sleep.  Cardiovascular: Exam reveals no gallop and no friction rub.  Murmur heard. Pulmonary/Chest: Effort normal and breath sounds normal. No respiratory distress. He has no  wheezes. He has no rales. He exhibits no tenderness.  Abdominal: Soft. Bowel sounds are normal.    Lab Results Lab Results  Component Value Date   WBC 8.8 08/12/2017   HGB 9.1 (L) 08/12/2017   HCT 29.1 (L) 08/12/2017   MCV 90.4 08/12/2017   PLT 301 08/12/2017    Lab Results  Component Value Date   CREATININE 1.21 08/12/2017   BUN 13 08/12/2017   NA 137 08/12/2017   K 3.2 (L) 08/12/2017   CL 96 (L) 08/12/2017   CO2 27 08/12/2017    Lab Results  Component Value Date   ALT 10 (L) 08/12/2017   AST 14 (L) 08/12/2017   ALKPHOS 92 08/12/2017   BILITOT 0.8 08/12/2017     Microbiology: Recent Results (from the past 240 hour(s))  Culture, blood (Routine x 2)     Status: None (Preliminary result)   Collection Time: 08/09/17  2:25 PM  Result Value Ref Range Status   Specimen Description BLOOD LEFT WRIST  Final   Special Requests IN PEDIATRIC BOTTLE Blood Culture adequate volume  Final   Culture   Final    NO GROWTH 2 DAYS Performed at [facility], [address], [place], [place] [address]    Report Status  PENDING  Incomplete  Culture, blood (Routine x 2)     Status: None (Preliminary result)   Collection Time: 08/09/17  2:48 PM  Result Value Ref Range Status   Specimen Description BLOOD RIGHT HAND  Final   Special Requests IN PEDIATRIC BOTTLE Blood Culture adequate volume  Final   Culture   Final    NO GROWTH 2 DAYS Performed at [facility], [address] [place], [place] [address]    Report Status PENDING  Incomplete  Urine culture     Status: Abnormal   Collection Time: 08/09/17  3:50 PM  Result Value Ref Range Status   Specimen Description URINE, CLEAN CATCH  Final   Special Requests   Final    NONE Performed at [facility], [address] [place], [place] [address]    Culture MULTIPLE SPECIES PRESENT, SUGGEST RECOLLECTION (A)  Final   Report Status 08/10/2017 FINAL  Final  Respiratory Panel by PCR     Status: None   Collection Time:  08/09/17  4:00 PM  Result Value Ref Range Status   Adenovirus NOT DETECTED NOT DETECTED Final   Coronavirus 229E NOT DETECTED NOT DETECTED Final   Coronavirus HKU1 NOT DETECTED NOT DETECTED Final   Coronavirus NL63 NOT DETECTED NOT DETECTED Final   Coronavirus OC43 NOT DETECTED NOT DETECTED Final   Metapneumovirus NOT DETECTED NOT DETECTED Final   Rhinovirus / Enterovirus NOT DETECTED NOT DETECTED Final   Influenza A NOT DETECTED NOT DETECTED Final   Influenza B NOT DETECTED NOT DETECTED Final   Parainfluenza Virus 1 NOT DETECTED NOT DETECTED Final   Parainfluenza Virus 2 NOT DETECTED NOT DETECTED Final   Parainfluenza Virus 3 NOT DETECTED NOT DETECTED Final   Parainfluenza Virus 4 NOT DETECTED NOT DETECTED Final   Respiratory Syncytial Virus NOT DETECTED NOT DETECTED Final   Bordetella pertussis NOT DETECTED NOT DETECTED Final   Chlamydophila pneumoniae NOT DETECTED NOT DETECTED Final   Mycoplasma pneumoniae NOT DETECTED NOT DETECTED Final    Comment: Performed at [facility], [address] [place], [place] [address]  Culture, blood (routine x 2) Call MD if unable to obtain prior to antibiotics being given     Status: None (Preliminary result)   Collection Time: 08/10/17  1:10 AM  Result Value Ref Range Status   Specimen Description BLOOD RIGHT HAND  Final   Special Requests IN PEDIATRIC BOTTLE Blood Culture adequate volume  Final   Culture   Final    NO GROWTH 1 DAY Performed at [facility], [address] [place], [place] [address]    Report Status PENDING  Incomplete  Culture, blood (routine x 2) Call MD if unable to obtain prior to antibiotics being given     Status: None (Preliminary result)   Collection Time: 08/10/17  1:20 AM  Result Value Ref Range Status   Specimen Description BLOOD RIGHT ARM  Final   Special Requests   Final    BOTTLES DRAWN AEROBIC AND ANAEROBIC Blood Culture adequate volume   Culture   Final    NO GROWTH 1 DAY Performed at [facility], [address] [place], [place] [address]    Report Status PENDING  Incomplete  Culture, body fluid-bottle     Status: None (Preliminary result)   Collection Time: 08/10/17 12:59 PM  Result Value Ref Range Status   Specimen Description PLEURAL RIGHT  Final   Special Requests NONE  Final  Culture   Final    NO GROWTH < 24 HOURS Performed at [facility], [address] [place], [place] [address]    Report Status PENDING  Incomplete  Gram stain     Status: None   Collection Time: 08/10/17 12:59 PM  Result Value Ref Range Status   Specimen Description PLEURAL RIGHT  Final   Special Requests NONE  Final   Gram Stain   Final    FEW WBC PRESENT,BOTH PMN AND MONONUCLEAR NO ORGANISMS SEEN Performed at [facility], [address], [place], [place] [address]    Report Status 08/10/2017 FINAL  Final  MRSA PCR Screening     Status: Abnormal   Collection Time: 08/11/17  6:18 AM  Result Value Ref Range Status   MRSA by PCR POSITIVE (A) NEGATIVE Final    Comment:        The GeneXpert MRSA Assay (FDA approved for NASAL specimens only), is one component of a comprehensive MRSA colonization surveillance program. It is not intended to diagnose MRSA infection nor to guide or monitor treatment for MRSA infections. RESULT CALLED TO, READ BACK BY AND VERIFIED WITH: RN Osa Craver [id number] [phone] MLM Performed at [facility], [address], [place], [place] [address]      Marcos Eke, NP [facility] [phone] Pager  08/12/2017  9:56 AM

## 2017-08-12 NOTE — Progress Notes (Signed)
Progress Note  Patient Name: Marcus [name] Date of Encounter: 08/12/2017  Primary Cardiologist: Dr. Wyline Mood  Subjective   Knows who and where he is, wants to get out of bed, wants something to drink  Inpatient Medications    Scheduled Meds: . folic acid  1 mg Oral Daily  . furosemide  40 mg Intravenous BID  . LORazepam  0-4 mg Intravenous Q6H   Followed by  . [START ON 08/13/2017] LORazepam  0-4 mg Intravenous Q12H  . multivitamin with minerals  1 tablet Oral Daily  . thiamine  100 mg Oral Daily   Or  . thiamine  100 mg Intravenous Daily   Continuous Infusions: .  ceFAZolin (ANCEF) IV Stopped (08/12/17 0204)   PRN Meds: acetaminophen, albuterol, fentaNYL (SUBLIMAZE) injection, LORazepam **OR** LORazepam   Vital Signs    Vitals:   08/12/17 0307 08/12/17 0358 08/12/17 0400 08/12/17 0732  BP: 106/71  (!) 110/93 108/67  Pulse: (!) 106  (!) 104 (!) 108  Resp: 18  (!) 28   Temp: 98.6 F (37 C)   (!) 97.5 F (36.4 C)  TempSrc: Oral   Oral  SpO2: 92%  95% 96%  Weight:  136 lb 0.4 oz (61.7 kg)    Height:        Intake/Output Summary (Last 24 hours) at 08/12/2017 0843 Last data filed at 08/12/2017 0500 Gross per 24 hour  Intake 360 ml  Output 2250 ml  Net -1890 ml   Filed Weights   08/10/17 0930 08/11/17 0459 08/12/17 0358  Weight: 156 lb 1.4 oz (70.8 kg) 145 lb 8.1 oz (66 kg) 136 lb 0.4 oz (61.7 kg)    Telemetry    NSR - Personally Reviewed  ECG    No new EKG to review - Personally Reviewed  Physical Exam   GEN: No acute distress.   Neck: No JVD Cardiac: RRR, no  rubs, or gallops. 2/6 holosystolic at LLSB to apex Respiratory: decreased BS bases bilaterally. GI: Soft, nontender, non-distended  MS: No edema; No deformity. Neuro:  Nonfocal   Labs    Chemistry Recent Labs  Lab 08/10/17 0449 08/11/17 0913 08/12/17 0423  NA 135 137 137  K 4.0 3.6 3.2*  CL 101 100* 96*  CO2 24 26 27   GLUCOSE 125* 88 107*  BUN 14 12 13   CREATININE 1.05 1.12 1.21    CALCIUM 8.2* 8.3* 8.1*  PROT 6.2* 6.2* 6.3*  ALBUMIN 2.1* 2.0* 2.1*  AST 17 15 14*  ALT 12* 12* 10*  ALKPHOS 121 104 92  BILITOT 0.7 0.8 0.8  GFRNONAA >60 >60 >60  GFRAA >60 >60 >60  ANIONGAP 10 11 14      Hematology Recent Labs  Lab 08/10/17 0449 08/11/17 0913 08/12/17 0423  WBC 12.2* 11.7* 8.8  RBC 2.84* 3.31* 3.22*  HGB 8.3* 9.3* 9.1*  HCT 26.5* 30.1* 29.1*  MCV 93.3 90.9 90.4  MCH 29.2 28.1 28.3  MCHC 31.3 30.9 31.3  RDW 15.6* 15.0 14.9  PLT 266 335 301    Cardiac Enzymes Recent Labs  Lab 08/10/17 1041 08/10/17 1709 08/11/17 0913  TROPONINI <0.03 <0.03 <0.03    Recent Labs  Lab 08/09/17 1615  TROPIPOC 0.00     BNP Recent Labs  Lab 08/09/17 1607  BNP 449.0*     DDimer  Recent Labs  Lab 08/09/17 1607  DDIMER 4.67*     Radiology    Dg Chest Port 1 View  Result Date: 08/10/2017 CLINICAL DATA:  Post right thora, 1.4 L removed EXAM: PORTABLE CHEST 1 VIEW COMPARISON:  Chest x-ray dated 08/09/2017. FINDINGS: Some improvement in aeration at the left lung base. Residual opacity is presumably atelectasis or pneumonia. Ill-defined opacities persist throughout the left lung, consistent with the multifocal pneumonia better demonstrated on chest CT of 08/09/2017. Additional persistent atelectasis at the left lung base. No pneumothorax. Heart size and mediastinal contours appear stable. IMPRESSION: 1. Improved aeration at the right lung base status post thoracentesis. Residual opacity is presumably atelectasis or pneumonia. No pneumothorax seen. 2. Stable patchy opacities throughout the left lung, compatible with the multifocal pneumonia demonstrated on yesterday's chest CT. Electronically Signed   By: Bary RichardStan  Maynard M.D.   On: 08/10/2017 14:54   [place] Thoracentesis Asp Pleural Space W/img Guide  Result Date: 08/10/2017 INDICATION: Respiratory failure. Large right pleural effusion. Request for diagnostic and therapeutic thoracentesis. EXAM: ULTRASOUND GUIDED RIGHT  THORACENTESIS MEDICATIONS: None. COMPLICATIONS: None immediate. PROCEDURE: An ultrasound guided thoracentesis was thoroughly discussed with the patient and questions answered. The benefits, risks, alternatives and complications were also discussed. The patient understands and wishes to proceed with the procedure. Written consent was obtained. Ultrasound was performed to localize and mark an adequate pocket of fluid in the right chest. The area was then prepped and draped in the normal sterile fashion. 1% Lidocaine was used for local anesthesia. Under ultrasound guidance a Safe-T-Centesis catheter was introduced. Thoracentesis was performed. The catheter was removed and a dressing applied. FINDINGS: A total of approximately 1.4 L of pink/blood-tinged fluid was removed. Samples were sent to the laboratory as requested by the clinical team. IMPRESSION: Successful ultrasound guided right thoracentesis yielding 1.4 L of pleural fluid. Read by: Brayton ElKevin Bruning PA-C Electronically Signed   By: Corlis Leak  Hassell M.D.   On: 08/10/2017 12:55    Cardiac Studies   2D echo 08/2017 Study Conclusions - Left ventricle: The cavity size was normal. Wall thickness was   normal. The estimated ejection fraction was 55%. Wall motion was   normal; there were no regional wall motion abnormalities.   Features are consistent with a pseudonormal left ventricular   filling pattern, with concomitant abnormal relaxation and   increased filling pressure (grade 2 diastolic dysfunction). - Aortic valve: There was no stenosis. There was trivial   regurgitation. - Mitral valve: There appeared to be an anterior leaflet mitral   valve vegetation (not ideally visualized) with severe,   posteriorly-directed mitral regurgitation. - Left atrium: The atrium was moderately dilated. - Right ventricle: The cavity size was normal. Systolic function   was normal. - Tricuspid valve: Peak RV-RA gradient (S): 47 mm Hg. - Pulmonary arteries: PA peak  pressure: 62 mm Hg (S). - Systemic veins: IVC measured 2.13 cm with < 50% respirophasic   variation, suggesting RA pressure 15 mmHg. - Pericardium, extracardiac: A small pericardial effusion was   identified.  Patient Profile     52 y.o. 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  Assessment & Plan    1. Endocarditis with severe MR and acute heart failure - anterior MV leaflet vegetation with mild MR by echo Jan 2019 but now has progressed to severe MR this admit complicated by acute CHF - will set up TEE to evaluate further once his respiratory and mental status have improved and then CVTS consult as he will likely need surgical correction - abx  per primary team/ID  2. Acute heart failure - admitted w/ pulmonary edema and effusions, elevated BNP  - appears to be secondary to secondary to severe MR. If LVEF truly 55% in severe MR would actually suggest his forward cardiac output may actually be decreased - He put out 1.8 L yesterday and is net neg 4 L.   - His weight is down at least 14 lbs from admit - discuss changing Lasix to po - follow renal function closely.  Creatinine w/ mild upward trend at 1.21  3. Anemia - per primary team  4. Pleural effusion/multifocal PNA - 1.4 L removed from right lung.  - follow up lytes criteria, if transudate would support related to CHF - antibx per [facility] - labs are incomplete, but values back are most consistent w/ transudative   For questions or updates, please contact [facility] Please consult [email or web address] for contact info under Cardiology/STEMI.      Signed, Theodore Demark, PA-C  08/12/2017, 8:43 AM

## 2017-08-12 NOTE — Progress Notes (Signed)
Family Medicine Teaching Service Daily Progress Note Intern Pager: [phone]  Patient name: Marcus [name] Medical record number: [id number] Date of birth: 09/22/1965 Age: 52 y.o. Gender: male  Primary Care Provider: Patient, No Pcp Per Consultants: CCM, ID Code Status: Full   Pt Overview and Major Events to Date:  3/09: Admit forsepsis thought to be secondary to HAP and new onsetHF,cultures obtained, broad-spectrum antibiotics initiated 3/10: PCCM consulted,tolerating BiPAP  Assessment and Plan: GRACIN MCPARTLAND a 52 y.o.malepresenting with worsening SOB. 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  Acute hypoxic hypercarbic respiratory failure Septic RLL HAPwith pleural effusion Currently satting at 96% on 2L , patient did not tolerate bipap and tore bipap off overnight. Does have signs of pneumonia on CXR particularly at right base with associated pleural effusion.Given recent hospitalization, meets criteria for healthcare associated pneumonia. Lactic acidosis resolved. US guided right thoracentesis performed on 08/10/17, yielding 1.4L hazy pink/blood tinged fluid; exudative by lights criteria based on LDH. Repeat CXR showing improved aeration of right lung base s/p thoracentesis. RVP negative.  -CCM consulted, appreciate recommendations: have signed off  -Continue supplemental O2 with O2 goal greater than 88%,monitor ABGs -thoracentesis labs pending  -Continue cefepime per pharmacy, pending blood cultures and Legionella and strep pneumo antigen,trending procalcitonin  Chest pain Endocarditis of mitral valve h/oembolic strokeand metabolic encephalopathy new onset heart failure Recently diagnosed with mitral valve endocarditis in January 2019 with methicillin sensitive staph aureus bacteremia and septic left knee arthritis status post aspiration.Patient does have signs concerning for new  onset heart failure given elevated BNP and clinical appearance. Echo on 3/10 showing worsening LVEF to 55%, vegetation in anterior mitral leaflet, and severe mitral regurgiation. Per ID appears to be sterile vegetation. Troponin negative x 3. Net negative -4.040L since admission.  -cardiology consulted, appreciate recommendations: TEE tentivively scheduled for 3/13, NPO at midnight   -daily weight -strict Is and Os  Hypokalemia K of 3.2, previously normal  -replete with KDur as needed  Anemia Current Hgb 9.1. Appears to be chronically low with baseline of 8-9. Normocytic  -continue to monitor  Left knee edema h/o left MSSA septic arthritis Presented with lower extremity edema. Does have recent septic arthritis of the knee. bilateraly lower extremity dopplers negative for DVT.   Hepatitis C genotype 2B Noted during previous hospitalization. No prior treatment. Recent viral load 224K. LFTs stable.  -ID consulted, appreciate recommendations  Compression deformities of mid to lower thoracic spine Patient reports history of fall back in January 2019he continues to have persistent low back pain since discharge. Findings of uncertain age mid to lower thoracic spine compression deformities on CXR. Patient reports use of OxyContin and oxycodone for pain control though no medications per chart review. -Fentanyl 50 mcg every 6 hours as needed,will wean daily  -PT/OT consulted   Polysubstance abuse UDS positive for barbiturates and opiates on presentation. Also has history of cocaine back in January 2019. Has known endocarditis likely from IV drug use though patient reports no recentillicit drug use. Ethanol level within normal limits on presentation. Currently on CIWA and COWS. CIWA elevated overnight to 17>18>18.  -ContinueCIWAprotocol -Daily folate and thiamine -monitor COWS score  -scheduled Ativan given persistently elevated CIWA scores   FEN/GI:heart healthy diet, NPO  prior to TEE  JYN:WGNF  Disposition: continued inpatient stay   Subjective:  Patient states he feels slightly better. States breathing is improved. Reports back pain. Denies chest pain.   Objective: Temp:  [  97.5 F (36.4 C)-99 F (37.2 C)] 99 F (37.2 C) (03/12 1123) Pulse Rate:  [103-113] 103 (03/12 1123) Resp:  [17-28] 28 (03/12 0400) BP: (93-118)/(58-93) 104/61 (03/12 1123) SpO2:  [90 %-96 %] 92 % (03/12 1123) Weight:  [136 lb 0.4 oz (61.7 kg)] 136 lb 0.4 oz (61.7 kg) (03/12 0358) Physical Exam: General: awake and alert, laying in bed, in distress, oriented to persona and place but not time  Cardiovascular: RRR, grade 3 systolic murmur  Respiratory: CTAB, no wheezes, rales or rhonchi  Abdomen: soft, non tender, non distended, bowel sounds normal  Extremities: no edema   Laboratory: Recent Labs  Lab 08/10/17 0449 08/11/17 0913 08/12/17 0423  WBC 12.2* 11.7* 8.8  HGB 8.3* 9.3* 9.1*  HCT 26.5* 30.1* 29.1*  PLT 266 335 301   Recent Labs  Lab 08/10/17 0449 08/11/17 0913 08/12/17 0423  NA 135 137 137  K 4.0 3.6 3.2*  CL 101 100* 96*  CO2 24 26 27   BUN 14 12 13   CREATININE 1.05 1.12 1.21  CALCIUM 8.2* 8.3* 8.1*  PROT 6.2* 6.2* 6.3*  BILITOT 0.7 0.8 0.8  ALKPHOS 121 104 92  ALT 12* 12* 10*  AST 17 15 14*  GLUCOSE 125* 88 107*     Ref. Range 08/10/2017 01:23 08/10/2017 04:49 08/11/2017 09:13  Procalcitonin Latest Units: ng/mL 0.34 0.43 0.40    Imaging/Diagnostic Tests: Dg Chest 2 View  Addendum Date: 08/09/2017   ADDENDUM REPORT: 08/09/2017 15:41 ADDENDUM: After further review, there are compression deformities of 2 adjacent vertebral bodies in the mid to lower thoracic spine, of uncertain age but new compared to an older chest x-ray 02/15/2005. Would consider further characterization with CT. These results were called by telephone at the time of interpretation on 08/09/2017 at 3:40 pm to Dr. Doug SouSAM JACUBOWITZ , who verbally acknowledged these results. Electronically  Signed   By: Bary RichardStan  Maynard M.D.   On: 08/09/2017 15:41   Result Date: 08/09/2017 CLINICAL DATA:  Shortness of breath for 2 days. Bacterial meningitis diagnosed at the beginning of January, than recently diagnosed with pneumonia. History of TIA in January with left-sided weakness. Former smoker. EXAM: CHEST - 2 VIEW COMPARISON:  Chest x-ray dated 06/08/2017. FINDINGS: New dense opacity at the right lung base, likely a combination of consolidation and pleural effusion. Pleural effusion component is at least moderate in size. Patchy opacities are seen within the left perihilar and lower lung zones, favor edema. Additional opacity at the left lung base is likely a combination of atelectasis and small pleural effusion. Heart size and mediastinal contours are stable. Osseous structures about the chest are unremarkable. IMPRESSION: 1. New dense opacity at the right lung base, likely a combination of pneumonia or atelectasis and pleural effusion. The pleural effusion component is at least moderate in size. Would consider chest CT for further characterization. 2. Additional patchy ill-defined opacities within the left mid and lower lung, most likely edema, pneumonia considered less likely. 3. Probable mild atelectasis and/or small pleural effusion at the left lung base. Electronically Signed: By: Bary RichardStan  Maynard M.D. On: 08/09/2017 15:29   Ct Angio Chest Pe W And/or Wo Contrast  Result Date: 08/09/2017 CLINICAL DATA:  SOB x2 days. Pt diagnosed with bacterial meningitis at the beginning of January, then recently diagnosed with pneumonia. Pt also has hx of TIA beginning of January that left him with left sided weakness. Patient had left knee surgery x 6 weeks ago for sepsis. EXAM: CT ANGIOGRAPHY CHEST WITH CONTRAST TECHNIQUE: Multidetector  CT imaging of the chest was performed using the standard protocol during bolus administration of intravenous contrast. Multiplanar CT image reconstructions and MIPs were obtained to  evaluate the vascular anatomy. CONTRAST:  ISOVUE-370 IOPAMIDOL (ISOVUE-370) INJECTION 76% COMPARISON:  Current chest radiographs and prior studies. FINDINGS: Cardiovascular: Satisfactory opacification of the pulmonary arteries to the segmental level. No evidence of pulmonary embolism. Heart is normal in size and configuration. No pericardial effusion. No coronary artery calcifications. The great vessels normal in caliber. No aortic atherosclerosis. No dissection. Mediastinum/Nodes: No neck base or axillary masses or pathologically enlarged lymph nodes. No mediastinal or hilar masses or discrete enlarged lymph nodes. The trachea is patent and normal in caliber. Esophagus is unremarkable. Lungs/Pleura: Large right and small left pleural effusions. Complete atelectasis of the right middle lobe and near complete atelectasis of the right lower lobe. Small area of consolidation in the anterior inferior right upper lobe. Patchy consolidation is noted in a peribronchovascular distribution in the left upper lobe and, to lesser degree, left lower lobe. There is dependent opacity also in the left lower lobe consistent with atelectasis. No pneumothorax. Upper Abdomen: No acute abnormality. Musculoskeletal: There is marked narrowing of the T6-T7 disc space with resorption/irregularity of the lower endplate of T6 in upper endplate of T7, with mild loss of height of these vertebra. This leads to a mild focal kyphosis. This is new since a chest radiograph dated 02/15/2005. A subtle lucency across the T6 spinous process posteriorly suggests a previous fracture. All remaining vertebral bodies are normal in height. No osteoblastic or osteolytic lesions. Review of the MIP images confirms the above findings. IMPRESSION: 1. Patchy areas of consolidation in both lungs consistent with multifocal pneumonia. 2. Complete atelectasis of the right middle lobe and near complete atelectasis of the right lower lobe. Mild dependent  atelectasis in the left lower lobe. 3. Large right and small left pleural effusions. 4. Marked loss of the disc space at T6-C7 with irregular resorption of endplates leading to loss of vertebral body height of T6 and T7 and a focal kyphosis. This may reflect the sequelae of an old fracture. It could be due to previous discitis/osteomyelitis. Electronically Signed   By: Amie Portland M.D.   On: 08/09/2017 18:44   Dg Chest Port 1 View  Result Date: 08/10/2017 CLINICAL DATA:  Post right thora, 1.4 L removed EXAM: PORTABLE CHEST 1 VIEW COMPARISON:  Chest x-ray dated 08/09/2017. FINDINGS: Some improvement in aeration at the left lung base. Residual opacity is presumably atelectasis or pneumonia. Ill-defined opacities persist throughout the left lung, consistent with the multifocal pneumonia better demonstrated on chest CT of 08/09/2017. Additional persistent atelectasis at the left lung base. No pneumothorax. Heart size and mediastinal contours appear stable. IMPRESSION: 1. Improved aeration at the right lung base status post thoracentesis. Residual opacity is presumably atelectasis or pneumonia. No pneumothorax seen. 2. Stable patchy opacities throughout the left lung, compatible with the multifocal pneumonia demonstrated on yesterday's chest CT. Electronically Signed   By: Bary Richard M.D.   On: 08/10/2017 14:54   US Thoracentesis Asp Pleural Space W/img Guide  Result Date: 08/10/2017 INDICATION: Respiratory failure. Large right pleural effusion. Request for diagnostic and therapeutic thoracentesis. EXAM: ULTRASOUND GUIDED RIGHT THORACENTESIS MEDICATIONS: None. COMPLICATIONS: None immediate. PROCEDURE: An ultrasound guided thoracentesis was thoroughly discussed with the patient and questions answered. The benefits, risks, alternatives and complications were also discussed. The patient understands and wishes to proceed with the procedure. Written consent was obtained. Ultrasound was performed to  localize and  mark an adequate pocket of fluid in the right chest. The area was then prepped and draped in the normal sterile fashion. 1% Lidocaine was used for local anesthesia. Under ultrasound guidance a Safe-T-Centesis catheter was introduced. Thoracentesis was performed. The catheter was removed and a dressing applied. FINDINGS: A total of approximately 1.4 L of pink/blood-tinged fluid was removed. Samples were sent to the laboratory as requested by the clinical team. IMPRESSION: Successful ultrasound guided right thoracentesis yielding 1.4 L of pleural fluid. Read by: Brayton El PA-C Electronically Signed   By: Corlis Leak M.D.   On: 08/10/2017 12:55     Oralia Manis, DO 08/12/2017, 2:44 PM PGY-1, [facility] FPTS Intern pager: [phone], text pages welcome

## 2017-08-13 DIAGNOSIS — I5031 Acute diastolic (congestive) heart failure: Secondary | ICD-10-CM

## 2017-08-13 DIAGNOSIS — Y95 Nosocomial condition: Secondary | ICD-10-CM

## 2017-08-13 DIAGNOSIS — J189 Pneumonia, unspecified organism: Secondary | ICD-10-CM

## 2017-08-13 DIAGNOSIS — R5383 Other fatigue: Secondary | ICD-10-CM

## 2017-08-13 DIAGNOSIS — I059 Rheumatic mitral valve disease, unspecified: Secondary | ICD-10-CM

## 2017-08-13 LAB — COMPREHENSIVE METABOLIC PANEL
ALBUMIN: 2.1 g/dL — AB (ref 3.5–5.0)
ALT: 8 U/L — ABNORMAL LOW (ref 17–63)
AST: 14 U/L — ABNORMAL LOW (ref 15–41)
Alkaline Phosphatase: 97 U/L (ref 38–126)
Anion gap: 11 (ref 5–15)
BUN: 13 mg/dL (ref 6–20)
CO2: 29 mmol/L (ref 22–32)
Calcium: 8.3 mg/dL — ABNORMAL LOW (ref 8.9–10.3)
Chloride: 95 mmol/L — ABNORMAL LOW (ref 101–111)
Creatinine, Ser: 1.14 mg/dL (ref 0.61–1.24)
GFR calc Af Amer: >60 mL/min (ref >60)
GFR calc non Af Amer: >60 mL/min (ref >60)
GLUCOSE: 103 mg/dL — AB (ref 65–99)
POTASSIUM: 3.5 mmol/L (ref 3.5–5.1)
SODIUM: 135 mmol/L (ref 135–145)
TOTAL PROTEIN: 6.5 g/dL (ref 6.5–8.1)
Total Bilirubin: 0.5 mg/dL (ref 0.3–1.2)

## 2017-08-13 LAB — CBC
HEMATOCRIT: 29.8 % — AB (ref 39.0–52.0)
HEMOGLOBIN: 9.4 g/dL — AB (ref 13.0–17.0)
MCH: 28.6 pg (ref 26.0–34.0)
MCHC: 31.5 g/dL (ref 30.0–36.0)
MCV: 90.6 fL (ref 78.0–100.0)
Platelets: 293 10*3/uL (ref 150–400)
RBC: 3.29 MIL/uL — AB (ref 4.22–5.81)
RDW: 14.9 % (ref 11.5–15.5)
WBC: 7.1 10*3/uL (ref 4.0–10.5)

## 2017-08-13 MED ORDER — SODIUM CHLORIDE 0.9 % IV SOLN
INTRAVENOUS | Status: DC
  Administered 2017-08-13: 11:00:00 via INTRAVENOUS

## 2017-08-13 MED ORDER — FENTANYL CITRATE (PF) 100 MCG/2ML IJ SOLN: 50.0000 ug | Freq: Two times a day (BID) | INTRAMUSCULAR | Status: DC | PRN

## 2017-08-13 MED ORDER — FENTANYL CITRATE (PF) 100 MCG/2ML IJ SOLN
25.0000 ug | Freq: Four times a day (QID) | INTRAMUSCULAR | Status: DC | PRN
  Administered 2017-08-13 – 2017-08-16 (×10): 25 ug via INTRAVENOUS
  Filled 2017-08-13 (×10): qty 2

## 2017-08-13 MED ORDER — POTASSIUM CHLORIDE CRYS ER 20 MEQ PO TBCR
40.0000 meq | EXTENDED_RELEASE_TABLET | Freq: Once | ORAL | Status: AC
  Administered 2017-08-13: 40 meq via ORAL
  Filled 2017-08-13: qty 2

## 2017-08-13 NOTE — Progress Notes (Signed)
PT Cancellation Note  Patient Details Name: Marcus [name] MRN: [id number] DOB: 01/24/1966   Cancelled Treatment:    Reason Eval/Treat Not Completed: Patient declined, no reason specified.  I've had a bad day, let's do it tomorrow. 08/13/2017  [name], PT [phone]  (pager)   Marcus [name] 08/13/2017, 5:00 PM

## 2017-08-13 NOTE — Progress Notes (Signed)
Family Medicine Teaching Service Daily Progress Note Intern Pager: [phone]  Patient name: Marcus [name] Medical record number: [id number] Date of birth: [birth year] Age: 52 y.o. Gender: male  Primary Care Provider: Patient, No Pcp Per Consultants: CCM, ID Code Status: Full   Pt Overview and Major Events to Date:  3/09: Admit forsepsis thought to be secondary to HAP and new onsetHF,cultures obtained, broad-spectrum antibiotics initiated 3/10: PCCM consulted,tolerating BiPAP 3/10: thoracentesis   Assessment and Plan: Marcus [name] a 52 y.o.malepresenting with worsening SOB. 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  Acute hypoxic hypercarbic respiratory failure Septic RLL HAPwith pleural effusion Currently satting at 100% on 2L [place] Does have signs of pneumonia on CXR particularly at right base with associated pleural effusion.Given recent hospitalization, meets criteria for healthcare associated pneumonia. Lactic acidosis resolved. US guided right thoracentesis performed on 08/10/17, yielding 1.4L hazy pink/blood tinged fluid; exudative by lights criteria based on LDH. Repeat CXR showing improved aeration of right lung base s/p thoracentesis. RVP negative.  -CCM consulted, appreciate recommendations: have signed off  -Continue supplemental O2 with O2 goal greater than 88%,monitor ABGs -thoracentesis labs pending  -ID consulted, appreciate recommendations: will discontinue cefazolin if no growth on blood cx in 1-2 days -continue cefazolin (3/11-) per ID recommendations   Chest pain Endocarditis of mitral valve h/oembolic strokeand metabolic encephalopathy new onset heart failure Recently diagnosed with mitral valve endocarditis in January 2019 with methicillin sensitive staph aureus bacteremia and septic left knee arthritis status post aspiration.Patient does have signs concerning for  new onset heart failure given elevated BNP and clinical appearance. Echo on 3/10 showing worsening LVEF to 55%, vegetation in anterior mitral leaflet, and severe mitral regurgiation. Per ID appears to be sterile vegetation. Troponin negative x 3. Net negative -4.520L since admission.  -cardiology consulted, appreciate recommendations: TEE tentivively scheduled for 3/14, NPO at midnight   -daily weight -strict Is and Os  Hypokalemia-resolved K of 3.5, previously normal  -replete with KDur as needed  Anemia Current Hgb 9.4. Appears to be chronically low with baseline of 8-9. Normocytic  -continue to monitor  Left knee edema h/o left MSSA septic arthritis Presented with lower extremity edema. Does have recent septic arthritis of the knee. bilateraly lower extremity dopplers negative for DVT.   Hepatitis C genotype 2B Noted during previous hospitalization. No prior treatment. Recent viral load 224K. LFTs stable.  -ID consulted, appreciate recommendations  Compression deformities of mid to lower thoracic spine Patient reports history of fall back in January 2019he continues to have persistent low back pain since discharge. Findings of uncertain age mid to lower thoracic spine compression deformities on CXR. Patient reports use of OxyContin and oxycodone for pain control though no medications per chart review. -will wean Fentanyl 25 mcg every 6 hours as needed,will wean daily  -PT/OT consulted    Polysubstance abuse UDS positive for barbiturates and opiates on presentation. Also has history of cocaine back in January 2019. Has known endocarditis likely from IV drug use though patient reports no recentillicit drug use. Ethanol level within normal limits on presentation. Currently on CIWA and COWS. CIWA overnight to 10>14>0. COWS overnight 10>10>8 -ContinueCIWAprotocol -Daily folate and thiamine -monitor COWS score  -scheduled Ativan given persistently elevated CIWA scores    FEN/GI:heart healthy diet, NPO prior to TEE  JYN:WGNF  Disposition: continued inpatient stay   Subjective:  Patient reports feeling better. Mother at bedside. Says breathing is improved. Reports increased back  pain. Reports some chest pain but points diffusely throughout chest.   Objective: Temp:  [98.4 F (36.9 C)] 98.4 F (36.9 C) (03/13 1205) Pulse Rate:  [65-103] 97 (03/13 1739) Resp:  [14-29] 29 (03/13 1739) BP: (89-133)/(64-77) 90/66 (03/13 1739) SpO2:  [92 %-97 %] 92 % (03/13 1739) Weight:  [132 lb 15 oz (60.3 kg)] 132 lb 15 oz (60.3 kg) (03/13 0645) Physical Exam: General: awake and alert, laying in bed, [place] in place Cardiovascular: RRR, grade 3 systolic murmur Respiratory: CTAB, no wheezes, rales, or rhonchi  Abdomen: soft, non tender, non distended, bowel sounds normal  Extremities: no edema, non tender  MSK: no point paraspinal tenderness, diffuse MSK pain   Laboratory: Recent Labs  Lab 08/11/17 0913 08/12/17 0423 08/13/17 0416  WBC 11.7* 8.8 7.1  HGB 9.3* 9.1* 9.4*  HCT 30.1* 29.1* 29.8*  PLT 335 301 293   Recent Labs  Lab 08/11/17 0913 08/12/17 0423 08/13/17 0416  NA 137 137 135  K 3.6 3.2* 3.5  CL 100* 96* 95*  CO2 26 27 29   BUN 12 13 13   CREATININE 1.12 1.21 1.14  CALCIUM 8.3* 8.1* 8.3*  PROT 6.2* 6.3* 6.5  BILITOT 0.8 0.8 0.5  ALKPHOS 104 92 97  ALT 12* 10* 8*  AST 15 14* 14*  GLUCOSE 88 107* 103*     Ref. Range 08/10/2017 01:23 08/10/2017 04:49 08/11/2017 09:13  Procalcitonin Latest Units: ng/mL 0.34 0.43 0.40    Imaging/Diagnostic Tests: Dg Chest 2 View  Addendum Date: 08/09/2017   ADDENDUM REPORT: 08/09/2017 15:41 ADDENDUM: After further review, there are compression deformities of 2 adjacent vertebral bodies in the mid to lower thoracic spine, of uncertain age but new compared to an older chest x-ray 02/15/2005. Would consider further characterization with CT. These results were called by telephone at the time of interpretation on  08/09/2017 at 3:40 pm to Dr. Doug Sou , who verbally acknowledged these results. Electronically Signed   By: Bary Richard M.D.   On: 08/09/2017 15:41   Result Date: 08/09/2017 CLINICAL DATA:  Shortness of breath for 2 days. Bacterial meningitis diagnosed at the beginning of January, than recently diagnosed with pneumonia. History of TIA in January with left-sided weakness. Former smoker. EXAM: CHEST - 2 VIEW COMPARISON:  Chest x-ray dated 06/08/2017. FINDINGS: New dense opacity at the right lung base, likely a combination of consolidation and pleural effusion. Pleural effusion component is at least moderate in size. Patchy opacities are seen within the left perihilar and lower lung zones, favor edema. Additional opacity at the left lung base is likely a combination of atelectasis and small pleural effusion. Heart size and mediastinal contours are stable. Osseous structures about the chest are unremarkable. IMPRESSION: 1. New dense opacity at the right lung base, likely a combination of pneumonia or atelectasis and pleural effusion. The pleural effusion component is at least moderate in size. Would consider chest CT for further characterization. 2. Additional patchy ill-defined opacities within the left mid and lower lung, most likely edema, pneumonia considered less likely. 3. Probable mild atelectasis and/or small pleural effusion at the left lung base. Electronically Signed: By: Bary Richard M.D. On: 08/09/2017 15:29   Ct Angio Chest Pe W And/or Wo Contrast  Result Date: 08/09/2017 CLINICAL DATA:  SOB x2 days. Pt diagnosed with bacterial meningitis at the beginning of January, then recently diagnosed with pneumonia. Pt also has hx of TIA beginning of January that left him with left sided weakness. Patient had left knee surgery  x 6 weeks ago for sepsis. EXAM: CT ANGIOGRAPHY CHEST WITH CONTRAST TECHNIQUE: Multidetector CT imaging of the chest was performed using the standard protocol during bolus  administration of intravenous contrast. Multiplanar CT image reconstructions and MIPs were obtained to evaluate the vascular anatomy. CONTRAST:  100mL ISOVUE-370 IOPAMIDOL (ISOVUE-370) INJECTION 76% COMPARISON:  Current chest radiographs and prior studies. FINDINGS: Cardiovascular: Satisfactory opacification of the pulmonary arteries to the segmental level. No evidence of pulmonary embolism. Heart is normal in size and configuration. No pericardial effusion. No coronary artery calcifications. The great vessels normal in caliber. No aortic atherosclerosis. No dissection. Mediastinum/Nodes: No neck base or axillary masses or pathologically enlarged lymph nodes. No mediastinal or hilar masses or discrete enlarged lymph nodes. The trachea is patent and normal in caliber. Esophagus is unremarkable. Lungs/Pleura: Large right and small left pleural effusions. Complete atelectasis of the right middle lobe and near complete atelectasis of the right lower lobe. Small area of consolidation in the anterior inferior right upper lobe. Patchy consolidation is noted in a peribronchovascular distribution in the left upper lobe and, to lesser degree, left lower lobe. There is dependent opacity also in the left lower lobe consistent with atelectasis. No pneumothorax. Upper Abdomen: No acute abnormality. Musculoskeletal: There is marked narrowing of the T6-T7 disc space with resorption/irregularity of the lower endplate of T6 in upper endplate of T7, with mild loss of height of these vertebra. This leads to a mild focal kyphosis. This is new since a chest radiograph dated 02/15/2005. A subtle lucency across the T6 spinous process posteriorly suggests a previous fracture. All remaining vertebral bodies are normal in height. No osteoblastic or osteolytic lesions. Review of the MIP images confirms the above findings. IMPRESSION: 1. Patchy areas of consolidation in both lungs consistent with multifocal pneumonia. 2. Complete atelectasis of  the right middle lobe and near complete atelectasis of the right lower lobe. Mild dependent atelectasis in the left lower lobe. 3. Large right and small left pleural effusions. 4. Marked loss of the disc space at T6-C7 with irregular resorption of endplates leading to loss of vertebral body height of T6 and T7 and a focal kyphosis. This may reflect the sequelae of an old fracture. It could be due to previous discitis/osteomyelitis. Electronically Signed   By: Amie Portlandavid  Ormond M.D.   On: 08/09/2017 18:44   Dg Chest Port 1 View  Result Date: 08/10/2017 CLINICAL DATA:  Post right thora, 1.4 L removed EXAM: PORTABLE CHEST 1 VIEW COMPARISON:  Chest x-ray dated 08/09/2017. FINDINGS: Some improvement in aeration at the left lung base. Residual opacity is presumably atelectasis or pneumonia. Ill-defined opacities persist throughout the left lung, consistent with the multifocal pneumonia better demonstrated on chest CT of 08/09/2017. Additional persistent atelectasis at the left lung base. No pneumothorax. Heart size and mediastinal contours appear stable. IMPRESSION: 1. Improved aeration at the right lung base status post thoracentesis. Residual opacity is presumably atelectasis or pneumonia. No pneumothorax seen. 2. Stable patchy opacities throughout the left lung, compatible with the multifocal pneumonia demonstrated on yesterday's chest CT. Electronically Signed   By: Bary RichardStan  Maynard M.D.   On: 08/10/2017 14:54   [place] Thoracentesis Asp Pleural Space W/img Guide  Result Date: 08/10/2017 INDICATION: Respiratory failure. Large right pleural effusion. Request for diagnostic and therapeutic thoracentesis. EXAM: ULTRASOUND GUIDED RIGHT THORACENTESIS MEDICATIONS: None. COMPLICATIONS: None immediate. PROCEDURE: An ultrasound guided thoracentesis was thoroughly discussed with the patient and questions answered. The benefits, risks, alternatives and complications were also discussed. The patient understands and  wishes to proceed  with the procedure. Written consent was obtained. Ultrasound was performed to localize and mark an adequate pocket of fluid in the right chest. The area was then prepped and draped in the normal sterile fashion. 1% Lidocaine was used for local anesthesia. Under ultrasound guidance a Safe-T-Centesis catheter was introduced. Thoracentesis was performed. The catheter was removed and a dressing applied. FINDINGS: A total of approximately 1.4 L of pink/blood-tinged fluid was removed. Samples were sent to the laboratory as requested by the clinical team. IMPRESSION: Successful ultrasound guided right thoracentesis yielding 1.4 L of pleural fluid. Read by: Brayton El PA-C Electronically Signed   By: Corlis Leak M.D.   On: 08/10/2017 12:55     Oralia Manis, DO 08/13/2017, 5:49 PM PGY-1, [facility] FPTS Intern pager: [phone], text pages welcome

## 2017-08-13 NOTE — Progress Notes (Signed)
[facility]  Date of Admission:  08/09/2017            ASSESSMENT/PLAN  MSSA endocarditis with severe mitral valve regurgitation -per cardiology mental status improved today and will be scheduled for TEE tomorrow.  Cultures remain in process with no growth to date.  If cultures are negative recommend discontinuation of cefazolin.  Polysubstance abuse - Continues to have elevated CIWA with improving COWS. Continue treatment per primary team.   Chronic Hepatitis C without coma - Genotype 2b with viral load of 224,000. AST 17 and ALT 12. No treatment is necessary at this time and would recommend outpatient treatment.   Acute Heart Failure - Awaiting TEE and evaluation for surgical intervention of mitral valve. Cardiology following.    Active Problems:   Sepsis (HCC)   Hepatitis C antibody positive in blood   Bacterial endocarditis   Acute heart failure (HCC)   Pleural effusion on right   Severe mitral regurgitation   . folic acid  1 mg Oral Daily  . LORazepam  0-4 mg Intravenous Q6H   Followed by  . LORazepam  0-4 mg Intravenous Q12H  . multivitamin with minerals  1 tablet Oral Daily  . mupirocin ointment   Nasal BID  . potassium chloride  40 mEq Oral Once  . thiamine  100 mg Oral Daily   Or  . thiamine  100 mg Intravenous Daily    SUBJECTIVE:  Afebrile overnight with no leukocytosis.  Currently on cefazolin.  AFB smear negative.  No organisms seen on Gram stain with pleural fluid culture with no growth and no fungus to date.  Blood cultures from 08/10/17 remain with no growth to date.  Awaiting TEE once mental status improves.  Feeling better today. Has some nausea that comes and goes. Slightly restless. Mom is present in room today.    No Known Allergies   Review of Systems: Review of Systems  Constitutional: Negative for chills and fever.  Respiratory: Negative for cough, shortness of breath and wheezing.   Cardiovascular: Negative for  chest pain and leg swelling.  Skin: Negative for rash.  Neurological: Negative for weakness and headaches.      OBJECTIVE: Vitals:   08/13/17 0013 08/13/17 0400 08/13/17 0645 08/13/17 0831  BP: 133/74 112/74  105/66  Pulse: 100 98  97  Resp: 14   (!) 23  Temp:    98.4 F (36.9 C)  TempSrc: Oral   Oral  SpO2: 96%   94%  Weight:   132 lb 15 oz (60.3 kg)   Height:       Body mass index is 18.54 kg/m.  Physical Exam  Constitutional: No distress.  Lying in bed - lethargic but more interactive.   Cardiovascular: Normal rate, regular rhythm and intact distal pulses.  Murmur heard. Pulmonary/Chest: Effort normal and breath sounds normal. No respiratory distress. He has no wheezes. He has no rales. He exhibits no tenderness.  Abdominal: Soft. Bowel sounds are normal. He exhibits no distension. There is no tenderness.  Skin: Skin is warm and dry. No rash noted.    Lab Results Lab Results  Component Value Date   WBC 7.1 08/13/2017   HGB 9.4 (L) 08/13/2017   HCT 29.8 (L) 08/13/2017   MCV 90.6 08/13/2017   PLT 293 08/13/2017    Lab Results  Component Value Date   CREATININE 1.14 08/13/2017   BUN 13 08/13/2017   NA 135 08/13/2017   K 3.5 08/13/2017  CL 95 (L) 08/13/2017   CO2 29 08/13/2017    Lab Results  Component Value Date   ALT 8 (L) 08/13/2017   AST 14 (L) 08/13/2017   ALKPHOS 97 08/13/2017   BILITOT 0.5 08/13/2017     Microbiology: Recent Results (from the past 240 hour(s))  Culture, blood (Routine x 2)     Status: None (Preliminary result)   Collection Time: 08/09/17  2:25 PM  Result Value Ref Range Status   Specimen Description BLOOD LEFT WRIST  Final   Special Requests IN PEDIATRIC BOTTLE Blood Culture adequate volume  Final   Culture   Final    NO GROWTH 3 DAYS Performed at [facility], [address] [place], [place] [address]    Report Status PENDING  Incomplete  Culture, blood (Routine x 2)     Status: None (Preliminary result)    Collection Time: 08/09/17  2:48 PM  Result Value Ref Range Status   Specimen Description BLOOD RIGHT HAND  Final   Special Requests IN PEDIATRIC BOTTLE Blood Culture adequate volume  Final   Culture   Final    NO GROWTH 3 DAYS Performed at [facility], [address] [place], [place] [address]    Report Status PENDING  Incomplete  Urine culture     Status: Abnormal   Collection Time: 08/09/17  3:50 PM  Result Value Ref Range Status   Specimen Description URINE, CLEAN CATCH  Final   Special Requests   Final    NONE Performed at [facility], [address] [place], [place] [address]    Culture MULTIPLE SPECIES PRESENT, SUGGEST RECOLLECTION (A)  Final   Report Status 08/10/2017 FINAL  Final  Respiratory Panel by PCR     Status: None   Collection Time: 08/09/17  4:00 PM  Result Value Ref Range Status   Adenovirus NOT DETECTED NOT DETECTED Final   Coronavirus 229E NOT DETECTED NOT DETECTED Final   Coronavirus HKU1 NOT DETECTED NOT DETECTED Final   Coronavirus NL63 NOT DETECTED NOT DETECTED Final   Coronavirus OC43 NOT DETECTED NOT DETECTED Final   Metapneumovirus NOT DETECTED NOT DETECTED Final   Rhinovirus / Enterovirus NOT DETECTED NOT DETECTED Final   Influenza A NOT DETECTED NOT DETECTED Final   Influenza B NOT DETECTED NOT DETECTED Final   Parainfluenza Virus 1 NOT DETECTED NOT DETECTED Final   Parainfluenza Virus 2 NOT DETECTED NOT DETECTED Final   Parainfluenza Virus 3 NOT DETECTED NOT DETECTED Final   Parainfluenza Virus 4 NOT DETECTED NOT DETECTED Final   Respiratory Syncytial Virus NOT DETECTED NOT DETECTED Final   Bordetella pertussis NOT DETECTED NOT DETECTED Final   Chlamydophila pneumoniae NOT DETECTED NOT DETECTED Final   Mycoplasma pneumoniae NOT DETECTED NOT DETECTED Final    Comment: Performed at [facility], [address] [place], [place] [address]  Culture, blood (routine x 2) Call MD if unable to obtain prior to antibiotics being given      Status: None (Preliminary result)   Collection Time: 08/10/17  1:10 AM  Result Value Ref Range Status   Specimen Description BLOOD RIGHT HAND  Final   Special Requests IN PEDIATRIC BOTTLE Blood Culture adequate volume  Final   Culture   Final    NO GROWTH 2 DAYS Performed at [facility], [address] [place], [place] [address]    Report Status PENDING  Incomplete  Culture, blood (routine x 2) Call MD  if unable to obtain prior to antibiotics being given     Status: None (Preliminary result)   Collection Time: 08/10/17  1:20 AM  Result Value Ref Range Status   Specimen Description BLOOD RIGHT ARM  Final   Special Requests   Final    BOTTLES DRAWN AEROBIC AND ANAEROBIC Blood Culture adequate volume   Culture   Final    NO GROWTH 2 DAYS Performed at [facility], [address], [place], [place] [address]    Report Status PENDING  Incomplete  Acid Fast Smear (AFB)     Status: None   Collection Time: 08/10/17 12:59 PM  Result Value Ref Range Status   AFB Specimen Processing Concentration  Final   Acid Fast Smear Negative  Final    Comment: (NOTE) Performed At: [facility] [address] [place], [place] [address] Jolene Schimke MD XB:[id number]    Source (AFB) PLEURAL  Final    Comment: RIGHT Performed at [facility], [address] [place], [place] [address]   Culture, body fluid-bottle     Status: None (Preliminary result)   Collection Time: 08/10/17 12:59 PM  Result Value Ref Range Status   Specimen Description PLEURAL RIGHT  Final   Special Requests NONE  Final   Culture   Final    NO GROWTH 2 DAYS Performed at [facility], [address] [place], [place] [address]    Report Status PENDING  Incomplete  Gram stain     Status: None   Collection Time: 08/10/17 12:59 PM  Result Value Ref Range Status   Specimen Description PLEURAL RIGHT  Final   Special Requests NONE  Final   Gram Stain   Final    FEW WBC PRESENT,BOTH PMN AND  MONONUCLEAR NO ORGANISMS SEEN Performed at [facility], [address] [place], [place] [address]    Report Status 08/10/2017 FINAL  Final  MRSA PCR Screening     Status: Abnormal   Collection Time: 08/11/17  6:18 AM  Result Value Ref Range Status   MRSA by PCR POSITIVE (A) NEGATIVE Final    Comment:        The GeneXpert MRSA Assay (FDA approved for NASAL specimens only), is one component of a comprehensive MRSA colonization surveillance program. It is not intended to diagnose MRSA infection nor to guide or monitor treatment for MRSA infections. RESULT CALLED TO, READ BACK BY AND VERIFIED WITH: RN Osa Craver [id number] [phone] MLM Performed at [facility], [address] [place], [place] [address]      Marcos Eke, NP [facility] [phone] Pager  08/13/2017  9:24 AM

## 2017-08-13 NOTE — Progress Notes (Signed)
Patient's mother has signed consent for TEE scheduled 3/14. Placed in chart.

## 2017-08-13 NOTE — Progress Notes (Signed)
Progress Note  Patient Name: Marcus [name] Date of Encounter: 08/13/2017  Primary Cardiologist: No primary care provider on file.   Subjective   Denies any chest pain or SOB  Inpatient Medications    Scheduled Meds: . folic acid  1 mg Oral Daily  . LORazepam  0-4 mg Intravenous Q6H   Followed by  . LORazepam  0-4 mg Intravenous Q12H  . multivitamin with minerals  1 tablet Oral Daily  . mupirocin ointment   Nasal BID  . potassium chloride  40 mEq Oral Once  . thiamine  100 mg Oral Daily   Or  . thiamine  100 mg Intravenous Daily   Continuous Infusions: .  ceFAZolin (ANCEF) IV Stopped (08/13/17 0317)   PRN Meds: acetaminophen, albuterol, fentaNYL (SUBLIMAZE) injection, LORazepam **OR** LORazepam   Vital Signs    Vitals:   08/13/17 0013 08/13/17 0400 08/13/17 0645 08/13/17 0831  BP: 133/74 112/74  105/66  Pulse: 100 98  97  Resp: 14   (!) 23  Temp:    98.4 F (36.9 C)  TempSrc: Oral   Oral  SpO2: 96%   94%  Weight:   132 lb 15 oz (60.3 kg)   Height:        Intake/Output Summary (Last 24 hours) at 08/13/2017 0845 Last data filed at 08/13/2017 0839 Gross per 24 hour  Intake 1020 ml  Output 1950 ml  Net -930 ml   Filed Weights   08/11/17 0459 08/12/17 0358 08/13/17 0645  Weight: 145 lb 8.1 oz (66 kg) 136 lb 0.4 oz (61.7 kg) 132 lb 15 oz (60.3 kg)    Telemetry    NSR - Personally Reviewed  ECG    No new EKG to review - Personally Reviewed  Physical Exam   GEN: No acute distress.   Neck: No JVD Cardiac: RRR, no rubs, or gallops. 2/6 SM at LLSB to apex Respiratory: Clear to auscultation bilaterally. GI: Soft, nontender, non-distended  MS: No edema; No deformity. Neuro:  Nonfocal  Psych: Normal affect   Labs    Chemistry Recent Labs  Lab 08/11/17 0913 08/12/17 0423 08/13/17 0416  NA 137 137 135  K 3.6 3.2* 3.5  CL 100* 96* 95*  CO2 26 27 29   GLUCOSE 88 107* 103*  BUN 12 13 13   CREATININE 1.12 1.21 1.14  CALCIUM 8.3* 8.1* 8.3*  PROT  6.2* 6.3* 6.5  ALBUMIN 2.0* 2.1* 2.1*  AST 15 14* 14*  ALT 12* 10* 8*  ALKPHOS 104 92 97  BILITOT 0.8 0.8 0.5  GFRNONAA >60 >60 >60  GFRAA >60 >60 >60  ANIONGAP 11 14 11      Hematology Recent Labs  Lab 08/11/17 0913 08/12/17 0423 08/13/17 0416  WBC 11.7* 8.8 7.1  RBC 3.31* 3.22* 3.29*  HGB 9.3* 9.1* 9.4*  HCT 30.1* 29.1* 29.8*  MCV 90.9 90.4 90.6  MCH 28.1 28.3 28.6  MCHC 30.9 31.3 31.5  RDW 15.0 14.9 14.9  PLT 335 301 293    Cardiac Enzymes Recent Labs  Lab 08/10/17 1041 08/10/17 1709 08/11/17 0913  TROPONINI <0.03 <0.03 <0.03    Recent Labs  Lab 08/09/17 1615  TROPIPOC 0.00     BNP Recent Labs  Lab 08/09/17 1607  BNP 449.0*     DDimer  Recent Labs  Lab 08/09/17 1607  DDIMER 4.67*     Radiology    No results found.  Cardiac Studies   2D echo 08/10/2017 Study Conclusions  - Left ventricle:  The cavity size was normal. Wall thickness was   normal. The estimated ejection fraction was 55%. Wall motion was   normal; there were no regional wall motion abnormalities.   Features are consistent with a pseudonormal left ventricular   filling pattern, with concomitant abnormal relaxation and   increased filling pressure (grade 2 diastolic dysfunction). - Aortic valve: There was no stenosis. There was trivial   regurgitation. - Mitral valve: There appeared to be an anterior leaflet mitral   valve vegetation (not ideally visualized) with severe,   posteriorly-directed mitral regurgitation. - Left atrium: The atrium was moderately dilated. - Right ventricle: The cavity size was normal. Systolic function   was normal. - Tricuspid valve: Peak RV-RA gradient (S): 47 mm Hg. - Pulmonary arteries: PA peak pressure: 62 mm Hg (S). - Systemic veins: IVC measured 2.13 cm with < 50% respirophasic   variation, suggesting RA pressure 15 mmHg. - Pericardium, extracardiac: A small pericardial effusion was   identified.  Impressions:  - Normal LV size with  EF 55%. Moderate diastolic dysfunction.   Normal RV size and systolic function. There appeared to be a   vegetation on the anterior mitral leaflet (though not   well-visualized) with severe, posteriorly-directed mitral   regurgitation. Moderate pulmonary hypertension. Recommend TEE.  Patient Profile     52 y.o. 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  Assessment & Plan    1. 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 -Mental status has improved to the point I think he is okay to undergo TEE tomorrow and we have added into the schedule -We will make n.p.o. after midnight -His mother is going to sign a consent  2. Acute heart failure - pulmonary edema and effusions, elevated BNP  - appears to be secondary to secondary to severe MR. If LVEF truly 55% in severe MR would actually suggest his forward cardiac output may actually be decreased - He put out 1.9 L yesterday and is net neg 4.97 L.  His weight is down24lbs from admit -He appears euvolemic - lasix held due to soft BP -Creatinine is stable at 1.14 and BUN 13.  3. Anemia - per primary team -Hemoglobin stable at 9.4  4. Pleural effusion/multifocal PNA - 1.4 L removed from right lung.  [facility]   For questions or updates, please contact [facility] Please consult [email or web address] for contact info under Cardiology/STEMI.      Signed, Armanda Magicraci Mercedies Ganesh, MD  08/13/2017, 8:45 AM

## 2017-08-14 ENCOUNTER — Other Ambulatory Visit: Payer: Self-pay | Admitting: *Deleted

## 2017-08-14 ENCOUNTER — Inpatient Hospital Stay (HOSPITAL_COMMUNITY): Payer: Medicaid Other

## 2017-08-14 ENCOUNTER — Inpatient Hospital Stay (HOSPITAL_COMMUNITY): Payer: Medicaid Other | Admitting: Certified Registered Nurse Anesthetist

## 2017-08-14 ENCOUNTER — Encounter (HOSPITAL_COMMUNITY): Payer: Self-pay | Admitting: *Deleted

## 2017-08-14 ENCOUNTER — Encounter (HOSPITAL_COMMUNITY)
Admission: EM | Disposition: A | Discharge status: Home Health | Payer: Self-pay | Source: Home / Self Care | Attending: Family Medicine

## 2017-08-14 DIAGNOSIS — I340 Nonrheumatic mitral (valve) insufficiency: Secondary | ICD-10-CM

## 2017-08-14 DIAGNOSIS — R7881 Bacteremia: Secondary | ICD-10-CM

## 2017-08-14 DIAGNOSIS — I38 Endocarditis, valve unspecified: Secondary | ICD-10-CM

## 2017-08-14 HISTORY — PX: TEE WITHOUT CARDIOVERSION: SHX5443

## 2017-08-14 LAB — PROTEIN, PLEURAL OR PERITONEAL FLUID: Total protein, fluid: <3 g/dL

## 2017-08-14 LAB — COMPREHENSIVE METABOLIC PANEL
AST: 16 U/L (ref 15–41)
Albumin: 2.2 g/dL — ABNORMAL LOW (ref 3.5–5.0)
Alkaline Phosphatase: 96 U/L (ref 38–126)
Anion gap: 9 (ref 5–15)
BUN: 16 mg/dL (ref 6–20)
CALCIUM: 8.4 mg/dL — AB (ref 8.9–10.3)
CO2: 28 mmol/L (ref 22–32)
Chloride: 99 mmol/L — ABNORMAL LOW (ref 101–111)
Creatinine, Ser: 1.07 mg/dL (ref 0.61–1.24)
GFR calc non Af Amer: >60 mL/min (ref >60)
GLUCOSE: 98 mg/dL (ref 65–99)
Potassium: 3.9 mmol/L (ref 3.5–5.1)
SODIUM: 136 mmol/L (ref 135–145)
Total Bilirubin: 0.7 mg/dL (ref 0.3–1.2)
Total Protein: 6.8 g/dL (ref 6.5–8.1)

## 2017-08-14 LAB — CBC
HCT: 30.7 % — ABNORMAL LOW (ref 39.0–52.0)
Hemoglobin: 9.7 g/dL — ABNORMAL LOW (ref 13.0–17.0)
MCH: 28.7 pg (ref 26.0–34.0)
MCHC: 31.6 g/dL (ref 30.0–36.0)
MCV: 90.8 fL (ref 78.0–100.0)
Platelets: 323 10*3/uL (ref 150–400)
RBC: 3.38 MIL/uL — AB (ref 4.22–5.81)
RDW: 14.9 % (ref 11.5–15.5)
WBC: 7.3 10*3/uL (ref 4.0–10.5)

## 2017-08-14 LAB — CULTURE, BLOOD (ROUTINE X 2)
Culture: NO GROWTH
Culture: NO GROWTH
SPECIAL REQUESTS: ADEQUATE
Special Requests: ADEQUATE

## 2017-08-14 LAB — LEGIONELLA PNEUMOPHILA SEROGP 1 UR AG: L. pneumophila Serogp 1 Ur Ag: NEGATIVE

## 2017-08-14 SURGERY — ECHOCARDIOGRAM, TRANSESOPHAGEAL
Anesthesia: Monitor Anesthesia Care

## 2017-08-14 MED ORDER — PROPOFOL 10 MG/ML IV BOLUS
INTRAVENOUS | Status: DC | PRN
  Administered 2017-08-14: 30 mg via INTRAVENOUS

## 2017-08-14 MED ORDER — ENSURE ENLIVE PO LIQD
237.0000 mL | Freq: Two times a day (BID) | ORAL | Status: DC
  Administered 2017-08-15: 237 mL via ORAL

## 2017-08-14 MED ORDER — PROMETHAZINE HCL 25 MG/ML IJ SOLN
12.5000 mg | Freq: Once | INTRAMUSCULAR | Status: AC | PRN
  Administered 2017-08-14: 12.5 mg via INTRAVENOUS
  Filled 2017-08-14: qty 1

## 2017-08-14 MED ORDER — LORAZEPAM 0.5 MG PO TABS
0.5000 mg | ORAL_TABLET | Freq: Three times a day (TID) | ORAL | Status: DC
  Administered 2017-08-14 – 2017-08-15 (×4): 0.5 mg via ORAL
  Filled 2017-08-14 (×4): qty 1

## 2017-08-14 MED ORDER — LIDOCAINE HCL (CARDIAC) 20 MG/ML IV SOLN
INTRAVENOUS | Status: DC | PRN
  Administered 2017-08-14: 30 mg via INTRAVENOUS

## 2017-08-14 MED ORDER — BUTAMBEN-TETRACAINE-BENZOCAINE 2-2-14 % EX AERO
INHALATION_SPRAY | CUTANEOUS | Status: DC | PRN
  Administered 2017-08-14: 1 via TOPICAL

## 2017-08-14 MED ORDER — PROPOFOL 500 MG/50ML IV EMUL
INTRAVENOUS | Status: DC | PRN
  Administered 2017-08-14: 150 ug/kg/min via INTRAVENOUS

## 2017-08-14 MED ORDER — IOPAMIDOL (ISOVUE-370) INJECTION 76%
INTRAVENOUS | Status: AC
  Administered 2017-08-14: 100 mL
  Filled 2017-08-14: qty 100

## 2017-08-14 NOTE — Consult Note (Addendum)
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  ------------------------------------------------------------------- History:   PMH:  Septic Embolism. Bacteremia. Endocarditis of Mitral Valve. Polysubstance Abuse.  ------------------------------------------------------------------- Study Conclusions  - Left ventricle: The cavity size was normal. Wall thickness was   normal. The estimated ejection fraction was 55%. Wall motion was   normal; there were no regional wall motion abnormalities.   Features are consistent with a pseudonormal left ventricular   filling pattern, with concomitant  abnormal relaxation and   increased filling pressure (grade 2 diastolic dysfunction). - Aortic valve: There was no stenosis. There was trivial   regurgitation. - Mitral valve: There appeared to be an anterior leaflet mitral   valve vegetation (not ideally visualized) with severe,   posteriorly-directed mitral regurgitation. - Left atrium: The atrium was moderately dilated. - Right ventricle: The cavity size was normal. Systolic function   was normal. - Tricuspid valve: Peak RV-RA gradient (S): 47 mm Hg. - Pulmonary arteries: PA peak pressure: 62 mm Hg (S). - Systemic veins: IVC measured 2.13 cm with < 50% respirophasic   variation, suggesting RA pressure 15 mmHg. - Pericardium, extracardiac: A small pericardial effusion was   identified.  Impressions:  - Normal LV size with EF 55%. Moderate diastolic dysfunction.   Normal RV size and systolic function. There appeared to be a   vegetation on the anterior mitral leaflet (though not   well-visualized) with severe, posteriorly-directed mitral   regurgitation. Moderate pulmonary hypertension. Recommend TEE.  ------------------------------------------------------------------- Study data:  Comparison was made to the study of 06/09/2017.  Study status:  Routine.  Procedure:  The patient was unable to respond to pain level query due to clinical condition. Transthoracic echocardiography. Image quality was adequate.  Study completion: There were no complications.          Transthoracic echocardiography.  M-mode, complete 2D, spectral Doppler, and color Doppler.  Birthdate:  Patient birthdate: 1966-02-04.  Age:  Patient is 52 yr old.  Sex:  Gender: male.    BMI: 20.9 kg/m^2.  Blood pressure:     101/75  Patient status:  Inpatient.  Study date: Study date: 08/10/2017. Study time: 10:37  AM.  Location:  Bedside.    -------------------------------------------------------------------  ------------------------------------------------------------------- Left ventricle:  The cavity size was normal. Wall thickness was normal. The estimated ejection fraction was 55%. Wall motion was normal; there were no regional wall motion abnormalities. Features are consistent with a pseudonormal left ventricular filling pattern, with concomitant abnormal relaxation and increased filling pressure (grade 2 diastolic dysfunction).  ------------------------------------------------------------------- Aortic valve:   Trileaflet.  Doppler:   There was no stenosis. There was trivial regurgitation.  ------------------------------------------------------------------- Aorta:  Aortic root: The aortic root was normal in size. Ascending aorta: The ascending aorta was normal in size.  ------------------------------------------------------------------- Mitral valve:  There appeared to be an anterior leaflet mitral valve vegetation (not ideally visualized) with severe, posteriorly-directed mitral regurgitation.  Doppler:   There was no evidence for stenosis.      Valve area by pressure half-time: 4.23 cm^2. Indexed valve area by pressure half-time: 2.3 cm^2/m^2. Peak gradient (D): 10 mm Hg.  ------------------------------------------------------------------- Left atrium:  The atrium was moderately dilated.  ------------------------------------------------------------------- Right ventricle:  The cavity size was normal. Systolic function was normal.  ------------------------------------------------------------------- Pulmonic valve:    Structurally normal valve.   Cusp separation was normal.  Doppler:  Transvalvular velocity was within the normal range. There was trivial regurgitation.  ------------------------------------------------------------------- Tricuspid valve:   Doppler:  There was trivial regurgitation.    ------------------------------------------------------------------- Right atrium:  The atrium was normal in size.  ------------------------------------------------------------------- Pericardium:  Pleural effusion noted. A small pericardial effusion was identified.  ------------------------------------------------------------------- Systemic veins:  IVC measured 2.13 cm with < 50% respirophasic variation, suggesting RA pressure 15 mmHg.  ------------------------------------------------------------------- Measurements   Left ventricle                           Value          Reference  LV ID, ED, PLAX chordal                  47.1  mm       43 - 52  LV ID, ES, PLAX chordal                  36.2  mm       23 - 38  LV fx shortening, PLAX chordal   (L)     23    %        >=29  LV PW thickness, ED                      11.1  mm       ----------  IVS/LV PW ratio, ED                      0.83           <=1.3  LV e&', lateral                           11.5  cm/s     ----------  LV E/e&', lateral                         13.91          ----------  LV e&', medial                            5.44  cm/s     ----------  LV E/e&', medial                          29.41          ----------  LV e&', average                           8.47  cm/s     ----------  LV E/e&', average                         18.89          ----------    Ventricular septum                       Value          Reference  IVS thickness, ED                        9.19  mm       ----------    LVOT                                     Value          Reference  LVOT ID, S                               20    mm       ----------  LVOT area                                3.14  cm^2     ----------    Aortic valve                             Value          Reference  Aortic regurg pressure half-time         541   ms       ----------    Aorta                                    Value          Reference  Aortic root ID, ED                        31    mm       ----------  Ascending aorta ID, A-P, S               28    mm       ----------    Left atrium                              Value          Reference  LA ID, A-P, ES                           50  mm       ----------  LA ID/bsa, A-P                   (H)     2.72  cm/m^2   <=2.2  LA volume, S                             75.9  ml       ----------  LA volume/bsa, S                         41.2  ml/m^2   ----------  LA volume, ES, 1-p A4C                   85.6  ml       ----------  LA volume/bsa, ES, 1-p A4C               46.5  ml/m^2   ----------  LA volume, ES, 1-p A2C                   64.6  ml       ----------  LA volume/bsa, ES, 1-p A2C               35.1  ml/m^2   ----------    Mitral valve                             Value          Reference  Mitral E-wave peak velocity              160   cm/s     ----------  Mitral A-wave peak velocity              97.3  cm/s     ----------  Mitral deceleration time                 176   ms       150 - 230  Mitral pressure half-time                52    ms       ----------  Mitral peak gradient, D                  10    mm Hg    ----------  Mitral E/A ratio, peak                   1.6            ----------  Mitral valve area, PHT, DP               4.23  cm^2     ----------  Mitral valve area/bsa, PHT, DP           2.3   cm^2/m^2 ----------  Mitral maximal regurg velocity,          575   cm/s     ----------  PISA  Mitral regurg VTI, PISA                  123   cm       ----------  Mitral ERO, PISA  0.11  cm^2     ----------  Mitral regurg volume, PISA               14    ml       ----------    Pulmonary arteries                       Value          Reference  PA pressure, S, DP               (H)     62    mm Hg    <=30    Tricuspid valve                          Value          Reference  Tricuspid regurg peak velocity           341   cm/s     ----------  Tricuspid peak RV-RA gradient            47     mm Hg    ----------    Right atrium                             Value          Reference  RA ID, S-I, ES, A4C                      39.1  mm       34 - 49  RA area, ES, A4C                         10.5  cm^2     8.3 - 19.5  RA volume, ES, A/L                       23.9  ml       ----------  RA volume/bsa, ES, A/L                   13    ml/m^2   ----------    Systemic veins                           Value          Reference  Estimated CVP                            15    mm Hg    ----------    Right ventricle                          Value          Reference  RV ID, minor axis, ED, A4C base          34.3  mm       ----------  RV ID, minor axis, ED, A4C mid           26.2  mm       ----------  RV ID, major axis, ED, A4C               77.8  mm  55 - 91  TAPSE                                    18.2  mm       ----------  RV pressure, S, DP               (H)     62    mm Hg    <=30  RV s&', lateral, S                        10.9  cm/s     ----------    Pulmonic valve                           Value          Reference  Pulmonic regurg velocity, ED             162   cm/s     ----------  Pulmonic regurg gradient, ED             10    mm Hg    ----------  Legend: (L)  and  (H)  mark values outside specified reference range.  ------------------------------------------------------------------- Prepared and Electronically Authenticated by  Marca Ancona, M.D. 2019-03-10T13:06:43   TRANSESOPHAGEAL ECHOCARDIOGRAM (TEE) NOTE  INDICATIONS: infective endocarditis  PROCEDURE:   Informed consent was obtained prior to the procedure. The risks, benefits and alternatives for the procedure were discussed and the patient comprehended these risks.  Risks include, but are not limited to, cough, sore throat, vomiting, nausea, somnolence, esophageal and stomach trauma or perforation, bleeding, low blood pressure, aspiration, pneumonia, infection, trauma to the teeth and death.    After a  procedural time-out, the patient was given propofol per anesthesia for sedation.  The patient's heart rate, blood pressure, and oxygen saturation are monitored continuously during the procedure.The oropharynx was anesthetized with 2 cetacaine sprays.  The transesophageal probe was inserted in the esophagus and stomach without difficulty and multiple views were obtained.  The patient was kept under observation until the patient left the procedure room.  The patient left the procedure room in stable condition.   Agitated microbubble saline contrast was administered.  COMPLICATIONS:    There were no immediate complications.  Findings:  1. LEFT VENTRICLE: The left ventricular wall thickness is mildly increased.  The left ventricular cavity is normal in size. Wall motion is normal.  LVEF is 60-65%.  2. RIGHT VENTRICLE:  The right ventricle is normal in structure and function without any thrombus or masses.    3. LEFT ATRIUM:  The left atrium is mildly dilated in size without any thrombus or masses.  There is not spontaneous echo contrast ("smoke") in the left atrium consistent with a low flow state.  4. LEFT ATRIAL APPENDAGE:  The left atrial appendage is free of any thrombus or masses. The appendage has single lobes. Pulse doppler indicates high flow in the appendage.  5. ATRIAL SEPTUM:  The atrial septum appears intact and is free of thrombus and/or masses.  There is no evidence for interatrial shunting by color doppler and saline microbubble.  6. RIGHT ATRIUM:  The right atrium is dilated without any thrombus or masses.  7. MITRAL VALVE:  The mitral valve is thickened with a mobile mass noted on the anterior leaflet - flail cord is also  noted. There is a ~0.5 cm round perforation of the anterior leaflet with  Severe regurgitation both through the perforation and the malcoapted valve leaflets. This was visualized well in 2D and 3D views.   8. AORTIC VALVE:  The aortic valve is  trileaflet, somewhat thickened, but no obvious vegetations. There is trivial regurgitation.  There were no vegetations or stenosis  9. TRICUSPID VALVE:  The tricuspid valve is normal in structure and function with Mild regurgitation.  There were no vegetations or stenosis  10.  PULMONIC VALVE:  The pulmonic valve is normal in structure and function with trivial regurgitation.  There were no vegetations or stenosis.   11. AORTIC ARCH, ASCENDING AND DESCENDING AORTA:  There was no Myrtis Ser et. Al, 1992) atherosclerosis of the ascending aorta, aortic arch, or proximal descending aorta.  12. PULMONARY VEINS: Anomalous pulmonary venous return was not noted.  13. PERICARDIUM: The pericardium appeared normal and non-thickened.  There is a Minimal pericardial effusion.  IMPRESSION:   1. Endocarditis of the mitral valve, with mass and partially flail anterior leaflet, flail cord and also a large 0.5 cm round perforation. There is severe regurgitation. 2. No LAA thrombus 3. Negative for PFO 4. Aortic valve thickening with trivial AI 5. Mild TR, trivial PI 6. LVEF 60-65% 7. Trivial pericardial effusion  RECOMMENDATIONS:    1.  Evaluation for mitral valve replacement by CT surgery is recommended.  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  The patient appears to have never recovered from his recent bacterial endocarditis and associated complications.  At present there are no clear signs of ongoing or persistent infection and follow-up blood cultures remain no growth since the patient  was readmitted to the hospital, but ongoing endocarditis could persist and the patient should be treated accordingly.   He continues to have pain related to his left knee and his lower back.  Back pain is severe and previous chest CT worrisome for possible discitis and/or osteomyelitis.  He apparently has not been ambulatory to any significant degree.  He still has moderate to large bilateral pleural effusions and he remains volume overloaded.  No diuretics are currently ordered.  Needless to say, there is considerable room for improvement with regards to optimization of his congestive heart failure.  He has very poor dentition.  He is malnourished and weak.    At present the patient is not a candidate for surgical intervention.  However, because of his relatively young age I would recommend aggressive medical therapy with hopes to stabilize and improve his condition adequately that he might tolerate mitral valve repair or replacement.  Without aggressive intervention is continuation will likely deteriorate fairly rapidly because of the severity of mitral regurgitation.   Plan:  We will continue to follow with you.  At some point the patient will need left and right heart catheterization.  He might need consultation from the advanced heart failure team.  He may need repeat thoracentesis on the right and/or thoracentesis on the left.  He should be treated for endocarditis.  Any potential residual source of infection should be identified and treated, such as infectious discitis and/or septic  arthritis.  He needs dental service consultation.  We will order MRI brain to f/u recent stroke and CTA abdomen/pelvis to r/o septic embolization to spleen and liver and evaluate the feasibility of peripheral cannulation for surgery.  He may need MRI of spine to further evaluate possible discitis/osteomyelitis.  I cannot understand why he is not currently receiving antibiotics.  I have discussed matters at length with the  patient.  All of his questions have been addressed.  We will try to meet with his parents when they can be available for consultation.  I will plan to follow-up again on Monday, August 18, 2017.   I spent in excess of 120 minutes during the conduct of this hospital consultation and >50% of this time involved direct face-to-face encounter for counseling and/or coordination of the patient's care.    Salvatore Decent. Cornelius Moras, MD 08/14/2017 2:14 PM

## 2017-08-14 NOTE — Progress Notes (Signed)
Spoke with FMTS attending concerning pt complaint of 10/10 back pain upon return from MRI. MD advised to give pt prescribed dose of fentanyl now despite it being ~1 hr early for next PRN dose. IV fertanyl given as directed. Awaiting pt response to pain med. Will continue to monitor.

## 2017-08-14 NOTE — Clinical Social Work Note (Signed)
Clinical Social Work Assessment  Patient Details  Name: Marcus [name] MRN: [id number] Date of Birth: [birth year]  Date of referral:  08/14/17               Reason for consult:  Facility Placement                Permission sought to share information with:  Facility Sport and exercise psychologist, Family Supports Permission granted to share information::     Name::     Marcus [name] and Marcus [name]::  SNFs  Relationship::  father and mother  Contact Information:  [phone]; [phone]  Housing/Transportation Living arrangements for the past 2 months:  [place] of Information:  Parent Patient Interpreter Needed:  None Criminal Activity/Legal Involvement Pertinent to Current Situation/Hospitalization:  No - Comment as needed Significant Relationships:  Parents Lives with:  Parents Do you feel safe going back to the place where you live?  Yes Need for family participation in patient care:  Yes (Comment)  Care giving concerns: Patient from home with his mother and his son. Patient was recently in SNF at [facility] on an [place]. PT recommending SNF again.  Social Worker assessment / plan: CSW called [employer] and spoke to [place] in admissions. [facility] had accepted the patient on an LOG while his Medicaid is pending. Patient left before his care was completed there; SNF staff indicated it would be unsafe for him to leave in his condition and advised him and his family as such. However, patient's mother picked him up and he went home with her. Patient readmitted at [facility] a few days after leaving SNF.  CSW met with patient and his parents at bedside today. Patient awake but did not engage in conversation with CSW. CSW elicited parents' report on what led to patient's discharge from SNF. Patient's mother, Marcus [name], indicated they were not pleased with the facility and felt he was not receiving adequate care there. Mother refuses for  patient to return to [facility]. CSW acknowledged mother's concerns and also reflected that due to patient's lack of insurance, the hospital made a payment arrangement with facility so patient could have rehab and antibiotics, and LOGs limit the facility choice.   CSW advised that if patient and his parents would like for patient to enter rehab again before his Medicaid is approved, the facility choices would still be limited, and hospital would have to be assured that patient would remain in the SNF for the duration of his treatment. CSW also advised that going home with home health care may be an option, but there would be private pay costs associated with aide/CNA services, and CSW would consult with RNCM on possible coverage for PT/OT at home. Patient's parents hopeful patient's Medicaid will be approved soon, as his application was started in early January.   CSW attempted to elicit disposition preference; mother stated "I just don't know." Parents also have questions on when/if patient will have surgery.  CSW to follow and support with disposition planning.   Employment status:  Disabled (Comment on whether or not currently receiving Disability) Insurance information:  Self Pay (Medicaid Pending) PT Recommendations:  [place] / Referral to community resources:  [place]  Patient/Family's Response to care: Not discussed.  Patient/Family's Understanding of and Emotional Response to Diagnosis, Current Treatment, and Prognosis: Parents seem to have a good understanding of patient's condition, though have questions about possible surgery. Parents undecided about disposition plan.  Emotional Assessment Appearance:  Appears stated age Attitude/Demeanor/Rapport:  Other, Unable to Assess(awake but did not engage in conversation w/ CSW) Affect (typically observed):  Unable to Assess Orientation:  Oriented to Self, Oriented to Place Alcohol / Substance  use:  Illicit Drugs Psych involvement (Current and /or in the community):  No (Comment)  Discharge Needs  Concerns to be addressed:  Discharge Planning Concerns, Care Coordination, Substance Abuse Concerns Readmission within the last 30 days:  No Current discharge risk:  Substance Abuse, Physical Impairment Barriers to Discharge:  Continued Medical Work up, Inadequate or no insurance   Marcus Emms, LCSW 08/14/2017, 2:26 PM

## 2017-08-14 NOTE — Progress Notes (Signed)
PT Cancellation Note  Patient Details Name: Marcus ReapBryon T Mifflin MRN: [id number] DOB: 01/14/1966   Cancelled Treatment:    Reason Eval/Treat Not Completed: Patient declined, no reason specified.  I don't feel well, not doing it today. 08/14/2017  Edcouch BingKen Rayneisha Bouza, PT [phone]  (pager)   Eliseo GumKenneth V Osmar Howton 08/14/2017, 4:56 PM

## 2017-08-14 NOTE — Progress Notes (Signed)
Family Medicine Teaching Service Daily Progress Note Intern Pager: [phone]  Patient name: Marcus [name] Medical record number: [id number] Date of birth: Nov 05, 1965 Age: 52 y.o. Gender: male  Primary Care Provider: Patient, No Pcp Per Consultants: CCM, ID, Cardiology  Code Status: Full   Pt Overview and Major Events to Date:  3/09: Admit forsepsis thought to be secondary to HAP and new onsetHF,cultures obtained, broad-spectrum antibiotics initiated 3/10: PCCM consulted,tolerating BiPAP 3/10: thoracentesis   Assessment and Plan: Marcus [name] a 52 y.o.malepresenting with worsening SOB. 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  Acute hypoxic hypercarbic respiratory failure Septic RLL HAPwith pleural effusion Currently satting at95% on2L NCDoes have signs of pneumonia on CXR particularly at right base with associated pleural effusion.Given recent hospitalization, meets criteria for healthcare associated pneumonia. Lactic acidosis resolved.US guided right thoracentesis performed on 08/10/17, yielding 1.4L hazy pink/blood tinged fluid; exudative by lights criteria based on LDH. Repeat CXR showing improved aeration of right lung base s/p thoracentesis. RVP negative. CXR from 3/14 showing mild improvement in lung consolidation, persistent moderate right and small left pleural effusions with no new abnormalities. S/p continue cefazolin (3/11-3/13) stopped per ID recommendations.  -CCM consulted, appreciate recommendations: have signed off  -Continuesupplemental O2with O2 goal greater than 88%,monitor ABGs -thoracentesis labs pending  -ID consulted, appreciate recommendations: will discontinue cefazolin if no growth on blood cx in 1-2 days  Chest pain Endocarditis of mitral valve h/oembolic strokeand metabolic encephalopathy new onset heart failure Recently diagnosed with mitral valve  endocarditis in January 2019 with methicillin sensitive staph aureus bacteremia and septic left knee arthritis status post aspiration.Patient does have signs concerning for new onset heart failure given elevated BNP and clinical appearance.Echo on 3/10 showing worsening LVEF to 55%, vegetation in anterior mitral leaflet, and severe mitral regurgiation.Per ID appears to be sterile vegetation.Troponinnegative x 3. Net negative -4.715L since admission, wt down to 128lbs, 150lbs on admission -cardiology consulted, appreciate recommendations: TEE tentivively scheduled for 3/14 -daily weight -strict Is and Os -CVTS consulted, appreciate recommendations   Hypokalemia-resolved K of 3.9, previously normal  -replete with KDur as needed  Anemia Current Hgb9.7. Appears to be chronically low with baseline of 8-9. Normocytic  -continue to monitor  Left knee edema h/o left MSSA septic arthritis Presented with lower extremity edema. Does have recent septic arthritis of the knee.bilateraly lower extremity dopplers negative for DVT.  Hepatitis C genotype 2B Noted during previous hospitalization. No prior treatment. Recent viral load 224K.LFTsstable.  -ID consulted, appreciate recommendations  Compression deformities of mid to lower thoracic spine Patientreports history of fall back in January 2019he continues to have persistent low back pain since discharge. Findings of uncertain age mid to lower thoracic spine compression deformities on CXR. Patient reports use of OxyContin and oxycodone for pain control though no medications per chart review. -will wean Fentanyl 25 mcg every6hours as needed,will wean daily -PT/OT consulted          Polysubstance abuse UDS positive for barbiturates and opiates on presentation. Also has history of cocaine back in January 2019. Has known endocarditis likely from IV drug use though patient reports no recentillicit drug use. Ethanol level within  normal limits on presentation. Currently on CIWA and COWS.CIWA overnight to10>14>0. COWS overnight 10>10>8 -ContinueCIWAprotocol -Daily folate and thiamine -monitor COWS score -0.5mg  tid scheduled Ativan given persistently elevated CIWA scores  FEN/GI:heart healthy diet, NPO prior to TEE ZHY:QMVH  Disposition: continued inpatient stay   Subjective:  Patient states his breathing is doing better. Reports diffuse pain throughout ribs and back. Is very nervous for TEE.   Objective: Temp:  [97.3 F (36.3 C)-99.1 F (37.3 C)] 99.1 F (37.3 C) (03/14 1140) Pulse Rate:  [94-103] 98 (03/14 1140) Resp:  [17-30] 19 (03/14 1140) BP: (84-103)/(58-85) 101/73 (03/14 1140) SpO2:  [92 %-100 %] 97 % (03/14 1140) Weight:  [128 lb 11.2 oz (58.4 kg)] 128 lb 11.2 oz (58.4 kg) (03/14 0330) Physical Exam: General: awake and alert, laying in bed, NAD, [place] in place, oriented to person and place, knew month but not year  Cardiovascular: RRR, grade 3 murmur  Respiratory: CTAB, no wheezes, rales, or rhonchi  Abdomen: soft, non tender, non distended, bowel sounds normal  Extremities: non tender, no edema   Laboratory: Recent Labs  Lab 08/12/17 0423 08/13/17 0416 08/14/17 0531  WBC 8.8 7.1 7.3  HGB 9.1* 9.4* 9.7*  HCT 29.1* 29.8* 30.7*  PLT 301 293 323   Recent Labs  Lab 08/12/17 0423 08/13/17 0416 08/14/17 0531  NA 137 135 136  K 3.2* 3.5 3.9  CL 96* 95* 99*  CO2 27 29 28   BUN 13 13 16   CREATININE 1.21 1.14 1.07  CALCIUM 8.1* 8.3* 8.4*  PROT 6.3* 6.5 6.8  BILITOT 0.8 0.5 0.7  ALKPHOS 92 97 96  ALT 10* 8* <5*  AST 14* 14* 16  GLUCOSE 107* 103* 98     Imaging/Diagnostic Tests: Dg Chest 2 View  Result Date: 08/14/2017 CLINICAL DATA:  Followup pneumonia. EXAM: CHEST - 2 VIEW COMPARISON:  08/10/2016 and older studies. FINDINGS: Moderate right and small left pleural effusions. There is associated lung base opacity, greater on the right, which is consistent with atelectasis,  pneumonia or a combination. The patchy airspace opacity noted along the perihilar left lung on the prior study has improved. Airspace opacity in the right mid to lower lung appears somewhat less confluent but is unchanged in extent. No new lung abnormalities.  No pneumothorax. IMPRESSION: 1. Mild improvement in lung consolidation compared to the prior exam. 2. Persistent moderate right and small left pleural effusions. No new abnormalities. Electronically Signed   By: Amie Portland M.D.   On: 08/14/2017 13:16   Dg Chest 2 View  Addendum Date: 08/09/2017   ADDENDUM REPORT: 08/09/2017 15:41 ADDENDUM: After further review, there are compression deformities of 2 adjacent vertebral bodies in the mid to lower thoracic spine, of uncertain age but new compared to an older chest x-ray 02/15/2005. Would consider further characterization with CT. These results were called by telephone at the time of interpretation on 08/09/2017 at 3:40 pm to Dr. Doug Sou , who verbally acknowledged these results. Electronically Signed   By: Bary Richard M.D.   On: 08/09/2017 15:41   Result Date: 08/09/2017 CLINICAL DATA:  Shortness of breath for 2 days. Bacterial meningitis diagnosed at the beginning of January, than recently diagnosed with pneumonia. History of TIA in January with left-sided weakness. Former smoker. EXAM: CHEST - 2 VIEW COMPARISON:  Chest x-ray dated 06/08/2017. FINDINGS: New dense opacity at the right lung base, likely a combination of consolidation and pleural effusion. Pleural effusion component is at least moderate in size. Patchy opacities are seen within the left perihilar and lower lung zones, favor edema. Additional opacity at the left lung base is likely a combination of atelectasis and small pleural effusion. Heart size and mediastinal contours are stable. Osseous structures about the chest are unremarkable. IMPRESSION: 1. New dense opacity at the  right lung base, likely a combination of pneumonia or  atelectasis and pleural effusion. The pleural effusion component is at least moderate in size. Would consider chest CT for further characterization. 2. Additional patchy ill-defined opacities within the left mid and lower lung, most likely edema, pneumonia considered less likely. 3. Probable mild atelectasis and/or small pleural effusion at the left lung base. Electronically Signed: By: Bary Richard M.D. On: 08/09/2017 15:29   Ct Angio Chest Pe W And/or Wo Contrast  Result Date: 08/09/2017 CLINICAL DATA:  SOB x2 days. Pt diagnosed with bacterial meningitis at the beginning of January, then recently diagnosed with pneumonia. Pt also has hx of TIA beginning of January that left him with left sided weakness. Patient had left knee surgery x 6 weeks ago for sepsis. EXAM: CT ANGIOGRAPHY CHEST WITH CONTRAST TECHNIQUE: Multidetector CT imaging of the chest was performed using the standard protocol during bolus administration of intravenous contrast. Multiplanar CT image reconstructions and MIPs were obtained to evaluate the vascular anatomy. CONTRAST:  ISOVUE-370 IOPAMIDOL (ISOVUE-370) INJECTION 76% COMPARISON:  Current chest radiographs and prior studies. FINDINGS: Cardiovascular: Satisfactory opacification of the pulmonary arteries to the segmental level. No evidence of pulmonary embolism. Heart is normal in size and configuration. No pericardial effusion. No coronary artery calcifications. The great vessels normal in caliber. No aortic atherosclerosis. No dissection. Mediastinum/Nodes: No neck base or axillary masses or pathologically enlarged lymph nodes. No mediastinal or hilar masses or discrete enlarged lymph nodes. The trachea is patent and normal in caliber. Esophagus is unremarkable. Lungs/Pleura: Large right and small left pleural effusions. Complete atelectasis of the right middle lobe and near complete atelectasis of the right lower lobe. Small area of consolidation in the anterior inferior right  upper lobe. Patchy consolidation is noted in a peribronchovascular distribution in the left upper lobe and, to lesser degree, left lower lobe. There is dependent opacity also in the left lower lobe consistent with atelectasis. No pneumothorax. Upper Abdomen: No acute abnormality. Musculoskeletal: There is marked narrowing of the T6-T7 disc space with resorption/irregularity of the lower endplate of T6 in upper endplate of T7, with mild loss of height of these vertebra. This leads to a mild focal kyphosis. This is new since a chest radiograph dated 02/15/2005. A subtle lucency across the T6 spinous process posteriorly suggests a previous fracture. All remaining vertebral bodies are normal in height. No osteoblastic or osteolytic lesions. Review of the MIP images confirms the above findings. IMPRESSION: 1. Patchy areas of consolidation in both lungs consistent with multifocal pneumonia. 2. Complete atelectasis of the right middle lobe and near complete atelectasis of the right lower lobe. Mild dependent atelectasis in the left lower lobe. 3. Large right and small left pleural effusions. 4. Marked loss of the disc space at T6-C7 with irregular resorption of endplates leading to loss of vertebral body height of T6 and T7 and a focal kyphosis. This may reflect the sequelae of an old fracture. It could be due to previous discitis/osteomyelitis. Electronically Signed   By: Amie Portland M.D.   On: 08/09/2017 18:44   Dg Chest Port 1 View  Result Date: 08/10/2017 CLINICAL DATA:  Post right thora, 1.4 L removed EXAM: PORTABLE CHEST 1 VIEW COMPARISON:  Chest x-ray dated 08/09/2017. FINDINGS: Some improvement in aeration at the left lung base. Residual opacity is presumably atelectasis or pneumonia. Ill-defined opacities persist throughout the left lung, consistent with the multifocal pneumonia better demonstrated on chest CT of 08/09/2017. Additional persistent atelectasis at the left  lung base. No pneumothorax. Heart size  and mediastinal contours appear stable. IMPRESSION: 1. Improved aeration at the right lung base status post thoracentesis. Residual opacity is presumably atelectasis or pneumonia. No pneumothorax seen. 2. Stable patchy opacities throughout the left lung, compatible with the multifocal pneumonia demonstrated on yesterday's chest CT. Electronically Signed   By: Bary RichardStan  Maynard M.D.   On: 08/10/2017 14:54   [place] Thoracentesis Asp Pleural Space W/img Guide  Result Date: 08/10/2017 INDICATION: Respiratory failure. Large right pleural effusion. Request for diagnostic and therapeutic thoracentesis. EXAM: ULTRASOUND GUIDED RIGHT THORACENTESIS MEDICATIONS: None. COMPLICATIONS: None immediate. PROCEDURE: An ultrasound guided thoracentesis was thoroughly discussed with the patient and questions answered. The benefits, risks, alternatives and complications were also discussed. The patient understands and wishes to proceed with the procedure. Written consent was obtained. Ultrasound was performed to localize and mark an adequate pocket of fluid in the right chest. The area was then prepped and draped in the normal sterile fashion. 1% Lidocaine was used for local anesthesia. Under ultrasound guidance a Safe-T-Centesis catheter was introduced. Thoracentesis was performed. The catheter was removed and a dressing applied. FINDINGS: A total of approximately 1.4 L of pink/blood-tinged fluid was removed. Samples were sent to the laboratory as requested by the clinical team. IMPRESSION: Successful ultrasound guided right thoracentesis yielding 1.4 L of pleural fluid. Read by: Brayton ElKevin Bruning PA-C Electronically Signed   By: Corlis Leak  Hassell M.D.   On: 08/10/2017 12:55     Oralia ManisAbraham, Devra Stare, DO 08/14/2017, 2:38 PM PGY-1, [facility] FPTS Intern pager: [phone], text pages welcome

## 2017-08-14 NOTE — Progress Notes (Signed)
[facility]   Reason for visit: Follow up on endocarditis  Interval History: TEE confirmed valve issue, vegetation.  Mother and father at bedside.  No associated rash, diarrhea.     Physical Exam: Constitutional:  Vitals:   08/14/17 1038 08/14/17 1140  BP: 93/72 101/73  Pulse: 100 98  Resp:  19  Temp:  99.1 F (37.3 C)  SpO2:  97%   patient appears in NAD, alert today HENT: no thrush Respiratory: Normal respiratory effort; CTA B Cardiovascular: RRR GI: soft, nt, nd  Review of Systems: Constitutional: negative for fevers and chills Gastrointestinal: negative for diarrhea  Lab Results  Component Value Date   WBC 7.3 08/14/2017   HGB 9.7 (L) 08/14/2017   HCT 30.7 (L) 08/14/2017   MCV 90.8 08/14/2017   PLT 323 08/14/2017    Lab Results  Component Value Date   CREATININE 1.07 08/14/2017   BUN 16 08/14/2017   NA 136 08/14/2017   K 3.9 08/14/2017   CL 99 (L) 08/14/2017   CO2 28 08/14/2017    Lab Results  Component Value Date   ALT <5 (L) 08/14/2017   AST 16 08/14/2017   ALKPHOS 96 08/14/2017     Microbiology: Recent Results (from the past 240 hour(s))  Culture, blood (Routine x 2)     Status: None (Preliminary result)   Collection Time: 08/09/17  2:25 PM  Result Value Ref Range Status   Specimen Description BLOOD LEFT WRIST  Final   Special Requests IN PEDIATRIC BOTTLE Blood Culture adequate volume  Final   Culture   Final    NO GROWTH 4 DAYS Performed at [facility], [address] [place], [place] [address]    Report Status PENDING  Incomplete  Culture, blood (Routine x 2)     Status: None (Preliminary result)   Collection Time: 08/09/17  2:48 PM  Result Value Ref Range Status   Specimen Description BLOOD RIGHT HAND  Final   Special Requests IN PEDIATRIC BOTTLE Blood Culture adequate volume  Final   Culture   Final    NO GROWTH 4 DAYS Performed at [facility], [address] [place], [place] [address]      Report Status PENDING  Incomplete  Urine culture     Status: Abnormal   Collection Time: 08/09/17  3:50 PM  Result Value Ref Range Status   Specimen Description URINE, CLEAN CATCH  Final   Special Requests   Final    NONE Performed at [facility], [address], [place], [place] [address]    Culture MULTIPLE SPECIES PRESENT, SUGGEST RECOLLECTION (A)  Final   Report Status 08/10/2017 FINAL  Final  Respiratory Panel by PCR     Status: None   Collection Time: 08/09/17  4:00 PM  Result Value Ref Range Status   Adenovirus NOT DETECTED NOT DETECTED Final   Coronavirus 229E NOT DETECTED NOT DETECTED Final   Coronavirus HKU1 NOT DETECTED NOT DETECTED Final   Coronavirus NL63 NOT DETECTED NOT DETECTED Final   Coronavirus OC43 NOT DETECTED NOT DETECTED Final   Metapneumovirus NOT DETECTED NOT DETECTED Final   Rhinovirus / Enterovirus NOT DETECTED NOT DETECTED Final   Influenza A NOT DETECTED NOT DETECTED Final   Influenza B NOT DETECTED NOT DETECTED Final   Parainfluenza Virus 1 NOT DETECTED NOT DETECTED Final   Parainfluenza Virus 2 NOT DETECTED NOT DETECTED Final   Parainfluenza Virus 3 NOT DETECTED NOT  DETECTED Final   Parainfluenza Virus 4 NOT DETECTED NOT DETECTED Final   Respiratory Syncytial Virus NOT DETECTED NOT DETECTED Final   Bordetella pertussis NOT DETECTED NOT DETECTED Final   Chlamydophila pneumoniae NOT DETECTED NOT DETECTED Final   Mycoplasma pneumoniae NOT DETECTED NOT DETECTED Final    Comment: Performed at [facility], [address] [place], [place] [address]  Culture, blood (routine x 2) Call MD if unable to obtain prior to antibiotics being given     Status: None (Preliminary result)   Collection Time: 08/10/17  1:10 AM  Result Value Ref Range Status   Specimen Description BLOOD RIGHT HAND  Final   Special Requests IN PEDIATRIC BOTTLE Blood Culture adequate volume  Final   Culture   Final    NO GROWTH 3 DAYS Performed at [facility],  [address] [place], [place] [address]    Report Status PENDING  Incomplete  Culture, blood (routine x 2) Call MD if unable to obtain prior to antibiotics being given     Status: None (Preliminary result)   Collection Time: 08/10/17  1:20 AM  Result Value Ref Range Status   Specimen Description BLOOD RIGHT ARM  Final   Special Requests   Final    BOTTLES DRAWN AEROBIC AND ANAEROBIC Blood Culture adequate volume   Culture   Final    NO GROWTH 3 DAYS Performed at [facility], [address] [place], [place] [address]    Report Status PENDING  Incomplete  Fungus Culture With Stain     Status: None (Preliminary result)   Collection Time: 08/10/17 12:59 PM  Result Value Ref Range Status   Fungus Stain Final report  Final    Comment: (NOTE) Performed At: [facility] [address] [place], [place] [id number] Jolene SchimkeNagendra Sanjai MD LK:[phone]    Fungus (Mycology) Culture PENDING  Incomplete   Fungal Source PLEURAL  Final    Comment: RIGHT Performed at [facility], [address] [place], [place] [address]   Acid Fast Smear (AFB)     Status: None   Collection Time: 08/10/17 12:59 PM  Result Value Ref Range Status   AFB Specimen Processing Concentration  Final   Acid Fast Smear Negative  Final    Comment: (NOTE) Performed At: [facility] [place] [address] [place], [place] [id number] Jolene SchimkeNagendra Sanjai MD LO:[phone]    Source (AFB) PLEURAL  Final    Comment: RIGHT Performed at [facility], [address] [place], [place] [address]   Culture, body fluid-bottle     Status: None (Preliminary result)   Collection Time: 08/10/17 12:59 PM  Result Value Ref Range Status   Specimen Description PLEURAL RIGHT  Final   Special Requests NONE  Final   Culture   Final    NO GROWTH 3 DAYS Performed at [facility], [address] [place], [place] [address]    Report Status PENDING  Incomplete  Gram stain     Status: None   Collection Time:  08/10/17 12:59 PM  Result Value Ref Range Status   Specimen Description PLEURAL RIGHT  Final   Special Requests NONE  Final   Gram Stain   Final    FEW WBC PRESENT,BOTH PMN AND MONONUCLEAR NO ORGANISMS SEEN Performed at [facility], [address] [place], [place] [address]    Report Status 08/10/2017 FINAL  Final  Fungus Culture Result  Status: None   Collection Time: 08/10/17 12:59 PM  Result Value Ref Range Status   Result 1 Comment  Final    Comment: (NOTE) KOH/Calcofluor preparation:  no fungus observed. Performed At: [facility] [address] [place], [place] [id number] Jolene Schimke MD WU:[id number] Performed at [facility], [address] [place], [place] [address]   MRSA PCR Screening     Status: Abnormal   Collection Time: 08/11/17  6:18 AM  Result Value Ref Range Status   MRSA by PCR POSITIVE (A) NEGATIVE Final    Comment:        The GeneXpert MRSA Assay (FDA approved for NASAL specimens only), is one component of a comprehensive MRSA colonization surveillance program. It is not intended to diagnose MRSA infection nor to guide or monitor treatment for MRSA infections. RESULT CALLED TO, READ BACK BY AND VERIFIED WITH: RN Osa Craver [id number] [phone] MLM Performed at [facility], [address] [place], [place] [address]     Impression/Plan:  1. Endocarditis - s/p treatment and repeat cultures ngtd.  I will send of an additional set to be sure.    2.  Valve disfunction - likely will need valve replacement.  If that occurs, we will monitor valve culture, path for any growth.    3.  Pleural effusion - culture remains negative.

## 2017-08-14 NOTE — H&P (Signed)
   INTERVAL PROCEDURE H&P  History and Physical Interval Note:  08/14/2017 9:36 AM  Marcus [name] has presented today for their planned procedure. The various methods of treatment have been discussed with the patient and family. After consideration of risks, benefits and other options for treatment, the patient has consented to the procedure.  [name] outpatient history has been reviewed, patient examined, and no change in status from most recent office note within the past 30 days. I have reviewed the patients' chart and labs and will proceed as planned. Questions were answered to the patient's satisfaction.   Marcus NoseKenneth Marcus. Deangelo Berns, MD, [facility]ACC, FACP  Marcus [name]  Marcus [name]  Medical Director of the [facility] Diplomate of the American Board of Clinical Lipidology Attending Cardiologist  Direct Dial: [phone]  Fax: [phone]  Website:  [name]  Marcus [name] Marcus [name] 08/14/2017, 9:36 AM

## 2017-08-14 NOTE — Transfer of Care (Signed)
Immediate Anesthesia Transfer of Care Note  Patient: Marcus [name]  Procedure(s) Performed: TRANSESOPHAGEAL ECHOCARDIOGRAM (TEE) (N/A )  Patient Location: Endoscopy Unit  Anesthesia Type:MAC  Level of Consciousness: drowsy  Airway & Oxygen Therapy: Patient Spontanous Breathing and Patient connected to nasal cannula oxygen  Post-op Assessment: Report given to RN and Post -op Vital signs reviewed and stable  Post vital signs: Reviewed and stable  Last Vitals:  Vitals:   08/14/17 0912 08/14/17 1024  BP: 103/64   Pulse: (!) 102   Resp: 19   Temp: 36.8 C 36.7 C  SpO2: 96%     Last Pain:  Vitals:   08/14/17 1024  TempSrc: Oral  PainSc:       Patients Stated Pain Goal: 0 (51/76/16 0737)  Complications: No apparent anesthesia complications

## 2017-08-14 NOTE — CV Procedure (Signed)
TRANSESOPHAGEAL ECHOCARDIOGRAM (TEE) NOTE  INDICATIONS: infective endocarditis  PROCEDURE:   Informed consent was obtained prior to the procedure. The risks, benefits and alternatives for the procedure were discussed and the patient comprehended these risks.  Risks include, but are not limited to, cough, sore throat, vomiting, nausea, somnolence, esophageal and stomach trauma or perforation, bleeding, low blood pressure, aspiration, pneumonia, infection, trauma to the teeth and death.    After a procedural time-out, the patient was given propofol per anesthesia for sedation.  The patient's heart rate, blood pressure, and oxygen saturation are monitored continuously during the procedure.The oropharynx was anesthetized with 2 cetacaine sprays.  The transesophageal probe was inserted in the esophagus and stomach without difficulty and multiple views were obtained.  The patient was kept under observation until the patient left the procedure room.  The patient left the procedure room in stable condition.   Agitated microbubble saline contrast was administered.  COMPLICATIONS:    There were no immediate complications.  Findings:  1. LEFT VENTRICLE: The left ventricular wall thickness is mildly increased.  The left ventricular cavity is normal in size. Wall motion is normal.  LVEF is 60-65%.  2. RIGHT VENTRICLE:  The right ventricle is normal in structure and function without any thrombus or masses.    3. LEFT ATRIUM:  The left atrium is mildly dilated in size without any thrombus or masses.  There is not spontaneous echo contrast ("smoke") in the left atrium consistent with a low flow state.  4. LEFT ATRIAL APPENDAGE:  The left atrial appendage is free of any thrombus or masses. The appendage has single lobes. Pulse doppler indicates high flow in the appendage.  5. ATRIAL SEPTUM:  The atrial septum appears intact and is free of thrombus and/or masses.  There is no evidence for interatrial  shunting by color doppler and saline microbubble.  6. RIGHT ATRIUM:  The right atrium is dilated without any thrombus or masses.  7. MITRAL VALVE:  The mitral valve is thickened with a mobile mass noted on the anterior leaflet - flail cord is also noted. There is a ~0.5 cm round perforation of the anterior leaflet with  Severe regurgitation both through the perforation and the malcoapted valve leaflets. This was visualized well in 2D and 3D views.   8. AORTIC VALVE:  The aortic valve is trileaflet, somewhat thickened, but no obvious vegetations. There is trivial regurgitation.  There were no vegetations or stenosis  9. TRICUSPID VALVE:  The tricuspid valve is normal in structure and function with Mild regurgitation.  There were no vegetations or stenosis  10.  PULMONIC VALVE:  The pulmonic valve is normal in structure and function with trivial regurgitation.  There were no vegetations or stenosis.   11. AORTIC ARCH, ASCENDING AND DESCENDING AORTA:  There was no Myrtis Ser et. Al, 1992) atherosclerosis of the ascending aorta, aortic arch, or proximal descending aorta.  12. PULMONARY VEINS: Anomalous pulmonary venous return was not noted.  13. PERICARDIUM: The pericardium appeared normal and non-thickened.  There is a Minimal pericardial effusion.  IMPRESSION:   1. Endocarditis of the mitral valve, with mass and partially flail anterior leaflet, flail cord and also a large 0.5 cm round perforation. There is severe regurgitation. 2. No LAA thrombus 3. Negative for PFO 4. Aortic valve thickening with trivial AI 5. Mild TR, trivial PI 6. LVEF 60-65% 7. Trivial pericardial effusion  RECOMMENDATIONS:    1.  Evaluation for mitral valve replacement by CT surgery is recommended.  Time Spent Directly with the Patient:  60 minutes   Chrystie NoseKenneth C. Sayde Lish, MD, [facility]ACC, FACP  [place]  [facility]  Medical Director of the [facility]  [facility] Diplomate  of the American Board of Clinical Lipidology Attending Cardiologist  Direct Dial: [phone]  Fax: [phone]  Website:  [email or web address]  Chardonnay Holzmann C Kloee Ballew 08/14/2017, 10:26 AM

## 2017-08-14 NOTE — NC FL2 (Signed)
[place] MEDICAID FL2 LEVEL OF CARE SCREENING TOOL     IDENTIFICATION  Patient Name: Marcus [name] Birthdate: 01/10/1966 Sex: male Admission Date (Current Location): 08/09/2017  [facility] [place]   Facility and Address:  [facility] [place], [address], [place], [place] [address]      Provider Number: [id number]  Attending Physician Name and Address:  Nestor RampNeal, Sara L, MD  Relative Name and Phone Number:  Clent RidgesRobert Epping, father, [phone]    Current Level of Care: Hospital Recommended Level of Care: Skilled Nursing Facility Prior Approval Number:    Date Approved/Denied:   PASRR Number: [id number]  Discharge Plan: SNF    Current Diagnoses: Patient Active Problem List   Diagnosis Date Noted  . HCAP (healthcare-associated pneumonia)   . Pleural effusion on right   . Severe mitral regurgitation   . Acute heart failure (HCC)   . Malnutrition of moderate degree 06/19/2017  . Pressure injury of skin 06/19/2017  . Bacterial endocarditis   . Altered mental status   . Hyperkalemia   . 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 Encephalopathy 06/09/2017  . Bacteremia due to methicillin susceptible Staphylococcus aureus (MSSA) 06/09/2017  . Elevated liver enzymes 06/09/2017  . Normocytic anemia 06/09/2017  . Thrombocytopenia (HCC) 06/09/2017  . Polysubstance abuse (HCC) 06/09/2017  . Endocarditis of mitral valve 06/09/2017  . Septic arthritis of knee, left (HCC) 06/09/2017  . Poor dentition 06/09/2017  . Meningitis due to bacteria   . Septic embolism (HCC)   . Sepsis (HCC)   . AKI (acute kidney injury) (HCC)     Orientation RESPIRATION BLADDER Height & Weight     Self, Situation, Place  O2(nasal cannula 2L) Incontinent, External catheter Weight: 128 lb 11.2 oz (58.4 kg) Height:  5\' 11"  (180.3 cm)  BEHAVIORAL SYMPTOMS/MOOD NEUROLOGICAL BOWEL NUTRITION STATUS      Incontinent Diet(please see DC summary)  AMBULATORY  STATUS COMMUNICATION OF NEEDS Skin   Extensive Assist Verbally Normal                       Personal Care Assistance Level of Assistance  Bathing, Feeding, Dressing Bathing Assistance: Maximum assistance Feeding assistance: Limited assistance Dressing Assistance: Maximum assistance     Functional Limitations Info  Sight, Hearing, Speech Sight Info: Adequate Hearing Info: Adequate Speech Info: Adequate    SPECIAL CARE FACTORS FREQUENCY  PT (By licensed PT), OT (By licensed OT)     PT Frequency: 5x/week OT Frequency: 5x/week            Contractures Contractures Info: Not present    Additional Factors Info  Code Status, Allergies, Isolation Precautions Code Status Info: Full Allergies Info: No Known Allergies     Isolation Precautions Info: contact precautions     Current Medications (08/14/2017):  This is the current hospital active medication list Current Facility-Administered Medications  Medication Dose Route Frequency Provider Last Rate Last Dose  . 0.9 %  sodium chloride infusion   Intravenous Continuous Quintella Reicherturner, Traci R, MD 20 mL/hr at 08/13/17 1716    . [MAR Hold] acetaminophen (TYLENOL) tablet 500 mg  500 mg Oral Q6H PRN Garth Bignessimberlake, Kathryn, MD   500 mg at 08/13/17 1807  . [MAR Hold] albuterol (PROVENTIL) (2.5 MG/3ML) 0.083% nebulizer solution 2.5 mg  2.5 mg Nebulization Q2H PRN Oralia ManisAbraham, Sherin, DO      . butamben-tetracaine-benzocaine (CETACAINE) spray    PRN Chrystie NoseHilty, Kenneth C, MD  1 spray at 08/14/17 0953  . [MAR Hold] fentaNYL (SUBLIMAZE) injection 25 mcg  25 mcg Intravenous Q6H PRN Garth Bigness, MD   25 mcg at 08/14/17 [phone]  . [MAR Hold] folic acid (FOLVITE) tablet 1 mg  1 mg Oral Daily Beaulah Dinning, MD   1 mg at 08/14/17 9604  . [MAR Hold] LORazepam (ATIVAN) injection 0-4 mg  0-4 mg Intravenous Q12H Beaulah Dinning, MD   1 mg at 08/13/17 2204  . [MAR Hold] multivitamin with minerals tablet 1 tablet  1 tablet Oral Daily Beaulah Dinning, MD   1 tablet at 08/14/17 [phone]  . [MAR Hold] mupirocin ointment (BACTROBAN) 2 %   Nasal BID Nestor Ramp, MD      . Mitzi Hansen Hold] thiamine (VITAMIN B-1) tablet 100 mg  100 mg Oral Daily Beaulah Dinning, MD   100 mg at 08/14/17 8119   Or  . [MAR Hold] thiamine (B-1) injection 100 mg  100 mg Intravenous Daily Gambino, Valentina Shaggy, MD       Facility-Administered Medications Ordered in Other Encounters  Medication Dose Route Frequency Provider Last Rate Last Dose  . propofol (DIPRIVAN) 10 mg/mL bolus/IV push    Anesthesia Intra-op Adonis Housekeeper, CRNA   30 mg at 08/14/17 0951  . propofol (DIPRIVAN) 500 MG/50ML infusion    Continuous PRN Adonis Housekeeper, CRNA 52.6 mL/hr at 08/14/17 0951 150 mcg/kg/min at 08/14/17 1478     Discharge Medications: Please see discharge summary for a list of discharge medications.  Relevant Imaging Results:  Relevant Lab Results:   Additional Information SSN: [id number]  Abigail Butts, LCSW

## 2017-08-14 NOTE — Progress Notes (Signed)
TEE results reviewed: Normal LVF, mild LAE, mitral valve thickening with a mobile mass noted on the anterior leaflet - flail cord is also noted. There is a ~0.5 cm round perforation of the anterior leaflet with  Severe regurgitation both through the perforation and the malcoapted valve leaflets. This was visualized well in 2D and 3D views.   Will consult CVTS surgery.  Continue IV Antibx guided by ID.

## 2017-08-14 NOTE — Anesthesia Preprocedure Evaluation (Signed)
Anesthesia Evaluation  Patient identified by MRN, date of birth, ID band Patient awake    Reviewed: Allergy & Precautions, H&P , NPO status , Patient's Chart, lab work & pertinent test results  Airway Mallampati: II   Neck ROM: full    Dental   Pulmonary former smoker,    breath sounds clear to auscultation       Cardiovascular  Rhythm:regular Rate:Normal  Bacterial endocarditis   Neuro/Psych encephalopathic    GI/Hepatic (+)     substance abuse  alcohol use, Hepatitis -, C  Endo/Other    Renal/GU      Musculoskeletal  (+) Arthritis ,   Abdominal   Peds  Hematology  (+) Blood dyscrasia, anemia ,   Anesthesia Other Findings   Reproductive/Obstetrics                             Anesthesia Physical Anesthesia Plan  ASA: III  Anesthesia Plan: MAC   Post-op Pain Management:    Induction: Intravenous  PONV Risk Score and Plan: 1 and Propofol infusion, Ondansetron and Treatment may vary due to age or medical condition  Airway Management Planned: Nasal Cannula  Additional Equipment:   Intra-op Plan:   Post-operative Plan:   Informed Consent: I have reviewed the patients History and Physical, chart, labs and discussed the procedure including the risks, benefits and alternatives for the proposed anesthesia with the patient or authorized representative who has indicated his/her understanding and acceptance.     Plan Discussed with: CRNA, Anesthesiologist and Surgeon  Anesthesia Plan Comments:         Anesthesia Quick Evaluation

## 2017-08-15 ENCOUNTER — Inpatient Hospital Stay (HOSPITAL_COMMUNITY): Payer: Medicaid Other

## 2017-08-15 ENCOUNTER — Encounter (HOSPITAL_COMMUNITY): Payer: Self-pay | Admitting: Dentistry

## 2017-08-15 DIAGNOSIS — G8929 Other chronic pain: Secondary | ICD-10-CM

## 2017-08-15 DIAGNOSIS — K089 Disorder of teeth and supporting structures, unspecified: Secondary | ICD-10-CM

## 2017-08-15 DIAGNOSIS — I058 Other rheumatic mitral valve diseases: Secondary | ICD-10-CM

## 2017-08-15 DIAGNOSIS — E43 Unspecified severe protein-calorie malnutrition: Secondary | ICD-10-CM | POA: Diagnosis present

## 2017-08-15 DIAGNOSIS — M549 Dorsalgia, unspecified: Secondary | ICD-10-CM

## 2017-08-15 LAB — CULTURE, BLOOD (ROUTINE X 2)
CULTURE: NO GROWTH
CULTURE: NO GROWTH
SPECIAL REQUESTS: ADEQUATE
Special Requests: ADEQUATE

## 2017-08-15 LAB — COMPREHENSIVE METABOLIC PANEL
ALBUMIN: 2.2 g/dL — AB (ref 3.5–5.0)
ALT: <5 U/L — ABNORMAL LOW (ref 17–63)
AST: 22 U/L (ref 15–41)
Alkaline Phosphatase: 88 U/L (ref 38–126)
Anion gap: 9 (ref 5–15)
BUN: 14 mg/dL (ref 6–20)
CHLORIDE: 101 mmol/L (ref 101–111)
CO2: 26 mmol/L (ref 22–32)
Calcium: 8.5 mg/dL — ABNORMAL LOW (ref 8.9–10.3)
Creatinine, Ser: 1.16 mg/dL (ref 0.61–1.24)
GFR calc Af Amer: >60 mL/min (ref >60)
GFR calc non Af Amer: >60 mL/min (ref >60)
GLUCOSE: 124 mg/dL — AB (ref 65–99)
POTASSIUM: 4.2 mmol/L (ref 3.5–5.1)
SODIUM: 136 mmol/L (ref 135–145)
Total Bilirubin: 0.5 mg/dL (ref 0.3–1.2)
Total Protein: 6.7 g/dL (ref 6.5–8.1)

## 2017-08-15 LAB — CBC
HEMATOCRIT: 32.1 % — AB (ref 39.0–52.0)
Hemoglobin: 10 g/dL — ABNORMAL LOW (ref 13.0–17.0)
MCH: 28.7 pg (ref 26.0–34.0)
MCHC: 31.2 g/dL (ref 30.0–36.0)
MCV: 92 fL (ref 78.0–100.0)
Platelets: 298 10*3/uL (ref 150–400)
RBC: 3.49 MIL/uL — ABNORMAL LOW (ref 4.22–5.81)
RDW: 15.1 % (ref 11.5–15.5)
WBC: 5.4 10*3/uL (ref 4.0–10.5)

## 2017-08-15 LAB — CULTURE, BODY FLUID W GRAM STAIN -BOTTLE: Culture: NO GROWTH

## 2017-08-15 LAB — CULTURE, BODY FLUID-BOTTLE

## 2017-08-15 MED ORDER — ENSURE ENLIVE PO LIQD
237.0000 mL | Freq: Three times a day (TID) | ORAL | Status: DC
  Administered 2017-08-15 – 2017-08-27 (×28): 237 mL via ORAL

## 2017-08-15 MED ORDER — FUROSEMIDE 10 MG/ML IJ SOLN
40.0000 mg | Freq: Two times a day (BID) | INTRAMUSCULAR | Status: DC
  Administered 2017-08-15 – 2017-08-20 (×10): 40 mg via INTRAVENOUS
  Filled 2017-08-15 (×10): qty 4

## 2017-08-15 MED ORDER — CEFAZOLIN SODIUM-DEXTROSE 2-4 GM/100ML-% IV SOLN
2.0000 g | Freq: Three times a day (TID) | INTRAVENOUS | Status: DC
  Administered 2017-08-15 – 2017-08-25 (×29): 2 g via INTRAVENOUS
  Filled 2017-08-15 (×31): qty 100

## 2017-08-15 MED ORDER — LORAZEPAM 0.5 MG PO TABS
0.5000 mg | ORAL_TABLET | Freq: Two times a day (BID) | ORAL | Status: DC
  Administered 2017-08-15 – 2017-08-16 (×2): 0.5 mg via ORAL
  Filled 2017-08-15 (×2): qty 1

## 2017-08-15 MED ORDER — ENOXAPARIN SODIUM 40 MG/0.4ML ~~LOC~~ SOLN
40.0000 mg | SUBCUTANEOUS | Status: DC
  Administered 2017-08-15 – 2017-08-18 (×4): 40 mg via SUBCUTANEOUS
  Filled 2017-08-15 (×4): qty 0.4

## 2017-08-15 MED ORDER — CEFAZOLIN SODIUM-DEXTROSE 1-4 GM/50ML-% IV SOLN
1.0000 g | Freq: Three times a day (TID) | INTRAVENOUS | Status: DC
  Filled 2017-08-15: qty 50

## 2017-08-15 MED ORDER — CEFAZOLIN SODIUM-DEXTROSE 2-4 GM/100ML-% IV SOLN
2.0000 g | INTRAVENOUS | Status: AC
  Administered 2017-08-15: 2 g via INTRAVENOUS
  Filled 2017-08-15: qty 100
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
## 2017-08-15 NOTE — Progress Notes (Addendum)
Error Signed, Theodore Demarkhonda Barrett, PA-C  08/15/2017, 7:56 AM

## 2017-08-15 NOTE — Progress Notes (Signed)
Family Medicine Teaching Service Daily Progress Note Intern Pager: [phone]  Patient name: Marcus [name] Medical record number: [id number] Date of birth: [birth year] Age: 52 y.o. Gender: male  Primary Care Provider: Patient, No Pcp Per Consultants: CCM, ID, Cardiology  Code Status: Full   Pt Overview and Major Events to Date:  3/09: Admit forsepsis thought to be secondary to HAP and new onsetHF,cultures obtained, broad-spectrum antibiotics initiated 3/10: PCCM consulted,tolerating BiPAP 3/10: thoracentesis  Assessment and Plan: Marcus [name] a 52 y.o.malepresenting with worsening SOB. 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  Acute hypoxic hypercarbic respiratory failure Septic RLL HAPwith pleural effusion Currently charted as satting at98% on2.5L [place].While in room patient was on room air satting in mid 90s. CXR from 3/14 showing mild improvement in lung consolidation, persistent moderate right and small left pleural effusions with no new abnormalities. S/p continue cefazolin (3/11-3/13) stopped per ID recommendations. CTA showing no PE, bilateral pleural effusion.  -Continuesupplemental O2with O2 goal greater than 88%,monitor ABGs -thoracentesis labs pending -ID consulted, appreciate recommendations  -pulmonology consulted, appreciate recommendations   Chest pain Endocarditis of mitral valve h/oembolic strokeand metabolic encephalopathy new onset heart failure Recently diagnosed with mitral valve endocarditis in January 2019 with methicillin sensitive staph aureus bacteremia and septic left knee arthritis status post aspiration.Patient does have signs concerning for new onset heart failure given elevated BNP and clinical appearance.Echo on 3/10 showing worsening LVEF to 55%, vegetation in anterior mitral leaflet, and severe mitral regurgiation.Per ID appears to be sterile  vegetation.Troponinnegative x 3. Net negative -4.715L since admission, wt down to 128lbs, 150lbs on admission.CT abd/pelvis showing no aortic dissection. TEE showing normal LVF, mild LAE, mitral valve thickening w/ mobilemass on anterior leaflet flail cord. 0.5cm round perforation, severe regurgitation. Orthopantogram showing poor dentition with possible abscess.  -cardiology consulted, appreciate recommendations: TEE tentivively scheduled for 3/14 -daily weight -strict Is and Os -CVTS consulted, appreciate recommendations: future left and right heart cath, treat for endocarditis, will follow up on 08/18/17 -continue ancef (3/15-) -will consult hospital dentist, Dr. Kristin Bruins, appreciate recommendations.  Hypokalemia-resolved K of 3.9 on 3/14, previously normal  -replete with KDur as needed -labs pending   Anemia Current Hgb9.7 on 3/14. Appears to be chronically low with baseline of 8-9. Normocytic  -continue to monitor -labs pending   Left knee edema h/o left MSSA septic arthritis Presented with lower extremity edema. Does have recent septic arthritis of the knee.bilateraly lower extremity dopplers negative for DVT.  Hepatitis C genotype 2B Noted during previous hospitalization. No prior treatment. Recent viral load 224K.LFTsstable.  -ID consulted, appreciate recommendations  Compression deformities of mid to lower thoracic spine Patientreports history of fall back in January 2019he continues to have persistent low back pain since discharge. Findings of uncertain age mid to lower thoracic spine compression deformities on CXR. Patient reports use of OxyContin and oxycodone for pain control though no medications per chart review. -will weanFentanyl11mcg every6hours as needed,will wean daily -PT/OT consulted  Polysubstance abuse UDS positive for barbiturates and opiates on presentation. Also has history of cocaine back in January 2019. Has known  endocarditis likely from IV drug use though patient reports no recentillicit drug use. Ethanol level within normal limits on presentation. Currently on CIWA and COWS.CIWA overnight to4>12>5. COWS overnight 0>1>9>6 -ContinueCIWAprotocol -Daily folate and thiamine -monitor COWS score -0.5mg  bid scheduled Ativan given persistently elevated CIWA scores  FEN/GI:heart healthy diet, NPO prior to TEE JYN:WGNF  Disposition: continued inpatient stay  Subjective:  Patient today reports feeling better. States breathing is improved. Reports continued lower back pain. Family is very appreciative of care. Mom reports loose bowel   Objective: Temp:  [99.3 F (37.4 C)-99.4 F (37.4 C)] 99.3 F (37.4 C) (03/15 0045) Pulse Rate:  [98-102] 98 (03/15 0741) Resp:  [18-31] 19 (03/15 0730) BP: (98-110)/(59-73) 98/59 (03/15 0741) SpO2:  [96 %-100 %] 97 % (03/15 0741) Weight:  [108 lb 3.9 oz (49.1 kg)] 108 lb 3.9 oz (49.1 kg) (03/15 0317) Physical Exam: General: awake and alert, oriented to person and place, knew month but not year  Cardiovascular: RRR, grade 3 murmur  Respiratory: CTAB, no wheezes, rales or rhonchi, poor breath sounds at bases, no increased work of breathing  Abdomen: soft, non tender, non distended, bowel sounds normal  Extremities: non tender, no edema  Laboratory: Recent Labs  Lab 08/12/17 0423 08/13/17 0416 08/14/17 0531  WBC 8.8 7.1 7.3  HGB 9.1* 9.4* 9.7*  HCT 29.1* 29.8* 30.7*  PLT 301 293 323   Recent Labs  Lab 08/13/17 0416 08/14/17 0531 08/15/17 1054  NA 135 136 136  K 3.5 3.9 4.2  CL 95* 99* 101  CO2 29 28 26   BUN 13 16 14   CREATININE 1.14 1.07 1.16  CALCIUM 8.3* 8.4* 8.5*  PROT 6.5 6.8 6.7  BILITOT 0.5 0.7 0.5  ALKPHOS 97 96 88  ALT 8* <5* <5*  AST 14* 16 22  GLUCOSE 103* 98 124*    Imaging/Diagnostic Tests: Dg Orthopantogram  Result Date: 08/15/2017 CLINICAL DATA:  Poor dentition.  Cardiac workup. EXAM: ORTHOPANTOGRAM/PANORAMIC  COMPARISON:  None. FINDINGS: There are numerous missing maxillary and mandibular teeth, and multiple remaining maxillary teeth appear broken. There is mild periapical lucency associated with a left maxillary premolar tooth. IMPRESSION: Poor dentition with multiple missing and broken teeth. Mild periapical lucency associated with a left maxillary premolar tooth, abscess not excluded. Electronically Signed   By: Sebastian AcheAllen  Grady M.D.   On: 08/15/2017 09:12   Dg Chest 2 View  Result Date: 08/14/2017 CLINICAL DATA:  Followup pneumonia. EXAM: CHEST - 2 VIEW COMPARISON:  08/10/2016 and older studies. FINDINGS: Moderate right and small left pleural effusions. There is associated lung base opacity, greater on the right, which is consistent with atelectasis, pneumonia or a combination. The patchy airspace opacity noted along the perihilar left lung on the prior study has improved. Airspace opacity in the right mid to lower lung appears somewhat less confluent but is unchanged in extent. No new lung abnormalities.  No pneumothorax. IMPRESSION: 1. Mild improvement in lung consolidation compared to the prior exam. 2. Persistent moderate right and small left pleural effusions. No new abnormalities. Electronically Signed   By: Amie Portlandavid  Ormond M.D.   On: 08/14/2017 13:16   Dg Chest 2 View  Addendum Date: 08/09/2017   ADDENDUM REPORT: 08/09/2017 15:41 ADDENDUM: After further review, there are compression deformities of 2 adjacent vertebral bodies in the mid to lower thoracic spine, of uncertain age but new compared to an older chest x-ray 02/15/2005. Would consider further characterization with CT. These results were called by telephone at the time of interpretation on 08/09/2017 at 3:40 pm to Dr. Doug SouSAM JACUBOWITZ , who verbally acknowledged these results. Electronically Signed   By: Bary RichardStan  Maynard M.D.   On: 08/09/2017 15:41   Result Date: 08/09/2017 CLINICAL DATA:  Shortness of breath for 2 days. Bacterial meningitis diagnosed at  the beginning of January, than recently diagnosed with pneumonia. History of TIA in January  with left-sided weakness. Former smoker. EXAM: CHEST - 2 VIEW COMPARISON:  Chest x-ray dated 06/08/2017. FINDINGS: New dense opacity at the right lung base, likely a combination of consolidation and pleural effusion. Pleural effusion component is at least moderate in size. Patchy opacities are seen within the left perihilar and lower lung zones, favor edema. Additional opacity at the left lung base is likely a combination of atelectasis and small pleural effusion. Heart size and mediastinal contours are stable. Osseous structures about the chest are unremarkable. IMPRESSION: 1. New dense opacity at the right lung base, likely a combination of pneumonia or atelectasis and pleural effusion. The pleural effusion component is at least moderate in size. Would consider chest CT for further characterization. 2. Additional patchy ill-defined opacities within the left mid and lower lung, most likely edema, pneumonia considered less likely. 3. Probable mild atelectasis and/or small pleural effusion at the left lung base. Electronically Signed: By: Bary Richard M.D. On: 08/09/2017 15:29   Ct Angio Chest Pe W And/or Wo Contrast  Result Date: 08/09/2017 CLINICAL DATA:  SOB x2 days. Pt diagnosed with bacterial meningitis at the beginning of January, then recently diagnosed with pneumonia. Pt also has hx of TIA beginning of January that left him with left sided weakness. Patient had left knee surgery x 6 weeks ago for sepsis. EXAM: CT ANGIOGRAPHY CHEST WITH CONTRAST TECHNIQUE: Multidetector CT imaging of the chest was performed using the standard protocol during bolus administration of intravenous contrast. Multiplanar CT image reconstructions and MIPs were obtained to evaluate the vascular anatomy. CONTRAST:  ISOVUE-370 IOPAMIDOL (ISOVUE-370) INJECTION 76% COMPARISON:  Current chest radiographs and prior studies. FINDINGS:  Cardiovascular: Satisfactory opacification of the pulmonary arteries to the segmental level. No evidence of pulmonary embolism. Heart is normal in size and configuration. No pericardial effusion. No coronary artery calcifications. The great vessels normal in caliber. No aortic atherosclerosis. No dissection. Mediastinum/Nodes: No neck base or axillary masses or pathologically enlarged lymph nodes. No mediastinal or hilar masses or discrete enlarged lymph nodes. The trachea is patent and normal in caliber. Esophagus is unremarkable. Lungs/Pleura: Large right and small left pleural effusions. Complete atelectasis of the right middle lobe and near complete atelectasis of the right lower lobe. Small area of consolidation in the anterior inferior right upper lobe. Patchy consolidation is noted in a peribronchovascular distribution in the left upper lobe and, to lesser degree, left lower lobe. There is dependent opacity also in the left lower lobe consistent with atelectasis. No pneumothorax. Upper Abdomen: No acute abnormality. Musculoskeletal: There is marked narrowing of the T6-T7 disc space with resorption/irregularity of the lower endplate of T6 in upper endplate of T7, with mild loss of height of these vertebra. This leads to a mild focal kyphosis. This is new since a chest radiograph dated 02/15/2005. A subtle lucency across the T6 spinous process posteriorly suggests a previous fracture. All remaining vertebral bodies are normal in height. No osteoblastic or osteolytic lesions. Review of the MIP images confirms the above findings. IMPRESSION: 1. Patchy areas of consolidation in both lungs consistent with multifocal pneumonia. 2. Complete atelectasis of the right middle lobe and near complete atelectasis of the right lower lobe. Mild dependent atelectasis in the left lower lobe. 3. Large right and small left pleural effusions. 4. Marked loss of the disc space at T6-C7 with irregular resorption of endplates leading  to loss of vertebral body height of T6 and T7 and a focal kyphosis. This may reflect the sequelae of an old fracture.  It could be due to previous discitis/osteomyelitis. Electronically Signed   By: Amie Portland M.D.   On: 08/09/2017 18:44   Mr Brain Wo Contrast  Result Date: 08/14/2017 CLINICAL DATA:  52 y/o  M; stroke follow-up. EXAM: MRI HEAD WITHOUT CONTRAST TECHNIQUE: Axial DWI sequence was acquired. Patient combative, unable to continue examination. COMPARISON:  06/09/2017 MRI head.  06/08/2017 CT head. FINDINGS: Low B value DWI sequence demonstrates areas of T2 hyperintensity in the right greater than left frontal, parietal, and occipital lobes as well as splenium of corpus callosum and cerebellum corresponding to infarctions on prior MRI. The high B value sequence is motion degraded, insufficient to assess for reduced diffusion and acute stroke. IMPRESSION: Severe motion degraded DWI sequence insufficient to assess for acute stroke. Repeat imaging is recommended when patient is able to hold still and follow commands. Electronically Signed   By: Mitzi Hansen M.D.   On: 08/14/2017 21:38   Dg Chest Port 1 View  Result Date: 08/10/2017 CLINICAL DATA:  Post right thora, 1.4 L removed EXAM: PORTABLE CHEST 1 VIEW COMPARISON:  Chest x-ray dated 08/09/2017. FINDINGS: Some improvement in aeration at the left lung base. Residual opacity is presumably atelectasis or pneumonia. Ill-defined opacities persist throughout the left lung, consistent with the multifocal pneumonia better demonstrated on chest CT of 08/09/2017. Additional persistent atelectasis at the left lung base. No pneumothorax. Heart size and mediastinal contours appear stable. IMPRESSION: 1. Improved aeration at the right lung base status post thoracentesis. Residual opacity is presumably atelectasis or pneumonia. No pneumothorax seen. 2. Stable patchy opacities throughout the left lung, compatible with the multifocal pneumonia  demonstrated on yesterday's chest CT. Electronically Signed   By: Bary Richard M.D.   On: 08/10/2017 14:54   Ct Angio Chest/abd/pel For Dissection W And/or W/wo  Result Date: 08/14/2017 CLINICAL DATA:  Septic arterial embolism EXAM: CT ANGIOGRAPHY CHEST, ABDOMEN AND PELVIS TECHNIQUE: Multidetector CT imaging through the chest, abdomen and pelvis was performed using the standard protocol during bolus administration of intravenous contrast. Multiplanar reconstructed images and MIPs were obtained and reviewed to evaluate the vascular anatomy. CONTRAST:  100 cc ISOVUE-370 IOPAMIDOL (ISOVUE-370) INJECTION 76% COMPARISON:  Chest CT 08/09/2017 FINDINGS: CTA CHEST FINDINGS Cardiovascular: No acute pulmonary embolus. Satisfactory opacification of the aorta and pulmonary arteries. Normal size heart without pericardial effusion. Normal branch pattern of the great vessels off the arch. No coronary arteriosclerosis. No aortic atherosclerosis. No aneurysm. No aortic dissection. Mediastinum/Nodes: No supraclavicular, axillary, mediastinal nor hilar lymphadenopathy. Trachea is patent and normal caliber without intraluminal abnormality. Esophagus is unremarkable. No thyromegaly is noted. Lungs/Pleura: Bilateral moderate-sized pleural effusions slightly smaller on the right with loculated component. Adjacent compressive atelectasis is noted. Patchy airspace opacities in the left upper lobe are redemonstrated, with slight improvement in aeration since prior though the vast majority of the pulmonary opacity still persists. Musculoskeletal: Redemonstration of marked disc space narrowing C6-7 with endplate irregularity of the inferior endplate of T6 superior endplate of T7 with resultant mild focal kyphosis. Review of the MIP images confirms the above findings. CTA ABDOMEN AND PELVIS FINDINGS VASCULAR Aorta: Normal caliber aorta without aneurysm, dissection, vasculitis or significant stenosis. Celiac: Patent without evidence of  aneurysm, dissection, vasculitis or significant stenosis. SMA: Patent without evidence of aneurysm, dissection, vasculitis or significant stenosis. Renals: Single renal arteries bilaterally without acute abnormality. No significant stenosis, dissection or evidence of fibromuscular dysplasia. IMA: Patent Inflow: No aneurysm or dissection. Minimal atherosclerotic plaque in the proximal right common iliac and both distal  iliac arteries. No significant stenosis. No dissection or aneurysm. Veins: No obvious venous abnormality within the limitations of this arterial phase study. Review of the MIP images confirms the above findings. NON-VASCULAR Hepatobiliary: No focal liver abnormality is seen. No gallstones, gallbladder wall thickening, or biliary dilatation. Pancreas: Unremarkable. No pancreatic ductal dilatation or surrounding inflammatory changes. Spleen: Heterogeneous enhancement likely due to timing of contrast bolus. No splenomegaly. Adrenals/Urinary Tract: No adrenal no renal masses. No nephrolithiasis nor obstructive uropathy. No hydroureteronephrosis. Unremarkable bladder. Stomach/Bowel: Nondistended stomach with normal small bowel rotation. Mild fluid-filled distention of small bowel loops with slight mural thickening of jejunum may reflect a mild small bowel enteritis. No mechanical source obstruction is seen. Colon is. Normal appearing appendix. Lymphatic: No abdominopelvic adenopathy. Reproductive: Normal size prostate and seminal vesicles. Other: Small amount of fluid in the left inguinal canal. Musculoskeletal: Plate and screw fixation of the left acetabulum. 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 No acute osseous abnormality. There is lower lumbar degenerative facet arthropathy. Minimal anterolisthesis of L4 on L5. Marked disc space narrowing at L5-S1. Review of the MIP images confirms the above findings. IMPRESSION: Chest CT: 1. No acute pulmonary embolus. 2. No aortic aneurysm or dissection.  3. Bilateral moderate-sized pleural effusions slightly smaller on the right with loculated component redemonstrated. Adjacent compressive atelectasis is seen both lower lobes. 4. Relatively unchanged patchy airspace opacity in the left upper lobe only minimally improved in aeration. 5. Redemonstration of focal kyphosis at T6-7 secondary to endplate irregularities and disc space narrowing suspicious for changes of discitis/osteomyelitis. MRI would be the study of choice for further correlation as to acuity. Abdomen and pelvic CT: 1. No aortic aneurysm or dissection. Patent branch vessels without significant stenosis or focal abnormality. 2. Mild fluid-filled distention of small bowel which transmural thickening suspicious for changes of mild enteritis. No mechanical bowel obstruction. Electronically Signed   By: Tollie Eth M.D.   On: 08/14/2017 21:21   US Thoracentesis Asp Pleural Space W/img Guide  Result Date: 08/10/2017 INDICATION: Respiratory failure. Large right pleural effusion. Request for diagnostic and therapeutic thoracentesis. EXAM: ULTRASOUND GUIDED RIGHT THORACENTESIS MEDICATIONS: None. COMPLICATIONS: None immediate. PROCEDURE: An ultrasound guided thoracentesis was thoroughly discussed with the patient and questions answered. The benefits, risks, alternatives and complications were also discussed. The patient understands and wishes to proceed with the procedure. Written consent was obtained. Ultrasound was performed to localize and mark an adequate pocket of fluid in the right chest. The area was then prepped and draped in the normal sterile fashion. 1% Lidocaine was used for local anesthesia. Under ultrasound guidance a Safe-T-Centesis catheter was introduced. Thoracentesis was performed. The catheter was removed and a dressing applied. FINDINGS: A total of approximately 1.4 L of pink/blood-tinged fluid was removed. Samples were sent to the laboratory as requested by the clinical team. IMPRESSION:  Successful ultrasound guided right thoracentesis yielding 1.4 L of pleural fluid. Read by: Brayton El PA-C Electronically Signed   By: Corlis Leak M.D.   On: 08/10/2017 12:55     Oralia Manis, DO 08/15/2017, 12:05 PM PGY-1, [facility] FPTS Intern pager: [phone], text pages welcome

## 2017-08-15 NOTE — Anesthesia Postprocedure Evaluation (Signed)
Anesthesia Post Note  Patient: Marcus [name]  Procedure(s) Performed: TRANSESOPHAGEAL ECHOCARDIOGRAM (TEE) (N/A )     Patient location during evaluation: PACU Anesthesia Type: MAC Level of consciousness: awake and alert Pain management: pain level controlled Vital Signs Assessment: post-procedure vital signs reviewed and stable Respiratory status: spontaneous breathing, nonlabored ventilation, respiratory function stable and patient connected to nasal cannula oxygen Cardiovascular status: stable and blood pressure returned to baseline Postop Assessment: no apparent nausea or vomiting Anesthetic complications: no    Last Vitals:  Vitals:   08/15/17 0730 08/15/17 0741  BP:  (!) 98/59  Pulse:  98  Resp: 19   Temp:    SpO2:  97%    Last Pain:  Vitals:   08/15/17 0400  TempSrc:   PainSc: [place]

## 2017-08-15 NOTE — Progress Notes (Signed)
Later PM Progress Note  Fatigue limiting how much pt able to assist with pivot back to the bed.    08/15/17 1547  PT Visit Information  Last PT Received On 08/15/17  Assistance Needed +2  History of Present Illness Marcus [name] is a 52 y.o. male presenting with worsening SOB. 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   Had 1.4 L fluid removed from R lung via thoracentesis.  Dx w/ HCAP.  Subjective Data  Patient Stated Goal none stated  Pain Assessment  Pain Assessment Faces  Faces Pain Scale 4  Pain Location back  Pain Descriptors / Indicators Grimacing;Guarding  Pain Intervention(s) Monitored during session  Cognition  Arousal/Alertness Awake/alert  Behavior During Therapy Flat affect  Overall Cognitive Status No family/caregiver present to determine baseline cognitive functioning  Bed Mobility  Overal bed mobility Needs Assistance  Bed Mobility Sit to Sidelying  Sit to sidelying Max assist;+2 for safety/equipment  Transfers  Overall transfer level Needs assistance  Equipment used Rolling walker (2 wheeled)  Transfers Sit to/from BJ'sStand;Stand Pivot Transfers  Sit to Stand Mod assist;+2 physical assistance  Stand pivot transfers Max assist;+2 physical assistance  General transfer comment Worked on scooting to edge of the chair and back on the bed with good technique.  Cues fo hand placement and significant assist in to the RW.  Pt having difficulty moving his feet even in a pivotal nature.  Unable to w/bear unilaterally sufficiently to unweight to other extremity.  Balance  Overall balance assessment Needs assistance  Sitting balance-Leahy Scale Poor  Standing balance-Leahy Scale Zero  PT - End of Session  Activity Tolerance Patient limited by fatigue  Patient left in bed;with call bell/phone within reach  Nurse Communication Mobility status  PT - Assessment/Plan  PT Plan Current plan  remains appropriate  PT Visit Diagnosis Other symptoms and signs involving the nervous system (R29.898);Muscle weakness (generalized) (M62.81);Other abnormalities of gait and mobility (R26.89)  PT Frequency (ACUTE ONLY) Min 3X/week  Follow Up Recommendations SNF;Supervision/Assistance - 24 hour  PT equipment Wheelchair cushion (measurements PT);Wheelchair (measurements PT);Hospital bed  AM-PAC PT "6 Clicks" Daily Activity Outcome Measure  Difficulty turning over in bed (including adjusting bedclothes, sheets and blankets)? 1  Difficulty moving from lying on back to sitting on the side of the bed?  1  Difficulty sitting down on and standing up from a chair with arms (e.g., wheelchair, bedside commode, etc,.)? 1  Help needed moving to and from a bed to chair (including a wheelchair)? 1  Help needed walking in hospital room? 1  Help needed climbing 3-5 steps with a railing?  1  6 Click Score 6  Mobility G Code  CN  PT Goal Progression  Progress towards PT goals Progressing toward goals  Acute Rehab PT Goals  PT Goal Formulation Patient unable to participate in goal setting  Time For Goal Achievement 08/25/17  Potential to Achieve Goals Fair  PT Time Calculation  PT Start Time (ACUTE ONLY) 1428  PT Stop Time (ACUTE ONLY) 1444  PT Time Calculation (min) (ACUTE ONLY) 16 min  PT General Charges  $$ ACUTE PT VISIT 1 Visit  PT Treatments  $Therapeutic Activity 8-22 mins   08/15/2017  Briar BingKen Jeneal Vogl, PT [phone]  (pager)

## 2017-08-15 NOTE — Progress Notes (Signed)
Initial Nutrition Assessment  DOCUMENTATION CODES:   Severe malnutrition in context of chronic illness, Underweight  INTERVENTION:    Ensure Enlive po TID, each supplement provides 350 kcal and 20 grams of protein  MVI daily  NUTRITION DIAGNOSIS:   Severe Malnutrition related to chronic illness(polysubstance abuse) as evidenced by severe fat depletion, severe muscle depletion.  GOAL:   Patient will meet greater than or equal to 90% of their needs  MONITOR:   PO intake, Supplement acceptance, Skin, I & O's  REASON FOR ASSESSMENT:   Consult Assessment of nutrition requirement/status  ASSESSMENT:   52 yo male with PMH of arthritis, chronic back pain, endocarditis, MRSA bacteremia, polysubstance abuse, hepatitis C, mitral regurgitation, septic embolism, and malnutrition, who was admitted on 3/9 with worsening dyspnea, sepsis 2/2 HAP and new onset HF. S/P thoracentesis 3/10, 1.4 L removed.    Spoke with patient's family at bedside. They report that patient has been eating poorly for the past week or so. Since admission, his intake has been minimal.   Patient likes Ensure, drank it at home PTA. RN reports he has been drinking some Ensure since admission.  23% weight loss within the past 2 months is significant for the time frame. Suspect that some of this weight loss is related to fluid losses. Patient is underweight with BMI=18.4.  Labs reviewed. Medications reviewed and include Lasix, folic acid, MVI, and thiamine.   NUTRITION - FOCUSED PHYSICAL EXAM:    Most Recent Value  Orbital Region  Moderate depletion  Upper Arm Region  Moderate depletion  Thoracic and Lumbar Region  Severe depletion  Buccal Region  Severe depletion  Temple Region  Severe depletion  Clavicle Bone Region  Moderate depletion  Clavicle and Acromion Bone Region  Severe depletion  Scapular Bone Region  Severe depletion  Dorsal Hand  Unable to assess  Patellar Region  Severe depletion  Anterior  Thigh Region  Severe depletion  Posterior Calf Region  Severe depletion  Edema (RD Assessment)  Mild  Hair  Reviewed  Eyes  Unable to assess  Mouth  Unable to assess  Skin  Reviewed  Nails  Unable to assess       Diet Order:  Diet Heart Room service appropriate? Yes; Fluid consistency: Thin  EDUCATION NEEDS:   No education needs have been identified at this time  Skin:  Skin Assessment: Skin Integrity Issues: Skin Integrity Issues:: Other (Comment) Other: MASD to buttocks  Last BM:  3/15  Height:   Ht Readings from Last 1 Encounters:  08/10/17 5\' 11"  (1.803 m)    Weight:   Wt Readings from Last 1 Encounters:  08/15/17 132 lb 0.9 oz (59.9 kg)    Ideal Body Weight:  78.2 kg  BMI:  Body mass index is 18.42 kg/m.  Estimated Nutritional Needs:   Kcal:  2000-2200  Protein:  90-100 gm  Fluid:  1.5-2 L    Joaquin CourtsKimberly Harris, RD, LDN, CNSC Pager [phone] After Hours Pager [phone]

## 2017-08-15 NOTE — Progress Notes (Addendum)
Upon entering the pts room for morning handoff. Pt found to be on his knees beside the bed in a prayer position. When approached the pt reports that he put himself in that position to "stretch his back". Pt does not report injury however it is felt that this incident constitutes an unwitnessed fall. Pt placed back in bed. All VS stable. Knees assessed with oncoming RN with no obvious injury. Attending MD notified. Pt resting in bed with call bell in reach. Bed alarm on.

## 2017-08-15 NOTE — Progress Notes (Signed)
Physical Therapy Treatment Patient Details Name: Marcus [name] MRN: [id number] DOB: 10/19/1965 Today's Date: 08/15/2017    History of Present Illness Marcus [name] is a [age] y.o. male presenting with worsening SOB. 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   Had 1.4 L fluid removed from R lung via thoracentesis.  Dx w/ HCAP.    PT Comments    Expecting slower progress, pt needing encouragement to participate, but glad of his participation once completed.  Emphasis on transition to sitting EOB, sitting balance, sit to stand and transfer bed to chair with RW.   Follow Up Recommendations  SNF;Supervision/Assistance - 24 hour     Equipment Recommendations  Wheelchair cushion (measurements PT);Wheelchair (measurements PT);Hospital bed    Recommendations for Other Services       Precautions / Restrictions Precautions Precautions: Fall    Mobility  Bed Mobility Overal bed mobility: Needs Assistance Bed Mobility: Rolling;Sidelying to Sit Rolling: Max assist Sidelying to sit: Max assist       General bed mobility comments: cues for sequence of task and truncal assist for roll and up for side.  Transfers Overall transfer level: Needs assistance Equipment used: Rolling walker (2 wheeled) Transfers: Sit to/from UGI CorporationStand;Stand Pivot Transfers Sit to Stand: Mod assist;+2 physical assistance Stand pivot transfers: Max assist;+2 physical assistance       General transfer comment: cues for hand placement and technique, assist to come both forward and boosting up.  Pt needed standing stability..  Stability and w/shift assist during pivoting each foot to chair.  Ambulation/Gait             General Gait Details: pivot in RW only   Stairs            Wheelchair Mobility    Modified Rankin (Stroke Patients Only)       Balance Overall balance assessment: Needs assistance Sitting-balance  support: Single extremity supported;Bilateral upper extremity supported Sitting balance-Leahy Scale: Poor Sitting balance - Comments: can maintain sitting balancewith UE assist   Standing balance support: Bilateral upper extremity supported Standing balance-Leahy Scale: Zero Standing balance comment: stood in RW, needing significant assist for w/shift and moving feet.                            Cognition Arousal/Alertness: Awake/alert Behavior During Therapy: Flat affect Overall Cognitive Status: No family/caregiver present to determine baseline cognitive functioning(not tested formally)                                        Exercises      General Comments        Pertinent Vitals/Pain Pain Assessment: Faces Faces Pain Scale: Hurts little more Pain Location: back Pain Descriptors / Indicators: Grimacing;Guarding Pain Intervention(s): Monitored during session    Home Living                      Prior Function            PT Goals (current goals can now be found in the care plan section) Acute Rehab PT Goals Patient Stated Goal: none stated PT Goal Formulation: Patient unable to participate in goal setting Time For Goal Achievement: 08/25/17 Potential to Achieve Goals: Fair Progress towards PT goals: Progressing toward goals    Frequency  Min 3X/week      PT Plan Current plan remains appropriate    Co-evaluation              AM-PAC PT "6 Clicks" Daily Activity  Outcome Measure  Difficulty turning over in bed (including adjusting bedclothes, sheets and blankets)?: Unable Difficulty moving from lying on back to sitting on the side of the bed? : Unable Difficulty sitting down on and standing up from a chair with arms (e.g., wheelchair, bedside commode, etc,.)?: Unable Help needed moving to and from a bed to chair (including a wheelchair)?: Total Help needed walking in hospital room?: Total Help needed climbing 3-5  steps with a railing? : Total 6 Click Score: 6    End of Session   Activity Tolerance: Treatment limited secondary to agitation;Patient limited by fatigue Patient left: with chair alarm set;with call bell/phone within reach;in chair Nurse Communication: Mobility status PT Visit Diagnosis: Other symptoms and signs involving the nervous system (R29.898);Muscle weakness (generalized) (M62.81);Other abnormalities of gait and mobility (R26.89)     Time: 6578-4696 PT Time Calculation (min) (ACUTE ONLY): 36 min  Charges:  $Therapeutic Activity: 23-37 mins                    G Codes:       01-Sep-2017  [place] Bing, PT [phone]  (pager)   Eliseo Gum [name] September 01, 2017, 3:44 PM

## 2017-08-15 NOTE — Progress Notes (Signed)
Progress Note  Patient Name: Marcus [name] Date of Encounter: 08/15/2017  Primary Cardiologist: Armanda Magic, MD   Subjective   Patient denies any chest pain or shortness of breath this morning.  TEE yesterday showed normal LV function EF 60-65% with a vegetation on the anterior mitral valve leaflet, ruptured anterior leaflet cord with prolapse the leaflet and a 0.5 cm round perforation of the anterior leaflet with severe MR.  Regurgitant orifice by Pisa 0.57 cm and regurgitant volume 54 cc.    Inpatient Medications    Scheduled Meds: . feeding supplement (ENSURE ENLIVE)  237 mL Oral BID BM  . folic acid  1 mg Oral Daily  . LORazepam  0.5 mg Oral TID  . multivitamin with minerals  1 tablet Oral Daily  . mupirocin ointment   Nasal BID  . thiamine  100 mg Oral Daily   Or  . thiamine  100 mg Intravenous Daily   Continuous Infusions:  PRN Meds: acetaminophen, albuterol, fentaNYL (SUBLIMAZE) injection   Vital Signs    Vitals:   08/14/17 2037 08/15/17 0045 08/15/17 0317 08/15/17 0741  BP: 101/71 110/70  (!) 98/59  Pulse: 99 (!) 102  98  Resp: (!) 25 (!) 31    Temp: 99.4 F (37.4 C) 99.3 F (37.4 C)    TempSrc: Oral Oral    SpO2: 99% 100%  97%  Weight:   108 lb 3.9 oz (49.1 kg)   Height:        Intake/Output Summary (Last 24 hours) at 08/15/2017 0803 Last data filed at 08/15/2017 0318 Gross per 24 hour  Intake 280 ml  Output 700 ml  Net -420 ml   Filed Weights   08/13/17 0645 08/14/17 0330 08/15/17 0317  Weight: 132 lb 15 oz (60.3 kg) 128 lb 11.2 oz (58.4 kg) 108 lb 3.9 oz (49.1 kg)    Telemetry    Normal sinus rhythm- Personally Reviewed  ECG    No new EKG review- Personally Reviewed  Physical Exam   GEN: No acute distress.   Neck: No JVD Cardiac: RRR, no rubs, or gallops.  2/6 holosystolic murmur at left lower sternal border and apex. Respiratory: Clear to auscultation bilaterally. GI: Soft, nontender, non-distended  MS: No edema; No  deformity. Neuro:  Nonfocal  Psych: Normal affect   Labs    Chemistry Recent Labs  Lab 08/12/17 0423 08/13/17 0416 08/14/17 0531  NA 137 135 136  K 3.2* 3.5 3.9  CL 96* 95* 99*  CO2 27 29 28   GLUCOSE 107* 103* 98  BUN 13 13 16   CREATININE 1.21 1.14 1.07  CALCIUM 8.1* 8.3* 8.4*  PROT 6.3* 6.5 6.8  ALBUMIN 2.1* 2.1* 2.2*  AST 14* 14* 16  ALT 10* 8* <5*  ALKPHOS 92 97 96  BILITOT 0.8 0.5 0.7  GFRNONAA >60 >60 >60  GFRAA >60 >60 >60  ANIONGAP 14 11 9      Hematology Recent Labs  Lab 08/12/17 0423 08/13/17 0416 08/14/17 0531  WBC 8.8 7.1 7.3  RBC 3.22* 3.29* 3.38*  HGB 9.1* 9.4* 9.7*  HCT 29.1* 29.8* 30.7*  MCV 90.4 90.6 90.8  MCH 28.3 28.6 28.7  MCHC 31.3 31.5 31.6  RDW 14.9 14.9 14.9  PLT 301 293 323    Cardiac Enzymes Recent Labs  Lab 08/10/17 1041 08/10/17 1709 08/11/17 0913  TROPONINI <0.03 <0.03 <0.03    Recent Labs  Lab 08/09/17 1615  TROPIPOC 0.00     BNP Recent Labs  Lab 08/09/17  1607  BNP 449.0*     DDimer  Recent Labs  Lab 08/09/17 1607  DDIMER 4.67*     Radiology    Dg Chest 2 View  Result Date: 08/14/2017 CLINICAL DATA:  Followup pneumonia. EXAM: CHEST - 2 VIEW COMPARISON:  08/10/2016 and older studies. FINDINGS: Moderate right and small left pleural effusions. There is associated lung base opacity, greater on the right, which is consistent with atelectasis, pneumonia or a combination. The patchy airspace opacity noted along the perihilar left lung on the prior study has improved. Airspace opacity in the right mid to lower lung appears somewhat less confluent but is unchanged in extent. No new lung abnormalities.  No pneumothorax. IMPRESSION: 1. Mild improvement in lung consolidation compared to the prior exam. 2. Persistent moderate right and small left pleural effusions. No new abnormalities. Electronically Signed   By: Amie Portland M.D.   On: 08/14/2017 13:16   Mr Brain Wo Contrast  Result Date: 08/14/2017 CLINICAL DATA:   52 y/o  M; stroke follow-up. EXAM: MRI HEAD WITHOUT CONTRAST TECHNIQUE: Axial DWI sequence was acquired. Patient combative, unable to continue examination. COMPARISON:  06/09/2017 MRI head.  06/08/2017 CT head. FINDINGS: Low B value DWI sequence demonstrates areas of T2 hyperintensity in the right greater than left frontal, parietal, and occipital lobes as well as splenium of corpus callosum and cerebellum corresponding to infarctions on prior MRI. The high B value sequence is motion degraded, insufficient to assess for reduced diffusion and acute stroke. IMPRESSION: Severe motion degraded DWI sequence insufficient to assess for acute stroke. Repeat imaging is recommended when patient is able to hold still and follow commands. Electronically Signed   By: Mitzi Hansen M.D.   On: 08/14/2017 21:38   Ct Angio Chest/abd/pel For Dissection W And/or W/wo  Result Date: 08/14/2017 CLINICAL DATA:  Septic arterial embolism EXAM: CT ANGIOGRAPHY CHEST, ABDOMEN AND PELVIS TECHNIQUE: Multidetector CT imaging through the chest, abdomen and pelvis was performed using the standard protocol during bolus administration of intravenous contrast. Multiplanar reconstructed images and MIPs were obtained and reviewed to evaluate the vascular anatomy. CONTRAST:  100 cc ISOVUE-370 IOPAMIDOL (ISOVUE-370) INJECTION 76% COMPARISON:  Chest CT 08/09/2017 FINDINGS: CTA CHEST FINDINGS Cardiovascular: No acute pulmonary embolus. Satisfactory opacification of the aorta and pulmonary arteries. Normal size heart without pericardial effusion. Normal branch pattern of the great vessels off the arch. No coronary arteriosclerosis. No aortic atherosclerosis. No aneurysm. No aortic dissection. Mediastinum/Nodes: No supraclavicular, axillary, mediastinal nor hilar lymphadenopathy. Trachea is patent and normal caliber without intraluminal abnormality. Esophagus is unremarkable. No thyromegaly is noted. Lungs/Pleura: Bilateral moderate-sized  pleural effusions slightly smaller on the right with loculated component. Adjacent compressive atelectasis is noted. Patchy airspace opacities in the left upper lobe are redemonstrated, with slight improvement in aeration since prior though the vast majority of the pulmonary opacity still persists. Musculoskeletal: Redemonstration of marked disc space narrowing C6-7 with endplate irregularity of the inferior endplate of T6 superior endplate of T7 with resultant mild focal kyphosis. Review of the MIP images confirms the above findings. CTA ABDOMEN AND PELVIS FINDINGS VASCULAR Aorta: Normal caliber aorta without aneurysm, dissection, vasculitis or significant stenosis. Celiac: Patent without evidence of aneurysm, dissection, vasculitis or significant stenosis. SMA: Patent without evidence of aneurysm, dissection, vasculitis or significant stenosis. Renals: Single renal arteries bilaterally without acute abnormality. No significant stenosis, dissection or evidence of fibromuscular dysplasia. IMA: Patent Inflow: No aneurysm or dissection. Minimal atherosclerotic plaque in the proximal right common iliac and both distal iliac arteries.  No significant stenosis. No dissection or aneurysm. Veins: No obvious venous abnormality within the limitations of this arterial phase study. Review of the MIP images confirms the above findings. NON-VASCULAR Hepatobiliary: No focal liver abnormality is seen. No gallstones, gallbladder wall thickening, or biliary dilatation. Pancreas: Unremarkable. No pancreatic ductal dilatation or surrounding inflammatory changes. Spleen: Heterogeneous enhancement likely due to timing of contrast bolus. No splenomegaly. Adrenals/Urinary Tract: No adrenal no renal masses. No nephrolithiasis nor obstructive uropathy. No hydroureteronephrosis. Unremarkable bladder. Stomach/Bowel: Nondistended stomach with normal small bowel rotation. Mild fluid-filled distention of small bowel loops with slight mural  thickening of jejunum may reflect a mild small bowel enteritis. No mechanical source obstruction is seen. Colon is. Normal appearing appendix. Lymphatic: No abdominopelvic adenopathy. Reproductive: Normal size prostate and seminal vesicles. Other: Small amount of fluid in the left inguinal canal. Musculoskeletal: Plate and screw fixation of the left acetabulum. 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 No acute osseous abnormality. There is lower lumbar degenerative facet arthropathy. Minimal anterolisthesis of L4 on L5. Marked disc space narrowing at L5-S1. Review of the MIP images confirms the above findings. IMPRESSION: Chest CT: 1. No acute pulmonary embolus. 2. No aortic aneurysm or dissection. 3. Bilateral moderate-sized pleural effusions slightly smaller on the right with loculated component redemonstrated. Adjacent compressive atelectasis is seen both lower lobes. 4. Relatively unchanged patchy airspace opacity in the left upper lobe only minimally improved in aeration. 5. Redemonstration of focal kyphosis at T6-7 secondary to endplate irregularities and disc space narrowing suspicious for changes of discitis/osteomyelitis. MRI would be the study of choice for further correlation as to acuity. Abdomen and pelvic CT: 1. No aortic aneurysm or dissection. Patent branch vessels without significant stenosis or focal abnormality. 2. Mild fluid-filled distention of small bowel which transmural thickening suspicious for changes of mild enteritis. No mechanical bowel obstruction. Electronically Signed   By: Tollie Eth M.D.   On: 08/14/2017 21:21    Cardiac Studies   TEE 08/15/2017 Study Conclusions  - Left ventricle: The cavity size was normal. There was mild   concentric hypertrophy. Systolic function was normal. The   estimated ejection fraction was in the range of 60% to 65%. - Aortic valve: Trileaflet; mildly thickened leaflets. There was   trivial regurgitation. - Mitral valve: Vegetation  of the anterior mitral leaflet. Ruptured is  anterior leaflet cord with prolaspe of the leaflet. There is also   a 0.5 cm round perforation of the anterior leaflet with severe   regurgitation - seen well in 2D and 3D modes. Regurgitant VTI: 94   cm. Effective regurgitant orifice (PISA): 0.57 cm^2. Regurgitant   volume (PISA): 54 ml. - Left atrium: No evidence of thrombus in the atrial cavity or   appendage. - Right atrium: The atrium was dilated. - Atrial septum: No defect or patent foramen ovale was identified.  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   CT surgical evaluation is recommended.   Patient Profile     52 y.o. male with a hx of bacteremia with septic embolic and MV endocarditiswho is being seen today for the evaluation ofSOBand 2D echo this admit with worsening MR.  Assessment & Plan    1.  Endocarditis with severe MR and acute diastolic heart failure secondary to perforation of the anterior mitral valve leaflet and multiple ruptured primary chordae tendonae - anterior MV leaflet vegetation with mild MR by echo  Jan 2019but now has progressed to severe MR this admit due to continued valvular destruction -TEE yesterday showed normal LV function EF 60-65% with a vegetation on the anterior mitral valve leaflet, ruptured anterior leaflet cord with prolapse the leaflet and a 0.5 cm round perforation of the anterior leaflet with severe MR.  Regurgitant orifice by Pisa 0.57 cm and regurgitant volume 54 cc.   -Appreciate CTS consult. -Follow-up blood cultures thus far show no growth but given progression of valvular disease there is concern he could still have underlying infection.  We will ask again for ID input on whether this patient should be on antibiotics currently. -TEE yesterday showed normal LV function EF 60-65% with a vegetation on the anterior mitral valve  leaflet, ruptured anterior leaflet cord with prolapse the leaflet and a 0.5 cm round perforation of the anterior leaflet with severe MR.  Regurgitant orifice by Pisa 0.57 cm and regurgitant volume 54 cc.   -CVTS has recommended that patient be tuned up before surgery contemplated. -An MRI of the brain to follow-up on recent stroke was insufficient due to severe motion degradation and repeat MRI is recommended once patient can follow commands. -Chest CT yesterday showed concern over changes of discitis and osteomyelitis at T6-7.  The patient has been having a lot of back pain.  I will order an MRI of the spine to further evaluate for possible discitis/osteomyelitis.    2. Acute diastolic heart failure secondary to mitral valve endocarditis with progression of mitral valve damage including perforation of the anterior mitral valve leaflet and multiple ruptured chordae tendinae. This is led to worsening mitral regurgitation and subsequent heart failure. -Chest x-ray yesterday showed mild improvement in lung consolidation but moderate right and small left pleural effusions persist. -I am going to ask pulmonary medicine to consult in regards to repeat thoracentesis.  I would like their input on the persistent lung consolidation as well with relatively unchanged patchy airspace opacity in the left upper lobe -He put out 700cc yesterday and is net neg 5.1 L. Unsure if weights are accurate.  He weighed 150lbs on admission and is now 108 lbs with 20 lb weight loss in the past day despite only mild to moderate urine output -Creatinine is stable at 1.07 -We will start Lasix 40 mg IV twice daily as blood pressure allows -internal medicine had stopped diuretics due to soft blood pressures  3. Anemia - per primary team -Hemoglobin stable at 9.4  4. Pleural effusion/multifocal PNA - 1.4 L removed from right lung this admission  -Again will get IDs input again on whether patient should remain on antibiotics  and I have asked pulmonary medicine to see as well to comment on persistent left upper lobe opacity as well as evaluate for thoracentesis  5.  Malnutrition -The patient's nutritional status is extremely poor. -Albumin yesterday was 2.2. -I will get a nutrition consult  I have spent a total of 40 minutes with patient reviewing hospital notes, TEE, CVTS note , telemetry, EKGs, labs and examining patient as well as establishing an assessment and plan that was discussed with the patient.  > 50% of time was spent in direct patient care.    For questions or updates, please contact [facility] Please consult [email or web address] for contact info under Cardiology/STEMI.      Signed, Armanda Magicraci Cierah Crader, MD  08/15/2017, 8:03 AM

## 2017-08-15 NOTE — Progress Notes (Signed)
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  He was sent to the emergency room 3/9 because of worsening dyspnea. In the emergency room patient got vancomycin and cefepime.  He was placed on BiPAP for hypoxia with subsequent improvement in oxygenation.  CT pulmonary angiogram protocol CT scan excluded PE.  He had a large right pleural effusion and small left pleural effusion.  There was evidence of multifocal pneumonia as well as complete atelectasis of the right lower lobe and partially of the right middle lobe.   He had right sided thoracentesis on 3/10 with 1.4L hazy pink / blood tinged fluid removed, exudative per LDH.  As of 3/11, thora labs are still pending.   Subjective: 08/15/2017 pulmonary critical care called to the bedside to reevaluate for residual pneumonia.  He is awake alert no acute distress.   Vitals: BP (!) 98/59 (BP Location: Left Arm)   Pulse 98   Temp 99.3 F (37.4 C) (Oral)   Resp 19   Ht 5\' 11"  (1.803 m)   Wt 49.1 kg (108 lb 3.9 oz)   SpO2 97%   BMI 15.10 kg/m   Physical Exam: General: Cachectic white male no acute distress at rest HEENT: No JVD or lymphadenopathy is appreciated PSY: Strange effect Neuro: Follows commands, complains of generalized pain, requesting pain medications. CV: Heart sounds are regular with 5/6 systolic ejection murmur PULM: Mild rhonchi decreased breath sounds in the bases ZO:XWRUGI:soft, non-tender, bsx4 active  Extremities: warm/dry, negative edema  Skin: no rashes or lesions   CMP Latest Ref Rng & Units 08/14/2017 08/13/2017 08/12/2017  Glucose 65 - 99 mg/dL 98 045(W103(H) 098(J107(H)  BUN 6 - 20 mg/dL 16 13 13   Creatinine 0.61 - 1.24  mg/dL 1.911.07 4.781.14 2.951.21  Sodium 135 - 145 mmol/L 136 135 137  Potassium 3.5 - 5.1 mmol/L 3.9 3.5 3.2(L)  Chloride 101 - 111 mmol/L 99(L) 95(L) 96(L)  CO2 22 - 32 mmol/L 28 29 27   Calcium 8.9 - 10.3 mg/dL 6.2(Z8.4(L) 8.3(L) 8.1(L)  Total Protein 6.5 - 8.1 g/dL 6.8 6.5 6.3(L)  Total Bilirubin 0.3 - 1.2 mg/dL 0.7 0.5 0.8  Alkaline Phos 38 - 126 U/L 96 97 92  AST 15 - 41 U/L 16 14(L) 14(L)  ALT 17 - 63 U/L <5(L) 8(L) 10(L)   CBC Latest Ref Rng & Units 08/14/2017 08/13/2017 08/12/2017  WBC 4.0 - 10.5 K/uL 7.3 7.1 8.8  Hemoglobin 13.0 - 17.0 g/dL 3.0(Q9.7(L) 6.5(H9.4(L) 8.4(O9.1(L)  Hematocrit 39.0 - 52.0 % 30.7(L) 29.8(L) 29.1(L)  Platelets 150 - 400 K/uL 323 293 301      Dg Orthopantogram  Result Date: 08/15/2017 CLINICAL DATA:  Poor dentition.  Cardiac workup. EXAM: ORTHOPANTOGRAM/PANORAMIC COMPARISON:  None. FINDINGS: There are numerous missing maxillary and mandibular teeth, and multiple remaining maxillary teeth appear broken. There is mild periapical lucency associated with a left maxillary premolar tooth. IMPRESSION: Poor dentition with multiple missing and broken teeth. Mild periapical lucency associated with a left maxillary premolar tooth, abscess not excluded. Electronically Signed   By: Sebastian AcheAllen  Grady M.D.   On: 08/15/2017 09:12   Dg Chest 2 View  Result Date: 08/14/2017 CLINICAL DATA:  Followup pneumonia. EXAM: CHEST - 2 VIEW COMPARISON:  08/10/2016 and older studies. FINDINGS: Moderate  right and small left pleural effusions. There is associated lung base opacity, greater on the right, which is consistent with atelectasis, pneumonia or a combination. The patchy airspace opacity noted along the perihilar left lung on the prior study has improved. Airspace opacity in the right mid to lower lung appears somewhat less confluent but is unchanged in extent. No new lung abnormalities.  No pneumothorax. IMPRESSION: 1. Mild improvement in lung consolidation compared to the prior exam. 2. Persistent moderate right and  small left pleural effusions. No new abnormalities. Electronically Signed   By: Amie Portland M.D.   On: 08/14/2017 13:16   Mr Brain Wo Contrast  Result Date: 08/14/2017 CLINICAL DATA:  52 y/o  M; stroke follow-up. EXAM: MRI HEAD WITHOUT CONTRAST TECHNIQUE: Axial DWI sequence was acquired. Patient combative, unable to continue examination. COMPARISON:  06/09/2017 MRI head.  06/08/2017 CT head. FINDINGS: Low B value DWI sequence demonstrates areas of T2 hyperintensity in the right greater than left frontal, parietal, and occipital lobes as well as splenium of corpus callosum and cerebellum corresponding to infarctions on prior MRI. The high B value sequence is motion degraded, insufficient to assess for reduced diffusion and acute stroke. IMPRESSION: Severe motion degraded DWI sequence insufficient to assess for acute stroke. Repeat imaging is recommended when patient is able to hold still and follow commands. Electronically Signed   By: Mitzi Hansen M.D.   On: 08/14/2017 21:38   Ct Angio Chest/abd/pel For Dissection W And/or W/wo  Result Date: 08/14/2017 CLINICAL DATA:  Septic arterial embolism EXAM: CT ANGIOGRAPHY CHEST, ABDOMEN AND PELVIS TECHNIQUE: Multidetector CT imaging through the chest, abdomen and pelvis was performed using the standard protocol during bolus administration of intravenous contrast. Multiplanar reconstructed images and MIPs were obtained and reviewed to evaluate the vascular anatomy. CONTRAST:  100 cc ISOVUE-370 IOPAMIDOL (ISOVUE-370) INJECTION 76% COMPARISON:  Chest CT 08/09/2017 FINDINGS: CTA CHEST FINDINGS Cardiovascular: No acute pulmonary embolus. Satisfactory opacification of the aorta and pulmonary arteries. Normal size heart without pericardial effusion. Normal branch pattern of the great vessels off the arch. No coronary arteriosclerosis. No aortic atherosclerosis. No aneurysm. No aortic dissection. Mediastinum/Nodes: No supraclavicular, axillary, mediastinal nor  hilar lymphadenopathy. Trachea is patent and normal caliber without intraluminal abnormality. Esophagus is unremarkable. No thyromegaly is noted. Lungs/Pleura: Bilateral moderate-sized pleural effusions slightly smaller on the right with loculated component. Adjacent compressive atelectasis is noted. Patchy airspace opacities in the left upper lobe are redemonstrated, with slight improvement in aeration since prior though the vast majority of the pulmonary opacity still persists. Musculoskeletal: Redemonstration of marked disc space narrowing C6-7 with endplate irregularity of the inferior endplate of T6 superior endplate of T7 with resultant mild focal kyphosis. Review of the MIP images confirms the above findings. CTA ABDOMEN AND PELVIS FINDINGS VASCULAR Aorta: Normal caliber aorta without aneurysm, dissection, vasculitis or significant stenosis. Celiac: Patent without evidence of aneurysm, dissection, vasculitis or significant stenosis. SMA: Patent without evidence of aneurysm, dissection, vasculitis or significant stenosis. Renals: Single renal arteries bilaterally without acute abnormality. No significant stenosis, dissection or evidence of fibromuscular dysplasia. IMA: Patent Inflow: No aneurysm or dissection. Minimal atherosclerotic plaque in the proximal right common iliac and both distal iliac arteries. No significant stenosis. No dissection or aneurysm. Veins: No obvious venous abnormality within the limitations of this arterial phase study. Review of the MIP images confirms the above findings. NON-VASCULAR Hepatobiliary: No focal liver abnormality is seen. No gallstones, gallbladder wall thickening, or biliary dilatation. Pancreas: Unremarkable. No pancreatic ductal dilatation or surrounding  inflammatory changes. Spleen: Heterogeneous enhancement likely due to timing of contrast bolus. No splenomegaly. Adrenals/Urinary Tract: No adrenal no renal masses. No nephrolithiasis nor obstructive uropathy. No  hydroureteronephrosis. Unremarkable bladder. Stomach/Bowel: Nondistended stomach with normal small bowel rotation. Mild fluid-filled distention of small bowel loops with slight mural thickening of jejunum may reflect a mild small bowel enteritis. No mechanical source obstruction is seen. Colon is. Normal appearing appendix. Lymphatic: No abdominopelvic adenopathy. Reproductive: Normal size prostate and seminal vesicles. Other: Small amount of fluid in the left inguinal canal. Musculoskeletal: Plate and screw fixation of the left acetabulum. 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 No acute osseous abnormality. There is lower lumbar degenerative facet arthropathy. Minimal anterolisthesis of L4 on L5. Marked disc space narrowing at L5-S1. Review of the MIP images confirms the above findings. IMPRESSION: Chest CT: 1. No acute pulmonary embolus. 2. No aortic aneurysm or dissection. 3. Bilateral moderate-sized pleural effusions slightly smaller on the right with loculated component redemonstrated. Adjacent compressive atelectasis is seen both lower lobes. 4. Relatively unchanged patchy airspace opacity in the left upper lobe only minimally improved in aeration. 5. Redemonstration of focal kyphosis at T6-7 secondary to endplate irregularities and disc space narrowing suspicious for changes of discitis/osteomyelitis. MRI would be the study of choice for further correlation as to acuity. Abdomen and pelvic CT: 1. No aortic aneurysm or dissection. Patent branch vessels without significant stenosis or focal abnormality. 2. Mild fluid-filled distention of small bowel which transmural thickening suspicious for changes of mild enteritis. No mechanical bowel obstruction. Electronically Signed   By: Tollie Eth M.D.   On: 08/14/2017 21:21     Assessment and Plan: 52 year old male with a history of IV drug abuse who had on 3/10 right thoracentesis with 1.4 L of fluid obtained.  He has known mitral valve regurgitation  with leaflet injuries and vegetation per TEE performed on 08/14/2017..  Pulmonary critical care was called to reevaluate on 08/15/2017 question variant does he have a residual pneumonia. He has been followed by infectious disease and had a cultures were redrawn today 08/15/2017. Being followed by cardiothoracic surgery for questionable replacement of mitral valve.  Assessment: Hypoxic respiratory failure currently on 2 L nasal cannula Persistent right and left pleural effusions status post 1.4 L being drained from the right pleural effusion. Right pleural effusion is loculated. Recent pneumonia treated per ID  Generalized failure to thrive Mitral valve regurgitation with questionable plan for vascular surgery per CV TS  Plan:  Antibiotics per ID Wean oxygen as tolerated Doubtful to repeat thoracentesis would be of benefit. Due to his weakened state and generalized failure to thrive he will have surgical risks. Removed BiPAP machine from room as he is not use it for the last 72 hours    Brett Canales Minor ACNP [facility] Pager [phone] till 1 pm If no answer page [phone] 08/15/2017, 11:04 AM

## 2017-08-15 NOTE — Progress Notes (Signed)
[facility]  Date of Admission:  08/09/2017             ASSESSMENT/PLAN  Mitral valve endocarditis - Vegetation noted on TEE with concern for incomplete resolution of MSSA endocarditis. Blood cultures remain with no growth to date x 4 days. He has been afebrile. Will plan to treat with Ancef for an additional 6 weeks, although unsure if significant improvement will be obtained. He will require a new PICC which may be placed at any time and likely will require a SNF once again for the duration of his treatment secondary to continued drug use. End date for Ancef will be 09/24/17.  Back pain - Agree with imaging to rule out osteomyelitis/discitis in the setting of IVDU. Imaging ordered. Will continue to follow.    Principal Problem:   Severe mitral regurgitation Active Problems:   Sepsis (HCC)   Endocarditis of mitral valve   Hepatitis C antibody positive in blood   Bacterial endocarditis   Acute heart failure (HCC)   Pleural effusion, bilateral   HCAP (healthcare-associated pneumonia)   . feeding supplement (ENSURE ENLIVE)  237 mL Oral BID BM  . folic acid  1 mg Oral Daily  . furosemide  40 mg Intravenous BID  . LORazepam  0.5 mg Oral TID  . multivitamin with minerals  1 tablet Oral Daily  . mupirocin ointment   Nasal BID  . thiamine  100 mg Oral Daily   Or  . thiamine  100 mg Intravenous Daily    SUBJECTIVE:  TEE performed yesterday with vegitation noted on the anterior mitral leaflet with a 0.5 cm round perforation of the anterior leaflet with severe regurgitation. This confirms continued endocarditis. CVTS recommends additional treatment for endocarditis as well as concer for discitis and possible osteomyelitis. Goal would be for medical stabilization prior to any surgical intervention.  He has remained afebrile overnight with continued back pain. No other concerns overnight.   No Known Allergies   Review of Systems: Review of Systems    Constitutional: Negative for chills and fever.  Respiratory: Negative for cough, shortness of breath and wheezing.   Cardiovascular: Negative for chest pain and leg swelling.  Gastrointestinal: Negative for abdominal pain, constipation, diarrhea, nausea and vomiting.  Musculoskeletal: Positive for back pain.  Skin: Negative for rash.  Neurological: Negative for weakness.      OBJECTIVE: Vitals:   08/15/17 0045 08/15/17 0317 08/15/17 0730 08/15/17 0741  BP: 110/70   (!) 98/59  Pulse: (!) 102   98  Resp: (!) 31  19   Temp: 99.3 F (37.4 C)     TempSrc: Oral     SpO2: 100%   97%  Weight:  108 lb 3.9 oz (49.1 kg)    Height:       Body mass index is 15.1 kg/m.  Physical Exam  Constitutional: No distress.  Cardiovascular: Normal rate, regular rhythm and intact distal pulses. Exam reveals no gallop and no friction rub.  Murmur heard. Pulmonary/Chest: Effort normal and breath sounds normal. No respiratory distress. He has no wheezes. He has no rales. He exhibits no tenderness.  Neurological: He is alert.  Skin: Skin is warm and dry.  Psychiatric: Affect normal.    Lab Results Lab Results  Component Value Date   WBC 7.3 08/14/2017   HGB 9.7 (L) 08/14/2017   HCT 30.7 (L) 08/14/2017   MCV 90.8 08/14/2017   PLT 323 08/14/2017    Lab Results  Component  Value Date   CREATININE 1.07 08/14/2017   BUN 16 08/14/2017   NA 136 08/14/2017   K 3.9 08/14/2017   CL 99 (L) 08/14/2017   CO2 28 08/14/2017    Lab Results  Component Value Date   ALT <5 (L) 08/14/2017   AST 16 08/14/2017   ALKPHOS 96 08/14/2017   BILITOT 0.7 08/14/2017     Microbiology: Recent Results (from the past 240 hour(s))  Culture, blood (Routine x 2)     Status: None   Collection Time: 08/09/17  2:25 PM  Result Value Ref Range Status   Specimen Description BLOOD LEFT WRIST  Final   Special Requests IN PEDIATRIC BOTTLE Blood Culture adequate volume  Final   Culture   Final    NO GROWTH 5  DAYS Performed at [facility], [address] [place], [place] [address]    Report Status 08/14/2017 FINAL  Final  Culture, blood (Routine x 2)     Status: None   Collection Time: 08/09/17  2:48 PM  Result Value Ref Range Status   Specimen Description BLOOD RIGHT HAND  Final   Special Requests IN PEDIATRIC BOTTLE Blood Culture adequate volume  Final   Culture   Final    NO GROWTH 5 DAYS Performed at [facility], [address], [place], [place] [address]    Report Status 08/14/2017 FINAL  Final  Urine culture     Status: Abnormal   Collection Time: 08/09/17  3:50 PM  Result Value Ref Range Status   Specimen Description URINE, CLEAN CATCH  Final   Special Requests   Final    NONE Performed at [facility], [address], [place], [place] [address]    Culture MULTIPLE SPECIES PRESENT, SUGGEST RECOLLECTION (A)  Final   Report Status 08/10/2017 FINAL  Final  Respiratory Panel by PCR     Status: None   Collection Time: 08/09/17  4:00 PM  Result Value Ref Range Status   Adenovirus NOT DETECTED NOT DETECTED Final   Coronavirus 229E NOT DETECTED NOT DETECTED Final   Coronavirus HKU1 NOT DETECTED NOT DETECTED Final   Coronavirus NL63 NOT DETECTED NOT DETECTED Final   Coronavirus OC43 NOT DETECTED NOT DETECTED Final   Metapneumovirus NOT DETECTED NOT DETECTED Final   Rhinovirus / Enterovirus NOT DETECTED NOT DETECTED Final   Influenza A NOT DETECTED NOT DETECTED Final   Influenza B NOT DETECTED NOT DETECTED Final   Parainfluenza Virus 1 NOT DETECTED NOT DETECTED Final   Parainfluenza Virus 2 NOT DETECTED NOT DETECTED Final   Parainfluenza Virus 3 NOT DETECTED NOT DETECTED Final   Parainfluenza Virus 4 NOT DETECTED NOT DETECTED Final   Respiratory Syncytial Virus NOT DETECTED NOT DETECTED Final   Bordetella pertussis NOT DETECTED NOT DETECTED Final   Chlamydophila pneumoniae NOT DETECTED NOT DETECTED Final   Mycoplasma pneumoniae NOT DETECTED NOT DETECTED Final     Comment: Performed at [facility], [address], [place], [place] [address]  Culture, blood (routine x 2) Call MD if unable to obtain prior to antibiotics being given     Status: None (Preliminary result)   Collection Time: 08/10/17  1:10 AM  Result Value Ref Range Status   Specimen Description BLOOD RIGHT HAND  Final   Special Requests IN PEDIATRIC BOTTLE Blood Culture adequate volume  Final   Culture   Final    NO GROWTH 4 DAYS Performed at [facility], [address] [place], [place] [address]    Report Status PENDING  Incomplete  Culture, blood (routine x 2) Call MD if unable to obtain prior to antibiotics being given     Status: None (Preliminary result)   Collection Time: 08/10/17  1:20 AM  Result Value Ref Range Status   Specimen Description BLOOD RIGHT ARM  Final   Special Requests   Final    BOTTLES DRAWN AEROBIC AND ANAEROBIC Blood Culture adequate volume   Culture   Final    NO GROWTH 4 DAYS Performed at [facility], [address] [place], [place] [address]    Report Status PENDING  Incomplete  Fungus Culture With Stain     Status: None (Preliminary result)   Collection Time: 08/10/17 12:59 PM  Result Value Ref Range Status   Fungus Stain Final report  Final    Comment: (NOTE) Performed At: [facility] [address] [place], [place] [id number] Jolene SchimkeNagendra Sanjai MD WG:[id number]:[phone]    Fungus (Mycology) Culture PENDING  Incomplete   Fungal Source PLEURAL  Final    Comment: RIGHT Performed at [facility], [address] [place], [place] [address]   Acid Fast Smear (AFB)     Status: None   Collection Time: 08/10/17 12:59 PM  Result Value Ref Range Status   AFB Specimen Processing Concentration  Final   Acid Fast Smear Negative  Final    Comment: (NOTE) Performed At: [facility] [address] [place], [place] [id number] Jolene SchimkeNagendra Sanjai MD KG:[id number]:[phone]    Source (AFB) PLEURAL  Final    Comment: RIGHT Performed at  [facility], [address] [place], [place] [address]   Culture, body fluid-bottle     Status: None (Preliminary result)   Collection Time: 08/10/17 12:59 PM  Result Value Ref Range Status   Specimen Description PLEURAL RIGHT  Final   Special Requests NONE  Final   Culture   Final    NO GROWTH 4 DAYS Performed at [facility], [address] [place], [place] [address]    Report Status PENDING  Incomplete  Gram stain     Status: None   Collection Time: 08/10/17 12:59 PM  Result Value Ref Range Status   Specimen Description PLEURAL RIGHT  Final   Special Requests NONE  Final   Gram Stain   Final    FEW WBC PRESENT,BOTH PMN AND MONONUCLEAR NO ORGANISMS SEEN Performed at [facility], [address] [place], [place] [address]    Report Status 08/10/2017 FINAL  Final  Fungus Culture Result     Status: None   Collection Time: 08/10/17 12:59 PM  Result Value Ref Range Status   Result 1 Comment  Final    Comment: (NOTE) KOH/Calcofluor preparation:  no fungus observed. Performed At: [facility] [place] [address] [place], [place] [id number] Jolene SchimkeNagendra Sanjai MD JO:[id number]:[phone] Performed at [facility], [address] [place], [place] [address]   MRSA PCR Screening     Status: Abnormal   Collection Time: 08/11/17  6:18 AM  Result Value Ref Range Status   MRSA by PCR POSITIVE (A) NEGATIVE Final    Comment:        The GeneXpert MRSA Assay (FDA approved for NASAL specimens only), is one component of a comprehensive MRSA colonization surveillance program. It is not intended to diagnose MRSA infection nor to guide or monitor treatment for MRSA infections. RESULT CALLED TO, READ BACK BY  AND VERIFIED WITH: RN Osa Craver [id number] [phone] MLM Performed at [facility], [address] [place], [place] [address]      Marcos Eke, NP [facility] [phone] Pager  08/15/2017  8:36 AM

## 2017-08-16 DIAGNOSIS — E43 Unspecified severe protein-calorie malnutrition: Secondary | ICD-10-CM

## 2017-08-16 LAB — CBC
HCT: 32.9 % — ABNORMAL LOW (ref 39.0–52.0)
HEMOGLOBIN: 10.2 g/dL — AB (ref 13.0–17.0)
MCH: 28.1 pg (ref 26.0–34.0)
MCHC: 31 g/dL (ref 30.0–36.0)
MCV: 90.6 fL (ref 78.0–100.0)
PLATELETS: 289 10*3/uL (ref 150–400)
RBC: 3.63 MIL/uL — AB (ref 4.22–5.81)
RDW: 14.6 % (ref 11.5–15.5)
WBC: 9.1 10*3/uL (ref 4.0–10.5)

## 2017-08-16 LAB — COMPREHENSIVE METABOLIC PANEL
ALBUMIN: 2.3 g/dL — AB (ref 3.5–5.0)
ALK PHOS: 101 U/L (ref 38–126)
ANION GAP: 11 (ref 5–15)
AST: 20 U/L (ref 15–41)
BUN: 15 mg/dL (ref 6–20)
CALCIUM: 8.4 mg/dL — AB (ref 8.9–10.3)
CHLORIDE: 95 mmol/L — AB (ref 101–111)
CO2: 26 mmol/L (ref 22–32)
Creatinine, Ser: 1.15 mg/dL (ref 0.61–1.24)
GFR calc Af Amer: >60 mL/min (ref >60)
GFR calc non Af Amer: >60 mL/min (ref >60)
GLUCOSE: 95 mg/dL (ref 65–99)
Potassium: 3.9 mmol/L (ref 3.5–5.1)
SODIUM: 132 mmol/L — AB (ref 135–145)
Total Bilirubin: 0.4 mg/dL (ref 0.3–1.2)
Total Protein: 6.8 g/dL (ref 6.5–8.1)

## 2017-08-16 MED ORDER — OXYCODONE-ACETAMINOPHEN 5-325 MG PO TABS
1.0000 | ORAL_TABLET | Freq: Four times a day (QID) | ORAL | Status: DC
  Administered 2017-08-16 – 2017-08-21 (×18): 1 via ORAL
  Filled 2017-08-16 (×18): qty 1

## 2017-08-16 MED ORDER — LORAZEPAM 2 MG/ML IJ SOLN: 1.0000 mg | Freq: Once | INTRAMUSCULAR | Status: DC | PRN

## 2017-08-16 MED ORDER — LORAZEPAM 1 MG PO TABS: 1.0000 mg | ORAL_TABLET | ORAL | Status: DC | PRN

## 2017-08-16 MED ORDER — LORAZEPAM 0.5 MG PO TABS
0.5000 mg | ORAL_TABLET | Freq: Every day | ORAL | Status: DC
  Administered 2017-08-17: 0.5 mg via ORAL
  Filled 2017-08-16: qty 1

## 2017-08-16 MED ORDER — FENTANYL CITRATE (PF) 100 MCG/2ML IJ SOLN
12.5000 ug | Freq: Four times a day (QID) | INTRAMUSCULAR | Status: DC | PRN
  Administered 2017-08-16 – 2017-08-18 (×4): 12.5 ug via INTRAVENOUS
  Filled 2017-08-16 (×4): qty 2

## 2017-08-16 MED ORDER — LORAZEPAM 2 MG/ML IJ SOLN
1.0000 mg | Freq: Once | INTRAMUSCULAR | Status: DC
  Filled 2017-08-16 (×2): qty 1

## 2017-08-16 NOTE — Progress Notes (Signed)
Subjective:  He is calmer today and more alert.  No shortness of breath or chest pain.  Previous TEE shows severe mitral regurgitation with a perforation and of agitation with a ruptured anterior leaflet cord.  He will  need surgery for this.  Objective:  Vital Signs in the last 24 hours: BP 114/76   Pulse 87   Temp 98.2 F (36.8 C) (Oral)   Resp 17   Ht 5\' 11"  (1.803 m)   Wt 58.3 kg (128 lb 8.5 oz)   SpO2 98%   BMI 17.93 kg/m   Physical Exam: Somewhat disheveled male lying on side in bed in no acute distress currently incontinent Lungs:  Clear Cardiac:  Regular rhythm, normal S1 and S2, no S3, 2/6 systolic murmur of mitral regurgitation Extremities:  No edema present  Intake/Output from previous day: 03/15 0701 - 03/16 0700 In: 1260 [P.O.:1060; IV Piggyback:200] Out: 1900 [Urine:1900]  Weight Filed Weights   08/15/17 0317 08/15/17 1200 08/16/17 0405  Weight: 49.1 kg (108 lb 3.9 oz) 59.9 kg (132 lb 0.9 oz) 58.3 kg (128 lb 8.5 oz)    Lab Results: Basic Metabolic Panel: Recent Labs    08/15/17 1054 08/16/17 0357  NA 136 132*  K 4.2 3.9  CL 101 95*  CO2 26 26  GLUCOSE 124* 95  BUN 14 15  CREATININE 1.16 1.15   CBC: Recent Labs    08/15/17 1054 08/16/17 0357  WBC 5.4 9.1  HGB 10.0* 10.2*  HCT 32.1* 32.9*  MCV 92.0 90.6  PLT 298 289   Telemetry: Currently sinus rhythm  Assessment/Plan:  1.  Severe mitral regurgitation due to endocarditis and leaflet perforation 2.  Acute diastolic heart failure started back on diuretics yesterday but is incontinent 3.  Pleural effusion and infiltrates 4.  Malnutrition  Recommendations:  Critical care note read and will plan to continue to follow cardiac-wise.  Has a number of issues that need to be addressed prior to potential surgery.    Darden PalmerW. Spencer Thyra Yinger, Jr.  MD [facility] Card[facility]  08/16/2017, 11:31 AM

## 2017-08-16 NOTE — Progress Notes (Signed)
Occupational Therapy Treatment Patient Details Name: Marcus [name]: [id number] DOB: Oct 16, 1965 Today's Date: 08/16/2017    History of present illness Marcus [name] is a 52 y.o. male presenting with worsening SOB. 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   Had 1.4 L fluid removed from R lung via thoracentesis.  Dx w/ HCAP.   OT comments  Pt received awake and supine in bed. Pt verbalizing frustration with self feeding due to poor grasp strength. Providing pt with built-up handles to increase independence with self feeding and grooming tasks. Educating pt on management of built up handles and proper positioning for self feeding. Pt requiring increased verbal cues for problem solving self feeding tasks and to increase functional performance. Continue to recommend dc to SNF for further OT and will continue to follow as admitted.    Follow Up Recommendations  SNF;Supervision/Assistance - 24 hour    Equipment Recommendations  Other (comment)(defer to next venue of care)    Recommendations for Other Services      Precautions / Restrictions Precautions Precautions: Fall Restrictions Weight Bearing Restrictions: No       Mobility Bed Mobility Overal bed mobility: Needs Assistance             General bed mobility comments: Min A to sliding up in bed to optimize positioning for self feeding  Transfers                      Balance                                           ADL either performed or assessed with clinical judgement   ADL Overall ADL's : Needs assistance/impaired Eating/Feeding: Set up;Bed level;Cueing for sequencing;Cueing for compensatory techinques Eating/Feeding Details (indicate cue type and reason): Pt presenting poor grasp strength (L<R). Providing pt with built up handles for self feeding. Pt placing handle on spoon with Min VCs for sequencing.  Eating apple sauce with cues for problem solving. Educating pt on optimizing sitting positioning to maintain upright posture during feeding.                                    General ADL Comments: Focused session on adaptive equipment to optimize self feeding and idnependence. Pt very thanksful for built up handles. COntinue to require Max A for additional ADLs.      Vision       Perception     Praxis      Cognition Arousal/Alertness: Awake/alert Behavior During Therapy: Flat affect Overall Cognitive Status: No family/caregiver present to determine baseline cognitive functioning Area of Impairment: Attention;Following commands;Safety/judgement;Awareness                   Current Attention Level: Sustained   Following Commands: Follows one step commands inconsistently;Follows one step commands with increased time Safety/Judgement: Decreased awareness of safety;Decreased awareness of deficits Awareness: Emergent   General Comments: During self feeding, pt demosntrating decreased problem solving. Required Max cues to adapt and optimize sequencing. For example, pt scooping apple sauce with RUE and apple sauce cup sliding around table. Providing cues to stablize cup with LUE.         Exercises     Shoulder  Instructions       General Comments RN present during begining of session. Communicating with RN about built up handles and good posture during optimzing eating    Pertinent Vitals/ Pain       Pain Assessment: Faces Faces Pain Scale: Hurts little more Pain Location: back Pain Descriptors / Indicators: Grimacing;Guarding Pain Intervention(s): Monitored during session;Repositioned  Home Living Family/patient expects to be discharged to:: Private residence Living Arrangements: Parent   Type of Home: House Home Access: Stairs to enter Secretary/administratorntrance Stairs-Number of Steps: 4   Home Layout: One level     Bathroom Shower/Tub: Tub/shower unit              Additional Comments: Pt able to answer some PLOF and home set-up questions but unsure of accuracy of report as pt disoriented      Prior Functioning/Environment Level of Independence: Independent        Comments: chart states pt lives with mother and son, was in SNF after January admission   Frequency  Min 2X/week        Progress Toward Goals  OT Goals(current goals can now be found in the care plan section)  Progress towards OT goals: Progressing toward goals  Acute Rehab OT Goals Patient Stated Goal: none stated OT Goal Formulation: With patient Time For Goal Achievement: 08/26/17 Potential to Achieve Goals: Good ADL Goals Pt Will Perform Eating: with modified independence;with adaptive utensils;sitting Pt Will Perform Grooming: with min assist;sitting(no more than 2 VC) Pt Will Transfer to Toilet: with min assist;stand pivot transfer Pt Will Perform Toileting - Clothing Manipulation and hygiene: with min assist;sit to/from stand Additional ADL Goal #1: Pt will demonstrate emergent awareness during seated grooming tasks. Additional ADL Goal #2: Pt will sustain attention during 5 minute seated ADL task with no more than 1 VC in a non-distracting environment.  Plan Discharge plan remains appropriate    Co-evaluation                 AM-PAC PT "6 Clicks" Daily Activity     Outcome Measure   Help from another person eating meals?: Total Help from another person taking care of personal grooming?: Total Help from another person toileting, which includes using toliet, bedpan, or urinal?: Total Help from another person bathing (including washing, rinsing, drying)?: Total Help from another person to put on and taking off regular upper body clothing?: Total Help from another person to put on and taking off regular lower body clothing?: Total 6 Click Score: 6    End of Session Equipment Utilized During Treatment: Other (comment)(Built up handles)  OT Visit  Diagnosis: Other abnormalities of gait and mobility (R26.89);Other symptoms and signs involving cognitive function;Pain Pain - part of body: (back)   Activity Tolerance Patient tolerated treatment well   Patient Left in bed;with call bell/phone within reach;with bed alarm set   Nurse Communication Mobility status        Time: [id number] OT Time Calculation (min): 18 min  Charges:    Curlene Dolphinharis Arham Symmonds MSOT, OTR/L Acute Rehab Pager: [phone] Office: [phone]   Theodoro GristCharis M Osten Janek 08/16/2017, 5:08 PM

## 2017-08-16 NOTE — Progress Notes (Signed)
Pharmacy Antibiotic Note  Marcus [name] is a 52 y.o. male admitted on 08/09/2017 with pneumonia and endocarditis. Patient with h/o MSSA MV endocarditis and septic L knee arthitis in 06/2017, s/p 6 weeks IV abx. BCx NGTD, however 3/14 TEE with MV vegetation. Continues on cefazolin, plan 6 weeks per ID (stop date 09/24/17). CT chest possible discitis vs. compression fracture, pending MRI spine for further evaluation. WBC WNL, currently AF.  Plan: Continue cefazolin 2g IV q8h F/u further infectious work up, clinical status, renal function  Height: 5\' 11"  (180.3 cm) Weight: 128 lb 8.5 oz (58.3 kg) IBW/kg (Calculated) : 75.3  Temp (24hrs), Avg:98.6 F (37 C), Min:98 F (36.7 C), Max:99.3 F (37.4 C)  Recent Labs  Lab 08/09/17 1402 08/09/17 1617  08/12/17 0423 08/13/17 0416 08/14/17 0531 08/15/17 1054 08/16/17 0357  WBC  --   --    < > 8.8 7.1 7.3 5.4 9.1  CREATININE  --   --    < > 1.21 1.14 1.07 1.16 1.15  LATICACIDVEN 1.90 1.54  --   --   --   --   --   --    < > = values in this interval not displayed.    Estimated Creatinine Clearance: 62 mL/min (by C-G formula based on SCr of 1.15 mg/dL).    No Known Allergies  Antimicrobials this admission: Vanc 3/9 >> 3/10 Cefepime 3/9 >> 3/11 Cefazolin 3/11 >> 3/13; 3/15 >>  Microbiology: 3/9: BCx x2: negative 3/10: Pleural fluid Cx: negative 3/10 BCx x2: negative  3/10 fungal (pleural): pending  3/15: BCx x2: sent 3/11 MRSA PCR: positive 3/9 Resp PCR: negative  3/9 UCx: mult speci/ recollect/final  Thank you for allowing pharmacy to be a part of this patient's care.  Roderic ScarceErin N. Zigmund Danieleja, PharmD PGY1 Pharmacy Resident Pager: [phone] 08/16/2017 9:43 AM

## 2017-08-16 NOTE — Progress Notes (Addendum)
Family Medicine Teaching Service Daily Progress Note Intern Pager: [phone]  Patient name: Marcus [name] Medical record number: [id number] Date of birth: 09-07-1965 Age: 52 y.o. Gender: male  Primary Care Provider: Patient, No Pcp Per Consultants: CCM, ID, Cardiology  Code Status: Full   Pt Overview and Major Events to Date:  3/09: Admit forsepsis thought to be secondary to HAP and new onsetHF,cultures obtained, broad-spectrum antibiotics initiated 3/10: PCCM consulted,tolerating BiPAP 3/10: thoracentesis 3/14 TEE with severe MR, CVTS consulted  Assessment and Plan: Marcus [name] a 52 y.o.malepresenting with worsening SOB. 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  Endocarditis of mitral valve h/oembolic strokeand metabolic encephalopathy new onset heart failure Recently diagnosed with mitral valve endocarditis in January 2019 with methicillin sensitive staph aureus bacteremia and septic left knee arthritis status post aspiration.Patient does have signs concerning for new onset heart failure 2/2 vavular dysfunction. Echo on 3/10 showing worsening LVEF to 55%, vegetation in anterior mitral leaflet, and severe mitral regurgiation.Per ID appears to be sterile vegetation.TEE showing normal LVF, mild LAE, mitral valve thickening w/ mobilemass on anterior leaflet flail cord. 0.5cm round perforation, severe regurgitation. Orthopantogram showing poor dentition with possible abscess.  -cardiology consulted, appreciate recommendations: lasix 40mg  BID -daily weight -strict Is and Os -CVTS consulted, appreciate recommendations: future left and right heart cath, treat for endocarditis, will follow up on 08/18/17 -continue ancef (3/15-) -will consult hospital dentist, Dr. Kristin [name]: plan for full extraction 11a 3/21 -ID following: keep on ancef pending CVTS sx (end date 09/24/17)  Anemia Current  Hgb9.7 on 3/14. Appears to be chronically low with baseline of 8-9. Normocytic  -continue to monitor  Left knee edema h/o left MSSA septic arthritis Presented with lower extremity edema. Does have recent septic arthritis of the knee.bilateraly lower extremity dopplers negative for DVT.  Hepatitis C genotype 2B Noted during previous hospitalization. No prior treatment. Recent viral load 224K.LFTsstable.  -ID consulted, appreciate recommendations: recommended outpatient tx  Compression deformities of mid to lower thoracic spine Patientreports history of fall back in January 2019he continues to have persistent low back pain since discharge. Findings of uncertain age mid to lower thoracic spine compression deformities on CXR. Patient reports use of OxyContin and oxycodone for pain control though no medications per chart review. -wean fentanyl to 12. q6 PRN -add percocet 5-325 q6H scheduled -MRI spine, needs anesthesia for this, will need to plan during weekday -PT/OT consulted  Polysubstance abuse UDS positive for barbiturates and opiates on presentation. Also has history of cocaine back in January 2019. Has known endocarditis likely from IV drug use though patient reports no recentillicit drug use. Ethanol level within normal limits on presentation. Currently on CIWA and COWS.CIWA overnight to4>12>5. COWS overnight 0>1>9>6 -ContinueCIWAprotocol -Daily folate and thiamine -monitor COWS score -decrease ativan to 0.5mg  QD scheduled  Hyponatremia: question primary polydipsia as patient has been sleeping much of the time.   -encourage PO hydration  FEN/GI:heart healthy diet WUX:LKGM, lovenox  Disposition: transfer to med surg  Subjective:  Patient states he feels calm enough to try MRI again today. Getting bath.   Objective: Temp:  [98 F (36.7 C)-99.3 F (37.4 C)] 98.2 F (36.8 C) (03/16 0803) Pulse Rate:  [84-88] 87 (03/16 0405) Resp:   [16-28] 17 (03/16 0405) BP: (92-114)/(58-76) 114/76 (03/16 0803) SpO2:  [97 %-100 %] 98 % (03/16 0405) Weight:  [128 lb 8.5 oz (58.3 kg)-132 lb 0.9 oz (59.9 kg)] 128 lb 8.5 oz (58.3  kg) (03/16 0405) Physical Exam: General: awake and alert Cardiovascular: RRR, grade 3 murmur  Respiratory: CTAB, no wheezes, rales or rhonchi, no increased work of breathing  Abdomen: soft, non tender, non distended, bowel sounds normal  Extremities: non tender, no edema  Laboratory: Recent Labs  Lab 08/14/17 0531 08/15/17 1054 08/16/17 0357  WBC 7.3 5.4 9.1  HGB 9.7* 10.0* 10.2*  HCT 30.7* 32.1* 32.9*  PLT 323 298 289   Recent Labs  Lab 08/14/17 0531 08/15/17 1054 08/16/17 0357  NA 136 136 132*  K 3.9 4.2 3.9  CL 99* 101 95*  CO2 28 26 26   BUN 16 14 15   CREATININE 1.07 1.16 1.15  CALCIUM 8.4* 8.5* 8.4*  PROT 6.8 6.7 6.8  BILITOT 0.7 0.5 0.4  ALKPHOS 96 88 101  ALT <5* <5* <5*  AST 16 22 20   GLUCOSE 98 124* 95    Imaging/Diagnostic Tests: Dg Orthopantogram  Result Date: 08/15/2017 CLINICAL DATA:  Poor dentition.  Cardiac workup. EXAM: ORTHOPANTOGRAM/PANORAMIC COMPARISON:  None. FINDINGS: There are numerous missing maxillary and mandibular teeth, and multiple remaining maxillary teeth appear broken. There is mild periapical lucency associated with a left maxillary premolar tooth. IMPRESSION: Poor dentition with multiple missing and broken teeth. Mild periapical lucency associated with a left maxillary premolar tooth, abscess not excluded. Electronically Signed   By: Sebastian Ache M.D.   On: 08/15/2017 09:12   Dg Chest 2 View  Result Date: 08/14/2017 CLINICAL DATA:  Followup pneumonia. EXAM: CHEST - 2 VIEW COMPARISON:  08/10/2016 and older studies. FINDINGS: Moderate right and small left pleural effusions. There is associated lung base opacity, greater on the right, which is consistent with atelectasis, pneumonia or a combination. The patchy airspace opacity noted along the perihilar left  lung on the prior study has improved. Airspace opacity in the right mid to lower lung appears somewhat less confluent but is unchanged in extent. No new lung abnormalities.  No pneumothorax. IMPRESSION: 1. Mild improvement in lung consolidation compared to the prior exam. 2. Persistent moderate right and small left pleural effusions. No new abnormalities. Electronically Signed   By: Amie Portland M.D.   On: 08/14/2017 13:16   Dg Chest 2 View  Addendum Date: 08/09/2017   ADDENDUM REPORT: 08/09/2017 15:41 ADDENDUM: After further review, there are compression deformities of 2 adjacent vertebral bodies in the mid to lower thoracic spine, of uncertain age but new compared to an older chest x-ray 02/15/2005. Would consider further characterization with CT. These results were called by telephone at the time of interpretation on 08/09/2017 at 3:40 pm to Dr. Doug Sou , who verbally acknowledged these results. Electronically Signed   By: Bary Richard M.D.   On: 08/09/2017 15:41   Result Date: 08/09/2017 CLINICAL DATA:  Shortness of breath for 2 days. Bacterial meningitis diagnosed at the beginning of January, than recently diagnosed with pneumonia. History of TIA in January with left-sided weakness. Former smoker. EXAM: CHEST - 2 VIEW COMPARISON:  Chest x-ray dated 06/08/2017. FINDINGS: New dense opacity at the right lung base, likely a combination of consolidation and pleural effusion. Pleural effusion component is at least moderate in size. Patchy opacities are seen within the left perihilar and lower lung zones, favor edema. Additional opacity at the left lung base is likely a combination of atelectasis and small pleural effusion. Heart size and mediastinal contours are stable. Osseous structures about the chest are unremarkable. IMPRESSION: 1. New dense opacity at the right lung base, likely a combination of  pneumonia or atelectasis and pleural effusion. The pleural effusion component is at least moderate in  size. Would consider chest CT for further characterization. 2. Additional patchy ill-defined opacities within the left mid and lower lung, most likely edema, pneumonia considered less likely. 3. Probable mild atelectasis and/or small pleural effusion at the left lung base. Electronically Signed: By: Bary Richard M.D. On: 08/09/2017 15:29   Ct Angio Chest Pe W And/or Wo Contrast  Result Date: 08/09/2017 CLINICAL DATA:  SOB x2 days. Pt diagnosed with bacterial meningitis at the beginning of January, then recently diagnosed with pneumonia. Pt also has hx of TIA beginning of January that left him with left sided weakness. Patient had left knee surgery x 6 weeks ago for sepsis. EXAM: CT ANGIOGRAPHY CHEST WITH CONTRAST TECHNIQUE: Multidetector CT imaging of the chest was performed using the standard protocol during bolus administration of intravenous contrast. Multiplanar CT image reconstructions and MIPs were obtained to evaluate the vascular anatomy. CONTRAST:  ISOVUE-370 IOPAMIDOL (ISOVUE-370) INJECTION 76% COMPARISON:  Current chest radiographs and prior studies. FINDINGS: Cardiovascular: Satisfactory opacification of the pulmonary arteries to the segmental level. No evidence of pulmonary embolism. Heart is normal in size and configuration. No pericardial effusion. No coronary artery calcifications. The great vessels normal in caliber. No aortic atherosclerosis. No dissection. Mediastinum/Nodes: No neck base or axillary masses or pathologically enlarged lymph nodes. No mediastinal or hilar masses or discrete enlarged lymph nodes. The trachea is patent and normal in caliber. Esophagus is unremarkable. Lungs/Pleura: Large right and small left pleural effusions. Complete atelectasis of the right middle lobe and near complete atelectasis of the right lower lobe. Small area of consolidation in the anterior inferior right upper lobe. Patchy consolidation is noted in a peribronchovascular distribution in the left  upper lobe and, to lesser degree, left lower lobe. There is dependent opacity also in the left lower lobe consistent with atelectasis. No pneumothorax. Upper Abdomen: No acute abnormality. Musculoskeletal: There is marked narrowing of the T6-T7 disc space with resorption/irregularity of the lower endplate of T6 in upper endplate of T7, with mild loss of height of these vertebra. This leads to a mild focal kyphosis. This is new since a chest radiograph dated 02/15/2005. A subtle lucency across the T6 spinous process posteriorly suggests a previous fracture. All remaining vertebral bodies are normal in height. No osteoblastic or osteolytic lesions. Review of the MIP images confirms the above findings. IMPRESSION: 1. Patchy areas of consolidation in both lungs consistent with multifocal pneumonia. 2. Complete atelectasis of the right middle lobe and near complete atelectasis of the right lower lobe. Mild dependent atelectasis in the left lower lobe. 3. Large right and small left pleural effusions. 4. Marked loss of the disc space at T6-C7 with irregular resorption of endplates leading to loss of vertebral body height of T6 and T7 and a focal kyphosis. This may reflect the sequelae of an old fracture. It could be due to previous discitis/osteomyelitis. Electronically Signed   By: Amie Portland M.D.   On: 08/09/2017 18:44   Mr Brain Wo Contrast  Result Date: 08/14/2017 CLINICAL DATA:  52 y/o  M; stroke follow-up. EXAM: MRI HEAD WITHOUT CONTRAST TECHNIQUE: Axial DWI sequence was acquired. Patient combative, unable to continue examination. COMPARISON:  06/09/2017 MRI head.  06/08/2017 CT head. FINDINGS: Low B value DWI sequence demonstrates areas of T2 hyperintensity in the right greater than left frontal, parietal, and occipital lobes as well as splenium of corpus callosum and cerebellum corresponding to infarctions on  prior MRI. The high B value sequence is motion degraded, insufficient to assess for reduced  diffusion and acute stroke. IMPRESSION: Severe motion degraded DWI sequence insufficient to assess for acute stroke. Repeat imaging is recommended when patient is able to hold still and follow commands. Electronically Signed   By: Mitzi Hansen M.D.   On: 08/14/2017 21:38   Dg Chest Port 1 View  Result Date: 08/10/2017 CLINICAL DATA:  Post right thora, 1.4 L removed EXAM: PORTABLE CHEST 1 VIEW COMPARISON:  Chest x-ray dated 08/09/2017. FINDINGS: Some improvement in aeration at the left lung base. Residual opacity is presumably atelectasis or pneumonia. Ill-defined opacities persist throughout the left lung, consistent with the multifocal pneumonia better demonstrated on chest CT of 08/09/2017. Additional persistent atelectasis at the left lung base. No pneumothorax. Heart size and mediastinal contours appear stable. IMPRESSION: 1. Improved aeration at the right lung base status post thoracentesis. Residual opacity is presumably atelectasis or pneumonia. No pneumothorax seen. 2. Stable patchy opacities throughout the left lung, compatible with the multifocal pneumonia demonstrated on yesterday's chest CT. Electronically Signed   By: Bary Richard M.D.   On: 08/10/2017 14:54   Ct Angio Chest/abd/pel For Dissection W And/or W/wo  Result Date: 08/14/2017 CLINICAL DATA:  Septic arterial embolism EXAM: CT ANGIOGRAPHY CHEST, ABDOMEN AND PELVIS TECHNIQUE: Multidetector CT imaging through the chest, abdomen and pelvis was performed using the standard protocol during bolus administration of intravenous contrast. Multiplanar reconstructed images and MIPs were obtained and reviewed to evaluate the vascular anatomy. CONTRAST:  100 cc ISOVUE-370 IOPAMIDOL (ISOVUE-370) INJECTION 76% COMPARISON:  Chest CT 08/09/2017 FINDINGS: CTA CHEST FINDINGS Cardiovascular: No acute pulmonary embolus. Satisfactory opacification of the aorta and pulmonary arteries. Normal size heart without pericardial effusion. Normal branch  pattern of the great vessels off the arch. No coronary arteriosclerosis. No aortic atherosclerosis. No aneurysm. No aortic dissection. Mediastinum/Nodes: No supraclavicular, axillary, mediastinal nor hilar lymphadenopathy. Trachea is patent and normal caliber without intraluminal abnormality. Esophagus is unremarkable. No thyromegaly is noted. Lungs/Pleura: Bilateral moderate-sized pleural effusions slightly smaller on the right with loculated component. Adjacent compressive atelectasis is noted. Patchy airspace opacities in the left upper lobe are redemonstrated, with slight improvement in aeration since prior though the vast majority of the pulmonary opacity still persists. Musculoskeletal: Redemonstration of marked disc space narrowing C6-7 with endplate irregularity of the inferior endplate of T6 superior endplate of T7 with resultant mild focal kyphosis. Review of the MIP images confirms the above findings. CTA ABDOMEN AND PELVIS FINDINGS VASCULAR Aorta: Normal caliber aorta without aneurysm, dissection, vasculitis or significant stenosis. Celiac: Patent without evidence of aneurysm, dissection, vasculitis or significant stenosis. SMA: Patent without evidence of aneurysm, dissection, vasculitis or significant stenosis. Renals: Single renal arteries bilaterally without acute abnormality. No significant stenosis, dissection or evidence of fibromuscular dysplasia. IMA: Patent Inflow: No aneurysm or dissection. Minimal atherosclerotic plaque in the proximal right common iliac and both distal iliac arteries. No significant stenosis. No dissection or aneurysm. Veins: No obvious venous abnormality within the limitations of this arterial phase study. Review of the MIP images confirms the above findings. NON-VASCULAR Hepatobiliary: No focal liver abnormality is seen. No gallstones, gallbladder wall thickening, or biliary dilatation. Pancreas: Unremarkable. No pancreatic ductal dilatation or surrounding inflammatory  changes. Spleen: Heterogeneous enhancement likely due to timing of contrast bolus. No splenomegaly. Adrenals/Urinary Tract: No adrenal no renal masses. No nephrolithiasis nor obstructive uropathy. No hydroureteronephrosis. Unremarkable bladder. Stomach/Bowel: Nondistended stomach with normal small bowel rotation. Mild fluid-filled distention of small bowel loops  with slight mural thickening of jejunum may reflect a mild small bowel enteritis. No mechanical source obstruction is seen. Colon is. Normal appearing appendix. Lymphatic: No abdominopelvic adenopathy. Reproductive: Normal size prostate and seminal vesicles. Other: Small amount of fluid in the left inguinal canal. Musculoskeletal: Plate and screw fixation of the left acetabulum. 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 No acute osseous abnormality. There is lower lumbar degenerative facet arthropathy. Minimal anterolisthesis of L4 on L5. Marked disc space narrowing at L5-S1. Review of the MIP images confirms the above findings. IMPRESSION: Chest CT: 1. No acute pulmonary embolus. 2. No aortic aneurysm or dissection. 3. Bilateral moderate-sized pleural effusions slightly smaller on the right with loculated component redemonstrated. Adjacent compressive atelectasis is seen both lower lobes. 4. Relatively unchanged patchy airspace opacity in the left upper lobe only minimally improved in aeration. 5. Redemonstration of focal kyphosis at T6-7 secondary to endplate irregularities and disc space narrowing suspicious for changes of discitis/osteomyelitis. MRI would be the study of choice for further correlation as to acuity. Abdomen and pelvic CT: 1. No aortic aneurysm or dissection. Patent branch vessels without significant stenosis or focal abnormality. 2. Mild fluid-filled distention of small bowel which transmural thickening suspicious for changes of mild enteritis. No mechanical bowel obstruction. Electronically Signed   By: Tollie Ethavid  Kwon M.D.   On:  08/14/2017 21:21   [place] Thoracentesis Asp Pleural Space W/img Guide  Result Date: 08/10/2017 INDICATION: Respiratory failure. Large right pleural effusion. Request for diagnostic and therapeutic thoracentesis. EXAM: ULTRASOUND GUIDED RIGHT THORACENTESIS MEDICATIONS: None. COMPLICATIONS: None immediate. PROCEDURE: An ultrasound guided thoracentesis was thoroughly discussed with the patient and questions answered. The benefits, risks, alternatives and complications were also discussed. The patient understands and wishes to proceed with the procedure. Written consent was obtained. Ultrasound was performed to localize and mark an adequate pocket of fluid in the right chest. The area was then prepped and draped in the normal sterile fashion. 1% Lidocaine was used for local anesthesia. Under ultrasound guidance a Safe-T-Centesis catheter was introduced. Thoracentesis was performed. The catheter was removed and a dressing applied. FINDINGS: A total of approximately 1.4 L of pink/blood-tinged fluid was removed. Samples were sent to the laboratory as requested by the clinical team. IMPRESSION: Successful ultrasound guided right thoracentesis yielding 1.4 L of pleural fluid. Read by: Brayton ElKevin Bruning PA-C Electronically Signed   By: Corlis Leak  Hassell M.D.   On: 08/10/2017 12:55    Garth Bignessimberlake, Millard Bautch, MD 08/16/2017, 10:25 AM PGY-2, [facility] FPTS Intern pager: [phone], text pages welcome

## 2017-08-17 LAB — BASIC METABOLIC PANEL
Anion gap: 10 (ref 5–15)
BUN: 21 mg/dL — AB (ref 6–20)
CHLORIDE: 94 mmol/L — AB (ref 101–111)
CO2: 27 mmol/L (ref 22–32)
CREATININE: 1.2 mg/dL (ref 0.61–1.24)
Calcium: 8.3 mg/dL — ABNORMAL LOW (ref 8.9–10.3)
GFR calc Af Amer: >60 mL/min (ref >60)
GFR calc non Af Amer: >60 mL/min (ref >60)
GLUCOSE: 104 mg/dL — AB (ref 65–99)
Potassium: 3.9 mmol/L (ref 3.5–5.1)
SODIUM: 131 mmol/L — AB (ref 135–145)

## 2017-08-17 LAB — CBC
HCT: 32.5 % — ABNORMAL LOW (ref 39.0–52.0)
HEMOGLOBIN: 10.3 g/dL — AB (ref 13.0–17.0)
MCH: 28.1 pg (ref 26.0–34.0)
MCHC: 31.7 g/dL (ref 30.0–36.0)
MCV: 88.8 fL (ref 78.0–100.0)
Platelets: 305 10*3/uL (ref 150–400)
RBC: 3.66 MIL/uL — ABNORMAL LOW (ref 4.22–5.81)
RDW: 14.4 % (ref 11.5–15.5)
WBC: 13.4 10*3/uL — ABNORMAL HIGH (ref 4.0–10.5)

## 2017-08-17 MED ORDER — ONDANSETRON HCL 4 MG/2ML IJ SOLN
4.0000 mg | Freq: Once | INTRAMUSCULAR | Status: AC
  Administered 2017-08-17: 4 mg via INTRAVENOUS
  Filled 2017-08-17: qty 2

## 2017-08-17 NOTE — Progress Notes (Signed)
Subjective:  Complains of significant back pain and asking for more narcotics.  No complaints of shortness of breath or chest pain today.  Weight down some with diuresis.creatinine slightly up.he is much more lucid with a good understanding and asking appropriate questions today.  Objective:  Vital Signs in the last 24 hours: BP 95/65 (BP Location: Right Arm)   Pulse 92   Temp 97.9 F (36.6 C) (Oral)   Resp 17   Ht 5\' 11"  (1.803 m)   Wt 56.8 kg (125 lb 4.8 oz)   SpO2 98%   BMI 17.48 kg/m   Physical Exam: Somewhat disheveled male lying on side in bed in no acute distress Lungs:  Clear Cardiac:  Regular rhythm, normal S1 and S2, no S3, 2/6 systolic murmur of mitral regurgitation Extremities:  No edema present  Intake/Output from previous day: 03/16 0701 - 03/17 0700 In: 1490 [P.O.:1190; IV Piggyback:300] Out: 1150 [Urine:1150]  Weight Filed Weights   08/15/17 1200 08/16/17 0405 08/17/17 0424  Weight: 59.9 kg (132 lb 0.9 oz) 58.3 kg (128 lb 8.5 oz) 56.8 kg (125 lb 4.8 oz)    Lab Results: Basic Metabolic Panel: Recent Labs    08/16/17 0357 08/17/17 0504  NA 132* 131*  K 3.9 3.9  CL 95* 94*  CO2 26 27  GLUCOSE 95 104*  BUN 15 21*  CREATININE 1.15 1.20   CBC: Recent Labs    08/16/17 0357 08/17/17 0504  WBC 9.1 13.4*  HGB 10.2* 10.3*  HCT 32.9* 32.5*  MCV 90.6 88.8  PLT 289 305   Telemetry: Currently sinus rhythm  Assessment/Plan:  1.  Severe mitral regurgitation due to endocarditis and leaflet perforation 2.  Acute diastolic heart failure started back on diuretics with volume status improved  3.  Pleural effusion and infiltrates 4.  Malnutrition  Recommendations:  Awaiting  results of tuneup prior to potential mitral valve replacement.  He certainly looks a lot better today.Darden Palmer.    W. Spencer Vonette Grosso, Jr.  MD [facility] Cardiology  08/17/2017, 10:47 AM

## 2017-08-17 NOTE — Progress Notes (Addendum)
Family Medicine Teaching Service Daily Progress Note Intern Pager: [phone]  Patient name: Marcus [name] Medical record number: [id number] Date of birth: 1966/03/07 Age: 52 y.o. Gender: male  Primary Care Provider: Patient, No Pcp Per Consultants: CCM, ID, Cardiology  Code Status: Full   Pt Overview and Major Events to Date:  3/09: Admit forsepsis thought to be secondary to HAP and new onsetHF,cultures obtained, broad-spectrum antibiotics initiated 3/10: PCCM consulted,tolerating BiPAP 3/10: thoracentesis 3/14 TEE with severe MR, CVTS consulted  3/21 plan for full dental extraction   Assessment and Plan: Marcus [name] a 52 y.o.malepresenting with worsening SOB. 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  Endocarditis of mitral valve h/oembolic strokeand metabolic encephalopathy new onset heart failure Recently diagnosed with mitral valve endocarditis in January 2019 with methicillin sensitive staph aureus bacteremia and septic left knee arthritis status post aspiration.Patient does have signs concerning for new onset heart failure 2/2 vavular dysfunction. Echo on 3/10 showing worsening LVEF to 55%, vegetation in anterior mitral leaflet, and severe mitral regurgiation.Per ID appears to be sterile vegetation.TEE showing normal LVF, mild LAE, mitral valve thickening w/ mobilemass on anterior leaflet flail cord. 0.5cm round perforation, severe regurgitation. Orthopantogram showing poor dentition with possible abscess.  Dentistry consulted, d/w with Dr. Kristin Bruins plan for full extraction 11A 3/21.  -cardiology consulted, appreciate recommendations: lasix 40mg  BID, await results of tuneup prior to potential MV replacement  -daily weight -strict Is and Os -CVTS consulted, appreciate recommendations: future left and right heart cath, treat for endocarditis, will follow up on 08/18/17  -continue  ancef (3/15-) -ID following: keep on ancef pending CVTS sx (end date 09/24/17)  Anemia Current Hgb9.7 on 3/14. Stable at 10.3 this AM. Appears to be chronically low with baseline of 8-9. Normocytic  -continue to monitor with daily CBC  Left knee edema h/o left MSSA septic arthritis Presented with lower extremity edema. Improved on exam this morning. Does have recent septic arthritis of the knee.bilateraly lower extremity dopplers negative for DVT. -continue to monitor   Hepatitis C genotype 2B Noted during previous hospitalization. No prior treatment. Recent viral load 224K.LFTsstable.  -ID consulted, appreciate recommendations: recommended outpatient tx   Compression deformities of mid to lower thoracic spine Patientreports history of fall back in January 2019he continues to have persistent low back pain since discharge. Findings of uncertain age mid to lower thoracic spine compression deformities on CXR. Patient reports use of OxyContin and oxycodone for pain control though no medications per chart review. PT recommending SNF.  -wean fentanyl to 12. q6 PRN -add percocet 5-325 q6H scheduled -MRI spine - will attempt today vs tomorrow as needs anesthesia for this  Polysubstance abuse UDS positive for barbiturates and opiates on presentation. Also has history of cocaine back in January 2019. Has known endocarditis likely from IV drug use though patient reports no recentillicit drug use. Ethanol level within normal limits on presentation. Currently on CIWA and COWS.CIWA overnight to4>5>4. COWS overnight 5>8>6>6 -Continue onCIWA  -Daily folate and thiamine -monitor COWS score -decrease ativan to 0.5mg  QD scheduled; plan to discontinue today   Hyponatremia: question primary polydipsia as patient has been sleeping much of the time.   -encourage PO hydration  FEN/GI:heart healthy diet LKG:MWNU, lovenox  Disposition: pending clinical  improvement  Subjective:  Patient tired on exam, discussed with RN in room who states he recently received 1 mg Ativan in addition to percocet for pain.  He states pain is somewhat  controlled.  No other concerns at this time.    Objective: Temp:  [97.4 F (36.3 C)-97.9 F (36.6 C)] 97.9 F (36.6 C) (03/17 0424) Pulse Rate:  [92-93] 92 (03/17 0800) Resp:  [13-20] 17 (03/17 0424) BP: (95-108)/(65-77) 95/65 (03/17 0424) SpO2:  [91 %-98 %] 98 % (03/17 0424) Weight:  [125 lb 4.8 oz (56.8 kg)] 125 lb 4.8 oz (56.8 kg) (03/17 0424) Physical Exam: General: 52 yo male, asleep but able to awaken for exam, resting comfortably  Cardiovascular: RRR, grade 3 murmur  Respiratory: CTAB, no wheeze, normal effort  Abdomen: soft, NTND, +bs  Extremities: non tender, no edema  Laboratory: Recent Labs  Lab 08/15/17 1054 08/16/17 0357 08/17/17 0504  WBC 5.4 9.1 13.4*  HGB 10.0* 10.2* 10.3*  HCT 32.1* 32.9* 32.5*  PLT 298 289 305   Recent Labs  Lab 08/14/17 0531 08/15/17 1054 08/16/17 0357 08/17/17 0504  NA 136 136 132* 131*  K 3.9 4.2 3.9 3.9  CL 99* 101 95* 94*  CO2 28 26 26 27   BUN 16 14 15  21*  CREATININE 1.07 1.16 1.15 1.20  CALCIUM 8.4* 8.5* 8.4* 8.3*  PROT 6.8 6.7 6.8  --   BILITOT 0.7 0.5 0.4  --   ALKPHOS 96 88 101  --   ALT <5* <5* <5*  --   AST 16 22 20   --   GLUCOSE 98 124* 95 104*    Imaging/Diagnostic Tests: Dg Orthopantogram  Result Date: 08/15/2017 CLINICAL DATA:  Poor dentition.  Cardiac workup. EXAM: ORTHOPANTOGRAM/PANORAMIC COMPARISON:  None. FINDINGS: There are numerous missing maxillary and mandibular teeth, and multiple remaining maxillary teeth appear broken. There is mild periapical lucency associated with a left maxillary premolar tooth. IMPRESSION: Poor dentition with multiple missing and broken teeth. Mild periapical lucency associated with a left maxillary premolar tooth, abscess not excluded. Electronically Signed   By: Sebastian Ache M.D.   On: 08/15/2017  09:12   Dg Chest 2 View  Result Date: 08/14/2017 CLINICAL DATA:  Followup pneumonia. EXAM: CHEST - 2 VIEW COMPARISON:  08/10/2016 and older studies. FINDINGS: Moderate right and small left pleural effusions. There is associated lung base opacity, greater on the right, which is consistent with atelectasis, pneumonia or a combination. The patchy airspace opacity noted along the perihilar left lung on the prior study has improved. Airspace opacity in the right mid to lower lung appears somewhat less confluent but is unchanged in extent. No new lung abnormalities.  No pneumothorax. IMPRESSION: 1. Mild improvement in lung consolidation compared to the prior exam. 2. Persistent moderate right and small left pleural effusions. No new abnormalities. Electronically Signed   By: Amie Portland M.D.   On: 08/14/2017 13:16   Dg Chest 2 View  Addendum Date: 08/09/2017   ADDENDUM REPORT: 08/09/2017 15:41 ADDENDUM: After further review, there are compression deformities of 2 adjacent vertebral bodies in the mid to lower thoracic spine, of uncertain age but new compared to an older chest x-ray 02/15/2005. Would consider further characterization with CT. These results were called by telephone at the time of interpretation on 08/09/2017 at 3:40 pm to Dr. Doug Sou , who verbally acknowledged these results. Electronically Signed   By: Bary Richard M.D.   On: 08/09/2017 15:41   Result Date: 08/09/2017 CLINICAL DATA:  Shortness of breath for 2 days. Bacterial meningitis diagnosed at the beginning of January, than recently diagnosed with pneumonia. History of TIA in January with left-sided weakness. Former smoker. EXAM: CHEST - 2 VIEW  COMPARISON:  Chest x-ray dated 06/08/2017. FINDINGS: New dense opacity at the right lung base, likely a combination of consolidation and pleural effusion. Pleural effusion component is at least moderate in size. Patchy opacities are seen within the left perihilar and lower lung zones, favor  edema. Additional opacity at the left lung base is likely a combination of atelectasis and small pleural effusion. Heart size and mediastinal contours are stable. Osseous structures about the chest are unremarkable. IMPRESSION: 1. New dense opacity at the right lung base, likely a combination of pneumonia or atelectasis and pleural effusion. The pleural effusion component is at least moderate in size. Would consider chest CT for further characterization. 2. Additional patchy ill-defined opacities within the left mid and lower lung, most likely edema, pneumonia considered less likely. 3. Probable mild atelectasis and/or small pleural effusion at the left lung base. Electronically Signed: By: Bary Richard M.D. On: 08/09/2017 15:29   Ct Angio Chest Pe W And/or Wo Contrast  Result Date: 08/09/2017 CLINICAL DATA:  SOB x2 days. Pt diagnosed with bacterial meningitis at the beginning of January, then recently diagnosed with pneumonia. Pt also has hx of TIA beginning of January that left him with left sided weakness. Patient had left knee surgery x 6 weeks ago for sepsis. EXAM: CT ANGIOGRAPHY CHEST WITH CONTRAST TECHNIQUE: Multidetector CT imaging of the chest was performed using the standard protocol during bolus administration of intravenous contrast. Multiplanar CT image reconstructions and MIPs were obtained to evaluate the vascular anatomy. CONTRAST:  ISOVUE-370 IOPAMIDOL (ISOVUE-370) INJECTION 76% COMPARISON:  Current chest radiographs and prior studies. FINDINGS: Cardiovascular: Satisfactory opacification of the pulmonary arteries to the segmental level. No evidence of pulmonary embolism. Heart is normal in size and configuration. No pericardial effusion. No coronary artery calcifications. The great vessels normal in caliber. No aortic atherosclerosis. No dissection. Mediastinum/Nodes: No neck base or axillary masses or pathologically enlarged lymph nodes. No mediastinal or hilar masses or discrete enlarged  lymph nodes. The trachea is patent and normal in caliber. Esophagus is unremarkable. Lungs/Pleura: Large right and small left pleural effusions. Complete atelectasis of the right middle lobe and near complete atelectasis of the right lower lobe. Small area of consolidation in the anterior inferior right upper lobe. Patchy consolidation is noted in a peribronchovascular distribution in the left upper lobe and, to lesser degree, left lower lobe. There is dependent opacity also in the left lower lobe consistent with atelectasis. No pneumothorax. Upper Abdomen: No acute abnormality. Musculoskeletal: There is marked narrowing of the T6-T7 disc space with resorption/irregularity of the lower endplate of T6 in upper endplate of T7, with mild loss of height of these vertebra. This leads to a mild focal kyphosis. This is new since a chest radiograph dated 02/15/2005. A subtle lucency across the T6 spinous process posteriorly suggests a previous fracture. All remaining vertebral bodies are normal in height. No osteoblastic or osteolytic lesions. Review of the MIP images confirms the above findings. IMPRESSION: 1. Patchy areas of consolidation in both lungs consistent with multifocal pneumonia. 2. Complete atelectasis of the right middle lobe and near complete atelectasis of the right lower lobe. Mild dependent atelectasis in the left lower lobe. 3. Large right and small left pleural effusions. 4. Marked loss of the disc space at T6-C7 with irregular resorption of endplates leading to loss of vertebral body height of T6 and T7 and a focal kyphosis. This may reflect the sequelae of an old fracture. It could be due to previous discitis/osteomyelitis. Electronically Signed  By: Amie Portlandavid  Ormond M.D.   On: 08/09/2017 18:44   Mr Brain Wo Contrast  Result Date: 08/14/2017 CLINICAL DATA:  52 y/o  M; stroke follow-up. EXAM: MRI HEAD WITHOUT CONTRAST TECHNIQUE: Axial DWI sequence was acquired. Patient combative, unable to continue  examination. COMPARISON:  06/09/2017 MRI head.  06/08/2017 CT head. FINDINGS: Low B value DWI sequence demonstrates areas of T2 hyperintensity in the right greater than left frontal, parietal, and occipital lobes as well as splenium of corpus callosum and cerebellum corresponding to infarctions on prior MRI. The high B value sequence is motion degraded, insufficient to assess for reduced diffusion and acute stroke. IMPRESSION: Severe motion degraded DWI sequence insufficient to assess for acute stroke. Repeat imaging is recommended when patient is able to hold still and follow commands. Electronically Signed   By: Mitzi HansenLance  Furusawa-Stratton M.D.   On: 08/14/2017 21:38   Dg Chest Port 1 View  Result Date: 08/10/2017 CLINICAL DATA:  Post right thora, 1.4 L removed EXAM: PORTABLE CHEST 1 VIEW COMPARISON:  Chest x-ray dated 08/09/2017. FINDINGS: Some improvement in aeration at the left lung base. Residual opacity is presumably atelectasis or pneumonia. Ill-defined opacities persist throughout the left lung, consistent with the multifocal pneumonia better demonstrated on chest CT of 08/09/2017. Additional persistent atelectasis at the left lung base. No pneumothorax. Heart size and mediastinal contours appear stable. IMPRESSION: 1. Improved aeration at the right lung base status post thoracentesis. Residual opacity is presumably atelectasis or pneumonia. No pneumothorax seen. 2. Stable patchy opacities throughout the left lung, compatible with the multifocal pneumonia demonstrated on yesterday's chest CT. Electronically Signed   By: Bary RichardStan  Maynard M.D.   On: 08/10/2017 14:54   Ct Angio Chest/abd/pel For Dissection W And/or W/wo  Result Date: 08/14/2017 CLINICAL DATA:  Septic arterial embolism EXAM: CT ANGIOGRAPHY CHEST, ABDOMEN AND PELVIS TECHNIQUE: Multidetector CT imaging through the chest, abdomen and pelvis was performed using the standard protocol during bolus administration of intravenous contrast. Multiplanar  reconstructed images and MIPs were obtained and reviewed to evaluate the vascular anatomy. CONTRAST:  100 cc ISOVUE-370 IOPAMIDOL (ISOVUE-370) INJECTION 76% COMPARISON:  Chest CT 08/09/2017 FINDINGS: CTA CHEST FINDINGS Cardiovascular: No acute pulmonary embolus. Satisfactory opacification of the aorta and pulmonary arteries. Normal size heart without pericardial effusion. Normal branch pattern of the great vessels off the arch. No coronary arteriosclerosis. No aortic atherosclerosis. No aneurysm. No aortic dissection. Mediastinum/Nodes: No supraclavicular, axillary, mediastinal nor hilar lymphadenopathy. Trachea is patent and normal caliber without intraluminal abnormality. Esophagus is unremarkable. No thyromegaly is noted. Lungs/Pleura: Bilateral moderate-sized pleural effusions slightly smaller on the right with loculated component. Adjacent compressive atelectasis is noted. Patchy airspace opacities in the left upper lobe are redemonstrated, with slight improvement in aeration since prior though the vast majority of the pulmonary opacity still persists. Musculoskeletal: Redemonstration of marked disc space narrowing C6-7 with endplate irregularity of the inferior endplate of T6 superior endplate of T7 with resultant mild focal kyphosis. Review of the MIP images confirms the above findings. CTA ABDOMEN AND PELVIS FINDINGS VASCULAR Aorta: Normal caliber aorta without aneurysm, dissection, vasculitis or significant stenosis. Celiac: Patent without evidence of aneurysm, dissection, vasculitis or significant stenosis. SMA: Patent without evidence of aneurysm, dissection, vasculitis or significant stenosis. Renals: Single renal arteries bilaterally without acute abnormality. No significant stenosis, dissection or evidence of fibromuscular dysplasia. IMA: Patent Inflow: No aneurysm or dissection. Minimal atherosclerotic plaque in the proximal right common iliac and both distal iliac arteries. No significant stenosis.  No dissection or aneurysm. Veins:  No obvious venous abnormality within the limitations of this arterial phase study. Review of the MIP images confirms the above findings. NON-VASCULAR Hepatobiliary: No focal liver abnormality is seen. No gallstones, gallbladder wall thickening, or biliary dilatation. Pancreas: Unremarkable. No pancreatic ductal dilatation or surrounding inflammatory changes. Spleen: Heterogeneous enhancement likely due to timing of contrast bolus. No splenomegaly. Adrenals/Urinary Tract: No adrenal no renal masses. No nephrolithiasis nor obstructive uropathy. No hydroureteronephrosis. Unremarkable bladder. Stomach/Bowel: Nondistended stomach with normal small bowel rotation. Mild fluid-filled distention of small bowel loops with slight mural thickening of jejunum may reflect a mild small bowel enteritis. No mechanical source obstruction is seen. Colon is. Normal appearing appendix. Lymphatic: No abdominopelvic adenopathy. Reproductive: Normal size prostate and seminal vesicles. Other: Small amount of fluid in the left inguinal canal. Musculoskeletal: Plate and screw fixation of the left acetabulum. 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 No acute osseous abnormality. There is lower lumbar degenerative facet arthropathy. Minimal anterolisthesis of L4 on L5. Marked disc space narrowing at L5-S1. Review of the MIP images confirms the above findings. IMPRESSION: Chest CT: 1. No acute pulmonary embolus. 2. No aortic aneurysm or dissection. 3. Bilateral moderate-sized pleural effusions slightly smaller on the right with loculated component redemonstrated. Adjacent compressive atelectasis is seen both lower lobes. 4. Relatively unchanged patchy airspace opacity in the left upper lobe only minimally improved in aeration. 5. Redemonstration of focal kyphosis at T6-7 secondary to endplate irregularities and disc space narrowing suspicious for changes of discitis/osteomyelitis. MRI would be the study  of choice for further correlation as to acuity. Abdomen and pelvic CT: 1. No aortic aneurysm or dissection. Patent branch vessels without significant stenosis or focal abnormality. 2. Mild fluid-filled distention of small bowel which transmural thickening suspicious for changes of mild enteritis. No mechanical bowel obstruction. Electronically Signed   By: Tollie Eth M.D.   On: 08/14/2017 21:21   US Thoracentesis Asp Pleural Space W/img Guide  Result Date: 08/10/2017 INDICATION: Respiratory failure. Large right pleural effusion. Request for diagnostic and therapeutic thoracentesis. EXAM: ULTRASOUND GUIDED RIGHT THORACENTESIS MEDICATIONS: None. COMPLICATIONS: None immediate. PROCEDURE: An ultrasound guided thoracentesis was thoroughly discussed with the patient and questions answered. The benefits, risks, alternatives and complications were also discussed. The patient understands and wishes to proceed with the procedure. Written consent was obtained. Ultrasound was performed to localize and mark an adequate pocket of fluid in the right chest. The area was then prepped and draped in the normal sterile fashion. 1% Lidocaine was used for local anesthesia. Under ultrasound guidance a Safe-T-Centesis catheter was introduced. Thoracentesis was performed. The catheter was removed and a dressing applied. FINDINGS: A total of approximately 1.4 L of pink/blood-tinged fluid was removed. Samples were sent to the laboratory as requested by the clinical team. IMPRESSION: Successful ultrasound guided right thoracentesis yielding 1.4 L of pleural fluid. Read by: Brayton El PA-C Electronically Signed   By: Corlis Leak M.D.   On: 08/10/2017 12:55    Freddrick March, MD 08/17/2017, 10:14 AM PGY-2, [facility] FPTS Intern pager: [phone], text pages welcome

## 2017-08-18 ENCOUNTER — Inpatient Hospital Stay (HOSPITAL_COMMUNITY): Payer: Medicaid Other

## 2017-08-18 DIAGNOSIS — Z8739 Personal history of other diseases of the musculoskeletal system and connective tissue: Secondary | ICD-10-CM

## 2017-08-18 DIAGNOSIS — Z8619 Personal history of other infectious and parasitic diseases: Secondary | ICD-10-CM

## 2017-08-18 DIAGNOSIS — Z8679 Personal history of other diseases of the circulatory system: Secondary | ICD-10-CM

## 2017-08-18 DIAGNOSIS — R634 Abnormal weight loss: Secondary | ICD-10-CM

## 2017-08-18 DIAGNOSIS — I339 Acute and subacute endocarditis, unspecified: Secondary | ICD-10-CM

## 2017-08-18 DIAGNOSIS — M40204 Unspecified kyphosis, thoracic region: Secondary | ICD-10-CM

## 2017-08-18 DIAGNOSIS — I340 Nonrheumatic mitral (valve) insufficiency: Secondary | ICD-10-CM

## 2017-08-18 DIAGNOSIS — R0602 Shortness of breath: Secondary | ICD-10-CM

## 2017-08-18 DIAGNOSIS — M4644 Discitis, unspecified, thoracic region: Secondary | ICD-10-CM | POA: Diagnosis present

## 2017-08-18 DIAGNOSIS — R5381 Other malaise: Secondary | ICD-10-CM

## 2017-08-18 LAB — PULMONARY FUNCTION TEST
FEF 25-75 PRE: 1.58 L/s
FEF2575-%Pred-Pre: 45 %
FEV1-%Pred-Pre: 47 %
FEV1-PRE: 1.91 L
FEV1FVC-%Pred-Pre: 98 %
FEV6-%Pred-Pre: 50 %
FEV6-Pre: 2.51 L
FEV6FVC-%Pred-Pre: 104 %
FVC-%PRED-PRE: 48 %
FVC-PRE: 2.51 L
PRE FEV1/FVC RATIO: 76 %
PRE FEV6/FVC RATIO: 100 %

## 2017-08-18 LAB — BASIC METABOLIC PANEL
Anion gap: 11 (ref 5–15)
BUN: 19 mg/dL (ref 6–20)
CALCIUM: 8.6 mg/dL — AB (ref 8.9–10.3)
CO2: 28 mmol/L (ref 22–32)
CREATININE: 1.15 mg/dL (ref 0.61–1.24)
Chloride: 91 mmol/L — ABNORMAL LOW (ref 101–111)
GFR calc non Af Amer: >60 mL/min (ref >60)
Glucose, Bld: 94 mg/dL (ref 65–99)
Potassium: 3.8 mmol/L (ref 3.5–5.1)
SODIUM: 130 mmol/L — AB (ref 135–145)

## 2017-08-18 LAB — CBC
HCT: 33.1 % — ABNORMAL LOW (ref 39.0–52.0)
Hemoglobin: 10.9 g/dL — ABNORMAL LOW (ref 13.0–17.0)
MCH: 29.5 pg (ref 26.0–34.0)
MCHC: 32.9 g/dL (ref 30.0–36.0)
MCV: 89.5 fL (ref 78.0–100.0)
PLATELETS: 330 10*3/uL (ref 150–400)
RBC: 3.7 MIL/uL — AB (ref 4.22–5.81)
RDW: 14.7 % (ref 11.5–15.5)
WBC: 13.2 10*3/uL — AB (ref 4.0–10.5)

## 2017-08-18 MED ORDER — SODIUM CHLORIDE 0.9% FLUSH: 3.0000 mL | INTRAVENOUS | Status: DC | PRN

## 2017-08-18 MED ORDER — ASPIRIN 81 MG PO CHEW
81.0000 mg | CHEWABLE_TABLET | ORAL | Status: AC
  Administered 2017-08-19: 81 mg via ORAL
  Filled 2017-08-18: qty 1

## 2017-08-18 MED ORDER — SODIUM CHLORIDE 0.9 % IV SOLN
INTRAVENOUS | Status: DC
  Administered 2017-08-19: 06:00:00 via INTRAVENOUS

## 2017-08-18 MED ORDER — SODIUM CHLORIDE 0.9% FLUSH
3.0000 mL | Freq: Two times a day (BID) | INTRAVENOUS | Status: DC
  Administered 2017-08-18: 3 mL via INTRAVENOUS

## 2017-08-18 MED ORDER — SODIUM CHLORIDE 0.9 % IV SOLN: 250.0000 mL | INTRAVENOUS | Status: DC | PRN

## 2017-08-18 NOTE — Progress Notes (Addendum)
Family Medicine Teaching Service Daily Progress Note Intern Pager: [phone]  Patient name: Marcus [name] Medical record number: [id number] Date of birth: 1965/09/26 Age: 52 y.o. Gender: male  Primary Care Provider: Patient, No Pcp Per Consultants: CCM, ID, Cardiology  Code Status: Full   Pt Overview and Major Events to Date:  3/09: Admit forsepsis thought to be secondary to HAP and new onsetHF,cultures obtained, broad-spectrum antibiotics initiated 3/10: PCCM consulted,tolerating BiPAP 3/10: thoracentesis 3/14 TEE with severe MR, CVTS consulted  3/21 plan for full dental extraction   Assessment and Plan: Marcus [name] a 52 y.o.malepresenting with worsening SOB. 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  Endocarditis of mitral valve h/oembolic strokeand metabolic encephalopathy new onset heart failure Recently diagnosed with mitral valve endocarditis in January 2019 with methicillin sensitive staph aureus bacteremia and septic left knee arthritis status post aspiration.Patient does have signs concerning for new onset heart failure 2/2 vavular dysfunction. Echo on 3/10 showing worsening LVEF to 55%, vegetation in anterior mitral leaflet, and severe mitral regurgiation.Per ID appears to be sterile vegetation.TEE showing normal LVF, mild LAE, mitral valve thickening w/ mobilemass on anterior leaflet flail cord. 0.5cm round perforation, severe regurgitation. Orthopantogram showing poor dentition with possible abscess.  Dentistry consulted, d/w with Dr. Kristin [name] plan for full extraction 11A 3/21.  -cardiology consulted, appreciate recommendations: lasix 40mg  BID, await results of tuneup prior to potential MV replacement  -daily weight -strict Is and Os -CVTS consulted, appreciate recommendations: future left and right heart cath, treat for endocarditis, will follow up on 08/18/17  -continue  ancef (3/15-) -ID following: keep on ancef pending CVTS sx (end date 09/24/17)  Anemia Current Hgb9.7 on 3/14. Stable at 10.3 this AM. Appears to be chronically low with baseline of 8-9. Normocytic  -continue to monitor with daily CBC  Left knee edema h/o left MSSA septic arthritis Presented with lower extremity edema. Improved on exam this morning. Does have recent septic arthritis of the knee.bilateraly lower extremity dopplers negative for DVT. -continue to monitor   Hepatitis C genotype 2B Noted during previous hospitalization. No prior treatment. Recent viral load 224K.LFTsstable.  -ID consulted, appreciate recommendations: recommended outpatient tx   Compression deformities of mid to lower thoracic spine Patientreports history of fall back in January 2019he continues to have persistent low back pain since discharge. Findings of uncertain age mid to lower thoracic spine compression deformities on CXR. Patient reports use of OxyContin and oxycodone for pain control though no medications per chart review. PT recommending SNF.  -d/c fentanyl -add percocet 5-325 q6H scheduled -MRI spine - needs sedation, will clarify with mri/anesthesia   Polysubstance abuse UDS positive for barbiturates and opiates on presentation. Also has history of cocaine back in January 2019. Has known endocarditis likely from IV drug use though patient reports no recentillicit drug use. Ethanol level within normal limits on presentation. Currently on CIWA and COWS.CIWA overnight to<5. COWS overnight <8. -Continue onCIWA  -Daily folate and thiamine -monitor COWS score -decrease ativan to 0.5mg  QD scheduled; plan to discontinue today  -percocet 5 Q6  Hyponatremia: question primary polydipsia as patient has been sleeping much of the time.   -encourage PO hydration   FEN/GI:heart healthy diet JYN:WGNF, lovenox  Disposition: pending clinical improvement s/p dental procedure 3/21 and  potential valve replacement  Subjective:  Patient feeling well and worried about proceudre upcoming but is consenting to treatment  Objective: Temp:  [98.1 F (36.7 C)-98.7 F (37.1 C)]  98.7 F (37.1 C) (03/18 0445) Pulse Rate:  [70-91] 70 (03/18 0445) Resp:  [14-21] 17 (03/18 0445) BP: (91-105)/(66-68) 105/68 (03/18 0445) SpO2:  [98 %-99 %] 98 % (03/18 0445) Weight:  [125 lb 6.4 oz (56.9 kg)] 125 lb 6.4 oz (56.9 kg) (03/18 0445) Physical Exam: General: 52 yo male, NAD, resting comfortably   Cardiovascular: RRR, grade 3 murmur  Respiratory: CTAB, no wheeze, normal effort  Abdomen: soft, NTND, +bs  Extremities: non tender, no edema  Laboratory: Recent Labs  Lab 08/16/17 0357 08/17/17 0504 08/18/17 0605  WBC 9.1 13.4* 13.2*  HGB 10.2* 10.3* 10.9*  HCT 32.9* 32.5* 33.1*  PLT 289 305 330   Recent Labs  Lab 08/14/17 0531 08/15/17 1054 08/16/17 0357 08/17/17 0504 08/18/17 0605  NA 136 136 132* 131* 130*  K 3.9 4.2 3.9 3.9 3.8  CL 99* 101 95* 94* 91*  CO2 28 26 26 27 28   BUN 16 14 15  21* 19  CREATININE 1.07 1.16 1.15 1.20 1.15  CALCIUM 8.4* 8.5* 8.4* 8.3* 8.6*  PROT 6.8 6.7 6.8  --   --   BILITOT 0.7 0.5 0.4  --   --   ALKPHOS 96 88 101  --   --   ALT <5* <5* <5*  --   --   AST 16 22 20   --   --   GLUCOSE 98 124* 95 104* 94    Imaging/Diagnostic Tests: Dg Orthopantogram  Result Date: 08/15/2017 CLINICAL DATA:  Poor dentition.  Cardiac workup. EXAM: ORTHOPANTOGRAM/PANORAMIC COMPARISON:  None. FINDINGS: There are numerous missing maxillary and mandibular teeth, and multiple remaining maxillary teeth appear broken. There is mild periapical lucency associated with a left maxillary premolar tooth. IMPRESSION: Poor dentition with multiple missing and broken teeth. Mild periapical lucency associated with a left maxillary premolar tooth, abscess not excluded. Electronically Signed   By: Marcus [name] M.D.   On: 08/15/2017 09:12   Dg Chest 2 View  Result Date:  08/14/2017 CLINICAL DATA:  Followup pneumonia. EXAM: CHEST - 2 VIEW COMPARISON:  08/10/2016 and older studies. FINDINGS: Moderate right and small left pleural effusions. There is associated lung base opacity, greater on the right, which is consistent with atelectasis, pneumonia or a combination. The patchy airspace opacity noted along the perihilar left lung on the prior study has improved. Airspace opacity in the right mid to lower lung appears somewhat less confluent but is unchanged in extent. No new lung abnormalities.  No pneumothorax. IMPRESSION: 1. Mild improvement in lung consolidation compared to the prior exam. 2. Persistent moderate right and small left pleural effusions. No new abnormalities. Electronically Signed   By: Amie Portland M.D.   On: 08/14/2017 13:16   Dg Chest 2 View  Addendum Date: 08/09/2017   ADDENDUM REPORT: 08/09/2017 15:41 ADDENDUM: After further review, there are compression deformities of 2 adjacent vertebral bodies in the mid to lower thoracic spine, of uncertain age but new compared to an older chest x-ray 02/15/2005. Would consider further characterization with CT. These results were called by telephone at the time of interpretation on 08/09/2017 at 3:40 pm to Dr. Doug Sou , who verbally acknowledged these results. Electronically Signed   By: Bary Richard M.D.   On: 08/09/2017 15:41   Result Date: 08/09/2017 CLINICAL DATA:  Shortness of breath for 2 days. Bacterial meningitis diagnosed at the beginning of January, than recently diagnosed with pneumonia. History of TIA in January with left-sided weakness. Former smoker. EXAM: CHEST - 2 VIEW COMPARISON:  Chest x-ray dated 06/08/2017. FINDINGS: New dense opacity at the right lung base, likely a combination of consolidation and pleural effusion. Pleural effusion component is at least moderate in size. Patchy opacities are seen within the left perihilar and lower lung zones, favor edema. Additional opacity at the left lung base  is likely a combination of atelectasis and small pleural effusion. Heart size and mediastinal contours are stable. Osseous structures about the chest are unremarkable. IMPRESSION: 1. New dense opacity at the right lung base, likely a combination of pneumonia or atelectasis and pleural effusion. The pleural effusion component is at least moderate in size. Would consider chest CT for further characterization. 2. Additional patchy ill-defined opacities within the left mid and lower lung, most likely edema, pneumonia considered less likely. 3. Probable mild atelectasis and/or small pleural effusion at the left lung base. Electronically Signed: By: Bary Richard M.D. On: 08/09/2017 15:29   Ct Angio Chest Pe W And/or Wo Contrast  Result Date: 08/09/2017 CLINICAL DATA:  SOB x2 days. Pt diagnosed with bacterial meningitis at the beginning of January, then recently diagnosed with pneumonia. Pt also has hx of TIA beginning of January that left him with left sided weakness. Patient had left knee surgery x 6 weeks ago for sepsis. EXAM: CT ANGIOGRAPHY CHEST WITH CONTRAST TECHNIQUE: Multidetector CT imaging of the chest was performed using the standard protocol during bolus administration of intravenous contrast. Multiplanar CT image reconstructions and MIPs were obtained to evaluate the vascular anatomy. CONTRAST:  ISOVUE-370 IOPAMIDOL (ISOVUE-370) INJECTION 76% COMPARISON:  Current chest radiographs and prior studies. FINDINGS: Cardiovascular: Satisfactory opacification of the pulmonary arteries to the segmental level. No evidence of pulmonary embolism. Heart is normal in size and configuration. No pericardial effusion. No coronary artery calcifications. The great vessels normal in caliber. No aortic atherosclerosis. No dissection. Mediastinum/Nodes: No neck base or axillary masses or pathologically enlarged lymph nodes. No mediastinal or hilar masses or discrete enlarged lymph nodes. The trachea is patent and normal  in caliber. Esophagus is unremarkable. Lungs/Pleura: Large right and small left pleural effusions. Complete atelectasis of the right middle lobe and near complete atelectasis of the right lower lobe. Small area of consolidation in the anterior inferior right upper lobe. Patchy consolidation is noted in a peribronchovascular distribution in the left upper lobe and, to lesser degree, left lower lobe. There is dependent opacity also in the left lower lobe consistent with atelectasis. No pneumothorax. Upper Abdomen: No acute abnormality. Musculoskeletal: There is marked narrowing of the T6-T7 disc space with resorption/irregularity of the lower endplate of T6 in upper endplate of T7, with mild loss of height of these vertebra. This leads to a mild focal kyphosis. This is new since a chest radiograph dated 02/15/2005. A subtle lucency across the T6 spinous process posteriorly suggests a previous fracture. All remaining vertebral bodies are normal in height. No osteoblastic or osteolytic lesions. Review of the MIP images confirms the above findings. IMPRESSION: 1. Patchy areas of consolidation in both lungs consistent with multifocal pneumonia. 2. Complete atelectasis of the right middle lobe and near complete atelectasis of the right lower lobe. Mild dependent atelectasis in the left lower lobe. 3. Large right and small left pleural effusions. 4. Marked loss of the disc space at T6-C7 with irregular resorption of endplates leading to loss of vertebral body height of T6 and T7 and a focal kyphosis. This may reflect the sequelae of an old fracture. It could be due to previous discitis/osteomyelitis. Electronically Signed   By:  Amie Portlandavid  Ormond M.D.   On: 08/09/2017 18:44   Mr Brain Wo Contrast  Result Date: 08/14/2017 CLINICAL DATA:  52 y/o  M; stroke follow-up. EXAM: MRI HEAD WITHOUT CONTRAST TECHNIQUE: Axial DWI sequence was acquired. Patient combative, unable to continue examination. COMPARISON:  06/09/2017 MRI head.   06/08/2017 CT head. FINDINGS: Low B value DWI sequence demonstrates areas of T2 hyperintensity in the right greater than left frontal, parietal, and occipital lobes as well as splenium of corpus callosum and cerebellum corresponding to infarctions on prior MRI. The high B value sequence is motion degraded, insufficient to assess for reduced diffusion and acute stroke. IMPRESSION: Severe motion degraded DWI sequence insufficient to assess for acute stroke. Repeat imaging is recommended when patient is able to hold still and follow commands. Electronically Signed   By: Mitzi HansenLance  Furusawa-Stratton M.D.   On: 08/14/2017 21:38   Dg Chest Port 1 View  Result Date: 08/10/2017 CLINICAL DATA:  Post right thora, 1.4 L removed EXAM: PORTABLE CHEST 1 VIEW COMPARISON:  Chest x-ray dated 08/09/2017. FINDINGS: Some improvement in aeration at the left lung base. Residual opacity is presumably atelectasis or pneumonia. Ill-defined opacities persist throughout the left lung, consistent with the multifocal pneumonia better demonstrated on chest CT of 08/09/2017. Additional persistent atelectasis at the left lung base. No pneumothorax. Heart size and mediastinal contours appear stable. IMPRESSION: 1. Improved aeration at the right lung base status post thoracentesis. Residual opacity is presumably atelectasis or pneumonia. No pneumothorax seen. 2. Stable patchy opacities throughout the left lung, compatible with the multifocal pneumonia demonstrated on yesterday's chest CT. Electronically Signed   By: Bary RichardStan  Maynard M.D.   On: 08/10/2017 14:54   Ct Angio Chest/abd/pel For Dissection W And/or W/wo  Result Date: 08/14/2017 CLINICAL DATA:  Septic arterial embolism EXAM: CT ANGIOGRAPHY CHEST, ABDOMEN AND PELVIS TECHNIQUE: Multidetector CT imaging through the chest, abdomen and pelvis was performed using the standard protocol during bolus administration of intravenous contrast. Multiplanar reconstructed images and MIPs were obtained and  reviewed to evaluate the vascular anatomy. CONTRAST:  100 cc ISOVUE-370 IOPAMIDOL (ISOVUE-370) INJECTION 76% COMPARISON:  Chest CT 08/09/2017 FINDINGS: CTA CHEST FINDINGS Cardiovascular: No acute pulmonary embolus. Satisfactory opacification of the aorta and pulmonary arteries. Normal size heart without pericardial effusion. Normal branch pattern of the great vessels off the arch. No coronary arteriosclerosis. No aortic atherosclerosis. No aneurysm. No aortic dissection. Mediastinum/Nodes: No supraclavicular, axillary, mediastinal nor hilar lymphadenopathy. Trachea is patent and normal caliber without intraluminal abnormality. Esophagus is unremarkable. No thyromegaly is noted. Lungs/Pleura: Bilateral moderate-sized pleural effusions slightly smaller on the right with loculated component. Adjacent compressive atelectasis is noted. Patchy airspace opacities in the left upper lobe are redemonstrated, with slight improvement in aeration since prior though the vast majority of the pulmonary opacity still persists. Musculoskeletal: Redemonstration of marked disc space narrowing C6-7 with endplate irregularity of the inferior endplate of T6 superior endplate of T7 with resultant mild focal kyphosis. Review of the MIP images confirms the above findings. CTA ABDOMEN AND PELVIS FINDINGS VASCULAR Aorta: Normal caliber aorta without aneurysm, dissection, vasculitis or significant stenosis. Celiac: Patent without evidence of aneurysm, dissection, vasculitis or significant stenosis. SMA: Patent without evidence of aneurysm, dissection, vasculitis or significant stenosis. Renals: Single renal arteries bilaterally without acute abnormality. No significant stenosis, dissection or evidence of fibromuscular dysplasia. IMA: Patent Inflow: No aneurysm or dissection. Minimal atherosclerotic plaque in the proximal right common iliac and both distal iliac arteries. No significant stenosis. No dissection or aneurysm. Veins: No obvious  venous abnormality within the limitations of this arterial phase study. Review of the MIP images confirms the above findings. NON-VASCULAR Hepatobiliary: No focal liver abnormality is seen. No gallstones, gallbladder wall thickening, or biliary dilatation. Pancreas: Unremarkable. No pancreatic ductal dilatation or surrounding inflammatory changes. Spleen: Heterogeneous enhancement likely due to timing of contrast bolus. No splenomegaly. Adrenals/Urinary Tract: No adrenal no renal masses. No nephrolithiasis nor obstructive uropathy. No hydroureteronephrosis. Unremarkable bladder. Stomach/Bowel: Nondistended stomach with normal small bowel rotation. Mild fluid-filled distention of small bowel loops with slight mural thickening of jejunum may reflect a mild small bowel enteritis. No mechanical source obstruction is seen. Colon is. Normal appearing appendix. Lymphatic: No abdominopelvic adenopathy. Reproductive: Normal size prostate and seminal vesicles. Other: Small amount of fluid in the left inguinal canal. Musculoskeletal: Plate and screw fixation of the left acetabulum. 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 No acute osseous abnormality. There is lower lumbar degenerative facet arthropathy. Minimal anterolisthesis of L4 on L5. Marked disc space narrowing at L5-S1. Review of the MIP images confirms the above findings. IMPRESSION: Chest CT: 1. No acute pulmonary embolus. 2. No aortic aneurysm or dissection. 3. Bilateral moderate-sized pleural effusions slightly smaller on the right with loculated component redemonstrated. Adjacent compressive atelectasis is seen both lower lobes. 4. Relatively unchanged patchy airspace opacity in the left upper lobe only minimally improved in aeration. 5. Redemonstration of focal kyphosis at T6-7 secondary to endplate irregularities and disc space narrowing suspicious for changes of discitis/osteomyelitis. MRI would be the study of choice for further correlation as to  acuity. Abdomen and pelvic CT: 1. No aortic aneurysm or dissection. Patent branch vessels without significant stenosis or focal abnormality. 2. Mild fluid-filled distention of small bowel which transmural thickening suspicious for changes of mild enteritis. No mechanical bowel obstruction. Electronically Signed   By: Tollie Eth M.D.   On: 08/14/2017 21:21   US Thoracentesis Asp Pleural Space W/img Guide  Result Date: 08/10/2017 INDICATION: Respiratory failure. Large right pleural effusion. Request for diagnostic and therapeutic thoracentesis. EXAM: ULTRASOUND GUIDED RIGHT THORACENTESIS MEDICATIONS: None. COMPLICATIONS: None immediate. PROCEDURE: An ultrasound guided thoracentesis was thoroughly discussed with the patient and questions answered. The benefits, risks, alternatives and complications were also discussed. The patient understands and wishes to proceed with the procedure. Written consent was obtained. Ultrasound was performed to localize and mark an adequate pocket of fluid in the right chest. The area was then prepped and draped in the normal sterile fashion. 1% Lidocaine was used for local anesthesia. Under ultrasound guidance a Safe-T-Centesis catheter was introduced. Thoracentesis was performed. The catheter was removed and a dressing applied. FINDINGS: A total of approximately 1.4 L of pink/blood-tinged fluid was removed. Samples were sent to the laboratory as requested by the clinical team. IMPRESSION: Successful ultrasound guided right thoracentesis yielding 1.4 L of pleural fluid. Read by: Brayton El PA-C Electronically Signed   By: Corlis Leak M.D.   On: 08/10/2017 12:55    Marthenia Rolling, DO 08/18/2017, 9:29 AM PGY-2, [facility] FPTS Intern pager: [phone], text pages welcome

## 2017-08-18 NOTE — Progress Notes (Signed)
Progress Note  Patient Name: Marcus [name] Date of Encounter: 08/18/2017  Primary Cardiologist: Armanda Magic, MD   Subjective   Denies CP or dyspnea; patient complains of pain in back at recent thoracentesis site.  Inpatient Medications    Scheduled Meds: . enoxaparin (LOVENOX) injection  40 mg Subcutaneous Q24H  . feeding supplement (ENSURE ENLIVE)  237 mL Oral TID BM  . folic acid  1 mg Oral Daily  . furosemide  40 mg Intravenous BID  . LORazepam  1 mg Intravenous Once  . multivitamin with minerals  1 tablet Oral Daily  . mupirocin ointment   Nasal BID  . oxyCODONE-acetaminophen  1 tablet Oral Q6H  . thiamine  100 mg Oral Daily   Or  . thiamine  100 mg Intravenous Daily   Continuous Infusions: .  ceFAZolin (ANCEF) IV Stopped (08/18/17 0259)   PRN Meds: acetaminophen, albuterol, fentaNYL (SUBLIMAZE) injection   Vital Signs    Vitals:   08/17/17 1927 08/17/17 1950 08/17/17 2000 08/18/17 0445  BP: 91/66   105/68  Pulse: 81   70  Resp: 14 17 (!) 21 17  Temp: 98.1 F (36.7 C)   98.7 F (37.1 C)  TempSrc: Oral   Axillary  SpO2: 99%   98%  Weight:    125 lb 6.4 oz (56.9 kg)  Height:        Intake/Output Summary (Last 24 hours) at 08/18/2017 0734 Last data filed at 08/18/2017 0447 Gross per 24 hour  Intake 1437 ml  Output 1350 ml  Net 87 ml   Filed Weights   08/16/17 0405 08/17/17 0424 08/18/17 0445  Weight: 128 lb 8.5 oz (58.3 kg) 125 lb 4.8 oz (56.8 kg) 125 lb 6.4 oz (56.9 kg)    Telemetry    NSR- Personally Reviewed   Physical Exam   GEN: No acute distress. Frail  Neck: No JVD Cardiac: RRR, 3/6 systolic murmur LSB Respiratory: Diminished BS RLL GI: Soft, nontender, non-distended  MS: No edema; mild tenderness left knee Neuro:  Nonfocal  Psych: Normal affect   Labs    Chemistry Recent Labs  Lab 08/14/17 0531 08/15/17 1054 08/16/17 0357 08/17/17 0504 08/18/17 0605  NA 136 136 132* 131* 130*  K 3.9 4.2 3.9 3.9 3.8  CL 99* 101 95* 94*  91*  CO2 28 26 26 27 28   GLUCOSE 98 124* 95 104* 94  BUN 16 14 15  21* 19  CREATININE 1.07 1.16 1.15 1.20 1.15  CALCIUM 8.4* 8.5* 8.4* 8.3* 8.6*  PROT 6.8 6.7 6.8  --   --   ALBUMIN 2.2* 2.2* 2.3*  --   --   AST 16 22 20   --   --   ALT <5* <5* <5*  --   --   ALKPHOS 96 88 101  --   --   BILITOT 0.7 0.5 0.4  --   --   GFRNONAA >60 >60 >60 >60 >60  GFRAA >60 >60 >60 >60 >60  ANIONGAP 9 9 11 10 11      Hematology Recent Labs  Lab 08/16/17 0357 08/17/17 0504 08/18/17 0605  WBC 9.1 13.4* 13.2*  RBC 3.63* 3.66* 3.70*  HGB 10.2* 10.3* 10.9*  HCT 32.9* 32.5* 33.1*  MCV 90.6 88.8 89.5  MCH 28.1 28.1 29.5  MCHC 31.0 31.7 32.9  RDW 14.6 14.4 14.7  PLT 289 305 330    Cardiac Enzymes Recent Labs  Lab 08/11/17 0913  TROPONINI <0.03    Patient Profile  52 y.o. 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  Patient continues to improve.  Continue antibiotics.  Infectious disease is following.  Patient was unable to cooperate with brain MRI.  Follow-up study under anesthesia has been ordered with additional imaging of the thoracic and lumbar spine to R/O discitis.  Patient has had dental consult and teeth extraction planned for later this week.  Will arrange right and left cardiac catheterization tomorrow.  The risks and benefits including myocardial infarction, CVA and death discussed and he agrees to proceed.  Patient has been seen by Dr. Cornelius Moraswen with possible plans for mitral valve replacement after he improves.  2 acute diastolic congestive heart failure-patient has improved since admission.  Continue present dose of Lasix and follow renal function.  Plan right heart catheterization tomorrow.  3 severe mitral regurgitation-plan as outlined above.  4 severe malnutrition-patient has had a  nutrition consult.  Overall improving.  5 substance abuse-patient counseled on avoiding.  For questions or updates, please contact [facility] Please consult [email or web address] for contact info under Cardiology/STEMI.      Signed, Olga MillersBrian Rivaldo Hineman, MD  08/18/2017, 7:34 AM

## 2017-08-18 NOTE — Progress Notes (Signed)
CSW continuing to follow. Will support with disposition planning pending medical readiness.  Abigail ButtsSusan Emiyah Spraggins, LCSWA [phone]

## 2017-08-18 NOTE — Progress Notes (Addendum)
Family Medicine Teaching Service Daily Progress Note Intern Pager: [phone]  Patient name: Marcus [name] Medical record number: [id number] Date of birth: 08/01/1965 Age: 52 y.o. Gender: male  Primary Care Provider: Patient, No Pcp Per Consultants: CCM, ID, Cardiology, CVTS, Dental  Code Status: Full   Pt Overview and Major Events to Date:  3/09: Admit forsepsis thought to be secondary to HAP and new onsetHF,cultures obtained, broad-spectrum antibiotics initiated 3/10: PCCM consulted,tolerating BiPAP 3/10: thoracentesis 3/14 TEE with severe MR, CVTS consulted  3/21 plan for full dental extraction   Assessment and Plan: Marcus LeesByron T Loweis a 52 y.o.malepresenting with worsening SOB. 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  Endocarditis of mitral valve h/oembolic strokeand metabolic encephalopathy new onset heart failure Patient does have signs concerning for new onset heart failure 2/2 vavular dysfunction. Leukocytosis increased to 15.9. Per discussion with Dr. Orvan Falconerampbell, ID, ID not concerned of worsening infection as patient appears clinically improved today. Per ID WBC count can be unreliable in hospital setting in certain cases.  -cardiology consulted, appreciate recommendations: MRI under general anesthesia on 3/19, L&R heart cath 3/19, continue lasix -daily weight -strict Is and Os -CVTS consulted, appreciate recommendations: L&R heart cath planned for 3/19 -continue ancef (3/15-) -ID following: keep on ancef pending CVTS sx (end date 09/24/17) -Dentistry consulted, appreciate recommendations: plan for full extraction 11A 3/21.  -will add differential to r/u viral process  Anemia Current Hgb9.6. Appears to be chronically low with baseline of 8-9. Normocytic  -continue to monitor with daily CBC  Left knee edema h/o left MSSA septic arthritis Presented with lower extremity edema.  Does have recent septic arthritis of the knee.DVT r/o with LE dopplers.  -continue to monitor   Hepatitis C genotype 2B Noted during previous hospitalization. No prior treatment. Recent viral load 224K.LFTsstable.  -ID consulted, appreciate recommendations: recommended outpatient tx   Compression deformities of mid to lower thoracic spine Patientreports history of fall back in January 2019he continues to have persistent low back pain since discharge. Patient reports use of OxyContin and oxycodone for pain control though no medications per chart review. PT recommending SNF.  -continue percocet 5-325 q6H scheduled, wean as tolerated  -MRI spine under general anesthesia    Polysubstance abuse UDS positive for barbiturates and opiates on presentation. Also has history of cocaine back in January 2019. Has known endocarditis likely from IV drug use though patient reports no recentillicit drug use. Currently on CIWA and COWS.CIWA overnight to5. COWS overnight 8>5>8>6>6. -Continue onCIWA  -Daily folate and thiamine -monitor COWS score -percocet 5 Q6, can plan to wean as tolerated   Hyponatremia Current Na 128. Likely 2/2 poor oral intake as patient has been NPO on and off for several days and likely had poor oral intake prior to admission.  -monitor on daily BMP -encourage PO intake   Hypokalemia K of 3.2  -replete with KDur as needed -monitor on daily BMP  Protein calorie malnutrition  Albumin of 2.3 on 3/16. Likely 2/2 poor oral intake -consult nutrition, appreciate recommendations  Disposition Recommended by therapy for inpatient rehab. Screened by Fae PippinMelissa Bowie who also recommends CIR consult. -order for consult for CIR placement placed  FEN/GI:heart healthy diet GEX:BMWUPPx:SCDs, have discontinued lovenox for dental procedure.   Disposition: continued inpatient stay   Subjective:  Patient states he is doing better today. Appears in better spirits. Reports continued  back pain, states pain medications doesn't help much. Reports no SOB.  Objective: Temp:  [97.9 F (36.6 C)-98.2 F (36.8 C)] 98.2 F (36.8 C) (03/19 0550) Pulse Rate:  [0-88] 80 (03/19 0824) Resp:  [0-18] 17 (03/19 1000) BP: (92-120)/(61-79) 110/69 (03/19 1000) SpO2:  [0 %-100 %] 95 % (03/19 1000) Weight:  [106 lb 4.2 oz (48.2 kg)] 106 lb 4.2 oz (48.2 kg) (03/19 0550) Physical Exam: General: awake and alert, laying in bed, NAD Cardiovascular: RRR, grade 3 systolic murmur Respiratory: CTAB, no wheezes, rales, or rhonchi, no increased work of breathing, able to speak full sentences.  Abdomen: soft, non tender, non distended, bowel sounds normal Ext: no edema, non tender, no pain to knee exam MSK: no paravertebral tenderness, no tenderness to palpation to musculature surrounding spine   Laboratory: Recent Labs  Lab 08/18/17 0605 08/19/17 0340 08/19/17 0952  WBC 13.2* 15.9* 14.3*  HGB 10.9* 9.6* 9.5*  HCT 33.1* 30.4* 29.8*  PLT 330 321 265   Recent Labs  Lab 08/14/17 0531 08/15/17 1054 08/16/17 0357 08/17/17 0504 08/18/17 0605 08/19/17 0340 08/19/17 0952  NA 136 136 132* 131* 130* 128*  --   K 3.9 4.2 3.9 3.9 3.8 3.2*  --   CL 99* 101 95* 94* 91* 88*  --   CO2 28 26 26 27 28 27   --   BUN 16 14 15  21* 19 22*  --   CREATININE 1.07 1.16 1.15 1.20 1.15 1.23 1.10  CALCIUM 8.4* 8.5* 8.4* 8.3* 8.6* 8.4*  --   PROT 6.8 6.7 6.8  --   --   --   --   BILITOT 0.7 0.5 0.4  --   --   --   --   ALKPHOS 96 88 101  --   --   --   --   ALT <5* <5* <5*  --   --   --   --   AST 16 22 20   --   --   --   --   GLUCOSE 98 124* 95 104* 94 89  --      Imaging/Diagnostic Tests: Dg Orthopantogram  Result Date: 08/15/2017 CLINICAL DATA:  Poor dentition.  Cardiac workup. EXAM: ORTHOPANTOGRAM/PANORAMIC COMPARISON:  None. FINDINGS: There are numerous missing maxillary and mandibular teeth, and multiple remaining maxillary teeth appear broken. There is mild periapical lucency associated with a  left maxillary premolar tooth. IMPRESSION: Poor dentition with multiple missing and broken teeth. Mild periapical lucency associated with a left maxillary premolar tooth, abscess not excluded. Electronically Signed   By: Sebastian Ache M.D.   On: 08/15/2017 09:12   Dg Chest 2 View  Result Date: 08/14/2017 CLINICAL DATA:  Followup pneumonia. EXAM: CHEST - 2 VIEW COMPARISON:  08/10/2016 and older studies. FINDINGS: Moderate right and small left pleural effusions. There is associated lung base opacity, greater on the right, which is consistent with atelectasis, pneumonia or a combination. The patchy airspace opacity noted along the perihilar left lung on the prior study has improved. Airspace opacity in the right mid to lower lung appears somewhat less confluent but is unchanged in extent. No new lung abnormalities.  No pneumothorax. IMPRESSION: 1. Mild improvement in lung consolidation compared to the prior exam. 2. Persistent moderate right and small left pleural effusions. No new abnormalities. Electronically Signed   By: Amie Portland M.D.   On: 08/14/2017 13:16   Dg Chest 2 View  Addendum Date: 08/09/2017   ADDENDUM REPORT: 08/09/2017 15:41 ADDENDUM: After further review, there are compression deformities of 2 adjacent vertebral bodies in  the mid to lower thoracic spine, of uncertain age but new compared to an older chest x-ray 02/15/2005. Would consider further characterization with CT. These results were called by telephone at the time of interpretation on 08/09/2017 at 3:40 pm to Dr. Doug Sou , who verbally acknowledged these results. Electronically Signed   By: Bary Richard M.D.   On: 08/09/2017 15:41   Result Date: 08/09/2017 CLINICAL DATA:  Shortness of breath for 2 days. Bacterial meningitis diagnosed at the beginning of January, than recently diagnosed with pneumonia. History of TIA in January with left-sided weakness. Former smoker. EXAM: CHEST - 2 VIEW COMPARISON:  Chest x-ray dated  06/08/2017. FINDINGS: New dense opacity at the right lung base, likely a combination of consolidation and pleural effusion. Pleural effusion component is at least moderate in size. Patchy opacities are seen within the left perihilar and lower lung zones, favor edema. Additional opacity at the left lung base is likely a combination of atelectasis and small pleural effusion. Heart size and mediastinal contours are stable. Osseous structures about the chest are unremarkable. IMPRESSION: 1. New dense opacity at the right lung base, likely a combination of pneumonia or atelectasis and pleural effusion. The pleural effusion component is at least moderate in size. Would consider chest CT for further characterization. 2. Additional patchy ill-defined opacities within the left mid and lower lung, most likely edema, pneumonia considered less likely. 3. Probable mild atelectasis and/or small pleural effusion at the left lung base. Electronically Signed: By: Bary Richard M.D. On: 08/09/2017 15:29   Ct Angio Chest Pe W And/or Wo Contrast  Result Date: 08/09/2017 CLINICAL DATA:  SOB x2 days. Pt diagnosed with bacterial meningitis at the beginning of January, then recently diagnosed with pneumonia. Pt also has hx of TIA beginning of January that left him with left sided weakness. Patient had left knee surgery x 6 weeks ago for sepsis. EXAM: CT ANGIOGRAPHY CHEST WITH CONTRAST TECHNIQUE: Multidetector CT imaging of the chest was performed using the standard protocol during bolus administration of intravenous contrast. Multiplanar CT image reconstructions and MIPs were obtained to evaluate the vascular anatomy. CONTRAST:  ISOVUE-370 IOPAMIDOL (ISOVUE-370) INJECTION 76% COMPARISON:  Current chest radiographs and prior studies. FINDINGS: Cardiovascular: Satisfactory opacification of the pulmonary arteries to the segmental level. No evidence of pulmonary embolism. Heart is normal in size and configuration. No pericardial  effusion. No coronary artery calcifications. The great vessels normal in caliber. No aortic atherosclerosis. No dissection. Mediastinum/Nodes: No neck base or axillary masses or pathologically enlarged lymph nodes. No mediastinal or hilar masses or discrete enlarged lymph nodes. The trachea is patent and normal in caliber. Esophagus is unremarkable. Lungs/Pleura: Large right and small left pleural effusions. Complete atelectasis of the right middle lobe and near complete atelectasis of the right lower lobe. Small area of consolidation in the anterior inferior right upper lobe. Patchy consolidation is noted in a peribronchovascular distribution in the left upper lobe and, to lesser degree, left lower lobe. There is dependent opacity also in the left lower lobe consistent with atelectasis. No pneumothorax. Upper Abdomen: No acute abnormality. Musculoskeletal: There is marked narrowing of the T6-T7 disc space with resorption/irregularity of the lower endplate of T6 in upper endplate of T7, with mild loss of height of these vertebra. This leads to a mild focal kyphosis. This is new since a chest radiograph dated 02/15/2005. A subtle lucency across the T6 spinous process posteriorly suggests a previous fracture. All remaining vertebral bodies are normal in height. No osteoblastic  or osteolytic lesions. Review of the MIP images confirms the above findings. IMPRESSION: 1. Patchy areas of consolidation in both lungs consistent with multifocal pneumonia. 2. Complete atelectasis of the right middle lobe and near complete atelectasis of the right lower lobe. Mild dependent atelectasis in the left lower lobe. 3. Large right and small left pleural effusions. 4. Marked loss of the disc space at T6-C7 with irregular resorption of endplates leading to loss of vertebral body height of T6 and T7 and a focal kyphosis. This may reflect the sequelae of an old fracture. It could be due to previous discitis/osteomyelitis. Electronically  Signed   By: Amie Portland M.D.   On: 08/09/2017 18:44   Mr Brain Wo Contrast  Result Date: 08/14/2017 CLINICAL DATA:  52 y/o  M; stroke follow-up. EXAM: MRI HEAD WITHOUT CONTRAST TECHNIQUE: Axial DWI sequence was acquired. Patient combative, unable to continue examination. COMPARISON:  06/09/2017 MRI head.  06/08/2017 CT head. FINDINGS: Low B value DWI sequence demonstrates areas of T2 hyperintensity in the right greater than left frontal, parietal, and occipital lobes as well as splenium of corpus callosum and cerebellum corresponding to infarctions on prior MRI. The high B value sequence is motion degraded, insufficient to assess for reduced diffusion and acute stroke. IMPRESSION: Severe motion degraded DWI sequence insufficient to assess for acute stroke. Repeat imaging is recommended when patient is able to hold still and follow commands. Electronically Signed   By: Mitzi Hansen M.D.   On: 08/14/2017 21:38   Dg Chest Port 1 View  Result Date: 08/10/2017 CLINICAL DATA:  Post right thora, 1.4 L removed EXAM: PORTABLE CHEST 1 VIEW COMPARISON:  Chest x-ray dated 08/09/2017. FINDINGS: Some improvement in aeration at the left lung base. Residual opacity is presumably atelectasis or pneumonia. Ill-defined opacities persist throughout the left lung, consistent with the multifocal pneumonia better demonstrated on chest CT of 08/09/2017. Additional persistent atelectasis at the left lung base. No pneumothorax. Heart size and mediastinal contours appear stable. IMPRESSION: 1. Improved aeration at the right lung base status post thoracentesis. Residual opacity is presumably atelectasis or pneumonia. No pneumothorax seen. 2. Stable patchy opacities throughout the left lung, compatible with the multifocal pneumonia demonstrated on yesterday's chest CT. Electronically Signed   By: Bary Richard M.D.   On: 08/10/2017 14:54   Ct Angio Chest/abd/pel For Dissection W And/or W/wo  Result Date:  08/14/2017 CLINICAL DATA:  Septic arterial embolism EXAM: CT ANGIOGRAPHY CHEST, ABDOMEN AND PELVIS TECHNIQUE: Multidetector CT imaging through the chest, abdomen and pelvis was performed using the standard protocol during bolus administration of intravenous contrast. Multiplanar reconstructed images and MIPs were obtained and reviewed to evaluate the vascular anatomy. CONTRAST:  100 cc ISOVUE-370 IOPAMIDOL (ISOVUE-370) INJECTION 76% COMPARISON:  Chest CT 08/09/2017 FINDINGS: CTA CHEST FINDINGS Cardiovascular: No acute pulmonary embolus. Satisfactory opacification of the aorta and pulmonary arteries. Normal size heart without pericardial effusion. Normal branch pattern of the great vessels off the arch. No coronary arteriosclerosis. No aortic atherosclerosis. No aneurysm. No aortic dissection. Mediastinum/Nodes: No supraclavicular, axillary, mediastinal nor hilar lymphadenopathy. Trachea is patent and normal caliber without intraluminal abnormality. Esophagus is unremarkable. No thyromegaly is noted. Lungs/Pleura: Bilateral moderate-sized pleural effusions slightly smaller on the right with loculated component. Adjacent compressive atelectasis is noted. Patchy airspace opacities in the left upper lobe are redemonstrated, with slight improvement in aeration since prior though the vast majority of the pulmonary opacity still persists. Musculoskeletal: Redemonstration of marked disc space narrowing C6-7 with endplate irregularity of the inferior  endplate of T6 superior endplate of T7 with resultant mild focal kyphosis. Review of the MIP images confirms the above findings. CTA ABDOMEN AND PELVIS FINDINGS VASCULAR Aorta: Normal caliber aorta without aneurysm, dissection, vasculitis or significant stenosis. Celiac: Patent without evidence of aneurysm, dissection, vasculitis or significant stenosis. SMA: Patent without evidence of aneurysm, dissection, vasculitis or significant stenosis. Renals: Single renal arteries  bilaterally without acute abnormality. No significant stenosis, dissection or evidence of fibromuscular dysplasia. IMA: Patent Inflow: No aneurysm or dissection. Minimal atherosclerotic plaque in the proximal right common iliac and both distal iliac arteries. No significant stenosis. No dissection or aneurysm. Veins: No obvious venous abnormality within the limitations of this arterial phase study. Review of the MIP images confirms the above findings. NON-VASCULAR Hepatobiliary: No focal liver abnormality is seen. No gallstones, gallbladder wall thickening, or biliary dilatation. Pancreas: Unremarkable. No pancreatic ductal dilatation or surrounding inflammatory changes. Spleen: Heterogeneous enhancement likely due to timing of contrast bolus. No splenomegaly. Adrenals/Urinary Tract: No adrenal no renal masses. No nephrolithiasis nor obstructive uropathy. No hydroureteronephrosis. Unremarkable bladder. Stomach/Bowel: Nondistended stomach with normal small bowel rotation. Mild fluid-filled distention of small bowel loops with slight mural thickening of jejunum may reflect a mild small bowel enteritis. No mechanical source obstruction is seen. Colon is. Normal appearing appendix. Lymphatic: No abdominopelvic adenopathy. Reproductive: Normal size prostate and seminal vesicles. Other: Small amount of fluid in the left inguinal canal. Musculoskeletal: Plate and screw fixation of the left acetabulum. 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 No acute osseous abnormality. There is lower lumbar degenerative facet arthropathy. Minimal anterolisthesis of L4 on L5. Marked disc space narrowing at L5-S1. Review of the MIP images confirms the above findings. IMPRESSION: Chest CT: 1. No acute pulmonary embolus. 2. No aortic aneurysm or dissection. 3. Bilateral moderate-sized pleural effusions slightly smaller on the right with loculated component redemonstrated. Adjacent compressive atelectasis is seen both lower lobes. 4.  Relatively unchanged patchy airspace opacity in the left upper lobe only minimally improved in aeration. 5. Redemonstration of focal kyphosis at T6-7 secondary to endplate irregularities and disc space narrowing suspicious for changes of discitis/osteomyelitis. MRI would be the study of choice for further correlation as to acuity. Abdomen and pelvic CT: 1. No aortic aneurysm or dissection. Patent branch vessels without significant stenosis or focal abnormality. 2. Mild fluid-filled distention of small bowel which transmural thickening suspicious for changes of mild enteritis. No mechanical bowel obstruction. Electronically Signed   By: Tollie Eth M.D.   On: 08/14/2017 21:21   US Thoracentesis Asp Pleural Space W/img Guide  Result Date: 08/10/2017 INDICATION: Respiratory failure. Large right pleural effusion. Request for diagnostic and therapeutic thoracentesis. EXAM: ULTRASOUND GUIDED RIGHT THORACENTESIS MEDICATIONS: None. COMPLICATIONS: None immediate. PROCEDURE: An ultrasound guided thoracentesis was thoroughly discussed with the patient and questions answered. The benefits, risks, alternatives and complications were also discussed. The patient understands and wishes to proceed with the procedure. Written consent was obtained. Ultrasound was performed to localize and mark an adequate pocket of fluid in the right chest. The area was then prepped and draped in the normal sterile fashion. 1% Lidocaine was used for local anesthesia. Under ultrasound guidance a Safe-T-Centesis catheter was introduced. Thoracentesis was performed. The catheter was removed and a dressing applied. FINDINGS: A total of approximately 1.4 L of pink/blood-tinged fluid was removed. Samples were sent to the laboratory as requested by the clinical team. IMPRESSION: Successful ultrasound guided right thoracentesis yielding 1.4 L of pleural fluid. Read by: Brayton El PA-C Electronically Signed  By: Corlis Leak M.D.   On: 08/10/2017 12:55     Oralia Manis, DO 08/19/2017, 10:58 AM PGY-1, [facility] FPTS Intern pager: [phone], text pages welcome

## 2017-08-18 NOTE — Progress Notes (Signed)
Physical Therapy Treatment Patient Details Name: Marcus [name] MRN: [id number] DOB: 01/20/1966 Today's Date: 08/18/2017    History of Present Illness Marcus [name] is a 52 y.o. male presenting with worsening SOB. 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   Had 1.4 L fluid removed from R lung via thoracentesis.  Dx w/ HCAP.    PT Comments    Pt has improved enough to warrant a review by CIR.  He has made great strides in ability to participate,  Emphasis on transitions to sit, scooting, sitting balance, standing activities, and gait with the RW   Follow Up Recommendations  CIR     Equipment Recommendations  Wheelchair cushion (measurements PT);Wheelchair (measurements PT);Hospital bed    Recommendations for Other Services Rehab consult     Precautions / Restrictions Precautions Precautions: Fall Restrictions Weight Bearing Restrictions: No    Mobility  Bed Mobility Overal bed mobility: Needs Assistance Bed Mobility: Sit to Sidelying Rolling: Max assist;Mod assist Sidelying to sit: Max assist;Mod assist     Sit to sidelying: Max assist;+2 for safety/equipment General bed mobility comments: Min A to sliding up in bed to optimize positioning for self feeding  Transfers Overall transfer level: Needs assistance Equipment used: Rolling walker (2 wheeled) Transfers: Sit to/from [employer] CorporationStand;Stand Pivot Transfers Sit to Stand: Mod assist;+2 physical assistance Stand pivot transfers: Max assist;+2 physical assistance Squat pivot transfers: +2 physical assistance;Total assist     General transfer comment: Worked on scooting to edge of the chair and back on the bed with good technique.  Cues fo hand placement and significant assist in to the RW.  Pt having difficulty moving his feet even in a pivotal nature.  Unable to w/bear unilaterally sufficiently to unweight to other  extremity.  Ambulation/Gait Ambulation/Gait assistance: Mod assist;Max assist;+2 physical assistance Ambulation Distance (Feet): 5 Feet(8) Assistive device: Rolling walker (2 wheeled) Gait Pattern/deviations: Step-to pattern;Decreased step length - right;Decreased step length - left;Decreased stride length;Shuffle   Gait velocity interpretation: Below normal speed for age/gender General Gait Details: pt able to ambulate short distances with significant stability and w/shift assist.  L LE advanced forward and adducted, R LE shuffled up to level of L LE, all with significant use of the RW and effort.   Stairs            Wheelchair Mobility    Modified Rankin (Stroke Patients Only)       Balance Overall balance assessment: Needs assistance Sitting-balance support: Single extremity supported;Bilateral upper extremity supported Sitting balance-Leahy Scale: Poor Sitting balance - Comments: can maintain sitting balancewith UE assist   Standing balance support: Bilateral upper extremity supported Standing balance-Leahy Scale: Zero Standing balance comment: stood in RW, needing significant assist for w/shift and moving feet..  Completed standing activity at the sink including washing face with R hand in and uncoordinated manner.  Pt able to squeeze the cloth with both hands, standing without UE assist for 1-2 sec.                            Cognition Arousal/Alertness: Awake/alert Behavior During Therapy: Flat affect Overall Cognitive Status: No family/caregiver present to determine baseline cognitive functioning Area of Impairment: Attention;Following commands;Safety/judgement;Awareness                   Current Attention Level: Selective   Following Commands: Follows one step commands with increased time Safety/Judgement:  Decreased awareness of safety Awareness: Emergent   General Comments: During self feeding, pt demosntrating decreased problem solving.  Required Max cues to adapt and optimize sequencing. For example, pt scooping apple sauce with RUE and apple sauce cup sliding around table. Providing cues to stablize cup with LUE.       Exercises Hand Exercises Digit Lifts: AROM;Both;5 reps;Seated(No finger extention in R hand; isolated or composite) Opposition: AROM;Both;5 reps;Seated(limited to 1st-4th finger)    General Comments General comments (skin integrity, edema, etc.): vss      Pertinent Vitals/Pain Pain Assessment: Faces Faces Pain Scale: Hurts little more Pain Location: L knee Pain Descriptors / Indicators: Grimacing;Guarding Pain Intervention(s): Monitored during session    Home Living                      Prior Function            PT Goals (current goals can now be found in the care plan section) Acute Rehab PT Goals Patient Stated Goal: none stated Time For Goal Achievement: 08/25/17 Potential to Achieve Goals: Fair Progress towards PT goals: Progressing toward goals    Frequency    Min 3X/week      PT Plan Current plan remains appropriate    Co-evaluation   Reason for Co-Treatment: For patient/therapist safety;To address functional/ADL transfers   OT goals addressed during session: ADL's and self-care      AM-PAC PT "6 Clicks" Daily Activity  Outcome Measure  Difficulty turning over in bed (including adjusting bedclothes, sheets and blankets)?: Unable Difficulty moving from lying on back to sitting on the side of the bed? : Unable Difficulty sitting down on and standing up from a chair with arms (e.g., wheelchair, bedside commode, etc,.)?: Unable Help needed moving to and from a bed to chair (including a wheelchair)?: A Lot Help needed walking in hospital room?: A Lot Help needed climbing 3-5 steps with a railing? : Total 6 Click Score: 8    End of Session Equipment Utilized During Treatment: Gait belt Activity Tolerance: Patient limited by fatigue Patient left: in chair;with  call bell/phone within reach;with chair alarm set Nurse Communication: Mobility status PT Visit Diagnosis: Other abnormalities of gait and mobility (R26.89);Muscle weakness (generalized) (M62.81);Difficulty in walking, not elsewhere classified (R26.2);Hemiplegia and hemiparesis Hemiplegia - Right/Left: Right Hemiplegia - caused by: Cerebral infarction     Time: 8657-8469 PT Time Calculation (min) (ACUTE ONLY): 49 min  Charges:  $Therapeutic Activity: 23-37 mins                    G Codes:       2017/08/20  Falls Bing, PT [phone]  (pager)   Eliseo Gum Terica Yogi 08/20/2017, 5:50 PM

## 2017-08-18 NOTE — Progress Notes (Addendum)
Occupational Therapy Treatment Patient Details Name: Marcus [name] MRN: [id number] DOB: 17-Nov-1965 Today's Date: 08/18/2017    History of present illness Marcus [name] is a 52 y.o. male presenting with worsening SOB. 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   Had 1.4 L fluid removed from R lung via thoracentesis.  Dx w/ HCAP.   OT comments  Pt progressing well towards goals. Pt performing grooming at sink with Mod A +2 for standing balance. Donning bedroom slippers with Min A for donning over right toes. Pt presenting with increased attention and problem solving during session and required Min verbal cues. Educated pt in hand exercises; pt performing finger opposition and digit lifts for 5 reps in recliner. Pt highly motivated to participate in therapy and family reports they are willing to provide 24 hour support at dc. Due to pt's age, progress with therapy, and deficits, update dc recommendation to CIR for intensive OT to optimize safety and independence with ADLs and fucnitonal mobility as well as decrease caregiver burden.    Follow Up Recommendations  CIR;Supervision/Assistance - 24 hour    Equipment Recommendations  Other (comment)(defer to next venue of care)    Recommendations for Other Services      Precautions / Restrictions Precautions Precautions: Fall Restrictions Weight Bearing Restrictions: No       Mobility Bed Mobility Overal bed mobility: Needs Assistance Bed Mobility: Sit to Sidelying Rolling: Max assist;Mod assist Sidelying to sit: Max assist;Mod assist     Sit to sidelying: Max assist;+2 for safety/equipment General bed mobility comments: Min A to sliding up in bed to optimize positioning for self feeding  Transfers Overall transfer level: Needs assistance Equipment used: Rolling walker (2 wheeled) Transfers: Sit to/from [employer] Sit to Stand: Mod  assist;+2 physical assistance Stand pivot transfers: Max assist;+2 physical assistance Squat pivot transfers: +2 physical assistance;Total assist     General transfer comment: Worked on scooting to edge of the chair and back on the bed with good technique.  Cues fo hand placement and significant assist in to the RW.  Pt having difficulty moving his feet even in a pivotal nature.  Unable to w/bear unilaterally sufficiently to unweight to other extremity.    Balance Overall balance assessment: Needs assistance Sitting-balance support: Single extremity supported;Bilateral upper extremity supported Sitting balance-Leahy Scale: Poor Sitting balance - Comments: can maintain sitting balancewith UE assist   Standing balance support: Bilateral upper extremity supported Standing balance-Leahy Scale: Zero Standing balance comment: stood in RW, needing significant assist for w/shift and moving feet..  Completed standing activity at the sink including washing face with R hand in and uncoordinated manner.  Pt able to squeeze the cloth with both hands, standing without UE assist for 1-2 sec.                           ADL either performed or assessed with clinical judgement   ADL Overall ADL's : Needs assistance/impaired     Grooming: Brushing hair;Wash/dry face;Moderate assistance;Standing Grooming Details (indicate cue type and reason): Pt performing grooming tasks at sink with Mod A for standign balance and stability. Pt with significant pain in Bil feet. Motivated to continue participating in grooming. Reliant on UE support on counter. Demonstrating poor grasp strength and ROM. demonstrating poor wrist extention in RUE as seen by turning faucet off by pushing with the back of his hand. Staning  for ~5-7 minutes             Lower Body Dressing: Minimal assistance;Sit to/from stand Lower Body Dressing Details (indicate cue type and reason): Pt donning slippers with Min verbal cues for  problem solving. Able to bring right ankle to left knee. Min A to right shoe don over toes. Pt bending forward to adjust shoe on left foot Toilet Transfer: Moderate assistance;+2 for physical assistance;Ambulation;Cueing for sequencing;Cueing for safety;RW(Simulated to recliner)           Functional mobility during ADLs: Moderate assistance;+2 for physical assistance;Rolling walker;Cueing for sequencing;Cueing for safety General ADL Comments: Pt highly motivated to participate in therapy.      Vision   Additional Comments: Will continue to assess   Perception     Praxis      Cognition Arousal/Alertness: Awake/alert Behavior During Therapy: Flat affect Overall Cognitive Status: No family/caregiver present to determine baseline cognitive functioning Area of Impairment: Attention;Following commands;Safety/judgement;Awareness                   Current Attention Level: Selective   Following Commands: Follows one step commands with increased time Safety/Judgement: Decreased awareness of safety Awareness: Emergent   General Comments: During self feeding, pt demosntrating decreased problem solving. Required Max cues to adapt and optimize sequencing. For example, pt scooping apple sauce with RUE and apple sauce cup sliding around table. Providing cues to stablize cup with LUE.         Exercises Exercises:  Hand Exercises Digit Lifts: AROM;Both;5 reps;Seated(No finger extention in R hand; isolated or composite) Opposition: AROM;Both;5 reps;Seated(limited to 1st-4th finger of R hand)   Shoulder Instructions       General Comments vss    Pertinent Vitals/ Pain       Pain Assessment: Faces Faces Pain Scale: Hurts little more Pain Location: L knee Pain Descriptors / Indicators: Grimacing;Guarding Pain Intervention(s): Monitored during session  Home Living                                          Prior Functioning/Environment               Frequency  Min 2X/week        Progress Toward Goals  OT Goals(current goals can now be found in the care plan section)  Progress towards OT goals: Progressing toward goals  Acute Rehab OT Goals Patient Stated Goal: none stated OT Goal Formulation: With patient Time For Goal Achievement: 08/26/17 Potential to Achieve Goals: Good ADL Goals Pt Will Perform Eating: with modified independence;with adaptive utensils;sitting Pt Will Perform Grooming: with min assist;sitting(no more than 2 VC) Pt Will Transfer to Toilet: with min assist;stand pivot transfer Pt Will Perform Toileting - Clothing Manipulation and hygiene: with min assist;sit to/from stand Additional ADL Goal #1: Pt will demonstrate emergent awareness during seated grooming tasks. Additional ADL Goal #2: Pt will sustain attention during 5 minute seated ADL task with no more than 1 VC in a non-distracting environment.  Plan Discharge plan needs to be updated    Co-evaluation    PT/OT/SLP Co-Evaluation/Treatment: Yes Reason for Co-Treatment: For patient/therapist safety;To address functional/ADL transfers   OT goals addressed during session: ADL's and self-care      AM-PAC PT "6 Clicks" Daily Activity     Outcome Measure   Help from another person eating meals?: A Lot Help from another person  taking care of personal grooming?: A Lot Help from another person toileting, which includes using toliet, bedpan, or urinal?: A Lot Help from another person bathing (including washing, rinsing, drying)?: Total Help from another person to put on and taking off regular upper body clothing?: Total Help from another person to put on and taking off regular lower body clothing?: A Lot 6 Click Score: 10    End of Session Equipment Utilized During Treatment: Gait belt;Rolling walker  OT Visit Diagnosis: Other abnormalities of gait and mobility (R26.89);Other symptoms and signs involving cognitive function;Pain Pain - part of  body: (back)   Activity Tolerance Patient tolerated treatment well   Patient Left with call bell/phone within reach;in chair;with chair alarm set;with family/visitor present;Other (comment)(with LLE elevated to allow knee extention)   Nurse Communication Mobility status        Time: 4098-1191 OT Time Calculation (min): 49 min  Charges: OT General Charges $OT Visit: 1 Visit OT Treatments $Self Care/Home Management : 8-22 mins  Sylina Henion MSOT, OTR/L Acute Rehab Pager: [phone] Office: [phone]   Theodoro Grist Brain Honeycutt 08/18/2017, 5:49 PM

## 2017-08-18 NOTE — Progress Notes (Signed)
      [address]       [place] [id number]             [phone]     CARDIOTHORACIC SURGERY PROGRESS NOTE  4 Days Post-Op  S/P Procedure(s) (LRB): TRANSESOPHAGEAL ECHOCARDIOGRAM (TEE) (N/A)  Subjective: Reports that breathing is much improved.  Still having a lot of pain in back, worrisome for possible diskitis.  Still very weak and unable to walk at all.  Objective: Vital signs in last 24 hours: Temp:  [98.1 F (36.7 C)-98.7 F (37.1 C)] 98.7 F (37.1 C) (03/18 0445) Pulse Rate:  [70-91] 70 (03/18 0445) Cardiac Rhythm: Normal sinus rhythm (03/18 0701) Resp:  [14-21] 17 (03/18 0445) BP: (91-105)/(66-68) 105/68 (03/18 0445) SpO2:  [98 %-99 %] 98 % (03/18 0445) Weight:  [125 lb 6.4 oz (56.9 kg)] 125 lb 6.4 oz (56.9 kg) (03/18 0445)  Physical Exam:  Rhythm:   sinus  Breath sounds: Diminished at bases, otherwise clear  Heart sounds:  RRR w/ holosystolic murmur  Incisions:  n/a  Abdomen:  Soft, non-distended, non-tender  Extremities:  Warm, well-perfused, mild edema   Intake/Output from previous day: 03/17 0701 - 03/18 0700 In: 1437 [P.O.:1037; IV Piggyback:400] Out: 1350 [Urine:1350] Intake/Output this shift: No intake/output data recorded.  Lab Results: Recent Labs    08/17/17 0504 08/18/17 0605  WBC 13.4* 13.2*  HGB 10.3* 10.9*  HCT 32.5* 33.1*  PLT 305 330   BMET:  Recent Labs    08/17/17 0504 08/18/17 0605  NA 131* 130*  K 3.9 3.8  CL 94* 91*  CO2 27 28  GLUCOSE 104* 94  BUN 21* 19  CREATININE 1.20 1.15  CALCIUM 8.3* 8.6*    CBG (last 3)  No results for input(s): GLUCAP in the last 72 hours. PT/INR:  No results for input(s): LABPROT, INR in the last 72 hours.  CXR:  N/A  Assessment/Plan: S/P Procedure(s) (LRB): TRANSESOPHAGEAL ECHOCARDIOGRAM (TEE) (N/A)  CHF and respiratory status improved MRI spine and repeat MRI head pending All cultures negative to date.   Hasn't been seen in f/u by ID team Remains extremely  malnourished and deconditioned For L+R cath tomorrow, dental extraction later this week Discussed matters with patient, no family in for the past few days Will continue to follow intermittently  I spent in excess of 15 minutes during the conduct of this hospital encounter and >50% of this time involved direct face-to-face encounter with the patient for counseling and/or coordination of their care.   Purcell Nailslarence H Annet Manukyan, MD 08/18/2017 9:16 AM

## 2017-08-18 NOTE — Plan of Care (Signed)
  Pain Managment: General experience of comfort will improve 08/18/2017 2309 - Progressing by Murlean HarkHinckley, Christell Steinmiller, RN  Pt verbalizes reduced pain with PRN pain medications.

## 2017-08-18 NOTE — H&P (View-Only) (Signed)
Progress Note  Patient Name: Marcus [name] Date of Encounter: 08/18/2017  Primary Cardiologist: Armanda Magic, MD   Subjective   Denies CP or dyspnea; patient complains of pain in back at recent thoracentesis site.  Inpatient Medications    Scheduled Meds: . enoxaparin (LOVENOX) injection  40 mg Subcutaneous Q24H  . feeding supplement (ENSURE ENLIVE)  237 mL Oral TID BM  . folic acid  1 mg Oral Daily  . furosemide  40 mg Intravenous BID  . LORazepam  1 mg Intravenous Once  . multivitamin with minerals  1 tablet Oral Daily  . mupirocin ointment   Nasal BID  . oxyCODONE-acetaminophen  1 tablet Oral Q6H  . thiamine  100 mg Oral Daily   Or  . thiamine  100 mg Intravenous Daily   Continuous Infusions: .  ceFAZolin (ANCEF) IV Stopped (08/18/17 0259)   PRN Meds: acetaminophen, albuterol, fentaNYL (SUBLIMAZE) injection   Vital Signs    Vitals:   08/17/17 1927 08/17/17 1950 08/17/17 2000 08/18/17 0445  BP: 91/66   105/68  Pulse: 81   70  Resp: 14 17 (!) 21 17  Temp: 98.1 F (36.7 C)   98.7 F (37.1 C)  TempSrc: Oral   Axillary  SpO2: 99%   98%  Weight:    125 lb 6.4 oz (56.9 kg)  Height:        Intake/Output Summary (Last 24 hours) at 08/18/2017 0734 Last data filed at 08/18/2017 0447 Gross per 24 hour  Intake 1437 ml  Output 1350 ml  Net 87 ml   Filed Weights   08/16/17 0405 08/17/17 0424 08/18/17 0445  Weight: 128 lb 8.5 oz (58.3 kg) 125 lb 4.8 oz (56.8 kg) 125 lb 6.4 oz (56.9 kg)    Telemetry    NSR- Personally Reviewed   Physical Exam   GEN: No acute distress. Frail  Neck: No JVD Cardiac: RRR, 3/6 systolic murmur LSB Respiratory: Diminished BS RLL GI: Soft, nontender, non-distended  MS: No edema; mild tenderness left knee Neuro:  Nonfocal  Psych: Normal affect   Labs    Chemistry Recent Labs  Lab 08/14/17 0531 08/15/17 1054 08/16/17 0357 08/17/17 0504 08/18/17 0605  NA 136 136 132* 131* 130*  K 3.9 4.2 3.9 3.9 3.8  CL 99* 101 95* 94*  91*  CO2 28 26 26 27 28   GLUCOSE 98 124* 95 104* 94  BUN 16 14 15  21* 19  CREATININE 1.07 1.16 1.15 1.20 1.15  CALCIUM 8.4* 8.5* 8.4* 8.3* 8.6*  PROT 6.8 6.7 6.8  --   --   ALBUMIN 2.2* 2.2* 2.3*  --   --   AST 16 22 20   --   --   ALT <5* <5* <5*  --   --   ALKPHOS 96 88 101  --   --   BILITOT 0.7 0.5 0.4  --   --   GFRNONAA >60 >60 >60 >60 >60  GFRAA >60 >60 >60 >60 >60  ANIONGAP 9 9 11 10 11      Hematology Recent Labs  Lab 08/16/17 0357 08/17/17 0504 08/18/17 0605  WBC 9.1 13.4* 13.2*  RBC 3.63* 3.66* 3.70*  HGB 10.2* 10.3* 10.9*  HCT 32.9* 32.5* 33.1*  MCV 90.6 88.8 89.5  MCH 28.1 28.1 29.5  MCHC 31.0 31.7 32.9  RDW 14.6 14.4 14.7  PLT 289 305 330    Cardiac Enzymes Recent Labs  Lab 08/11/17 0913  TROPONINI <0.03    Patient Profile  52 y.o. 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  Patient continues to improve.  Continue antibiotics.  Infectious disease is following.  Patient was unable to cooperate with brain MRI.  Follow-up study under anesthesia has been ordered with additional imaging of the thoracic and lumbar spine to R/O discitis.  Patient has had dental consult and teeth extraction planned for later this week.  Will arrange right and left cardiac catheterization tomorrow.  The risks and benefits including myocardial infarction, CVA and death discussed and he agrees to proceed.  Patient has been seen by Dr. Cornelius Moraswen with possible plans for mitral valve replacement after he improves.  2 acute diastolic congestive heart failure-patient has improved since admission.  Continue present dose of Lasix and follow renal function.  Plan right heart catheterization tomorrow.  3 severe mitral regurgitation-plan as outlined above.  4 severe malnutrition-patient has had a  nutrition consult.  Overall improving.  5 substance abuse-patient counseled on avoiding.  For questions or updates, please contact [facility] Please consult [email or web address] for contact info under Cardiology/STEMI.      Signed, Olga MillersBrian Crenshaw, MD  08/18/2017, 7:34 AM

## 2017-08-18 NOTE — Progress Notes (Signed)
Patient ID: Marcus [name], male   DOB: [birth year], 52 y.o.   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  He completed 6 weeks of IV antibiotic therapy but was readmitted with shortness of breath due to a combination of severe mitral regurgitation, probable healthcare associated pneumonia and parapneumonic right pleural effusion.  Blood and pleural fluid cultures are negative but I would certainly agree with continuing cefazolin for possible persistent MSSA infection.  PLAN: 1. Continue cefazolin  Principal Problem:   Severe mitral regurgitation Active Problems:   Endocarditis of mitral valve   Acute heart failure (HCC)   HAP (hospital-acquired pneumonia)   Thoracic discitis   Hepatitis C antibody positive in blood   Sepsis (HCC)   Pleural effusion   Protein-calorie malnutrition, severe   Scheduled Meds: . enoxaparin (LOVENOX) injection  40 mg Subcutaneous Q24H  . feeding supplement (ENSURE ENLIVE)  237 mL Oral TID BM  . folic acid  1 mg Oral Daily  . furosemide  40 mg Intravenous BID  . LORazepam  1 mg Intravenous Once  . multivitamin with minerals  1 tablet Oral Daily  . mupirocin ointment   Nasal BID  . oxyCODONE-acetaminophen  1 tablet Oral Q6H  . thiamine  100 mg Oral Daily   Or  . thiamine  100 mg Intravenous Daily   Continuous Infusions: .  ceFAZolin (ANCEF) IV 2 g (08/18/17 0918)   PRN Meds:.acetaminophen, albuterol   SUBJECTIVE: He says that he is feeling much better than he was upon admission.  He is not nearly as short of breath.  He is having severe mid back pain.  He tells me that the pain began after he left the hospital in January.  He says that he stayed in the skilled  nursing facility and completed 6 weeks of IV antibiotics.  Review of Systems: Review of Systems  Constitutional: Positive for malaise/fatigue and weight loss. Negative for chills, diaphoresis and fever.  HENT: Negative for congestion and sore throat.   Respiratory: Positive for shortness of breath. Negative for cough and sputum production.   Cardiovascular: Negative for chest pain.  Gastrointestinal: Negative for abdominal pain, nausea and vomiting.  Genitourinary: Negative for dysuria.  Musculoskeletal: Positive for back pain. Negative for joint pain.  Skin: Negative for rash.  Neurological: Negative for dizziness and headaches.    No Known Allergies  OBJECTIVE: Vitals:   08/17/17 1927 08/17/17 1950 08/17/17 2000 08/18/17 0445  BP: 91/66   105/68  Pulse: 81   70  Resp: 14 17 (!) 21 17  Temp: 98.1 F (36.7 C)   98.7 F (37.1 C)  TempSrc: Oral   Axillary  SpO2: 99%   98%  Weight:    125 lb 6.4 oz (56.9 kg)  Height:       Body mass index is 17.49 kg/m.  Physical Exam  Constitutional: He is oriented to person, place, and time.  He is cachectic.  He has much more alert and conversant than when I last saw him in mid January.  He is sitting up in bed.  Cardiovascular: Normal rate and regular rhythm.  Murmur  heard. 2/6 systolic murmur.  Pulmonary/Chest: Effort normal. He has no wheezes. He has no rales.  Slight kyphotic deformity and mid thoracic spine.  Abdominal: Soft. There is no tenderness. There is no rebound.  Neurological: He is alert and oriented to person, place, and time.  Skin: No rash noted.  Psychiatric: Mood and affect normal.    Lab Results Lab Results  Component Value Date   WBC 13.2 (H) 08/18/2017   HGB 10.9 (L) 08/18/2017   HCT 33.1 (L) 08/18/2017   MCV 89.5 08/18/2017   PLT 330 08/18/2017    Lab Results  Component Value Date   CREATININE 1.15 08/18/2017   BUN 19 08/18/2017   NA 130 (L) 08/18/2017   K 3.8 08/18/2017   CL 91 (L) 08/18/2017    CO2 28 08/18/2017    Lab Results  Component Value Date   ALT <5 (L) 08/16/2017   AST 20 08/16/2017   ALKPHOS 101 08/16/2017   BILITOT 0.4 08/16/2017     Microbiology: Recent Results (from the past 240 hour(s))  Culture, blood (Routine x 2)     Status: None   Collection Time: 08/09/17  2:25 PM  Result Value Ref Range Status   Specimen Description BLOOD LEFT WRIST  Final   Special Requests IN PEDIATRIC BOTTLE Blood Culture adequate volume  Final   Culture   Final    NO GROWTH 5 DAYS Performed at [facility], [address] [place], [place] [address]    Report Status 08/14/2017 FINAL  Final  Culture, blood (Routine x 2)     Status: None   Collection Time: 08/09/17  2:48 PM  Result Value Ref Range Status   Specimen Description BLOOD RIGHT HAND  Final   Special Requests IN PEDIATRIC BOTTLE Blood Culture adequate volume  Final   Culture   Final    NO GROWTH 5 DAYS Performed at [facility], [address] [place], [place] [address]    Report Status 08/14/2017 FINAL  Final  Urine culture     Status: Abnormal   Collection Time: 08/09/17  3:50 PM  Result Value Ref Range Status   Specimen Description URINE, CLEAN CATCH  Final   Special Requests   Final    NONE Performed at [facility], [address] [place], [place] [address]    Culture MULTIPLE SPECIES PRESENT, SUGGEST RECOLLECTION (A)  Final   Report Status 08/10/2017 FINAL  Final  Respiratory Panel by PCR     Status: None   Collection Time: 08/09/17  4:00 PM  Result Value Ref Range Status   Adenovirus NOT DETECTED NOT DETECTED Final   Coronavirus 229E NOT DETECTED NOT DETECTED Final   Coronavirus HKU1 NOT DETECTED NOT DETECTED Final   Coronavirus NL63 NOT DETECTED NOT DETECTED Final   Coronavirus OC43 NOT DETECTED NOT DETECTED Final   Metapneumovirus NOT DETECTED NOT DETECTED Final   Rhinovirus / Enterovirus NOT DETECTED NOT DETECTED Final   Influenza A NOT DETECTED NOT DETECTED Final   Influenza  B NOT DETECTED NOT DETECTED Final   Parainfluenza Virus 1 NOT DETECTED NOT DETECTED Final   Parainfluenza Virus 2 NOT DETECTED NOT DETECTED Final   Parainfluenza Virus 3 NOT DETECTED NOT DETECTED Final   Parainfluenza Virus 4 NOT DETECTED NOT DETECTED Final   Respiratory Syncytial Virus NOT DETECTED NOT DETECTED Final   Bordetella pertussis NOT DETECTED NOT DETECTED Final   Chlamydophila pneumoniae NOT DETECTED NOT DETECTED Final   Mycoplasma  pneumoniae NOT DETECTED NOT DETECTED Final    Comment: Performed at [facility], [address] [place], [place] [address]  Culture, blood (routine x 2) Call MD if unable to obtain prior to antibiotics being given     Status: None   Collection Time: 08/10/17  1:10 AM  Result Value Ref Range Status   Specimen Description BLOOD RIGHT HAND  Final   Special Requests IN PEDIATRIC BOTTLE Blood Culture adequate volume  Final   Culture   Final    NO GROWTH 5 DAYS Performed at [facility], [address] [place], [place] [address]    Report Status 08/15/2017 FINAL  Final  Culture, blood (routine x 2) Call MD if unable to obtain prior to antibiotics being given     Status: None   Collection Time: 08/10/17  1:20 AM  Result Value Ref Range Status   Specimen Description BLOOD RIGHT ARM  Final   Special Requests   Final    BOTTLES DRAWN AEROBIC AND ANAEROBIC Blood Culture adequate volume   Culture   Final    NO GROWTH 5 DAYS Performed at [facility], [address] [place], [place] [address]    Report Status 08/15/2017 FINAL  Final  Fungus Culture With Stain     Status: None (Preliminary result)   Collection Time: 08/10/17 12:59 PM  Result Value Ref Range Status   Fungus Stain Final report  Final    Comment: (NOTE) Performed At: [facility] [address] [place], [place] [id number] Jolene Schimke MD [id number]    Fungus (Mycology) Culture PENDING  Incomplete   Fungal Source PLEURAL  Final    Comment:  RIGHT Performed at [facility], [address] [place], [place] [address]   Acid Fast Smear (AFB)     Status: None   Collection Time: 08/10/17 12:59 PM  Result Value Ref Range Status   AFB Specimen Processing Concentration  Final   Acid Fast Smear Negative  Final    Comment: (NOTE) Performed At: [facility] [address] [place], [place] [id number] Jolene Schimke MD YT:[id number]    Source (AFB) PLEURAL  Final    Comment: RIGHT Performed at [facility], [address], [place], [place] [address]   Culture, body fluid-bottle     Status: None   Collection Time: 08/10/17 12:59 PM  Result Value Ref Range Status   Specimen Description PLEURAL RIGHT  Final   Special Requests NONE  Final   Culture   Final    NO GROWTH 5 DAYS Performed at [facility], [address], [place], [place] [address]    Report Status 08/15/2017 FINAL  Final  Gram stain     Status: None   Collection Time: 08/10/17 12:59 PM  Result Value Ref Range Status   Specimen Description PLEURAL RIGHT  Final   Special Requests NONE  Final   Gram Stain   Final    FEW WBC PRESENT,BOTH PMN AND MONONUCLEAR NO ORGANISMS SEEN Performed at [facility], [address] [place], [place] [address]    Report Status 08/10/2017 FINAL  Final  Fungus Culture Result     Status: None   Collection Time: 08/10/17 12:59 PM  Result Value Ref Range Status   Result 1 Comment  Final    Comment: (NOTE) KOH/Calcofluor preparation:  no fungus observed. Performed At: [facility] [address] [place], [place] [id number] Jolene Schimke MD  NW:[id number] Performed at [facility], [address], [place], [place] [address]   MRSA PCR Screening     Status: Abnormal   Collection Time: 08/11/17  6:18 AM  Result Value Ref Range Status   MRSA by PCR POSITIVE (A) NEGATIVE Final    Comment:        The GeneXpert MRSA Assay (FDA approved for NASAL specimens only), is one component of  a comprehensive MRSA colonization surveillance program. It is not intended to diagnose MRSA infection nor to guide or monitor treatment for MRSA infections. RESULT CALLED TO, READ BACK BY AND VERIFIED WITH: RN Osa Craver [id number] [phone] MLM Performed at [facility], [address], [place], [place] [address]   Culture, blood (routine x 2)     Status: None (Preliminary result)   Collection Time: 08/15/17 10:54 AM  Result Value Ref Range Status   Specimen Description BLOOD RIGHT HAND  Final   Special Requests   Final    BOTTLES DRAWN AEROBIC AND ANAEROBIC Blood Culture results may not be optimal due to an inadequate volume of blood received in culture bottles   Culture   Final    NO GROWTH 2 DAYS Performed at [facility], [address] [place], [place] [address]    Report Status PENDING  Incomplete  Culture, blood (routine x 2)     Status: None (Preliminary result)   Collection Time: 08/15/17 10:54 AM  Result Value Ref Range Status   Specimen Description BLOOD LEFT HAND  Final   Special Requests   Final    BOTTLES DRAWN AEROBIC AND ANAEROBIC Blood Culture adequate volume   Culture   Final    NO GROWTH 2 DAYS Performed at [facility], [address], [place], [place] [address]    Report Status PENDING  Incomplete    Cliffton Asters, MD [facility] for Infectious Disease [facility] [phone] pager   [phone] cell 08/18/2017, 11:06 AM

## 2017-08-19 ENCOUNTER — Encounter (HOSPITAL_COMMUNITY)
Admission: EM | Disposition: A | Discharge status: Home Health | Payer: Self-pay | Source: Home / Self Care | Attending: Family Medicine

## 2017-08-19 ENCOUNTER — Inpatient Hospital Stay (HOSPITAL_COMMUNITY): Payer: Medicaid Other | Admitting: Certified Registered Nurse Anesthetist

## 2017-08-19 ENCOUNTER — Inpatient Hospital Stay (HOSPITAL_COMMUNITY): Payer: Medicaid Other

## 2017-08-19 ENCOUNTER — Encounter (HOSPITAL_COMMUNITY): Payer: Self-pay | Admitting: Interventional Cardiology

## 2017-08-19 DIAGNOSIS — D72829 Elevated white blood cell count, unspecified: Secondary | ICD-10-CM

## 2017-08-19 HISTORY — PX: RADIOLOGY WITH ANESTHESIA: SHX6223

## 2017-08-19 HISTORY — PX: RIGHT/LEFT HEART CATH AND CORONARY ANGIOGRAPHY: CATH118266

## 2017-08-19 LAB — POCT I-STAT 3, VENOUS BLOOD GAS (G3P V)
Acid-Base Excess: 7 mmol/L — ABNORMAL HIGH (ref 0.0–2.0)
Bicarbonate: 32.8 mmol/L — ABNORMAL HIGH (ref 20.0–28.0)
O2 SAT: 61 %
TCO2: 34 mmol/L — AB (ref 22–32)
pCO2, Ven: 52.2 mmHg (ref 44.0–60.0)
pH, Ven: 7.406 (ref 7.250–7.430)
pO2, Ven: 32 mmHg (ref 32.0–45.0)

## 2017-08-19 LAB — BASIC METABOLIC PANEL
Anion gap: 13 (ref 5–15)
BUN: 22 mg/dL — AB (ref 6–20)
CALCIUM: 8.4 mg/dL — AB (ref 8.9–10.3)
CO2: 27 mmol/L (ref 22–32)
Chloride: 88 mmol/L — ABNORMAL LOW (ref 101–111)
Creatinine, Ser: 1.23 mg/dL (ref 0.61–1.24)
GFR calc Af Amer: >60 mL/min (ref >60)
GLUCOSE: 89 mg/dL (ref 65–99)
Potassium: 3.2 mmol/L — ABNORMAL LOW (ref 3.5–5.1)
Sodium: 128 mmol/L — ABNORMAL LOW (ref 135–145)

## 2017-08-19 LAB — POCT I-STAT 3, ART BLOOD GAS (G3+)
ACID-BASE EXCESS: 7 mmol/L — AB (ref 0.0–2.0)
Bicarbonate: 32 mmol/L — ABNORMAL HIGH (ref 20.0–28.0)
O2 Saturation: 96 %
PO2 ART: 83 mmHg (ref 83.0–108.0)
TCO2: 33 mmol/L — ABNORMAL HIGH (ref 22–32)
pCO2 arterial: 46.6 mmHg (ref 32.0–48.0)
pH, Arterial: 7.445 (ref 7.350–7.450)

## 2017-08-19 LAB — PROTIME-INR
INR: 1.1
PROTHROMBIN TIME: 14.1 s (ref 11.4–15.2)

## 2017-08-19 LAB — CBC
HCT: 30.4 % — ABNORMAL LOW (ref 39.0–52.0)
HCT: 29.8 % — ABNORMAL LOW (ref 39.0–52.0)
Hemoglobin: 9.6 g/dL — ABNORMAL LOW (ref 13.0–17.0)
Hemoglobin: 9.5 g/dL — ABNORMAL LOW (ref 13.0–17.0)
MCH: 27.7 pg (ref 26.0–34.0)
MCH: 28.4 pg (ref 26.0–34.0)
MCHC: 31.6 g/dL (ref 30.0–36.0)
MCHC: 31.9 g/dL (ref 30.0–36.0)
MCV: 87.6 fL (ref 78.0–100.0)
MCV: 89.2 fL (ref 78.0–100.0)
PLATELETS: 265 10*3/uL (ref 150–400)
Platelets: 321 10*3/uL (ref 150–400)
RBC: 3.47 MIL/uL — ABNORMAL LOW (ref 4.22–5.81)
RBC: 3.34 MIL/uL — AB (ref 4.22–5.81)
RDW: 14.4 % (ref 11.5–15.5)
RDW: 14.6 % (ref 11.5–15.5)
WBC: 15.9 10*3/uL — ABNORMAL HIGH (ref 4.0–10.5)
WBC: 14.3 10*3/uL — AB (ref 4.0–10.5)

## 2017-08-19 LAB — DIFFERENTIAL
BASOS ABS: 0 10*3/uL (ref 0.0–0.1)
Basophils Relative: 0 %
Eosinophils Absolute: 0.3 10*3/uL (ref 0.0–0.7)
Eosinophils Relative: 2 %
LYMPHS PCT: 20 %
Lymphs Abs: 3.1 10*3/uL (ref 0.7–4.0)
Monocytes Absolute: 0.8 10*3/uL (ref 0.1–1.0)
Monocytes Relative: 5 %
NEUTROS ABS: 11.5 10*3/uL — AB (ref 1.7–7.7)
NEUTROS PCT: 73 %

## 2017-08-19 LAB — CREATININE, SERUM
Creatinine, Ser: 1.1 mg/dL (ref 0.61–1.24)
GFR calc non Af Amer: >60 mL/min (ref >60)

## 2017-08-19 SURGERY — MRI WITH ANESTHESIA
Anesthesia: General

## 2017-08-19 SURGERY — RIGHT/LEFT HEART CATH AND CORONARY ANGIOGRAPHY
Anesthesia: LOCAL

## 2017-08-19 MED ORDER — POTASSIUM CHLORIDE CRYS ER 20 MEQ PO TBCR
40.0000 meq | EXTENDED_RELEASE_TABLET | Freq: Two times a day (BID) | ORAL | Status: AC
  Administered 2017-08-19 (×2): 40 meq via ORAL
  Filled 2017-08-19 (×2): qty 2

## 2017-08-19 MED ORDER — MIDAZOLAM HCL 2 MG/2ML IJ SOLN
INTRAMUSCULAR | Status: AC
  Filled 2017-08-19: qty 2

## 2017-08-19 MED ORDER — ONDANSETRON HCL 4 MG/2ML IJ SOLN
4.0000 mg | Freq: Four times a day (QID) | INTRAMUSCULAR | Status: DC | PRN
  Administered 2017-08-19 – 2017-08-20 (×2): 4 mg via INTRAVENOUS
  Filled 2017-08-19: qty 2

## 2017-08-19 MED ORDER — ONDANSETRON HCL 4 MG/2ML IJ SOLN: 4.0000 mg | Freq: Once | INTRAMUSCULAR | Status: DC | PRN

## 2017-08-19 MED ORDER — SODIUM CHLORIDE 0.9% FLUSH: 3.0000 mL | INTRAVENOUS | Status: DC | PRN

## 2017-08-19 MED ORDER — ACETAMINOPHEN 325 MG PO TABS: 325.0000 mg | ORAL_TABLET | ORAL | Status: DC | PRN

## 2017-08-19 MED ORDER — KETOROLAC TROMETHAMINE 30 MG/ML IJ SOLN: 30.0000 mg | Freq: Once | INTRAMUSCULAR | Status: DC | PRN

## 2017-08-19 MED ORDER — IOPAMIDOL (ISOVUE-370) INJECTION 76%
INTRAVENOUS | Status: DC | PRN
Start: 2017-08-19 — End: 2017-08-19
  Administered 2017-08-19: 60 mL

## 2017-08-19 MED ORDER — VERAPAMIL HCL 2.5 MG/ML IV SOLN
INTRAVENOUS | Status: AC
  Filled 2017-08-19: qty 2

## 2017-08-19 MED ORDER — SODIUM CHLORIDE 0.9 % IV SOLN
250.0000 mL | INTRAVENOUS | Status: DC | PRN
  Administered 2017-08-19: 11:00:00 via INTRAVENOUS

## 2017-08-19 MED ORDER — LIDOCAINE HCL (PF) 1 % IJ SOLN
INTRAMUSCULAR | Status: DC | PRN
  Administered 2017-08-19 (×2): 2 mL

## 2017-08-19 MED ORDER — LIDOCAINE HCL (PF) 1 % IJ SOLN
INTRAMUSCULAR | Status: AC
  Filled 2017-08-19: qty 30

## 2017-08-19 MED ORDER — MEPERIDINE HCL 25 MG/ML IJ SOLN: 6.2500 mg | INTRAMUSCULAR | Status: DC | PRN

## 2017-08-19 MED ORDER — FENTANYL CITRATE (PF) 100 MCG/2ML IJ SOLN
INTRAMUSCULAR | Status: AC
  Filled 2017-08-19: qty 2

## 2017-08-19 MED ORDER — HEPARIN (PORCINE) IN NACL 2-0.9 UNIT/ML-% IJ SOLN
INTRAMUSCULAR | Status: AC
  Filled 2017-08-19: qty 1000

## 2017-08-19 MED ORDER — HEPARIN (PORCINE) IN NACL 2-0.9 UNIT/ML-% IJ SOLN
INTRAMUSCULAR | Status: AC | PRN
  Administered 2017-08-19 (×2): 500 mL

## 2017-08-19 MED ORDER — OXYCODONE HCL 5 MG/5ML PO SOLN: 5.0000 mg | Freq: Once | ORAL | Status: DC | PRN

## 2017-08-19 MED ORDER — POTASSIUM CHLORIDE CRYS ER 20 MEQ PO TBCR: 40.0000 meq | EXTENDED_RELEASE_TABLET | ORAL | Status: DC

## 2017-08-19 MED ORDER — GADOBENATE DIMEGLUMINE 529 MG/ML IV SOLN
9.0000 mL | Freq: Once | INTRAVENOUS | Status: AC | PRN
  Administered 2017-08-19: 9 mL via INTRAVENOUS

## 2017-08-19 MED ORDER — VERAPAMIL HCL 2.5 MG/ML IV SOLN
INTRAVENOUS | Status: DC | PRN
  Administered 2017-08-19: 10 mL via INTRA_ARTERIAL

## 2017-08-19 MED ORDER — FENTANYL CITRATE (PF) 100 MCG/2ML IJ SOLN
INTRAMUSCULAR | Status: DC | PRN
  Administered 2017-08-19 (×2): 25 ug via INTRAVENOUS

## 2017-08-19 MED ORDER — MIDAZOLAM HCL 2 MG/2ML IJ SOLN
INTRAMUSCULAR | Status: DC | PRN
  Administered 2017-08-19 (×2): 1 mg via INTRAVENOUS

## 2017-08-19 MED ORDER — FENTANYL CITRATE (PF) 100 MCG/2ML IJ SOLN: 25.0000 ug | INTRAMUSCULAR | Status: DC | PRN

## 2017-08-19 MED ORDER — KETAMINE HCL 10 MG/ML IJ SOLN
INTRAMUSCULAR | Status: AC
  Filled 2017-08-19: qty 1

## 2017-08-19 MED ORDER — ENOXAPARIN SODIUM 40 MG/0.4ML ~~LOC~~ SOLN: 40.0000 mg | SUBCUTANEOUS | Status: DC

## 2017-08-19 MED ORDER — OXYCODONE HCL 5 MG PO TABS: 5.0000 mg | ORAL_TABLET | Freq: Once | ORAL | Status: DC | PRN

## 2017-08-19 MED ORDER — SODIUM CHLORIDE 0.9% FLUSH
3.0000 mL | Freq: Two times a day (BID) | INTRAVENOUS | Status: DC
  Administered 2017-08-19 – 2017-08-22 (×6): 3 mL via INTRAVENOUS

## 2017-08-19 MED ORDER — SODIUM CHLORIDE 0.9 % IV SOLN: INTRAVENOUS | Status: AC

## 2017-08-19 MED ORDER — ACETAMINOPHEN 160 MG/5ML PO SOLN: 325.0000 mg | ORAL | Status: DC | PRN

## 2017-08-19 MED ORDER — IOPAMIDOL (ISOVUE-370) INJECTION 76%
INTRAVENOUS | Status: AC
  Filled 2017-08-19: qty 100

## 2017-08-19 MED ORDER — HEPARIN SODIUM (PORCINE) 1000 UNIT/ML IJ SOLN
INTRAMUSCULAR | Status: AC
  Filled 2017-08-19: qty 1

## 2017-08-19 MED ORDER — LIDOCAINE HCL 1 % IJ SOLN
INTRAMUSCULAR | Status: DC | PRN
  Administered 2017-08-19: 60 mg via INTRADERMAL

## 2017-08-19 MED ORDER — SUCCINYLCHOLINE CHLORIDE 20 MG/ML IJ SOLN
INTRAMUSCULAR | Status: DC | PRN
  Administered 2017-08-19: 100 mg via INTRAVENOUS

## 2017-08-19 MED ORDER — PROPOFOL 10 MG/ML IV BOLUS
INTRAVENOUS | Status: DC | PRN
Start: 2017-08-19 — End: 2017-08-19
  Administered 2017-08-19: 20 mg via INTRAVENOUS
  Administered 2017-08-19: 100 mg via INTRAVENOUS

## 2017-08-19 MED ORDER — KETAMINE HCL 10 MG/ML IJ SOLN
INTRAMUSCULAR | Status: DC | PRN
  Administered 2017-08-19 (×5): 10 mg via INTRAVENOUS
  Administered 2017-08-19: 50 mg via INTRAVENOUS

## 2017-08-19 MED ORDER — MIDAZOLAM HCL 2 MG/2ML IJ SOLN
INTRAMUSCULAR | Status: DC | PRN
  Administered 2017-08-19: 2 mg via INTRAVENOUS

## 2017-08-19 MED ORDER — HEPARIN SODIUM (PORCINE) 1000 UNIT/ML IJ SOLN
INTRAMUSCULAR | Status: DC | PRN
Start: 2017-08-19 — End: 2017-08-19
  Administered 2017-08-19: 3000 [IU] via INTRAVENOUS

## 2017-08-19 MED ORDER — ACETAMINOPHEN 325 MG PO TABS: 650.0000 mg | ORAL_TABLET | ORAL | Status: DC | PRN

## 2017-08-19 SURGICAL SUPPLY — 15 items
BAND CMPR LRG ZPHR (HEMOSTASIS) ×1
BAND ZEPHYR COMPRESS 30 LONG (HEMOSTASIS) ×1 IMPLANT
CATH BALLN WEDGE 5F 110CM (CATHETERS) ×1 IMPLANT
CATH INFINITI 5 FR JL3.5 (CATHETERS) ×1 IMPLANT
CATH INFINITI JR4 5F (CATHETERS) ×1 IMPLANT
GUIDEWIRE INQWIRE 1.5J.035X260 (WIRE) IMPLANT
INQWIRE 1.5J .035X260CM (WIRE) ×2
KIT HEART LEFT (KITS) ×2 IMPLANT
NDL PERC 21GX4CM (NEEDLE) IMPLANT
NEEDLE PERC 21GX4CM (NEEDLE) ×2 IMPLANT
PACK CARDIAC CATHETERIZATION (CUSTOM PROCEDURE TRAY) ×2 IMPLANT
SHEATH GLIDE SLENDER 4/5FR (SHEATH) ×1 IMPLANT
SHEATH RAIN RADIAL 21G 6FR (SHEATH) ×1 IMPLANT
TRANSDUCER W/STOPCOCK (MISCELLANEOUS) ×2 IMPLANT
TUBING CIL FLEX 10 FLL-RA (TUBING) ×2 IMPLANT

## 2017-08-19 NOTE — Anesthesia Preprocedure Evaluation (Addendum)
Anesthesia Evaluation  Patient identified by MRN, date of birth, ID band Patient awake    Reviewed: Allergy & Precautions, H&P , NPO status , Patient's Chart, lab work & pertinent test results  Airway Mallampati: I  TM Distance: >3 FB Neck ROM: full    Dental no notable dental hx. (+) Poor Dentition, Chipped, Missing, Loose, Dental Advisory Given,    Pulmonary former smoker,    Pulmonary exam normal breath sounds clear to auscultation       Cardiovascular negative cardio ROS Normal cardiovascular exam+ Valvular Problems/Murmurs MR  Rhythm:regular Rate:Normal  Bacterial endocarditis  TEE 1/19 IMPRESSION:   1. Endocarditis of the mitral valve, with mass and partially flail anterior leaflet, flail cord and also a large 0.5 cm round perforation. There is severe regurgitation. 2. No LAA thrombus 3. Negative for PFO 4. Aortic valve thickening with trivial AI 5. Mild TR, trivial PI 6. LVEF 60-65% 7. Trivial pericardial effusion    Neuro/Psych encephalopathic TIAnegative psych ROS   GI/Hepatic negative GI ROS, (+)     substance abuse  alcohol use, Hepatitis -, CPolysubstance abuse Patient with h/o polysubstance use per chart review, denies illicit drug use on exam. UDS positive for barbiturates and opiates    Endo/Other  negative endocrine ROS  Renal/GU   negative genitourinary   Musculoskeletal  (+) Arthritis ,   Abdominal   Peds negative pediatric ROS (+)  Hematology  (+) Blood dyscrasia, anemia ,   Anesthesia Other Findings   Reproductive/Obstetrics negative OB ROS                            Anesthesia Physical  Anesthesia Plan  ASA: IV  Anesthesia Plan: General   Post-op Pain Management:    Induction: Intravenous  PONV Risk Score and Plan: 1 and Propofol infusion, Ondansetron, Treatment may vary due to age or medical condition and Midazolam  Airway Management Planned:  Oral ETT  Additional Equipment:   Intra-op Plan:   Post-operative Plan: Extubation in OR  Informed Consent: I have reviewed the patients History and Physical, chart, labs and discussed the procedure including the risks, benefits and alternatives for the proposed anesthesia with the patient or authorized representative who has indicated his/her understanding and acceptance.     Plan Discussed with: CRNA, Anesthesiologist and Surgeon  Anesthesia Plan Comments: (  )       Anesthesia Quick Evaluation                                   Anesthesia Evaluation  Patient identified by MRN, date of birth, ID band Patient awake    Reviewed: Allergy & Precautions, H&P , NPO status , Patient's Chart, lab work & pertinent test results  Airway Mallampati: II   Neck ROM: full    Dental   Pulmonary former smoker,    breath sounds clear to auscultation       Cardiovascular  Rhythm:regular Rate:Normal  Bacterial endocarditis   Neuro/Psych encephalopathic    GI/Hepatic (+)     substance abuse  alcohol use, Hepatitis -, C  Endo/Other    Renal/GU      Musculoskeletal  (+) Arthritis ,   Abdominal   Peds  Hematology  (+) Blood dyscrasia, anemia ,   Anesthesia Other Findings   Reproductive/Obstetrics  Anesthesia Physical Anesthesia Plan  ASA: III  Anesthesia Plan: MAC   Post-op Pain Management:    Induction: Intravenous  PONV Risk Score and Plan: 1 and Propofol infusion, Ondansetron and Treatment may vary due to age or medical condition  Airway Management Planned: Nasal Cannula  Additional Equipment:   Intra-op Plan:   Post-operative Plan:   Informed Consent: I have reviewed the patients History and Physical, chart, labs and discussed the procedure including the risks, benefits and alternatives for the proposed anesthesia with the patient or authorized representative who has indicated  his/her understanding and acceptance.     Plan Discussed with: CRNA, Anesthesiologist and Surgeon  Anesthesia Plan Comments:         Anesthesia Quick Evaluation

## 2017-08-19 NOTE — Progress Notes (Signed)
Inpatient Rehabilitation  Per PT & OT request, patient was screened by Fae PippinMelissa Gearl Kimbrough for appropriateness for an Inpatient Acute Rehab consult.  At this time we are recommending an Inpatient Rehab consult.  Text paged medical team to notify.  Please order if you are agreeable.    Charlane FerrettiMelissa Decklyn Hornik, M.A., CCC/SLP Admission Coordinator  [facility]  Cell [phone]

## 2017-08-19 NOTE — Progress Notes (Signed)
I will follow pt's progress to assist with planning dispo once medical work up complete. Noted need for cefazolin. Noted pt was at [facility] p[facility] until he discharged with his Mom. He will need 24/7 supervision at d/c for any venue. [phone]

## 2017-08-19 NOTE — Progress Notes (Signed)
Pharmacy Antibiotic Note  Marcus [name] is a 52 y.o. male admitted on 08/09/2017 with pneumonia and endocarditis. Patient with h/o MSSA MV endocarditis and septic L knee arthitis in 06/2017 and T6-7 discitis.  s/p completed 6 weeks IV abx.  Readmitted with probable worsening of his mitral regurgitation, a persistent vegetation and persistent back pain. 3/14 TEE with MV vegetation. 08/15/17 BCx NGTD however, ID recommends to continue on cefazolin for probable persistent MSSA infection for now. Plan 6 weeks per ID (stop date 09/24/17). CT chest possible discitis vs. compression fracture, pending MRI spine for further evaluation. WBC WNL, currently AF.  SCr remains wnl , 1.10. Crcl ~ 53 ml/min.  Plan: Continue cefazolin 2g IV q8h F/u further infectious work up, clinical status, renal function  Height: 5\' 11"  (180.3 cm) Weight: 106 lb 4.2 oz (48.2 kg) IBW/kg (Calculated) : 75.3  Temp (24hrs), Avg:98.4 F (36.9 C), Min:97.6 F (36.4 C), Max:99.8 F (37.7 C)  Recent Labs  Lab 08/16/17 0357 08/17/17 0504 08/18/17 0605 08/19/17 0340 08/19/17 0952  WBC 9.1 13.4* 13.2* 15.9* 14.3*  CREATININE 1.15 1.20 1.15 1.23 1.10    Estimated Creatinine Clearance: 53.6 mL/min (by C-G formula based on SCr of 1.1 mg/dL).    No Known Allergies  Antimicrobials this admission: Vanc 3/9 >> 3/10 Cefepime 3/9 >> 3/11 Cefazolin 3/11 >> 3/13; 3/15 >>  Microbiology: 3/9: BCx x2: negative 3/10: Pleural fluid Cx: negative 3/10 BCx x2: negative  3/10 fungal (pleural): pending  3/15: BCx x2: ngtd x4 days 3/11 MRSA PCR: positive 3/9 Resp PCR: negative  3/9 UCx: mult speci/ recollect/final  Thank you for allowing pharmacy to be a part of this patient's care.  Noah Delaineuth Mahonri Seiden, RPh Clinical Pharmacist Pager: [phone](8a-330p) [phone] or [phone](330p-1030p) Main Rx [phone] 08/19/2017 3:52 PM

## 2017-08-19 NOTE — Interval H&P Note (Signed)
Cath Lab Visit (complete for each Cath Lab visit)  Clinical Evaluation Leading to the Procedure:   ACS: Yes.    Non-ACS:    Anginal Classification: CCS IV  Anti-ischemic medical therapy: Minimal Therapy (1 class of medications)  Non-Invasive Test Results: No non-invasive testing performed  Prior CABG: No previous CABG   Endocarditis, possible need for valve surgery   History and Physical Interval Note:  08/19/2017 7:37 AM  Marcus [name]  has presented today for surgery, with the diagnosis of severe mr - chf  The various methods of treatment have been discussed with the patient and family. After consideration of risks, benefits and other options for treatment, the patient has consented to  Procedure(s): RIGHT/LEFT HEART CATH AND CORONARY ANGIOGRAPHY (N/A) as a surgical intervention .  The patient's history has been reviewed, patient examined, no change in status, stable for surgery.  I have reviewed the patient's chart and labs.  Questions were answered to the patient's satisfaction.     Lance MussJayadeep Veda Arrellano
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
## 2017-08-19 NOTE — Anesthesia Postprocedure Evaluation (Signed)
Anesthesia Post Note  Patient: Kern ReapBryon T Stegmann  Procedure(s) Performed: MRI WITH ANESTHESIA (N/A )     Patient location during evaluation: PACU Anesthesia Type: General Level of consciousness: awake and alert Pain management: pain level controlled Vital Signs Assessment: post-procedure vital signs reviewed and stable Respiratory status: spontaneous breathing, nonlabored ventilation, respiratory function stable and patient connected to nasal cannula oxygen Cardiovascular status: blood pressure returned to baseline and stable Postop Assessment: no apparent nausea or vomiting Anesthetic complications: no    Last Vitals:  Vitals:   08/19/17 1620 08/19/17 2021  BP: 93/67 103/71  Pulse: 91 88  Resp: 17 (!) 26  Temp: 36.6 C 36.8 C  SpO2: 96% 97%    Last Pain:  Vitals:   08/19/17 2021  TempSrc: Oral  PainSc:    Pain Goal: Patients Stated Pain Goal: 0 (08/18/17 2031)               Chania Kochanski

## 2017-08-19 NOTE — Progress Notes (Signed)
Patient ID: Marcus [name], male   DOB: 08-20-1965, 52 y.o.   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  He completed 6 weeks of IV antibiotic therapy but is now back in the hospital with probable worsening of his mitral regurgitation, a persistent vegetation and persistent back pain.  Although all recent cultures have been negative I favor continuing treatment for probable persistent MSSA infection for now.  He has some leukocytosis but I do not feel like he needs any further workup for other infection or broader antibiotic therapy at this time.  PLAN: 1. Continue cefazolin  Principal Problem:   Severe mitral regurgitation Active Problems:   Endocarditis of mitral valve   Acute heart failure (HCC)   HCAP (healthcare-associated pneumonia)   Thoracic discitis   Hepatitis C antibody positive in blood   Sepsis (HCC)   Pleural effusion   Protein-calorie malnutrition, severe   Scheduled Meds: . feeding supplement (ENSURE ENLIVE)  237 mL Oral TID BM  . folic acid  1 mg Oral Daily  . furosemide  40 mg Intravenous BID  . LORazepam  1 mg Intravenous Once  . multivitamin with minerals  1 tablet Oral Daily  . mupirocin ointment   Nasal BID  . oxyCODONE-acetaminophen  1 tablet Oral Q6H  . potassium chloride  40 mEq Oral BID WC  . sodium chloride flush  3 mL Intravenous Q12H  . thiamine  100 mg Oral Daily   Or  . thiamine  100 mg Intravenous Daily   Continuous Infusions: . sodium chloride    .  ceFAZolin (ANCEF) IV Stopped (08/19/17 1209)   PRN Meds:.sodium chloride, acetaminophen, albuterol, ondansetron (ZOFRAN) IV, sodium chloride flush   SUBJECTIVE: He just returned from his cardiac  catheterization.  He states that there is no change in his back pain.  Review of Systems: Review of Systems  Constitutional: Positive for malaise/fatigue and weight loss. Negative for chills, diaphoresis and fever.  HENT: Negative for congestion and sore throat.   Respiratory: Negative for cough, sputum production and shortness of breath.   Cardiovascular: Negative for chest pain.  Gastrointestinal: Negative for abdominal pain, nausea and vomiting.  Genitourinary: Negative for dysuria.  Musculoskeletal: Positive for back pain. Negative for joint pain.  Skin: Negative for rash.  Neurological: Negative for dizziness and headaches.    No Known Allergies  OBJECTIVE: Vitals:   08/19/17 0819 08/19/17 0824 08/19/17 0947 08/19/17 1000  BP: 112/64 112/67 107/65 110/69  Pulse: 78 80    Resp: 15 13 12 17   Temp:      TempSrc:      SpO2: 98% 97% 98% 95%  Weight:      Height:       Body mass index is 14.82 kg/m.  Physical Exam  Constitutional: He is oriented to person, place, and time.  He appears relatively comfortable.  He is sitting up in bed.  HENT:  Poor dentition.  Cardiovascular: Normal rate and regular rhythm.  Murmur heard. 2/6 systolic murmur.  Pulmonary/Chest: Effort normal. He has no wheezes. He has no rales.  Slight kyphotic deformity and mid thoracic spine.  Abdominal:  Soft. There is no tenderness. There is no rebound.  Neurological: He is alert and oriented to person, place, and time.  Skin: No rash noted.  Psychiatric: Mood and affect normal.    Lab Results Lab Results  Component Value Date   WBC 14.3 (H) 08/19/2017   HGB 9.5 (L) 08/19/2017   HCT 29.8 (L) 08/19/2017   MCV 89.2 08/19/2017   PLT 265 08/19/2017    Lab Results  Component Value Date   CREATININE 1.10 08/19/2017   BUN 22 (H) 08/19/2017   NA 128 (L) 08/19/2017   K 3.2 (L) 08/19/2017   CL 88 (L) 08/19/2017   CO2 27 08/19/2017    Lab Results  Component Value Date   ALT <5 (L) 08/16/2017    AST 20 08/16/2017   ALKPHOS 101 08/16/2017   BILITOT 0.4 08/16/2017     Microbiology: Recent Results (from the past 240 hour(s))  Culture, blood (Routine x 2)     Status: None   Collection Time: 08/09/17  2:25 PM  Result Value Ref Range Status   Specimen Description BLOOD LEFT WRIST  Final   Special Requests IN PEDIATRIC BOTTLE Blood Culture adequate volume  Final   Culture   Final    NO GROWTH 5 DAYS Performed at [facility], [address] [place], [place] [address]    Report Status 08/14/2017 FINAL  Final  Culture, blood (Routine x 2)     Status: None   Collection Time: 08/09/17  2:48 PM  Result Value Ref Range Status   Specimen Description BLOOD RIGHT HAND  Final   Special Requests IN PEDIATRIC BOTTLE Blood Culture adequate volume  Final   Culture   Final    NO GROWTH 5 DAYS Performed at [facility], [address], [place], [place] [address]    Report Status 08/14/2017 FINAL  Final  Urine culture     Status: Abnormal   Collection Time: 08/09/17  3:50 PM  Result Value Ref Range Status   Specimen Description URINE, CLEAN CATCH  Final   Special Requests   Final    NONE Performed at [facility], [address], [place], [place] [address]    Culture MULTIPLE SPECIES PRESENT, SUGGEST RECOLLECTION (A)  Final   Report Status 08/10/2017 FINAL  Final  Respiratory Panel by PCR     Status: None   Collection Time: 08/09/17  4:00 PM  Result Value Ref Range Status   Adenovirus NOT DETECTED NOT DETECTED Final   Coronavirus 229E NOT DETECTED NOT DETECTED Final   Coronavirus HKU1 NOT DETECTED NOT DETECTED Final   Coronavirus NL63 NOT DETECTED NOT DETECTED Final   Coronavirus OC43 NOT DETECTED NOT DETECTED Final   Metapneumovirus NOT DETECTED NOT DETECTED Final   Rhinovirus / Enterovirus NOT DETECTED NOT DETECTED Final   Influenza A NOT DETECTED NOT DETECTED Final   Influenza B NOT DETECTED NOT DETECTED Final   Parainfluenza Virus 1 NOT DETECTED NOT DETECTED  Final   Parainfluenza Virus 2 NOT DETECTED NOT DETECTED Final   Parainfluenza Virus 3 NOT DETECTED NOT DETECTED Final   Parainfluenza Virus 4 NOT DETECTED NOT DETECTED Final   Respiratory Syncytial Virus NOT DETECTED NOT DETECTED Final   Bordetella pertussis NOT DETECTED NOT DETECTED Final   Chlamydophila pneumoniae NOT DETECTED NOT DETECTED Final   Mycoplasma pneumoniae NOT DETECTED NOT DETECTED Final    Comment: Performed at [facility], [address] [place], [place], [place] [address]  Culture, blood (routine x 2) Call MD if unable to obtain prior to antibiotics being given     Status: None   Collection Time: 08/10/17  1:10 AM  Result Value Ref Range Status   Specimen Description BLOOD RIGHT HAND  Final   Special Requests IN PEDIATRIC BOTTLE Blood Culture adequate volume  Final   Culture   Final    NO GROWTH 5 DAYS Performed at [facility], [address], [place], [place] [address]    Report Status 08/15/2017 FINAL  Final  Culture, blood (routine x 2) Call MD if unable to obtain prior to antibiotics being given     Status: None   Collection Time: 08/10/17  1:20 AM  Result Value Ref Range Status   Specimen Description BLOOD RIGHT ARM  Final   Special Requests   Final    BOTTLES DRAWN AEROBIC AND ANAEROBIC Blood Culture adequate volume   Culture   Final    NO GROWTH 5 DAYS Performed at [facility], [address] [place], [place] [address]    Report Status 08/15/2017 FINAL  Final  Fungus Culture With Stain     Status: None (Preliminary result)   Collection Time: 08/10/17 12:59 PM  Result Value Ref Range Status   Fungus Stain Final report  Final    Comment: (NOTE) Performed At: [facility] [address] [place], [place] [id number] Jolene Schimke MD WG:[id number]    Fungus (Mycology) Culture PENDING  Incomplete   Fungal Source PLEURAL  Final    Comment: RIGHT Performed at [facility], [address] [place], [place] [address]   Acid Fast  Smear (AFB)     Status: None   Collection Time: 08/10/17 12:59 PM  Result Value Ref Range Status   AFB Specimen Processing Concentration  Final   Acid Fast Smear Negative  Final    Comment: (NOTE) Performed At: [facility] [address] [place], [place] [id number] Jolene Schimke MD KG:[id number]    Source (AFB) PLEURAL  Final    Comment: RIGHT Performed at [facility], [address] [place], [place] [address]   Culture, body fluid-bottle     Status: None   Collection Time: 08/10/17 12:59 PM  Result Value Ref Range Status   Specimen Description PLEURAL RIGHT  Final   Special Requests NONE  Final   Culture   Final    NO GROWTH 5 DAYS Performed at [facility], [address], [place], [place] [address]    Report Status 08/15/2017 FINAL  Final  Gram stain     Status: None   Collection Time: 08/10/17 12:59 PM  Result Value Ref Range Status   Specimen Description PLEURAL RIGHT  Final   Special Requests NONE  Final   Gram Stain   Final    FEW WBC PRESENT,BOTH PMN AND MONONUCLEAR NO ORGANISMS SEEN Performed at [facility] Lab, [address], [place], [place] [address]    Report Status 08/10/2017 FINAL  Final  Fungus Culture Result     Status: None   Collection Time: 08/10/17 12:59 PM  Result Value Ref Range Status   Result 1 Comment  Final    Comment: (NOTE) KOH/Calcofluor preparation:  no fungus observed. Performed At: [facility] [address] [place], [place] [id number] Jolene Schimke MD [id number] Performed at [facility] Lab, [address], [place], [place] [address]   MRSA PCR Screening     Status:  Abnormal   Collection Time: 08/11/17  6:18 AM  Result Value Ref Range Status   MRSA by PCR POSITIVE (A) NEGATIVE Final    Comment:        The GeneXpert MRSA Assay (FDA approved for NASAL specimens only), is one component of a comprehensive MRSA colonization surveillance program. It is not intended to diagnose  MRSA infection nor to guide or monitor treatment for MRSA infections. RESULT CALLED TO, READ BACK BY AND VERIFIED WITH: RN Osa CraverR BUENDIA [phone] MLM Performed at [facility], [address] [place], [place] [address]   Culture, blood (routine x 2)     Status: None (Preliminary result)   Collection Time: 08/15/17 10:54 AM  Result Value Ref Range Status   Specimen Description BLOOD RIGHT HAND  Final   Special Requests   Final    BOTTLES DRAWN AEROBIC AND ANAEROBIC Blood Culture results may not be optimal due to an inadequate volume of blood received in culture bottles   Culture   Final    NO GROWTH 4 DAYS Performed at [facility], [address] [place], [place] [address]    Report Status PENDING  Incomplete  Culture, blood (routine x 2)     Status: None (Preliminary result)   Collection Time: 08/15/17 10:54 AM  Result Value Ref Range Status   Specimen Description BLOOD LEFT HAND  Final   Special Requests   Final    BOTTLES DRAWN AEROBIC AND ANAEROBIC Blood Culture adequate volume   Culture   Final    NO GROWTH 4 DAYS Performed at [facility], [address] [place], [place] [address]    Report Status PENDING  Incomplete    Cliffton AstersJohn Lotta Franken[place], MD [facility] [phone] pager   [phone] cell 08/19/2017, 12:34 PM

## 2017-08-19 NOTE — Plan of Care (Signed)
  Cardiovascular: Vascular access site(s) Level 0-1 will be maintained 08/19/2017 2310 - Progressing by Murlean HarkHinckley, Mckynna Vanloan, RN  R radial and L brachial access sites level 0

## 2017-08-19 NOTE — Progress Notes (Signed)
Nutrition Follow-up  DOCUMENTATION CODES:   Severe malnutrition in context of chronic illness, Underweight  INTERVENTION:    Continue Ensure Enlive po TID, each supplement provides 350 kcal and 20 grams of protein  Continue MVI daily  NUTRITION DIAGNOSIS:   Severe Malnutrition related to chronic illness(polysubstance abuse) as evidenced by severe fat depletion, severe muscle depletion.  Ongoing  GOAL:   Patient will meet greater than or equal to 90% of their needs  Unmet  MONITOR:   PO intake, Supplement acceptance, Skin, I & O's  REASON FOR ASSESSMENT:   Consult Assessment of nutrition requirement/status  ASSESSMENT:   52 yo male with PMH of arthritis, chronic back pain, endocarditis, MRSA bacteremia, polysubstance abuse, hepatitis C, mitral regurgitation, septic embolism, and malnutrition, who was admitted on 3/9 with worsening dyspnea, sepsis 2/2 HAP and new onset HF. S/P thoracentesis 3/10, 1.4 L removed.  From review of flow sheets, patient is consuming 0-100% of meals. He is drinking the Ensure supplements 2-3 times per day. Per RN, patient was in a procedure this morning and is currently in MRI, so he has not had anything to eat today.   Labs and medications reviewed.   Weight down quite a bit since admission, likely related to fluid losses. I/O -6.3 L since admission. Also question accuracy of weight measurements.   Diet Order:  Diet Heart Room service appropriate? Yes; Fluid consistency: Thin  EDUCATION NEEDS:   No education needs have been identified at this time  Skin:  Skin Assessment: Skin Integrity Issues: Skin Integrity Issues:: Other (Comment) Other: MASD to buttocks  Last BM:  3/15  Height:   Ht Readings from Last 1 Encounters:  08/10/17 5\' 11"  (1.803 m)    Weight:   Wt Readings from Last 1 Encounters:  08/19/17 106 lb 4.2 oz (48.2 kg)  08/10/17 156 lb 1.4 oz (70.8 kg)  Ideal Body Weight:  78.2 kg  BMI:  Body mass index is 14.82  kg/m.  Estimated Nutritional Needs:   Kcal:  2000-2200  Protein:  90-100 gm  Fluid:  1.5-2 L    Joaquin CourtsKimberly Harris, RD, LDN, CNSC Pager [phone] After Hours Pager [phone]

## 2017-08-19 NOTE — Transfer of Care (Signed)
Immediate Anesthesia Transfer of Care Note  Patient: Marcus [name]  Procedure(s) Performed: MRI WITH ANESTHESIA (N/A )  Patient Location: PACU  Anesthesia Type:General  Level of Consciousness: awake and patient cooperative  Airway & Oxygen Therapy: Patient Spontanous Breathing  Post-op Assessment: Report given to RN and Post -op Vital signs reviewed and stable  Post vital signs: Reviewed and stable  Last Vitals:  Vitals:   08/19/17 1505 08/19/17 1520  BP: 100/70 101/68  Pulse: 99 95  Resp: 20 15  Temp: 37.7 C   SpO2: 95% 95%    Last Pain:  Vitals:   08/19/17 1505  TempSrc:   PainSc: 0-No pain      Patients Stated Pain Goal: 0 (08/18/17 2031)  Complications: No apparent anesthesia complications

## 2017-08-20 ENCOUNTER — Other Ambulatory Visit (HOSPITAL_COMMUNITY): Payer: Self-pay

## 2017-08-20 ENCOUNTER — Encounter (HOSPITAL_COMMUNITY): Payer: Self-pay | Admitting: Interventional Radiology

## 2017-08-20 DIAGNOSIS — Z681 Body mass index (BMI) 19 or less, adult: Secondary | ICD-10-CM

## 2017-08-20 DIAGNOSIS — R195 Other fecal abnormalities: Secondary | ICD-10-CM

## 2017-08-20 DIAGNOSIS — R197 Diarrhea, unspecified: Secondary | ICD-10-CM

## 2017-08-20 DIAGNOSIS — M4644 Discitis, unspecified, thoracic region: Secondary | ICD-10-CM

## 2017-08-20 LAB — CULTURE, BLOOD (ROUTINE X 2)
CULTURE: NO GROWTH
CULTURE: NO GROWTH
SPECIAL REQUESTS: ADEQUATE

## 2017-08-20 LAB — DIFFERENTIAL
Basophils Absolute: 0 10*3/uL (ref 0.0–0.1)
Basophils Relative: 0 %
EOS ABS: 0.2 10*3/uL (ref 0.0–0.7)
EOS PCT: 1 %
LYMPHS ABS: 1.2 10*3/uL (ref 0.7–4.0)
Lymphocytes Relative: 5 %
MONO ABS: 0.7 10*3/uL (ref 0.1–1.0)
MONOS PCT: 3 %
NEUTROS PCT: 91 %
Neutro Abs: 20.2 10*3/uL — ABNORMAL HIGH (ref 1.7–7.7)

## 2017-08-20 LAB — CBC
HEMATOCRIT: 30.3 % — AB (ref 39.0–52.0)
HEMOGLOBIN: 9.4 g/dL — AB (ref 13.0–17.0)
MCH: 27.9 pg (ref 26.0–34.0)
MCHC: 31 g/dL (ref 30.0–36.0)
MCV: 89.9 fL (ref 78.0–100.0)
Platelets: 284 10*3/uL (ref 150–400)
RBC: 3.37 MIL/uL — AB (ref 4.22–5.81)
RDW: 14.7 % (ref 11.5–15.5)
WBC: 22.4 10*3/uL — AB (ref 4.0–10.5)

## 2017-08-20 LAB — C DIFFICILE QUICK SCREEN W PCR REFLEX
C Diff antigen: POSITIVE — AB
C Diff interpretation: DETECTED
C Diff toxin: POSITIVE — AB

## 2017-08-20 LAB — FUNGAL ORGANISM REFLEX

## 2017-08-20 LAB — BASIC METABOLIC PANEL
ANION GAP: 11 (ref 5–15)
BUN: 18 mg/dL (ref 6–20)
CHLORIDE: 97 mmol/L — AB (ref 101–111)
CO2: 24 mmol/L (ref 22–32)
Calcium: 8.1 mg/dL — ABNORMAL LOW (ref 8.9–10.3)
Creatinine, Ser: 1.06 mg/dL (ref 0.61–1.24)
GFR calc non Af Amer: >60 mL/min (ref >60)
GLUCOSE: 132 mg/dL — AB (ref 65–99)
Potassium: 3.7 mmol/L (ref 3.5–5.1)
Sodium: 132 mmol/L — ABNORMAL LOW (ref 135–145)

## 2017-08-20 LAB — FUNGUS CULTURE WITH STAIN

## 2017-08-20 LAB — FUNGUS CULTURE RESULT

## 2017-08-20 MED ORDER — POTASSIUM CHLORIDE CRYS ER 20 MEQ PO TBCR
20.0000 meq | EXTENDED_RELEASE_TABLET | Freq: Every day | ORAL | Status: DC
  Administered 2017-08-22 – 2017-08-27 (×6): 20 meq via ORAL
  Filled 2017-08-20 (×6): qty 1

## 2017-08-20 MED ORDER — VANCOMYCIN 50 MG/ML ORAL SOLUTION
125.0000 mg | Freq: Four times a day (QID) | ORAL | Status: DC
  Administered 2017-08-20 – 2017-08-22 (×5): 125 mg via ORAL
  Filled 2017-08-20 (×7): qty 2.5

## 2017-08-20 MED ORDER — FUROSEMIDE 10 MG/ML IJ SOLN
40.0000 mg | Freq: Every day | INTRAMUSCULAR | Status: DC
  Administered 2017-08-22 – 2017-08-23 (×2): 40 mg via INTRAVENOUS
  Filled 2017-08-20 (×2): qty 4

## 2017-08-20 MED FILL — Heparin Sodium (Porcine) 2 Unit/ML in Sodium Chloride 0.9%: INTRAMUSCULAR | Qty: 1000 | Status: AC

## 2017-08-20 NOTE — Anesthesia Preprocedure Evaluation (Addendum)
Anesthesia Evaluation  Patient identified by MRN, date of birth, ID band Patient awake    Reviewed: Allergy & Precautions, H&P , NPO status , Patient's Chart, lab work & pertinent test results  Airway Mallampati: II  TM Distance: >3 FB Neck ROM: Full    Dental no notable dental hx. (+) Poor Dentition, Dental Advisory Given   Pulmonary neg pulmonary ROS, former smoker,    Pulmonary exam normal breath sounds clear to auscultation       Cardiovascular Exercise Tolerance: Good + Valvular Problems/Murmurs MR  Rhythm:Regular Rate:Normal     Neuro/Psych negative neurological ROS  negative psych ROS   GI/Hepatic negative GI ROS, (+) Hepatitis -, C  Endo/Other  negative endocrine ROS  Renal/GU   negative genitourinary   Musculoskeletal   Abdominal   Peds  Hematology negative hematology ROS (+) anemia ,   Anesthesia Other Findings   Reproductive/Obstetrics negative OB ROS                           Anesthesia Physical Anesthesia Plan  ASA: III  Anesthesia Plan: General   Post-op Pain Management:    Induction: Intravenous  PONV Risk Score and Plan: 3 and Ondansetron, Dexamethasone and Midazolam  Airway Management Planned: Video Laryngoscope Planned and Nasal ETT  Additional Equipment:   Intra-op Plan:   Post-operative Plan: Extubation in OR  Informed Consent: I have reviewed the patients History and Physical, chart, labs and discussed the procedure including the risks, benefits and alternatives for the proposed anesthesia with the patient or authorized representative who has indicated his/her understanding and acceptance.   Dental advisory given  Plan Discussed with: CRNA  Anesthesia Plan Comments:         Anesthesia Quick Evaluation

## 2017-08-20 NOTE — Progress Notes (Addendum)
Family Medicine Teaching Service Daily Progress Note Intern Pager: [phone]  Patient name: Marcus [name]  Medical record number: [id number] Date of birth: [birth year] Age: 52 y.o. Gender: male  Primary Care Provider: Patient, No Pcp Per Consultants: CCM, ID, Cardiology, CVTS, Dental  Code Status: Full   Pt Overview and Major Events to Date:  3/09: Admit forsepsis thought to be secondary to HAP and new onsetHF,cultures obtained, broad-spectrum antibiotics initiated 3/10: PCCM consulted,tolerating BiPAP 3/10: thoracentesis 3/14 TEE with severe MR, CVTS consulted  3/21 plan for full dental extraction   Assessment and Plan: DEANTA MINCEY a 52 y.o.malepresenting with worsening SOB. 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  Endocarditis of mitral valve h/oembolic strokeand metabolic encephalopathy new onset heart failure Patient does have signs concerning for new onset heart failure 2/2 vavular dysfunction. Leukocytosis continuing to worse to 22.4. 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 -cardiology consulted, appreciate recommendations: lasix 40mg  daily -daily weight -strict Is and Os -CVTS consulted, appreciate recommendations  -continue ancef (3/15-) -ID following: keep on ancef pending CVTS sx (end date 09/24/17) -Dentistry consulted, appreciate recommendations: plan for full extraction 11A 3/21.   Osteomyelitis MRI on spine on 08/19/17 showing T7-T8 discitis-osteomyelitis without abscess and C6-7 suspicious for discitis-osteomyelitis without abscess. Mild spinal stenosis of T7-8 also seen.  -ID following, appreciate recommendations: spoke to Dr. 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 Current Hgb9.4. Appears to be chronically low with baseline of 8-9. Normocytic  -continue to monitor with daily CBC  Left knee edema h/o left MSSA septic arthritis Presented with lower extremity edema. Does have recent septic arthritis of the knee.DVT r/o with LE dopplers.  -continue to monitor   Hepatitis C genotype 2B Noted during previous hospitalization. No prior treatment. Recent viral load 224K.LFTsstable.  -ID consulted, appreciate recommendations: recommended outpatient tx   Compression deformities of mid to lower thoracic spine Patientreports history of fall back in January 2019he continues to have persistent low back pain since discharge. Patient reports use of OxyContin and oxycodone for pain control though no medications per chart review. PT recommending SNF.  -continue percocet 5-325 q6H scheduled, wean as tolerated  -MRI spine under general anesthesia   Polysubstance abuse UDS positive for barbiturates and opiates on presentation. Also has history of cocaine back in January 2019. Has known endocarditis likely from IV drug use though patient reports no recentillicit drug use. Currently on CIWA and COWS.CIWA not measured overnight. COWS not measured overnight. -Continue onCIWA  -Daily folate and thiamine -monitor COWS score -percocet 5 Q6, can plan to wean as tolerated   Hyponatremia Current Na improved to 132. Likely 2/2 poor oral intake as patient has been NPO on and off for several days and likely had poor oral intake prior to admission.  -monitor on daily BMP -encourage PO intake   Hypokalemia-resolved K of 3.7 -replete with KDur as needed -monitor on daily BMP  Protein calorie malnutrition  Albumin of 2.3 on 3/16. Likely 2/2 poor oral intake -consult nutrition, appreciate recommendations  Loose stools RN reports 3 mucous loose stools.  -C diff panel -enteric precautions -spoke to Dr. Orvan Falconer, ID, over  phone. State c-diff is likely cause of elevated WBC count   Disposition Recommended by therapy for inpatient rehab. Screened by  Fae Pippin who also recommends CIR consult. -likely dc to CIR when medically stable   FEN/GI:heart healthy diet WUJ:WJXB, have discontinued lovenox for dental procedure.   Disposition: continued inpatient stay   Subjective:  Patient today in better spirits. States he feels better. Reports no SOB or CP. States some back pain. Smiling and making jokes in room. Reports abdominal pain   Objective: Temp:  [97.6 F (36.4 C)-99.8 F (37.7 C)] 99 F (37.2 C) (03/20 0446) Pulse Rate:  [88-99] 92 (03/20 0446) Resp:  [15-26] 20 (03/20 0446) BP: (93-112)/(52-71) 112/66 (03/20 0446) SpO2:  [95 %-97 %] 96 % (03/20 0446) Weight:  [110 lb 3.7 oz (50 kg)] 110 lb 3.7 oz (50 kg) (03/20 0446) Physical Exam: General: awake and alert, NAD, sitting up in bed, smiling and laughing  Cardiovascular: RRR, grade 3 systolic murmur  Respiratory: CTAB, no wheezes, rales, or rhonchi  Abdomen: soft, non tender, non distended, bowel sounds normal  Extremities: non tender, no edema MSK: no paraspinal tenderness or vertebral tenderness   Laboratory: Recent Labs  Lab 08/19/17 0340 08/19/17 0952 08/20/17 0319  WBC 15.9* 14.3* 22.4*  HGB 9.6* 9.5* 9.4*  HCT 30.4* 29.8* 30.3*  PLT 321 265 284   Recent Labs  Lab 08/14/17 0531 08/15/17 1054 08/16/17 0357  08/18/17 0605 08/19/17 0340 08/19/17 0952 08/20/17 0319  NA 136 136 132*   < > 130* 128*  --  132*  K 3.9 4.2 3.9   < > 3.8 3.2*  --  3.7  CL 99* 101 95*   < > 91* 88*  --  97*  CO2 28 26 26    < > 28 27  --  24  BUN 16 14 15    < > 19 22*  --  18  CREATININE 1.07 1.16 1.15   < > 1.15 1.23 1.10 1.06  CALCIUM 8.4* 8.5* 8.4*   < > 8.6* 8.4*  --  8.1*  PROT 6.8 6.7 6.8  --   --   --   --   --   BILITOT 0.7 0.5 0.4  --   --   --   --   --   ALKPHOS 96 88 101  --   --   --   --   --   ALT <5* <5* <5*  --   --   --   --    --   AST 16 22 20   --   --   --   --   --   GLUCOSE 98 124* 95   < > 94 89  --  132*   < > = values in this interval not displayed.    Imaging/Diagnostic Tests: Dg Orthopantogram  Result Date: 08/15/2017 CLINICAL DATA:  Poor dentition.  Cardiac workup. EXAM: ORTHOPANTOGRAM/PANORAMIC COMPARISON:  None. FINDINGS: There are numerous missing maxillary and mandibular teeth, and multiple remaining maxillary teeth appear broken. There is mild periapical lucency associated with a left maxillary premolar tooth. IMPRESSION: Poor dentition with multiple missing and broken teeth. Mild periapical lucency associated with a left maxillary premolar tooth, abscess not excluded. Electronically Signed   By: Sebastian Ache M.D.   On: 08/15/2017 09:12   Dg Chest 2 View  Result Date: 08/14/2017 CLINICAL DATA:  Followup pneumonia. EXAM: CHEST - 2 VIEW COMPARISON:  08/10/2016 and older studies. FINDINGS: Moderate right and small left pleural effusions. There is associated lung base opacity, greater on the right, which is consistent with atelectasis, pneumonia or a  combination. The patchy airspace opacity noted along the perihilar left lung on the prior study has improved. Airspace opacity in the right mid to lower lung appears somewhat less confluent but is unchanged in extent. No new lung abnormalities.  No pneumothorax. IMPRESSION: 1. Mild improvement in lung consolidation compared to the prior exam. 2. Persistent moderate right and small left pleural effusions. No new abnormalities. Electronically Signed   By: Amie Portland M.D.   On: 08/14/2017 13:16   Dg Chest 2 View  Addendum Date: 08/09/2017   ADDENDUM REPORT: 08/09/2017 15:41 ADDENDUM: After further review, there are compression deformities of 2 adjacent vertebral bodies in the mid to lower thoracic spine, of uncertain age but new compared to an older chest x-ray 02/15/2005. Would consider further characterization with CT. These results were called by telephone at the  time of interpretation on 08/09/2017 at 3:40 pm to Dr. Doug Sou , who verbally acknowledged these results. Electronically Signed   By: Bary Richard M.D.   On: 08/09/2017 15:41   Result Date: 08/09/2017 CLINICAL DATA:  Shortness of breath for 2 days. Bacterial meningitis diagnosed at the beginning of January, than recently diagnosed with pneumonia. History of TIA in January with left-sided weakness. Former smoker. EXAM: CHEST - 2 VIEW COMPARISON:  Chest x-ray dated 06/08/2017. FINDINGS: New dense opacity at the right lung base, likely a combination of consolidation and pleural effusion. Pleural effusion component is at least moderate in size. Patchy opacities are seen within the left perihilar and lower lung zones, favor edema. Additional opacity at the left lung base is likely a combination of atelectasis and small pleural effusion. Heart size and mediastinal contours are stable. Osseous structures about the chest are unremarkable. IMPRESSION: 1. New dense opacity at the right lung base, likely a combination of pneumonia or atelectasis and pleural effusion. The pleural effusion component is at least moderate in size. Would consider chest CT for further characterization. 2. Additional patchy ill-defined opacities within the left mid and lower lung, most likely edema, pneumonia considered less likely. 3. Probable mild atelectasis and/or small pleural effusion at the left lung base. Electronically Signed: By: Bary Richard M.D. On: 08/09/2017 15:29   Ct Angio Chest Pe W And/or Wo Contrast  Result Date: 08/09/2017 CLINICAL DATA:  SOB x2 days. Pt diagnosed with bacterial meningitis at the beginning of January, then recently diagnosed with pneumonia. Pt also has hx of TIA beginning of January that left him with left sided weakness. Patient had left knee surgery x 6 weeks ago for sepsis. EXAM: CT ANGIOGRAPHY CHEST WITH CONTRAST TECHNIQUE: Multidetector CT imaging of the chest was performed using the standard  protocol during bolus administration of intravenous contrast. Multiplanar CT image reconstructions and MIPs were obtained to evaluate the vascular anatomy. CONTRAST:  ISOVUE-370 IOPAMIDOL (ISOVUE-370) INJECTION 76% COMPARISON:  Current chest radiographs and prior studies. FINDINGS: Cardiovascular: Satisfactory opacification of the pulmonary arteries to the segmental level. No evidence of pulmonary embolism. Heart is normal in size and configuration. No pericardial effusion. No coronary artery calcifications. The great vessels normal in caliber. No aortic atherosclerosis. No dissection. Mediastinum/Nodes: No neck base or axillary masses or pathologically enlarged lymph nodes. No mediastinal or hilar masses or discrete enlarged lymph nodes. The trachea is patent and normal in caliber. Esophagus is unremarkable. Lungs/Pleura: Large right and small left pleural effusions. Complete atelectasis of the right middle lobe and near complete atelectasis of the right lower lobe. Small area of consolidation in the anterior inferior right upper lobe.  Patchy consolidation is noted in a peribronchovascular distribution in the left upper lobe and, to lesser degree, left lower lobe. There is dependent opacity also in the left lower lobe consistent with atelectasis. No pneumothorax. Upper Abdomen: No acute abnormality. Musculoskeletal: There is marked narrowing of the T6-T7 disc space with resorption/irregularity of the lower endplate of T6 in upper endplate of T7, with mild loss of height of these vertebra. This leads to a mild focal kyphosis. This is new since a chest radiograph dated 02/15/2005. A subtle lucency across the T6 spinous process posteriorly suggests a previous fracture. All remaining vertebral bodies are normal in height. No osteoblastic or osteolytic lesions. Review of the MIP images confirms the above findings. IMPRESSION: 1. Patchy areas of consolidation in both lungs consistent with multifocal pneumonia. 2.  Complete atelectasis of the right middle lobe and near complete atelectasis of the right lower lobe. Mild dependent atelectasis in the left lower lobe. 3. Large right and small left pleural effusions. 4. Marked loss of the disc space at T6-C7 with irregular resorption of endplates leading to loss of vertebral body height of T6 and T7 and a focal kyphosis. This may reflect the sequelae of an old fracture. It could be due to previous discitis/osteomyelitis. Electronically Signed   By: Amie Portland M.D.   On: 08/09/2017 18:44   Mr Brain Wo Contrast  Result Date: 08/14/2017 CLINICAL DATA:  52 y/o  M; stroke follow-up. EXAM: MRI HEAD WITHOUT CONTRAST TECHNIQUE: Axial DWI sequence was acquired. Patient combative, unable to continue examination. COMPARISON:  06/09/2017 MRI head.  06/08/2017 CT head. FINDINGS: Low B value DWI sequence demonstrates areas of T2 hyperintensity in the right greater than left frontal, parietal, and occipital lobes as well as splenium of corpus callosum and cerebellum corresponding to infarctions on prior MRI. The high B value sequence is motion degraded, insufficient to assess for reduced diffusion and acute stroke. IMPRESSION: Severe motion degraded DWI sequence insufficient to assess for acute stroke. Repeat imaging is recommended when patient is able to hold still and follow commands. Electronically Signed   By: Mitzi Hansen M.D.   On: 08/14/2017 21:38   Mr Laqueta Jean ZO Contrast  Result Date: 08/19/2017 CLINICAL DATA:  Hospitalized in January with bacteremia, mitral valve endocarditis, CNS septic emboli, and thoracic spine discitis. Persistent back pain. EXAM: MRI HEAD WITHOUT AND WITH CONTRAST MRI THORACIC SPINE WITHOUT AND WITH CONTRAST MRI LUMBAR SPINE WITHOUT AND WITH CONTRAST TECHNIQUE: Multiplanar, multiecho pulse sequences of the brain and surrounding structures, and cervical spine, to include the craniocervical junction and cervicothoracic junction, were obtained  without and with intravenous contrast. Multiplanar and multiecho pulse sequences of the thoracic and lumbar spine were obtained without and with intravenous contrast. CONTRAST:  9mL MULTIHANCE GADOBENATE DIMEGLUMINE 529 MG/ML IV SOLN COMPARISON:  Brain MRI 06/09/2017. CTA chest, abdomen, and pelvis 08/14/2017. Lumbar spine CT 12/30/2015. FINDINGS: MRI HEAD FINDINGS Brain: Embolic infarcts throughout the brain on the prior MRI demonstrate interval evolution. Associated diffusion abnormality has decreased though not completely resolved in some of the areas of infarction, and there is developing encephalomalacia. Associated chronic blood products are present with the majority of the infarcts. Multiple infarcts demonstrate enhancement. No definite restricted diffusion is seen to clearly indicate acute infarction. There are a few subcentimeter infarcts in the left centrum semiovale which are new from the prior MRI and demonstrate mild trace diffusion abnormality without reduced ADC, consistent with interval subacute infarcts. No intracranial mass, midline shift, or extra-axial fluid collection is  seen. There is mild cerebral atrophy. No abnormal meningeal enhancement is identified. Vascular: Major intracranial vascular flow voids are preserved. Skull and upper cervical spine: Unremarkable bone marrow signal. Sinuses/Orbits: Unremarkable orbits. Clear paranasal sinuses. Trace left mastoid fluid. Other: None. MRI THORACIC SPINE FINDINGS Spinal numbering is performed by counting down from the craniocervical junction. This results in different numbering than what was reported on the prior CTA due to transitional lumbosacral anatomy. Some sequences are up to moderately motion degraded. Alignment: Slight focal kyphosis at T7-8.  No listhesis. Vertebrae: Disc and endplate destruction at T7-8 as seen on prior CT with diffuse enhancement throughout the residual disc space and marrow of both T7 and T8 vertebral bodies extending  into the pedicles. Minimal dorsal epidural enhancement at T7-8 without evidence of epidural abscess. Partially visualized marrow edema and enhancement in the C6 and C7 vertebral bodies with endplate irregularity, disc space narrowing, and enhancement anteriorly in the disc space. No evidence of C6-7 epidural abscess on limited sagittal imaging. Very mild endplate edema and enhancement at T11-12 favored to be degenerative given lack of disc abnormality or endplate erosion to suggest infection. Background diminished T1 bone marrow signal intensity diffusely is likely related to patient's known anemia. 1.4 cm enhancing lesion in the posterior elements on the right at T3 is consistent with an hemangioma. A smaller atypical hemangioma is also suspected in the right T2 pedicle. Cord:  Normal signal. Paraspinal and other soft tissues: Paravertebral soft tissue edema/phlegmon from T6-T8. Interspinous soft tissue edema and enhancement at T7-8. No paraspinal abscess identified. Partially visualized prevertebral inflammation in the lower cervical spine. Moderate right and small left pleural effusions with associated compressive atelectasis on the right. Small volume fluid in the esophagus. Partially visualized endotracheal tube and tracheal secretions. Disc levels: Moderate disc space narrowing, disc bulging, and endplate spurring at C7-T1 result in moderate right and mild-to-moderate left neural foraminal stenosis and at most mild spinal stenosis. Broad-based posterior disc osteophyte complex and facet hypertrophy at T7-8 result in mild spinal stenosis without cord compression. Mild disc bulging and scattered tiny disc protrusions elsewhere in the thoracic spine do not result in spinal stenosis. MRI LUMBAR SPINE FINDINGS Some sequences are mildly motion degraded. Segmentation: Transitional lumbosacral anatomy with a lumbarized S1. Alignment: Facet mediated anterolisthesis of L5 on S1 measuring 4 mm. Vertebrae: Diffusely  diminished T1 bone marrow signal intensity likely related to anemia. Predominantly type 2 degenerative endplate changes at S1-2. Mild type 1 changes at L3-4. No evidence of infectious discitis-osteomyelitis. No fracture. Conus medullaris: Extends to the L2 level and appears normal. Paraspinal and other soft tissues: Mild nonspecific posterior paraspinal muscle edema bilaterally in the mid and lower lumbar spine. No fluid collection. Disc levels: L1-2: Negative. L2-3: Minimal disc bulging without stenosis. L3-4: Mild disc space narrowing. Mild disc bulging and mild facet and ligamentum flavum hypertrophy result in mild bilateral lateral recess stenosis and mild left neural foraminal stenosis without significant spinal stenosis. L4-5: Mild disc space narrowing. Mild disc bulging asymmetric to the right and moderate right and mild left facet hypertrophy result in mild right lateral recess stenosis and mild right neural foraminal stenosis without significant spinal stenosis. L5-S1: Anterolisthesis with disc uncovering and severe facet hypertrophy result in mild spinal stenosis, mild-to-moderate bilateral lateral recess stenosis, and mild-to-moderate bilateral neural foraminal stenosis. S1-2: Transitional level with disc space narrowing. Mild disc bulging and moderate facet arthrosis result in mild left neural foraminal stenosis without spinal stenosis. IMPRESSION: 1. Interval evolution of widespread embolic brain  infarcts from 06/2017. 2. Interval tiny subacute infarcts in the left centrum semiovale. No acute infarct. 3. Transitional lumbosacral anatomy as above. 4. 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 No epidural or paraspinal abscess. 5. Partially visualized disc and endplate enhancement at C6-7 suspicious for discitis-osteomyelitis with prevertebral inflammation. No evidence of epidural abscess though this was incompletely imaged. 6. Mild spinal stenosis at T7-8. 7. No evidence  of discitis-osteomyelitis in the lumbar spine. 8. Nonspecific bilateral lumbar posterior paraspinal muscle edema without abscess. 9. Lumbar disc and facet degeneration most notable at L5-S1 where there is grade 1 anterolisthesis, mild spinal stenosis, and mild-to-moderate lateral recess and neural foraminal stenosis. 10. Moderate right and small left pleural effusions. Electronically Signed   By: Sebastian Ache M.D.   On: 08/19/2017 15:55   Mr Thoracic Spine W Wo Contrast  Result Date: 08/19/2017 CLINICAL DATA:  Hospitalized in January with bacteremia, mitral valve endocarditis, CNS septic emboli, and thoracic spine discitis. Persistent back pain. EXAM: MRI HEAD WITHOUT AND WITH CONTRAST MRI THORACIC SPINE WITHOUT AND WITH CONTRAST MRI LUMBAR SPINE WITHOUT AND WITH CONTRAST TECHNIQUE: Multiplanar, multiecho pulse sequences of the brain and surrounding structures, and cervical spine, to include the craniocervical junction and cervicothoracic junction, were obtained without and with intravenous contrast. Multiplanar and multiecho pulse sequences of the thoracic and lumbar spine were obtained without and with intravenous contrast. CONTRAST:  9mL MULTIHANCE GADOBENATE DIMEGLUMINE 529 MG/ML IV SOLN COMPARISON:  Brain MRI 06/09/2017. CTA chest, abdomen, and pelvis 08/14/2017. Lumbar spine CT 12/30/2015. FINDINGS: MRI HEAD FINDINGS Brain: Embolic infarcts throughout the brain on the prior MRI demonstrate interval evolution. Associated diffusion abnormality has decreased though not completely resolved in some of the areas of infarction, and there is developing encephalomalacia. Associated chronic blood products are present with the majority of the infarcts. Multiple infarcts demonstrate enhancement. No definite restricted diffusion is seen to clearly indicate acute infarction. There are a few subcentimeter infarcts in the left centrum semiovale which are new from the prior MRI and demonstrate mild trace diffusion  abnormality without reduced ADC, consistent with interval subacute infarcts. No intracranial mass, midline shift, or extra-axial fluid collection is seen. There is mild cerebral atrophy. No abnormal meningeal enhancement is identified. Vascular: Major intracranial vascular flow voids are preserved. Skull and upper cervical spine: Unremarkable bone marrow signal. Sinuses/Orbits: Unremarkable orbits. Clear paranasal sinuses. Trace left mastoid fluid. Other: None. MRI THORACIC SPINE FINDINGS Spinal numbering is performed by counting down from the craniocervical junction. This results in different numbering than what was reported on the prior CTA due to transitional lumbosacral anatomy. Some sequences are up to moderately motion degraded. Alignment: Slight focal kyphosis at T7-8.  No listhesis. Vertebrae: Disc and endplate destruction at T7-8 as seen on prior CT with diffuse enhancement throughout the residual disc space and marrow of both T7 and T8 vertebral bodies extending into the pedicles. Minimal dorsal epidural enhancement at T7-8 without evidence of epidural abscess. Partially visualized marrow edema and enhancement in the C6 and C7 vertebral bodies with endplate irregularity, disc space narrowing, and enhancement anteriorly in the disc space. No evidence of C6-7 epidural abscess on limited sagittal imaging. Very mild endplate edema and enhancement at T11-12 favored to be degenerative given lack of disc abnormality or endplate erosion to suggest infection. Background diminished T1 bone marrow signal intensity diffusely is likely related to patient's known anemia. 1.4 cm enhancing lesion in the posterior elements on the right at T3 is consistent with an hemangioma. A smaller atypical hemangioma  is also suspected in the right T2 pedicle. Cord:  Normal signal. Paraspinal and other soft tissues: Paravertebral soft tissue edema/phlegmon from T6-T8. Interspinous soft tissue edema and enhancement at T7-8. No paraspinal  abscess identified. Partially visualized prevertebral inflammation in the lower cervical spine. Moderate right and small left pleural effusions with associated compressive atelectasis on the right. Small volume fluid in the esophagus. Partially visualized endotracheal tube and tracheal secretions. Disc levels: Moderate disc space narrowing, disc bulging, and endplate spurring at C7-T1 result in moderate right and mild-to-moderate left neural foraminal stenosis and at most mild spinal stenosis. Broad-based posterior disc osteophyte complex and facet hypertrophy at T7-8 result in mild spinal stenosis without cord compression. Mild disc bulging and scattered tiny disc protrusions elsewhere in the thoracic spine do not result in spinal stenosis. MRI LUMBAR SPINE FINDINGS Some sequences are mildly motion degraded. Segmentation: Transitional lumbosacral anatomy with a lumbarized S1. Alignment: Facet mediated anterolisthesis of L5 on S1 measuring 4 mm. Vertebrae: Diffusely diminished T1 bone marrow signal intensity likely related to anemia. Predominantly type 2 degenerative endplate changes at S1-2. Mild type 1 changes at L3-4. No evidence of infectious discitis-osteomyelitis. No fracture. Conus medullaris: Extends to the L2 level and appears normal. Paraspinal and other soft tissues: Mild nonspecific posterior paraspinal muscle edema bilaterally in the mid and lower lumbar spine. No fluid collection. Disc levels: L1-2: Negative. L2-3: Minimal disc bulging without stenosis. L3-4: Mild disc space narrowing. Mild disc bulging and mild facet and ligamentum flavum hypertrophy result in mild bilateral lateral recess stenosis and mild left neural foraminal stenosis without significant spinal stenosis. L4-5: Mild disc space narrowing. Mild disc bulging asymmetric to the right and moderate right and mild left facet hypertrophy result in mild right lateral recess stenosis and mild right neural foraminal stenosis without significant  spinal stenosis. L5-S1: Anterolisthesis with disc uncovering and severe facet hypertrophy result in mild spinal stenosis, mild-to-moderate bilateral lateral recess stenosis, and mild-to-moderate bilateral neural foraminal stenosis. S1-2: Transitional level with disc space narrowing. Mild disc bulging and moderate facet arthrosis result in mild left neural foraminal stenosis without spinal stenosis. IMPRESSION: 1. 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 2. Interval tiny subacute infarcts in the left centrum semiovale. No acute infarct. 3. Transitional lumbosacral anatomy as above. 4. 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 No epidural or paraspinal abscess. 5. Partially visualized disc and endplate enhancement at C6-7 suspicious for discitis-osteomyelitis with prevertebral inflammation. No evidence of epidural abscess though this was incompletely imaged. 6. Mild spinal stenosis at T7-8. 7. No evidence of discitis-osteomyelitis in the lumbar spine. 8. Nonspecific bilateral lumbar posterior paraspinal muscle edema without abscess. 9. Lumbar disc and facet degeneration most notable at L5-S1 where there is grade 1 anterolisthesis, mild spinal stenosis, and mild-to-moderate lateral recess and neural foraminal stenosis. 10. Moderate right and small left pleural effusions. Electronically Signed   By: Sebastian AcheAllen  Grady M.D.   On: 08/19/2017 15:55   Mr Lumbar Spine W Wo Contrast  Result Date: 08/19/2017 CLINICAL DATA:  Hospitalized in January with bacteremia, mitral valve endocarditis, CNS septic emboli, and thoracic spine discitis. Persistent back pain. EXAM: MRI HEAD WITHOUT AND WITH CONTRAST MRI THORACIC SPINE WITHOUT AND WITH CONTRAST MRI LUMBAR SPINE WITHOUT AND WITH CONTRAST TECHNIQUE: Multiplanar, multiecho pulse sequences of the brain and surrounding structures, and cervical spine, to include the craniocervical junction and cervicothoracic junction, were  obtained without and with intravenous contrast. Multiplanar and multiecho pulse sequences of the thoracic and lumbar spine were obtained  without and with intravenous contrast. CONTRAST:  9mL MULTIHANCE GADOBENATE DIMEGLUMINE 529 MG/ML IV SOLN COMPARISON:  Brain MRI 06/09/2017. CTA chest, abdomen, and pelvis 08/14/2017. Lumbar spine CT 12/30/2015. FINDINGS: MRI HEAD FINDINGS Brain: Embolic infarcts throughout the brain on the prior MRI demonstrate interval evolution. Associated diffusion abnormality has decreased though not completely resolved in some of the areas of infarction, and there is developing encephalomalacia. Associated chronic blood products are present with the majority of the infarcts. Multiple infarcts demonstrate enhancement. No definite restricted diffusion is seen to clearly indicate acute infarction. There are a few subcentimeter infarcts in the left centrum semiovale which are new from the prior MRI and demonstrate mild trace diffusion abnormality without reduced ADC, consistent with interval subacute infarcts. No intracranial mass, midline shift, or extra-axial fluid collection is seen. There is mild cerebral atrophy. No abnormal meningeal enhancement is identified. Vascular: Major intracranial vascular flow voids are preserved. Skull and upper cervical spine: Unremarkable bone marrow signal. Sinuses/Orbits: Unremarkable orbits. Clear paranasal sinuses. Trace left mastoid fluid. Other: None. MRI THORACIC SPINE FINDINGS Spinal numbering is performed by counting down from the craniocervical junction. This results in different numbering than what was reported on the prior CTA due to transitional lumbosacral anatomy. Some sequences are up to moderately motion degraded. Alignment: Slight focal kyphosis at T7-8.  No listhesis. Vertebrae: Disc and endplate destruction at T7-8 as seen on prior CT with diffuse enhancement throughout the residual disc space and marrow of both T7 and T8 vertebral bodies  extending into the pedicles. Minimal dorsal epidural enhancement at T7-8 without evidence of epidural abscess. Partially visualized marrow edema and enhancement in the C6 and C7 vertebral bodies with endplate irregularity, disc space narrowing, and enhancement anteriorly in the disc space. No evidence of C6-7 epidural abscess on limited sagittal imaging. Very mild endplate edema and enhancement at T11-12 favored to be degenerative given lack of disc abnormality or endplate erosion to suggest infection. Background diminished T1 bone marrow signal intensity diffusely is likely related to patient's known anemia. 1.4 cm enhancing lesion in the posterior elements on the right at T3 is consistent with an hemangioma. A smaller atypical hemangioma is also suspected in the right T2 pedicle. Cord:  Normal signal. Paraspinal and other soft tissues: Paravertebral soft tissue edema/phlegmon from T6-T8. Interspinous soft tissue edema and enhancement at T7-8. No paraspinal abscess identified. Partially visualized prevertebral inflammation in the lower cervical spine. Moderate right and small left pleural effusions with associated compressive atelectasis on the right. Small volume fluid in the esophagus. Partially visualized endotracheal tube and tracheal secretions. Disc levels: Moderate disc space narrowing, disc bulging, and endplate spurring at C7-T1 result in moderate right and mild-to-moderate left neural foraminal stenosis and at most mild spinal stenosis. Broad-based posterior disc osteophyte complex and facet hypertrophy at T7-8 result in mild spinal stenosis without cord compression. Mild disc bulging and scattered tiny disc protrusions elsewhere in the thoracic spine do not result in spinal stenosis. MRI LUMBAR SPINE FINDINGS Some sequences are mildly motion degraded. Segmentation: Transitional lumbosacral anatomy with a lumbarized S1. Alignment: Facet mediated anterolisthesis of L5 on S1 measuring 4 mm. Vertebrae:  Diffusely diminished T1 bone marrow signal intensity likely related to anemia. Predominantly type 2 degenerative endplate changes at S1-2. Mild type 1 changes at L3-4. No evidence of infectious discitis-osteomyelitis. No fracture. Conus medullaris: Extends to the L2 level and appears normal. Paraspinal and other soft tissues: Mild nonspecific posterior paraspinal muscle edema bilaterally in the mid and lower lumbar spine. No fluid  collection. Disc levels: L1-2: Negative. L2-3: Minimal disc bulging without stenosis. L3-4: Mild disc space narrowing. Mild disc bulging and mild facet and ligamentum flavum hypertrophy result in mild bilateral lateral recess stenosis and mild left neural foraminal stenosis without significant spinal stenosis. L4-5: Mild disc space narrowing. Mild disc bulging asymmetric to the right and moderate right and mild left facet hypertrophy result in mild right lateral recess stenosis and mild right neural foraminal stenosis without significant spinal stenosis. L5-S1: Anterolisthesis with disc uncovering and severe facet hypertrophy result in mild spinal stenosis, mild-to-moderate bilateral lateral recess stenosis, and mild-to-moderate bilateral neural foraminal stenosis. S1-2: Transitional level with disc space narrowing. Mild disc bulging and moderate facet arthrosis result in mild left neural foraminal stenosis without spinal stenosis. IMPRESSION: 1. 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 2. Interval tiny subacute infarcts in the left centrum semiovale. No acute infarct. 3. Transitional lumbosacral anatomy as above. 4. 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 No epidural or paraspinal abscess. 5. Partially visualized disc and endplate enhancement at C6-7 suspicious for discitis-osteomyelitis with prevertebral inflammation. No evidence of epidural abscess though this was incompletely imaged. 6. Mild spinal stenosis at T7-8. 7. No  evidence of discitis-osteomyelitis in the lumbar spine. 8. Nonspecific bilateral lumbar posterior paraspinal muscle edema without abscess. 9. Lumbar disc and facet degeneration most notable at L5-S1 where there is grade 1 anterolisthesis, mild spinal stenosis, and mild-to-moderate lateral recess and neural foraminal stenosis. 10. Moderate right and small left pleural effusions. Electronically Signed   By: Sebastian Ache M.D.   On: 08/19/2017 15:55   Dg Chest Port 1 View  Result Date: 08/10/2017 CLINICAL DATA:  Post right thora, 1.4 L removed EXAM: PORTABLE CHEST 1 VIEW COMPARISON:  Chest x-ray dated 08/09/2017. FINDINGS: Some improvement in aeration at the left lung base. Residual opacity is presumably atelectasis or pneumonia. Ill-defined opacities persist throughout the left lung, consistent with the multifocal pneumonia better demonstrated on chest CT of 08/09/2017. Additional persistent atelectasis at the left lung base. No pneumothorax. Heart size and mediastinal contours appear stable. IMPRESSION: 1. Improved aeration at the right lung base status post thoracentesis. Residual opacity is presumably atelectasis or pneumonia. No pneumothorax seen. 2. Stable patchy opacities throughout the left lung, compatible with the multifocal pneumonia demonstrated on yesterday's chest CT. Electronically Signed   By: Bary Richard M.D.   On: 08/10/2017 14:54   Ct Angio Chest/abd/pel For Dissection W And/or W/wo  Result Date: 08/14/2017 CLINICAL DATA:  Septic arterial embolism EXAM: CT ANGIOGRAPHY CHEST, ABDOMEN AND PELVIS TECHNIQUE: Multidetector CT imaging through the chest, abdomen and pelvis was performed using the standard protocol during bolus administration of intravenous contrast. Multiplanar reconstructed images and MIPs were obtained and reviewed to evaluate the vascular anatomy. CONTRAST:  100 cc ISOVUE-370 IOPAMIDOL (ISOVUE-370) INJECTION 76% COMPARISON:  Chest CT 08/09/2017 FINDINGS: CTA CHEST FINDINGS  Cardiovascular: No acute pulmonary embolus. Satisfactory opacification of the aorta and pulmonary arteries. Normal size heart without pericardial effusion. Normal branch pattern of the great vessels off the arch. No coronary arteriosclerosis. No aortic atherosclerosis. No aneurysm. No aortic dissection. Mediastinum/Nodes: No supraclavicular, axillary, mediastinal nor hilar lymphadenopathy. Trachea is patent and normal caliber without intraluminal abnormality. Esophagus is unremarkable. No thyromegaly is noted. Lungs/Pleura: Bilateral moderate-sized pleural effusions slightly smaller on the right with loculated component. Adjacent compressive atelectasis is noted. Patchy airspace opacities in the left upper lobe are redemonstrated, with slight improvement in aeration since prior though the vast majority of the pulmonary opacity still  persists. Musculoskeletal: Redemonstration of marked disc space narrowing C6-7 with endplate irregularity of the inferior endplate of T6 superior endplate of T7 with resultant mild focal kyphosis. Review of the MIP images confirms the above findings. CTA ABDOMEN AND PELVIS FINDINGS VASCULAR Aorta: Normal caliber aorta without aneurysm, dissection, vasculitis or significant stenosis. Celiac: Patent without evidence of aneurysm, dissection, vasculitis or significant stenosis. SMA: Patent without evidence of aneurysm, dissection, vasculitis or significant stenosis. Renals: Single renal arteries bilaterally without acute abnormality. No significant stenosis, dissection or evidence of fibromuscular dysplasia. IMA: Patent Inflow: No aneurysm or dissection. Minimal atherosclerotic plaque in the proximal right common iliac and both distal iliac arteries. No significant stenosis. No dissection or aneurysm. Veins: No obvious venous abnormality within the limitations of this arterial phase study. Review of the MIP images confirms the above findings. NON-VASCULAR Hepatobiliary: No focal liver  abnormality is seen. No gallstones, gallbladder wall thickening, or biliary dilatation. Pancreas: Unremarkable. No pancreatic ductal dilatation or surrounding inflammatory changes. Spleen: Heterogeneous enhancement likely due to timing of contrast bolus. No splenomegaly. Adrenals/Urinary Tract: No adrenal no renal masses. No nephrolithiasis nor obstructive uropathy. No hydroureteronephrosis. Unremarkable bladder. Stomach/Bowel: Nondistended stomach with normal small bowel rotation. Mild fluid-filled distention of small bowel loops with slight mural thickening of jejunum may reflect a mild small bowel enteritis. No mechanical source obstruction is seen. Colon is. Normal appearing appendix. Lymphatic: No abdominopelvic adenopathy. Reproductive: Normal size prostate and seminal vesicles. Other: Small amount of fluid in the left inguinal canal. Musculoskeletal: Plate and screw fixation of the left acetabulum. 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 No acute osseous abnormality. There is lower lumbar degenerative facet arthropathy. Minimal anterolisthesis of L4 on L5. Marked disc space narrowing at L5-S1. Review of the MIP images confirms the above findings. IMPRESSION: Chest CT: 1. No acute pulmonary embolus. 2. No aortic aneurysm or dissection. 3. Bilateral moderate-sized pleural effusions slightly smaller on the right with loculated component redemonstrated. Adjacent compressive atelectasis is seen both lower lobes. 4. Relatively unchanged patchy airspace opacity in the left upper lobe only minimally improved in aeration. 5. Redemonstration of focal kyphosis at T6-7 secondary to endplate irregularities and disc space narrowing suspicious for changes of discitis/osteomyelitis. MRI would be the study of choice for further correlation as to acuity. Abdomen and pelvic CT: 1. No aortic aneurysm or dissection. Patent branch vessels without significant stenosis or focal abnormality. 2. Mild fluid-filled distention  of small bowel which transmural thickening suspicious for changes of mild enteritis. No mechanical bowel obstruction. Electronically Signed   By: Tollie Eth M.D.   On: 08/14/2017 21:21   US Thoracentesis Asp Pleural Space W/img Guide  Result Date: 08/10/2017 INDICATION: Respiratory failure. Large right pleural effusion. Request for diagnostic and therapeutic thoracentesis. EXAM: ULTRASOUND GUIDED RIGHT THORACENTESIS MEDICATIONS: None. COMPLICATIONS: None immediate. PROCEDURE: An ultrasound guided thoracentesis was thoroughly discussed with the patient and questions answered. The benefits, risks, alternatives and complications were also discussed. The patient understands and wishes to proceed with the procedure. Written consent was obtained. Ultrasound was performed to localize and mark an adequate pocket of fluid in the right chest. The area was then prepped and draped in the normal sterile fashion. 1% Lidocaine was used for local anesthesia. Under ultrasound guidance a Safe-T-Centesis catheter was introduced. Thoracentesis was performed. The catheter was removed and a dressing applied. FINDINGS: A total of approximately 1.4 L of pink/blood-tinged fluid was removed. Samples were sent to the laboratory as requested by the clinical team. IMPRESSION: Successful ultrasound guided right  thoracentesis yielding 1.4 L of pleural fluid. Read by: Brayton El PA-C Electronically Signed   By: Corlis Leak M.D.   On: 08/10/2017 12:55     Oralia Manis, DO 08/20/2017, 11:20 AM PGY-1, [facility] FPTS Intern pager: [phone], text pages welcome

## 2017-08-20 NOTE — Progress Notes (Signed)
Patient ID: Marcus [name], male   DOB: 06/16/1965, 52 y.o.   MRN: [id number]         [facility]  Date of Admission:  08/09/2017    Total days of antibiotics 12        Day 10 cefazolin         ASSESSMENT: He was hospitalized in January with MSSA bacteremia complicated by mitral valve endocarditis, septic CNS emboli, meningitis, septic left knee and T6-7 discitis.  He completed 6 weeks of IV antibiotic therapy but is now back in the hospital with probable worsening of his mitral regurgitation, a persistent vegetation and persistent back pain.  Although all recent cultures have been negative I favor continuing treatment for probable persistent MSSA infection for now.  He now has increasing leukocytosis and has developed diarrhea.  He is certainly at risk for [name] colitis.  PLAN: 1. Continue cefazolin 2. Stool for [name] screen 3. Contact precautions  Principal Problem:   Severe mitral regurgitation Active Problems:   Endocarditis of mitral valve   Acute heart failure (HCC)   HCAP (healthcare-associated pneumonia)   Thoracic discitis   Hepatitis C antibody positive in blood   Sepsis (HCC)   Pleural effusion   Protein-calorie malnutrition, severe   Loose stools   Scheduled Meds: . feeding supplement (ENSURE ENLIVE)  237 mL Oral TID BM  . folic acid  1 mg Oral Daily  . [START ON 08/21/2017] furosemide  40 mg Intravenous Daily  . LORazepam  1 mg Intravenous Once  . multivitamin with minerals  1 tablet Oral Daily  . mupirocin ointment   Nasal BID  . oxyCODONE-acetaminophen  1 tablet Oral Q6H  . potassium chloride  20 mEq Oral Daily  . sodium chloride flush  3 mL Intravenous Q12H  . thiamine  100 mg Oral Daily   Or  . thiamine  100 mg Intravenous Daily   Continuous Infusions: . sodium chloride    .  ceFAZolin (ANCEF) IV Stopped (08/20/17 0900)   PRN Meds:.sodium chloride, albuterol, ondansetron (ZOFRAN) IV, sodium chloride  flush   SUBJECTIVE: He is feeling worse today.  His back pain is unchanged.  He began having watery diarrhea last night.  Review of Systems: Review of Systems  Constitutional: Positive for malaise/fatigue and weight loss. Negative for chills, diaphoresis and fever.  HENT: Negative for congestion and sore throat.   Respiratory: Negative for cough, sputum production and shortness of breath.   Cardiovascular: Negative for chest pain.  Gastrointestinal: Positive for diarrhea. Negative for abdominal pain, nausea and vomiting.  Genitourinary: Negative for dysuria.  Musculoskeletal: Positive for back pain. Negative for joint pain.  Skin: Negative for rash.  Neurological: Negative for dizziness and headaches.    No Known Allergies  OBJECTIVE: Vitals:   08/19/17 1544 08/19/17 1620 08/19/17 2021 08/20/17 0446  BP: (!) 93/52 93/67 103/71 112/66  Pulse: 96 91 88 92  Resp: 20 17 (!) 26 20  Temp: 97.6 F (36.4 C) 97.9 F (36.6 C) 98.3 F (36.8 C) 99 F (37.2 C)  TempSrc:  Oral Oral Oral  SpO2: 95% 96% 97% 96%  Weight:    110 lb 3.7 oz (50 kg)  Height:       Body mass index is 15.37 kg/m.  Physical Exam  Constitutional: He is oriented to person, place, and time.  He appears worried today.  His parents and brother are visiting.  HENT:  Poor dentition.  Cardiovascular: Normal rate and  regular rhythm.  Murmur heard. 2/6 systolic murmur.  Pulmonary/Chest: Effort normal. He has no wheezes. He has no rales.  Slight kyphotic deformity and mid thoracic spine.  Abdominal: Soft. There is no tenderness. There is no rebound.  Normal, active bowel sounds.  Neurological: He is alert and oriented to person, place, and time.  Skin: No rash noted.  Psychiatric: Mood and affect normal.    Lab Results Lab Results  Component Value Date   WBC 22.4 (H) 08/20/2017   HGB 9.4 (L) 08/20/2017   HCT 30.3 (L) 08/20/2017   MCV 89.9 08/20/2017   PLT 284 08/20/2017    Lab Results  Component Value  Date   CREATININE 1.06 08/20/2017   BUN 18 08/20/2017   NA 132 (L) 08/20/2017   K 3.7 08/20/2017   CL 97 (L) 08/20/2017   CO2 24 08/20/2017    Lab Results  Component Value Date   ALT <5 (L) 08/16/2017   AST 20 08/16/2017   ALKPHOS 101 08/16/2017   BILITOT 0.4 08/16/2017     Microbiology: Recent Results (from the past 240 hour(s))  Fungus Culture With Stain     Status: None (Preliminary result)   Collection Time: 08/10/17 12:59 PM  Result Value Ref Range Status   Fungus Stain Final report  Final    Comment: (NOTE) Performed At: [facility] [address] [place], [place] [id number] Jolene Schimke MD NW:[id number]    Fungus (Mycology) Culture PENDING  Incomplete   Fungal Source PLEURAL  Final    Comment: RIGHT Performed at [facility], [address] [place], [place] [address]   Acid Fast Smear (AFB)     Status: None   Collection Time: 08/10/17 12:59 PM  Result Value Ref Range Status   AFB Specimen Processing Concentration  Final   Acid Fast Smear Negative  Final    Comment: (NOTE) Performed At: [facility] [address] [place], [place] [id number] Jolene Schimke MD LK:[id number]    Source (AFB) PLEURAL  Final    Comment: RIGHT Performed at [facility], [address], [place], [place] [address]   Culture, body fluid-bottle     Status: None   Collection Time: 08/10/17 12:59 PM  Result Value Ref Range Status   Specimen Description PLEURAL RIGHT  Final   Special Requests NONE  Final   Culture   Final    NO GROWTH 5 DAYS Performed at [facility], [address], [place], [place] [address]    Report Status 08/15/2017 FINAL  Final  Gram stain     Status: None   Collection Time: 08/10/17 12:59 PM  Result Value Ref Range Status   Specimen Description PLEURAL RIGHT  Final   Special Requests NONE  Final   Gram Stain   Final    FEW WBC PRESENT,BOTH PMN AND MONONUCLEAR NO ORGANISMS SEEN Performed at [facility],  [address] [place], [place] [address]    Report Status 08/10/2017 FINAL  Final  Fungus Culture Result     Status: None   Collection Time: 08/10/17 12:59 PM  Result Value Ref Range Status   Result 1 Comment  Final    Comment: (NOTE) KOH/Calcofluor preparation:  no fungus observed. Performed At: [facility] [address] [place], [place] [id number] Jolene Schimke MD JJ:[id number] Performed at [facility], [address], [place], [place] [address]   MRSA PCR Screening     Status: Abnormal  Collection Time: 08/11/17  6:18 AM  Result Value Ref Range Status   MRSA by PCR POSITIVE (A) NEGATIVE Final    Comment:        The GeneXpert MRSA Assay (FDA approved for NASAL specimens only), is one component of a comprehensive MRSA colonization surveillance program. It is not intended to diagnose MRSA infection nor to guide or monitor treatment for MRSA infections. RESULT CALLED TO, READ BACK BY AND VERIFIED WITH: RN Osa Craver [id number] [phone] MLM Performed at [facility], [address] [place], [place] [address]   Culture, blood (routine x 2)     Status: None (Preliminary result)   Collection Time: 08/15/17 10:54 AM  Result Value Ref Range Status   Specimen Description BLOOD RIGHT HAND  Final   Special Requests   Final    BOTTLES DRAWN AEROBIC AND ANAEROBIC Blood Culture results may not be optimal due to an inadequate volume of blood received in culture bottles   Culture   Final    NO GROWTH 4 DAYS Performed at [facility], [address], [place], [place] [address]    Report Status PENDING  Incomplete  Culture, blood (routine x 2)     Status: None (Preliminary result)   Collection Time: 08/15/17 10:54 AM  Result Value Ref Range Status   Specimen Description BLOOD LEFT HAND  Final   Special Requests   Final    BOTTLES DRAWN AEROBIC AND ANAEROBIC Blood Culture adequate volume   Culture   Final    NO GROWTH 4 DAYS Performed at [facility],  [address], [place], [place] [address]    Report Status PENDING  Incomplete    Cliffton Asters, MD [facility] [phone] pager   [phone] cell 08/20/2017, 12:06 PM

## 2017-08-20 NOTE — Progress Notes (Signed)
I met with Marcus [name], his Mom and Dad, as well as brother at bedside. I discussed the limitations of a CIR admit to include that we are short term rehab with the inability to keep him until completion of his IV antibiotics on 09/24/2017. We would not recommend he discharge home with a PICC line due to his history. Mom states Marcus [name] lives alone, but family would try to provide 24/7 care for about a month after d/c. I stated that he would need 24/7 assist for several months due to his overall debility, need for nutrition, med management etc. Family requesting that he not return to [place] due to distance, but I also discussed that there were limitations for bed availability due to his lack of insurance coverage. Mom states he was d/c'd from [facility] due to her concerns for his care there and that he was sent out with his PICC that dislodged itself after discharge. I again repeated that Dr. Naaman Plummer has not yet agreed to admission and that SNF likely to be our recommendation. Patient has been home 2 days after leaving SNF before readmission to hospital. I discussed with RN CM and SW. I will follow at a distance at this time. [phone]

## 2017-08-20 NOTE — Progress Notes (Signed)
Pt has had multiple loose bms today with c/o cramping, nausea and not feeling well in general, unable to take oral medications d/t nausea, stool specimen sent to lab, attending physician aware, enteric precautions intitiated. Raymon MuttonGwen Annamay Laymon RN

## 2017-08-20 NOTE — Progress Notes (Signed)
PT Cancellation Note  Patient Details Name: Kern ReapBryon T Newcom MRN: [id number] DOB: 10/22/1965   Cancelled Treatment:    Reason Eval/Treat Not Completed: Patient declined, no reason specified.  Pt having diarrhea all day and doesn't feel like doing anything. 08/20/2017  [place] BingKen Rickelle Sylvestre, PT [phone]  (pager)   Eliseo GumKenneth V Baylen Buckner 08/20/2017, 4:18 PM

## 2017-08-20 NOTE — Progress Notes (Signed)
Progress Note  Patient Name: Marcus [name] Date of Encounter: 08/20/2017  Primary Cardiologist: Armanda Magicraci Turner, MD   Subjective   No dyspnea; no chest pain; mild abdominal and back pain  Inpatient Medications    Scheduled Meds: . feeding supplement (ENSURE ENLIVE)  237 mL Oral TID BM  . folic acid  1 mg Oral Daily  . furosemide  40 mg Intravenous BID  . LORazepam  1 mg Intravenous Once  . multivitamin with minerals  1 tablet Oral Daily  . mupirocin ointment   Nasal BID  . oxyCODONE-acetaminophen  1 tablet Oral Q6H  . sodium chloride flush  3 mL Intravenous Q12H  . thiamine  100 mg Oral Daily   Or  . thiamine  100 mg Intravenous Daily   Continuous Infusions: . sodium chloride    .  ceFAZolin (ANCEF) IV 2 g (08/20/17 0828)   PRN Meds: sodium chloride, acetaminophen **OR** acetaminophen (TYLENOL) oral liquid 160 mg/5 mL, albuterol, fentaNYL (SUBLIMAZE) injection, ketorolac, meperidine (DEMEROL) injection, ondansetron (ZOFRAN) IV, ondansetron (ZOFRAN) IV, oxyCODONE **OR** oxyCODONE, sodium chloride flush   Vital Signs    Vitals:   08/19/17 1544 08/19/17 1620 08/19/17 2021 08/20/17 0446  BP: (!) 93/52 93/67 103/71 112/66  Pulse: 96 91 88 92  Resp: 20 17 (!) 26 20  Temp: 97.6 F (36.4 C) 97.9 F (36.6 C) 98.3 F (36.8 C) 99 F (37.2 C)  TempSrc:  Oral Oral Oral  SpO2: 95% 96% 97% 96%  Weight:    110 lb 3.7 oz (50 kg)  Height:        Intake/Output Summary (Last 24 hours) at 08/20/2017 0848 Last data filed at 08/20/2017 0458 Gross per 24 hour  Intake 1041.25 ml  Output 500 ml  Net 541.25 ml   Filed Weights   08/18/17 0445 08/19/17 0550 08/20/17 0446  Weight: 125 lb 6.4 oz (56.9 kg) 106 lb 4.2 oz (48.2 kg) 110 lb 3.7 oz (50 kg)    Telemetry    NSR with couplet- Personally Reviewed   Physical Exam   GEN: WD chronically ill appearing Neck: supple Cardiac: RRR, 3/6 systolic murmur apex Respiratory: Diminished BS RLL; no wheeze GI: Soft, nontender,  non-distended, no masses MS: No edema Neuro:  Grossly intact   Labs    Chemistry Recent Labs  Lab 08/14/17 0531 08/15/17 1054 08/16/17 0357  08/18/17 0605 08/19/17 0340 08/19/17 0952 08/20/17 0319  NA 136 136 132*   < > 130* 128*  --  132*  K 3.9 4.2 3.9   < > 3.8 3.2*  --  3.7  CL 99* 101 95*   < > 91* 88*  --  97*  CO2 28 26 26    < > 28 27  --  24  GLUCOSE 98 124* 95   < > 94 89  --  132*  BUN 16 14 15    < > 19 22*  --  18  CREATININE 1.07 1.16 1.15   < > 1.15 1.23 1.10 1.06  CALCIUM 8.4* 8.5* 8.4*   < > 8.6* 8.4*  --  8.1*  PROT 6.8 6.7 6.8  --   --   --   --   --   ALBUMIN 2.2* 2.2* 2.3*  --   --   --   --   --   AST 16 22 20   --   --   --   --   --   ALT <5* <5* <5*  --   --   --   --   --  ALKPHOS 96 88 101  --   --   --   --   --   BILITOT 0.7 0.5 0.4  --   --   --   --   --   GFRNONAA >60 >60 >60   < > >60 >60 >60 >60  GFRAA >60 >60 >60   < > >60 >60 >60 >60  ANIONGAP 9 9 11    < > 11 13  --  11   < > = values in this interval not displayed.     Hematology Recent Labs  Lab 08/19/17 0340 08/19/17 0952 08/20/17 0319  WBC 15.9* 14.3* 22.4*  RBC 3.47* 3.34* 3.37*  HGB 9.6* 9.5* 9.4*  HCT 30.4* 29.8* 30.3*  MCV 87.6 89.2 89.9  MCH 27.7 28.4 27.9  MCHC 31.6 31.9 31.0  RDW 14.4 14.6 14.7  PLT 321 265 284     Patient Profile     52 y.o. 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 Continue antibiotics.  Patient has had dental consult and teeth extraction planned for later this week.  Cath has been performed. Patient has been seen by Dr. Cornelius Moras with possible plans for mitral valve replacement if he improves; needs nutrition and physical therapy.  2 acute diastolic congestive heart failure-improved; continue lasix but decrease to 40 mg daily and follow.  3 severe mitral regurgitation-plan as  outlined above.  4 severe malnutrition-needs improved nutrition and physical therapy prior to MVR.  5 substance abuse-patient counseled on avoiding.  For questions or updates, please contact [facility] Please consult [email or web address] for contact info under Cardiology/STEMI.      Signed, Olga Millers, MD  08/20/2017, 8:48 AM

## 2017-08-20 NOTE — Progress Notes (Signed)
OT Cancellation Note  Patient Details Name: Marcus [name] MRN: [id number] DOB: 02/04/1966   Cancelled Treatment:    Reason Eval/Treat Not Completed: Other (comment)(pt c/o diarrhea and declining therapy at this time). Will follow up as time allows.  Gaye AlkenBailey A Malichi Palardy M.S., OTR/L Pager: [phone]  08/20/2017, 2:45 PM

## 2017-08-20 NOTE — Progress Notes (Signed)
Discussed with ID patient's finding of osteomyelitis. Appears to be pat of the old infection. They recommend no C-Spine MRI, as this would not change treatment plan. Also ID reports that this finding should not stop patient from having surgery.  Orit Sanville, [place], DO 08/20/2017, 2:24 PM PGY-1, [facility] pager [phone]

## 2017-08-21 ENCOUNTER — Encounter (HOSPITAL_COMMUNITY)
Admission: EM | Disposition: A | Discharge status: Home Health | Payer: Self-pay | Source: Home / Self Care | Attending: Family Medicine

## 2017-08-21 ENCOUNTER — Inpatient Hospital Stay (HOSPITAL_COMMUNITY): Payer: Medicaid Other | Admitting: Certified Registered"

## 2017-08-21 DIAGNOSIS — A0472 Enterocolitis due to Clostridium difficile, not specified as recurrent: Secondary | ICD-10-CM

## 2017-08-21 DIAGNOSIS — M869 Osteomyelitis, unspecified: Secondary | ICD-10-CM

## 2017-08-21 HISTORY — PX: MULTIPLE EXTRACTIONS WITH ALVEOLOPLASTY: SHX5342

## 2017-08-21 LAB — CBC
HCT: 30.6 % — ABNORMAL LOW (ref 39.0–52.0)
Hemoglobin: 9.9 g/dL — ABNORMAL LOW (ref 13.0–17.0)
MCH: 28.7 pg (ref 26.0–34.0)
MCHC: 32.4 g/dL (ref 30.0–36.0)
MCV: 88.7 fL (ref 78.0–100.0)
PLATELETS: 286 10*3/uL (ref 150–400)
RBC: 3.45 MIL/uL — ABNORMAL LOW (ref 4.22–5.81)
RDW: 14.8 % (ref 11.5–15.5)
WBC: 26.2 10*3/uL — ABNORMAL HIGH (ref 4.0–10.5)

## 2017-08-21 LAB — BASIC METABOLIC PANEL
Anion gap: 12 (ref 5–15)
BUN: 16 mg/dL (ref 6–20)
CALCIUM: 8.2 mg/dL — AB (ref 8.9–10.3)
CO2: 23 mmol/L (ref 22–32)
CREATININE: 1.02 mg/dL (ref 0.61–1.24)
Chloride: 93 mmol/L — ABNORMAL LOW (ref 101–111)
GFR calc Af Amer: >60 mL/min (ref >60)
GLUCOSE: 114 mg/dL — AB (ref 65–99)
Potassium: 3.2 mmol/L — ABNORMAL LOW (ref 3.5–5.1)
Sodium: 128 mmol/L — ABNORMAL LOW (ref 135–145)

## 2017-08-21 SURGERY — MULTIPLE EXTRACTION WITH ALVEOLOPLASTY
Anesthesia: General

## 2017-08-21 MED ORDER — LIDOCAINE HCL (CARDIAC) 20 MG/ML IV SOLN
INTRAVENOUS | Status: AC
  Filled 2017-08-21: qty 5

## 2017-08-21 MED ORDER — BUPIVACAINE-EPINEPHRINE 0.5% -1:200000 IJ SOLN
INTRAMUSCULAR | Status: DC | PRN
  Administered 2017-08-21: 3.6 mL

## 2017-08-21 MED ORDER — MIDAZOLAM HCL 2 MG/2ML IJ SOLN
INTRAMUSCULAR | Status: AC
  Filled 2017-08-21: qty 2

## 2017-08-21 MED ORDER — PROPOFOL 10 MG/ML IV BOLUS
INTRAVENOUS | Status: AC
  Filled 2017-08-21: qty 20

## 2017-08-21 MED ORDER — OXYCODONE-ACETAMINOPHEN 5-325 MG PO TABS
1.0000 | ORAL_TABLET | Freq: Four times a day (QID) | ORAL | Status: DC
  Administered 2017-08-21 – 2017-08-23 (×9): 1 via ORAL
  Filled 2017-08-21 (×9): qty 1

## 2017-08-21 MED ORDER — FENTANYL CITRATE (PF) 250 MCG/5ML IJ SOLN
INTRAMUSCULAR | Status: DC | PRN
  Administered 2017-08-21: 100 ug via INTRAVENOUS

## 2017-08-21 MED ORDER — LIDOCAINE-EPINEPHRINE 2 %-1:100000 IJ SOLN
INTRAMUSCULAR | Status: DC | PRN
  Administered 2017-08-21: 10.2 mL via INTRADERMAL

## 2017-08-21 MED ORDER — OXYMETAZOLINE HCL 0.05 % NA SOLN
NASAL | Status: AC
  Filled 2017-08-21: qty 15

## 2017-08-21 MED ORDER — PHENYLEPHRINE 40 MCG/ML (10ML) SYRINGE FOR IV PUSH (FOR BLOOD PRESSURE SUPPORT)
PREFILLED_SYRINGE | INTRAVENOUS | Status: DC | PRN
  Administered 2017-08-21: 80 ug via INTRAVENOUS
  Administered 2017-08-21: 40 ug via INTRAVENOUS

## 2017-08-21 MED ORDER — SUCCINYLCHOLINE CHLORIDE 200 MG/10ML IV SOSY
PREFILLED_SYRINGE | INTRAVENOUS | Status: AC
  Filled 2017-08-21: qty 10

## 2017-08-21 MED ORDER — ONDANSETRON HCL 4 MG/2ML IJ SOLN
INTRAMUSCULAR | Status: DC | PRN
  Administered 2017-08-21: 4 mg via INTRAVENOUS

## 2017-08-21 MED ORDER — ROCURONIUM BROMIDE 10 MG/ML (PF) SYRINGE
PREFILLED_SYRINGE | INTRAVENOUS | Status: AC
  Filled 2017-08-21: qty 5

## 2017-08-21 MED ORDER — 0.9 % SODIUM CHLORIDE (POUR BTL) OPTIME
TOPICAL | Status: DC | PRN
  Administered 2017-08-21: 1000 mL

## 2017-08-21 MED ORDER — PHENYLEPHRINE 40 MCG/ML (10ML) SYRINGE FOR IV PUSH (FOR BLOOD PRESSURE SUPPORT)
PREFILLED_SYRINGE | INTRAVENOUS | Status: AC
  Filled 2017-08-21: qty 10

## 2017-08-21 MED ORDER — FENTANYL CITRATE (PF) 250 MCG/5ML IJ SOLN
INTRAMUSCULAR | Status: AC
  Filled 2017-08-21: qty 5

## 2017-08-21 MED ORDER — ONDANSETRON HCL 4 MG/2ML IJ SOLN
INTRAMUSCULAR | Status: AC
  Filled 2017-08-21: qty 2

## 2017-08-21 MED ORDER — SUGAMMADEX SODIUM 200 MG/2ML IV SOLN
INTRAVENOUS | Status: DC | PRN
  Administered 2017-08-21: 113.2 mg via INTRAVENOUS

## 2017-08-21 MED ORDER — LACTATED RINGERS IV SOLN
INTRAVENOUS | Status: DC | PRN
  Administered 2017-08-21 (×2): via INTRAVENOUS

## 2017-08-21 MED ORDER — FENTANYL CITRATE (PF) 100 MCG/2ML IJ SOLN: 25.0000 ug | INTRAMUSCULAR | Status: DC | PRN

## 2017-08-21 MED ORDER — PROPOFOL 10 MG/ML IV BOLUS
INTRAVENOUS | Status: DC | PRN
  Administered 2017-08-21: 100 mg via INTRAVENOUS

## 2017-08-21 MED ORDER — DEXAMETHASONE SODIUM PHOSPHATE 10 MG/ML IJ SOLN
INTRAMUSCULAR | Status: DC | PRN
  Administered 2017-08-21: 10 mg via INTRAVENOUS

## 2017-08-21 MED ORDER — LACTATED RINGERS IV SOLN
INTRAVENOUS | Status: DC
  Administered 2017-08-21: 13:00:00 via INTRAVENOUS

## 2017-08-21 MED ORDER — DEXAMETHASONE SODIUM PHOSPHATE 10 MG/ML IJ SOLN
INTRAMUSCULAR | Status: AC
  Filled 2017-08-21: qty 1

## 2017-08-21 MED ORDER — SUGAMMADEX SODIUM 200 MG/2ML IV SOLN
INTRAVENOUS | Status: AC
  Filled 2017-08-21: qty 2

## 2017-08-21 MED ORDER — ROCURONIUM BROMIDE 10 MG/ML (PF) SYRINGE
PREFILLED_SYRINGE | INTRAVENOUS | Status: DC | PRN
  Administered 2017-08-21: 50 mg via INTRAVENOUS
  Administered 2017-08-21 (×2): 20 mg via INTRAVENOUS

## 2017-08-21 MED ORDER — MIDAZOLAM HCL 5 MG/5ML IJ SOLN
INTRAMUSCULAR | Status: DC | PRN
  Administered 2017-08-21: 2 mg via INTRAVENOUS

## 2017-08-21 MED ORDER — LIDOCAINE 2% (20 MG/ML) 5 ML SYRINGE
INTRAMUSCULAR | Status: DC | PRN
  Administered 2017-08-21: 60 mg via INTRAVENOUS

## 2017-08-21 MED ORDER — BUPIVACAINE-EPINEPHRINE (PF) 0.5% -1:200000 IJ SOLN
INTRAMUSCULAR | Status: AC
  Filled 2017-08-21: qty 7.2

## 2017-08-21 MED ORDER — ENOXAPARIN SODIUM 40 MG/0.4ML ~~LOC~~ SOLN
40.0000 mg | SUBCUTANEOUS | Status: DC
  Administered 2017-08-22 – 2017-08-23 (×2): 40 mg via SUBCUTANEOUS
  Filled 2017-08-21 (×2): qty 0.4

## 2017-08-21 MED ORDER — AMINOCAPROIC ACID SOLUTION 5% (50 MG/ML)
10.0000 mL | ORAL | Status: AC
  Administered 2017-08-21 (×4): 10 mL via ORAL
  Filled 2017-08-21 (×2): qty 100

## 2017-08-21 MED ORDER — PHENYLEPHRINE HCL 10 MG/ML IJ SOLN
INTRAMUSCULAR | Status: DC | PRN
  Administered 2017-08-21: 25 ug/min via INTRAVENOUS

## 2017-08-21 MED ORDER — POTASSIUM CHLORIDE CRYS ER 20 MEQ PO TBCR: 40.0000 meq | EXTENDED_RELEASE_TABLET | Freq: Once | ORAL | Status: DC

## 2017-08-21 SURGICAL SUPPLY — 37 items
ALCOHOL 70% 16 OZ (MISCELLANEOUS) ×2 IMPLANT
ATTRACTOMAT 16X20 MAGNETIC DRP (DRAPES) ×2 IMPLANT
BLADE SURG 15 STRL LF DISP TIS (BLADE) ×2 IMPLANT
BLADE SURG 15 STRL SS (BLADE) ×4
COVER SURGICAL LIGHT HANDLE (MISCELLANEOUS) ×2 IMPLANT
GAUZE PACKING FOLDED 2  STR (GAUZE/BANDAGES/DRESSINGS) ×1
GAUZE PACKING FOLDED 2 STR (GAUZE/BANDAGES/DRESSINGS) ×1 IMPLANT
GAUZE SPONGE 4X4 16PLY XRAY LF (GAUZE/BANDAGES/DRESSINGS) ×3 IMPLANT
GLOVE BIO SURGEON STRL SZ 6.5 (GLOVE) ×3 IMPLANT
GLOVE SURG ORTHO 8.0 STRL STRW (GLOVE) ×2 IMPLANT
GOWN STRL REUS W/ TWL LRG LVL3 (GOWN DISPOSABLE) ×1 IMPLANT
GOWN STRL REUS W/TWL 2XL LVL3 (GOWN DISPOSABLE) ×2 IMPLANT
GOWN STRL REUS W/TWL LRG LVL3 (GOWN DISPOSABLE) ×2
HEMOSTAT SURGICEL 2X14 (HEMOSTASIS) ×2 IMPLANT
KIT BASIN OR (CUSTOM PROCEDURE TRAY) ×2 IMPLANT
KIT TURNOVER KIT B (KITS) ×2 IMPLANT
MANIFOLD NEPTUNE WASTE (CANNULA) ×2 IMPLANT
NDL BLUNT 16X1.5 OR ONLY (NEEDLE) ×1 IMPLANT
NDL DENTAL 27 LONG (NEEDLE) ×2 IMPLANT
NEEDLE BLUNT 16X1.5 OR ONLY (NEEDLE) ×2 IMPLANT
NEEDLE DENTAL 27 LONG (NEEDLE) ×4 IMPLANT
NS IRRIG 1000ML POUR BTL (IV SOLUTION) ×2 IMPLANT
PACK EENT II TURBAN DRAPE (CUSTOM PROCEDURE TRAY) ×2 IMPLANT
PAD ARMBOARD 7.5X6 YLW CONV (MISCELLANEOUS) ×2 IMPLANT
SPONGE SURGIFOAM ABS GEL 100 (HEMOSTASIS) IMPLANT
SPONGE SURGIFOAM ABS GEL 12-7 (HEMOSTASIS) IMPLANT
SPONGE SURGIFOAM ABS GEL SZ50 (HEMOSTASIS) IMPLANT
SUCTION FRAZIER HANDLE 10FR (MISCELLANEOUS) ×1
SUCTION TUBE FRAZIER 10FR DISP (MISCELLANEOUS) ×1 IMPLANT
SUT CHROMIC 3 0 PS 2 (SUTURE) ×9 IMPLANT
SUT CHROMIC 4 0 P 3 18 (SUTURE) ×1 IMPLANT
SYR 50ML SLIP (SYRINGE) ×2 IMPLANT
TOWEL NATURAL 10PK STERILE (DISPOSABLE) ×2 IMPLANT
TUBE CONNECTING 12X1/4 (SUCTIONS) ×2 IMPLANT
WATER STERILE IRR 1000ML POUR (IV SOLUTION) ×2 IMPLANT
WATER TABLETS ICX (MISCELLANEOUS) ×2 IMPLANT
YANKAUER SUCT BULB TIP NO VENT (SUCTIONS) ×2 IMPLANT

## 2017-08-21 NOTE — Progress Notes (Signed)
PRE-OPERATIVE NOTE:  08/21/2017 Kern ReapBryon T Lookingbill [id number]  VITALS: BP 95/63 (BP Location: Right Arm)   Pulse 91   Temp 98.8 F (37.1 C) (Oral)   Resp 18   Ht 5\' 11"  (1.803 m)   Wt 124 lb 12.5 oz (56.6 kg)   SpO2 96%   BMI 17.40 kg/m   Lab Results  Component Value Date   WBC 26.2 (H) 08/21/2017   HGB 9.9 (L) 08/21/2017   HCT 30.6 (L) 08/21/2017   MCV 88.7 08/21/2017   PLT 286 08/21/2017   BMET    Component Value Date/Time   NA 128 (L) 08/21/2017 0407   K 3.2 (L) 08/21/2017 0407   CL 93 (L) 08/21/2017 0407   CO2 23 08/21/2017 0407   GLUCOSE 114 (H) 08/21/2017 0407   BUN 16 08/21/2017 0407   CREATININE 1.02 08/21/2017 0407   CALCIUM 8.2 (L) 08/21/2017 0407   GFRNONAA >60 08/21/2017 0407   GFRAA >60 08/21/2017 0407    Lab Results  Component Value Date   INR 1.10 08/19/2017   INR 1.20 08/10/2017   INR 1.15 08/09/2017   No results found for: PTT   Kern ReapBryon T Smelcer presents for multiple extractions with alveoloplasty in the operating room with general anesthesia.    SUBJECTIVE: The patient denies any acute medical or dental changes and agrees to proceed with treatment as planned.  EXAM: No sign of acute dental changes.  ASSESSMENT: Patient is affected by chronic apical periodontitis, dental caries, multiple retained root segments, chronic periodontitis, and loose teeth.  PLAN: Patient agrees to proceed with treatment as planned in the operating room as previously discussed and accepts the risks, benefits, and complications of the proposed treatment. Patient is aware of the risk for bleeding, bruising, swelling, infection, pain, nerve damage, soft tissue damage, sinus involvement, root tip fracture, mandible fracture, and the risks of complications associated with the anesthesia. Patient also is aware of the potential for other complications up to and including death due to his overall cardiovascular and respiratory compromise.     Charlynne Panderonald F. Kulinski, DDS

## 2017-08-21 NOTE — Care Management Note (Addendum)
Case Management Note  Patient Details  Name: Marcus [name] MRN: [id number] Date of Birth: 05/08/1966  Subjective/Objective:  Pt presented for Sepsis 2/2 HAP and New Onset CHF. Worsening of Mitral Regurgitation and Persistent Vegetation-ID following. Plan for dental extractions 08-21-17. Pt initially from [facility] on a Letter of Guarantee (LOG)  from the Hospital and pt left. CSW following for disposition needs. CIR is following as well. Pt has support of mother and father.                Action/Plan: Pt continues on IV Ancef until 09-24-17. Pt was discussed in the QC meeting. Pt may be approved for LOG- will continue to monitor.   Expected Discharge Date:                Expected Discharge Plan:  Skilled Nursing Facility  In-House Referral:  Clinical Social Work  Discharge planning Services  CM Consult  Post Acute Care Choice:  Home with Home Health, Durable Medical Equipment.   Choice offered to:  Patient, Parent  DME Arranged:  Hospital Bed DME Agency:  [facility]  [facility]  [facility]  Status of Service: Completed  If discussed at Long Length of Stay Meetings, dates discussed:  08-12-17, 08-14-17, 08-19-17, 08-21-17, 08-26-17  Additional Comments: 1025 08-27-17 Tomi BambergerBrenda Graves-Bigelow, RN,BSN [phone] CM did call Mom Marcus ChromanVictoria [name] in regards to discharge plan for son Marcus SartoriusByron [name]. Mom was agitated that pt is being d/c home today 2/2 her husband is having surgery and she has to get him home as well. CM did try to be compassionate to the moms needs and offer other alternatives. CM did state that CAB could take patient to his address to pick up meds and maybe and his father could assist. Mom did not agree with this idea. CM also offered to call his siblings to see if they can assist and Mom stated believe it or not all of our family is having issues at this time. Mom was furious that pt was being d/c today and CM had discussed planning on 08-26-17.  Plan will be for d/c home 08-27-17- Staff RN to call his mother at time of d/c so she can transport patient home. Mother refused Hospital Bed and she stated she will get the patient a lift chair. No further needs from CM at this time.    [id number] 08-27-17 Tomi BambergerBrenda Graves-Bigelow, RN,BSN [phone] [facility] CM did call [facility] and [facility]. [facility] will check the cost to rent a Hospital Bed and will get back to CM.  CM did call [employer] to make sure that Medication Dificid has been delivered. [facility] Reference # [id number]. Pharmacy Approved and medication will be delivered today via [employer]. No delivery time known. Mother will have to keep checking his address. Hospital Bed will cost $128.00 a month- Order will need to be placed. No further needs from CM at this time.     1622 08-26-17 Tomi BambergerBrenda Graves-Bigelow, RN,BSN [phone] CM did speak with [employer] Rep and pt has been approved for Dificid- CM did fax information to [facility] and medication will be mailed overnight to patient's address. CM will mail information to  Po Box. CM [facility]- [facility] assesisng to see if pt qualifies for charity care. [facility]. Mom notified of plan of care  adnd plan will be for home @ his mothers address [address] [place] [address]. Per mtoher she has a 3n1, WC and RW. Mom wants a hospital Bed- [facility] is checking the price for rental for bed. Pt and mom aware that d/c day will be 08-27-17 and that medication will be sent to patient's address and someone will need to pick them up. Other medications can be sent to [facility]. Pt will be [facility] with [facility] once stable for d/c. No further needs from CM at this time.     1552 08-26-17 Tomi Bamberger, RN, BSN [phone] CM did fax information to [employer] for patient assistance application. Awaiting to hear back from Company in regards to cost. Pt  is not a candidate for CIR or SNF. Plan will be for home once stable -CM did contact [facility] to see if pt can be approved for [facility] for Home PT Services.     08-26-17 1328 Tomi Bamberger, [place] [phone] Hospital follow up scheduled at the [facility]. CM did call the [facility] to check the price for Dificid-cost will be $7431.00. CM did call the [facility]- and spoke with Marcus [name] in the Pharmacy- she was able to send me information for the patient assistance [employer]. CM will ask MD to assist with Application in order to fax to company for fast track process to see if company can provide assistance. CM did call patient's mother and she wants CIR for 2 weeks- CM did explain that this is not an option and pt is not a candidate for CIR due to pt will not be able to tolerate the intensity of the Short Term Rehab. Pt's only option for SNF was [facility] when he was on IV antibiotic therapy. Therapy has been changed to po and not sure if he will be eligible for LOG for additional SNF. Mom is aware that if hospital can not do a LOG that the plan will be for home - mom has to decide if pt can come to her home vs his dads home or return to his address. CM & CSW working together to get pt safely transitioned out of the hospital.     08-26-17 1112 Tomi Bamberger, [place] [phone] Pt was discussed in the QC Meeting this am. ID following and converted IV antibiotics to Oral Cephalexin x 11 days and fidaxomicin (DIFICID) tablet 200 mg for 18 more days per ID notes.  CM will work with MD in regards to Rx's and try to assist with purchasing medications for patient. Pt is without PCP- CM did reach out to the [facility] to see if any new patient appointments available. [facility] may be willing to accept the patient at the clinic. [facility]- he will be able to get medications from  the [facility] and medications will range from $4.00-$10.00. Plan will be for home. CM will reach out to the patient's mom for additional support.   08-22-17 1503 Tomi Bamberger, RN,BSN [phone] CM did speak with CIR- liaison Britta Mccreedy pt is not a candidate for CIR at this time. Plan will need to be for SNF. CSW assisting with disposition needs.  Gala Lewandowsky, RN 08/21/2017, 3:21 PM

## 2017-08-21 NOTE — Progress Notes (Signed)
Family Medicine Teaching Service Daily Progress Note Intern Pager: [phone]  Patient name: Marcus [name] Medical record number: [id number] Date of birth: [birth year] Age: 52 y.o. Gender: male  Primary Care Provider: Patient, No Pcp Per Consultants: CCM, ID, Cardiology, CVTS, Dental  Code Status: Full   Pt Overview and Major Events to Date:  3/09: Admit forsepsis thought to be secondary to HAP and new onsetHF,cultures obtained, broad-spectrum antibiotics initiated 3/10: PCCM consulted,tolerating BiPAP 3/10: thoracentesis 3/14 TEE with severe MR, CVTS consulted  3/21 plan for full dental extraction  Assessment and Plan: Marcus [name] a 52 y.o.malepresenting with worsening SOB. 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  Endocarditis of mitral valve h/oembolic strokeand metabolic encephalopathy new onset heart failure Patient does have signs concerning for new onset heart failure 2/2 vavular dysfunction.Leukocytosis continuing to worsen to 26.2 -cardiology consulted, appreciate recommendations:lasix 40mg  daily -daily weight -strict Is and Os -CVTS consulted, appreciate recommendations  -continue ancef (3/15-) -ID following: keep on ancef pending CVTS sx (end date 09/24/17) -Dentistry consulted,appreciate recommendations: full extraction 11A 3/21.   Osteomyelitis MRI on spine on 08/19/17 showing T7-T8 discitis-osteomyelitis without abscess and C6-7 suspicious for discitis-osteomyelitis without abscess. Mild spinal stenosis of T7-8 also seen.  -ID following, appreciate recommendations: continue abx as described above, no MRI needed   Anemia Chronic, stable. Current Hgb9.9. Appears to be chronically low with baseline of 8-9. Normocytic  -continue to monitor with daily CBC  Left knee edema h/o left MSSA septic arthritis Presented with lower extremity edema. Does have recent  septic arthritis of the knee.DVT r/o with LE dopplers. -continue to monitor   Hepatitis C genotype 2B Noted during previous hospitalization. No prior treatment. Recent viral load 224K.LFTsstable.  -ID consulted, appreciate recommendations: recommended outpatient tx   Compression deformities of mid to lower thoracic spine Patientreports history of fall back in January 2019he continues to have persistent low back pain since discharge. Patient reports use of OxyContin and oxycodone for pain control though no medications per chart review. PT recommending SNF.  -continuepercocet 5-325 q6H scheduled, wean as tolerated -MRI spineunder general anesthesia  Polysubstance abuse UDS positive for barbiturates and opiates on presentation. Also has history of cocaine back in January 2019. Has known endocarditis likely from IV drug use though patient reports no recentillicit drug use. CIWA 2 overnight. COWS 5>8>6>6 overnight. -Continue onCIWA  -Daily folate and thiamine -monitor COWS score -percocet 5 Q6, can plan to wean as tolerated  Hyponatremia Current Naimproved to 128.Likely 2/2 poor oral intake as patient has been NPO on and off for several days and likely had poor oral intake prior to admission. -monitor on daily BMP -encourage POintake  Hypokalemia-resolved K of 3.2 -replete with KDur as needed -monitor on daily BMP  Protein calorie malnutrition  Albumin of 2.3 on 3/16. Likely 2/2 poor oral intake -consult nutrition, appreciate recommendations  C-diff -enteric precautions -PO vancomycin x10 days (3/20-)  Disposition Recommended by therapy for inpatient rehab. Rehab admission coordinator recommending likely SNF due to need for longer term care and PICC line management.   FEN/GI:NPO for dental procedure  ZHY:QMVH,QION discontinuedlovenoxfor dental procedure.  Disposition: continued inpatient stay   Subjective:  Patient just returned from oral  procedure. Unable to talk as packing in mouth but shook head that he felt well. Shook head yes that breathing was ok. Shook head yes that he had pain in his mouth.   Objective: Temp:  [98.8 F (37.1 C)-99.5 F (  37.5 C)] 99 F (37.2 C) (03/21 1007) Pulse Rate:  [91-98] 98 (03/21 1007) Resp:  [10-23] 23 (03/21 1007) BP: (90-104)/(56-68) 104/63 (03/21 1007) SpO2:  [95 %-100 %] 96 % (03/21 1007) Weight:  [124 lb 12.5 oz (56.6 kg)] 124 lb 12.5 oz (56.6 kg) (03/21 0507) Physical Exam: General: awake and alert, NAD, sitting up in bed, mouth filled with packing, ice pack around chin  Cardiovascular: RRR, grade 3 murmur Respiratory: CTAB, no wheezes, rales, or rhonchi  Abdomen: soft, non tender, non distended, bowel sounds present  Extremities: non tender, no edema   Laboratory: Recent Labs  Lab 08/19/17 0952 08/20/17 0319 08/21/17 0407  WBC 14.3* 22.4* 26.2*  HGB 9.5* 9.4* 9.9*  HCT 29.8* 30.3* 30.6*  PLT 265 284 286   Recent Labs  Lab 08/15/17 1054 08/16/17 0357  08/19/17 0340 08/19/17 0952 08/20/17 0319 08/21/17 0407  NA 136 132*   < > 128*  --  132* 128*  K 4.2 3.9   < > 3.2*  --  3.7 3.2*  CL 101 95*   < > 88*  --  97* 93*  CO2 26 26   < > 27  --  24 23  BUN 14 15   < > 22*  --  18 16  CREATININE 1.16 1.15   < > 1.23 1.10 1.06 1.02  CALCIUM 8.5* 8.4*   < > 8.4*  --  8.1* 8.2*  PROT 6.7 6.8  --   --   --   --   --   BILITOT 0.5 0.4  --   --   --   --   --   ALKPHOS 88 101  --   --   --   --   --   ALT <5* <5*  --   --   --   --   --   AST 22 20  --   --   --   --   --   GLUCOSE 124* 95   < > 89  --  132* 114*   < > = values in this interval not displayed.    Results for orders placed or performed during the hospital encounter of 08/09/17  Culture, blood (Routine x 2)     Status: None   Collection Time: 08/09/17  2:25 PM  Result Value Ref Range Status   Specimen Description BLOOD LEFT WRIST  Final   Special Requests IN PEDIATRIC BOTTLE Blood Culture adequate  volume  Final   Culture   Final    NO GROWTH 5 DAYS Performed at [facility], [address], [place], [place] [address]    Report Status 08/14/2017 FINAL  Final  Culture, blood (Routine x 2)     Status: None   Collection Time: 08/09/17  2:48 PM  Result Value Ref Range Status   Specimen Description BLOOD RIGHT HAND  Final   Special Requests IN PEDIATRIC BOTTLE Blood Culture adequate volume  Final   Culture   Final    NO GROWTH 5 DAYS Performed at [facility], [address], [place], [place] [address]    Report Status 08/14/2017 FINAL  Final  Urine culture     Status: Abnormal   Collection Time: 08/09/17  3:50 PM  Result Value Ref Range Status   Specimen Description URINE, CLEAN CATCH  Final   Special Requests   Final    NONE Performed at [facility], [address] [place], [place] [address]    Culture MULTIPLE SPECIES PRESENT, SUGGEST RECOLLECTION (A)  Final   Report Status 08/10/2017 FINAL  Final  Respiratory Panel by PCR     Status: None   Collection Time: 08/09/17  4:00 PM  Result Value Ref Range Status   Adenovirus NOT DETECTED NOT DETECTED Final   Coronavirus 229E NOT DETECTED NOT DETECTED Final   Coronavirus HKU1 NOT DETECTED NOT DETECTED Final   Coronavirus NL63 NOT DETECTED NOT DETECTED Final   Coronavirus OC43 NOT DETECTED NOT DETECTED Final   Metapneumovirus NOT DETECTED NOT DETECTED Final   Rhinovirus / Enterovirus NOT DETECTED NOT DETECTED Final   Influenza A NOT DETECTED NOT DETECTED Final   Influenza B NOT DETECTED NOT DETECTED Final   Parainfluenza Virus 1 NOT DETECTED NOT DETECTED Final   Parainfluenza Virus 2 NOT DETECTED NOT DETECTED Final   Parainfluenza Virus 3 NOT DETECTED NOT DETECTED Final   Parainfluenza Virus 4 NOT DETECTED NOT DETECTED Final   Respiratory Syncytial Virus NOT DETECTED NOT DETECTED Final   Bordetella pertussis NOT DETECTED NOT DETECTED Final   Chlamydophila pneumoniae NOT DETECTED NOT DETECTED Final    Mycoplasma pneumoniae NOT DETECTED NOT DETECTED Final    Comment: Performed at [facility], [address] [place], [place] [address]  Culture, blood (routine x 2) Call MD if unable to obtain prior to antibiotics being given     Status: None   Collection Time: 08/10/17  1:10 AM  Result Value Ref Range Status   Specimen Description BLOOD RIGHT HAND  Final   Special Requests IN PEDIATRIC BOTTLE Blood Culture adequate volume  Final   Culture   Final    NO GROWTH 5 DAYS Performed at [facility], [address] [place], [place] [address]    Report Status 08/15/2017 FINAL  Final  Culture, blood (routine x 2) Call MD if unable to obtain prior to antibiotics being given     Status: None   Collection Time: 08/10/17  1:20 AM  Result Value Ref Range Status   Specimen Description BLOOD RIGHT ARM  Final   Special Requests   Final    BOTTLES DRAWN AEROBIC AND ANAEROBIC Blood Culture adequate volume   Culture   Final    NO GROWTH 5 DAYS Performed at [facility], [address], [place], [place] [address]    Report Status 08/15/2017 FINAL  Final  Fungus Culture With Stain     Status: None   Collection Time: 08/10/17 12:59 PM  Result Value Ref Range Status   Fungus Stain Final report  Final   Fungus (Mycology) Culture Final report  Final    Comment: (NOTE) Performed At: [facility] [address] [place], [place] [id number] Jolene Schimke MD MV:[id number]    Fungal Source PLEURAL  Final    Comment: RIGHT Performed at [facility], [address] [place], [place] [address]   Acid Fast Smear (AFB)     Status: None   Collection Time: 08/10/17 12:59 PM  Result Value Ref Range Status   AFB Specimen Processing Concentration  Final   Acid Fast Smear Negative  Final    Comment: (NOTE) Performed At: [facility] [address] [place], [place] [id number] Jolene Schimke MD [id number]    Source (AFB) PLEURAL  Final    Comment: RIGHT Performed at  [facility], [address] [place], [place] [address]   Culture, body fluid-bottle  Status: None   Collection Time: 08/10/17 12:59 PM  Result Value Ref Range Status   Specimen Description PLEURAL RIGHT  Final   Special Requests NONE  Final   Culture   Final    NO GROWTH 5 DAYS Performed at [facility], [address], [place], [place] [address]    Report Status 08/15/2017 FINAL  Final  Gram stain     Status: None   Collection Time: 08/10/17 12:59 PM  Result Value Ref Range Status   Specimen Description PLEURAL RIGHT  Final   Special Requests NONE  Final   Gram Stain   Final    FEW WBC PRESENT,BOTH PMN AND MONONUCLEAR NO ORGANISMS SEEN Performed at [facility], [address], [place], [place] [address]    Report Status 08/10/2017 FINAL  Final  Fungus Culture Result     Status: None   Collection Time: 08/10/17 12:59 PM  Result Value Ref Range Status   Result 1 Comment  Final    Comment: (NOTE) KOH/Calcofluor preparation:  no fungus observed. Performed At: [facility] [address] [place], [place] [id number] Jolene Schimke MD WG:[id number] Performed at [facility], [address] [place], [place] [address]   Fungal organism reflex     Status: None   Collection Time: 08/10/17 12:59 PM  Result Value Ref Range Status   Fungal result 1 Comment  Final    Comment: (NOTE) No yeast or mold isolated after 1 week. Performed At: [facility] [address] [place], [place] [id number] Jolene Schimke MD KG:[id number] Performed at [facility], [address], [place], [place] [address]   MRSA PCR Screening     Status: Abnormal   Collection Time: 08/11/17  6:18 AM  Result Value Ref Range Status   MRSA by PCR POSITIVE (A) NEGATIVE Final    Comment:        The GeneXpert MRSA Assay (FDA approved for NASAL specimens only), is one component of a comprehensive MRSA colonization surveillance program. It is not intended to  diagnose MRSA infection nor to guide or monitor treatment for MRSA infections. RESULT CALLED TO, READ BACK BY AND VERIFIED WITH: RN Osa Craver [id number] [phone] MLM Performed at [facility], [address] [place], [place] [address]   Culture, blood (routine x 2)     Status: None   Collection Time: 08/15/17 10:54 AM  Result Value Ref Range Status   Specimen Description BLOOD RIGHT HAND  Final   Special Requests   Final    BOTTLES DRAWN AEROBIC AND ANAEROBIC Blood Culture results may not be optimal due to an inadequate volume of blood received in culture bottles   Culture   Final    NO GROWTH 5 DAYS Performed at [facility], [address], [place], [place] [address]    Report Status 08/20/2017 FINAL  Final  Culture, blood (routine x 2)     Status: None   Collection Time: 08/15/17 10:54 AM  Result Value Ref Range Status   Specimen Description BLOOD LEFT HAND  Final   Special Requests   Final    BOTTLES DRAWN AEROBIC AND ANAEROBIC Blood Culture adequate volume   Culture   Final    NO GROWTH 5 DAYS Performed at [facility], [address], [place], [place] [address]    Report Status 08/20/2017 FINAL  Final  C difficile quick scan w PCR reflex     Status:  Abnormal   Collection Time: 08/20/17  5:19 PM  Result Value Ref Range Status   C Diff antigen POSITIVE (A) NEGATIVE Final    Comment: CRITICAL RESULT CALLED TO, READ BACK BY AND VERIFIED WITH: M.NEWELL,RN AT 2112 BY L.PITT 08/20/17    C Diff toxin POSITIVE (A) NEGATIVE Final    Comment: CRITICAL RESULT CALLED TO, READ BACK BY AND VERIFIED WITH: M.NEWELL,RN AT 2112 BY L.PITT 08/20/17    C Diff interpretation Toxin producing [name] detected.  Final    Imaging/Diagnostic Tests: Dg Orthopantogram  Result Date: 08/15/2017 CLINICAL DATA:  Poor dentition.  Cardiac workup. EXAM: ORTHOPANTOGRAM/PANORAMIC COMPARISON:  None. FINDINGS: There are numerous missing maxillary and mandibular teeth, and multiple remaining  maxillary teeth appear broken. There is mild periapical lucency associated with a left maxillary premolar tooth. IMPRESSION: Poor dentition with multiple missing and broken teeth. Mild periapical lucency associated with a left maxillary premolar tooth, abscess not excluded. Electronically Signed   By: Sebastian Ache M.D.   On: 08/15/2017 09:12   Dg Chest 2 View  Result Date: 08/14/2017 CLINICAL DATA:  Followup pneumonia. EXAM: CHEST - 2 VIEW COMPARISON:  08/10/2016 and older studies. FINDINGS: Moderate right and small left pleural effusions. There is associated lung base opacity, greater on the right, which is consistent with atelectasis, pneumonia or a combination. The patchy airspace opacity noted along the perihilar left lung on the prior study has improved. Airspace opacity in the right mid to lower lung appears somewhat less confluent but is unchanged in extent. No new lung abnormalities.  No pneumothorax. IMPRESSION: 1. Mild improvement in lung consolidation compared to the prior exam. 2. Persistent moderate right and small left pleural effusions. No new abnormalities. Electronically Signed   By: Amie Portland M.D.   On: 08/14/2017 13:16   Dg Chest 2 View  Addendum Date: 08/09/2017   ADDENDUM REPORT: 08/09/2017 15:41 ADDENDUM: After further review, there are compression deformities of 2 adjacent vertebral bodies in the mid to lower thoracic spine, of uncertain age but new compared to an older chest x-ray 02/15/2005. Would consider further characterization with CT. These results were called by telephone at the time of interpretation on 08/09/2017 at 3:40 pm to Dr. Doug Sou , who verbally acknowledged these results. Electronically Signed   By: Bary Richard M.D.   On: 08/09/2017 15:41   Result Date: 08/09/2017 CLINICAL DATA:  Shortness of breath for 2 days. Bacterial meningitis diagnosed at the beginning of January, than recently diagnosed with pneumonia. History of TIA in January with left-sided  weakness. Former smoker. EXAM: CHEST - 2 VIEW COMPARISON:  Chest x-ray dated 06/08/2017. FINDINGS: New dense opacity at the right lung base, likely a combination of consolidation and pleural effusion. Pleural effusion component is at least moderate in size. Patchy opacities are seen within the left perihilar and lower lung zones, favor edema. Additional opacity at the left lung base is likely a combination of atelectasis and small pleural effusion. Heart size and mediastinal contours are stable. Osseous structures about the chest are unremarkable. IMPRESSION: 1. New dense opacity at the right lung base, likely a combination of pneumonia or atelectasis and pleural effusion. The pleural effusion component is at least moderate in size. Would consider chest CT for further characterization. 2. Additional patchy ill-defined opacities within the left mid and lower lung, most likely edema, pneumonia considered less likely. 3. Probable mild atelectasis and/or small pleural effusion at the left lung base. Electronically Signed: By: Bary Richard M.D. On: 08/09/2017  15:29   Ct Angio Chest Pe W And/or Wo Contrast  Result Date: 08/09/2017 CLINICAL DATA:  SOB x2 days. Pt diagnosed with bacterial meningitis at the beginning of January, then recently diagnosed with pneumonia. Pt also has hx of TIA beginning of January that left him with left sided weakness. Patient had left knee surgery x 6 weeks ago for sepsis. EXAM: CT ANGIOGRAPHY CHEST WITH CONTRAST TECHNIQUE: Multidetector CT imaging of the chest was performed using the standard protocol during bolus administration of intravenous contrast. Multiplanar CT image reconstructions and MIPs were obtained to evaluate the vascular anatomy. CONTRAST:  ISOVUE-370 IOPAMIDOL (ISOVUE-370) INJECTION 76% COMPARISON:  Current chest radiographs and prior studies. FINDINGS: Cardiovascular: Satisfactory opacification of the pulmonary arteries to the segmental level. No evidence of  pulmonary embolism. Heart is normal in size and configuration. No pericardial effusion. No coronary artery calcifications. The great vessels normal in caliber. No aortic atherosclerosis. No dissection. Mediastinum/Nodes: No neck base or axillary masses or pathologically enlarged lymph nodes. No mediastinal or hilar masses or discrete enlarged lymph nodes. The trachea is patent and normal in caliber. Esophagus is unremarkable. Lungs/Pleura: Large right and small left pleural effusions. Complete atelectasis of the right middle lobe and near complete atelectasis of the right lower lobe. Small area of consolidation in the anterior inferior right upper lobe. Patchy consolidation is noted in a peribronchovascular distribution in the left upper lobe and, to lesser degree, left lower lobe. There is dependent opacity also in the left lower lobe consistent with atelectasis. No pneumothorax. Upper Abdomen: No acute abnormality. Musculoskeletal: There is marked narrowing of the T6-T7 disc space with resorption/irregularity of the lower endplate of T6 in upper endplate of T7, with mild loss of height of these vertebra. This leads to a mild focal kyphosis. This is new since a chest radiograph dated 02/15/2005. A subtle lucency across the T6 spinous process posteriorly suggests a previous fracture. All remaining vertebral bodies are normal in height. No osteoblastic or osteolytic lesions. Review of the MIP images confirms the above findings. IMPRESSION: 1. Patchy areas of consolidation in both lungs consistent with multifocal pneumonia. 2. Complete atelectasis of the right middle lobe and near complete atelectasis of the right lower lobe. Mild dependent atelectasis in the left lower lobe. 3. Large right and small left pleural effusions. 4. Marked loss of the disc space at T6-C7 with irregular resorption of endplates leading to loss of vertebral body height of T6 and T7 and a focal kyphosis. This may reflect the sequelae of an old  fracture. It could be due to previous discitis/osteomyelitis. Electronically Signed   By: Amie Portland M.D.   On: 08/09/2017 18:44   Mr Brain Wo Contrast  Result Date: 08/14/2017 CLINICAL DATA:  52 y/o  M; stroke follow-up. EXAM: MRI HEAD WITHOUT CONTRAST TECHNIQUE: Axial DWI sequence was acquired. Patient combative, unable to continue examination. COMPARISON:  06/09/2017 MRI head.  06/08/2017 CT head. FINDINGS: Low B value DWI sequence demonstrates areas of T2 hyperintensity in the right greater than left frontal, parietal, and occipital lobes as well as splenium of corpus callosum and cerebellum corresponding to infarctions on prior MRI. The high B value sequence is motion degraded, insufficient to assess for reduced diffusion and acute stroke. IMPRESSION: Severe motion degraded DWI sequence insufficient to assess for acute stroke. Repeat imaging is recommended when patient is able to hold still and follow commands. Electronically Signed   By: Mitzi Hansen M.D.   On: 08/14/2017 21:38   Mr Laqueta Jean  Wo Contrast  Result Date: 08/19/2017 CLINICAL DATA:  Hospitalized in January with bacteremia, mitral valve endocarditis, CNS septic emboli, and thoracic spine discitis. Persistent back pain. EXAM: MRI HEAD WITHOUT AND WITH CONTRAST MRI THORACIC SPINE WITHOUT AND WITH CONTRAST MRI LUMBAR SPINE WITHOUT AND WITH CONTRAST TECHNIQUE: Multiplanar, multiecho pulse sequences of the brain and surrounding structures, and cervical spine, to include the craniocervical junction and cervicothoracic junction, were obtained without and with intravenous contrast. Multiplanar and multiecho pulse sequences of the thoracic and lumbar spine were obtained without and with intravenous contrast. CONTRAST:  9mL MULTIHANCE GADOBENATE DIMEGLUMINE 529 MG/ML IV SOLN COMPARISON:  Brain MRI 06/09/2017. CTA chest, abdomen, and pelvis 08/14/2017. Lumbar spine CT 12/30/2015. FINDINGS: MRI HEAD FINDINGS Brain: Embolic infarcts  throughout the brain on the prior MRI demonstrate interval evolution. Associated diffusion abnormality has decreased though not completely resolved in some of the areas of infarction, and there is developing encephalomalacia. Associated chronic blood products are present with the majority of the infarcts. Multiple infarcts demonstrate enhancement. No definite restricted diffusion is seen to clearly indicate acute infarction. There are a few subcentimeter infarcts in the left centrum semiovale which are new from the prior MRI and demonstrate mild trace diffusion abnormality without reduced ADC, consistent with interval subacute infarcts. No intracranial mass, midline shift, or extra-axial fluid collection is seen. There is mild cerebral atrophy. No abnormal meningeal enhancement is identified. Vascular: Major intracranial vascular flow voids are preserved. Skull and upper cervical spine: Unremarkable bone marrow signal. Sinuses/Orbits: Unremarkable orbits. Clear paranasal sinuses. Trace left mastoid fluid. Other: None. MRI THORACIC SPINE FINDINGS Spinal numbering is performed by counting down from the craniocervical junction. This results in different numbering than what was reported on the prior CTA due to transitional lumbosacral anatomy. Some sequences are up to moderately motion degraded. Alignment: Slight focal kyphosis at T7-8.  No listhesis. Vertebrae: Disc and endplate destruction at T7-8 as seen on prior CT with diffuse enhancement throughout the residual disc space and marrow of both T7 and T8 vertebral bodies extending into the pedicles. Minimal dorsal epidural enhancement at T7-8 without evidence of epidural abscess. Partially visualized marrow edema and enhancement in the C6 and C7 vertebral bodies with endplate irregularity, disc space narrowing, and enhancement anteriorly in the disc space. No evidence of C6-7 epidural abscess on limited sagittal imaging. Very mild endplate edema and enhancement at  T11-12 favored to be degenerative given lack of disc abnormality or endplate erosion to suggest infection. Background diminished T1 bone marrow signal intensity diffusely is likely related to patient's known anemia. 1.4 cm enhancing lesion in the posterior elements on the right at T3 is consistent with an hemangioma. A smaller atypical hemangioma is also suspected in the right T2 pedicle. Cord:  Normal signal. Paraspinal and other soft tissues: Paravertebral soft tissue edema/phlegmon from T6-T8. Interspinous soft tissue edema and enhancement at T7-8. No paraspinal abscess identified. Partially visualized prevertebral inflammation in the lower cervical spine. Moderate right and small left pleural effusions with associated compressive atelectasis on the right. Small volume fluid in the esophagus. Partially visualized endotracheal tube and tracheal secretions. Disc levels: Moderate disc space narrowing, disc bulging, and endplate spurring at C7-T1 result in moderate right and mild-to-moderate left neural foraminal stenosis and at most mild spinal stenosis. Broad-based posterior disc osteophyte complex and facet hypertrophy at T7-8 result in mild spinal stenosis without cord compression. Mild disc bulging and scattered tiny disc protrusions elsewhere in the thoracic spine do not result in spinal stenosis. MRI LUMBAR SPINE  FINDINGS Some sequences are mildly motion degraded. Segmentation: Transitional lumbosacral anatomy with a lumbarized S1. Alignment: Facet mediated anterolisthesis of L5 on S1 measuring 4 mm. Vertebrae: Diffusely diminished T1 bone marrow signal intensity likely related to anemia. Predominantly type 2 degenerative endplate changes at S1-2. Mild type 1 changes at L3-4. No evidence of infectious discitis-osteomyelitis. No fracture. Conus medullaris: Extends to the L2 level and appears normal. Paraspinal and other soft tissues: Mild nonspecific posterior paraspinal muscle edema bilaterally in the mid and  lower lumbar spine. No fluid collection. Disc levels: L1-2: Negative. L2-3: Minimal disc bulging without stenosis. L3-4: Mild disc space narrowing. Mild disc bulging and mild facet and ligamentum flavum hypertrophy result in mild bilateral lateral recess stenosis and mild left neural foraminal stenosis without significant spinal stenosis. L4-5: Mild disc space narrowing. Mild disc bulging asymmetric to the right and moderate right and mild left facet hypertrophy result in mild right lateral recess stenosis and mild right neural foraminal stenosis without significant spinal stenosis. L5-S1: Anterolisthesis with disc uncovering and severe facet hypertrophy result in mild spinal stenosis, mild-to-moderate bilateral lateral recess stenosis, and mild-to-moderate bilateral neural foraminal stenosis. S1-2: Transitional level with disc space narrowing. Mild disc bulging and moderate facet arthrosis result in mild left neural foraminal stenosis without spinal stenosis. IMPRESSION: 1. 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 2. Interval tiny subacute infarcts in the left centrum semiovale. No acute infarct. 3. Transitional lumbosacral anatomy as above. 4. 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 No epidural or paraspinal abscess. 5. Partially visualized disc and endplate enhancement at C6-7 suspicious for discitis-osteomyelitis with prevertebral inflammation. No evidence of epidural abscess though this was incompletely imaged. 6. Mild spinal stenosis at T7-8. 7. No evidence of discitis-osteomyelitis in the lumbar spine. 8. Nonspecific bilateral lumbar posterior paraspinal muscle edema without abscess. 9. Lumbar disc and facet degeneration most notable at L5-S1 where there is grade 1 anterolisthesis, mild spinal stenosis, and mild-to-moderate lateral recess and neural foraminal stenosis. 10. Moderate right and small left pleural effusions. Electronically Signed    By: Sebastian Ache M.D.   On: 08/19/2017 15:55   Mr Thoracic Spine W Wo Contrast  Result Date: 08/19/2017 CLINICAL DATA:  Hospitalized in January with bacteremia, mitral valve endocarditis, CNS septic emboli, and thoracic spine discitis. Persistent back pain. EXAM: MRI HEAD WITHOUT AND WITH CONTRAST MRI THORACIC SPINE WITHOUT AND WITH CONTRAST MRI LUMBAR SPINE WITHOUT AND WITH CONTRAST TECHNIQUE: Multiplanar, multiecho pulse sequences of the brain and surrounding structures, and cervical spine, to include the craniocervical junction and cervicothoracic junction, were obtained without and with intravenous contrast. Multiplanar and multiecho pulse sequences of the thoracic and lumbar spine were obtained without and with intravenous contrast. CONTRAST:  9mL MULTIHANCE GADOBENATE DIMEGLUMINE 529 MG/ML IV SOLN COMPARISON:  Brain MRI 06/09/2017. CTA chest, abdomen, and pelvis 08/14/2017. Lumbar spine CT 12/30/2015. FINDINGS: MRI HEAD FINDINGS Brain: Embolic infarcts throughout the brain on the prior MRI demonstrate interval evolution. Associated diffusion abnormality has decreased though not completely resolved in some of the areas of infarction, and there is developing encephalomalacia. Associated chronic blood products are present with the majority of the infarcts. Multiple infarcts demonstrate enhancement. No definite restricted diffusion is seen to clearly indicate acute infarction. There are a few subcentimeter infarcts in the left centrum semiovale which are new from the prior MRI and demonstrate mild trace diffusion abnormality without reduced ADC, consistent with interval subacute infarcts. No intracranial mass, midline shift, or extra-axial fluid collection is seen. There is mild  cerebral atrophy. No abnormal meningeal enhancement is identified. Vascular: Major intracranial vascular flow voids are preserved. Skull and upper cervical spine: Unremarkable bone marrow signal. Sinuses/Orbits: Unremarkable orbits.  Clear paranasal sinuses. Trace left mastoid fluid. Other: None. MRI THORACIC SPINE FINDINGS Spinal numbering is performed by counting down from the craniocervical junction. This results in different numbering than what was reported on the prior CTA due to transitional lumbosacral anatomy. Some sequences are up to moderately motion degraded. Alignment: Slight focal kyphosis at T7-8.  No listhesis. Vertebrae: Disc and endplate destruction at T7-8 as seen on prior CT with diffuse enhancement throughout the residual disc space and marrow of both T7 and T8 vertebral bodies extending into the pedicles. Minimal dorsal epidural enhancement at T7-8 without evidence of epidural abscess. Partially visualized marrow edema and enhancement in the C6 and C7 vertebral bodies with endplate irregularity, disc space narrowing, and enhancement anteriorly in the disc space. No evidence of C6-7 epidural abscess on limited sagittal imaging. Very mild endplate edema and enhancement at T11-12 favored to be degenerative given lack of disc abnormality or endplate erosion to suggest infection. Background diminished T1 bone marrow signal intensity diffusely is likely related to patient's known anemia. 1.4 cm enhancing lesion in the posterior elements on the right at T3 is consistent with an hemangioma. A smaller atypical hemangioma is also suspected in the right T2 pedicle. Cord:  Normal signal. Paraspinal and other soft tissues: Paravertebral soft tissue edema/phlegmon from T6-T8. Interspinous soft tissue edema and enhancement at T7-8. No paraspinal abscess identified. Partially visualized prevertebral inflammation in the lower cervical spine. Moderate right and small left pleural effusions with associated compressive atelectasis on the right. Small volume fluid in the esophagus. Partially visualized endotracheal tube and tracheal secretions. Disc levels: Moderate disc space narrowing, disc bulging, and endplate spurring at C7-T1 result in  moderate right and mild-to-moderate left neural foraminal stenosis and at most mild spinal stenosis. Broad-based posterior disc osteophyte complex and facet hypertrophy at T7-8 result in mild spinal stenosis without cord compression. Mild disc bulging and scattered tiny disc protrusions elsewhere in the thoracic spine do not result in spinal stenosis. MRI LUMBAR SPINE FINDINGS Some sequences are mildly motion degraded. Segmentation: Transitional lumbosacral anatomy with a lumbarized S1. Alignment: Facet mediated anterolisthesis of L5 on S1 measuring 4 mm. Vertebrae: Diffusely diminished T1 bone marrow signal intensity likely related to anemia. Predominantly type 2 degenerative endplate changes at S1-2. Mild type 1 changes at L3-4. No evidence of infectious discitis-osteomyelitis. No fracture. Conus medullaris: Extends to the L2 level and appears normal. Paraspinal and other soft tissues: Mild nonspecific posterior paraspinal muscle edema bilaterally in the mid and lower lumbar spine. No fluid collection. Disc levels: L1-2: Negative. L2-3: Minimal disc bulging without stenosis. L3-4: Mild disc space narrowing. Mild disc bulging and mild facet and ligamentum flavum hypertrophy result in mild bilateral lateral recess stenosis and mild left neural foraminal stenosis without significant spinal stenosis. L4-5: Mild disc space narrowing. Mild disc bulging asymmetric to the right and moderate right and mild left facet hypertrophy result in mild right lateral recess stenosis and mild right neural foraminal stenosis without significant spinal stenosis. L5-S1: Anterolisthesis with disc uncovering and severe facet hypertrophy result in mild spinal stenosis, mild-to-moderate bilateral lateral recess stenosis, and mild-to-moderate bilateral neural foraminal stenosis. S1-2: Transitional level with disc space narrowing. Mild disc bulging and moderate facet arthrosis result in mild left neural foraminal stenosis without spinal  stenosis. IMPRESSION: 1. 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  2. Interval tiny subacute infarcts in the left centrum semiovale. No acute infarct. 3. Transitional lumbosacral anatomy as above. 4. 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 No epidural or paraspinal abscess. 5. Partially visualized disc and endplate enhancement at C6-7 suspicious for discitis-osteomyelitis with prevertebral inflammation. No evidence of epidural abscess though this was incompletely imaged. 6. Mild spinal stenosis at T7-8. 7. No evidence of discitis-osteomyelitis in the lumbar spine. 8. Nonspecific bilateral lumbar posterior paraspinal muscle edema without abscess. 9. Lumbar disc and facet degeneration most notable at L5-S1 where there is grade 1 anterolisthesis, mild spinal stenosis, and mild-to-moderate lateral recess and neural foraminal stenosis. 10. Moderate right and small left pleural effusions. Electronically Signed   By: Sebastian Ache M.D.   On: 08/19/2017 15:55   Mr Lumbar Spine W Wo Contrast  Result Date: 08/19/2017 CLINICAL DATA:  Hospitalized in January with bacteremia, mitral valve endocarditis, CNS septic emboli, and thoracic spine discitis. Persistent back pain. EXAM: MRI HEAD WITHOUT AND WITH CONTRAST MRI THORACIC SPINE WITHOUT AND WITH CONTRAST MRI LUMBAR SPINE WITHOUT AND WITH CONTRAST TECHNIQUE: Multiplanar, multiecho pulse sequences of the brain and surrounding structures, and cervical spine, to include the craniocervical junction and cervicothoracic junction, were obtained without and with intravenous contrast. Multiplanar and multiecho pulse sequences of the thoracic and lumbar spine were obtained without and with intravenous contrast. CONTRAST:  9mL MULTIHANCE GADOBENATE DIMEGLUMINE 529 MG/ML IV SOLN COMPARISON:  Brain MRI 06/09/2017. CTA chest, abdomen, and pelvis 08/14/2017. Lumbar spine CT 12/30/2015. FINDINGS: MRI HEAD FINDINGS Brain: Embolic  infarcts throughout the brain on the prior MRI demonstrate interval evolution. Associated diffusion abnormality has decreased though not completely resolved in some of the areas of infarction, and there is developing encephalomalacia. Associated chronic blood products are present with the majority of the infarcts. Multiple infarcts demonstrate enhancement. No definite restricted diffusion is seen to clearly indicate acute infarction. There are a few subcentimeter infarcts in the left centrum semiovale which are new from the prior MRI and demonstrate mild trace diffusion abnormality without reduced ADC, consistent with interval subacute infarcts. No intracranial mass, midline shift, or extra-axial fluid collection is seen. There is mild cerebral atrophy. No abnormal meningeal enhancement is identified. Vascular: Major intracranial vascular flow voids are preserved. Skull and upper cervical spine: Unremarkable bone marrow signal. Sinuses/Orbits: Unremarkable orbits. Clear paranasal sinuses. Trace left mastoid fluid. Other: None. MRI THORACIC SPINE FINDINGS Spinal numbering is performed by counting down from the craniocervical junction. This results in different numbering than what was reported on the prior CTA due to transitional lumbosacral anatomy. Some sequences are up to moderately motion degraded. Alignment: Slight focal kyphosis at T7-8.  No listhesis. Vertebrae: Disc and endplate destruction at T7-8 as seen on prior CT with diffuse enhancement throughout the residual disc space and marrow of both T7 and T8 vertebral bodies extending into the pedicles. Minimal dorsal epidural enhancement at T7-8 without evidence of epidural abscess. Partially visualized marrow edema and enhancement in the C6 and C7 vertebral bodies with endplate irregularity, disc space narrowing, and enhancement anteriorly in the disc space. No evidence of C6-7 epidural abscess on limited sagittal imaging. Very mild endplate edema and  enhancement at T11-12 favored to be degenerative given lack of disc abnormality or endplate erosion to suggest infection. Background diminished T1 bone marrow signal intensity diffusely is likely related to patient's known anemia. 1.4 cm enhancing lesion in the posterior elements on the right at T3 is consistent with an hemangioma. A smaller atypical hemangioma is also suspected  in the right T2 pedicle. Cord:  Normal signal. Paraspinal and other soft tissues: Paravertebral soft tissue edema/phlegmon from T6-T8. Interspinous soft tissue edema and enhancement at T7-8. No paraspinal abscess identified. Partially visualized prevertebral inflammation in the lower cervical spine. Moderate right and small left pleural effusions with associated compressive atelectasis on the right. Small volume fluid in the esophagus. Partially visualized endotracheal tube and tracheal secretions. Disc levels: Moderate disc space narrowing, disc bulging, and endplate spurring at C7-T1 result in moderate right and mild-to-moderate left neural foraminal stenosis and at most mild spinal stenosis. Broad-based posterior disc osteophyte complex and facet hypertrophy at T7-8 result in mild spinal stenosis without cord compression. Mild disc bulging and scattered tiny disc protrusions elsewhere in the thoracic spine do not result in spinal stenosis. MRI LUMBAR SPINE FINDINGS Some sequences are mildly motion degraded. Segmentation: Transitional lumbosacral anatomy with a lumbarized S1. Alignment: Facet mediated anterolisthesis of L5 on S1 measuring 4 mm. Vertebrae: Diffusely diminished T1 bone marrow signal intensity likely related to anemia. Predominantly type 2 degenerative endplate changes at S1-2. Mild type 1 changes at L3-4. No evidence of infectious discitis-osteomyelitis. No fracture. Conus medullaris: Extends to the L2 level and appears normal. Paraspinal and other soft tissues: Mild nonspecific posterior paraspinal muscle edema bilaterally  in the mid and lower lumbar spine. No fluid collection. Disc levels: L1-2: Negative. L2-3: Minimal disc bulging without stenosis. L3-4: Mild disc space narrowing. Mild disc bulging and mild facet and ligamentum flavum hypertrophy result in mild bilateral lateral recess stenosis and mild left neural foraminal stenosis without significant spinal stenosis. L4-5: Mild disc space narrowing. Mild disc bulging asymmetric to the right and moderate right and mild left facet hypertrophy result in mild right lateral recess stenosis and mild right neural foraminal stenosis without significant spinal stenosis. L5-S1: Anterolisthesis with disc uncovering and severe facet hypertrophy result in mild spinal stenosis, mild-to-moderate bilateral lateral recess stenosis, and mild-to-moderate bilateral neural foraminal stenosis. S1-2: Transitional level with disc space narrowing. Mild disc bulging and moderate facet arthrosis result in mild left neural foraminal stenosis without spinal stenosis. IMPRESSION: 1. 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 2. Interval tiny subacute infarcts in the left centrum semiovale. No acute infarct. 3. Transitional lumbosacral anatomy as above. 4. 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 No epidural or paraspinal abscess. 5. Partially visualized disc and endplate enhancement at C6-7 suspicious for discitis-osteomyelitis with prevertebral inflammation. No evidence of epidural abscess though this was incompletely imaged. 6. Mild spinal stenosis at T7-8. 7. No evidence of discitis-osteomyelitis in the lumbar spine. 8. Nonspecific bilateral lumbar posterior paraspinal muscle edema without abscess. 9. Lumbar disc and facet degeneration most notable at L5-S1 where there is grade 1 anterolisthesis, mild spinal stenosis, and mild-to-moderate lateral recess and neural foraminal stenosis. 10. Moderate right and small left pleural effusions.  Electronically Signed   By: Sebastian Ache M.D.   On: 08/19/2017 15:55   Dg Chest Port 1 View  Result Date: 08/10/2017 CLINICAL DATA:  Post right thora, 1.4 L removed EXAM: PORTABLE CHEST 1 VIEW COMPARISON:  Chest x-ray dated 08/09/2017. FINDINGS: Some improvement in aeration at the left lung base. Residual opacity is presumably atelectasis or pneumonia. Ill-defined opacities persist throughout the left lung, consistent with the multifocal pneumonia better demonstrated on chest CT of 08/09/2017. Additional persistent atelectasis at the left lung base. No pneumothorax. Heart size and mediastinal contours appear stable. IMPRESSION: 1. Improved aeration at the right lung base status post thoracentesis. Residual opacity is presumably  atelectasis or pneumonia. No pneumothorax seen. 2. Stable patchy opacities throughout the left lung, compatible with the multifocal pneumonia demonstrated on yesterday's chest CT. Electronically Signed   By: Bary RichardStan  Maynard M.D.   On: 08/10/2017 14:54   Ct Angio Chest/abd/pel For Dissection W And/or W/wo  Result Date: 08/14/2017 CLINICAL DATA:  Septic arterial embolism EXAM: CT ANGIOGRAPHY CHEST, ABDOMEN AND PELVIS TECHNIQUE: Multidetector CT imaging through the chest, abdomen and pelvis was performed using the standard protocol during bolus administration of intravenous contrast. Multiplanar reconstructed images and MIPs were obtained and reviewed to evaluate the vascular anatomy. CONTRAST:  100 cc ISOVUE-370 IOPAMIDOL (ISOVUE-370) INJECTION 76% COMPARISON:  Chest CT 08/09/2017 FINDINGS: CTA CHEST FINDINGS Cardiovascular: No acute pulmonary embolus. Satisfactory opacification of the aorta and pulmonary arteries. Normal size heart without pericardial effusion. Normal branch pattern of the great vessels off the arch. No coronary arteriosclerosis. No aortic atherosclerosis. No aneurysm. No aortic dissection. Mediastinum/Nodes: No supraclavicular, axillary, mediastinal nor hilar  lymphadenopathy. Trachea is patent and normal caliber without intraluminal abnormality. Esophagus is unremarkable. No thyromegaly is noted. Lungs/Pleura: Bilateral moderate-sized pleural effusions slightly smaller on the right with loculated component. Adjacent compressive atelectasis is noted. Patchy airspace opacities in the left upper lobe are redemonstrated, with slight improvement in aeration since prior though the vast majority of the pulmonary opacity still persists. Musculoskeletal: Redemonstration of marked disc space narrowing C6-7 with endplate irregularity of the inferior endplate of T6 superior endplate of T7 with resultant mild focal kyphosis. Review of the MIP images confirms the above findings. CTA ABDOMEN AND PELVIS FINDINGS VASCULAR Aorta: Normal caliber aorta without aneurysm, dissection, vasculitis or significant stenosis. Celiac: Patent without evidence of aneurysm, dissection, vasculitis or significant stenosis. SMA: Patent without evidence of aneurysm, dissection, vasculitis or significant stenosis. Renals: Single renal arteries bilaterally without acute abnormality. No significant stenosis, dissection or evidence of fibromuscular dysplasia. IMA: Patent Inflow: No aneurysm or dissection. Minimal atherosclerotic plaque in the proximal right common iliac and both distal iliac arteries. No significant stenosis. No dissection or aneurysm. Veins: No obvious venous abnormality within the limitations of this arterial phase study. Review of the MIP images confirms the above findings. NON-VASCULAR Hepatobiliary: No focal liver abnormality is seen. No gallstones, gallbladder wall thickening, or biliary dilatation. Pancreas: Unremarkable. No pancreatic ductal dilatation or surrounding inflammatory changes. Spleen: Heterogeneous enhancement likely due to timing of contrast bolus. No splenomegaly. Adrenals/Urinary Tract: No adrenal no renal masses. No nephrolithiasis nor obstructive uropathy. No  hydroureteronephrosis. Unremarkable bladder. Stomach/Bowel: Nondistended stomach with normal small bowel rotation. Mild fluid-filled distention of small bowel loops with slight mural thickening of jejunum may reflect a mild small bowel enteritis. No mechanical source obstruction is seen. Colon is. Normal appearing appendix. Lymphatic: No abdominopelvic adenopathy. Reproductive: Normal size prostate and seminal vesicles. Other: Small amount of fluid in the left inguinal canal. Musculoskeletal: Plate and screw fixation of the left acetabulum. 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 No acute osseous abnormality. There is lower lumbar degenerative facet arthropathy. Minimal anterolisthesis of L4 on L5. Marked disc space narrowing at L5-S1. Review of the MIP images confirms the above findings. IMPRESSION: Chest CT: 1. No acute pulmonary embolus. 2. No aortic aneurysm or dissection. 3. Bilateral moderate-sized pleural effusions slightly smaller on the right with loculated component redemonstrated. Adjacent compressive atelectasis is seen both lower lobes. 4. Relatively unchanged patchy airspace opacity in the left upper lobe only minimally improved in aeration. 5. Redemonstration of focal kyphosis at T6-7 secondary to endplate irregularities and disc space narrowing  suspicious for changes of discitis/osteomyelitis. MRI would be the study of choice for further correlation as to acuity. Abdomen and pelvic CT: 1. No aortic aneurysm or dissection. Patent branch vessels without significant stenosis or focal abnormality. 2. Mild fluid-filled distention of small bowel which transmural thickening suspicious for changes of mild enteritis. No mechanical bowel obstruction. Electronically Signed   By: Tollie Eth M.D.   On: 08/14/2017 21:21   US Thoracentesis Asp Pleural Space W/img Guide  Result Date: 08/10/2017 INDICATION: Respiratory failure. Large right pleural effusion. Request for diagnostic and therapeutic  thoracentesis. EXAM: ULTRASOUND GUIDED RIGHT THORACENTESIS MEDICATIONS: None. COMPLICATIONS: None immediate. PROCEDURE: An ultrasound guided thoracentesis was thoroughly discussed with the patient and questions answered. The benefits, risks, alternatives and complications were also discussed. The patient understands and wishes to proceed with the procedure. Written consent was obtained. Ultrasound was performed to localize and mark an adequate pocket of fluid in the right chest. The area was then prepped and draped in the normal sterile fashion. 1% Lidocaine was used for local anesthesia. Under ultrasound guidance a Safe-T-Centesis catheter was introduced. Thoracentesis was performed. The catheter was removed and a dressing applied. FINDINGS: A total of approximately 1.4 L of pink/blood-tinged fluid was removed. Samples were sent to the laboratory as requested by the clinical team. IMPRESSION: Successful ultrasound guided right thoracentesis yielding 1.4 L of pleural fluid. Read by: Brayton El PA-C Electronically Signed   By: Corlis Leak M.D.   On: 08/10/2017 12:55     Oralia Manis, DO 08/21/2017, 11:12 AM PGY-1, [facility] Intern pager: [phone], text pages welcome

## 2017-08-21 NOTE — Anesthesia Procedure Notes (Signed)
Procedure Name: Intubation Date/Time: 08/21/2017 7:52 AM Performed by: Freddie Breech, CRNA Pre-anesthesia Checklist: Patient identified, Emergency Drugs available, Suction available and Patient being monitored Patient Re-evaluated:Patient Re-evaluated prior to induction Oxygen Delivery Method: Circle System Utilized Preoxygenation: Pre-oxygenation with 100% oxygen Induction Type: IV induction Ventilation: Mask ventilation without difficulty Laryngoscope Size: Mac and 4 Grade View: Grade I Nasal Tubes: Nasal Rae and Nasal prep performed Tube size: 7.0 mm Number of attempts: 1 Placement Confirmation: ETT inserted through vocal cords under direct vision,  positive ETCO2 and breath sounds checked- equal and bilateral Secured at: 28 cm Tube secured with: Tape Dental Injury: Teeth and Oropharynx as per pre-operative assessment

## 2017-08-21 NOTE — Transfer of Care (Signed)
Immediate Anesthesia Transfer of Care Note  Patient: Marcus [name]  Procedure(s) Performed: EXTRACTION OF TOOTH #'S 1,2,3,5,6,10,11,14,15,20-29 WITH ALVEOLOPLASTY, MAXILLARY LEFT BUCCAL EXOSTOSES REDUCTIONS AND BILATERAL MANDIBULAR TORI REDUCTIONS (N/A )  Patient Location: PACU  Anesthesia Type:General  Level of Consciousness: drowsy and patient cooperative  Airway & Oxygen Therapy: Patient Spontanous Breathing and Patient connected to face mask oxygen  Post-op Assessment: Report given to RN and Post -op Vital signs reviewed and stable  Post vital signs: Reviewed and stable  Last Vitals:  Vitals Value Taken Time  BP 90/56 08/21/2017  9:43 AM  Temp 37.3 C 08/21/2017  9:43 AM  Pulse 99 08/21/2017  9:47 AM  Resp 17 08/21/2017  9:47 AM  SpO2 100 % 08/21/2017  9:47 AM  Vitals shown include unvalidated device data.  Last Pain:  Vitals:   08/21/17 0507  TempSrc: Oral  PainSc:       Patients Stated Pain Goal: 0 (08/20/17 1930)  Complications: No apparent anesthesia complications

## 2017-08-21 NOTE — Progress Notes (Signed)
PT Cancellation Note  Patient Details Name: Kern ReapBryon T Schwake MRN: [id number] DOB: 09/04/1965   Cancelled Treatment:    Reason Eval/Treat Not Completed: Patient at procedure or test/unavailable.  Marylynn PearsonLaura D Demetric Dunnaway 08/21/2017, 7:28 AM  Conni SlipperLaura Remmy Riffe, PT, DPT Acute Rehabilitation Services Pager: [phone]

## 2017-08-21 NOTE — Progress Notes (Signed)
         [facility]    Date of Admission:  08/09/2017    Day 13 MSSA therapy        Day 1 oral vancomycin  [name] now has 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  I agree with oral vancomycin.  He will need to stay on oral vancomycin for his long as he remains on systemic therapy for MSSA and probably at least 1 week beyond.         Cliffton AstersJohn Aleksa Catterton, MD [facility] [phone] pager   [phone] cell 08/21/2017, 4:53 PM

## 2017-08-21 NOTE — Anesthesia Postprocedure Evaluation (Signed)
Anesthesia Post Note  Patient: Marcus [name]  Procedure(s) Performed: EXTRACTION OF TOOTH #'S 1,2,3,5,6,10,11,14,15,20-29 WITH ALVEOLOPLASTY, MAXILLARY LEFT BUCCAL EXOSTOSES REDUCTIONS AND BILATERAL MANDIBULAR TORI REDUCTIONS (N/A )     Patient location during evaluation: PACU Anesthesia Type: General Level of consciousness: awake and alert Pain management: pain level controlled Vital Signs Assessment: post-procedure vital signs reviewed and stable Respiratory status: spontaneous breathing, nonlabored ventilation and respiratory function stable Cardiovascular status: blood pressure returned to baseline and stable Postop Assessment: no apparent nausea or vomiting Anesthetic complications: no    Last Vitals:  Vitals Value Taken Time  BP    Temp    Pulse    Resp 27 08/21/2017 11:11 AM  SpO2    Vitals shown include unvalidated device data.  Last Pain:  Vitals:   08/21/17 1007  TempSrc:   PainSc: 0-No pain                 Cherysh Epperly,W. EDMOND

## 2017-08-21 NOTE — Progress Notes (Signed)
OT Cancellation Note  Patient Details Name: Kern ReapBryon T Stucki MRN: [id number] DOB: 09/29/1965   Cancelled Treatment:    Reason Eval/Treat Not Completed: Patient at procedure or test/ unavailable  Gaye AlkenBailey A Rihanna Marseille M.S., OTR/L Pager: [phone]  08/21/2017, 7:00 AM

## 2017-08-21 NOTE — Op Note (Signed)
OPERATIVE REPORT  Patient:            Marcus [name] Date of Birth:  1966-03-23 MRN:                [id number]   DATE OF PROCEDURE:  08/21/2017  PREOPERATIVE DIAGNOSES: 1.  Endocarditis of the mitral valve 2.  Severe mitral regurgitation 3.  Chronic apical periodontitis 4.  Multiple retained root segments 5.  Dental caries 6.  Chronic periodontitis 7.  Loose teeth  POSTOPERATIVE DIAGNOSES: 1.  Endocarditis of the mitral valve 2.  Severe mitral regurgitation 3.  Chronic apical periodontitis 4.  Multiple retained root segments 5.  Dental caries 6.  Chronic periodontitis 7.  Loose teeth 8.  Maxillary left buccal exostoses 9.  Bilateral mandibular lingual tori  OPERATIONS: 1.  Multiple extraction of tooth numbers 1, 2, 3, 5, 6, 10, 11, 14, 15, and 20-24, and 26-29. 2.  4 Quadrants of alveoloplasty 3.  Maxillary left buccal exostosis in the area of tooth numbers 14 through 16 4.  Bilateral mandibular lingual tori reductions   SURGEON: Charlynne Pander, DDS  ASSISTANT: Pearletha Alfred (dental assistant)  ANESTHESIA: General anesthesia via nasoendotracheal tube.  MEDICATIONS: 1. Ancef IV per previous orders for treatment of infective endocarditis  2. Local anesthesia with a total utilization of 6 carpules each containing 34 mg of lidocaine with 0.017 mg of epinephrine as well as 2 carpules each containing 9 mg of bupivacaine with 0.009 mg of epinephrine.  SPECIMENS: There are 18 teeth that were discarded.  DRAINS: None  CULTURES: None  COMPLICATIONS: None  ESTIMATED BLOOD LOSS: 100 mLs.  INTRAVENOUS FLUIDS: 1200 mLs of Lactated ringers solution.  INDICATIONS: The patient was recently diagnosed with endocarditis of the mitral valve with severe mitral regurgitation.  A medically necessary dental consultation was then requested to evaluate poor dentition.  The patient was examined and treatment planned for extraction of remaining teeth with alveoloplasty and  pre-prosthetic surgery as needed in the operating room with general anesthesia.  This treatment plan was formulated to decrease the risks and complications associated with dental infection from further affecting the patient's systemic health and to prevent complications with anticipated heart valve surgery.  OPERATIVE FINDINGS: Patient was examined in the operating room number #11.  The teeth were identified for extraction. It was determined that tooth numbers 1, 2, 3, 5, 6, 10, 11, 14, 15, 20, 21, 22, 23, 24, 26, 27, 28, 29 would need to be extracted.  It was also noted that the patient had maxillary left buccal exostoses that would need to be reduced during the surgery.  Patient also had bilateral mandibular lingual tori that would need to be reduced during the surgery.  The patient, therefore , was noted be affected by chronic periodontitis, chronic apical periodontitis, dental caries, retained root segments, loose teeth, maxillary left buccal exostoses, and bilateral mandibular lingual tori.   DESCRIPTION OF PROCEDURE: Patient was brought to the main operating room number 11. Patient was then placed in the supine position on the operating table. General anesthesia was then induced per the anesthesia team. The patient was then prepped and draped in the usual manner for a dental medicine procedure. A timeout was performed. The patient was identified and procedures were verified. A throat pack was placed at this time. The oral cavity was then thoroughly examined with the findings noted above. The patient was then ready for dental medicine procedure as follows:  Local anesthesia was then administered sequentially with  a total utilization of 6 carpules each containing 34 mg of lidocaine with 0.017 mg of epinephrine as well as 2 carpules  each containing 9 mg bupivacaine with 0.009 mg of epinephrine.  The Maxillary left and right quadrants were first approached. Anesthesia was then delivered utilizing  infiltration with lidocaine with epinephrine. A #15 blade incision was then made from the maxillary right tuberosity and extended to the maxillary left tuberosity.  A  surgical flap was then carefully reflected. The teeth were then subluxated with a series of straight elevators. Tooth numbers 1,2,3,6,7,10, 11, 14 and 15 were then removed with a 150 forceps without complications. Alveoloplasty was then performed utilizing a ronguers and bone file to help achieve primary closure.  The maxillary left buccal exostoses were then visualized and reduced utilizing a rongeurs and bone file.  Further alveoloplasty was then performed as needed with a ronguers and bone file.  The tissues were then approximated and trimmed appropriately.  The surgical sites were then irrigated with copious amounts of sterile saline x4.  The maxillary left surgical site was then closed from the maxillary left tuberosity and extended the mesial #9 utilizing 3-0 chromic gut suture in a continuous interrupted suture technique x1. The maxillary right surgical site was then closed in the maxillary right tuberosity and extended to the mesial #8 utilizing 3-0 chromic gut suture in a continuous interrupted suture technique x1.  2 individual interrupted sutures were then placed to further close the surgical site with 3-0 chromic gut material.    At this point time, the mandibular quadrants were approached. The patient was given bilateral inferior alveolar nerve blocks and long buccal nerve blocks utilizing the bupivacaine with epinephrine. Further infiltration was then achieved utilizing the lidocaine with epinephrine. A 15 blade incision was then made from the distal of number 18 and extended to the distal of #31.  A surgical flap was then carefully reflected. The lower teeth were then subluxated with a series of straight elevators.  Tooth numbers 20 through 24, and 26 through 29 was then removed with a 151 forceps.  Retained root tip in the area of  #21 was then removed with a root tip pick after further bone removal with the rongeurs. Alveoloplasty was then performed utilizing a rongeurs and bone file to help achieve primary closure.  At this point time the bilateral mandibular lingual tori were visualized and reduced utilizing a surgical handpiece and bur and copious amounts of sterile water.  Further alveoloplasty was then performed as needed.  The tissues were approximated and trimmed appropriately. The surgical sites were then irrigated with copious amounts of sterile saline. The mandibular left surgical site was then closed from the distal of 18 and extended the mesial #24 utilizing 3-0 chromic gut suture in a continuous interrupted suture technique x1.  The mandibular right surgical site was then closed from the distal of #31 and extended the mesial of #25 utilizing 3-0 chromic gut suture in a continuous interrupted suture technique x1.   At this point time, the entire mouth was irrigated with copious amounts of sterile saline. The patient was examined for complications, seeing none, the dental medicine procedure was deemed to be complete. The throat pack was removed at this time. An oral airway was then placed at the request of the anesthesia team. A series of 4 x 4 gauze moistened with Amicar 5% rinse were placed in the mouth to aid hemostasis. The patient was then handed over to the anesthesia team for final  disposition. After an appropriate amount of time, the patient was extubated and taken to the postanesthsia care unit in good condition. All counts were correct for the dental medicine procedure.  The patient is to continue the Amicar 5% rinses postoperatively.  Patient is to rinse with 10 mL's every hour for the next 10 hours in a swish and spit manner.    Charlynne Panderonald F. Kulinski, DDS.

## 2017-08-21 NOTE — Plan of Care (Signed)
  Safety: Ability to remain free from injury will improve 08/21/2017 0052 - Progressing by Murlean HarkHinckley, Jaythen Hamme, RN  Pt educated to use call light to call for assistance when necessary. Pt verbalizes understanding of education. Bed alarm implemented for safety

## 2017-08-22 ENCOUNTER — Encounter (HOSPITAL_COMMUNITY): Payer: Self-pay | Admitting: Dentistry

## 2017-08-22 ENCOUNTER — Other Ambulatory Visit (HOSPITAL_COMMUNITY): Payer: Self-pay

## 2017-08-22 DIAGNOSIS — I340 Nonrheumatic mitral (valve) insufficiency: Secondary | ICD-10-CM

## 2017-08-22 DIAGNOSIS — Z98818 Other dental procedure status: Secondary | ICD-10-CM

## 2017-08-22 DIAGNOSIS — A0472 Enterocolitis due to Clostridium difficile, not specified as recurrent: Secondary | ICD-10-CM

## 2017-08-22 LAB — BASIC METABOLIC PANEL
ANION GAP: 10 (ref 5–15)
BUN: 20 mg/dL (ref 6–20)
CO2: 25 mmol/L (ref 22–32)
Calcium: 8.1 mg/dL — ABNORMAL LOW (ref 8.9–10.3)
Chloride: 94 mmol/L — ABNORMAL LOW (ref 101–111)
Creatinine, Ser: 1 mg/dL (ref 0.61–1.24)
GFR calc Af Amer: >60 mL/min (ref >60)
Glucose, Bld: 109 mg/dL — ABNORMAL HIGH (ref 65–99)
POTASSIUM: 3.5 mmol/L (ref 3.5–5.1)
SODIUM: 129 mmol/L — AB (ref 135–145)

## 2017-08-22 LAB — CBC
HCT: 27.4 % — ABNORMAL LOW (ref 39.0–52.0)
Hemoglobin: 8.9 g/dL — ABNORMAL LOW (ref 13.0–17.0)
MCH: 28.8 pg (ref 26.0–34.0)
MCHC: 32.5 g/dL (ref 30.0–36.0)
MCV: 88.7 fL (ref 78.0–100.0)
PLATELETS: 276 10*3/uL (ref 150–400)
RBC: 3.09 MIL/uL — AB (ref 4.22–5.81)
RDW: 14.8 % (ref 11.5–15.5)
WBC: 25 10*3/uL — ABNORMAL HIGH (ref 4.0–10.5)

## 2017-08-22 MED ORDER — SODIUM CHLORIDE 0.9 % IR SOLN: 200.0000 mL | Status: DC

## 2017-08-22 MED ORDER — VANCOMYCIN 50 MG/ML ORAL SOLUTION
125.0000 mg | Freq: Four times a day (QID) | ORAL | Status: DC
  Administered 2017-08-22 – 2017-08-24 (×9): 125 mg via ORAL
  Filled 2017-08-22 (×11): qty 2.5

## 2017-08-22 MED ORDER — SODIUM CHLORIDE 0.9 % IV SOLN
INTRAVENOUS | Status: DC
Start: 2017-08-22 — End: 2017-08-23
  Administered 2017-08-22: 12:00:00 via INTRAVENOUS

## 2017-08-22 NOTE — Progress Notes (Signed)
OT Cancellation Note  Patient Details Name: Marcus [name] MRN: [id number] DOB: 04/03/1966   Cancelled Treatment:    Reason Eval/Treat Not Completed: Patient declined, no reason specified. Despite max encouragement, pt declining to work with therapy today. Will follow up as time allows.  Gaye AlkenBailey A Zawadi Aplin M.S., OTR/L Pager: [phone]  08/22/2017, 2:36 PM

## 2017-08-22 NOTE — Progress Notes (Signed)
PT Cancellation Note  Patient Details Name: Marcus ReapBryon T [name] MRN: [id number] DOB: 12/23/1965   Cancelled Treatment:    Reason Eval/Treat Not Completed: Patient declined, no reason specified.  I've had a bad day, I've got a tube up my butt... 08/22/2017  [place] BingKen Sarahgrace Broman, PT [phone]  (pager)   Eliseo GumKenneth V Tal Neer 08/22/2017, 2:37 PM

## 2017-08-22 NOTE — Progress Notes (Addendum)
[address]       [place] [id number]             [phone]     CARDIOTHORACIC SURGERY PROGRESS NOTE  1 Day Post-Op  S/P Procedure(s) (LRB): EXTRACTION OF TOOTH #'S 1,2,3,5,6,10,11,14,15,20-29 WITH ALVEOLOPLASTY, MAXILLARY LEFT BUCCAL EXOSTOSES REDUCTIONS AND BILATERAL MANDIBULAR TORI REDUCTIONS (N/A)  Subjective: Denies SOB.  Having diarrhea.  Mild abdominal discomfort.  Back pain persists.  Objective: Vital signs in last 24 hours: Temp:  [98.5 F (36.9 C)-99.1 F (37.3 C)] 99.1 F (37.3 C) (03/22 0508) Pulse Rate:  [86-98] 90 (03/22 0508) Cardiac Rhythm: Normal sinus rhythm (03/22 0700) Resp:  [10-23] 18 (03/22 0508) BP: (90-104)/(56-66) 97/66 (03/22 0508) SpO2:  [94 %-100 %] 94 % (03/22 0508) Weight:  [123 lb 10.9 oz (56.1 kg)] 123 lb 10.9 oz (56.1 kg) (03/22 [id number])  Physical Exam:  Rhythm:   sinus  Breath sounds: Fairly clear, diminished at bases  Heart sounds:  RRR w/ holosystolic murmur  Incisions:  n/a  Abdomen:  Soft, non tender  Extremities:  Warm, well-perfused   Intake/Output from previous day: 03/21 0701 - 03/22 0700 In: 2298 [P.O.:998; I.V.:1200; IV Piggyback:100] Out: 426 [Urine:325; Stool:1; Blood:100] Intake/Output this shift: No intake/output data recorded.  Lab Results: Recent Labs    08/21/17 0407 08/22/17 0404  WBC 26.2* 25.0*  HGB 9.9* 8.9*  HCT 30.6* 27.4*  PLT 286 276   BMET:  Recent Labs    08/21/17 0407 08/22/17 0404  NA 128* 129*  K 3.2* 3.5  CL 93* 94*  CO2 23 25  GLUCOSE 114* 109*  BUN 16 20  CREATININE 1.02 1.00  CALCIUM 8.2* 8.1*    CBG (last 3)  No results for input(s): GLUCAP in the last 72 hours. PT/INR:  No results for input(s): LABPROT, INR in the last 72 hours.  CXR:  N/A   RIGHT/LEFT HEART CATH AND CORONARY ANGIOGRAPHY  Conclusion     Mid RCA lesion is 25% stenosed.  Prox LAD lesion is 25% stenosed.  Ost 1st Diag lesion is 70% stenosed. This is a small vessel.  LV end  diastolic pressure is normal.  There is no aortic valve stenosis.  Hemodynamic findings consistent with mild pulmonary hypertension.  Ao sat 96%. PA sat 61%. CO 5.03 L/min. CI 2.91. Prominent V waves on the PCWP tracing.   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  Mild pulmonary hypertension with prominent V waves noted, consistent with severe mitral regurgitation.   Indications   Severe mitral regurgitation [I34.0 (ICD-10-CM)]  Acute systolic heart failure (HCC) [I50.21 (ICD-10-CM)]  Procedural Details/Technique   Technical Details The risks, benefits, and details of the procedure were explained to the patient. The patient verbalized understanding and wanted to proceed. Informed written consent was obtained.  PROCEDURE TECHNIQUE: After Xylocaine anesthesia, a 4/5 French sheath was placed in the left antecubital area in exchange for a peripheral IV. After Xylocaine anesthesia, a 63F sheath was placed in the right radial artery with a single anterior needle wall stick. A 7 French balloontipped Swan-Ganz catheter was advanced to the pulmonary artery under fluoroscopic guidance. Hemodynamic pressures were obtained. Oxygen saturations were obtained. Left heart cath was done using a JR4 catheter, when it crossed the aortic valve during manipulation to try to engage the RCA. Pressures were obtained with the catheter just across the aortic valve. Right coronary angiography was done using a Judkins R4 guide catheter. Catheter tended to select the conus branch.  Left coronary angiography was done using a Judkins L3.5 guide catheter.     Contrast: 60 cc     Estimated blood loss <50 mL.  During this procedure the patient was administered the following to achieve and maintain moderate conscious sedation: Versed 2 mg, Fentanyl 50 mcg, while the patient's heart rate, blood pressure, and oxygen saturation were continuously monitored. The period of conscious sedation was 37 minutes, of which I was present  face-to-face 100% of this time.  Complications   Complications documented before study signed (08/19/2017 8:43 AM EDT)    No complications were associated with this study.  Documented by Corky Crafts, MD - 08/19/2017 8:39 AM EDT    Coronary Findings   Diagnostic  Dominance: Right  Left Anterior Descending  Prox LAD lesion 25% stenosed  Prox LAD lesion is 25% stenosed.  First Diagonal Branch  Vessel is small in size.  Ost 1st Diag lesion 70% stenosed  Ost 1st Diag lesion is 70% stenosed.  Right Coronary Artery  Mid RCA lesion 25% stenosed  Mid RCA lesion is 25% stenosed.  Intervention   No interventions have been documented.  Right Heart   Right Heart Pressures Hemodynamic findings consistent with mild pulmonary hypertension. Ao sat 96%. PA sat 61%. CO 5.03 L/min. CI 2.91. Prominent V waves on the PCWP tracing.  Left Heart   Left Ventricle LV end diastolic pressure is normal.  Aortic Valve There is no aortic valve stenosis.  Coronary Diagrams   Diagnostic Diagram       Implants    No implant documentation for this case.  MERGE Images   Show images for CARDIAC CATHETERIZATION   Link to Procedure Log   Procedure Log    Hemo Data    Most Recent Value  Fick Cardiac Output 5.03 L/min  Fick Cardiac Output Index 2.91 (L/min)/BSA  RA A Wave 11 mmHg  RA V Wave 9 mmHg  RA Mean 8 mmHg  RV Systolic Pressure 36 mmHg  RV Diastolic Pressure 5 mmHg  RV EDP 10 mmHg  PA Systolic Pressure 44 mmHg  PA Diastolic Pressure 23 mmHg  PA Mean 32 mmHg  PW A Wave 21 mmHg  PW V Wave 37 mmHg  PW Mean 24 mmHg  AO Systolic Pressure 86 mmHg  AO Diastolic Pressure 62 mmHg  AO Mean 74 mmHg  LV Systolic Pressure 87 mmHg  LV Diastolic Pressure 18 mmHg  LV EDP 12 mmHg  Arterial Occlusion Pressure Extended Systolic Pressure 88 mmHg  Arterial Occlusion Pressure Extended Diastolic Pressure 63 mmHg  Arterial Occlusion Pressure Extended Mean Pressure 74 mmHg  Left Ventricular  Apex Extended Systolic Pressure 84 mmHg  Left Ventricular Apex Extended Diastolic Pressure 12 mmHg  Left Ventricular Apex Extended EDP Pressure 15 mmHg  QP/QS 1  TPVR Index 11 HRUI  TSVR Index 26.11 HRUI  PVR SVR Ratio 0.12  TPVR/TSVR Ratio 0.42     Assessment/Plan: S/P Procedure(s) (LRB): EXTRACTION OF TOOTH #'S 1,2,3,5,6,10,11,14,15,20-29 WITH ALVEOLOPLASTY, MAXILLARY LEFT BUCCAL EXOSTOSES REDUCTIONS AND BILATERAL MANDIBULAR TORI REDUCTIONS (N/A)  Progress noted.  Mr Macomber is nowhere near being ready for surgery.  His CHF seems to be under reasonable control and PA pressures only mildly elevated on cath.  I ordered a CXR to f/u his pleural effusions, but it never got done for unclear reasons. He will not be considered a candidate for surgery under any circumstances until his colitis has completely resolved.  Will continue to follow intermittently, but please call if specific  questions arise.   I spent in excess of 15 minutes during the conduct of this hospital encounter and >50% of this time involved direct face-to-face encounter with the patient for counseling and/or coordination of their care.   Purcell Nails, MD 08/22/2017 8:37 AM

## 2017-08-22 NOTE — Progress Notes (Addendum)
Family Medicine Teaching Service Daily Progress Note Intern Pager: [phone]  Patient name: Marcus [name] Medical record number: [id number] Date of birth: [birth year] Age: 52 y.o. Gender: male  Primary Care Provider: Patient, No Pcp Per Consultants: CCM, ID, Cardiology, CVTS, Dental  Code Status: Full   Pt Overview and Major Events to Date:  3/09: Admit forsepsis thought to be secondary to HAP and new onsetHF,cultures obtained, broad-spectrum antibiotics initiated 3/10: PCCM consulted,tolerating BiPAP 3/10: thoracentesis 3/14: TEE with severe MR, CVTS consulted  3/21: full dental extraction  Assessment and Plan: Marcus [name] a 52 y.o.malepresenting with worsening SOB. 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  Endocarditis of mitral valve h/oembolic strokeand metabolic encephalopathy new onset heart failure Patient does have signs concerning for new onset heart failure 2/2 vavular dysfunction.Leukocytosis of 25.0 -cardiology consulted, appreciate recommendations:lasix 40mg  daily -daily weight -strict Is and Os -CVTS consulted, appreciate recommendations: not candidate for surgery until colitis is completely resolved   -continue ancef (3/15-) -ID following: keep on ancef pending CVTS sx (end date 09/24/17) -Dentistry consulted,appreciate recommendations: s/p full extraction  Osteomyelitis MRI on spine on 08/19/17 showing T7-T8 discitis-osteomyelitis without abscess and C6-7 suspicious for discitis-osteomyelitis without abscess. Mild spinal stenosis of T7-8 also seen.  -ID following, appreciate recommendations: continue abx as described above, no MRI needed   Anemia Chronic, stable.Current Hgb8.9. Appears to be chronically low with baseline of 8-9. Normocytic  -continue to monitor with daily CBC  Left knee edema h/o left MSSA septic arthritis Presented with lower extremity  edema. Does have recent septic arthritis of the knee.DVT r/o with LE dopplers. -continue to monitor   Hepatitis C genotype 2B Noted during previous hospitalization. No prior treatment. Recent viral load 224K.LFTsstable.  -ID consulted, appreciate recommendations: recommended outpatient tx   Compression deformities of mid to lower thoracic spine Patientreports history of fall back in January 2019he continues to have persistent low back pain since discharge. Patient reports use of OxyContin and oxycodone for pain control though no medications per chart review. PT recommending SNF.  -continuepercocet 5-325 q6H scheduled, wean as tolerated -MRI spineunder general anesthesia  Polysubstance abuse UDS positive for barbiturates and opiates on presentation. Also has history of cocaine back in January 2019. Has known endocarditis likely from IV drug use though patient reports no recentillicit drug use. CIWA not measured overnight. COWSnot measured overnight. -Continue onCIWA  -Daily folate and thiamine -monitor COWS score -percocet 5 Q6, can plan to wean as tolerated  Hyponatremia Current Naimproved to 129.Likely 2/2 poor oral intake as patient has been NPO on and off for several days and likely had poor oral intake prior to admission. -monitor on daily BMP -encourage POintake -NS @ 75cc/hr  Hypokalemia-resolved K of3.5 -replete with KDur as needed -monitor on daily BMP  Protein calorie malnutrition  Albumin of 2.3 on 3/16. Likely 2/2 poor oral intake -consult nutrition, appreciate recommendations  C-diff -enteric precautions -PO vancomycin for same time as systemic therapy for MSSA and probably at least 1 week beyond per ID recommendations   Disposition Recommended by therapy for inpatient rehab. Rehab admission coordinator recommending likely SNF due to need for longer term care and PICC line management.  -CSW consulted appreciate recommendations: state  patient will have difficult placement due to insurance and history of leaving SNF AMA   FEN/GI:soft diet, NS @75cc /hr JYN:WGNF,AOZH discontinuedlovenoxfor dental procedure.  Disposition: will likely be dc to SNF when medically stable   Subjective:  Patient  today very upset. States he is still having diarrhea. When c-diff was explained to patient patient more understanding. Patient upset that so many doctors have come to see him in am stating "I am not a morning person". Denies SOB. States some continued back pain and "a little" mouth pain.   Objective: Temp:  [98.5 F (36.9 C)-99.1 F (37.3 C)] 99.1 F (37.3 C) (03/22 0508) Pulse Rate:  [86-98] 90 (03/22 0508) Resp:  [17-18] 18 (03/22 0508) BP: (90-100)/(65-66) 97/66 (03/22 0508) SpO2:  [94 %-100 %] 94 % (03/22 0508) Weight:  [123 lb 10.9 oz (56.1 kg)] 123 lb 10.9 oz (56.1 kg) (03/22 7846) Physical Exam: General: awake and alert, laying in bed, NAD  Cardiovascular: RRR, grade 3 murmur  Respiratory: CTAB, no wheezes, rales or rhonchi  Abdomen: soft, tender, non distended Extremities: non tender, no edema   Laboratory: Recent Labs  Lab 08/20/17 0319 08/21/17 0407 08/22/17 0404  WBC 22.4* 26.2* 25.0*  HGB 9.4* 9.9* 8.9*  HCT 30.3* 30.6* 27.4*  PLT 284 286 276   Recent Labs  Lab 08/16/17 0357  08/20/17 0319 08/21/17 0407 08/22/17 0404  NA 132*   < > 132* 128* 129*  K 3.9   < > 3.7 3.2* 3.5  CL 95*   < > 97* 93* 94*  CO2 26   < > 24 23 25   BUN 15   < > 18 16 20   CREATININE 1.15   < > 1.06 1.02 1.00  CALCIUM 8.4*   < > 8.1* 8.2* 8.1*  PROT 6.8  --   --   --   --   BILITOT 0.4  --   --   --   --   ALKPHOS 101  --   --   --   --   ALT <5*  --   --   --   --   AST 20  --   --   --   --   GLUCOSE 95   < > 132* 114* 109*   < > = values in this interval not displayed.    Imaging/Diagnostic Tests: Dg Orthopantogram  Result Date: 08/15/2017 CLINICAL DATA:  Poor dentition.  Cardiac workup. EXAM:  ORTHOPANTOGRAM/PANORAMIC COMPARISON:  None. FINDINGS: There are numerous missing maxillary and mandibular teeth, and multiple remaining maxillary teeth appear broken. There is mild periapical lucency associated with a left maxillary premolar tooth. IMPRESSION: Poor dentition with multiple missing and broken teeth. Mild periapical lucency associated with a left maxillary premolar tooth, abscess not excluded. Electronically Signed   By: Sebastian Ache M.D.   On: 08/15/2017 09:12   Dg Chest 2 View  Result Date: 08/14/2017 CLINICAL DATA:  Followup pneumonia. EXAM: CHEST - 2 VIEW COMPARISON:  08/10/2016 and older studies. FINDINGS: Moderate right and small left pleural effusions. There is associated lung base opacity, greater on the right, which is consistent with atelectasis, pneumonia or a combination. The patchy airspace opacity noted along the perihilar left lung on the prior study has improved. Airspace opacity in the right mid to lower lung appears somewhat less confluent but is unchanged in extent. No new lung abnormalities.  No pneumothorax. IMPRESSION: 1. Mild improvement in lung consolidation compared to the prior exam. 2. Persistent moderate right and small left pleural effusions. No new abnormalities. Electronically Signed   By: Amie Portland M.D.   On: 08/14/2017 13:16   Dg Chest 2 View  Addendum Date: 08/09/2017   ADDENDUM REPORT: 08/09/2017 15:41 ADDENDUM: After further  review, there are compression deformities of 2 adjacent vertebral bodies in the mid to lower thoracic spine, of uncertain age but new compared to an older chest x-ray 02/15/2005. Would consider further characterization with CT. These results were called by telephone at the time of interpretation on 08/09/2017 at 3:40 pm to Dr. Doug Sou , who verbally acknowledged these results. Electronically Signed   By: Bary Richard M.D.   On: 08/09/2017 15:41   Result Date: 08/09/2017 CLINICAL DATA:  Shortness of breath for 2 days. Bacterial  meningitis diagnosed at the beginning of January, than recently diagnosed with pneumonia. History of TIA in January with left-sided weakness. Former smoker. EXAM: CHEST - 2 VIEW COMPARISON:  Chest x-ray dated 06/08/2017. FINDINGS: New dense opacity at the right lung base, likely a combination of consolidation and pleural effusion. Pleural effusion component is at least moderate in size. Patchy opacities are seen within the left perihilar and lower lung zones, favor edema. Additional opacity at the left lung base is likely a combination of atelectasis and small pleural effusion. Heart size and mediastinal contours are stable. Osseous structures about the chest are unremarkable. IMPRESSION: 1. New dense opacity at the right lung base, likely a combination of pneumonia or atelectasis and pleural effusion. The pleural effusion component is at least moderate in size. Would consider chest CT for further characterization. 2. Additional patchy ill-defined opacities within the left mid and lower lung, most likely edema, pneumonia considered less likely. 3. Probable mild atelectasis and/or small pleural effusion at the left lung base. Electronically Signed: By: Bary Richard M.D. On: 08/09/2017 15:29   Ct Angio Chest Pe W And/or Wo Contrast  Result Date: 08/09/2017 CLINICAL DATA:  SOB x2 days. Pt diagnosed with bacterial meningitis at the beginning of January, then recently diagnosed with pneumonia. Pt also has hx of TIA beginning of January that left him with left sided weakness. Patient had left knee surgery x 6 weeks ago for sepsis. EXAM: CT ANGIOGRAPHY CHEST WITH CONTRAST TECHNIQUE: Multidetector CT imaging of the chest was performed using the standard protocol during bolus administration of intravenous contrast. Multiplanar CT image reconstructions and MIPs were obtained to evaluate the vascular anatomy. CONTRAST:  ISOVUE-370 IOPAMIDOL (ISOVUE-370) INJECTION 76% COMPARISON:  Current chest radiographs and prior  studies. FINDINGS: Cardiovascular: Satisfactory opacification of the pulmonary arteries to the segmental level. No evidence of pulmonary embolism. Heart is normal in size and configuration. No pericardial effusion. No coronary artery calcifications. The great vessels normal in caliber. No aortic atherosclerosis. No dissection. Mediastinum/Nodes: No neck base or axillary masses or pathologically enlarged lymph nodes. No mediastinal or hilar masses or discrete enlarged lymph nodes. The trachea is patent and normal in caliber. Esophagus is unremarkable. Lungs/Pleura: Large right and small left pleural effusions. Complete atelectasis of the right middle lobe and near complete atelectasis of the right lower lobe. Small area of consolidation in the anterior inferior right upper lobe. Patchy consolidation is noted in a peribronchovascular distribution in the left upper lobe and, to lesser degree, left lower lobe. There is dependent opacity also in the left lower lobe consistent with atelectasis. No pneumothorax. Upper Abdomen: No acute abnormality. Musculoskeletal: There is marked narrowing of the T6-T7 disc space with resorption/irregularity of the lower endplate of T6 in upper endplate of T7, with mild loss of height of these vertebra. This leads to a mild focal kyphosis. This is new since a chest radiograph dated 02/15/2005. A subtle lucency across the T6 spinous process posteriorly suggests a previous  fracture. All remaining vertebral bodies are normal in height. No osteoblastic or osteolytic lesions. Review of the MIP images confirms the above findings. IMPRESSION: 1. Patchy areas of consolidation in both lungs consistent with multifocal pneumonia. 2. Complete atelectasis of the right middle lobe and near complete atelectasis of the right lower lobe. Mild dependent atelectasis in the left lower lobe. 3. Large right and small left pleural effusions. 4. Marked loss of the disc space at T6-C7 with irregular resorption of  endplates leading to loss of vertebral body height of T6 and T7 and a focal kyphosis. This may reflect the sequelae of an old fracture. It could be due to previous discitis/osteomyelitis. Electronically Signed   By: Amie Portlandavid  Ormond M.D.   On: 08/09/2017 18:44   Mr Brain Wo Contrast  Result Date: 08/14/2017 CLINICAL DATA:  52 y/o  M; stroke follow-up. EXAM: MRI HEAD WITHOUT CONTRAST TECHNIQUE: Axial DWI sequence was acquired. Patient combative, unable to continue examination. COMPARISON:  06/09/2017 MRI head.  06/08/2017 CT head. FINDINGS: Low B value DWI sequence demonstrates areas of T2 hyperintensity in the right greater than left frontal, parietal, and occipital lobes as well as splenium of corpus callosum and cerebellum corresponding to infarctions on prior MRI. The high B value sequence is motion degraded, insufficient to assess for reduced diffusion and acute stroke. IMPRESSION: Severe motion degraded DWI sequence insufficient to assess for acute stroke. Repeat imaging is recommended when patient is able to hold still and follow commands. Electronically Signed   By: Mitzi HansenLance  Furusawa-Stratton M.D.   On: 08/14/2017 21:38   Mr Laqueta JeanBrain W JXWo Contrast  Result Date: 08/19/2017 CLINICAL DATA:  Hospitalized in January with bacteremia, mitral valve endocarditis, CNS septic emboli, and thoracic spine discitis. Persistent back pain. EXAM: MRI HEAD WITHOUT AND WITH CONTRAST MRI THORACIC SPINE WITHOUT AND WITH CONTRAST MRI LUMBAR SPINE WITHOUT AND WITH CONTRAST TECHNIQUE: Multiplanar, multiecho pulse sequences of the brain and surrounding structures, and cervical spine, to include the craniocervical junction and cervicothoracic junction, were obtained without and with intravenous contrast. Multiplanar and multiecho pulse sequences of the thoracic and lumbar spine were obtained without and with intravenous contrast. CONTRAST:  9mL MULTIHANCE GADOBENATE DIMEGLUMINE 529 MG/ML IV SOLN COMPARISON:  Brain MRI 06/09/2017. CTA  chest, abdomen, and pelvis 08/14/2017. Lumbar spine CT 12/30/2015. FINDINGS: MRI HEAD FINDINGS Brain: Embolic infarcts throughout the brain on the prior MRI demonstrate interval evolution. Associated diffusion abnormality has decreased though not completely resolved in some of the areas of infarction, and there is developing encephalomalacia. Associated chronic blood products are present with the majority of the infarcts. Multiple infarcts demonstrate enhancement. No definite restricted diffusion is seen to clearly indicate acute infarction. There are a few subcentimeter infarcts in the left centrum semiovale which are new from the prior MRI and demonstrate mild trace diffusion abnormality without reduced ADC, consistent with interval subacute infarcts. No intracranial mass, midline shift, or extra-axial fluid collection is seen. There is mild cerebral atrophy. No abnormal meningeal enhancement is identified. Vascular: Major intracranial vascular flow voids are preserved. Skull and upper cervical spine: Unremarkable bone marrow signal. Sinuses/Orbits: Unremarkable orbits. Clear paranasal sinuses. Trace left mastoid fluid. Other: None. MRI THORACIC SPINE FINDINGS Spinal numbering is performed by counting down from the craniocervical junction. This results in different numbering than what was reported on the prior CTA due to transitional lumbosacral anatomy. Some sequences are up to moderately motion degraded. Alignment: Slight focal kyphosis at T7-8.  No listhesis. Vertebrae: Disc and endplate destruction at T7-8 as  seen on prior CT with diffuse enhancement throughout the residual disc space and marrow of both T7 and T8 vertebral bodies extending into the pedicles. Minimal dorsal epidural enhancement at T7-8 without evidence of epidural abscess. Partially visualized marrow edema and enhancement in the C6 and C7 vertebral bodies with endplate irregularity, disc space narrowing, and enhancement anteriorly in the disc  space. No evidence of C6-7 epidural abscess on limited sagittal imaging. Very mild endplate edema and enhancement at T11-12 favored to be degenerative given lack of disc abnormality or endplate erosion to suggest infection. Background diminished T1 bone marrow signal intensity diffusely is likely related to patient's known anemia. 1.4 cm enhancing lesion in the posterior elements on the right at T3 is consistent with an hemangioma. A smaller atypical hemangioma is also suspected in the right T2 pedicle. Cord:  Normal signal. Paraspinal and other soft tissues: Paravertebral soft tissue edema/phlegmon from T6-T8. Interspinous soft tissue edema and enhancement at T7-8. No paraspinal abscess identified. Partially visualized prevertebral inflammation in the lower cervical spine. Moderate right and small left pleural effusions with associated compressive atelectasis on the right. Small volume fluid in the esophagus. Partially visualized endotracheal tube and tracheal secretions. Disc levels: Moderate disc space narrowing, disc bulging, and endplate spurring at C7-T1 result in moderate right and mild-to-moderate left neural foraminal stenosis and at most mild spinal stenosis. Broad-based posterior disc osteophyte complex and facet hypertrophy at T7-8 result in mild spinal stenosis without cord compression. Mild disc bulging and scattered tiny disc protrusions elsewhere in the thoracic spine do not result in spinal stenosis. MRI LUMBAR SPINE FINDINGS Some sequences are mildly motion degraded. Segmentation: Transitional lumbosacral anatomy with a lumbarized S1. Alignment: Facet mediated anterolisthesis of L5 on S1 measuring 4 mm. Vertebrae: Diffusely diminished T1 bone marrow signal intensity likely related to anemia. Predominantly type 2 degenerative endplate changes at S1-2. Mild type 1 changes at L3-4. No evidence of infectious discitis-osteomyelitis. No fracture. Conus medullaris: Extends to the L2 level and appears  normal. Paraspinal and other soft tissues: Mild nonspecific posterior paraspinal muscle edema bilaterally in the mid and lower lumbar spine. No fluid collection. Disc levels: L1-2: Negative. L2-3: Minimal disc bulging without stenosis. L3-4: Mild disc space narrowing. Mild disc bulging and mild facet and ligamentum flavum hypertrophy result in mild bilateral lateral recess stenosis and mild left neural foraminal stenosis without significant spinal stenosis. L4-5: Mild disc space narrowing. Mild disc bulging asymmetric to the right and moderate right and mild left facet hypertrophy result in mild right lateral recess stenosis and mild right neural foraminal stenosis without significant spinal stenosis. L5-S1: Anterolisthesis with disc uncovering and severe facet hypertrophy result in mild spinal stenosis, mild-to-moderate bilateral lateral recess stenosis, and mild-to-moderate bilateral neural foraminal stenosis. S1-2: Transitional level with disc space narrowing. Mild disc bulging and moderate facet arthrosis result in mild left neural foraminal stenosis without spinal stenosis. IMPRESSION: 1. 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 2. Interval tiny subacute infarcts in the left centrum semiovale. No acute infarct. 3. Transitional lumbosacral anatomy as above. 4. 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 No epidural or paraspinal abscess. 5. Partially visualized disc and endplate enhancement at C6-7 suspicious for discitis-osteomyelitis with prevertebral inflammation. No evidence of epidural abscess though this was incompletely imaged. 6. Mild spinal stenosis at T7-8. 7. No evidence of discitis-osteomyelitis in the lumbar spine. 8. Nonspecific bilateral lumbar posterior paraspinal muscle edema without abscess. 9. Lumbar disc and facet degeneration most notable at L5-S1 where there is grade  1 anterolisthesis, mild spinal stenosis, and mild-to-moderate  lateral recess and neural foraminal stenosis. 10. Moderate right and small left pleural effusions. Electronically Signed   By: Sebastian Ache M.D.   On: 08/19/2017 15:55   Mr Thoracic Spine W Wo Contrast  Result Date: 08/19/2017 CLINICAL DATA:  Hospitalized in January with bacteremia, mitral valve endocarditis, CNS septic emboli, and thoracic spine discitis. Persistent back pain. EXAM: MRI HEAD WITHOUT AND WITH CONTRAST MRI THORACIC SPINE WITHOUT AND WITH CONTRAST MRI LUMBAR SPINE WITHOUT AND WITH CONTRAST TECHNIQUE: Multiplanar, multiecho pulse sequences of the brain and surrounding structures, and cervical spine, to include the craniocervical junction and cervicothoracic junction, were obtained without and with intravenous contrast. Multiplanar and multiecho pulse sequences of the thoracic and lumbar spine were obtained without and with intravenous contrast. CONTRAST:  9mL MULTIHANCE GADOBENATE DIMEGLUMINE 529 MG/ML IV SOLN COMPARISON:  Brain MRI 06/09/2017. CTA chest, abdomen, and pelvis 08/14/2017. Lumbar spine CT 12/30/2015. FINDINGS: MRI HEAD FINDINGS Brain: Embolic infarcts throughout the brain on the prior MRI demonstrate interval evolution. Associated diffusion abnormality has decreased though not completely resolved in some of the areas of infarction, and there is developing encephalomalacia. Associated chronic blood products are present with the majority of the infarcts. Multiple infarcts demonstrate enhancement. No definite restricted diffusion is seen to clearly indicate acute infarction. There are a few subcentimeter infarcts in the left centrum semiovale which are new from the prior MRI and demonstrate mild trace diffusion abnormality without reduced ADC, consistent with interval subacute infarcts. No intracranial mass, midline shift, or extra-axial fluid collection is seen. There is mild cerebral atrophy. No abnormal meningeal enhancement is identified. Vascular: Major intracranial vascular flow  voids are preserved. Skull and upper cervical spine: Unremarkable bone marrow signal. Sinuses/Orbits: Unremarkable orbits. Clear paranasal sinuses. Trace left mastoid fluid. Other: None. MRI THORACIC SPINE FINDINGS Spinal numbering is performed by counting down from the craniocervical junction. This results in different numbering than what was reported on the prior CTA due to transitional lumbosacral anatomy. Some sequences are up to moderately motion degraded. Alignment: Slight focal kyphosis at T7-8.  No listhesis. Vertebrae: Disc and endplate destruction at T7-8 as seen on prior CT with diffuse enhancement throughout the residual disc space and marrow of both T7 and T8 vertebral bodies extending into the pedicles. Minimal dorsal epidural enhancement at T7-8 without evidence of epidural abscess. Partially visualized marrow edema and enhancement in the C6 and C7 vertebral bodies with endplate irregularity, disc space narrowing, and enhancement anteriorly in the disc space. No evidence of C6-7 epidural abscess on limited sagittal imaging. Very mild endplate edema and enhancement at T11-12 favored to be degenerative given lack of disc abnormality or endplate erosion to suggest infection. Background diminished T1 bone marrow signal intensity diffusely is likely related to patient's known anemia. 1.4 cm enhancing lesion in the posterior elements on the right at T3 is consistent with an hemangioma. A smaller atypical hemangioma is also suspected in the right T2 pedicle. Cord:  Normal signal. Paraspinal and other soft tissues: Paravertebral soft tissue edema/phlegmon from T6-T8. Interspinous soft tissue edema and enhancement at T7-8. No paraspinal abscess identified. Partially visualized prevertebral inflammation in the lower cervical spine. Moderate right and small left pleural effusions with associated compressive atelectasis on the right. Small volume fluid in the esophagus. Partially visualized endotracheal tube and  tracheal secretions. Disc levels: Moderate disc space narrowing, disc bulging, and endplate spurring at C7-T1 result in moderate right and mild-to-moderate left neural foraminal stenosis and at most mild  spinal stenosis. Broad-based posterior disc osteophyte complex and facet hypertrophy at T7-8 result in mild spinal stenosis without cord compression. Mild disc bulging and scattered tiny disc protrusions elsewhere in the thoracic spine do not result in spinal stenosis. MRI LUMBAR SPINE FINDINGS Some sequences are mildly motion degraded. Segmentation: Transitional lumbosacral anatomy with a lumbarized S1. Alignment: Facet mediated anterolisthesis of L5 on S1 measuring 4 mm. Vertebrae: Diffusely diminished T1 bone marrow signal intensity likely related to anemia. Predominantly type 2 degenerative endplate changes at S1-2. Mild type 1 changes at L3-4. No evidence of infectious discitis-osteomyelitis. No fracture. Conus medullaris: Extends to the L2 level and appears normal. Paraspinal and other soft tissues: Mild nonspecific posterior paraspinal muscle edema bilaterally in the mid and lower lumbar spine. No fluid collection. Disc levels: L1-2: Negative. L2-3: Minimal disc bulging without stenosis. L3-4: Mild disc space narrowing. Mild disc bulging and mild facet and ligamentum flavum hypertrophy result in mild bilateral lateral recess stenosis and mild left neural foraminal stenosis without significant spinal stenosis. L4-5: Mild disc space narrowing. Mild disc bulging asymmetric to the right and moderate right and mild left facet hypertrophy result in mild right lateral recess stenosis and mild right neural foraminal stenosis without significant spinal stenosis. L5-S1: Anterolisthesis with disc uncovering and severe facet hypertrophy result in mild spinal stenosis, mild-to-moderate bilateral lateral recess stenosis, and mild-to-moderate bilateral neural foraminal stenosis. S1-2: Transitional level with disc space  narrowing. Mild disc bulging and moderate facet arthrosis result in mild left neural foraminal stenosis without spinal stenosis. IMPRESSION: 1. 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 2. Interval tiny subacute infarcts in the left centrum semiovale. No acute infarct. 3. Transitional lumbosacral anatomy as above. 4. 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 No epidural or paraspinal abscess. 5. Partially visualized disc and endplate enhancement at C6-7 suspicious for discitis-osteomyelitis with prevertebral inflammation. No evidence of epidural abscess though this was incompletely imaged. 6. Mild spinal stenosis at T7-8. 7. No evidence of discitis-osteomyelitis in the lumbar spine. 8. Nonspecific bilateral lumbar posterior paraspinal muscle edema without abscess. 9. Lumbar disc and facet degeneration most notable at L5-S1 where there is grade 1 anterolisthesis, mild spinal stenosis, and mild-to-moderate lateral recess and neural foraminal stenosis. 10. Moderate right and small left pleural effusions. Electronically Signed   By: Sebastian Ache M.D.   On: 08/19/2017 15:55   Mr Lumbar Spine W Wo Contrast  Result Date: 08/19/2017 CLINICAL DATA:  Hospitalized in January with bacteremia, mitral valve endocarditis, CNS septic emboli, and thoracic spine discitis. Persistent back pain. EXAM: MRI HEAD WITHOUT AND WITH CONTRAST MRI THORACIC SPINE WITHOUT AND WITH CONTRAST MRI LUMBAR SPINE WITHOUT AND WITH CONTRAST TECHNIQUE: Multiplanar, multiecho pulse sequences of the brain and surrounding structures, and cervical spine, to include the craniocervical junction and cervicothoracic junction, were obtained without and with intravenous contrast. Multiplanar and multiecho pulse sequences of the thoracic and lumbar spine were obtained without and with intravenous contrast. CONTRAST:  9mL MULTIHANCE GADOBENATE DIMEGLUMINE 529 MG/ML IV SOLN COMPARISON:  Brain MRI  06/09/2017. CTA chest, abdomen, and pelvis 08/14/2017. Lumbar spine CT 12/30/2015. FINDINGS: MRI HEAD FINDINGS Brain: Embolic infarcts throughout the brain on the prior MRI demonstrate interval evolution. Associated diffusion abnormality has decreased though not completely resolved in some of the areas of infarction, and there is developing encephalomalacia. Associated chronic blood products are present with the majority of the infarcts. Multiple infarcts demonstrate enhancement. No definite restricted diffusion is seen to clearly indicate acute infarction. There are a few  subcentimeter infarcts in the left centrum semiovale which are new from the prior MRI and demonstrate mild trace diffusion abnormality without reduced ADC, consistent with interval subacute infarcts. No intracranial mass, midline shift, or extra-axial fluid collection is seen. There is mild cerebral atrophy. No abnormal meningeal enhancement is identified. Vascular: Major intracranial vascular flow voids are preserved. Skull and upper cervical spine: Unremarkable bone marrow signal. Sinuses/Orbits: Unremarkable orbits. Clear paranasal sinuses. Trace left mastoid fluid. Other: None. MRI THORACIC SPINE FINDINGS Spinal numbering is performed by counting down from the craniocervical junction. This results in different numbering than what was reported on the prior CTA due to transitional lumbosacral anatomy. Some sequences are up to moderately motion degraded. Alignment: Slight focal kyphosis at T7-8.  No listhesis. Vertebrae: Disc and endplate destruction at T7-8 as seen on prior CT with diffuse enhancement throughout the residual disc space and marrow of both T7 and T8 vertebral bodies extending into the pedicles. Minimal dorsal epidural enhancement at T7-8 without evidence of epidural abscess. Partially visualized marrow edema and enhancement in the C6 and C7 vertebral bodies with endplate irregularity, disc space narrowing, and enhancement  anteriorly in the disc space. No evidence of C6-7 epidural abscess on limited sagittal imaging. Very mild endplate edema and enhancement at T11-12 favored to be degenerative given lack of disc abnormality or endplate erosion to suggest infection. Background diminished T1 bone marrow signal intensity diffusely is likely related to patient's known anemia. 1.4 cm enhancing lesion in the posterior elements on the right at T3 is consistent with an hemangioma. A smaller atypical hemangioma is also suspected in the right T2 pedicle. Cord:  Normal signal. Paraspinal and other soft tissues: Paravertebral soft tissue edema/phlegmon from T6-T8. Interspinous soft tissue edema and enhancement at T7-8. No paraspinal abscess identified. Partially visualized prevertebral inflammation in the lower cervical spine. Moderate right and small left pleural effusions with associated compressive atelectasis on the right. Small volume fluid in the esophagus. Partially visualized endotracheal tube and tracheal secretions. Disc levels: Moderate disc space narrowing, disc bulging, and endplate spurring at C7-T1 result in moderate right and mild-to-moderate left neural foraminal stenosis and at most mild spinal stenosis. Broad-based posterior disc osteophyte complex and facet hypertrophy at T7-8 result in mild spinal stenosis without cord compression. Mild disc bulging and scattered tiny disc protrusions elsewhere in the thoracic spine do not result in spinal stenosis. MRI LUMBAR SPINE FINDINGS Some sequences are mildly motion degraded. Segmentation: Transitional lumbosacral anatomy with a lumbarized S1. Alignment: Facet mediated anterolisthesis of L5 on S1 measuring 4 mm. Vertebrae: Diffusely diminished T1 bone marrow signal intensity likely related to anemia. Predominantly type 2 degenerative endplate changes at S1-2. Mild type 1 changes at L3-4. No evidence of infectious discitis-osteomyelitis. No fracture. Conus medullaris: Extends to the L2  level and appears normal. Paraspinal and other soft tissues: Mild nonspecific posterior paraspinal muscle edema bilaterally in the mid and lower lumbar spine. No fluid collection. Disc levels: L1-2: Negative. L2-3: Minimal disc bulging without stenosis. L3-4: Mild disc space narrowing. Mild disc bulging and mild facet and ligamentum flavum hypertrophy result in mild bilateral lateral recess stenosis and mild left neural foraminal stenosis without significant spinal stenosis. L4-5: Mild disc space narrowing. Mild disc bulging asymmetric to the right and moderate right and mild left facet hypertrophy result in mild right lateral recess stenosis and mild right neural foraminal stenosis without significant spinal stenosis. L5-S1: Anterolisthesis with disc uncovering and severe facet hypertrophy result in mild spinal stenosis, mild-to-moderate bilateral lateral recess stenosis,  and mild-to-moderate bilateral neural foraminal stenosis. S1-2: Transitional level with disc space narrowing. Mild disc bulging and moderate facet arthrosis result in mild left neural foraminal stenosis without spinal stenosis. IMPRESSION: 1. 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 2. Interval tiny subacute infarcts in the left centrum semiovale. No acute infarct. 3. Transitional lumbosacral anatomy as above. 4. 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 No epidural or paraspinal abscess. 5. Partially visualized disc and endplate enhancement at C6-7 suspicious for discitis-osteomyelitis with prevertebral inflammation. No evidence of epidural abscess though this was incompletely imaged. 6. Mild spinal stenosis at T7-8. 7. No evidence of discitis-osteomyelitis in the lumbar spine. 8. Nonspecific bilateral lumbar posterior paraspinal muscle edema without abscess. 9. Lumbar disc and facet degeneration most notable at L5-S1 where there is grade 1 anterolisthesis, mild spinal stenosis, and  mild-to-moderate lateral recess and neural foraminal stenosis. 10. Moderate right and small left pleural effusions. Electronically Signed   By: Sebastian Ache M.D.   On: 08/19/2017 15:55   Dg Chest Port 1 View  Result Date: 08/10/2017 CLINICAL DATA:  Post right thora, 1.4 L removed EXAM: PORTABLE CHEST 1 VIEW COMPARISON:  Chest x-ray dated 08/09/2017. FINDINGS: Some improvement in aeration at the left lung base. Residual opacity is presumably atelectasis or pneumonia. Ill-defined opacities persist throughout the left lung, consistent with the multifocal pneumonia better demonstrated on chest CT of 08/09/2017. Additional persistent atelectasis at the left lung base. No pneumothorax. Heart size and mediastinal contours appear stable. IMPRESSION: 1. Improved aeration at the right lung base status post thoracentesis. Residual opacity is presumably atelectasis or pneumonia. No pneumothorax seen. 2. Stable patchy opacities throughout the left lung, compatible with the multifocal pneumonia demonstrated on yesterday's chest CT. Electronically Signed   By: Bary Richard M.D.   On: 08/10/2017 14:54   Ct Angio Chest/abd/pel For Dissection W And/or W/wo  Result Date: 08/14/2017 CLINICAL DATA:  Septic arterial embolism EXAM: CT ANGIOGRAPHY CHEST, ABDOMEN AND PELVIS TECHNIQUE: Multidetector CT imaging through the chest, abdomen and pelvis was performed using the standard protocol during bolus administration of intravenous contrast. Multiplanar reconstructed images and MIPs were obtained and reviewed to evaluate the vascular anatomy. CONTRAST:  100 cc ISOVUE-370 IOPAMIDOL (ISOVUE-370) INJECTION 76% COMPARISON:  Chest CT 08/09/2017 FINDINGS: CTA CHEST FINDINGS Cardiovascular: No acute pulmonary embolus. Satisfactory opacification of the aorta and pulmonary arteries. Normal size heart without pericardial effusion. Normal branch pattern of the great vessels off the arch. No coronary arteriosclerosis. No aortic atherosclerosis.  No aneurysm. No aortic dissection. Mediastinum/Nodes: No supraclavicular, axillary, mediastinal nor hilar lymphadenopathy. Trachea is patent and normal caliber without intraluminal abnormality. Esophagus is unremarkable. No thyromegaly is noted. Lungs/Pleura: Bilateral moderate-sized pleural effusions slightly smaller on the right with loculated component. Adjacent compressive atelectasis is noted. Patchy airspace opacities in the left upper lobe are redemonstrated, with slight improvement in aeration since prior though the vast majority of the pulmonary opacity still persists. Musculoskeletal: Redemonstration of marked disc space narrowing C6-7 with endplate irregularity of the inferior endplate of T6 superior endplate of T7 with resultant mild focal kyphosis. Review of the MIP images confirms the above findings. CTA ABDOMEN AND PELVIS FINDINGS VASCULAR Aorta: Normal caliber aorta without aneurysm, dissection, vasculitis or significant stenosis. Celiac: Patent without evidence of aneurysm, dissection, vasculitis or significant stenosis. SMA: Patent without evidence of aneurysm, dissection, vasculitis or significant stenosis. Renals: Single renal arteries bilaterally without acute abnormality. No significant stenosis, dissection or evidence of fibromuscular dysplasia. IMA: Patent Inflow: No aneurysm or dissection.  Minimal atherosclerotic plaque in the proximal right common iliac and both distal iliac arteries. No significant stenosis. No dissection or aneurysm. Veins: No obvious venous abnormality within the limitations of this arterial phase study. Review of the MIP images confirms the above findings. NON-VASCULAR Hepatobiliary: No focal liver abnormality is seen. No gallstones, gallbladder wall thickening, or biliary dilatation. Pancreas: Unremarkable. No pancreatic ductal dilatation or surrounding inflammatory changes. Spleen: Heterogeneous enhancement likely due to timing of contrast bolus. No splenomegaly.  Adrenals/Urinary Tract: No adrenal no renal masses. No nephrolithiasis nor obstructive uropathy. No hydroureteronephrosis. Unremarkable bladder. Stomach/Bowel: Nondistended stomach with normal small bowel rotation. Mild fluid-filled distention of small bowel loops with slight mural thickening of jejunum may reflect a mild small bowel enteritis. No mechanical source obstruction is seen. Colon is. Normal appearing appendix. Lymphatic: No abdominopelvic adenopathy. Reproductive: Normal size prostate and seminal vesicles. Other: Small amount of fluid in the left inguinal canal. Musculoskeletal: Plate and screw fixation of the left acetabulum. 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 No acute osseous abnormality. There is lower lumbar degenerative facet arthropathy. Minimal anterolisthesis of L4 on L5. Marked disc space narrowing at L5-S1. Review of the MIP images confirms the above findings. IMPRESSION: Chest CT: 1. No acute pulmonary embolus. 2. No aortic aneurysm or dissection. 3. Bilateral moderate-sized pleural effusions slightly smaller on the right with loculated component redemonstrated. Adjacent compressive atelectasis is seen both lower lobes. 4. Relatively unchanged patchy airspace opacity in the left upper lobe only minimally improved in aeration. 5. Redemonstration of focal kyphosis at T6-7 secondary to endplate irregularities and disc space narrowing suspicious for changes of discitis/osteomyelitis. MRI would be the study of choice for further correlation as to acuity. Abdomen and pelvic CT: 1. No aortic aneurysm or dissection. Patent branch vessels without significant stenosis or focal abnormality. 2. Mild fluid-filled distention of small bowel which transmural thickening suspicious for changes of mild enteritis. No mechanical bowel obstruction. Electronically Signed   By: Tollie Eth M.D.   On: 08/14/2017 21:21   US Thoracentesis Asp Pleural Space W/img Guide  Result Date:  08/10/2017 INDICATION: Respiratory failure. Large right pleural effusion. Request for diagnostic and therapeutic thoracentesis. EXAM: ULTRASOUND GUIDED RIGHT THORACENTESIS MEDICATIONS: None. COMPLICATIONS: None immediate. PROCEDURE: An ultrasound guided thoracentesis was thoroughly discussed with the patient and questions answered. The benefits, risks, alternatives and complications were also discussed. The patient understands and wishes to proceed with the procedure. Written consent was obtained. Ultrasound was performed to localize and mark an adequate pocket of fluid in the right chest. The area was then prepped and draped in the normal sterile fashion. 1% Lidocaine was used for local anesthesia. Under ultrasound guidance a Safe-T-Centesis catheter was introduced. Thoracentesis was performed. The catheter was removed and a dressing applied. FINDINGS: A total of approximately 1.4 L of pink/blood-tinged fluid was removed. Samples were sent to the laboratory as requested by the clinical team. IMPRESSION: Successful ultrasound guided right thoracentesis yielding 1.4 L of pleural fluid. Read by: Brayton El PA-C Electronically Signed   By: Corlis Leak M.D.   On: 08/10/2017 12:55     Oralia Manis, DO 08/22/2017, 11:18 AM PGY-1, [facility] FPTS Intern pager: [phone], text pages welcome

## 2017-08-22 NOTE — Progress Notes (Signed)
Progress Note  Patient Name: Marcus [name] Date of Encounter: 08/22/2017  Primary Cardiologist: Armanda Magic, MD   Subjective   No chest pain or dyspnea; complains of diarrhea and back pain  Inpatient Medications    Scheduled Meds: . enoxaparin (LOVENOX) injection  40 mg Subcutaneous Q24H  . feeding supplement (ENSURE ENLIVE)  237 mL Oral TID BM  . folic acid  1 mg Oral Daily  . furosemide  40 mg Intravenous Daily  . LORazepam  1 mg Intravenous Once  . multivitamin with minerals  1 tablet Oral Daily  . mupirocin ointment   Nasal BID  . oxyCODONE-acetaminophen  1 tablet Oral Q6H  . potassium chloride  20 mEq Oral Daily  . potassium chloride  40 mEq Oral Once  . sodium chloride flush  3 mL Intravenous Q12H  . thiamine  100 mg Oral Daily   Or  . thiamine  100 mg Intravenous Daily  . vancomycin  125 mg Oral QID   Continuous Infusions: . sodium chloride    .  ceFAZolin (ANCEF) IV Stopped (08/22/17 0256)  . lactated ringers 10 mL/hr at 08/21/17 1230   PRN Meds: sodium chloride, albuterol, ondansetron (ZOFRAN) IV   Vital Signs    Vitals:   08/21/17 1007 08/21/17 1444 08/21/17 1937 08/22/17 0508  BP: 104/63 100/65 90/65 97/66   Pulse: 98 98 86 90  Resp: (!) 23 17 18 18   Temp: 99 F (37.2 C) 98.7 F (37.1 C) 98.5 F (36.9 C) 99.1 F (37.3 C)  TempSrc:  Axillary Oral Oral  SpO2: 96% 100% 98% 94%  Weight:    123 lb 10.9 oz (56.1 kg)  Height:        Intake/Output Summary (Last 24 hours) at 08/22/2017 0801 Last data filed at 08/22/2017 1610 Gross per 24 hour  Intake 2298 ml  Output 426 ml  Net 1872 ml   Filed Weights   08/20/17 0446 08/21/17 0507 08/22/17 0508  Weight: 110 lb 3.7 oz (50 kg) 124 lb 12.5 oz (56.6 kg) 123 lb 10.9 oz (56.1 kg)    Telemetry    NSR- Personally Reviewed   Physical Exam   GEN: WD, frail chronically ill appearing Neck: supple Cardiac: RRR, 3/6 systolic murmur apex radiating to left axilla Respiratory: diminished BS bases GI:  Soft, NT/ND MS: No edema Neuro:  No focal findings   Labs    Chemistry Recent Labs  Lab 08/15/17 1054 08/16/17 0357  08/20/17 0319 08/21/17 0407 08/22/17 0404  NA 136 132*   < > 132* 128* 129*  K 4.2 3.9   < > 3.7 3.2* 3.5  CL 101 95*   < > 97* 93* 94*  CO2 26 26   < > 24 23 25   GLUCOSE 124* 95   < > 132* 114* 109*  BUN 14 15   < > 18 16 20   CREATININE 1.16 1.15   < > 1.06 1.02 1.00  CALCIUM 8.5* 8.4*   < > 8.1* 8.2* 8.1*  PROT 6.7 6.8  --   --   --   --   ALBUMIN 2.2* 2.3*  --   --   --   --   AST 22 20  --   --   --   --   ALT <5* <5*  --   --   --   --   ALKPHOS 88 101  --   --   --   --   BILITOT 0.5 0.4  --   --   --   --  GFRNONAA >60 >60   < > >60 >60 >60  GFRAA >60 >60   < > >60 >60 >60  ANIONGAP 9 11   < > 11 12 10    < > = values in this interval not displayed.     Hematology Recent Labs  Lab 08/20/17 0319 08/21/17 0407 08/22/17 0404  WBC 22.4* 26.2* 25.0*  RBC 3.37* 3.45* 3.09*  HGB 9.4* 9.9* 8.9*  HCT 30.3* 30.6* 27.4*  MCV 89.9 88.7 88.7  MCH 27.9 28.7 28.8  MCHC 31.0 32.4 32.5  RDW 14.7 14.8 14.8  PLT 284 286 276     Patient Profile     52 y.o. 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 Continue antibiotics.  ID following. Patient has had teeth extraction.  Cath has been performed. Plans for possible mitral valve replacement at later date but needs significant improvement prior to surgery; now also with Cdiff; needs nutrition and physical therapy.  2 acute diastolic congestive heart failure-improved; continue lasix 40 mg daily and follow.  3 severe mitral regurgitation-for possible MVR when overall status improves.  4 severe malnutrition-needs improved nutrition and physical therapy prior to MVR.  5 substance abuse-patient previously counseled on avoiding.  6 C diff-continue vancomycin  For questions  or updates, please contact [facility] Please consult [email or web address] for contact info under Cardiology/STEMI.      Signed, Olga MillersBrian Crenshaw, MD  08/22/2017, 8:01 AM

## 2017-08-22 NOTE — Progress Notes (Signed)
POST OPERATIVE NOTE:  08/22/2017 Marcus [name] [id number]  VITALS: BP 97/66 (BP Location: Right Arm)   Pulse 90   Temp 99.1 F (37.3 C) (Oral)   Resp 18   Ht 5\' 11"  (1.803 m)   Wt 123 lb 10.9 oz (56.1 kg)   SpO2 94%   BMI 17.25 kg/m   LABS:  Lab Results  Component Value Date   WBC 25.0 (H) 08/22/2017   HGB 8.9 (L) 08/22/2017   HCT 27.4 (L) 08/22/2017   MCV 88.7 08/22/2017   PLT 276 08/22/2017   BMET    Component Value Date/Time   NA 129 (L) 08/22/2017 0404   K 3.5 08/22/2017 0404   CL 94 (L) 08/22/2017 0404   CO2 25 08/22/2017 0404   GLUCOSE 109 (H) 08/22/2017 0404   BUN 20 08/22/2017 0404   CREATININE 1.00 08/22/2017 0404   CALCIUM 8.1 (L) 08/22/2017 0404   GFRNONAA >60 08/22/2017 0404   GFRAA >60 08/22/2017 0404    Lab Results  Component Value Date   INR 1.10 08/19/2017   INR 1.20 08/10/2017   INR 1.15 08/09/2017   No results found for: PTT   Marcus [name] is status post traction of remaining teeth with alveoloplasty and pre-prosthetic surgery in the operating room with general anesthesia on 08/21/2017.   SUBJECTIVE: Patient with minimal pain from extractions.  Patient denies any active bleeding.  EXAM: There is no sign of infection, heme, or ooze.  Sutures are intact.  Clots are present.  There is intraoral swelling and bruising noted.  ASSESSMENT: Post operative course is consistent with dental procedures performed in the operating room with general anesthesia. Patient is now completely edentulous. There is atrophy of the edentulous alveolar ridges.  PLAN: 1.  Use salt water rinses every 2 hours while awake. 2.  Advance diet as tolerated with the help of nutritional consultation. 3.  Patient to follow-up with a dentist of his choice for fabrication of upper and lower complete dentures after adequate healing and once medically stable from the anticipated heart valve surgery.   Charlynne Panderonald F. Britian Jentz, DDS

## 2017-08-22 NOTE — Progress Notes (Signed)
Nutrition Follow-up /Consult  DOCUMENTATION CODES:   Severe malnutrition in context of chronic illness, Underweight  INTERVENTION:   Magic cup (orange) TID with meals, each supplement provides 290kcals and 9g of protein.  Continue Ensure TID, each supplement provides 350kcals and 20g of protein. Continue MVI  NUTRITION DIAGNOSIS:   Severe Malnutrition related to chronic illness(polysubstance abuse) as evidenced by severe fat depletion, severe muscle depletion. Ongoing.  GOAL:   Patient will meet greater than or equal to 90% of their needs Not meeting.  MONITOR:   PO intake, Supplement acceptance, Skin, I & O's  REASON FOR ASSESSMENT:   Consult Assessment of nutrition requirement/status  ASSESSMENT:   52 yo male with PMH of arthritis, chronic back pain, endocarditis, MRSA bacteremia, polysubstance abuse, hepatitis C, mitral regurgitation, septic embolism, and malnutrition, who was admitted on 3/9 with worsening dyspnea, sepsis 2/2 HAP and new onset HF. S/P thoracentesis 3/10, 1.4 L removed.  3/20 C. Diff positive 3/21 - complete tooth extraction. CLD to soft diet at 1733.  Per flowsheets, intake appears to be highly variable. On average between 0-50%.  Pt reports continued poor appetite. He ate some soup and italian ice after his procedure yesterday; per flowsheets he ate 100%. Pt states he had bites of pancakes this morning with milk and orange juice.  Pt likes Ensure and states he is able to drink it 2x/day. Intern encouraged Ensure and meal intake to aid in improving pt status.  Pt amenable to receiving Magic cup.  Pt wt down 33lb since admit; likely due to fluid. Net negative 3.7L since admit, 1.4L from thoracentesis on 3/10  Medication: Folic acid, lasix, MVI, KCl, thiamine, NS IVF, cefazolin  Labs: Na (L;129), K now WNL  Diet Order:  DIET SOFT Room service appropriate? Yes; Fluid consistency: Thin  EDUCATION NEEDS:   No education needs have been  identified at this time  Skin:  Skin Assessment: Skin Integrity Issues: Skin Integrity Issues:: Other (Comment) Other: MASD to buttocks  Last BM:  3/17  Height:   Ht Readings from Last 1 Encounters:  08/10/17 5\' 11"  (1.803 m)    Weight:   Wt Readings from Last 1 Encounters:  08/22/17 123 lb 10.9 oz (56.1 kg)  3/10  156lb 1.4oz  Ideal Body Weight:  78.2 kg  BMI:  Body mass index is 17.25 kg/m.  Estimated Nutritional Needs:   Kcal:  2000-2200  Protein:  90-100 gm  Fluid:  1.5-2 L   Marcus Blizzard, MS, Dietetic Intern Pager # [phone]

## 2017-08-22 NOTE — Progress Notes (Signed)
Patient ID: Marcus [name], male   DOB: May 04, 1966, 52 y.o.   MRN: [id number]         [facility]  Date of Admission:  08/09/2017    Day 14 MSSA therapy        Day 2 oral vancomycin         ASSESSMENT: He was hospitalized in January with MSSA bacteremia complicated by mitral valve endocarditis, septic CNS emboli, meningitis, septic left knee and T6-7 discitis.  He completed 6 weeks of IV antibiotic therapy but is now back in the hospital with probable worsening of his mitral regurgitation, a persistent vegetation and persistent back pain.  Although all recent cultures have been negative I favor continuing treatment for probable persistent MSSA infection for now.  He also has superimposed [name] colitis.  PLAN: 1. Continue cefazolin oral vancomycin 2. Please call Dr. Judyann Munson [phone] for any infectious disease questions this weekend  Principal Problem:   Enteritis due to Clostridium difficile Active Problems:   Endocarditis of mitral valve   Acute heart failure (HCC)   Severe mitral regurgitation   HAP (hospital-acquired pneumonia)   Thoracic discitis   Hepatitis C antibody positive in blood   Sepsis (HCC)   Pleural effusion   Protein-calorie malnutrition, severe   Loose stools   Osteomyelitis (HCC)   Scheduled Meds: . enoxaparin (LOVENOX) injection  40 mg Subcutaneous Q24H  . feeding supplement (ENSURE ENLIVE)  237 mL Oral TID BM  . folic acid  1 mg Oral Daily  . furosemide  40 mg Intravenous Daily  . LORazepam  1 mg Intravenous Once  . multivitamin with minerals  1 tablet Oral Daily  . mupirocin ointment   Nasal BID  . oxyCODONE-acetaminophen  1 tablet Oral Q6H  . potassium chloride  20 mEq Oral Daily  . potassium chloride  40 mEq Oral Once  . sodium chloride flush  3 mL Intravenous Q12H  . thiamine  100 mg Oral Daily   Or  . thiamine  100 mg Intravenous Daily  . vancomycin  125 mg Oral QID   Continuous Infusions: . sodium  chloride    . sodium chloride 75 mL/hr at 08/22/17 1221  .  ceFAZolin (ANCEF) IV Stopped (08/22/17 1103)  . lactated ringers 10 mL/hr at 08/21/17 1230  . sodium chloride irrigation     PRN Meds:.sodium chloride, albuterol, ondansetron (ZOFRAN) IV   SUBJECTIVE: His back pain is unchanged.  His diarrhea is a little bit better today.  He is having a little bit of abdominal cramping but no nausea or vomiting.  Review of Systems: Review of Systems  Constitutional: Positive for malaise/fatigue and weight loss. Negative for chills, diaphoresis and fever.  HENT: Negative for congestion and sore throat.   Respiratory: Negative for cough, sputum production and shortness of breath.   Cardiovascular: Negative for chest pain.  Gastrointestinal: Positive for diarrhea. Negative for abdominal pain, nausea and vomiting.  Genitourinary: Negative for dysuria.  Musculoskeletal: Positive for back pain. Negative for joint pain.  Skin: Negative for rash.  Neurological: Negative for dizziness and headaches.    No Known Allergies  OBJECTIVE: Vitals:   08/21/17 1007 08/21/17 1444 08/21/17 1937 08/22/17 0508  BP: 104/63 100/65 90/65 97/66   Pulse: 98 98 86 90  Resp: (!) 23 17 18 18   Temp: 99 F (37.2 C) 98.7 F (37.1 C) 98.5 F (36.9 C) 99.1 F (37.3 C)  TempSrc:  Axillary Oral Oral  SpO2: 96% 100%  98% 94%  Weight:    123 lb 10.9 oz (56.1 kg)  Height:       Body mass index is 17.25 kg/m.  Physical Exam  Constitutional: He is oriented to person, place, and time.  He is sleepy today.  HENT:  Now status post dental extractions.  Cardiovascular: Normal rate and regular rhythm.  Murmur heard. 2/6 systolic murmur.  Pulmonary/Chest: Effort normal. He has no wheezes. He has no rales.  Slight kyphotic deformity and mid thoracic spine.  Abdominal: Soft. There is no tenderness. There is no rebound.  Active bowel sounds.  Neurological: He is alert and oriented to person, place, and time.  Skin: No  rash noted.  Psychiatric: Mood and affect normal.    Lab Results Lab Results  Component Value Date   WBC 25.0 (H) 08/22/2017   HGB 8.9 (L) 08/22/2017   HCT 27.4 (L) 08/22/2017   MCV 88.7 08/22/2017   PLT 276 08/22/2017    Lab Results  Component Value Date   CREATININE 1.00 08/22/2017   BUN 20 08/22/2017   NA 129 (L) 08/22/2017   K 3.5 08/22/2017   CL 94 (L) 08/22/2017   CO2 25 08/22/2017    Lab Results  Component Value Date   ALT <5 (L) 08/16/2017   AST 20 08/16/2017   ALKPHOS 101 08/16/2017   BILITOT 0.4 08/16/2017     Microbiology: Recent Results (from the past 240 hour(s))  Culture, blood (routine x 2)     Status: None   Collection Time: 08/15/17 10:54 AM  Result Value Ref Range Status   Specimen Description BLOOD RIGHT HAND  Final   Special Requests   Final    BOTTLES DRAWN AEROBIC AND ANAEROBIC Blood Culture results may not be optimal due to an inadequate volume of blood received in culture bottles   Culture   Final    NO GROWTH 5 DAYS Performed at [facility], [address] [place], [place] [address]    Report Status 08/20/2017 FINAL  Final  Culture, blood (routine x 2)     Status: None   Collection Time: 08/15/17 10:54 AM  Result Value Ref Range Status   Specimen Description BLOOD LEFT HAND  Final   Special Requests   Final    BOTTLES DRAWN AEROBIC AND ANAEROBIC Blood Culture adequate volume   Culture   Final    NO GROWTH 5 DAYS Performed at [facility], [address] [place], [place] [address]    Report Status 08/20/2017 FINAL  Final  C difficile quick scan w PCR reflex     Status: Abnormal   Collection Time: 08/20/17  5:19 PM  Result Value Ref Range Status   C Diff antigen POSITIVE (A) NEGATIVE Final    Comment: CRITICAL RESULT CALLED TO, READ BACK BY AND VERIFIED WITH: M.NEWELL,RN AT 2112 BY L.PITT 08/20/17    C Diff toxin POSITIVE (A) NEGATIVE Final    Comment: CRITICAL RESULT CALLED TO, READ BACK BY AND VERIFIED  WITH: M.NEWELL,RN AT 2112 BY L.PITT 08/20/17    C Diff interpretation Toxin producing [name] detected.  Final    Cliffton AstersJohn Reianna Batdorf, MD [facility] [phone] pager   [phone] cell 08/22/2017, 1:10 PM

## 2017-08-22 NOTE — Progress Notes (Signed)
[facility] has offered a bed for patient on 30 day LOG so patient can complete IV antibiotics. Patient is uninsured and also has history of substance use. At this time, [facility] is the only accepting facility. CSW met with patient at bedside. Patient alert and oriented. CSW gave patient bed offer and advised that [facility] is the only available offer. CSW advised that patient cannot remain in hospital when a facility has offered a bed. Patient adamantly refusing [facility], stating "I almost died there."   CSW did consult with clinical supervisors and with MD. CSW updated MD that patient refusing only available SNF bed. MD to follow up. CSW to continue to follow for disposition planning.  Estanislado Emms, [facility]

## 2017-08-22 NOTE — Progress Notes (Signed)
Patient will need SNF rehab to obtain long term antibiotics and care. Not a candidate for inpt rehab admission. RN CM and SW aware. [phone]

## 2017-08-22 NOTE — Progress Notes (Addendum)
Pharmacy Antibiotic Note  Marcus [name] is a 52 y.o. male admitted on 08/09/2017 with pneumonia and endocarditis. Patient with h/o MSSA MV endocarditis and septic L knee arthitis in 06/2017 and T6-7 discitis.  s/p completed 6 weeks IV abx PTA.  Readmitted 3/9 with probable worsening of his mitral regurgitation, a persistent vegetation and persistent back pain.  Currently on IV Cefazolin 2g IV q8h for endocarditits, probable persistent MSSA infection, osteomyelitis.  Also receiving oral Vancomycin for superimposed C. Diff colitis Leukocytosis,  WBC 25.0 today,  Afebrile, Tm 99.1 SCr wnl stable, CrCl ~ 68 ml/min, UOP 325mg , 0.2 ml/kg/hr   Plan: Continue cefazolin 2g IV q8h ,  (end date 09/24/17) F/u further infectious work up, clinical status, renal function  I have entered the OPAT orders to prepare for discharge.  IV antibiotic discharge orders are pended. Please sign these orders by selecting > Discharge Orders > New Orders > Select/Click the button choice:  Manage This Unsigned Work.    Height: 5\' 11"  (180.3 cm) Weight: 123 lb 10.9 oz (56.1 kg) IBW/kg (Calculated) : 75.3  Temp (24hrs), Avg:98.8 F (37.1 C), Min:98.5 F (36.9 C), Max:99.1 F (37.3 C)  Recent Labs  Lab 08/19/17 0340 08/19/17 0952 08/20/17 0319 08/21/17 0407 08/22/17 0404  WBC 15.9* 14.3* 22.4* 26.2* 25.0*  CREATININE 1.23 1.10 1.06 1.02 1.00    Estimated Creatinine Clearance: 68.6 mL/min (by C-G formula based on SCr of 1 mg/dL).    No Known Allergies  Antimicrobials this admission: Vanc 3/9 >> 3/10 Cefepime 3/9 >> 3/11 Cefazolin 3/11 >> 3/13; 3/15 >> (end date 09/24/17)   Microbiology: 3/9: BCx x2: negative 3/10: Pleural fluid Cx: negative 3/10 BCx x2: negative  3/10 fungal (pleural):no fungus observe 3/15: BCx x2: negative 3/11 MRSA PCR: positive 3/9 Resp PCR: negative  3/9 UCx: mult speci/ recollect/final  Thank you for allowing pharmacy to be a part of this patient's care.  Noah Delaineuth Coe Angelos,  RPh Clinical Pharmacist Pager: [phone]-[phone]) [phone] or [phone]) Main Rx [phone] 08/22/2017 3:48 PM

## 2017-08-23 ENCOUNTER — Inpatient Hospital Stay (HOSPITAL_COMMUNITY): Payer: Medicaid Other

## 2017-08-23 LAB — CBC
HCT: 26.6 % — ABNORMAL LOW (ref 39.0–52.0)
HEMOGLOBIN: 8.3 g/dL — AB (ref 13.0–17.0)
MCH: 27.8 pg (ref 26.0–34.0)
MCHC: 31.2 g/dL (ref 30.0–36.0)
MCV: 89 fL (ref 78.0–100.0)
Platelets: 291 10*3/uL (ref 150–400)
RBC: 2.99 MIL/uL — ABNORMAL LOW (ref 4.22–5.81)
RDW: 14.7 % (ref 11.5–15.5)
WBC: 17.4 10*3/uL — AB (ref 4.0–10.5)

## 2017-08-23 LAB — BASIC METABOLIC PANEL
ANION GAP: 10 (ref 5–15)
BUN: 18 mg/dL (ref 6–20)
CALCIUM: 7.7 mg/dL — AB (ref 8.9–10.3)
CHLORIDE: 93 mmol/L — AB (ref 101–111)
CO2: 25 mmol/L (ref 22–32)
CREATININE: 0.88 mg/dL (ref 0.61–1.24)
GFR calc non Af Amer: >60 mL/min (ref >60)
Glucose, Bld: 107 mg/dL — ABNORMAL HIGH (ref 65–99)
Potassium: 3.5 mmol/L (ref 3.5–5.1)
SODIUM: 128 mmol/L — AB (ref 135–145)

## 2017-08-23 MED ORDER — OXYCODONE HCL 5 MG PO TABS
5.0000 mg | ORAL_TABLET | Freq: Four times a day (QID) | ORAL | Status: DC
  Administered 2017-08-23 – 2017-08-25 (×9): 5 mg via ORAL
  Filled 2017-08-23 (×9): qty 1

## 2017-08-23 MED ORDER — ACETAMINOPHEN 500 MG PO TABS
1000.0000 mg | ORAL_TABLET | Freq: Four times a day (QID) | ORAL | Status: DC | PRN
  Filled 2017-08-23: qty 2

## 2017-08-23 MED ORDER — FOLIC ACID 1 MG PO TABS: 1.0000 mg | ORAL_TABLET | Freq: Every day | ORAL | Status: DC

## 2017-08-23 NOTE — Progress Notes (Signed)
Family Medicine Teaching Service Daily Progress Note Intern Pager: [phone]  Patient name: Marcus [name] Medical record number: [id number] Date of birth: 08/29/1965 Age: 52 y.o. Gender: male  Primary Care Provider: Patient, No Pcp Per Consultants: CCM, ID, Cardiology, CVTS, Dental  Code Status: Full   Pt Overview and Major Events to Date:  3/09: Admit forsepsis thought to be secondary to HAP and new onsetHF,cultures obtained, broad-spectrum antibiotics initiated 3/10: PCCM consulted,tolerating BiPAP 3/10: thoracentesis 3/14: TEE with severe MR, CVTS consulted  3/21: full dental extraction  Assessment and Plan: TREMELL REIMERS a 52 y.o.malepresenting with worsening SOB. 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  Endocarditis of mitral valve h/oembolic strokeand metabolic encephalopathy new onset heart failure Patient does have signs concerning for new onset heart failure 2/2 vavular dysfunction.Leukocytosis improved today at 17.4 from 25. -cardiology consulted, appreciate recommendations:lasix 40mg  daily -daily weight -strict Is and Os -CVTS consulted, appreciate recommendations: not candidate for surgery until colitis is completely resolved   -continue ancef (3/15-) -ID following: keep on ancef pending CVTS sx (end date 09/24/17) -Dentistry consulted,appreciate recommendations: s/p full extraction 3/21  Osteomyelitis MRI on spine on 08/19/17 showing T7-T8 discitis-osteomyelitis without abscess and C6-7 suspicious for discitis-osteomyelitis without abscess. Mild spinal stenosis of T7-8 also seen.  -ID following, appreciate recommendations: continue abx as described above, no MRI needed   Anemia Chronic, stable.Current Hgb8.3 from 8.9 yesterday. Appears to be chronically low with baseline of 8-9. Normocytic  -continue to monitor with daily CBC  Left knee edema h/o left MSSA  septic arthritis Presented with lower extremity edema. Does have recent septic arthritis of the knee.DVT r/o with LE dopplers. -continue to monitor   Hepatitis C genotype 2B Noted during previous hospitalization. No prior treatment. Recent viral load 224K.LFTsstable.  -ID consulted, appreciate recommendations: recommended outpatient tx   Compression deformities of mid to lower thoracic spine Patientreports history of fall back in January 2019he continues to have persistent low back pain since discharge. Patient reports use of OxyContin and oxycodone for pain control though no medications per chart review. PT recommending SNF.  -continuepercocet 5-325 q6H scheduled, wean as tolerated -MRI spineunder general anesthesia  Polysubstance abuse UDS positive for barbiturates and opiates on presentation. Also has history of cocaine back in January 2019. Has known endocarditis likely from IV drug use though patient reports no recentillicit drug use. CIWA not measured over night -- order fell off? Replaced order.  -Continue onCIWA  -Daily folate and thiamine -monitor COWS score- 6, 6 overnight -percocet 5 Q6, can plan to wean as tolerated  Hyponatremia Current Na stable/low at 128 from 129 yesterday.Likely 2/2 poor oral intake as patient has been NPO on and off for several days and likely had poor oral intake prior to admission. -monitor on daily BMP -encourage POintake -NS @ 75cc/hr  Hypokalemia-resolved K of3.5 -replete with KDur as needed -monitor on daily BMP  Protein calorie malnutrition  Albumin of 2.3 on 3/16. Likely 2/2 poor oral intake -consult nutrition, appreciate recommendations  C-diff -enteric precautions -PO vancomycin for same time as systemic therapy for MSSA and probably at least 1 week beyond per ID recommendations   Disposition Recommended by therapy for inpatient rehab. Rehab admission coordinator recommending likely SNF due to need for  longer term care and PICC line management.  -CSW consulted appreciate recommendations: state patient will have difficult placement due to insurance and history of leaving SNF AMA   FEN/GI:soft diet, NS @75cc /hr  RUE:AVWU,JWJX discontinuedlovenoxfor dental procedure.  Disposition: will likely be dc to SNF when medically stable   Subjective:  Patient appears well this AM, does endorse continued large amounts of diarrhea overnight and endorses mild generalized abdominal pain with diarrhea. States he feels about the same as yesterday. Denying dyspnea or chest pain. States he does not want to go back to the facility offered because he has been there before and had a negative experience.  Objective: Temp:  [98.8 F (37.1 C)] 98.8 F (37.1 C) (03/23 0500) Pulse Rate:  [95-110] 99 (03/23 1146) Resp:  [16-27] 27 (03/23 1146) BP: (100-108)/(56-62) 100/62 (03/23 0500) SpO2:  [94 %-97 %] 94 % (03/23 1146) Weight:  [129 lb 3.2 oz (58.6 kg)] 129 lb 3.2 oz (58.6 kg) (03/23 0500) Physical Exam: General: NAD, rests in bed Cardiovascular: RRR, grade 3 systolic murmur  Respiratory: CTAB, no W/R/R Abdomen: soft, nt, nd Extremities: non tender, no edema   Laboratory: Recent Labs  Lab 08/21/17 0407 08/22/17 0404 08/23/17 0635  WBC 26.2* 25.0* 17.4*  HGB 9.9* 8.9* 8.3*  HCT 30.6* 27.4* 26.6*  PLT 286 276 291   Recent Labs  Lab 08/21/17 0407 08/22/17 0404 08/23/17 0635  NA 128* 129* 128*  K 3.2* 3.5 3.5  CL 93* 94* 93*  CO2 23 25 25   BUN 16 20 18   CREATININE 1.02 1.00 0.88  CALCIUM 8.2* 8.1* 7.7*  GLUCOSE 114* 109* 107*    Imaging/Diagnostic Tests: Dg Orthopantogram  Result Date: 08/15/2017 CLINICAL DATA:  Poor dentition.  Cardiac workup. EXAM: ORTHOPANTOGRAM/PANORAMIC COMPARISON:  None. FINDINGS: There are numerous missing maxillary and mandibular teeth, and multiple remaining maxillary teeth appear broken. There is mild periapical lucency associated with a left maxillary  premolar tooth. IMPRESSION: Poor dentition with multiple missing and broken teeth. Mild periapical lucency associated with a left maxillary premolar tooth, abscess not excluded. Electronically Signed   By: Sebastian Ache M.D.   On: 08/15/2017 09:12   Dg Chest 2 View  Result Date: 08/14/2017 CLINICAL DATA:  Followup pneumonia. EXAM: CHEST - 2 VIEW COMPARISON:  08/10/2016 and older studies. FINDINGS: Moderate right and small left pleural effusions. There is associated lung base opacity, greater on the right, which is consistent with atelectasis, pneumonia or a combination. The patchy airspace opacity noted along the perihilar left lung on the prior study has improved. Airspace opacity in the right mid to lower lung appears somewhat less confluent but is unchanged in extent. No new lung abnormalities.  No pneumothorax. IMPRESSION: 1. Mild improvement in lung consolidation compared to the prior exam. 2. Persistent moderate right and small left pleural effusions. No new abnormalities. Electronically Signed   By: Amie Portland M.D.   On: 08/14/2017 13:16   Dg Chest 2 View  Addendum Date: 08/09/2017   ADDENDUM REPORT: 08/09/2017 15:41 ADDENDUM: After further review, there are compression deformities of 2 adjacent vertebral bodies in the mid to lower thoracic spine, of uncertain age but new compared to an older chest x-ray 02/15/2005. Would consider further characterization with CT. These results were called by telephone at the time of interpretation on 08/09/2017 at 3:40 pm to Dr. Doug Sou , who verbally acknowledged these results. Electronically Signed   By: Bary Richard M.D.   On: 08/09/2017 15:41   Result Date: 08/09/2017 CLINICAL DATA:  Shortness of breath for 2 days. Bacterial meningitis diagnosed at the beginning of January, than recently diagnosed with pneumonia. History of TIA in January with left-sided weakness. Former smoker. EXAM: CHEST -  2 VIEW COMPARISON:  Chest x-ray dated 06/08/2017. FINDINGS: New  dense opacity at the right lung base, likely a combination of consolidation and pleural effusion. Pleural effusion component is at least moderate in size. Patchy opacities are seen within the left perihilar and lower lung zones, favor edema. Additional opacity at the left lung base is likely a combination of atelectasis and small pleural effusion. Heart size and mediastinal contours are stable. Osseous structures about the chest are unremarkable. IMPRESSION: 1. New dense opacity at the right lung base, likely a combination of pneumonia or atelectasis and pleural effusion. The pleural effusion component is at least moderate in size. Would consider chest CT for further characterization. 2. Additional patchy ill-defined opacities within the left mid and lower lung, most likely edema, pneumonia considered less likely. 3. Probable mild atelectasis and/or small pleural effusion at the left lung base. Electronically Signed: By: Bary Richard M.D. On: 08/09/2017 15:29   Ct Angio Chest Pe W And/or Wo Contrast  Result Date: 08/09/2017 CLINICAL DATA:  SOB x2 days. Pt diagnosed with bacterial meningitis at the beginning of January, then recently diagnosed with pneumonia. Pt also has hx of TIA beginning of January that left him with left sided weakness. Patient had left knee surgery x 6 weeks ago for sepsis. EXAM: CT ANGIOGRAPHY CHEST WITH CONTRAST TECHNIQUE: Multidetector CT imaging of the chest was performed using the standard protocol during bolus administration of intravenous contrast. Multiplanar CT image reconstructions and MIPs were obtained to evaluate the vascular anatomy. CONTRAST:  ISOVUE-370 IOPAMIDOL (ISOVUE-370) INJECTION 76% COMPARISON:  Current chest radiographs and prior studies. FINDINGS: Cardiovascular: Satisfactory opacification of the pulmonary arteries to the segmental level. No evidence of pulmonary embolism. Heart is normal in size and configuration. No pericardial effusion. No coronary artery  calcifications. The great vessels normal in caliber. No aortic atherosclerosis. No dissection. Mediastinum/Nodes: No neck base or axillary masses or pathologically enlarged lymph nodes. No mediastinal or hilar masses or discrete enlarged lymph nodes. The trachea is patent and normal in caliber. Esophagus is unremarkable. Lungs/Pleura: Large right and small left pleural effusions. Complete atelectasis of the right middle lobe and near complete atelectasis of the right lower lobe. Small area of consolidation in the anterior inferior right upper lobe. Patchy consolidation is noted in a peribronchovascular distribution in the left upper lobe and, to lesser degree, left lower lobe. There is dependent opacity also in the left lower lobe consistent with atelectasis. No pneumothorax. Upper Abdomen: No acute abnormality. Musculoskeletal: There is marked narrowing of the T6-T7 disc space with resorption/irregularity of the lower endplate of T6 in upper endplate of T7, with mild loss of height of these vertebra. This leads to a mild focal kyphosis. This is new since a chest radiograph dated 02/15/2005. A subtle lucency across the T6 spinous process posteriorly suggests a previous fracture. All remaining vertebral bodies are normal in height. No osteoblastic or osteolytic lesions. Review of the MIP images confirms the above findings. IMPRESSION: 1. Patchy areas of consolidation in both lungs consistent with multifocal pneumonia. 2. Complete atelectasis of the right middle lobe and near complete atelectasis of the right lower lobe. Mild dependent atelectasis in the left lower lobe. 3. Large right and small left pleural effusions. 4. Marked loss of the disc space at T6-C7 with irregular resorption of endplates leading to loss of vertebral body height of T6 and T7 and a focal kyphosis. This may reflect the sequelae of an old fracture. It could be due to previous discitis/osteomyelitis. Electronically  Signed   By: Amie Portland  M.D.   On: 08/09/2017 18:44   Mr Brain Wo Contrast  Result Date: 08/14/2017 CLINICAL DATA:  52 y/o  M; stroke follow-up. EXAM: MRI HEAD WITHOUT CONTRAST TECHNIQUE: Axial DWI sequence was acquired. Patient combative, unable to continue examination. COMPARISON:  06/09/2017 MRI head.  06/08/2017 CT head. FINDINGS: Low B value DWI sequence demonstrates areas of T2 hyperintensity in the right greater than left frontal, parietal, and occipital lobes as well as splenium of corpus callosum and cerebellum corresponding to infarctions on prior MRI. The high B value sequence is motion degraded, insufficient to assess for reduced diffusion and acute stroke. IMPRESSION: Severe motion degraded DWI sequence insufficient to assess for acute stroke. Repeat imaging is recommended when patient is able to hold still and follow commands. Electronically Signed   By: Mitzi Hansen M.D.   On: 08/14/2017 21:38   Mr Laqueta Jean NW Contrast  Result Date: 08/19/2017 CLINICAL DATA:  Hospitalized in January with bacteremia, mitral valve endocarditis, CNS septic emboli, and thoracic spine discitis. Persistent back pain. EXAM: MRI HEAD WITHOUT AND WITH CONTRAST MRI THORACIC SPINE WITHOUT AND WITH CONTRAST MRI LUMBAR SPINE WITHOUT AND WITH CONTRAST TECHNIQUE: Multiplanar, multiecho pulse sequences of the brain and surrounding structures, and cervical spine, to include the craniocervical junction and cervicothoracic junction, were obtained without and with intravenous contrast. Multiplanar and multiecho pulse sequences of the thoracic and lumbar spine were obtained without and with intravenous contrast. CONTRAST:  9mL MULTIHANCE GADOBENATE DIMEGLUMINE 529 MG/ML IV SOLN COMPARISON:  Brain MRI 06/09/2017. CTA chest, abdomen, and pelvis 08/14/2017. Lumbar spine CT 12/30/2015. FINDINGS: MRI HEAD FINDINGS Brain: Embolic infarcts throughout the brain on the prior MRI demonstrate interval evolution. Associated diffusion abnormality has  decreased though not completely resolved in some of the areas of infarction, and there is developing encephalomalacia. Associated chronic blood products are present with the majority of the infarcts. Multiple infarcts demonstrate enhancement. No definite restricted diffusion is seen to clearly indicate acute infarction. There are a few subcentimeter infarcts in the left centrum semiovale which are new from the prior MRI and demonstrate mild trace diffusion abnormality without reduced ADC, consistent with interval subacute infarcts. No intracranial mass, midline shift, or extra-axial fluid collection is seen. There is mild cerebral atrophy. No abnormal meningeal enhancement is identified. Vascular: Major intracranial vascular flow voids are preserved. Skull and upper cervical spine: Unremarkable bone marrow signal. Sinuses/Orbits: Unremarkable orbits. Clear paranasal sinuses. Trace left mastoid fluid. Other: None. MRI THORACIC SPINE FINDINGS Spinal numbering is performed by counting down from the craniocervical junction. This results in different numbering than what was reported on the prior CTA due to transitional lumbosacral anatomy. Some sequences are up to moderately motion degraded. Alignment: Slight focal kyphosis at T7-8.  No listhesis. Vertebrae: Disc and endplate destruction at T7-8 as seen on prior CT with diffuse enhancement throughout the residual disc space and marrow of both T7 and T8 vertebral bodies extending into the pedicles. Minimal dorsal epidural enhancement at T7-8 without evidence of epidural abscess. Partially visualized marrow edema and enhancement in the C6 and C7 vertebral bodies with endplate irregularity, disc space narrowing, and enhancement anteriorly in the disc space. No evidence of C6-7 epidural abscess on limited sagittal imaging. Very mild endplate edema and enhancement at T11-12 favored to be degenerative given lack of disc abnormality or endplate erosion to suggest infection.  Background diminished T1 bone marrow signal intensity diffusely is likely related to patient's known anemia. 1.4 cm enhancing lesion  in the posterior elements on the right at T3 is consistent with an hemangioma. A smaller atypical hemangioma is also suspected in the right T2 pedicle. Cord:  Normal signal. Paraspinal and other soft tissues: Paravertebral soft tissue edema/phlegmon from T6-T8. Interspinous soft tissue edema and enhancement at T7-8. No paraspinal abscess identified. Partially visualized prevertebral inflammation in the lower cervical spine. Moderate right and small left pleural effusions with associated compressive atelectasis on the right. Small volume fluid in the esophagus. Partially visualized endotracheal tube and tracheal secretions. Disc levels: Moderate disc space narrowing, disc bulging, and endplate spurring at C7-T1 result in moderate right and mild-to-moderate left neural foraminal stenosis and at most mild spinal stenosis. Broad-based posterior disc osteophyte complex and facet hypertrophy at T7-8 result in mild spinal stenosis without cord compression. Mild disc bulging and scattered tiny disc protrusions elsewhere in the thoracic spine do not result in spinal stenosis. MRI LUMBAR SPINE FINDINGS Some sequences are mildly motion degraded. Segmentation: Transitional lumbosacral anatomy with a lumbarized S1. Alignment: Facet mediated anterolisthesis of L5 on S1 measuring 4 mm. Vertebrae: Diffusely diminished T1 bone marrow signal intensity likely related to anemia. Predominantly type 2 degenerative endplate changes at S1-2. Mild type 1 changes at L3-4. No evidence of infectious discitis-osteomyelitis. No fracture. Conus medullaris: Extends to the L2 level and appears normal. Paraspinal and other soft tissues: Mild nonspecific posterior paraspinal muscle edema bilaterally in the mid and lower lumbar spine. No fluid collection. Disc levels: L1-2: Negative. L2-3: Minimal disc bulging without  stenosis. L3-4: Mild disc space narrowing. Mild disc bulging and mild facet and ligamentum flavum hypertrophy result in mild bilateral lateral recess stenosis and mild left neural foraminal stenosis without significant spinal stenosis. L4-5: Mild disc space narrowing. Mild disc bulging asymmetric to the right and moderate right and mild left facet hypertrophy result in mild right lateral recess stenosis and mild right neural foraminal stenosis without significant spinal stenosis. L5-S1: Anterolisthesis with disc uncovering and severe facet hypertrophy result in mild spinal stenosis, mild-to-moderate bilateral lateral recess stenosis, and mild-to-moderate bilateral neural foraminal stenosis. S1-2: Transitional level with disc space narrowing. Mild disc bulging and moderate facet arthrosis result in mild left neural foraminal stenosis without spinal stenosis. IMPRESSION: 1. 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 2. Interval tiny subacute infarcts in the left centrum semiovale. No acute infarct. 3. Transitional lumbosacral anatomy as above. 4. 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 No epidural or paraspinal abscess. 5. Partially visualized disc and endplate enhancement at C6-7 suspicious for discitis-osteomyelitis with prevertebral inflammation. No evidence of epidural abscess though this was incompletely imaged. 6. Mild spinal stenosis at T7-8. 7. No evidence of discitis-osteomyelitis in the lumbar spine. 8. Nonspecific bilateral lumbar posterior paraspinal muscle edema without abscess. 9. Lumbar disc and facet degeneration most notable at L5-S1 where there is grade 1 anterolisthesis, mild spinal stenosis, and mild-to-moderate lateral recess and neural foraminal stenosis. 10. Moderate right and small left pleural effusions. Electronically Signed   By: Sebastian Ache M.D.   On: 08/19/2017 15:55   Mr Thoracic Spine W Wo Contrast  Result Date:  08/19/2017 CLINICAL DATA:  Hospitalized in January with bacteremia, mitral valve endocarditis, CNS septic emboli, and thoracic spine discitis. Persistent back pain. EXAM: MRI HEAD WITHOUT AND WITH CONTRAST MRI THORACIC SPINE WITHOUT AND WITH CONTRAST MRI LUMBAR SPINE WITHOUT AND WITH CONTRAST TECHNIQUE: Multiplanar, multiecho pulse sequences of the brain and surrounding structures, and cervical spine, to include the craniocervical junction and cervicothoracic junction, were obtained without  and with intravenous contrast. Multiplanar and multiecho pulse sequences of the thoracic and lumbar spine were obtained without and with intravenous contrast. CONTRAST:  9mL MULTIHANCE GADOBENATE DIMEGLUMINE 529 MG/ML IV SOLN COMPARISON:  Brain MRI 06/09/2017. CTA chest, abdomen, and pelvis 08/14/2017. Lumbar spine CT 12/30/2015. FINDINGS: MRI HEAD FINDINGS Brain: Embolic infarcts throughout the brain on the prior MRI demonstrate interval evolution. Associated diffusion abnormality has decreased though not completely resolved in some of the areas of infarction, and there is developing encephalomalacia. Associated chronic blood products are present with the majority of the infarcts. Multiple infarcts demonstrate enhancement. No definite restricted diffusion is seen to clearly indicate acute infarction. There are a few subcentimeter infarcts in the left centrum semiovale which are new from the prior MRI and demonstrate mild trace diffusion abnormality without reduced ADC, consistent with interval subacute infarcts. No intracranial mass, midline shift, or extra-axial fluid collection is seen. There is mild cerebral atrophy. No abnormal meningeal enhancement is identified. Vascular: Major intracranial vascular flow voids are preserved. Skull and upper cervical spine: Unremarkable bone marrow signal. Sinuses/Orbits: Unremarkable orbits. Clear paranasal sinuses. Trace left mastoid fluid. Other: None. MRI THORACIC SPINE FINDINGS Spinal  numbering is performed by counting down from the craniocervical junction. This results in different numbering than what was reported on the prior CTA due to transitional lumbosacral anatomy. Some sequences are up to moderately motion degraded. Alignment: Slight focal kyphosis at T7-8.  No listhesis. Vertebrae: Disc and endplate destruction at T7-8 as seen on prior CT with diffuse enhancement throughout the residual disc space and marrow of both T7 and T8 vertebral bodies extending into the pedicles. Minimal dorsal epidural enhancement at T7-8 without evidence of epidural abscess. Partially visualized marrow edema and enhancement in the C6 and C7 vertebral bodies with endplate irregularity, disc space narrowing, and enhancement anteriorly in the disc space. No evidence of C6-7 epidural abscess on limited sagittal imaging. Very mild endplate edema and enhancement at T11-12 favored to be degenerative given lack of disc abnormality or endplate erosion to suggest infection. Background diminished T1 bone marrow signal intensity diffusely is likely related to patient's known anemia. 1.4 cm enhancing lesion in the posterior elements on the right at T3 is consistent with an hemangioma. A smaller atypical hemangioma is also suspected in the right T2 pedicle. Cord:  Normal signal. Paraspinal and other soft tissues: Paravertebral soft tissue edema/phlegmon from T6-T8. Interspinous soft tissue edema and enhancement at T7-8. No paraspinal abscess identified. Partially visualized prevertebral inflammation in the lower cervical spine. Moderate right and small left pleural effusions with associated compressive atelectasis on the right. Small volume fluid in the esophagus. Partially visualized endotracheal tube and tracheal secretions. Disc levels: Moderate disc space narrowing, disc bulging, and endplate spurring at C7-T1 result in moderate right and mild-to-moderate left neural foraminal stenosis and at most mild spinal stenosis.  Broad-based posterior disc osteophyte complex and facet hypertrophy at T7-8 result in mild spinal stenosis without cord compression. Mild disc bulging and scattered tiny disc protrusions elsewhere in the thoracic spine do not result in spinal stenosis. MRI LUMBAR SPINE FINDINGS Some sequences are mildly motion degraded. Segmentation: Transitional lumbosacral anatomy with a lumbarized S1. Alignment: Facet mediated anterolisthesis of L5 on S1 measuring 4 mm. Vertebrae: Diffusely diminished T1 bone marrow signal intensity likely related to anemia. Predominantly type 2 degenerative endplate changes at S1-2. Mild type 1 changes at L3-4. No evidence of infectious discitis-osteomyelitis. No fracture. Conus medullaris: Extends to the L2 level and appears normal. Paraspinal and other soft  tissues: Mild nonspecific posterior paraspinal muscle edema bilaterally in the mid and lower lumbar spine. No fluid collection. Disc levels: L1-2: Negative. L2-3: Minimal disc bulging without stenosis. L3-4: Mild disc space narrowing. Mild disc bulging and mild facet and ligamentum flavum hypertrophy result in mild bilateral lateral recess stenosis and mild left neural foraminal stenosis without significant spinal stenosis. L4-5: Mild disc space narrowing. Mild disc bulging asymmetric to the right and moderate right and mild left facet hypertrophy result in mild right lateral recess stenosis and mild right neural foraminal stenosis without significant spinal stenosis. L5-S1: Anterolisthesis with disc uncovering and severe facet hypertrophy result in mild spinal stenosis, mild-to-moderate bilateral lateral recess stenosis, and mild-to-moderate bilateral neural foraminal stenosis. S1-2: Transitional level with disc space narrowing. Mild disc bulging and moderate facet arthrosis result in mild left neural foraminal stenosis without spinal stenosis. IMPRESSION: 1. 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 2. Interval  tiny subacute infarcts in the left centrum semiovale. No acute infarct. 3. Transitional lumbosacral anatomy as above. 4. 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 No epidural or paraspinal abscess. 5. Partially visualized disc and endplate enhancement at C6-7 suspicious for discitis-osteomyelitis with prevertebral inflammation. No evidence of epidural abscess though this was incompletely imaged. 6. Mild spinal stenosis at T7-8. 7. No evidence of discitis-osteomyelitis in the lumbar spine. 8. Nonspecific bilateral lumbar posterior paraspinal muscle edema without abscess. 9. Lumbar disc and facet degeneration most notable at L5-S1 where there is grade 1 anterolisthesis, mild spinal stenosis, and mild-to-moderate lateral recess and neural foraminal stenosis. 10. Moderate right and small left pleural effusions. Electronically Signed   By: Sebastian Ache M.D.   On: 08/19/2017 15:55   Mr Lumbar Spine W Wo Contrast  Result Date: 08/19/2017 CLINICAL DATA:  Hospitalized in January with bacteremia, mitral valve endocarditis, CNS septic emboli, and thoracic spine discitis. Persistent back pain. EXAM: MRI HEAD WITHOUT AND WITH CONTRAST MRI THORACIC SPINE WITHOUT AND WITH CONTRAST MRI LUMBAR SPINE WITHOUT AND WITH CONTRAST TECHNIQUE: Multiplanar, multiecho pulse sequences of the brain and surrounding structures, and cervical spine, to include the craniocervical junction and cervicothoracic junction, were obtained without and with intravenous contrast. Multiplanar and multiecho pulse sequences of the thoracic and lumbar spine were obtained without and with intravenous contrast. CONTRAST:  9mL MULTIHANCE GADOBENATE DIMEGLUMINE 529 MG/ML IV SOLN COMPARISON:  Brain MRI 06/09/2017. CTA chest, abdomen, and pelvis 08/14/2017. Lumbar spine CT 12/30/2015. FINDINGS: MRI HEAD FINDINGS Brain: Embolic infarcts throughout the brain on the prior MRI demonstrate interval evolution. Associated diffusion  abnormality has decreased though not completely resolved in some of the areas of infarction, and there is developing encephalomalacia. Associated chronic blood products are present with the majority of the infarcts. Multiple infarcts demonstrate enhancement. No definite restricted diffusion is seen to clearly indicate acute infarction. There are a few subcentimeter infarcts in the left centrum semiovale which are new from the prior MRI and demonstrate mild trace diffusion abnormality without reduced ADC, consistent with interval subacute infarcts. No intracranial mass, midline shift, or extra-axial fluid collection is seen. There is mild cerebral atrophy. No abnormal meningeal enhancement is identified. Vascular: Major intracranial vascular flow voids are preserved. Skull and upper cervical spine: Unremarkable bone marrow signal. Sinuses/Orbits: Unremarkable orbits. Clear paranasal sinuses. Trace left mastoid fluid. Other: None. MRI THORACIC SPINE FINDINGS Spinal numbering is performed by counting down from the craniocervical junction. This results in different numbering than what was reported on the prior CTA due to transitional lumbosacral anatomy. Some sequences are  up to moderately motion degraded. Alignment: Slight focal kyphosis at T7-8.  No listhesis. Vertebrae: Disc and endplate destruction at T7-8 as seen on prior CT with diffuse enhancement throughout the residual disc space and marrow of both T7 and T8 vertebral bodies extending into the pedicles. Minimal dorsal epidural enhancement at T7-8 without evidence of epidural abscess. Partially visualized marrow edema and enhancement in the C6 and C7 vertebral bodies with endplate irregularity, disc space narrowing, and enhancement anteriorly in the disc space. No evidence of C6-7 epidural abscess on limited sagittal imaging. Very mild endplate edema and enhancement at T11-12 favored to be degenerative given lack of disc abnormality or endplate erosion to  suggest infection. Background diminished T1 bone marrow signal intensity diffusely is likely related to patient's known anemia. 1.4 cm enhancing lesion in the posterior elements on the right at T3 is consistent with an hemangioma. A smaller atypical hemangioma is also suspected in the right T2 pedicle. Cord:  Normal signal. Paraspinal and other soft tissues: Paravertebral soft tissue edema/phlegmon from T6-T8. Interspinous soft tissue edema and enhancement at T7-8. No paraspinal abscess identified. Partially visualized prevertebral inflammation in the lower cervical spine. Moderate right and small left pleural effusions with associated compressive atelectasis on the right. Small volume fluid in the esophagus. Partially visualized endotracheal tube and tracheal secretions. Disc levels: Moderate disc space narrowing, disc bulging, and endplate spurring at C7-T1 result in moderate right and mild-to-moderate left neural foraminal stenosis and at most mild spinal stenosis. Broad-based posterior disc osteophyte complex and facet hypertrophy at T7-8 result in mild spinal stenosis without cord compression. Mild disc bulging and scattered tiny disc protrusions elsewhere in the thoracic spine do not result in spinal stenosis. MRI LUMBAR SPINE FINDINGS Some sequences are mildly motion degraded. Segmentation: Transitional lumbosacral anatomy with a lumbarized S1. Alignment: Facet mediated anterolisthesis of L5 on S1 measuring 4 mm. Vertebrae: Diffusely diminished T1 bone marrow signal intensity likely related to anemia. Predominantly type 2 degenerative endplate changes at S1-2. Mild type 1 changes at L3-4. No evidence of infectious discitis-osteomyelitis. No fracture. Conus medullaris: Extends to the L2 level and appears normal. Paraspinal and other soft tissues: Mild nonspecific posterior paraspinal muscle edema bilaterally in the mid and lower lumbar spine. No fluid collection. Disc levels: L1-2: Negative. L2-3: Minimal disc  bulging without stenosis. L3-4: Mild disc space narrowing. Mild disc bulging and mild facet and ligamentum flavum hypertrophy result in mild bilateral lateral recess stenosis and mild left neural foraminal stenosis without significant spinal stenosis. L4-5: Mild disc space narrowing. Mild disc bulging asymmetric to the right and moderate right and mild left facet hypertrophy result in mild right lateral recess stenosis and mild right neural foraminal stenosis without significant spinal stenosis. L5-S1: Anterolisthesis with disc uncovering and severe facet hypertrophy result in mild spinal stenosis, mild-to-moderate bilateral lateral recess stenosis, and mild-to-moderate bilateral neural foraminal stenosis. S1-2: Transitional level with disc space narrowing. Mild disc bulging and moderate facet arthrosis result in mild left neural foraminal stenosis without spinal stenosis. IMPRESSION: 1. 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 2. Interval tiny subacute infarcts in the left centrum semiovale. No acute infarct. 3. Transitional lumbosacral anatomy as above. 4. 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 No epidural or paraspinal abscess. 5. Partially visualized disc and endplate enhancement at C6-7 suspicious for discitis-osteomyelitis with prevertebral inflammation. No evidence of epidural abscess though this was incompletely imaged. 6. Mild spinal stenosis at T7-8. 7. No evidence of discitis-osteomyelitis in the lumbar spine. 8. Nonspecific  bilateral lumbar posterior paraspinal muscle edema without abscess. 9. Lumbar disc and facet degeneration most notable at L5-S1 where there is grade 1 anterolisthesis, mild spinal stenosis, and mild-to-moderate lateral recess and neural foraminal stenosis. 10. Moderate right and small left pleural effusions. Electronically Signed   By: Sebastian AcheAllen  Grady M.D.   On: 08/19/2017 15:55   Dg Chest Port 1 View  Result Date:  08/10/2017 CLINICAL DATA:  Post right thora, 1.4 L removed EXAM: PORTABLE CHEST 1 VIEW COMPARISON:  Chest x-ray dated 08/09/2017. FINDINGS: Some improvement in aeration at the left lung base. Residual opacity is presumably atelectasis or pneumonia. Ill-defined opacities persist throughout the left lung, consistent with the multifocal pneumonia better demonstrated on chest CT of 08/09/2017. Additional persistent atelectasis at the left lung base. No pneumothorax. Heart size and mediastinal contours appear stable. IMPRESSION: 1. Improved aeration at the right lung base status post thoracentesis. Residual opacity is presumably atelectasis or pneumonia. No pneumothorax seen. 2. Stable patchy opacities throughout the left lung, compatible with the multifocal pneumonia demonstrated on yesterday's chest CT. Electronically Signed   By: Bary RichardStan  Maynard M.D.   On: 08/10/2017 14:54   Ct Angio Chest/abd/pel For Dissection W And/or W/wo  Result Date: 08/14/2017 CLINICAL DATA:  Septic arterial embolism EXAM: CT ANGIOGRAPHY CHEST, ABDOMEN AND PELVIS TECHNIQUE: Multidetector CT imaging through the chest, abdomen and pelvis was performed using the standard protocol during bolus administration of intravenous contrast. Multiplanar reconstructed images and MIPs were obtained and reviewed to evaluate the vascular anatomy. CONTRAST:  100 cc ISOVUE-370 IOPAMIDOL (ISOVUE-370) INJECTION 76% COMPARISON:  Chest CT 08/09/2017 FINDINGS: CTA CHEST FINDINGS Cardiovascular: No acute pulmonary embolus. Satisfactory opacification of the aorta and pulmonary arteries. Normal size heart without pericardial effusion. Normal branch pattern of the great vessels off the arch. No coronary arteriosclerosis. No aortic atherosclerosis. No aneurysm. No aortic dissection. Mediastinum/Nodes: No supraclavicular, axillary, mediastinal nor hilar lymphadenopathy. Trachea is patent and normal caliber without intraluminal abnormality. Esophagus is unremarkable. No  thyromegaly is noted. Lungs/Pleura: Bilateral moderate-sized pleural effusions slightly smaller on the right with loculated component. Adjacent compressive atelectasis is noted. Patchy airspace opacities in the left upper lobe are redemonstrated, with slight improvement in aeration since prior though the vast majority of the pulmonary opacity still persists. Musculoskeletal: Redemonstration of marked disc space narrowing C6-7 with endplate irregularity of the inferior endplate of T6 superior endplate of T7 with resultant mild focal kyphosis. Review of the MIP images confirms the above findings. CTA ABDOMEN AND PELVIS FINDINGS VASCULAR Aorta: Normal caliber aorta without aneurysm, dissection, vasculitis or significant stenosis. Celiac: Patent without evidence of aneurysm, dissection, vasculitis or significant stenosis. SMA: Patent without evidence of aneurysm, dissection, vasculitis or significant stenosis. Renals: Single renal arteries bilaterally without acute abnormality. No significant stenosis, dissection or evidence of fibromuscular dysplasia. IMA: Patent Inflow: No aneurysm or dissection. Minimal atherosclerotic plaque in the proximal right common iliac and both distal iliac arteries. No significant stenosis. No dissection or aneurysm. Veins: No obvious venous abnormality within the limitations of this arterial phase study. Review of the MIP images confirms the above findings. NON-VASCULAR Hepatobiliary: No focal liver abnormality is seen. No gallstones, gallbladder wall thickening, or biliary dilatation. Pancreas: Unremarkable. No pancreatic ductal dilatation or surrounding inflammatory changes. Spleen: Heterogeneous enhancement likely due to timing of contrast bolus. No splenomegaly. Adrenals/Urinary Tract: No adrenal no renal masses. No nephrolithiasis nor obstructive uropathy. No hydroureteronephrosis. Unremarkable bladder. Stomach/Bowel: Nondistended stomach with normal small bowel rotation. Mild  fluid-filled distention of small bowel loops with slight  mural thickening of jejunum may reflect a mild small bowel enteritis. No mechanical source obstruction is seen. Colon is. Normal appearing appendix. Lymphatic: No abdominopelvic adenopathy. Reproductive: Normal size prostate and seminal vesicles. Other: Small amount of fluid in the left inguinal canal. Musculoskeletal: Plate and screw fixation of the left acetabulum. 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 No acute osseous abnormality. There is lower lumbar degenerative facet arthropathy. Minimal anterolisthesis of L4 on L5. Marked disc space narrowing at L5-S1. Review of the MIP images confirms the above findings. IMPRESSION: Chest CT: 1. No acute pulmonary embolus. 2. No aortic aneurysm or dissection. 3. Bilateral moderate-sized pleural effusions slightly smaller on the right with loculated component redemonstrated. Adjacent compressive atelectasis is seen both lower lobes. 4. Relatively unchanged patchy airspace opacity in the left upper lobe only minimally improved in aeration. 5. Redemonstration of focal kyphosis at T6-7 secondary to endplate irregularities and disc space narrowing suspicious for changes of discitis/osteomyelitis. MRI would be the study of choice for further correlation as to acuity. Abdomen and pelvic CT: 1. No aortic aneurysm or dissection. Patent branch vessels without significant stenosis or focal abnormality. 2. Mild fluid-filled distention of small bowel which transmural thickening suspicious for changes of mild enteritis. No mechanical bowel obstruction. Electronically Signed   By: Tollie Eth M.D.   On: 08/14/2017 21:21   US Thoracentesis Asp Pleural Space W/img Guide  Result Date: 08/10/2017 INDICATION: Respiratory failure. Large right pleural effusion. Request for diagnostic and therapeutic thoracentesis. EXAM: ULTRASOUND GUIDED RIGHT THORACENTESIS MEDICATIONS: None. COMPLICATIONS: None immediate. PROCEDURE: An  ultrasound guided thoracentesis was thoroughly discussed with the patient and questions answered. The benefits, risks, alternatives and complications were also discussed. The patient understands and wishes to proceed with the procedure. Written consent was obtained. Ultrasound was performed to localize and mark an adequate pocket of fluid in the right chest. The area was then prepped and draped in the normal sterile fashion. 1% Lidocaine was used for local anesthesia. Under ultrasound guidance a Safe-T-Centesis catheter was introduced. Thoracentesis was performed. The catheter was removed and a dressing applied. FINDINGS: A total of approximately 1.4 L of pink/blood-tinged fluid was removed. Samples were sent to the laboratory as requested by the clinical team. IMPRESSION: Successful ultrasound guided right thoracentesis yielding 1.4 L of pleural fluid. Read by: Brayton El PA-C Electronically Signed   By: Corlis Leak M.D.   On: 08/10/2017 12:55     Howard Pouch, MD 08/23/2017, 11:53 AM PGY-2, [facility] FPTS Intern pager: [phone], text pages welcome

## 2017-08-23 NOTE — Progress Notes (Signed)
Progress Note  Patient Name: Marcus [name] Date of Encounter: 08/23/2017  Primary Cardiologist: Dr. Armanda Magicraci Turner  Subjective   Restless overnight, frequent stools.  No chest pain or shortness of breath at rest.  Inpatient Medications    Scheduled Meds: . enoxaparin (LOVENOX) injection  40 mg Subcutaneous Q24H  . feeding supplement (ENSURE ENLIVE)  237 mL Oral TID BM  . folic acid  1 mg Oral Daily  . furosemide  40 mg Intravenous Daily  . LORazepam  1 mg Intravenous Once  . multivitamin with minerals  1 tablet Oral Daily  . mupirocin ointment   Nasal BID  . oxyCODONE-acetaminophen  1 tablet Oral Q6H  . potassium chloride  20 mEq Oral Daily  . potassium chloride  40 mEq Oral Once  . sodium chloride flush  3 mL Intravenous Q12H  . thiamine  100 mg Oral Daily   Or  . thiamine  100 mg Intravenous Daily  . vancomycin  125 mg Oral QID   Continuous Infusions: . sodium chloride    . sodium chloride 75 mL/hr at 08/22/17 1221  .  ceFAZolin (ANCEF) IV Stopped (08/23/17 0323)  . lactated ringers 10 mL/hr at 08/21/17 1230  . sodium chloride irrigation     PRN Meds: sodium chloride, albuterol, ondansetron (ZOFRAN) IV   Vital Signs    Vitals:   08/22/17 0508 08/22/17 1930 08/23/17 0100 08/23/17 0500  BP: 97/66 (!) 108/56  100/62  Pulse: 90 (!) 110 95 100  Resp: 18 19 16 19   Temp: 99.1 F (37.3 C) 98.8 F (37.1 C)  98.8 F (37.1 C)  TempSrc: Oral Oral  Axillary  SpO2: 94% 97% 94% 97%  Weight: 123 lb 10.9 oz (56.1 kg)   129 lb 3.2 oz (58.6 kg)  Height:        Intake/Output Summary (Last 24 hours) at 08/23/2017 0801 Last data filed at 08/23/2017 0500 Gross per 24 hour  Intake -  Output 352 ml  Net -352 ml   Filed Weights   08/21/17 0507 08/22/17 0508 08/23/17 0500  Weight: 124 lb 12.5 oz (56.6 kg) 123 lb 10.9 oz (56.1 kg) 129 lb 3.2 oz (58.6 kg)    Telemetry    Sinus rhythm and sinus tachycardia, bursts of SVT noted.  Personally reviewed.  Physical Exam   GEN:   Chronically ill-appearing.  No acute distress.   Neck: No JVD. Cardiac: RRR, 3/6 apical systolic murmur, no gallop.  Respiratory: Nonlabored. Clear to auscultation bilaterally. GI: Soft, nontender, bowel sounds present. MS: No edema; No deformity. Neuro:  Nonfocal. Psych: Alert and oriented x 3.  Calm.  Labs    Chemistry Recent Labs  Lab 08/20/17 0319 08/21/17 0407 08/22/17 0404  NA 132* 128* 129*  K 3.7 3.2* 3.5  CL 97* 93* 94*  CO2 24 23 25   GLUCOSE 132* 114* 109*  BUN 18 16 20   CREATININE 1.06 1.02 1.00  CALCIUM 8.1* 8.2* 8.1*  GFRNONAA >60 >60 >60  GFRAA >60 >60 >60  ANIONGAP 11 12 10      Hematology Recent Labs  Lab 08/21/17 0407 08/22/17 0404 08/23/17 0635  WBC 26.2* 25.0* 17.4*  RBC 3.45* 3.09* 2.99*  HGB 9.9* 8.9* 8.3*  HCT 30.6* 27.4* 26.6*  MCV 88.7 88.7 89.0  MCH 28.7 28.8 27.8  MCHC 32.4 32.5 31.2  RDW 14.8 14.8 14.7  PLT 286 276 291    Radiology    No results found.  Cardiac Studies   Cardiac catheterization 08/19/2017:  Mid RCA lesion is 25% stenosed.  Prox LAD lesion is 25% stenosed.  Ost 1st Diag lesion is 70% stenosed. This is a small vessel.  LV end diastolic pressure is normal.  There is no aortic valve stenosis.  Hemodynamic findings consistent with mild pulmonary hypertension.  Ao sat 96%. PA sat 61%. CO 5.03 L/min. CI 2.91. Prominent V waves on the PCWP tracing.   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  Mild pulmonary hypertension with prominent V waves noted, consistent with severe mitral regurgitation.  TEE 08/14/2017: Study Conclusions  - Left ventricle: The cavity size was normal. There was mild   concentric hypertrophy. Systolic function was normal. The   estimated ejection fraction was in the range of 60% to 65%. - Aortic valve: Trileaflet; mildly thickened leaflets. There was   trivial regurgitation. - Mitral valve: Vegetation of the anterior mitral leaflet. Ruptured   anterior leaflet cord with prolaspe of  the leaflet. There is also   a 0.5 cm round perforation of the anterior leaflet with severe   regurgitation - seen well in 2D and 3D modes. Regurgitant VTI: 94   cm. Effective regurgitant orifice (PISA): 0.57 cm^2. Regurgitant   volume (PISA): 54 ml. - Left atrium: No evidence of thrombus in the atrial cavity or   appendage. - Right atrium: The atrium was dilated. - Atrial septum: No defect or patent foramen ovale was identified.  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   CT surgical evaluation is recommended.  Patient Profile     52 y.o. 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  Assessment & Plan    1.  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  He continues on IV antibiotic therapy as directed by Infectious Disease service.  He is undergoing dental extractions in anticipation ultimately of mitral valve surgery with Dr. Cornelius Moras.  Currently afebrile.  WBC 17.4 down from 25.0.  2.  Severe mitral regurgitation in the setting of endocarditis with anterior leaflet perforation and prolapse.  3.  Acute diastolic heart failure associated with valvular heart disease.  4.  Clostridium difficile colitis.  5.  Deconditioning and malnutrition.  6.  History of polysubstance abuse.  I reviewed the patient's chart and recent cardiology notes.  Mitral valve surgery is deferred for the present time until patient's overall clinical status improves - he is being followed by Dr. Cornelius Moras.  Patient has nonobstructive CAD by cardiac catheterization.  He remains on IV Lasix with potassium supplementation, relatively mild diuresis last 24 hours.  Consider follow-up chest x-ray eventually with pleural effusions documented by  MRI.  Signed, Nona Dell, MD  08/23/2017, 8:01 AM

## 2017-08-23 NOTE — Progress Notes (Signed)
I informed Patient about new order for rectal tube. I provided education about the tube that it would collect the stool and prevent skin break down. The patient refused  and stated  " that aint going to happen."

## 2017-08-23 NOTE — Progress Notes (Signed)
Attempted to place flexiseal, pt declined. " I don't want that thing in my butt".

## 2017-08-24 DIAGNOSIS — I25119 Atherosclerotic heart disease of native coronary artery with unspecified angina pectoris: Secondary | ICD-10-CM

## 2017-08-24 LAB — BASIC METABOLIC PANEL
Anion gap: 9 (ref 5–15)
BUN: 16 mg/dL (ref 6–20)
CO2: 27 mmol/L (ref 22–32)
Calcium: 8 mg/dL — ABNORMAL LOW (ref 8.9–10.3)
Chloride: 95 mmol/L — ABNORMAL LOW (ref 101–111)
Creatinine, Ser: 0.89 mg/dL (ref 0.61–1.24)
GFR calc Af Amer: >60 mL/min (ref >60)
Glucose, Bld: 105 mg/dL — ABNORMAL HIGH (ref 65–99)
POTASSIUM: 3.6 mmol/L (ref 3.5–5.1)
SODIUM: 131 mmol/L — AB (ref 135–145)

## 2017-08-24 LAB — CBC
HCT: 26.4 % — ABNORMAL LOW (ref 39.0–52.0)
HEMOGLOBIN: 8.4 g/dL — AB (ref 13.0–17.0)
MCH: 28.5 pg (ref 26.0–34.0)
MCHC: 31.8 g/dL (ref 30.0–36.0)
MCV: 89.5 fL (ref 78.0–100.0)
PLATELETS: 336 10*3/uL (ref 150–400)
RBC: 2.95 MIL/uL — AB (ref 4.22–5.81)
RDW: 15.1 % (ref 11.5–15.5)
WBC: 15.6 10*3/uL — AB (ref 4.0–10.5)

## 2017-08-24 MED ORDER — ASPIRIN EC 81 MG PO TBEC
81.0000 mg | DELAYED_RELEASE_TABLET | Freq: Every day | ORAL | Status: DC
Start: 2017-08-24 — End: 2017-08-27
  Administered 2017-08-24 – 2017-08-27 (×4): 81 mg via ORAL
  Filled 2017-08-24 (×4): qty 1

## 2017-08-24 MED ORDER — FIDAXOMICIN 200 MG PO TABS: 200.0000 mg | ORAL_TABLET | Freq: Two times a day (BID) | ORAL | Status: DC

## 2017-08-24 MED ORDER — FIDAXOMICIN 200 MG PO TABS
200.0000 mg | ORAL_TABLET | Freq: Two times a day (BID) | ORAL | Status: DC
  Administered 2017-08-24 – 2017-08-27 (×7): 200 mg via ORAL
  Filled 2017-08-24 (×8): qty 1

## 2017-08-24 NOTE — Progress Notes (Signed)
Family Medicine Teaching Service Daily Progress Note Intern Pager: [phone]  Patient name: Marcus [name] Medical record number: [id number] Date of birth: Oct 27, 1965 Age: 52 y.o. Gender: male  Primary Care Provider: Patient, No Pcp Per Consultants: CCM, ID, Cardiology, CVTS, Dental Code Status: Full   Pt Overview and Major Events to Date:  3/09: Admit forsepsis thought to be secondary to HAP and new onsetHF,cultures obtained, broad-spectrum antibiotics initiated 3/10: PCCM consulted,tolerating BiPAP 3/10: thoracentesis 3/14: TEE with severe MR, CVTS consulted  3/20: C diff positive - oral vanc startred 3/21: full dental extraction  Assessment and Plan: Marcus [name] a 52 y.o.malepresenting with worsening SOB. 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  Endocarditis of mitral valve h/oembolic strokeand metabolic encephalopathy new onset heart failure Patient does have signs concerning for new onset heart failure 2/2 vavular dysfunction.Leukocytosis improved today at 15.6 from 17.4 -cardiology consulted, appreciate recommendations:can consider low dose ASA -daily weight -strict Is and Os -CVTS consulted, appreciate recommendations: not candidate for surgery until colitis is completely resolved   -ID following: keep on ancef pending CVTS sx (end date 09/24/17), spoke to Dr. Ilsa Iha today who states she plans on seeing patient to answer any questions he has  -Dentistry consulted,appreciate recommendations: s/p full extraction 3/21 -continue ancef (3/15-)  Osteomyelitis MRI on spine on 08/19/17 showing T7-T8 discitis-osteomyelitis without abscess and C6-7 suspicious for discitis-osteomyelitis without abscess. Mild spinal stenosis of T7-8 also seen.  -ID following, appreciate recommendations:continue abx as described above, no MRI needed  Anemia Chronic, stable.Current Hgb8.4.  Appears to be chronically low with baseline of 8-9. Normocytic  -continue to monitor with daily CBC  Left knee edema h/o left MSSA septic arthritis Presented with lower extremity edema. Does have recent septic arthritis of the knee.DVT r/o with LE dopplers. -continue to monitor   Hepatitis C genotype 2B Noted during previous hospitalization. No prior treatment. Recent viral load 224K.LFTsstable.  -ID consulted, appreciate recommendations: recommended outpatient tx   Compression deformities of mid to lower thoracic spine Patientreports history of fall back in January 2019he continues to have persistent low back pain since discharge. Patient reports use of OxyContin and oxycodone for pain control though no medications per chart review. PT recommending SNF.  -continuepercocet 5-325 q6H scheduled, wean as tolerated -MRI spineunder general anesthesia  Polysubstance abuse UDS positive for barbiturates and opiates on presentation. Also has history of cocaine back in January 2019. Has known endocarditis likely from IV drug use though patient reports no recentillicit drug use.  -Continue onCIWA : 4>3 overnight -Daily folate and thiamine -monitor COWS score: 8>6>6 overnight -percocet 5 Q6, can plan to wean as tolerated  Hyponatremia Improving. Current Na stable/low at 131.Likely 2/2 poor oral intake as patient has been NPO on and off for several days and likely had poor oral intake prior to admission. -monitor on daily BMP -encourage POintake  Hypokalemia-resolved K of3.6 -replete with KDur as needed -monitor on daily BMP  Protein calorie malnutrition  Albumin of 2.3 on 3/16. Likely 2/2 poor oral intake -consult nutrition, appreciate recommendations  C-diff -enteric precautions -PO vancomycin for same time as systemic therapy for MSSA and probably at least 1 week beyond per ID recommendations   Disposition Will likely need SNF due to need for longer  term care and PICC line management. -CSW consulted appreciate recommendations: bed offered at [facility], [place] but patient refusing due to negative experience during last admission there  FEN/GI:soft diet, NS @75cc /hr  ONG:EXBM,WUXL discontinuedlovenoxfor dental procedure.  Disposition: will need SNF placement for continued IV abx   Subjective:  Patient today states he feels about the same. Denies SOB or CP. States he is still having loose stools and reports one BM today. Continued back pain. Patient is very upset about SNF placement to [place]. Patient states he would rather "do without then have to go back there". Patient states "I almost died twice when I was there". Patient states his mother is also very upset by placement to that facility.   Objective: Temp:  [97.6 F (36.4 C)-98.4 F (36.9 C)] 98.4 F (36.9 C) (03/24 0548) Pulse Rate:  [87-101] 87 (03/24 0548) Resp:  [14-28] 14 (03/24 0548) BP: (95-99)/(60-68) 99/68 (03/24 0548) SpO2:  [90 %-97 %] 90 % (03/24 0548) Weight:  [130 lb 8.2 oz (59.2 kg)] 130 lb 8.2 oz (59.2 kg) (03/24 0548) Physical Exam: General: awake and alert, laying in bed, NAD  Cardiovascular: RRR, grade 2 murmur  Respiratory: CTAB, no wheezes, rales or rhonchi  Abdomen: soft, non tender, non distended, bowel sounds present  Extremities: non tender, no edema   Laboratory: Recent Labs  Lab 08/22/17 0404 08/23/17 0635 08/24/17 0400  WBC 25.0* 17.4* 15.6*  HGB 8.9* 8.3* 8.4*  HCT 27.4* 26.6* 26.4*  PLT 276 291 336   Recent Labs  Lab 08/22/17 0404 08/23/17 0635 08/24/17 0400  NA 129* 128* 131*  K 3.5 3.5 3.6  CL 94* 93* 95*  CO2 25 25 27   BUN 20 18 16   CREATININE 1.00 0.88 0.89  CALCIUM 8.1* 7.7* 8.0*  GLUCOSE 109* 107* 105*    Imaging/Diagnostic Tests: Dg Orthopantogram  Result Date: 08/15/2017 CLINICAL DATA:  Poor dentition.  Cardiac workup. EXAM: ORTHOPANTOGRAM/PANORAMIC COMPARISON:  None. FINDINGS: There are numerous missing  maxillary and mandibular teeth, and multiple remaining maxillary teeth appear broken. There is mild periapical lucency associated with a left maxillary premolar tooth. IMPRESSION: Poor dentition with multiple missing and broken teeth. Mild periapical lucency associated with a left maxillary premolar tooth, abscess not excluded. Electronically Signed   By: Sebastian Ache M.D.   On: 08/15/2017 09:12   Dg Chest 2 View  Result Date: 08/23/2017 CLINICAL DATA:  Bilateral pleural effusions EXAM: CHEST - 2 VIEW COMPARISON:  08/14/2017 the FINDINGS: Moderate right pleural effusion is similar to prior. Tiny left pleural effusion noted. Airspace opacity in the right base has decreased in the interval. Cardiopericardial silhouette is at upper limits of normal for size. The visualized bony structures of the thorax are intact. Telemetry leads overlie the chest. IMPRESSION: Interval decrease and airspace disease at the right base with similar appearance of moderate right and small left pleural effusions. Electronically Signed   By: Kennith Center M.D.   On: 08/23/2017 16:41   Dg Chest 2 View  Result Date: 08/14/2017 CLINICAL DATA:  Followup pneumonia. EXAM: CHEST - 2 VIEW COMPARISON:  08/10/2016 and older studies. FINDINGS: Moderate right and small left pleural effusions. There is associated lung base opacity, greater on the right, which is consistent with atelectasis, pneumonia or a combination. The patchy airspace opacity noted along the perihilar left lung on the prior study has improved. Airspace opacity in the right mid to lower lung appears somewhat less confluent but is unchanged in extent. No new lung abnormalities.  No pneumothorax. IMPRESSION: 1. Mild improvement in lung consolidation compared to the prior exam. 2. Persistent moderate right and small left pleural effusions. No new abnormalities. Electronically Signed   By: Amie Portland  M.D.   On: 08/14/2017 13:16   Dg Chest 2 View  Addendum Date: 08/09/2017    ADDENDUM REPORT: 08/09/2017 15:41 ADDENDUM: After further review, there are compression deformities of 2 adjacent vertebral bodies in the mid to lower thoracic spine, of uncertain age but new compared to an older chest x-ray 02/15/2005. Would consider further characterization with CT. These results were called by telephone at the time of interpretation on 08/09/2017 at 3:40 pm to Dr. Doug Sou , who verbally acknowledged these results. Electronically Signed   By: Bary Richard M.D.   On: 08/09/2017 15:41   Result Date: 08/09/2017 CLINICAL DATA:  Shortness of breath for 2 days. Bacterial meningitis diagnosed at the beginning of January, than recently diagnosed with pneumonia. History of TIA in January with left-sided weakness. Former smoker. EXAM: CHEST - 2 VIEW COMPARISON:  Chest x-ray dated 06/08/2017. FINDINGS: New dense opacity at the right lung base, likely a combination of consolidation and pleural effusion. Pleural effusion component is at least moderate in size. Patchy opacities are seen within the left perihilar and lower lung zones, favor edema. Additional opacity at the left lung base is likely a combination of atelectasis and small pleural effusion. Heart size and mediastinal contours are stable. Osseous structures about the chest are unremarkable. IMPRESSION: 1. New dense opacity at the right lung base, likely a combination of pneumonia or atelectasis and pleural effusion. The pleural effusion component is at least moderate in size. Would consider chest CT for further characterization. 2. Additional patchy ill-defined opacities within the left mid and lower lung, most likely edema, pneumonia considered less likely. 3. Probable mild atelectasis and/or small pleural effusion at the left lung base. Electronically Signed: By: Bary Richard M.D. On: 08/09/2017 15:29   Ct Angio Chest Pe W And/or Wo Contrast  Result Date: 08/09/2017 CLINICAL DATA:  SOB x2 days. Pt diagnosed with bacterial meningitis at  the beginning of January, then recently diagnosed with pneumonia. Pt also has hx of TIA beginning of January that left him with left sided weakness. Patient had left knee surgery x 6 weeks ago for sepsis. EXAM: CT ANGIOGRAPHY CHEST WITH CONTRAST TECHNIQUE: Multidetector CT imaging of the chest was performed using the standard protocol during bolus administration of intravenous contrast. Multiplanar CT image reconstructions and MIPs were obtained to evaluate the vascular anatomy. CONTRAST:  ISOVUE-370 IOPAMIDOL (ISOVUE-370) INJECTION 76% COMPARISON:  Current chest radiographs and prior studies. FINDINGS: Cardiovascular: Satisfactory opacification of the pulmonary arteries to the segmental level. No evidence of pulmonary embolism. Heart is normal in size and configuration. No pericardial effusion. No coronary artery calcifications. The great vessels normal in caliber. No aortic atherosclerosis. No dissection. Mediastinum/Nodes: No neck base or axillary masses or pathologically enlarged lymph nodes. No mediastinal or hilar masses or discrete enlarged lymph nodes. The trachea is patent and normal in caliber. Esophagus is unremarkable. Lungs/Pleura: Large right and small left pleural effusions. Complete atelectasis of the right middle lobe and near complete atelectasis of the right lower lobe. Small area of consolidation in the anterior inferior right upper lobe. Patchy consolidation is noted in a peribronchovascular distribution in the left upper lobe and, to lesser degree, left lower lobe. There is dependent opacity also in the left lower lobe consistent with atelectasis. No pneumothorax. Upper Abdomen: No acute abnormality. Musculoskeletal: There is marked narrowing of the T6-T7 disc space with resorption/irregularity of the lower endplate of T6 in upper endplate of T7, with mild loss of height of these vertebra. This leads to  a mild focal kyphosis. This is new since a chest radiograph dated 02/15/2005. A subtle  lucency across the T6 spinous process posteriorly suggests a previous fracture. All remaining vertebral bodies are normal in height. No osteoblastic or osteolytic lesions. Review of the MIP images confirms the above findings. IMPRESSION: 1. Patchy areas of consolidation in both lungs consistent with multifocal pneumonia. 2. Complete atelectasis of the right middle lobe and near complete atelectasis of the right lower lobe. Mild dependent atelectasis in the left lower lobe. 3. Large right and small left pleural effusions. 4. Marked loss of the disc space at T6-C7 with irregular resorption of endplates leading to loss of vertebral body height of T6 and T7 and a focal kyphosis. This may reflect the sequelae of an old fracture. It could be due to previous discitis/osteomyelitis. Electronically Signed   By: Amie Portland M.D.   On: 08/09/2017 18:44   Mr Brain Wo Contrast  Result Date: 08/14/2017 CLINICAL DATA:  52 y/o  M; stroke follow-up. EXAM: MRI HEAD WITHOUT CONTRAST TECHNIQUE: Axial DWI sequence was acquired. Patient combative, unable to continue examination. COMPARISON:  06/09/2017 MRI head.  06/08/2017 CT head. FINDINGS: Low B value DWI sequence demonstrates areas of T2 hyperintensity in the right greater than left frontal, parietal, and occipital lobes as well as splenium of corpus callosum and cerebellum corresponding to infarctions on prior MRI. The high B value sequence is motion degraded, insufficient to assess for reduced diffusion and acute stroke. IMPRESSION: Severe motion degraded DWI sequence insufficient to assess for acute stroke. Repeat imaging is recommended when patient is able to hold still and follow commands. Electronically Signed   By: Mitzi Hansen M.D.   On: 08/14/2017 21:38   Mr Laqueta Jean ZO Contrast  Result Date: 08/19/2017 CLINICAL DATA:  Hospitalized in January with bacteremia, mitral valve endocarditis, CNS septic emboli, and thoracic spine discitis. Persistent back pain.  EXAM: MRI HEAD WITHOUT AND WITH CONTRAST MRI THORACIC SPINE WITHOUT AND WITH CONTRAST MRI LUMBAR SPINE WITHOUT AND WITH CONTRAST TECHNIQUE: Multiplanar, multiecho pulse sequences of the brain and surrounding structures, and cervical spine, to include the craniocervical junction and cervicothoracic junction, were obtained without and with intravenous contrast. Multiplanar and multiecho pulse sequences of the thoracic and lumbar spine were obtained without and with intravenous contrast. CONTRAST:  9mL MULTIHANCE GADOBENATE DIMEGLUMINE 529 MG/ML IV SOLN COMPARISON:  Brain MRI 06/09/2017. CTA chest, abdomen, and pelvis 08/14/2017. Lumbar spine CT 12/30/2015. FINDINGS: MRI HEAD FINDINGS Brain: Embolic infarcts throughout the brain on the prior MRI demonstrate interval evolution. Associated diffusion abnormality has decreased though not completely resolved in some of the areas of infarction, and there is developing encephalomalacia. Associated chronic blood products are present with the majority of the infarcts. Multiple infarcts demonstrate enhancement. No definite restricted diffusion is seen to clearly indicate acute infarction. There are a few subcentimeter infarcts in the left centrum semiovale which are new from the prior MRI and demonstrate mild trace diffusion abnormality without reduced ADC, consistent with interval subacute infarcts. No intracranial mass, midline shift, or extra-axial fluid collection is seen. There is mild cerebral atrophy. No abnormal meningeal enhancement is identified. Vascular: Major intracranial vascular flow voids are preserved. Skull and upper cervical spine: Unremarkable bone marrow signal. Sinuses/Orbits: Unremarkable orbits. Clear paranasal sinuses. Trace left mastoid fluid. Other: None. MRI THORACIC SPINE FINDINGS Spinal numbering is performed by counting down from the craniocervical junction. This results in different numbering than what was reported on the prior CTA due to  transitional lumbosacral  anatomy. Some sequences are up to moderately motion degraded. Alignment: Slight focal kyphosis at T7-8.  No listhesis. Vertebrae: Disc and endplate destruction at T7-8 as seen on prior CT with diffuse enhancement throughout the residual disc space and marrow of both T7 and T8 vertebral bodies extending into the pedicles. Minimal dorsal epidural enhancement at T7-8 without evidence of epidural abscess. Partially visualized marrow edema and enhancement in the C6 and C7 vertebral bodies with endplate irregularity, disc space narrowing, and enhancement anteriorly in the disc space. No evidence of C6-7 epidural abscess on limited sagittal imaging. Very mild endplate edema and enhancement at T11-12 favored to be degenerative given lack of disc abnormality or endplate erosion to suggest infection. Background diminished T1 bone marrow signal intensity diffusely is likely related to patient's known anemia. 1.4 cm enhancing lesion in the posterior elements on the right at T3 is consistent with an hemangioma. A smaller atypical hemangioma is also suspected in the right T2 pedicle. Cord:  Normal signal. Paraspinal and other soft tissues: Paravertebral soft tissue edema/phlegmon from T6-T8. Interspinous soft tissue edema and enhancement at T7-8. No paraspinal abscess identified. Partially visualized prevertebral inflammation in the lower cervical spine. Moderate right and small left pleural effusions with associated compressive atelectasis on the right. Small volume fluid in the esophagus. Partially visualized endotracheal tube and tracheal secretions. Disc levels: Moderate disc space narrowing, disc bulging, and endplate spurring at C7-T1 result in moderate right and mild-to-moderate left neural foraminal stenosis and at most mild spinal stenosis. Broad-based posterior disc osteophyte complex and facet hypertrophy at T7-8 result in mild spinal stenosis without cord compression. Mild disc bulging and  scattered tiny disc protrusions elsewhere in the thoracic spine do not result in spinal stenosis. MRI LUMBAR SPINE FINDINGS Some sequences are mildly motion degraded. Segmentation: Transitional lumbosacral anatomy with a lumbarized S1. Alignment: Facet mediated anterolisthesis of L5 on S1 measuring 4 mm. Vertebrae: Diffusely diminished T1 bone marrow signal intensity likely related to anemia. Predominantly type 2 degenerative endplate changes at S1-2. Mild type 1 changes at L3-4. No evidence of infectious discitis-osteomyelitis. No fracture. Conus medullaris: Extends to the L2 level and appears normal. Paraspinal and other soft tissues: Mild nonspecific posterior paraspinal muscle edema bilaterally in the mid and lower lumbar spine. No fluid collection. Disc levels: L1-2: Negative. L2-3: Minimal disc bulging without stenosis. L3-4: Mild disc space narrowing. Mild disc bulging and mild facet and ligamentum flavum hypertrophy result in mild bilateral lateral recess stenosis and mild left neural foraminal stenosis without significant spinal stenosis. L4-5: Mild disc space narrowing. Mild disc bulging asymmetric to the right and moderate right and mild left facet hypertrophy result in mild right lateral recess stenosis and mild right neural foraminal stenosis without significant spinal stenosis. L5-S1: Anterolisthesis with disc uncovering and severe facet hypertrophy result in mild spinal stenosis, mild-to-moderate bilateral lateral recess stenosis, and mild-to-moderate bilateral neural foraminal stenosis. S1-2: Transitional level with disc space narrowing. Mild disc bulging and moderate facet arthrosis result in mild left neural foraminal stenosis without spinal stenosis. IMPRESSION: 1. 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 2. Interval tiny subacute infarcts in the left centrum semiovale. No acute infarct. 3. Transitional lumbosacral anatomy as above. 4. 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 No epidural or paraspinal abscess. 5. Partially visualized disc and endplate enhancement at C6-7 suspicious for discitis-osteomyelitis with prevertebral inflammation. No evidence of epidural abscess though this was incompletely imaged. 6. Mild spinal stenosis at T7-8. 7. No evidence of discitis-osteomyelitis in the  lumbar spine. 8. Nonspecific bilateral lumbar posterior paraspinal muscle edema without abscess. 9. Lumbar disc and facet degeneration most notable at L5-S1 where there is grade 1 anterolisthesis, mild spinal stenosis, and mild-to-moderate lateral recess and neural foraminal stenosis. 10. Moderate right and small left pleural effusions. Electronically Signed   By: Sebastian Ache M.D.   On: 08/19/2017 15:55   Mr Thoracic Spine W Wo Contrast  Result Date: 08/19/2017 CLINICAL DATA:  Hospitalized in January with bacteremia, mitral valve endocarditis, CNS septic emboli, and thoracic spine discitis. Persistent back pain. EXAM: MRI HEAD WITHOUT AND WITH CONTRAST MRI THORACIC SPINE WITHOUT AND WITH CONTRAST MRI LUMBAR SPINE WITHOUT AND WITH CONTRAST TECHNIQUE: Multiplanar, multiecho pulse sequences of the brain and surrounding structures, and cervical spine, to include the craniocervical junction and cervicothoracic junction, were obtained without and with intravenous contrast. Multiplanar and multiecho pulse sequences of the thoracic and lumbar spine were obtained without and with intravenous contrast. CONTRAST:  9mL MULTIHANCE GADOBENATE DIMEGLUMINE 529 MG/ML IV SOLN COMPARISON:  Brain MRI 06/09/2017. CTA chest, abdomen, and pelvis 08/14/2017. Lumbar spine CT 12/30/2015. FINDINGS: MRI HEAD FINDINGS Brain: Embolic infarcts throughout the brain on the prior MRI demonstrate interval evolution. Associated diffusion abnormality has decreased though not completely resolved in some of the areas of infarction, and there is developing encephalomalacia. Associated chronic blood  products are present with the majority of the infarcts. Multiple infarcts demonstrate enhancement. No definite restricted diffusion is seen to clearly indicate acute infarction. There are a few subcentimeter infarcts in the left centrum semiovale which are new from the prior MRI and demonstrate mild trace diffusion abnormality without reduced ADC, consistent with interval subacute infarcts. No intracranial mass, midline shift, or extra-axial fluid collection is seen. There is mild cerebral atrophy. No abnormal meningeal enhancement is identified. Vascular: Major intracranial vascular flow voids are preserved. Skull and upper cervical spine: Unremarkable bone marrow signal. Sinuses/Orbits: Unremarkable orbits. Clear paranasal sinuses. Trace left mastoid fluid. Other: None. MRI THORACIC SPINE FINDINGS Spinal numbering is performed by counting down from the craniocervical junction. This results in different numbering than what was reported on the prior CTA due to transitional lumbosacral anatomy. Some sequences are up to moderately motion degraded. Alignment: Slight focal kyphosis at T7-8.  No listhesis. Vertebrae: Disc and endplate destruction at T7-8 as seen on prior CT with diffuse enhancement throughout the residual disc space and marrow of both T7 and T8 vertebral bodies extending into the pedicles. Minimal dorsal epidural enhancement at T7-8 without evidence of epidural abscess. Partially visualized marrow edema and enhancement in the C6 and C7 vertebral bodies with endplate irregularity, disc space narrowing, and enhancement anteriorly in the disc space. No evidence of C6-7 epidural abscess on limited sagittal imaging. Very mild endplate edema and enhancement at T11-12 favored to be degenerative given lack of disc abnormality or endplate erosion to suggest infection. Background diminished T1 bone marrow signal intensity diffusely is likely related to patient's known anemia. 1.4 cm enhancing lesion in the  posterior elements on the right at T3 is consistent with an hemangioma. A smaller atypical hemangioma is also suspected in the right T2 pedicle. Cord:  Normal signal. Paraspinal and other soft tissues: Paravertebral soft tissue edema/phlegmon from T6-T8. Interspinous soft tissue edema and enhancement at T7-8. No paraspinal abscess identified. Partially visualized prevertebral inflammation in the lower cervical spine. Moderate right and small left pleural effusions with associated compressive atelectasis on the right. Small volume fluid in the esophagus. Partially visualized endotracheal tube and tracheal secretions. Disc levels:  Moderate disc space narrowing, disc bulging, and endplate spurring at C7-T1 result in moderate right and mild-to-moderate left neural foraminal stenosis and at most mild spinal stenosis. Broad-based posterior disc osteophyte complex and facet hypertrophy at T7-8 result in mild spinal stenosis without cord compression. Mild disc bulging and scattered tiny disc protrusions elsewhere in the thoracic spine do not result in spinal stenosis. MRI LUMBAR SPINE FINDINGS Some sequences are mildly motion degraded. Segmentation: Transitional lumbosacral anatomy with a lumbarized S1. Alignment: Facet mediated anterolisthesis of L5 on S1 measuring 4 mm. Vertebrae: Diffusely diminished T1 bone marrow signal intensity likely related to anemia. Predominantly type 2 degenerative endplate changes at S1-2. Mild type 1 changes at L3-4. No evidence of infectious discitis-osteomyelitis. No fracture. Conus medullaris: Extends to the L2 level and appears normal. Paraspinal and other soft tissues: Mild nonspecific posterior paraspinal muscle edema bilaterally in the mid and lower lumbar spine. No fluid collection. Disc levels: L1-2: Negative. L2-3: Minimal disc bulging without stenosis. L3-4: Mild disc space narrowing. Mild disc bulging and mild facet and ligamentum flavum hypertrophy result in mild bilateral lateral  recess stenosis and mild left neural foraminal stenosis without significant spinal stenosis. L4-5: Mild disc space narrowing. Mild disc bulging asymmetric to the right and moderate right and mild left facet hypertrophy result in mild right lateral recess stenosis and mild right neural foraminal stenosis without significant spinal stenosis. L5-S1: Anterolisthesis with disc uncovering and severe facet hypertrophy result in mild spinal stenosis, mild-to-moderate bilateral lateral recess stenosis, and mild-to-moderate bilateral neural foraminal stenosis. S1-2: Transitional level with disc space narrowing. Mild disc bulging and moderate facet arthrosis result in mild left neural foraminal stenosis without spinal stenosis. IMPRESSION: 1. 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 2. Interval tiny subacute infarcts in the left centrum semiovale. No acute infarct. 3. Transitional lumbosacral anatomy as above. 4. 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 No epidural or paraspinal abscess. 5. Partially visualized disc and endplate enhancement at C6-7 suspicious for discitis-osteomyelitis with prevertebral inflammation. No evidence of epidural abscess though this was incompletely imaged. 6. Mild spinal stenosis at T7-8. 7. No evidence of discitis-osteomyelitis in the lumbar spine. 8. Nonspecific bilateral lumbar posterior paraspinal muscle edema without abscess. 9. Lumbar disc and facet degeneration most notable at L5-S1 where there is grade 1 anterolisthesis, mild spinal stenosis, and mild-to-moderate lateral recess and neural foraminal stenosis. 10. Moderate right and small left pleural effusions. Electronically Signed   By: Sebastian Ache M.D.   On: 08/19/2017 15:55   Mr Lumbar Spine W Wo Contrast  Result Date: 08/19/2017 CLINICAL DATA:  Hospitalized in January with bacteremia, mitral valve endocarditis, CNS septic emboli, and thoracic spine discitis. Persistent  back pain. EXAM: MRI HEAD WITHOUT AND WITH CONTRAST MRI THORACIC SPINE WITHOUT AND WITH CONTRAST MRI LUMBAR SPINE WITHOUT AND WITH CONTRAST TECHNIQUE: Multiplanar, multiecho pulse sequences of the brain and surrounding structures, and cervical spine, to include the craniocervical junction and cervicothoracic junction, were obtained without and with intravenous contrast. Multiplanar and multiecho pulse sequences of the thoracic and lumbar spine were obtained without and with intravenous contrast. CONTRAST:  9mL MULTIHANCE GADOBENATE DIMEGLUMINE 529 MG/ML IV SOLN COMPARISON:  Brain MRI 06/09/2017. CTA chest, abdomen, and pelvis 08/14/2017. Lumbar spine CT 12/30/2015. FINDINGS: MRI HEAD FINDINGS Brain: Embolic infarcts throughout the brain on the prior MRI demonstrate interval evolution. Associated diffusion abnormality has decreased though not completely resolved in some of the areas of infarction, and there is developing encephalomalacia. Associated chronic blood products are present  with the majority of the infarcts. Multiple infarcts demonstrate enhancement. No definite restricted diffusion is seen to clearly indicate acute infarction. There are a few subcentimeter infarcts in the left centrum semiovale which are new from the prior MRI and demonstrate mild trace diffusion abnormality without reduced ADC, consistent with interval subacute infarcts. No intracranial mass, midline shift, or extra-axial fluid collection is seen. There is mild cerebral atrophy. No abnormal meningeal enhancement is identified. Vascular: Major intracranial vascular flow voids are preserved. Skull and upper cervical spine: Unremarkable bone marrow signal. Sinuses/Orbits: Unremarkable orbits. Clear paranasal sinuses. Trace left mastoid fluid. Other: None. MRI THORACIC SPINE FINDINGS Spinal numbering is performed by counting down from the craniocervical junction. This results in different numbering than what was reported on the prior CTA due  to transitional lumbosacral anatomy. Some sequences are up to moderately motion degraded. Alignment: Slight focal kyphosis at T7-8.  No listhesis. Vertebrae: Disc and endplate destruction at T7-8 as seen on prior CT with diffuse enhancement throughout the residual disc space and marrow of both T7 and T8 vertebral bodies extending into the pedicles. Minimal dorsal epidural enhancement at T7-8 without evidence of epidural abscess. Partially visualized marrow edema and enhancement in the C6 and C7 vertebral bodies with endplate irregularity, disc space narrowing, and enhancement anteriorly in the disc space. No evidence of C6-7 epidural abscess on limited sagittal imaging. Very mild endplate edema and enhancement at T11-12 favored to be degenerative given lack of disc abnormality or endplate erosion to suggest infection. Background diminished T1 bone marrow signal intensity diffusely is likely related to patient's known anemia. 1.4 cm enhancing lesion in the posterior elements on the right at T3 is consistent with an hemangioma. A smaller atypical hemangioma is also suspected in the right T2 pedicle. Cord:  Normal signal. Paraspinal and other soft tissues: Paravertebral soft tissue edema/phlegmon from T6-T8. Interspinous soft tissue edema and enhancement at T7-8. No paraspinal abscess identified. Partially visualized prevertebral inflammation in the lower cervical spine. Moderate right and small left pleural effusions with associated compressive atelectasis on the right. Small volume fluid in the esophagus. Partially visualized endotracheal tube and tracheal secretions. Disc levels: Moderate disc space narrowing, disc bulging, and endplate spurring at C7-T1 result in moderate right and mild-to-moderate left neural foraminal stenosis and at most mild spinal stenosis. Broad-based posterior disc osteophyte complex and facet hypertrophy at T7-8 result in mild spinal stenosis without cord compression. Mild disc bulging and  scattered tiny disc protrusions elsewhere in the thoracic spine do not result in spinal stenosis. MRI LUMBAR SPINE FINDINGS Some sequences are mildly motion degraded. Segmentation: Transitional lumbosacral anatomy with a lumbarized S1. Alignment: Facet mediated anterolisthesis of L5 on S1 measuring 4 mm. Vertebrae: Diffusely diminished T1 bone marrow signal intensity likely related to anemia. Predominantly type 2 degenerative endplate changes at S1-2. Mild type 1 changes at L3-4. No evidence of infectious discitis-osteomyelitis. No fracture. Conus medullaris: Extends to the L2 level and appears normal. Paraspinal and other soft tissues: Mild nonspecific posterior paraspinal muscle edema bilaterally in the mid and lower lumbar spine. No fluid collection. Disc levels: L1-2: Negative. L2-3: Minimal disc bulging without stenosis. L3-4: Mild disc space narrowing. Mild disc bulging and mild facet and ligamentum flavum hypertrophy result in mild bilateral lateral recess stenosis and mild left neural foraminal stenosis without significant spinal stenosis. L4-5: Mild disc space narrowing. Mild disc bulging asymmetric to the right and moderate right and mild left facet hypertrophy result in mild right lateral recess stenosis and mild right neural  foraminal stenosis without significant spinal stenosis. L5-S1: Anterolisthesis with disc uncovering and severe facet hypertrophy result in mild spinal stenosis, mild-to-moderate bilateral lateral recess stenosis, and mild-to-moderate bilateral neural foraminal stenosis. S1-2: Transitional level with disc space narrowing. Mild disc bulging and moderate facet arthrosis result in mild left neural foraminal stenosis without spinal stenosis. IMPRESSION: 1. 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 2. Interval tiny subacute infarcts in the left centrum semiovale. No acute infarct. 3. Transitional lumbosacral anatomy as above. 4. 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 No epidural or paraspinal abscess. 5. Partially visualized disc and endplate enhancement at C6-7 suspicious for discitis-osteomyelitis with prevertebral inflammation. No evidence of epidural abscess though this was incompletely imaged. 6. Mild spinal stenosis at T7-8. 7. No evidence of discitis-osteomyelitis in the lumbar spine. 8. Nonspecific bilateral lumbar posterior paraspinal muscle edema without abscess. 9. Lumbar disc and facet degeneration most notable at L5-S1 where there is grade 1 anterolisthesis, mild spinal stenosis, and mild-to-moderate lateral recess and neural foraminal stenosis. 10. Moderate right and small left pleural effusions. Electronically Signed   By: Sebastian Ache M.D.   On: 08/19/2017 15:55   Dg Chest Port 1 View  Result Date: 08/10/2017 CLINICAL DATA:  Post right thora, 1.4 L removed EXAM: PORTABLE CHEST 1 VIEW COMPARISON:  Chest x-ray dated 08/09/2017. FINDINGS: Some improvement in aeration at the left lung base. Residual opacity is presumably atelectasis or pneumonia. Ill-defined opacities persist throughout the left lung, consistent with the multifocal pneumonia better demonstrated on chest CT of 08/09/2017. Additional persistent atelectasis at the left lung base. No pneumothorax. Heart size and mediastinal contours appear stable. IMPRESSION: 1. Improved aeration at the right lung base status post thoracentesis. Residual opacity is presumably atelectasis or pneumonia. No pneumothorax seen. 2. Stable patchy opacities throughout the left lung, compatible with the multifocal pneumonia demonstrated on yesterday's chest CT. Electronically Signed   By: Bary Richard M.D.   On: 08/10/2017 14:54   Ct Angio Chest/abd/pel For Dissection W And/or W/wo  Result Date: 08/14/2017 CLINICAL DATA:  Septic arterial embolism EXAM: CT ANGIOGRAPHY CHEST, ABDOMEN AND PELVIS TECHNIQUE: Multidetector CT imaging through the chest, abdomen and pelvis was performed  using the standard protocol during bolus administration of intravenous contrast. Multiplanar reconstructed images and MIPs were obtained and reviewed to evaluate the vascular anatomy. CONTRAST:  100 cc ISOVUE-370 IOPAMIDOL (ISOVUE-370) INJECTION 76% COMPARISON:  Chest CT 08/09/2017 FINDINGS: CTA CHEST FINDINGS Cardiovascular: No acute pulmonary embolus. Satisfactory opacification of the aorta and pulmonary arteries. Normal size heart without pericardial effusion. Normal branch pattern of the great vessels off the arch. No coronary arteriosclerosis. No aortic atherosclerosis. No aneurysm. No aortic dissection. Mediastinum/Nodes: No supraclavicular, axillary, mediastinal nor hilar lymphadenopathy. Trachea is patent and normal caliber without intraluminal abnormality. Esophagus is unremarkable. No thyromegaly is noted. Lungs/Pleura: Bilateral moderate-sized pleural effusions slightly smaller on the right with loculated component. Adjacent compressive atelectasis is noted. Patchy airspace opacities in the left upper lobe are redemonstrated, with slight improvement in aeration since prior though the vast majority of the pulmonary opacity still persists. Musculoskeletal: Redemonstration of marked disc space narrowing C6-7 with endplate irregularity of the inferior endplate of T6 superior endplate of T7 with resultant mild focal kyphosis. Review of the MIP images confirms the above findings. CTA ABDOMEN AND PELVIS FINDINGS VASCULAR Aorta: Normal caliber aorta without aneurysm, dissection, vasculitis or significant stenosis. Celiac: Patent without evidence of aneurysm, dissection, vasculitis or significant stenosis. SMA: Patent without evidence of aneurysm, dissection, vasculitis or significant  stenosis. Renals: Single renal arteries bilaterally without acute abnormality. No significant stenosis, dissection or evidence of fibromuscular dysplasia. IMA: Patent Inflow: No aneurysm or dissection. Minimal atherosclerotic plaque  in the proximal right common iliac and both distal iliac arteries. No significant stenosis. No dissection or aneurysm. Veins: No obvious venous abnormality within the limitations of this arterial phase study. Review of the MIP images confirms the above findings. NON-VASCULAR Hepatobiliary: No focal liver abnormality is seen. No gallstones, gallbladder wall thickening, or biliary dilatation. Pancreas: Unremarkable. No pancreatic ductal dilatation or surrounding inflammatory changes. Spleen: Heterogeneous enhancement likely due to timing of contrast bolus. No splenomegaly. Adrenals/Urinary Tract: No adrenal no renal masses. No nephrolithiasis nor obstructive uropathy. No hydroureteronephrosis. Unremarkable bladder. Stomach/Bowel: Nondistended stomach with normal small bowel rotation. Mild fluid-filled distention of small bowel loops with slight mural thickening of jejunum may reflect a mild small bowel enteritis. No mechanical source obstruction is seen. Colon is. Normal appearing appendix. Lymphatic: No abdominopelvic adenopathy. Reproductive: Normal size prostate and seminal vesicles. Other: Small amount of fluid in the left inguinal canal. Musculoskeletal: Plate and screw fixation of the left acetabulum. 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 No acute osseous abnormality. There is lower lumbar degenerative facet arthropathy. Minimal anterolisthesis of L4 on L5. Marked disc space narrowing at L5-S1. Review of the MIP images confirms the above findings. IMPRESSION: Chest CT: 1. No acute pulmonary embolus. 2. No aortic aneurysm or dissection. 3. Bilateral moderate-sized pleural effusions slightly smaller on the right with loculated component redemonstrated. Adjacent compressive atelectasis is seen both lower lobes. 4. Relatively unchanged patchy airspace opacity in the left upper lobe only minimally improved in aeration. 5. Redemonstration of focal kyphosis at T6-7 secondary to endplate irregularities and  disc space narrowing suspicious for changes of discitis/osteomyelitis. MRI would be the study of choice for further correlation as to acuity. Abdomen and pelvic CT: 1. No aortic aneurysm or dissection. Patent branch vessels without significant stenosis or focal abnormality. 2. Mild fluid-filled distention of small bowel which transmural thickening suspicious for changes of mild enteritis. No mechanical bowel obstruction. Electronically Signed   By: Tollie Eth M.D.   On: 08/14/2017 21:21   US Thoracentesis Asp Pleural Space W/img Guide  Result Date: 08/10/2017 INDICATION: Respiratory failure. Large right pleural effusion. Request for diagnostic and therapeutic thoracentesis. EXAM: ULTRASOUND GUIDED RIGHT THORACENTESIS MEDICATIONS: None. COMPLICATIONS: None immediate. PROCEDURE: An ultrasound guided thoracentesis was thoroughly discussed with the patient and questions answered. The benefits, risks, alternatives and complications were also discussed. The patient understands and wishes to proceed with the procedure. Written consent was obtained. Ultrasound was performed to localize and mark an adequate pocket of fluid in the right chest. The area was then prepped and draped in the normal sterile fashion. 1% Lidocaine was used for local anesthesia. Under ultrasound guidance a Safe-T-Centesis catheter was introduced. Thoracentesis was performed. The catheter was removed and a dressing applied. FINDINGS: A total of approximately 1.4 L of pink/blood-tinged fluid was removed. Samples were sent to the laboratory as requested by the clinical team. IMPRESSION: Successful ultrasound guided right thoracentesis yielding 1.4 L of pleural fluid. Read by: Brayton El PA-C Electronically Signed   By: Corlis Leak M.D.   On: 08/10/2017 12:55     Oralia Manis, DO 08/24/2017, 11:47 AM PGY-1, [facility] FPTS Intern pager: [phone], text pages welcome

## 2017-08-24 NOTE — Progress Notes (Signed)
[facility]    Date of Admission:  08/09/2017   Total days of antibiotics 15        Day 5 oral vanco        Day 12 cefazolin           ID: Marcus [name] is a 52 y.o. male with  MSSA bacteremia complicated by mitral valve endocarditis, septic CNS emboli, meningitis, septic left knee and T6-7 discitis.  He completed 6 weeks of IV antibiotic therapy but is now back in the hospital with probable worsening of his mitral regurgitation, a persistent vegetation and persistent back pain.  Although all recent cultures, he is being re- treated for probable persistent MSSA infection. He also has superimposed [name] colitis.  Principal Problem:   Enteritis due to Clostridium difficile Active Problems:   Sepsis (HCC)   Endocarditis of mitral valve   Hepatitis C antibody positive in blood   Acute heart failure (HCC)   Chronic bilateral pleural effusions   Severe mitral regurgitation   HAP (hospital-acquired pneumonia)   Protein-calorie malnutrition, severe   Thoracic discitis   Loose stools   Osteomyelitis (HCC)    Subjective: Afebrile, less mouth pain from dental extractions Leukocytosis improving Having 4 BM per day up until today only 1 this morning. But having back pain when getting cleaned  Poor po intake, loss of appetite Family and patient is worried about going to back to the same SNF felt they got poor care  Medications:  . aspirin EC  81 mg Oral Daily  . feeding supplement (ENSURE ENLIVE)  237 mL Oral TID BM  . folic acid  1 mg Oral Daily  . LORazepam  1 mg Intravenous Once  . multivitamin with minerals  1 tablet Oral Daily  . mupirocin ointment   Nasal BID  . oxyCODONE  5 mg Oral Q6H  . potassium chloride  20 mEq Oral Daily  . thiamine  100 mg Oral Daily   Or  . thiamine  100 mg Intravenous Daily  . vancomycin  125 mg Oral QID    Objective: Vital signs in last 24 hours: Temp:  [97.6 F (36.4 C)-98.4 F (36.9 C)] 98.4 F (36.9 C)  (03/24 0548) Pulse Rate:  [87-101] 92 (03/24 1153) Resp:  [14-28] 14 (03/24 0548) BP: (85-99)/(57-68) 85/57 (03/24 1153) SpO2:  [90 %-97 %] 90 % (03/24 0548) Weight:  [130 lb 8.2 oz (59.2 kg)] 130 lb 8.2 oz (59.2 kg) (03/24 0548) Physical Exam  Constitutional: He is oriented to person, place, and time. He appears malnourished, and chronically ill. No distress.  HENT:  Mouth/Throat: Oropharynx is clear and moist. No oropharyngeal exudate.  Cardiovascular: Normal rate, regular rhythm and normal heart sounds. Exam reveals no gallop and no friction rub.  No murmur heard.  Pulmonary/Chest: Effort normal and breath sounds normal. No respiratory distress. He has no wheezes.  Abdominal: Soft. Bowel sounds are decreased. Mildly distension. There is no tenderness.  Lymphadenopathy:  He has no cervical adenopathy.  Neurological: He is alert and oriented to person, place, and time. Global weakness Skin: Skin is warm and dry. No rash noted. No erythema.  Psychiatric: He has a normal mood and affect. His behavior is normal.    Lab Results Recent Labs    08/23/17 0635 08/24/17 0400  WBC 17.4* 15.6*  HGB 8.3* 8.4*  HCT 26.6* 26.4*  NA 128* 131*  K 3.5 3.6  CL 93* 95*  CO2 25 27  BUN 18 16  CREATININE 0.88 0.89   Studies/Results: Dg Chest 2 View  Result Date: 08/23/2017 CLINICAL DATA:  Bilateral pleural effusions EXAM: CHEST - 2 VIEW COMPARISON:  08/14/2017 the FINDINGS: Moderate right pleural effusion is similar to prior. Tiny left pleural effusion noted. Airspace opacity in the right base has decreased in the interval. Cardiopericardial silhouette is at upper limits of normal for size. The visualized bony structures of the thorax are intact. Telemetry leads overlie the chest. IMPRESSION: Interval decrease and airspace disease at the right base with similar appearance of moderate right and small left pleural effusions. Electronically Signed   By: Kennith Center M.D.   On: 08/23/2017 16:41      Assessment/Plan: MSSA endocarditis = continue with cefazolin  C.difficile = for now will change him to dificid in order to minimize risk for recurrence. Can start immodium tomorrow if still having 3-4 watery stools  Malnutrition = please have nutrition give input on recs to increase nutritional intake  [facility] Cell: [phone] Pager: [phone]  08/24/2017, 1:00 PM

## 2017-08-24 NOTE — Progress Notes (Signed)
Progress Note  Patient Name: Marcus [name] Date of Encounter: 08/24/2017  Primary Cardiologist: Dr. Armanda Magicraci Turner  Subjective   Still with frequent stools.  No chest pain or shortness of breath at rest.  Inpatient Medications    Scheduled Meds: . feeding supplement (ENSURE ENLIVE)  237 mL Oral TID BM  . folic acid  1 mg Oral Daily  . LORazepam  1 mg Intravenous Once  . multivitamin with minerals  1 tablet Oral Daily  . mupirocin ointment   Nasal BID  . oxyCODONE  5 mg Oral Q6H  . potassium chloride  20 mEq Oral Daily  . thiamine  100 mg Oral Daily   Or  . thiamine  100 mg Intravenous Daily  . vancomycin  125 mg Oral QID   Continuous Infusions: . sodium chloride    .  ceFAZolin (ANCEF) IV Stopped (08/24/17 0348)  . lactated ringers 10 mL/hr at 08/21/17 1230  . sodium chloride irrigation     PRN Meds: sodium chloride, acetaminophen, albuterol, ondansetron (ZOFRAN) IV   Vital Signs    Vitals:   08/23/17 1146 08/23/17 1722 08/23/17 2133 08/24/17 0548  BP: (!) 87/58 95/66 96/60  99/68  Pulse: 99 (!) 101 95 87  Resp: (!) 27 (!) 28 (!) 25 14  Temp: 99.6 F (37.6 C) 98.4 F (36.9 C) 97.6 F (36.4 C) 98.4 F (36.9 C)  TempSrc: Oral Oral Oral Oral  SpO2: 94% 97% 95% 90%  Weight:    130 lb 8.2 oz (59.2 kg)  Height:        Intake/Output Summary (Last 24 hours) at 08/24/2017 0814 Last data filed at 08/24/2017 0550 Gross per 24 hour  Intake 840 ml  Output 1775 ml  Net -935 ml   Filed Weights   08/22/17 0508 08/23/17 0500 08/24/17 0548  Weight: 123 lb 10.9 oz (56.1 kg) 129 lb 3.2 oz (58.6 kg) 130 lb 8.2 oz (59.2 kg)    Telemetry    Sinus rhythm, brief bursts of SVT noted.  Personally reviewed.  Physical Exam   GEN:  Chronically ill-appearing male.  No acute distress.   Neck: No JVD. Cardiac: RRR, 3/6 apical systolic murmur, no gallop.  Respiratory: Nonlabored. Clear to auscultation bilaterally. GI: Soft, nontender, bowel sounds present. MS: No edema; No  deformity. Neuro:  Nonfocal. Psych: Alert and oriented x 3. Normal affect.  Labs    Chemistry Recent Labs  Lab 08/22/17 0404 08/23/17 0635 08/24/17 0400  NA 129* 128* 131*  K 3.5 3.5 3.6  CL 94* 93* 95*  CO2 25 25 27   GLUCOSE 109* 107* 105*  BUN 20 18 16   CREATININE 1.00 0.88 0.89  CALCIUM 8.1* 7.7* 8.0*  GFRNONAA >60 >60 >60  GFRAA >60 >60 >60  ANIONGAP 10 10 9      Hematology Recent Labs  Lab 08/22/17 0404 08/23/17 0635 08/24/17 0400  WBC 25.0* 17.4* 15.6*  RBC 3.09* 2.99* 2.95*  HGB 8.9* 8.3* 8.4*  HCT 27.4* 26.6* 26.4*  MCV 88.7 89.0 89.5  MCH 28.8 27.8 28.5  MCHC 32.5 31.2 31.8  RDW 14.8 14.7 15.1  PLT 276 291 336    Radiology    Dg Chest 2 View  Result Date: 08/23/2017 CLINICAL DATA:  Bilateral pleural effusions EXAM: CHEST - 2 VIEW COMPARISON:  08/14/2017 the FINDINGS: Moderate right pleural effusion is similar to prior. Tiny left pleural effusion noted. Airspace opacity in the right base has decreased in the interval. Cardiopericardial silhouette is at upper limits of  normal for size. The visualized bony structures of the thorax are intact. Telemetry leads overlie the chest. IMPRESSION: Interval decrease and airspace disease at the right base with similar appearance of moderate right and small left pleural effusions. Electronically Signed   By: Kennith Center M.D.   On: 08/23/2017 16:41    Cardiac Studies   Cardiac cathetrization 09-07-17:  Mid RCA lesion is 25% stenosed.  Prox LAD lesion is 25% stenosed.  Ost 1st Diag lesion is 70% stenosed. This is a small vessel.  LV end diastolic pressure is normal.  There is no aortic valve stenosis.  Hemodynamic findings consistent with mild pulmonary hypertension.  Ao sat 96%. PA sat 61%. CO 5.03 L/min. CI 2.91. Prominent V waves on the PCWP tracing.  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 Mild pulmonary hypertension with prominent V waves noted, consistent with severe mitral regurgitation.  TEE  08/14/2017: Study Conclusions  - Left ventricle: The cavity size was normal. There was mild concentric hypertrophy. Systolic function was normal. The estimated ejection fraction was in the range of 60% to 65%. - Aortic valve: Trileaflet; mildly thickened leaflets. There was trivial regurgitation. - Mitral valve: Vegetation of the anterior mitral leaflet. Ruptured anterior leaflet cord with prolaspe of the leaflet. There is also a 0.5 cm round perforation of the anterior leaflet with severe regurgitation - seen well in 2D and 3D modes. Regurgitant VTI: 94 cm. Effective regurgitant orifice (PISA): 0.57 cm^2. Regurgitant volume (PISA): 54 ml. - Left atrium: No evidence of thrombus in the atrial cavity or appendage. - Right atrium: The atrium was dilated. - Atrial septum: No defect or patent foramen ovale was identified.  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 CT surgical evaluation is recommended.  Patient Profile     52 y.o. 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  Assessment & Plan    1.  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  Continues on Ancef per ID service.  Afebrile with downward trend in WBC.  2.  Severe mitral regurgitation with anterior leaflet perforation and prolapse in the setting of endocarditis.  Patient has been seen by Dr. Cornelius Moras with plan for mitral valve surgery eventually.  He has undergone dental extractions in anticipation of this.  3.  Acute diastolic heart failure in the setting of valvular heart disease.  Clinically improved.  Follow-up chest x-ray shows moderate right and small left residual pleural effusions.  4.  Brief episodes of SVT noted by  telemetry, not clearly symptomatic.  5.  Clostridium difficile colitis, continues to have loose stools.  He refused rectal tube.  He continues on vancomycin per primary team.  6.  History of polysubstance abuse.  IV Lasix was stopped yesterday by primary team.  Net out greater than in of approximately 900 cc last 24 hours.  Eventually consider adding low-dose aspirin with documented nonobstructive CAD.  Lipids from January were very low, likely affected by chronic illness, would not start statin now.  Signed, Nona Dell, MD  08/24/2017, 8:14 AM

## 2017-08-25 DIAGNOSIS — Z8669 Personal history of other diseases of the nervous system and sense organs: Secondary | ICD-10-CM

## 2017-08-25 DIAGNOSIS — I340 Nonrheumatic mitral (valve) insufficiency: Secondary | ICD-10-CM

## 2017-08-25 LAB — CBC
HCT: 27.3 % — ABNORMAL LOW (ref 39.0–52.0)
Hemoglobin: 8.7 g/dL — ABNORMAL LOW (ref 13.0–17.0)
MCH: 28.2 pg (ref 26.0–34.0)
MCHC: 31.9 g/dL (ref 30.0–36.0)
MCV: 88.6 fL (ref 78.0–100.0)
PLATELETS: 328 10*3/uL (ref 150–400)
RBC: 3.08 MIL/uL — ABNORMAL LOW (ref 4.22–5.81)
RDW: 15 % (ref 11.5–15.5)
WBC: 13.7 10*3/uL — ABNORMAL HIGH (ref 4.0–10.5)

## 2017-08-25 LAB — BASIC METABOLIC PANEL
Anion gap: 9 (ref 5–15)
BUN: 14 mg/dL (ref 6–20)
CALCIUM: 8 mg/dL — AB (ref 8.9–10.3)
CO2: 26 mmol/L (ref 22–32)
Chloride: 92 mmol/L — ABNORMAL LOW (ref 101–111)
Creatinine, Ser: 0.86 mg/dL (ref 0.61–1.24)
GFR calc Af Amer: >60 mL/min (ref >60)
GFR calc non Af Amer: >60 mL/min (ref >60)
GLUCOSE: 92 mg/dL (ref 65–99)
Potassium: 4 mmol/L (ref 3.5–5.1)
Sodium: 127 mmol/L — ABNORMAL LOW (ref 135–145)

## 2017-08-25 MED ORDER — GABAPENTIN 100 MG PO CAPS
100.0000 mg | ORAL_CAPSULE | Freq: Every day | ORAL | Status: DC
  Administered 2017-08-25 – 2017-08-26 (×2): 100 mg via ORAL
  Filled 2017-08-25 (×2): qty 1

## 2017-08-25 MED ORDER — CEPHALEXIN 500 MG PO CAPS
500.0000 mg | ORAL_CAPSULE | Freq: Three times a day (TID) | ORAL | Status: DC
  Administered 2017-08-25 – 2017-08-27 (×6): 500 mg via ORAL
  Filled 2017-08-25 (×6): qty 1

## 2017-08-25 MED ORDER — SIMETHICONE 40 MG/0.6ML PO SUSP
40.0000 mg | Freq: Four times a day (QID) | ORAL | Status: DC | PRN
  Administered 2017-08-25: 40 mg via ORAL
  Filled 2017-08-25 (×3): qty 0.6

## 2017-08-25 MED ORDER — OXYCODONE HCL 5 MG PO TABS
2.5000 mg | ORAL_TABLET | Freq: Four times a day (QID) | ORAL | Status: DC
  Administered 2017-08-25 – 2017-08-27 (×6): 2.5 mg via ORAL
  Filled 2017-08-25 (×6): qty 1

## 2017-08-25 MED ORDER — ENOXAPARIN SODIUM 40 MG/0.4ML ~~LOC~~ SOLN
40.0000 mg | SUBCUTANEOUS | Status: DC
  Administered 2017-08-25 – 2017-08-26 (×2): 40 mg via SUBCUTANEOUS
  Filled 2017-08-25 (×3): qty 0.4

## 2017-08-25 NOTE — Progress Notes (Signed)
Occupational Therapy Treatment Patient Details Name: Marcus [name] MRN: [id number] DOB: 02/04/1966 Today's Date: 08/25/2017    History of present illness Marcus HawthorneByron T [name] is a 52 y.o. male presenting with worsening SOB. 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   Had 1.4 L fluid removed from R lung via thoracentesis.  Dx w/ HCAP. Now with C. diff infection.    OT comments  Pt motivated to participate in therapy session today. He was limited by increased weakness as he has been unable to get OOB since onset of C-diff. Pt currently requiring max assist +2 to transfer to recliner with RW this session. However, once seated in recliner and set-up at sink, pt was able to complete oral care tasks and open a variety of containers to challenge fine motor coordination skills. He was also able to complete self-feeding tasks with utensils without built-up handles this session demonstrating much improved functional use of hands. Continue to feel that pt would benefit from CIR level therapies although note unable to obtain these services. If unable to D/C to CIR, will need short-term SNF placement post-acute D/C. Will continue to follow while admitted.   Follow Up Recommendations  CIR;Supervision/Assistance - 24 hour    Equipment Recommendations  Other (comment)(defer to next venue of care)    Recommendations for Other Services      Precautions / Restrictions Precautions Precautions: Fall Restrictions Weight Bearing Restrictions: No       Mobility Bed Mobility Overal bed mobility: Needs Assistance Bed Mobility: Supine to Sit     Supine to sit: Mod assist     General bed mobility comments: +rail, verbal cues for sequencing, use of bed pad to scoot to EOB  Transfers Overall transfer level: Needs assistance Equipment used: Rolling walker (2 wheeled) Transfers: Sit to/from [employer]GI CorporationStand;Stand Pivot Transfers Sit to  Stand: Mod assist;+2 physical assistance Stand pivot transfers: Mod assist;+2 physical assistance       General transfer comment: Sit to stand x 3 trials. Pt unable to attain full upright stance, performed pivot transfer in flexed position. Verbal cues for sequencing increased time to complete.     Balance Overall balance assessment: Needs assistance Sitting-balance support: No upper extremity supported;Feet supported Sitting balance-Leahy Scale: Fair Sitting balance - Comments: can maintain sitting balance with UE assist   Standing balance support: Bilateral upper extremity supported Standing balance-Leahy Scale: Zero Standing balance comment: significant assist from therapists and RW to maintain stance                           ADL either performed or assessed with clinical judgement   ADL Overall ADL's : Needs assistance/impaired Eating/Feeding: Set up;Sitting Eating/Feeding Details (indicate cue type and reason): Set-up to open salt and pepper containers.  Grooming: Oral care;Supervision/safety;Sitting Grooming Details (indicate cue type and reason): Difficulty coordinating fine motor skills to open toothpaste containers. Unable to tolerate standing for grooming tasks this session. Was able to sit at sink to complete oral care tasks.                  Toilet Transfer: Designer, industrial/producttand-pivot;Maximal assistance Toilet Transfer Details (indicate cue type and reason): Simulated from bed to chair.          Functional mobility during ADLs: Maximal assistance;+2 for physical assistance(stand-pivot only) General ADL Comments: Pt reports feeling much better once seated in chair and his lunch was delivered warm  today. Pt very thankful for OT and PT coming in and working with him.      Vision   Additional Comments: Will continue to assess   Perception     Praxis      Cognition Arousal/Alertness: Awake/alert Behavior During Therapy: Flat affect Overall Cognitive Status:  No family/caregiver present to determine baseline cognitive functioning Area of Impairment: Problem solving;Awareness                   Current Attention Level: Selective   Following Commands: Follows one step commands with increased time   Awareness: Emergent Problem Solving: Slow processing General Comments: Increased time for processing. Oriented to situation and able to follow commands approrpiately.         Exercises     Shoulder Instructions       General Comments VSS    Pertinent Vitals/ Pain       Pain Assessment: Faces Faces Pain Scale: Hurts even more Pain Location: L knee and back Pain Descriptors / Indicators: Grimacing;Guarding Pain Intervention(s): Limited activity within patient's tolerance;Monitored during session;Repositioned  Home Living                                          Prior Functioning/Environment              Frequency  Min 2X/week        Progress Toward Goals  OT Goals(current goals can now be found in the care plan section)  Progress towards OT goals: Progressing toward goals  Acute Rehab OT Goals Patient Stated Goal: get stronger OT Goal Formulation: With patient Time For Goal Achievement: 08/26/17 Potential to Achieve Goals: Good  Plan Discharge plan needs to be updated    Co-evaluation    PT/OT/SLP Co-Evaluation/Treatment: Yes Reason for Co-Treatment: For patient/therapist safety;Complexity of the patient's impairments (multi-system involvement) PT goals addressed during session: Mobility/safety with mobility OT goals addressed during session: ADL's and self-care      AM-PAC PT "6 Clicks" Daily Activity     Outcome Measure   Help from another person eating meals?: A Lot Help from another person taking care of personal grooming?: A Lot Help from another person toileting, which includes using toliet, bedpan, or urinal?: A Lot Help from another person bathing (including washing, rinsing,  drying)?: Total Help from another person to put on and taking off regular upper body clothing?: Total Help from another person to put on and taking off regular lower body clothing?: A Lot 6 Click Score: 10    End of Session Equipment Utilized During Treatment: Gait belt;Rolling walker  OT Visit Diagnosis: Other abnormalities of gait and mobility (R26.89);Other symptoms and signs involving cognitive function;Pain Pain - Right/Left: Left Pain - part of body: Knee(back)   Activity Tolerance Patient tolerated treatment well   Patient Left with call bell/phone within reach;in chair;with family/visitor present;Other (comment)   Nurse Communication Mobility status        Time: 1610-9604 OT Time Calculation (min): 27 min  Charges: OT General Charges $OT Visit: 1 Visit OT Treatments $Self Care/Home Management : 8-22 mins  Doristine Section, MS OTR/L  Pager: [phone]    Laqueena Hinchey A Gustabo Gordillo 08/25/2017, 1:25 PM

## 2017-08-25 NOTE — Progress Notes (Signed)
[address]       [place] [id number]             [phone]     CARDIOTHORACIC SURGERY PROGRESS NOTE  4 Days Post-Op  S/P Procedure(s) (LRB): EXTRACTION OF TOOTH #'S 1,2,3,5,6,10,11,14,15,20-29 WITH ALVEOLOPLASTY, MAXILLARY LEFT BUCCAL EXOSTOSES REDUCTIONS AND BILATERAL MANDIBULAR TORI REDUCTIONS (N/A)  Subjective: Reports feeling a little better. Diarrhea improving.  Still some abdominal discomfort.  No improvement in back pain.  Reports feeling very weak "C diff took it out of me".  Denies and SOB  Objective: Vital signs in last 24 hours: Temp:  [98.2 F (36.8 C)-98.9 F (37.2 C)] 98.2 F (36.8 C) (03/25 0500) Pulse Rate:  [88-98] 88 (03/25 0500) Cardiac Rhythm: Normal sinus rhythm (03/24 1930) Resp:  [18-24] 18 (03/25 0500) BP: (85-104)/(57-70) 102/66 (03/25 0500) SpO2:  [90 %-98 %] 97 % (03/25 0500) Weight:  [126 lb 9.6 oz (57.4 kg)] 126 lb 9.6 oz (57.4 kg) (03/25 0500)  Physical Exam:  Rhythm:   sinus  Breath sounds: Diminished right base, otherwise clear  Heart sounds:  Tachy w/ RRR and holosystolic murmur  Incisions:  n/a  Abdomen:  Soft, non-distended, minimally tender  Extremities:  Warm, well-perfused   Intake/Output from previous day: 03/24 0701 - 03/25 0700 In: 238 [P.O.:238] Out: 950 [Urine:950] Intake/Output this shift: No intake/output data recorded.  Lab Results: Recent Labs    08/24/17 0400 08/25/17 0428  WBC 15.6* 13.7*  HGB 8.4* 8.7*  HCT 26.4* 27.3*  PLT 336 328   BMET:  Recent Labs    08/24/17 0400 08/25/17 0428  NA 131* 127*  K 3.6 4.0  CL 95* 92*  CO2 27 26  GLUCOSE 105* 92  BUN 16 14  CREATININE 0.89 0.86  CALCIUM 8.0* 8.0*    CBG (last 3)  No results for input(s): GLUCAP in the last 72 hours. PT/INR:  No results for input(s): LABPROT, INR in the last 72 hours.  CXR:  CHEST - 2 VIEW  COMPARISON:  08/14/2017 the  FINDINGS: Moderate right pleural effusion is similar to prior. Tiny  left pleural effusion noted. Airspace opacity in the right base has decreased in the interval. Cardiopericardial silhouette is at upper limits of normal for size. The visualized bony structures of the thorax are intact. Telemetry leads overlie the chest.  IMPRESSION: Interval decrease and airspace disease at the right base with similar appearance of moderate right and small left pleural effusions.   Electronically Signed   By: Kennith CenterEric  Mansell M.D.   On: 08/23/2017 16:41   Assessment/Plan: S/P Procedure(s) (LRB): EXTRACTION OF TOOTH #'S 1,2,3,5,6,10,11,14,15,20-29 WITH ALVEOLOPLASTY, MAXILLARY LEFT BUCCAL EXOSTOSES REDUCTIONS AND BILATERAL MANDIBULAR TORI REDUCTIONS (N/A)  MSSA bacterial endocarditis involving the mitral valve w/ numerous complications including septic embolization and acute diastolic CHF - improving on medical therapy.  No signs of sepsis, uncontrolled infection and no definite remaining vegetations on most recent TEE.  Remains very stable from cardiac standpoint.  Acute CHF improved with medical therapy and currently no SOB, weight stable.  Moderate right pleural effusion persists.  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  I expect that he is still at least 2-3 weeks away from being ready  for surgery.  Will continue to follow intermittently   I spent in excess of 15 minutes during the conduct of this hospital encounter and >50% of this time involved direct face-to-face encounter with the patient for counseling and/or coordination of their care.   Purcell Nails, MD 08/25/2017 8:19 AM

## 2017-08-25 NOTE — Progress Notes (Signed)
Asked to reevaluate pt for admission to inpt rehab. Pt needs long term rehab and care to prepare for possible surgery. Due to his lack of insurance coverage, he has only one SNF offer [facility]. We are short term rehab and pt needs prolonged rehab recovery. Pt not a candidate to admit to inpt rehab. I contacted Dr. Darin EngelsAbraham to discuss our previous recommendations. SNF continues to be recommended. [phone]

## 2017-08-25 NOTE — Progress Notes (Signed)
Family Medicine Teaching Service Daily Progress Note Intern Pager: [phone]  Patient name: Marcus [name] Medical record number: [id number] Date of birth: [birth year] Age: 52 y.o. Gender: male  Primary Care Provider: Patient, No Pcp Per Consultants: CCM, ID, Cardiology, CVTS, Dental  Code Status: Full   Pt Overview and Major Events to Date:  3/09: Admit forsepsis thought to be secondary to HAP and new onsetHF,cultures obtained, broad-spectrum antibiotics initiated 3/10: PCCM consulted,tolerating BiPAP 3/10: thoracentesis 3/14: TEE with severe MR, CVTS consulted  3/20: C diff positive - oral vanc startred 3/21: full dental extraction  Assessment and Plan: Marcus [name] a 52 y.o.malepresenting with worsening SOB. 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  Endocarditis of mitral valve h/oembolic strokeand metabolic encephalopathy new onset heart failure Patient does have signs concerning for new onset heart failure 2/2 vavular dysfunction.Leukocytosisimproved today at 13.7 from 15.6. -cardiology consulted, appreciate recommendations:can consider low dose ASA -daily weight -strict Is and Os -CVTS consulted, appreciate recommendations: not candidate for surgery until colitis is completely resolved  -ID following: keep on ancef pending CVTS sx (end date 09/24/17) -Dentistry consulted,appreciate recommendations: s/p full extraction3/21 -continue ancef (3/15-)  Osteomyelitis MRI on spine on 08/19/17 showing T7-T8 discitis-osteomyelitis without abscess and C6-7 suspicious for discitis-osteomyelitis without abscess. Mild spinal stenosis of T7-8 also seen.  -ID following, appreciate recommendations:continue abx as described above, no MRI needed  Anemia Chronic, stable.Current Hgb8.7. Appears to be chronically low with baseline of 8-9. Normocytic  -continue to monitor with daily  CBC  Left knee edema h/o left MSSA septic arthritis Presented with lower extremity edema. Does have recent septic arthritis of the knee.DVT r/o with LE dopplers. -continue to monitor   Hepatitis C genotype 2B Noted during previous hospitalization. No prior treatment. Recent viral load 224K.LFTsstable.  -ID consulted, appreciate recommendations: recommended outpatient tx   Compression deformities of mid to lower thoracic spine Patientreports history of fall back in January 2019he continues to have persistent low back pain since discharge. Patient reports use of OxyContin and oxycodone for pain control though no medications per chart review. PT recommending SNF.  -continuepercocet 5-325 q6H scheduled, wean as tolerated -MRI spineunder general anesthesia  Polysubstance abuse UDS positive for barbiturates and opiates on presentation. Also has history of cocaine back in January 2019. Has known endocarditis likely from IV drug use though patient reports no recentillicit drug use.  -Continue onCIWA : 0>3>2 overnight -Daily folate and thiamine -monitor COWS scorere-ordered -percocet 5 Q6, can plan to wean as tolerated  Hyponatremia Improving. Current Nastable/low at 127.Likely 2/2 poor oral intake as patient has been NPO on and off for several days and likely had poor oral intake prior to admission. -monitor on daily BMP -encourage POintake  Hypokalemia-resolved K of4.0 -replete with KDur as needed -monitor on daily BMP  Protein calorie malnutrition  Albumin of 2.3 on 3/16. Likely 2/2 poor oral intake -consult nutrition, appreciate recommendations  C-diff -enteric precautions -ID consulted, appreciate recommendations: continue dificid, can start immodium if having 3-4 watery stools   Disposition Will likely need SNF due to need for longer term care and PICC line management. -CSW consulted appreciate recommendations: bed offered at [facility] but patient refusing due to negative experience during last admission there  FEN/GI:soft diet  JYN:[id number]   Disposition: will need SNF placement  Subjective:  Patient today states he feels "worn out". Denies CP or SOB. Reports no BM since yesterday morning. Endorses back pain.  Objective: Temp:  [98.2 F (36.8 C)-98.9 F (37.2 C)] 98.2 F (36.8 C) (03/25 0500) Pulse Rate:  [88-98] 88 (03/25 0500) Resp:  [18-24] 18 (03/25 0500) BP: (85-104)/(57-70) 102/66 (03/25 0500) SpO2:  [90 %-98 %] 97 % (03/25 0500) Weight:  [126 lb 9.6 oz (57.4 kg)] 126 lb 9.6 oz (57.4 kg) (03/25 0500) Physical Exam: General: awake and alert, laying in bed, thin  Cardiovascular: RRR, grade 2 murmur  Respiratory: CTAB, no wheezes, rales, or rhonchi  Abdomen: soft, diffuse tenderness, non distended, bowel sounds appreciated  Extremities: non tender, no edema   Laboratory: Recent Labs  Lab 08/23/17 0635 08/24/17 0400 08/25/17 0428  WBC 17.4* 15.6* 13.7*  HGB 8.3* 8.4* 8.7*  HCT 26.6* 26.4* 27.3*  PLT 291 336 328   Recent Labs  Lab 08/23/17 0635 08/24/17 0400 08/25/17 0428  NA 128* 131* 127*  K 3.5 3.6 4.0  CL 93* 95* 92*  CO2 25 27 26   BUN 18 16 14   CREATININE 0.88 0.89 0.86  CALCIUM 7.7* 8.0* 8.0*  GLUCOSE 107* 105* 92    Imaging/Diagnostic Tests: Dg Orthopantogram  Result Date: 08/15/2017 CLINICAL DATA:  Poor dentition.  Cardiac workup. EXAM: ORTHOPANTOGRAM/PANORAMIC COMPARISON:  None. FINDINGS: There are numerous missing maxillary and mandibular teeth, and multiple remaining maxillary teeth appear broken. There is mild periapical lucency associated with a left maxillary premolar tooth. IMPRESSION: Poor dentition with multiple missing and broken teeth. Mild periapical lucency associated with a left maxillary premolar tooth, abscess not excluded. Electronically Signed   By: Sebastian Ache M.D.   On: 08/15/2017 09:12   Dg Chest 2 View  Result Date: 08/23/2017 CLINICAL DATA:   Bilateral pleural effusions EXAM: CHEST - 2 VIEW COMPARISON:  08/14/2017 the FINDINGS: Moderate right pleural effusion is similar to prior. Tiny left pleural effusion noted. Airspace opacity in the right base has decreased in the interval. Cardiopericardial silhouette is at upper limits of normal for size. The visualized bony structures of the thorax are intact. Telemetry leads overlie the chest. IMPRESSION: Interval decrease and airspace disease at the right base with similar appearance of moderate right and small left pleural effusions. Electronically Signed   By: Kennith Center M.D.   On: 08/23/2017 16:41   Dg Chest 2 View  Result Date: 08/14/2017 CLINICAL DATA:  Followup pneumonia. EXAM: CHEST - 2 VIEW COMPARISON:  08/10/2016 and older studies. FINDINGS: Moderate right and small left pleural effusions. There is associated lung base opacity, greater on the right, which is consistent with atelectasis, pneumonia or a combination. The patchy airspace opacity noted along the perihilar left lung on the prior study has improved. Airspace opacity in the right mid to lower lung appears somewhat less confluent but is unchanged in extent. No new lung abnormalities.  No pneumothorax. IMPRESSION: 1. Mild improvement in lung consolidation compared to the prior exam. 2. Persistent moderate right and small left pleural effusions. No new abnormalities. Electronically Signed   By: Amie Portland M.D.   On: 08/14/2017 13:16   Dg Chest 2 View  Addendum Date: 08/09/2017   ADDENDUM REPORT: 08/09/2017 15:41 ADDENDUM: After further review, there are compression deformities of 2 adjacent vertebral bodies in the mid to lower thoracic spine, of uncertain age but new compared to an older chest x-ray 02/15/2005. Would consider further characterization with CT. These results were called by telephone at the time of interpretation on 08/09/2017 at 3:40 pm to Dr. Doug Sou , who verbally acknowledged these results. Electronically Signed    By:  Bary RichardStan  Maynard M.D.   On: 08/09/2017 15:41   Result Date: 08/09/2017 CLINICAL DATA:  Shortness of breath for 2 days. Bacterial meningitis diagnosed at the beginning of January, than recently diagnosed with pneumonia. History of TIA in January with left-sided weakness. Former smoker. EXAM: CHEST - 2 VIEW COMPARISON:  Chest x-ray dated 06/08/2017. FINDINGS: New dense opacity at the right lung base, likely a combination of consolidation and pleural effusion. Pleural effusion component is at least moderate in size. Patchy opacities are seen within the left perihilar and lower lung zones, favor edema. Additional opacity at the left lung base is likely a combination of atelectasis and small pleural effusion. Heart size and mediastinal contours are stable. Osseous structures about the chest are unremarkable. IMPRESSION: 1. New dense opacity at the right lung base, likely a combination of pneumonia or atelectasis and pleural effusion. The pleural effusion component is at least moderate in size. Would consider chest CT for further characterization. 2. Additional patchy ill-defined opacities within the left mid and lower lung, most likely edema, pneumonia considered less likely. 3. Probable mild atelectasis and/or small pleural effusion at the left lung base. Electronically Signed: By: Bary RichardStan  Maynard M.D. On: 08/09/2017 15:29   Ct Angio Chest Pe W And/or Wo Contrast  Result Date: 08/09/2017 CLINICAL DATA:  SOB x2 days. Pt diagnosed with bacterial meningitis at the beginning of January, then recently diagnosed with pneumonia. Pt also has hx of TIA beginning of January that left him with left sided weakness. Patient had left knee surgery x 6 weeks ago for sepsis. EXAM: CT ANGIOGRAPHY CHEST WITH CONTRAST TECHNIQUE: Multidetector CT imaging of the chest was performed using the standard protocol during bolus administration of intravenous contrast. Multiplanar CT image reconstructions and MIPs were obtained to evaluate the  vascular anatomy. CONTRAST:  100mL ISOVUE-370 IOPAMIDOL (ISOVUE-370) INJECTION 76% COMPARISON:  Current chest radiographs and prior studies. FINDINGS: Cardiovascular: Satisfactory opacification of the pulmonary arteries to the segmental level. No evidence of pulmonary embolism. Heart is normal in size and configuration. No pericardial effusion. No coronary artery calcifications. The great vessels normal in caliber. No aortic atherosclerosis. No dissection. Mediastinum/Nodes: No neck base or axillary masses or pathologically enlarged lymph nodes. No mediastinal or hilar masses or discrete enlarged lymph nodes. The trachea is patent and normal in caliber. Esophagus is unremarkable. Lungs/Pleura: Large right and small left pleural effusions. Complete atelectasis of the right middle lobe and near complete atelectasis of the right lower lobe. Small area of consolidation in the anterior inferior right upper lobe. Patchy consolidation is noted in a peribronchovascular distribution in the left upper lobe and, to lesser degree, left lower lobe. There is dependent opacity also in the left lower lobe consistent with atelectasis. No pneumothorax. Upper Abdomen: No acute abnormality. Musculoskeletal: There is marked narrowing of the T6-T7 disc space with resorption/irregularity of the lower endplate of T6 in upper endplate of T7, with mild loss of height of these vertebra. This leads to a mild focal kyphosis. This is new since a chest radiograph dated 02/15/2005. A subtle lucency across the T6 spinous process posteriorly suggests a previous fracture. All remaining vertebral bodies are normal in height. No osteoblastic or osteolytic lesions. Review of the MIP images confirms the above findings. IMPRESSION: 1. Patchy areas of consolidation in both lungs consistent with multifocal pneumonia. 2. Complete atelectasis of the right middle lobe and near complete atelectasis of the right lower lobe. Mild dependent atelectasis in the left  lower lobe. 3. Large right and small  left pleural effusions. 4. Marked loss of the disc space at T6-C7 with irregular resorption of endplates leading to loss of vertebral body height of T6 and T7 and a focal kyphosis. This may reflect the sequelae of an old fracture. It could be due to previous discitis/osteomyelitis. Electronically Signed   By: Amie Portland M.D.   On: 08/09/2017 18:44   Mr Brain Wo Contrast  Result Date: 08/14/2017 CLINICAL DATA:  52 y/o  M; stroke follow-up. EXAM: MRI HEAD WITHOUT CONTRAST TECHNIQUE: Axial DWI sequence was acquired. Patient combative, unable to continue examination. COMPARISON:  06/09/2017 MRI head.  06/08/2017 CT head. FINDINGS: Low B value DWI sequence demonstrates areas of T2 hyperintensity in the right greater than left frontal, parietal, and occipital lobes as well as splenium of corpus callosum and cerebellum corresponding to infarctions on prior MRI. The high B value sequence is motion degraded, insufficient to assess for reduced diffusion and acute stroke. IMPRESSION: Severe motion degraded DWI sequence insufficient to assess for acute stroke. Repeat imaging is recommended when patient is able to hold still and follow commands. Electronically Signed   By: Mitzi Hansen M.D.   On: 08/14/2017 21:38   Mr Laqueta Jean ZO Contrast  Result Date: 08/19/2017 CLINICAL DATA:  Hospitalized in January with bacteremia, mitral valve endocarditis, CNS septic emboli, and thoracic spine discitis. Persistent back pain. EXAM: MRI HEAD WITHOUT AND WITH CONTRAST MRI THORACIC SPINE WITHOUT AND WITH CONTRAST MRI LUMBAR SPINE WITHOUT AND WITH CONTRAST TECHNIQUE: Multiplanar, multiecho pulse sequences of the brain and surrounding structures, and cervical spine, to include the craniocervical junction and cervicothoracic junction, were obtained without and with intravenous contrast. Multiplanar and multiecho pulse sequences of the thoracic and lumbar spine were obtained without and  with intravenous contrast. CONTRAST:  9mL MULTIHANCE GADOBENATE DIMEGLUMINE 529 MG/ML IV SOLN COMPARISON:  Brain MRI 06/09/2017. CTA chest, abdomen, and pelvis 08/14/2017. Lumbar spine CT 12/30/2015. FINDINGS: MRI HEAD FINDINGS Brain: Embolic infarcts throughout the brain on the prior MRI demonstrate interval evolution. Associated diffusion abnormality has decreased though not completely resolved in some of the areas of infarction, and there is developing encephalomalacia. Associated chronic blood products are present with the majority of the infarcts. Multiple infarcts demonstrate enhancement. No definite restricted diffusion is seen to clearly indicate acute infarction. There are a few subcentimeter infarcts in the left centrum semiovale which are new from the prior MRI and demonstrate mild trace diffusion abnormality without reduced ADC, consistent with interval subacute infarcts. No intracranial mass, midline shift, or extra-axial fluid collection is seen. There is mild cerebral atrophy. No abnormal meningeal enhancement is identified. Vascular: Major intracranial vascular flow voids are preserved. Skull and upper cervical spine: Unremarkable bone marrow signal. Sinuses/Orbits: Unremarkable orbits. Clear paranasal sinuses. Trace left mastoid fluid. Other: None. MRI THORACIC SPINE FINDINGS Spinal numbering is performed by counting down from the craniocervical junction. This results in different numbering than what was reported on the prior CTA due to transitional lumbosacral anatomy. Some sequences are up to moderately motion degraded. Alignment: Slight focal kyphosis at T7-8.  No listhesis. Vertebrae: Disc and endplate destruction at T7-8 as seen on prior CT with diffuse enhancement throughout the residual disc space and marrow of both T7 and T8 vertebral bodies extending into the pedicles. Minimal dorsal epidural enhancement at T7-8 without evidence of epidural abscess. Partially visualized marrow edema and  enhancement in the C6 and C7 vertebral bodies with endplate irregularity, disc space narrowing, and enhancement anteriorly in the disc space. No evidence of C6-7  epidural abscess on limited sagittal imaging. Very mild endplate edema and enhancement at T11-12 favored to be degenerative given lack of disc abnormality or endplate erosion to suggest infection. Background diminished T1 bone marrow signal intensity diffusely is likely related to patient's known anemia. 1.4 cm enhancing lesion in the posterior elements on the right at T3 is consistent with an hemangioma. A smaller atypical hemangioma is also suspected in the right T2 pedicle. Cord:  Normal signal. Paraspinal and other soft tissues: Paravertebral soft tissue edema/phlegmon from T6-T8. Interspinous soft tissue edema and enhancement at T7-8. No paraspinal abscess identified. Partially visualized prevertebral inflammation in the lower cervical spine. Moderate right and small left pleural effusions with associated compressive atelectasis on the right. Small volume fluid in the esophagus. Partially visualized endotracheal tube and tracheal secretions. Disc levels: Moderate disc space narrowing, disc bulging, and endplate spurring at C7-T1 result in moderate right and mild-to-moderate left neural foraminal stenosis and at most mild spinal stenosis. Broad-based posterior disc osteophyte complex and facet hypertrophy at T7-8 result in mild spinal stenosis without cord compression. Mild disc bulging and scattered tiny disc protrusions elsewhere in the thoracic spine do not result in spinal stenosis. MRI LUMBAR SPINE FINDINGS Some sequences are mildly motion degraded. Segmentation: Transitional lumbosacral anatomy with a lumbarized S1. Alignment: Facet mediated anterolisthesis of L5 on S1 measuring 4 mm. Vertebrae: Diffusely diminished T1 bone marrow signal intensity likely related to anemia. Predominantly type 2 degenerative endplate changes at S1-2. Mild type 1  changes at L3-4. No evidence of infectious discitis-osteomyelitis. No fracture. Conus medullaris: Extends to the L2 level and appears normal. Paraspinal and other soft tissues: Mild nonspecific posterior paraspinal muscle edema bilaterally in the mid and lower lumbar spine. No fluid collection. Disc levels: L1-2: Negative. L2-3: Minimal disc bulging without stenosis. L3-4: Mild disc space narrowing. Mild disc bulging and mild facet and ligamentum flavum hypertrophy result in mild bilateral lateral recess stenosis and mild left neural foraminal stenosis without significant spinal stenosis. L4-5: Mild disc space narrowing. Mild disc bulging asymmetric to the right and moderate right and mild left facet hypertrophy result in mild right lateral recess stenosis and mild right neural foraminal stenosis without significant spinal stenosis. L5-S1: Anterolisthesis with disc uncovering and severe facet hypertrophy result in mild spinal stenosis, mild-to-moderate bilateral lateral recess stenosis, and mild-to-moderate bilateral neural foraminal stenosis. S1-2: Transitional level with disc space narrowing. Mild disc bulging and moderate facet arthrosis result in mild left neural foraminal stenosis without spinal stenosis. IMPRESSION: 1. 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 2. Interval tiny subacute infarcts in the left centrum semiovale. No acute infarct. 3. Transitional lumbosacral anatomy as above. 4. 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 No epidural or paraspinal abscess. 5. Partially visualized disc and endplate enhancement at C6-7 suspicious for discitis-osteomyelitis with prevertebral inflammation. No evidence of epidural abscess though this was incompletely imaged. 6. Mild spinal stenosis at T7-8. 7. No evidence of discitis-osteomyelitis in the lumbar spine. 8. Nonspecific bilateral lumbar posterior paraspinal muscle edema without abscess. 9. Lumbar  disc and facet degeneration most notable at L5-S1 where there is grade 1 anterolisthesis, mild spinal stenosis, and mild-to-moderate lateral recess and neural foraminal stenosis. 10. Moderate right and small left pleural effusions. Electronically Signed   By: Sebastian Ache M.D.   On: 08/19/2017 15:55   Mr Thoracic Spine W Wo Contrast  Result Date: 08/19/2017 CLINICAL DATA:  Hospitalized in January with bacteremia, mitral valve endocarditis, CNS septic emboli, and thoracic spine discitis. Persistent  back pain. EXAM: MRI HEAD WITHOUT AND WITH CONTRAST MRI THORACIC SPINE WITHOUT AND WITH CONTRAST MRI LUMBAR SPINE WITHOUT AND WITH CONTRAST TECHNIQUE: Multiplanar, multiecho pulse sequences of the brain and surrounding structures, and cervical spine, to include the craniocervical junction and cervicothoracic junction, were obtained without and with intravenous contrast. Multiplanar and multiecho pulse sequences of the thoracic and lumbar spine were obtained without and with intravenous contrast. CONTRAST:  9mL MULTIHANCE GADOBENATE DIMEGLUMINE 529 MG/ML IV SOLN COMPARISON:  Brain MRI 06/09/2017. CTA chest, abdomen, and pelvis 08/14/2017. Lumbar spine CT 12/30/2015. FINDINGS: MRI HEAD FINDINGS Brain: Embolic infarcts throughout the brain on the prior MRI demonstrate interval evolution. Associated diffusion abnormality has decreased though not completely resolved in some of the areas of infarction, and there is developing encephalomalacia. Associated chronic blood products are present with the majority of the infarcts. Multiple infarcts demonstrate enhancement. No definite restricted diffusion is seen to clearly indicate acute infarction. There are a few subcentimeter infarcts in the left centrum semiovale which are new from the prior MRI and demonstrate mild trace diffusion abnormality without reduced ADC, consistent with interval subacute infarcts. No intracranial mass, midline shift, or extra-axial fluid collection is  seen. There is mild cerebral atrophy. No abnormal meningeal enhancement is identified. Vascular: Major intracranial vascular flow voids are preserved. Skull and upper cervical spine: Unremarkable bone marrow signal. Sinuses/Orbits: Unremarkable orbits. Clear paranasal sinuses. Trace left mastoid fluid. Other: None. MRI THORACIC SPINE FINDINGS Spinal numbering is performed by counting down from the craniocervical junction. This results in different numbering than what was reported on the prior CTA due to transitional lumbosacral anatomy. Some sequences are up to moderately motion degraded. Alignment: Slight focal kyphosis at T7-8.  No listhesis. Vertebrae: Disc and endplate destruction at T7-8 as seen on prior CT with diffuse enhancement throughout the residual disc space and marrow of both T7 and T8 vertebral bodies extending into the pedicles. Minimal dorsal epidural enhancement at T7-8 without evidence of epidural abscess. Partially visualized marrow edema and enhancement in the C6 and C7 vertebral bodies with endplate irregularity, disc space narrowing, and enhancement anteriorly in the disc space. No evidence of C6-7 epidural abscess on limited sagittal imaging. Very mild endplate edema and enhancement at T11-12 favored to be degenerative given lack of disc abnormality or endplate erosion to suggest infection. Background diminished T1 bone marrow signal intensity diffusely is likely related to patient's known anemia. 1.4 cm enhancing lesion in the posterior elements on the right at T3 is consistent with an hemangioma. A smaller atypical hemangioma is also suspected in the right T2 pedicle. Cord:  Normal signal. Paraspinal and other soft tissues: Paravertebral soft tissue edema/phlegmon from T6-T8. Interspinous soft tissue edema and enhancement at T7-8. No paraspinal abscess identified. Partially visualized prevertebral inflammation in the lower cervical spine. Moderate right and small left pleural effusions with  associated compressive atelectasis on the right. Small volume fluid in the esophagus. Partially visualized endotracheal tube and tracheal secretions. Disc levels: Moderate disc space narrowing, disc bulging, and endplate spurring at C7-T1 result in moderate right and mild-to-moderate left neural foraminal stenosis and at most mild spinal stenosis. Broad-based posterior disc osteophyte complex and facet hypertrophy at T7-8 result in mild spinal stenosis without cord compression. Mild disc bulging and scattered tiny disc protrusions elsewhere in the thoracic spine do not result in spinal stenosis. MRI LUMBAR SPINE FINDINGS Some sequences are mildly motion degraded. Segmentation: Transitional lumbosacral anatomy with a lumbarized S1. Alignment: Facet mediated anterolisthesis of L5 on S1 measuring 4  mm. Vertebrae: Diffusely diminished T1 bone marrow signal intensity likely related to anemia. Predominantly type 2 degenerative endplate changes at S1-2. Mild type 1 changes at L3-4. No evidence of infectious discitis-osteomyelitis. No fracture. Conus medullaris: Extends to the L2 level and appears normal. Paraspinal and other soft tissues: Mild nonspecific posterior paraspinal muscle edema bilaterally in the mid and lower lumbar spine. No fluid collection. Disc levels: L1-2: Negative. L2-3: Minimal disc bulging without stenosis. L3-4: Mild disc space narrowing. Mild disc bulging and mild facet and ligamentum flavum hypertrophy result in mild bilateral lateral recess stenosis and mild left neural foraminal stenosis without significant spinal stenosis. L4-5: Mild disc space narrowing. Mild disc bulging asymmetric to the right and moderate right and mild left facet hypertrophy result in mild right lateral recess stenosis and mild right neural foraminal stenosis without significant spinal stenosis. L5-S1: Anterolisthesis with disc uncovering and severe facet hypertrophy result in mild spinal stenosis, mild-to-moderate bilateral  lateral recess stenosis, and mild-to-moderate bilateral neural foraminal stenosis. S1-2: Transitional level with disc space narrowing. Mild disc bulging and moderate facet arthrosis result in mild left neural foraminal stenosis without spinal stenosis. IMPRESSION: 1. 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 2. Interval tiny subacute infarcts in the left centrum semiovale. No acute infarct. 3. Transitional lumbosacral anatomy as above. 4. 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 No epidural or paraspinal abscess. 5. Partially visualized disc and endplate enhancement at C6-7 suspicious for discitis-osteomyelitis with prevertebral inflammation. No evidence of epidural abscess though this was incompletely imaged. 6. Mild spinal stenosis at T7-8. 7. No evidence of discitis-osteomyelitis in the lumbar spine. 8. Nonspecific bilateral lumbar posterior paraspinal muscle edema without abscess. 9. Lumbar disc and facet degeneration most notable at L5-S1 where there is grade 1 anterolisthesis, mild spinal stenosis, and mild-to-moderate lateral recess and neural foraminal stenosis. 10. Moderate right and small left pleural effusions. Electronically Signed   By: Sebastian Ache M.D.   On: 08/19/2017 15:55   Mr Lumbar Spine W Wo Contrast  Result Date: 08/19/2017 CLINICAL DATA:  Hospitalized in January with bacteremia, mitral valve endocarditis, CNS septic emboli, and thoracic spine discitis. Persistent back pain. EXAM: MRI HEAD WITHOUT AND WITH CONTRAST MRI THORACIC SPINE WITHOUT AND WITH CONTRAST MRI LUMBAR SPINE WITHOUT AND WITH CONTRAST TECHNIQUE: Multiplanar, multiecho pulse sequences of the brain and surrounding structures, and cervical spine, to include the craniocervical junction and cervicothoracic junction, were obtained without and with intravenous contrast. Multiplanar and multiecho pulse sequences of the thoracic and lumbar spine were obtained without and  with intravenous contrast. CONTRAST:  9mL MULTIHANCE GADOBENATE DIMEGLUMINE 529 MG/ML IV SOLN COMPARISON:  Brain MRI 06/09/2017. CTA chest, abdomen, and pelvis 08/14/2017. Lumbar spine CT 12/30/2015. FINDINGS: MRI HEAD FINDINGS Brain: Embolic infarcts throughout the brain on the prior MRI demonstrate interval evolution. Associated diffusion abnormality has decreased though not completely resolved in some of the areas of infarction, and there is developing encephalomalacia. Associated chronic blood products are present with the majority of the infarcts. Multiple infarcts demonstrate enhancement. No definite restricted diffusion is seen to clearly indicate acute infarction. There are a few subcentimeter infarcts in the left centrum semiovale which are new from the prior MRI and demonstrate mild trace diffusion abnormality without reduced ADC, consistent with interval subacute infarcts. No intracranial mass, midline shift, or extra-axial fluid collection is seen. There is mild cerebral atrophy. No abnormal meningeal enhancement is identified. Vascular: Major intracranial vascular flow voids are preserved. Skull and upper cervical spine: Unremarkable bone marrow signal.  Sinuses/Orbits: Unremarkable orbits. Clear paranasal sinuses. Trace left mastoid fluid. Other: None. MRI THORACIC SPINE FINDINGS Spinal numbering is performed by counting down from the craniocervical junction. This results in different numbering than what was reported on the prior CTA due to transitional lumbosacral anatomy. Some sequences are up to moderately motion degraded. Alignment: Slight focal kyphosis at T7-8.  No listhesis. Vertebrae: Disc and endplate destruction at T7-8 as seen on prior CT with diffuse enhancement throughout the residual disc space and marrow of both T7 and T8 vertebral bodies extending into the pedicles. Minimal dorsal epidural enhancement at T7-8 without evidence of epidural abscess. Partially visualized marrow edema and  enhancement in the C6 and C7 vertebral bodies with endplate irregularity, disc space narrowing, and enhancement anteriorly in the disc space. No evidence of C6-7 epidural abscess on limited sagittal imaging. Very mild endplate edema and enhancement at T11-12 favored to be degenerative given lack of disc abnormality or endplate erosion to suggest infection. Background diminished T1 bone marrow signal intensity diffusely is likely related to patient's known anemia. 1.4 cm enhancing lesion in the posterior elements on the right at T3 is consistent with an hemangioma. A smaller atypical hemangioma is also suspected in the right T2 pedicle. Cord:  Normal signal. Paraspinal and other soft tissues: Paravertebral soft tissue edema/phlegmon from T6-T8. Interspinous soft tissue edema and enhancement at T7-8. No paraspinal abscess identified. Partially visualized prevertebral inflammation in the lower cervical spine. Moderate right and small left pleural effusions with associated compressive atelectasis on the right. Small volume fluid in the esophagus. Partially visualized endotracheal tube and tracheal secretions. Disc levels: Moderate disc space narrowing, disc bulging, and endplate spurring at C7-T1 result in moderate right and mild-to-moderate left neural foraminal stenosis and at most mild spinal stenosis. Broad-based posterior disc osteophyte complex and facet hypertrophy at T7-8 result in mild spinal stenosis without cord compression. Mild disc bulging and scattered tiny disc protrusions elsewhere in the thoracic spine do not result in spinal stenosis. MRI LUMBAR SPINE FINDINGS Some sequences are mildly motion degraded. Segmentation: Transitional lumbosacral anatomy with a lumbarized S1. Alignment: Facet mediated anterolisthesis of L5 on S1 measuring 4 mm. Vertebrae: Diffusely diminished T1 bone marrow signal intensity likely related to anemia. Predominantly type 2 degenerative endplate changes at S1-2. Mild type 1  changes at L3-4. No evidence of infectious discitis-osteomyelitis. No fracture. Conus medullaris: Extends to the L2 level and appears normal. Paraspinal and other soft tissues: Mild nonspecific posterior paraspinal muscle edema bilaterally in the mid and lower lumbar spine. No fluid collection. Disc levels: L1-2: Negative. L2-3: Minimal disc bulging without stenosis. L3-4: Mild disc space narrowing. Mild disc bulging and mild facet and ligamentum flavum hypertrophy result in mild bilateral lateral recess stenosis and mild left neural foraminal stenosis without significant spinal stenosis. L4-5: Mild disc space narrowing. Mild disc bulging asymmetric to the right and moderate right and mild left facet hypertrophy result in mild right lateral recess stenosis and mild right neural foraminal stenosis without significant spinal stenosis. L5-S1: Anterolisthesis with disc uncovering and severe facet hypertrophy result in mild spinal stenosis, mild-to-moderate bilateral lateral recess stenosis, and mild-to-moderate bilateral neural foraminal stenosis. S1-2: Transitional level with disc space narrowing. Mild disc bulging and moderate facet arthrosis result in mild left neural foraminal stenosis without spinal stenosis. IMPRESSION: 1. 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 2. Interval tiny subacute infarcts in the left centrum semiovale. No acute infarct. 3. Transitional lumbosacral anatomy as above. 4. 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 No epidural or paraspinal abscess. 5. Partially visualized disc and endplate enhancement at C6-7 suspicious for discitis-osteomyelitis with prevertebral inflammation. No evidence of epidural abscess though this was incompletely imaged. 6. Mild spinal stenosis at T7-8. 7. No evidence of discitis-osteomyelitis in the lumbar spine. 8. Nonspecific bilateral lumbar posterior paraspinal muscle edema without abscess. 9. Lumbar  disc and facet degeneration most notable at L5-S1 where there is grade 1 anterolisthesis, mild spinal stenosis, and mild-to-moderate lateral recess and neural foraminal stenosis. 10. Moderate right and small left pleural effusions. Electronically Signed   By: Sebastian Ache M.D.   On: 08/19/2017 15:55   Dg Chest Port 1 View  Result Date: 08/10/2017 CLINICAL DATA:  Post right thora, 1.4 L removed EXAM: PORTABLE CHEST 1 VIEW COMPARISON:  Chest x-ray dated 08/09/2017. FINDINGS: Some improvement in aeration at the left lung base. Residual opacity is presumably atelectasis or pneumonia. Ill-defined opacities persist throughout the left lung, consistent with the multifocal pneumonia better demonstrated on chest CT of 08/09/2017. Additional persistent atelectasis at the left lung base. No pneumothorax. Heart size and mediastinal contours appear stable. IMPRESSION: 1. Improved aeration at the right lung base status post thoracentesis. Residual opacity is presumably atelectasis or pneumonia. No pneumothorax seen. 2. Stable patchy opacities throughout the left lung, compatible with the multifocal pneumonia demonstrated on yesterday's chest CT. Electronically Signed   By: Bary Richard M.D.   On: 08/10/2017 14:54   Ct Angio Chest/abd/pel For Dissection W And/or W/wo  Result Date: 08/14/2017 CLINICAL DATA:  Septic arterial embolism EXAM: CT ANGIOGRAPHY CHEST, ABDOMEN AND PELVIS TECHNIQUE: Multidetector CT imaging through the chest, abdomen and pelvis was performed using the standard protocol during bolus administration of intravenous contrast. Multiplanar reconstructed images and MIPs were obtained and reviewed to evaluate the vascular anatomy. CONTRAST:  100 cc ISOVUE-370 IOPAMIDOL (ISOVUE-370) INJECTION 76% COMPARISON:  Chest CT 08/09/2017 FINDINGS: CTA CHEST FINDINGS Cardiovascular: No acute pulmonary embolus. Satisfactory opacification of the aorta and pulmonary arteries. Normal size heart without pericardial  effusion. Normal branch pattern of the great vessels off the arch. No coronary arteriosclerosis. No aortic atherosclerosis. No aneurysm. No aortic dissection. Mediastinum/Nodes: No supraclavicular, axillary, mediastinal nor hilar lymphadenopathy. Trachea is patent and normal caliber without intraluminal abnormality. Esophagus is unremarkable. No thyromegaly is noted. Lungs/Pleura: Bilateral moderate-sized pleural effusions slightly smaller on the right with loculated component. Adjacent compressive atelectasis is noted. Patchy airspace opacities in the left upper lobe are redemonstrated, with slight improvement in aeration since prior though the vast majority of the pulmonary opacity still persists. Musculoskeletal: Redemonstration of marked disc space narrowing C6-7 with endplate irregularity of the inferior endplate of T6 superior endplate of T7 with resultant mild focal kyphosis. Review of the MIP images confirms the above findings. CTA ABDOMEN AND PELVIS FINDINGS VASCULAR Aorta: Normal caliber aorta without aneurysm, dissection, vasculitis or significant stenosis. Celiac: Patent without evidence of aneurysm, dissection, vasculitis or significant stenosis. SMA: Patent without evidence of aneurysm, dissection, vasculitis or significant stenosis. Renals: Single renal arteries bilaterally without acute abnormality. No significant stenosis, dissection or evidence of fibromuscular dysplasia. IMA: Patent Inflow: No aneurysm or dissection. Minimal atherosclerotic plaque in the proximal right common iliac and both distal iliac arteries. No significant stenosis. No dissection or aneurysm. Veins: No obvious venous abnormality within the limitations of this arterial phase study. Review of the MIP images confirms the above findings. NON-VASCULAR Hepatobiliary: No focal liver abnormality is seen. No gallstones, gallbladder wall thickening, or biliary dilatation. Pancreas: Unremarkable. No pancreatic ductal dilatation or  surrounding inflammatory changes. Spleen: Heterogeneous enhancement likely due to timing of contrast bolus. No splenomegaly. Adrenals/Urinary Tract: No adrenal no renal masses. No nephrolithiasis nor obstructive uropathy. No hydroureteronephrosis. Unremarkable bladder. Stomach/Bowel: Nondistended stomach with normal small bowel rotation. Mild fluid-filled distention of small bowel loops with slight mural thickening of jejunum may reflect a mild small bowel enteritis. No mechanical source obstruction is seen. Colon is. Normal appearing appendix. Lymphatic: No abdominopelvic adenopathy. Reproductive: Normal size prostate and seminal vesicles. Other: Small amount of fluid in the left inguinal canal. Musculoskeletal: Plate and screw fixation of the left acetabulum. 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 No acute osseous abnormality. There is lower lumbar degenerative facet arthropathy. Minimal anterolisthesis of L4 on L5. Marked disc space narrowing at L5-S1. Review of the MIP images confirms the above findings. IMPRESSION: Chest CT: 1. No acute pulmonary embolus. 2. No aortic aneurysm or dissection. 3. Bilateral moderate-sized pleural effusions slightly smaller on the right with loculated component redemonstrated. Adjacent compressive atelectasis is seen both lower lobes. 4. Relatively unchanged patchy airspace opacity in the left upper lobe only minimally improved in aeration. 5. Redemonstration of focal kyphosis at T6-7 secondary to endplate irregularities and disc space narrowing suspicious for changes of discitis/osteomyelitis. MRI would be the study of choice for further correlation as to acuity. Abdomen and pelvic CT: 1. No aortic aneurysm or dissection. Patent branch vessels without significant stenosis or focal abnormality. 2. Mild fluid-filled distention of small bowel which transmural thickening suspicious for changes of mild enteritis. No mechanical bowel obstruction. Electronically Signed   By:  Tollie Eth M.D.   On: 08/14/2017 21:21   US Thoracentesis Asp Pleural Space W/img Guide  Result Date: 08/10/2017 INDICATION: Respiratory failure. Large right pleural effusion. Request for diagnostic and therapeutic thoracentesis. EXAM: ULTRASOUND GUIDED RIGHT THORACENTESIS MEDICATIONS: None. COMPLICATIONS: None immediate. PROCEDURE: An ultrasound guided thoracentesis was thoroughly discussed with the patient and questions answered. The benefits, risks, alternatives and complications were also discussed. The patient understands and wishes to proceed with the procedure. Written consent was obtained. Ultrasound was performed to localize and mark an adequate pocket of fluid in the right chest. The area was then prepped and draped in the normal sterile fashion. 1% Lidocaine was used for local anesthesia. Under ultrasound guidance a Safe-T-Centesis catheter was introduced. Thoracentesis was performed. The catheter was removed and a dressing applied. FINDINGS: A total of approximately 1.4 L of pink/blood-tinged fluid was removed. Samples were sent to the laboratory as requested by the clinical team. IMPRESSION: Successful ultrasound guided right thoracentesis yielding 1.4 L of pleural fluid. Read by: Brayton El PA-C Electronically Signed   By: Corlis Leak M.D.   On: 08/10/2017 12:55     Oralia Manis, DO 08/25/2017, 9:43 AM PGY-1, [facility] FPTS Intern pager: [phone], text pages welcome

## 2017-08-25 NOTE — Progress Notes (Addendum)
Nutrition Follow-up   DOCUMENTATION CODES:   Severe malnutrition in context of chronic illness, Underweight  INTERVENTION:   Discussed adding appetite stimulant with MD; MD to initiate as medically appropriate.  Obtained MD verbal orders with read back to change soft diet to dysphagia 3; with meal assistance.  Unjury (Chicken) daily; each supplement provides 100kcal and 21g protein.  Continue Magic cup (orange) TID with meals, each supplement provides 290kcals and 9g of protein.  Continue Ensure TID, each supplement provides 350kcals and 20g of protein.  Continue MVI  NUTRITION DIAGNOSIS:   Severe Malnutrition related to chronic illness(polysubstance abuse) as evidenced by severe fat depletion, severe muscle depletion. Ongoing.  GOAL:   Patient will meet greater than or equal to 90% of their needs Progressing.  MONITOR:   PO intake, Supplement acceptance, Skin, I & O's  ASSESSMENT:   52 yo male with PMH of arthritis, chronic back pain, endocarditis, MRSA bacteremia, polysubstance abuse, hepatitis C, mitral regurgitation, septic embolism, and malnutrition, who was admitted on 3/9 with worsening dyspnea, sepsis 2/2 HAP and new onset HF. S/P thoracentesis 3/10, 1.4 L removed.  3/20 C. Diff positive 3/21 - complete tooth extraction. CLD to soft diet at 1733.  RD consulted for nutrition assessment.   Pt continues to have poor appetite and reports eating only bites of food at each meal. Pt reports having to spit out food due to feelings of nausea with food intake.   Pt likes Ensure and has been consuming them TID. Amenable to adding Unjury as pt has an easier time drinking than eating. Pt reports not receiving Magic Cups on meal trays but would like them; will address with nutrition services.   Pt's mother reports pt is having trouble consuming even his favorite foods that she has brought into the hospital. Pt/family have interest in switching to a modified texture  (dysphagia 3) instead of soft diet. Family reports pt especially struggles to eat meat.  Family also requested assistance at mealtimes as pt has limited use of his hands.   Pt/family was interested in trying an appetite stimulant; Discussed this with the MD.  Pt reports diarrhea is improving.   Pt wt has fluctuated this admission but appears to stay within 120-130lb. Net negative 5.5L since admit  Medications: Folic acid, MVI, KCl, thiamine, cefazolin,   Labs: Na (L;127), Ca (L;8.0) K WNL  Diet Order:  DIET SOFT Room service appropriate? Yes; Fluid consistency: Thin  EDUCATION NEEDS:   No education needs have been identified at this time  Skin:  Skin Assessment: Skin Integrity Issues: Skin Integrity Issues:: Other (Comment) Other: MASD to buttocks  Last BM:  3/22 type 6  Height:   Ht Readings from Last 1 Encounters:  08/10/17 5\' 11"  (1.803 m)    Weight:   Wt Readings from Last 1 Encounters:  08/25/17 126 lb 9.6 oz (57.4 kg)    Ideal Body Weight:  78.2 kg  BMI:  Body mass index is 17.66 kg/m.  Estimated Nutritional Needs:   Kcal:  2000-2200  Protein:  90-100 gm  Fluid:  1.5-2 L    Aarthi Uyeno, MS, Dietetic Intern Pager # [phone]

## 2017-08-25 NOTE — Progress Notes (Signed)
Physical Therapy Treatment Patient Details Name: Marcus [name] MRN: [id number] DOB: 1965/08/07 Today's Date: 08/25/2017    History of Present Illness Marcus [name] is a 52 y.o. male presenting with worsening SOB. 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   Had 1.4 L fluid removed from R lung via thoracentesis.  Dx w/ HCAP.    PT Comments    Pt received in bed and agreeable to therapy. Pt with c/o intestinal gas/pain and back pain. He required mod assist supine to sit and and +2 mod assist transfer bed to chair with RW. Pt encouraged to get OOB to chair for meals. D/C recommendation updated to SNF as CIR was denied. Goals reviewed and updated. PT to continue per POC.    Follow Up Recommendations  SNF     Equipment Recommendations  Wheelchair cushion (measurements PT);Wheelchair (measurements PT);Hospital bed    Recommendations for Other Services       Precautions / Restrictions Precautions Precautions: Fall Restrictions Weight Bearing Restrictions: No    Mobility  Bed Mobility Overal bed mobility: Needs Assistance Bed Mobility: Supine to Sit     Supine to sit: Mod assist     General bed mobility comments: +rail, verbal cues for sequencing, use of bed pad to scoot to EOB  Transfers Overall transfer level: Needs assistance Equipment used: Rolling walker (2 wheeled) Transfers: Sit to/from [employer] Sit to Stand: Mod assist;+2 physical assistance Stand pivot transfers: Mod assist;+2 physical assistance       General transfer comment: Sit to stand x 3 trials. Pt unable to attain full upright stance, performed pivot transfer in flexed position. Verbal cues for sequencing increased time to complete.   Ambulation/Gait             General Gait Details: unable   Stairs            Wheelchair Mobility    Modified Rankin (Stroke Patients Only)        Balance   Sitting-balance support: No upper extremity supported;Feet supported Sitting balance-Leahy Scale: Fair     Standing balance support: Bilateral upper extremity supported Standing balance-Leahy Scale: Zero Standing balance comment: significant assist from therapists and RW to maintain stance                            Cognition Arousal/Alertness: Awake/alert Behavior During Therapy: Flat affect   Area of Impairment: Problem solving                   Current Attention Level: Selective   Following Commands: Follows one step commands with increased time     Problem Solving: Slow processing General Comments: Pt oriented and appropriate. Decreased problem solving and slow processing noted.       Exercises      General Comments General comments (skin integrity, edema, etc.): VSS      Pertinent Vitals/Pain Pain Assessment: Faces Faces Pain Scale: Hurts even more Pain Location: L knee and back Pain Descriptors / Indicators: Grimacing;Guarding Pain Intervention(s): Limited activity within patient's tolerance;Monitored during session;Repositioned    Home Living                      Prior Function            PT Goals (current goals can now be found in the care plan section) Acute Rehab PT Goals  Patient Stated Goal: none stated PT Goal Formulation: With patient Time For Goal Achievement: 09/08/17 Potential to Achieve Goals: Fair Additional Goals Additional Goal #1: patient to propel w/c 100' min A Progress towards PT goals: Progressing toward goals    Frequency    Min 3X/week      PT Plan Discharge plan needs to be updated    Co-evaluation PT/OT/SLP Co-Evaluation/Treatment: Yes Reason for Co-Treatment: For patient/therapist safety;Complexity of the patient's impairments (multi-system involvement) PT goals addressed during session: Mobility/safety with mobility        AM-PAC PT "6 Clicks" Daily Activity  Outcome  Measure  Difficulty turning over in bed (including adjusting bedclothes, sheets and blankets)?: Unable Difficulty moving from lying on back to sitting on the side of the bed? : Unable Difficulty sitting down on and standing up from a chair with arms (e.g., wheelchair, bedside commode, etc,.)?: Unable Help needed moving to and from a bed to chair (including a wheelchair)?: A Lot Help needed walking in hospital room?: Total Help needed climbing 3-5 steps with a railing? : Total 6 Click Score: 7    End of Session Equipment Utilized During Treatment: Gait belt Activity Tolerance: Patient tolerated treatment well Patient left: in chair;with chair alarm set;with call bell/phone within reach;with family/visitor present Nurse Communication: Mobility status PT Visit Diagnosis: Other abnormalities of gait and mobility (R26.89);Muscle weakness (generalized) (M62.81);Difficulty in walking, not elsewhere classified (R26.2);Hemiplegia and hemiparesis Hemiplegia - Right/Left: Right Hemiplegia - caused by: Cerebral infarction     Time: 1202-1226 PT Time Calculation (min) (ACUTE ONLY): 24 min  Charges:  $Therapeutic Activity: 8-22 mins                    G Codes:       Aida RaiderWendy Shavonn Convey, PT  Office # [phone] Pager [phone]    Ilda FoilGarrow, Naoki Migliaccio Rene 08/25/2017, 12:49 PM

## 2017-08-25 NOTE — Progress Notes (Signed)
CSW confirmed that only placement option for this patient for [facility] antibiotics [facility]- informed medical team  Burna SisJenna H. Tamim Skog, LCSW Clinical Social Worker [phone]

## 2017-08-25 NOTE — Progress Notes (Signed)
FPTS Interim Progress Note  Went by to see patient. Mother at bedside. Patient in better spirits. Says he ate more and was able to work with PT/OT. Reporting "pins and needles" feeling in feet bilaterally. States this is a chronic problem and he notices it most because he is in bed all day. Sensation intact bilaterally in feet. Discussed weaning pain medications and patient agreeable to weaning to 2.5mg  q6h.  -will wean oxycodone to 2.5mg  q6hrs -add gabapentin 100mg  daily  -OT recommending CIR placement, IP rehab pre-admission screen ordered   Oralia ManisAbraham, Soraiya Ahner, DO 08/25/2017, 4:08 PM PGY-1, [facility] pager [phone]

## 2017-08-25 NOTE — Progress Notes (Signed)
Patient ID: Marcus [name], male   DOB: 11/20/1965, 52 y.o.   MRN: [id number]         [facility]  Date of Admission:  08/09/2017    Day 17 MSSA therapy        Day 5 [name] therapy         ASSESSMENT: He was hospitalized in January with MSSA bacteremia complicated by mitral valve endocarditis, septic CNS emboli, meningitis, septic left knee and T6-7 discitis.  He completed 6 weeks of IV antibiotic therapy but is now back in the hospital with probable worsening of his mitral regurgitation, a persistent vegetation and persistent back pain.  Although all recent cultures have been negative I favor continuing treatment for probable persistent MSSA infection for now.  However, I am comfortable converting to oral cephalexin given that all recent cultures have been negative.  He also has superimposed [name] colitis but this is improving after a switch to fidaxomicin.  PLAN: 1. Change cefazolin to oral cephalexin and treat for 11 more days 2. Continue fidaxomicin 18 more days (1 week after stopping cephalexin)  Principal Problem:   Enteritis due to Clostridium difficile Active Problems:   Endocarditis of mitral valve   Acute heart failure (HCC)   Severe mitral regurgitation   HAP (hospital-acquired pneumonia)   Thoracic discitis   Hepatitis C antibody positive in blood   Sepsis (HCC)   Chronic bilateral pleural effusions   Protein-calorie malnutrition, severe   Loose stools   Osteomyelitis (HCC)   Scheduled Meds: . aspirin EC  81 mg Oral Daily  . enoxaparin (LOVENOX) injection  40 mg Subcutaneous Q24H  . feeding supplement (ENSURE ENLIVE)  237 mL Oral TID BM  . fidaxomicin  200 mg Oral BID  . folic acid  1 mg Oral Daily  . gabapentin  100 mg Oral QHS  . multivitamin with minerals  1 tablet Oral Daily  . mupirocin ointment   Nasal BID  . oxyCODONE  2.5 mg Oral Q6H  . potassium chloride  20 mEq Oral Daily  . thiamine  100 mg Oral Daily   Or  .  thiamine  100 mg Intravenous Daily   Continuous Infusions: . sodium chloride    .  ceFAZolin (ANCEF) IV Stopped (08/25/17 1021)  . lactated ringers 10 mL/hr at 08/21/17 1230  . sodium chloride irrigation     PRN Meds:.sodium chloride, acetaminophen, albuterol, ondansetron (ZOFRAN) IV, simethicone   SUBJECTIVE: He is feeling much better today.  He is only had one watery bowel movement today and one yesterday.  He is not having any nausea or vomiting.  His appetite is improving.  He was able to work with physical therapy today.  Review of Systems: Review of Systems  Constitutional: Positive for malaise/fatigue and weight loss. Negative for chills, diaphoresis and fever.  HENT: Negative for congestion and sore throat.   Respiratory: Negative for cough, sputum production and shortness of breath.   Cardiovascular: Negative for chest pain.  Gastrointestinal: Positive for diarrhea. Negative for abdominal pain, nausea and vomiting.  Genitourinary: Negative for dysuria.  Musculoskeletal: Positive for back pain. Negative for joint pain.  Skin: Negative for rash.  Neurological: Negative for dizziness and headaches.    No Known Allergies  OBJECTIVE: Vitals:   08/25/17 0948 08/25/17 1210 08/25/17 1459 08/25/17 1600  BP: 100/63  94/68 94/68  Pulse: 92 89  95  Resp:      Temp:   98.7 F (37.1 C)  TempSrc:   Oral   SpO2:   93%   Weight:      Height:       Body mass index is 17.66 kg/m.  Physical Exam  Constitutional: He is oriented to person, place, and time.  He is more alert and in better spirits today.  His mother is visiting.  HENT:  Now status post dental extractions.  Cardiovascular: Normal rate and regular rhythm.  Murmur heard. 2/6 systolic murmur.  Pulmonary/Chest: Effort normal. He has no wheezes. He has no rales.  Slight kyphotic deformity and mid thoracic spine.  Abdominal: Soft. There is no tenderness. There is no rebound.  Active bowel sounds.  Neurological: He  is alert and oriented to person, place, and time.  Skin: No rash noted.  Psychiatric: Mood and affect normal.    Lab Results Lab Results  Component Value Date   WBC 13.7 (H) 08/25/2017   HGB 8.7 (L) 08/25/2017   HCT 27.3 (L) 08/25/2017   MCV 88.6 08/25/2017   PLT 328 08/25/2017    Lab Results  Component Value Date   CREATININE 0.86 08/25/2017   BUN 14 08/25/2017   NA 127 (L) 08/25/2017   K 4.0 08/25/2017   CL 92 (L) 08/25/2017   CO2 26 08/25/2017    Lab Results  Component Value Date   ALT <5 (L) 08/16/2017   AST 20 08/16/2017   ALKPHOS 101 08/16/2017   BILITOT 0.4 08/16/2017     Microbiology: Recent Results (from the past 240 hour(s))  C difficile quick scan w PCR reflex     Status: Abnormal   Collection Time: 08/20/17  5:19 PM  Result Value Ref Range Status   C Diff antigen POSITIVE (A) NEGATIVE Final    Comment: CRITICAL RESULT CALLED TO, READ BACK BY AND VERIFIED WITH: M.NEWELL,RN AT 2112 BY L.PITT 08/20/17    C Diff toxin POSITIVE (A) NEGATIVE Final    Comment: CRITICAL RESULT CALLED TO, READ BACK BY AND VERIFIED WITH: M.NEWELL,RN AT 2112 BY L.PITT 08/20/17    C Diff interpretation Toxin producing [name] detected.  Final    Cliffton AstersJohn Javonne Dorko, MD [facility] [phone] pager   [phone] cell 08/25/2017, 4:58 PM

## 2017-08-26 LAB — BASIC METABOLIC PANEL
Anion gap: 9 (ref 5–15)
BUN: 14 mg/dL (ref 6–20)
CALCIUM: 8.2 mg/dL — AB (ref 8.9–10.3)
CO2: 25 mmol/L (ref 22–32)
CREATININE: 0.82 mg/dL (ref 0.61–1.24)
Chloride: 95 mmol/L — ABNORMAL LOW (ref 101–111)
GFR calc Af Amer: >60 mL/min (ref >60)
GFR calc non Af Amer: >60 mL/min (ref >60)
GLUCOSE: 95 mg/dL (ref 65–99)
POTASSIUM: 4.3 mmol/L (ref 3.5–5.1)
SODIUM: 129 mmol/L — AB (ref 135–145)

## 2017-08-26 LAB — CBC
HEMATOCRIT: 26.9 % — AB (ref 39.0–52.0)
Hemoglobin: 8.6 g/dL — ABNORMAL LOW (ref 13.0–17.0)
MCH: 27.7 pg (ref 26.0–34.0)
MCHC: 32 g/dL (ref 30.0–36.0)
MCV: 86.8 fL (ref 78.0–100.0)
PLATELETS: 363 10*3/uL (ref 150–400)
RBC: 3.1 MIL/uL — ABNORMAL LOW (ref 4.22–5.81)
RDW: 14.7 % (ref 11.5–15.5)
WBC: 13.2 10*3/uL — AB (ref 4.0–10.5)

## 2017-08-26 NOTE — Progress Notes (Addendum)
3:07 pm LOG is not approved for patient to go SNF, as patient is no longer on IV antibiotics, and patient left facility last time he was there on LOG. CSW clinical supervisor has approved 2 week LOG for home health PT. RNCM following for medication needs, as patient's PO antibiotics are costly, as well as home health needs. MD aware. CSW signing off.   2:30 pm CSW sent new referrals to SNFs, with updates that patient has now transitioned to PO antibiotics. CSW also consulting with clinical supervisor to determine if LOG will still be approved, as patient's Medicaid still pending, but no longer requiring IV antibiotics. At this time, [facility] offer [facility] which patient and family refuse. CSW following up with alternate facilities to seek LOG bed. CSW to follow and support with disposition planning.  Abigail ButtsSusan Loretta Kluender, LCSWA [phone]

## 2017-08-26 NOTE — Progress Notes (Signed)
FPTS Interim Progress Note:   Discussed with CM, Difficid patient assistance was approved and the medicine will be overnighted to his home address today. Will give AM dose here tomorrow and plan to d/c in pm if able. [facility]   Loni MuseKate Timberlake, MD

## 2017-08-26 NOTE — Progress Notes (Addendum)
Family Medicine Teaching Service Daily Progress Note Intern Pager: [phone]  Patient name: Marcus [name] Medical record number: [id number] Date of birth: 27-Feb-1966 Age: 52 y.o. Gender: male  Primary Care Provider: Patient, No Pcp Per Consultants: CCM, ID, Cardiology, CVTS, Dental  Code Status: Full   Pt Overview and Major Events to Date:  3/09: Admit forsepsis thought to be secondary to HAP and new onsetHF,cultures obtained, broad-spectrum antibiotics initiated 3/10: PCCM consulted,tolerating BiPAP 3/10: thoracentesis 3/14: TEE with severe MR, CVTS consulted 3/20: C diff positive - oral vanc startred 3/21: full dental extraction  Assessment and Plan: ARSENIY TOOMEY a 52 y.o.malepresenting with worsening SOB. 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  Endocarditis of mitral valve h/oembolic strokeand metabolic encephalopathy new onset heart failure Patient does have signs concerning for new onset heart failure 2/2 vavular dysfunction.Leukocytosisimproved today at13.2 from 13.7.  -cardiology consulted, appreciate recommendations:can consider low dose ASA -daily weight -strict Is and Os -CVTS consulted, appreciate recommendations: not candidate for surgery until colitis is completely resolved  -ID following: change cefazolin to oral cephalexin (3/26-) and treat for 11 days, continue fidaxomicin 18 more days -Dentistry consulted,appreciate recommendations: s/p full extraction3/21 -continue cephalexin (3/26-) for total of 11 days  -will DC PICC line   Osteomyelitis MRI on spine on 08/19/17 showing T7-T8 discitis-osteomyelitis without abscess and C6-7 suspicious for discitis-osteomyelitis without abscess. Mild spinal stenosis of T7-8 also seen.  -ID following, appreciate recommendations   Anemia Chronic, stable.Current Hgb8.6. Appears to be chronically low with baseline of  8-9. Normocytic  -continue to monitor with daily CBC  Left knee edema h/o left MSSA septic arthritis Presented with lower extremity edema. Does have recent septic arthritis of the knee.DVT r/o with LE dopplers. -continue to monitor   Hepatitis C genotype 2B Noted during previous hospitalization. No prior treatment. Recent viral load 224K.LFTsstable.  -ID consulted, appreciate recommendations: recommended outpatient tx   Compression deformities of mid to lower thoracic spine Patientreports history of fall back in January 2019he continues to have persistent low back pain since discharge. Patient reports use of OxyContin and oxycodone for pain control though no medications per chart review. PT recommending SNF.  -continuepercocet 5-325 q6H scheduled, wean as tolerated -MRI spineunder general anesthesia  Polysubstance abuse UDS positive for barbiturates and opiates on presentation. Also has history of cocaine back in January 2019. Has known endocarditis likely from IV drug use though patient reports no recentillicit drug use.  -Continue onCIWA: 1>1 overnight -Daily folate and thiamine -monitor COWS score: 3>2>2>2 -percocet 5 Q6, can plan to wean as tolerated  Hyponatremia Improving.Current Nastable/low at129.Likely 2/2 poor oral intake as patient has been NPO on and off for several days and likely had poor oral intake prior to admission. -monitor on daily BMP -encourage POintake  Hypokalemia-resolved K of4.3 -replete with KDur as needed -monitor on daily BMP  Protein calorie malnutrition  Albumin of 2.3 on 3/16. Likely 2/2 poor oral intake -consult nutrition, appreciate recommendations  C-diff -enteric precautions -ID consulted, appreciate recommendations: continue dificid for 18 days  Disposition Will likely needSNF due to need for longer term care, refusing bed offer [facility]  -CSW consulted appreciate recommendations:bed offered  at [facility] but patient refusing due to negative experience during last admission there  FEN/GI:soft diet  JJO:ACZYSAY   Disposition: will likely dc home with home health as patient refusing SNF at [facility]   Subjective:  Patient today stating improvement. Very happy  he was switched to oral antibiotics. Patient denies SOB or CP. Patient states no BM since yesterday afternoon after lunch. States he would like to continue to work with PT to regain strength   Objective: Temp:  [98.7 F (37.1 C)] 98.7 F (37.1 C) (03/25 2009) Pulse Rate:  [87-96] 87 (03/26 0657) Resp:  [11-27] 25 (03/26 0657) BP: (93-99)/(66-69) 93/69 (03/26 0657) SpO2:  [93 %-99 %] 98 % (03/26 0657) Weight:  [126 lb 15.8 oz (57.6 kg)] 126 lb 15.8 oz (57.6 kg) (03/26 0657) Physical Exam: General: awake and alert, laying in bed, NAD  Cardiovascular: RRR, grade 2 systolic murmur  Respiratory: CTAB, no increased WOB, speaking full sentences  Abdomen: minimally tender, non distended, bowel sounds normal  Extremities: non tender, no edema, no tenderness around knee   Laboratory: Recent Labs  Lab 08/24/17 0400 08/25/17 0428 08/26/17 0408  WBC 15.6* 13.7* 13.2*  HGB 8.4* 8.7* 8.6*  HCT 26.4* 27.3* 26.9*  PLT 336 328 363   Recent Labs  Lab 08/24/17 0400 08/25/17 0428 08/26/17 0408  NA 131* 127* 129*  K 3.6 4.0 4.3  CL 95* 92* 95*  CO2 27 26 25   BUN 16 14 14   CREATININE 0.89 0.86 0.82  CALCIUM 8.0* 8.0* 8.2*  GLUCOSE 105* 92 95     Imaging/Diagnostic Tests: Dg Orthopantogram  Result Date: 08/15/2017 CLINICAL DATA:  Poor dentition.  Cardiac workup. EXAM: ORTHOPANTOGRAM/PANORAMIC COMPARISON:  None. FINDINGS: There are numerous missing maxillary and mandibular teeth, and multiple remaining maxillary teeth appear broken. There is mild periapical lucency associated with a left maxillary premolar tooth. IMPRESSION: Poor dentition with multiple missing and broken teeth. Mild periapical lucency  associated with a left maxillary premolar tooth, abscess not excluded. Electronically Signed   By: Sebastian AcheAllen  Grady M.D.   On: 08/15/2017 09:12   Dg Chest 2 View  Result Date: 08/23/2017 CLINICAL DATA:  Bilateral pleural effusions EXAM: CHEST - 2 VIEW COMPARISON:  08/14/2017 the FINDINGS: Moderate right pleural effusion is similar to prior. Tiny left pleural effusion noted. Airspace opacity in the right base has decreased in the interval. Cardiopericardial silhouette is at upper limits of normal for size. The visualized bony structures of the thorax are intact. Telemetry leads overlie the chest. IMPRESSION: Interval decrease and airspace disease at the right base with similar appearance of moderate right and small left pleural effusions. Electronically Signed   By: Kennith CenterEric  Mansell M.D.   On: 08/23/2017 16:41   Dg Chest 2 View  Result Date: 08/14/2017 CLINICAL DATA:  Followup pneumonia. EXAM: CHEST - 2 VIEW COMPARISON:  08/10/2016 and older studies. FINDINGS: Moderate right and small left pleural effusions. There is associated lung base opacity, greater on the right, which is consistent with atelectasis, pneumonia or a combination. The patchy airspace opacity noted along the perihilar left lung on the prior study has improved. Airspace opacity in the right mid to lower lung appears somewhat less confluent but is unchanged in extent. No new lung abnormalities.  No pneumothorax. IMPRESSION: 1. Mild improvement in lung consolidation compared to the prior exam. 2. Persistent moderate right and small left pleural effusions. No new abnormalities. Electronically Signed   By: Amie Portlandavid  Ormond M.D.   On: 08/14/2017 13:16   Dg Chest 2 View  Addendum Date: 08/09/2017   ADDENDUM REPORT: 08/09/2017 15:41 ADDENDUM: After further review, there are compression deformities of 2 adjacent vertebral bodies in the mid to lower thoracic spine, of uncertain age but new compared to an older chest x-ray 02/15/2005.  Would consider further  characterization with CT. These results were called by telephone at the time of interpretation on 08/09/2017 at 3:40 pm to Dr. Doug Sou , who verbally acknowledged these results. Electronically Signed   By: Bary Richard M.D.   On: 08/09/2017 15:41   Result Date: 08/09/2017 CLINICAL DATA:  Shortness of breath for 2 days. Bacterial meningitis diagnosed at the beginning of January, than recently diagnosed with pneumonia. History of TIA in January with left-sided weakness. Former smoker. EXAM: CHEST - 2 VIEW COMPARISON:  Chest x-ray dated 06/08/2017. FINDINGS: New dense opacity at the right lung base, likely a combination of consolidation and pleural effusion. Pleural effusion component is at least moderate in size. Patchy opacities are seen within the left perihilar and lower lung zones, favor edema. Additional opacity at the left lung base is likely a combination of atelectasis and small pleural effusion. Heart size and mediastinal contours are stable. Osseous structures about the chest are unremarkable. IMPRESSION: 1. New dense opacity at the right lung base, likely a combination of pneumonia or atelectasis and pleural effusion. The pleural effusion component is at least moderate in size. Would consider chest CT for further characterization. 2. Additional patchy ill-defined opacities within the left mid and lower lung, most likely edema, pneumonia considered less likely. 3. Probable mild atelectasis and/or small pleural effusion at the left lung base. Electronically Signed: By: Bary Richard M.D. On: 08/09/2017 15:29   Ct Angio Chest Pe W And/or Wo Contrast  Result Date: 08/09/2017 CLINICAL DATA:  SOB x2 days. Pt diagnosed with bacterial meningitis at the beginning of January, then recently diagnosed with pneumonia. Pt also has hx of TIA beginning of January that left him with left sided weakness. Patient had left knee surgery x 6 weeks ago for sepsis. EXAM: CT ANGIOGRAPHY CHEST WITH CONTRAST TECHNIQUE:  Multidetector CT imaging of the chest was performed using the standard protocol during bolus administration of intravenous contrast. Multiplanar CT image reconstructions and MIPs were obtained to evaluate the vascular anatomy. CONTRAST:  ISOVUE-370 IOPAMIDOL (ISOVUE-370) INJECTION 76% COMPARISON:  Current chest radiographs and prior studies. FINDINGS: Cardiovascular: Satisfactory opacification of the pulmonary arteries to the segmental level. No evidence of pulmonary embolism. Heart is normal in size and configuration. No pericardial effusion. No coronary artery calcifications. The great vessels normal in caliber. No aortic atherosclerosis. No dissection. Mediastinum/Nodes: No neck base or axillary masses or pathologically enlarged lymph nodes. No mediastinal or hilar masses or discrete enlarged lymph nodes. The trachea is patent and normal in caliber. Esophagus is unremarkable. Lungs/Pleura: Large right and small left pleural effusions. Complete atelectasis of the right middle lobe and near complete atelectasis of the right lower lobe. Small area of consolidation in the anterior inferior right upper lobe. Patchy consolidation is noted in a peribronchovascular distribution in the left upper lobe and, to lesser degree, left lower lobe. There is dependent opacity also in the left lower lobe consistent with atelectasis. No pneumothorax. Upper Abdomen: No acute abnormality. Musculoskeletal: There is marked narrowing of the T6-T7 disc space with resorption/irregularity of the lower endplate of T6 in upper endplate of T7, with mild loss of height of these vertebra. This leads to a mild focal kyphosis. This is new since a chest radiograph dated 02/15/2005. A subtle lucency across the T6 spinous process posteriorly suggests a previous fracture. All remaining vertebral bodies are normal in height. No osteoblastic or osteolytic lesions. Review of the MIP images confirms the above findings. IMPRESSION: 1. Patchy areas of  consolidation in both lungs consistent with multifocal pneumonia. 2. Complete atelectasis of the right middle lobe and near complete atelectasis of the right lower lobe. Mild dependent atelectasis in the left lower lobe. 3. Large right and small left pleural effusions. 4. Marked loss of the disc space at T6-C7 with irregular resorption of endplates leading to loss of vertebral body height of T6 and T7 and a focal kyphosis. This may reflect the sequelae of an old fracture. It could be due to previous discitis/osteomyelitis. Electronically Signed   By: Amie Portland M.D.   On: 08/09/2017 18:44   Mr Brain Wo Contrast  Result Date: 08/14/2017 CLINICAL DATA:  52 y/o  M; stroke follow-up. EXAM: MRI HEAD WITHOUT CONTRAST TECHNIQUE: Axial DWI sequence was acquired. Patient combative, unable to continue examination. COMPARISON:  06/09/2017 MRI head.  06/08/2017 CT head. FINDINGS: Low B value DWI sequence demonstrates areas of T2 hyperintensity in the right greater than left frontal, parietal, and occipital lobes as well as splenium of corpus callosum and cerebellum corresponding to infarctions on prior MRI. The high B value sequence is motion degraded, insufficient to assess for reduced diffusion and acute stroke. IMPRESSION: Severe motion degraded DWI sequence insufficient to assess for acute stroke. Repeat imaging is recommended when patient is able to hold still and follow commands. Electronically Signed   By: Mitzi Hansen M.D.   On: 08/14/2017 21:38   Mr Laqueta Jean WU Contrast  Result Date: 08/19/2017 CLINICAL DATA:  Hospitalized in January with bacteremia, mitral valve endocarditis, CNS septic emboli, and thoracic spine discitis. Persistent back pain. EXAM: MRI HEAD WITHOUT AND WITH CONTRAST MRI THORACIC SPINE WITHOUT AND WITH CONTRAST MRI LUMBAR SPINE WITHOUT AND WITH CONTRAST TECHNIQUE: Multiplanar, multiecho pulse sequences of the brain and surrounding structures, and cervical spine, to include the  craniocervical junction and cervicothoracic junction, were obtained without and with intravenous contrast. Multiplanar and multiecho pulse sequences of the thoracic and lumbar spine were obtained without and with intravenous contrast. CONTRAST:  9mL MULTIHANCE GADOBENATE DIMEGLUMINE 529 MG/ML IV SOLN COMPARISON:  Brain MRI 06/09/2017. CTA chest, abdomen, and pelvis 08/14/2017. Lumbar spine CT 12/30/2015. FINDINGS: MRI HEAD FINDINGS Brain: Embolic infarcts throughout the brain on the prior MRI demonstrate interval evolution. Associated diffusion abnormality has decreased though not completely resolved in some of the areas of infarction, and there is developing encephalomalacia. Associated chronic blood products are present with the majority of the infarcts. Multiple infarcts demonstrate enhancement. No definite restricted diffusion is seen to clearly indicate acute infarction. There are a few subcentimeter infarcts in the left centrum semiovale which are new from the prior MRI and demonstrate mild trace diffusion abnormality without reduced ADC, consistent with interval subacute infarcts. No intracranial mass, midline shift, or extra-axial fluid collection is seen. There is mild cerebral atrophy. No abnormal meningeal enhancement is identified. Vascular: Major intracranial vascular flow voids are preserved. Skull and upper cervical spine: Unremarkable bone marrow signal. Sinuses/Orbits: Unremarkable orbits. Clear paranasal sinuses. Trace left mastoid fluid. Other: None. MRI THORACIC SPINE FINDINGS Spinal numbering is performed by counting down from the craniocervical junction. This results in different numbering than what was reported on the prior CTA due to transitional lumbosacral anatomy. Some sequences are up to moderately motion degraded. Alignment: Slight focal kyphosis at T7-8.  No listhesis. Vertebrae: Disc and endplate destruction at T7-8 as seen on prior CT with diffuse enhancement throughout the residual  disc space and marrow of both T7 and T8 vertebral bodies extending into the pedicles. Minimal dorsal epidural  enhancement at T7-8 without evidence of epidural abscess. Partially visualized marrow edema and enhancement in the C6 and C7 vertebral bodies with endplate irregularity, disc space narrowing, and enhancement anteriorly in the disc space. No evidence of C6-7 epidural abscess on limited sagittal imaging. Very mild endplate edema and enhancement at T11-12 favored to be degenerative given lack of disc abnormality or endplate erosion to suggest infection. Background diminished T1 bone marrow signal intensity diffusely is likely related to patient's known anemia. 1.4 cm enhancing lesion in the posterior elements on the right at T3 is consistent with an hemangioma. A smaller atypical hemangioma is also suspected in the right T2 pedicle. Cord:  Normal signal. Paraspinal and other soft tissues: Paravertebral soft tissue edema/phlegmon from T6-T8. Interspinous soft tissue edema and enhancement at T7-8. No paraspinal abscess identified. Partially visualized prevertebral inflammation in the lower cervical spine. Moderate right and small left pleural effusions with associated compressive atelectasis on the right. Small volume fluid in the esophagus. Partially visualized endotracheal tube and tracheal secretions. Disc levels: Moderate disc space narrowing, disc bulging, and endplate spurring at C7-T1 result in moderate right and mild-to-moderate left neural foraminal stenosis and at most mild spinal stenosis. Broad-based posterior disc osteophyte complex and facet hypertrophy at T7-8 result in mild spinal stenosis without cord compression. Mild disc bulging and scattered tiny disc protrusions elsewhere in the thoracic spine do not result in spinal stenosis. MRI LUMBAR SPINE FINDINGS Some sequences are mildly motion degraded. Segmentation: Transitional lumbosacral anatomy with a lumbarized S1. Alignment: Facet mediated  anterolisthesis of L5 on S1 measuring 4 mm. Vertebrae: Diffusely diminished T1 bone marrow signal intensity likely related to anemia. Predominantly type 2 degenerative endplate changes at S1-2. Mild type 1 changes at L3-4. No evidence of infectious discitis-osteomyelitis. No fracture. Conus medullaris: Extends to the L2 level and appears normal. Paraspinal and other soft tissues: Mild nonspecific posterior paraspinal muscle edema bilaterally in the mid and lower lumbar spine. No fluid collection. Disc levels: L1-2: Negative. L2-3: Minimal disc bulging without stenosis. L3-4: Mild disc space narrowing. Mild disc bulging and mild facet and ligamentum flavum hypertrophy result in mild bilateral lateral recess stenosis and mild left neural foraminal stenosis without significant spinal stenosis. L4-5: Mild disc space narrowing. Mild disc bulging asymmetric to the right and moderate right and mild left facet hypertrophy result in mild right lateral recess stenosis and mild right neural foraminal stenosis without significant spinal stenosis. L5-S1: Anterolisthesis with disc uncovering and severe facet hypertrophy result in mild spinal stenosis, mild-to-moderate bilateral lateral recess stenosis, and mild-to-moderate bilateral neural foraminal stenosis. S1-2: Transitional level with disc space narrowing. Mild disc bulging and moderate facet arthrosis result in mild left neural foraminal stenosis without spinal stenosis. IMPRESSION: 1. 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 2. Interval tiny subacute infarcts in the left centrum semiovale. No acute infarct. 3. Transitional lumbosacral anatomy as above. 4. 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 No epidural or paraspinal abscess. 5. Partially visualized disc and endplate enhancement at C6-7 suspicious for discitis-osteomyelitis with prevertebral inflammation. No evidence of epidural abscess though this was  incompletely imaged. 6. Mild spinal stenosis at T7-8. 7. No evidence of discitis-osteomyelitis in the lumbar spine. 8. Nonspecific bilateral lumbar posterior paraspinal muscle edema without abscess. 9. Lumbar disc and facet degeneration most notable at L5-S1 where there is grade 1 anterolisthesis, mild spinal stenosis, and mild-to-moderate lateral recess and neural foraminal stenosis. 10. Moderate right and small left pleural effusions. Electronically Signed   By: Freida Busman  Mosetta Putt M.D.   On: 08/19/2017 15:55   Mr Thoracic Spine W Wo Contrast  Result Date: 08/19/2017 CLINICAL DATA:  Hospitalized in January with bacteremia, mitral valve endocarditis, CNS septic emboli, and thoracic spine discitis. Persistent back pain. EXAM: MRI HEAD WITHOUT AND WITH CONTRAST MRI THORACIC SPINE WITHOUT AND WITH CONTRAST MRI LUMBAR SPINE WITHOUT AND WITH CONTRAST TECHNIQUE: Multiplanar, multiecho pulse sequences of the brain and surrounding structures, and cervical spine, to include the craniocervical junction and cervicothoracic junction, were obtained without and with intravenous contrast. Multiplanar and multiecho pulse sequences of the thoracic and lumbar spine were obtained without and with intravenous contrast. CONTRAST:  9mL MULTIHANCE GADOBENATE DIMEGLUMINE 529 MG/ML IV SOLN COMPARISON:  Brain MRI 06/09/2017. CTA chest, abdomen, and pelvis 08/14/2017. Lumbar spine CT 12/30/2015. FINDINGS: MRI HEAD FINDINGS Brain: Embolic infarcts throughout the brain on the prior MRI demonstrate interval evolution. Associated diffusion abnormality has decreased though not completely resolved in some of the areas of infarction, and there is developing encephalomalacia. Associated chronic blood products are present with the majority of the infarcts. Multiple infarcts demonstrate enhancement. No definite restricted diffusion is seen to clearly indicate acute infarction. There are a few subcentimeter infarcts in the left centrum semiovale which are  new from the prior MRI and demonstrate mild trace diffusion abnormality without reduced ADC, consistent with interval subacute infarcts. No intracranial mass, midline shift, or extra-axial fluid collection is seen. There is mild cerebral atrophy. No abnormal meningeal enhancement is identified. Vascular: Major intracranial vascular flow voids are preserved. Skull and upper cervical spine: Unremarkable bone marrow signal. Sinuses/Orbits: Unremarkable orbits. Clear paranasal sinuses. Trace left mastoid fluid. Other: None. MRI THORACIC SPINE FINDINGS Spinal numbering is performed by counting down from the craniocervical junction. This results in different numbering than what was reported on the prior CTA due to transitional lumbosacral anatomy. Some sequences are up to moderately motion degraded. Alignment: Slight focal kyphosis at T7-8.  No listhesis. Vertebrae: Disc and endplate destruction at T7-8 as seen on prior CT with diffuse enhancement throughout the residual disc space and marrow of both T7 and T8 vertebral bodies extending into the pedicles. Minimal dorsal epidural enhancement at T7-8 without evidence of epidural abscess. Partially visualized marrow edema and enhancement in the C6 and C7 vertebral bodies with endplate irregularity, disc space narrowing, and enhancement anteriorly in the disc space. No evidence of C6-7 epidural abscess on limited sagittal imaging. Very mild endplate edema and enhancement at T11-12 favored to be degenerative given lack of disc abnormality or endplate erosion to suggest infection. Background diminished T1 bone marrow signal intensity diffusely is likely related to patient's known anemia. 1.4 cm enhancing lesion in the posterior elements on the right at T3 is consistent with an hemangioma. A smaller atypical hemangioma is also suspected in the right T2 pedicle. Cord:  Normal signal. Paraspinal and other soft tissues: Paravertebral soft tissue edema/phlegmon from T6-T8.  Interspinous soft tissue edema and enhancement at T7-8. No paraspinal abscess identified. Partially visualized prevertebral inflammation in the lower cervical spine. Moderate right and small left pleural effusions with associated compressive atelectasis on the right. Small volume fluid in the esophagus. Partially visualized endotracheal tube and tracheal secretions. Disc levels: Moderate disc space narrowing, disc bulging, and endplate spurring at C7-T1 result in moderate right and mild-to-moderate left neural foraminal stenosis and at most mild spinal stenosis. Broad-based posterior disc osteophyte complex and facet hypertrophy at T7-8 result in mild spinal stenosis without cord compression. Mild disc bulging and scattered tiny disc protrusions elsewhere  in the thoracic spine do not result in spinal stenosis. MRI LUMBAR SPINE FINDINGS Some sequences are mildly motion degraded. Segmentation: Transitional lumbosacral anatomy with a lumbarized S1. Alignment: Facet mediated anterolisthesis of L5 on S1 measuring 4 mm. Vertebrae: Diffusely diminished T1 bone marrow signal intensity likely related to anemia. Predominantly type 2 degenerative endplate changes at S1-2. Mild type 1 changes at L3-4. No evidence of infectious discitis-osteomyelitis. No fracture. Conus medullaris: Extends to the L2 level and appears normal. Paraspinal and other soft tissues: Mild nonspecific posterior paraspinal muscle edema bilaterally in the mid and lower lumbar spine. No fluid collection. Disc levels: L1-2: Negative. L2-3: Minimal disc bulging without stenosis. L3-4: Mild disc space narrowing. Mild disc bulging and mild facet and ligamentum flavum hypertrophy result in mild bilateral lateral recess stenosis and mild left neural foraminal stenosis without significant spinal stenosis. L4-5: Mild disc space narrowing. Mild disc bulging asymmetric to the right and moderate right and mild left facet hypertrophy result in mild right lateral recess  stenosis and mild right neural foraminal stenosis without significant spinal stenosis. L5-S1: Anterolisthesis with disc uncovering and severe facet hypertrophy result in mild spinal stenosis, mild-to-moderate bilateral lateral recess stenosis, and mild-to-moderate bilateral neural foraminal stenosis. S1-2: Transitional level with disc space narrowing. Mild disc bulging and moderate facet arthrosis result in mild left neural foraminal stenosis without spinal stenosis. IMPRESSION: 1. 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 2. Interval tiny subacute infarcts in the left centrum semiovale. No acute infarct. 3. Transitional lumbosacral anatomy as above. 4. 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 No epidural or paraspinal abscess. 5. Partially visualized disc and endplate enhancement at C6-7 suspicious for discitis-osteomyelitis with prevertebral inflammation. No evidence of epidural abscess though this was incompletely imaged. 6. Mild spinal stenosis at T7-8. 7. No evidence of discitis-osteomyelitis in the lumbar spine. 8. Nonspecific bilateral lumbar posterior paraspinal muscle edema without abscess. 9. Lumbar disc and facet degeneration most notable at L5-S1 where there is grade 1 anterolisthesis, mild spinal stenosis, and mild-to-moderate lateral recess and neural foraminal stenosis. 10. Moderate right and small left pleural effusions. Electronically Signed   By: Sebastian Ache M.D.   On: 08/19/2017 15:55   Mr Lumbar Spine W Wo Contrast  Result Date: 08/19/2017 CLINICAL DATA:  Hospitalized in January with bacteremia, mitral valve endocarditis, CNS septic emboli, and thoracic spine discitis. Persistent back pain. EXAM: MRI HEAD WITHOUT AND WITH CONTRAST MRI THORACIC SPINE WITHOUT AND WITH CONTRAST MRI LUMBAR SPINE WITHOUT AND WITH CONTRAST TECHNIQUE: Multiplanar, multiecho pulse sequences of the brain and surrounding structures, and cervical spine, to  include the craniocervical junction and cervicothoracic junction, were obtained without and with intravenous contrast. Multiplanar and multiecho pulse sequences of the thoracic and lumbar spine were obtained without and with intravenous contrast. CONTRAST:  9mL MULTIHANCE GADOBENATE DIMEGLUMINE 529 MG/ML IV SOLN COMPARISON:  Brain MRI 06/09/2017. CTA chest, abdomen, and pelvis 08/14/2017. Lumbar spine CT 12/30/2015. FINDINGS: MRI HEAD FINDINGS Brain: Embolic infarcts throughout the brain on the prior MRI demonstrate interval evolution. Associated diffusion abnormality has decreased though not completely resolved in some of the areas of infarction, and there is developing encephalomalacia. Associated chronic blood products are present with the majority of the infarcts. Multiple infarcts demonstrate enhancement. No definite restricted diffusion is seen to clearly indicate acute infarction. There are a few subcentimeter infarcts in the left centrum semiovale which are new from the prior MRI and demonstrate mild trace diffusion abnormality without reduced ADC, consistent with interval subacute infarcts. No  intracranial mass, midline shift, or extra-axial fluid collection is seen. There is mild cerebral atrophy. No abnormal meningeal enhancement is identified. Vascular: Major intracranial vascular flow voids are preserved. Skull and upper cervical spine: Unremarkable bone marrow signal. Sinuses/Orbits: Unremarkable orbits. Clear paranasal sinuses. Trace left mastoid fluid. Other: None. MRI THORACIC SPINE FINDINGS Spinal numbering is performed by counting down from the craniocervical junction. This results in different numbering than what was reported on the prior CTA due to transitional lumbosacral anatomy. Some sequences are up to moderately motion degraded. Alignment: Slight focal kyphosis at T7-8.  No listhesis. Vertebrae: Disc and endplate destruction at T7-8 as seen on prior CT with diffuse enhancement throughout  the residual disc space and marrow of both T7 and T8 vertebral bodies extending into the pedicles. Minimal dorsal epidural enhancement at T7-8 without evidence of epidural abscess. Partially visualized marrow edema and enhancement in the C6 and C7 vertebral bodies with endplate irregularity, disc space narrowing, and enhancement anteriorly in the disc space. No evidence of C6-7 epidural abscess on limited sagittal imaging. Very mild endplate edema and enhancement at T11-12 favored to be degenerative given lack of disc abnormality or endplate erosion to suggest infection. Background diminished T1 bone marrow signal intensity diffusely is likely related to patient's known anemia. 1.4 cm enhancing lesion in the posterior elements on the right at T3 is consistent with an hemangioma. A smaller atypical hemangioma is also suspected in the right T2 pedicle. Cord:  Normal signal. Paraspinal and other soft tissues: Paravertebral soft tissue edema/phlegmon from T6-T8. Interspinous soft tissue edema and enhancement at T7-8. No paraspinal abscess identified. Partially visualized prevertebral inflammation in the lower cervical spine. Moderate right and small left pleural effusions with associated compressive atelectasis on the right. Small volume fluid in the esophagus. Partially visualized endotracheal tube and tracheal secretions. Disc levels: Moderate disc space narrowing, disc bulging, and endplate spurring at C7-T1 result in moderate right and mild-to-moderate left neural foraminal stenosis and at most mild spinal stenosis. Broad-based posterior disc osteophyte complex and facet hypertrophy at T7-8 result in mild spinal stenosis without cord compression. Mild disc bulging and scattered tiny disc protrusions elsewhere in the thoracic spine do not result in spinal stenosis. MRI LUMBAR SPINE FINDINGS Some sequences are mildly motion degraded. Segmentation: Transitional lumbosacral anatomy with a lumbarized S1. Alignment: Facet  mediated anterolisthesis of L5 on S1 measuring 4 mm. Vertebrae: Diffusely diminished T1 bone marrow signal intensity likely related to anemia. Predominantly type 2 degenerative endplate changes at S1-2. Mild type 1 changes at L3-4. No evidence of infectious discitis-osteomyelitis. No fracture. Conus medullaris: Extends to the L2 level and appears normal. Paraspinal and other soft tissues: Mild nonspecific posterior paraspinal muscle edema bilaterally in the mid and lower lumbar spine. No fluid collection. Disc levels: L1-2: Negative. L2-3: Minimal disc bulging without stenosis. L3-4: Mild disc space narrowing. Mild disc bulging and mild facet and ligamentum flavum hypertrophy result in mild bilateral lateral recess stenosis and mild left neural foraminal stenosis without significant spinal stenosis. L4-5: Mild disc space narrowing. Mild disc bulging asymmetric to the right and moderate right and mild left facet hypertrophy result in mild right lateral recess stenosis and mild right neural foraminal stenosis without significant spinal stenosis. L5-S1: Anterolisthesis with disc uncovering and severe facet hypertrophy result in mild spinal stenosis, mild-to-moderate bilateral lateral recess stenosis, and mild-to-moderate bilateral neural foraminal stenosis. S1-2: Transitional level with disc space narrowing. Mild disc bulging and moderate facet arthrosis result in mild left neural foraminal stenosis without spinal  stenosis. IMPRESSION: 1. 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 2. Interval tiny subacute infarcts in the left centrum semiovale. No acute infarct. 3. Transitional lumbosacral anatomy as above. 4. 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 No epidural or paraspinal abscess. 5. Partially visualized disc and endplate enhancement at C6-7 suspicious for discitis-osteomyelitis with prevertebral inflammation. No evidence of epidural abscess though this  was incompletely imaged. 6. Mild spinal stenosis at T7-8. 7. No evidence of discitis-osteomyelitis in the lumbar spine. 8. Nonspecific bilateral lumbar posterior paraspinal muscle edema without abscess. 9. Lumbar disc and facet degeneration most notable at L5-S1 where there is grade 1 anterolisthesis, mild spinal stenosis, and mild-to-moderate lateral recess and neural foraminal stenosis. 10. Moderate right and small left pleural effusions. Electronically Signed   By: Sebastian Ache M.D.   On: 08/19/2017 15:55   Dg Chest Port 1 View  Result Date: 08/10/2017 CLINICAL DATA:  Post right thora, 1.4 L removed EXAM: PORTABLE CHEST 1 VIEW COMPARISON:  Chest x-ray dated 08/09/2017. FINDINGS: Some improvement in aeration at the left lung base. Residual opacity is presumably atelectasis or pneumonia. Ill-defined opacities persist throughout the left lung, consistent with the multifocal pneumonia better demonstrated on chest CT of 08/09/2017. Additional persistent atelectasis at the left lung base. No pneumothorax. Heart size and mediastinal contours appear stable. IMPRESSION: 1. Improved aeration at the right lung base status post thoracentesis. Residual opacity is presumably atelectasis or pneumonia. No pneumothorax seen. 2. Stable patchy opacities throughout the left lung, compatible with the multifocal pneumonia demonstrated on yesterday's chest CT. Electronically Signed   By: Bary Richard M.D.   On: 08/10/2017 14:54   Ct Angio Chest/abd/pel For Dissection W And/or W/wo  Result Date: 08/14/2017 CLINICAL DATA:  Septic arterial embolism EXAM: CT ANGIOGRAPHY CHEST, ABDOMEN AND PELVIS TECHNIQUE: Multidetector CT imaging through the chest, abdomen and pelvis was performed using the standard protocol during bolus administration of intravenous contrast. Multiplanar reconstructed images and MIPs were obtained and reviewed to evaluate the vascular anatomy. CONTRAST:  100 cc ISOVUE-370 IOPAMIDOL (ISOVUE-370) INJECTION 76%  COMPARISON:  Chest CT 08/09/2017 FINDINGS: CTA CHEST FINDINGS Cardiovascular: No acute pulmonary embolus. Satisfactory opacification of the aorta and pulmonary arteries. Normal size heart without pericardial effusion. Normal branch pattern of the great vessels off the arch. No coronary arteriosclerosis. No aortic atherosclerosis. No aneurysm. No aortic dissection. Mediastinum/Nodes: No supraclavicular, axillary, mediastinal nor hilar lymphadenopathy. Trachea is patent and normal caliber without intraluminal abnormality. Esophagus is unremarkable. No thyromegaly is noted. Lungs/Pleura: Bilateral moderate-sized pleural effusions slightly smaller on the right with loculated component. Adjacent compressive atelectasis is noted. Patchy airspace opacities in the left upper lobe are redemonstrated, with slight improvement in aeration since prior though the vast majority of the pulmonary opacity still persists. Musculoskeletal: Redemonstration of marked disc space narrowing C6-7 with endplate irregularity of the inferior endplate of T6 superior endplate of T7 with resultant mild focal kyphosis. Review of the MIP images confirms the above findings. CTA ABDOMEN AND PELVIS FINDINGS VASCULAR Aorta: Normal caliber aorta without aneurysm, dissection, vasculitis or significant stenosis. Celiac: Patent without evidence of aneurysm, dissection, vasculitis or significant stenosis. SMA: Patent without evidence of aneurysm, dissection, vasculitis or significant stenosis. Renals: Single renal arteries bilaterally without acute abnormality. No significant stenosis, dissection or evidence of fibromuscular dysplasia. IMA: Patent Inflow: No aneurysm or dissection. Minimal atherosclerotic plaque in the proximal right common iliac and both distal iliac arteries. No significant stenosis. No dissection or aneurysm. Veins: No obvious venous abnormality within the limitations  of this arterial phase study. Review of the MIP images confirms the  above findings. NON-VASCULAR Hepatobiliary: No focal liver abnormality is seen. No gallstones, gallbladder wall thickening, or biliary dilatation. Pancreas: Unremarkable. No pancreatic ductal dilatation or surrounding inflammatory changes. Spleen: Heterogeneous enhancement likely due to timing of contrast bolus. No splenomegaly. Adrenals/Urinary Tract: No adrenal no renal masses. No nephrolithiasis nor obstructive uropathy. No hydroureteronephrosis. Unremarkable bladder. Stomach/Bowel: Nondistended stomach with normal small bowel rotation. Mild fluid-filled distention of small bowel loops with slight mural thickening of jejunum may reflect a mild small bowel enteritis. No mechanical source obstruction is seen. Colon is. Normal appearing appendix. Lymphatic: No abdominopelvic adenopathy. Reproductive: Normal size prostate and seminal vesicles. Other: Small amount of fluid in the left inguinal canal. Musculoskeletal: Plate and screw fixation of the left acetabulum. 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 No acute osseous abnormality. There is lower lumbar degenerative facet arthropathy. Minimal anterolisthesis of L4 on L5. Marked disc space narrowing at L5-S1. Review of the MIP images confirms the above findings. IMPRESSION: Chest CT: 1. No acute pulmonary embolus. 2. No aortic aneurysm or dissection. 3. Bilateral moderate-sized pleural effusions slightly smaller on the right with loculated component redemonstrated. Adjacent compressive atelectasis is seen both lower lobes. 4. Relatively unchanged patchy airspace opacity in the left upper lobe only minimally improved in aeration. 5. Redemonstration of focal kyphosis at T6-7 secondary to endplate irregularities and disc space narrowing suspicious for changes of discitis/osteomyelitis. MRI would be the study of choice for further correlation as to acuity. Abdomen and pelvic CT: 1. No aortic aneurysm or dissection. Patent branch vessels without significant  stenosis or focal abnormality. 2. Mild fluid-filled distention of small bowel which transmural thickening suspicious for changes of mild enteritis. No mechanical bowel obstruction. Electronically Signed   By: Tollie Eth M.D.   On: 08/14/2017 21:21   US Thoracentesis Asp Pleural Space W/img Guide  Result Date: 08/10/2017 INDICATION: Respiratory failure. Large right pleural effusion. Request for diagnostic and therapeutic thoracentesis. EXAM: ULTRASOUND GUIDED RIGHT THORACENTESIS MEDICATIONS: None. COMPLICATIONS: None immediate. PROCEDURE: An ultrasound guided thoracentesis was thoroughly discussed with the patient and questions answered. The benefits, risks, alternatives and complications were also discussed. The patient understands and wishes to proceed with the procedure. Written consent was obtained. Ultrasound was performed to localize and mark an adequate pocket of fluid in the right chest. The area was then prepped and draped in the normal sterile fashion. 1% Lidocaine was used for local anesthesia. Under ultrasound guidance a Safe-T-Centesis catheter was introduced. Thoracentesis was performed. The catheter was removed and a dressing applied. FINDINGS: A total of approximately 1.4 L of pink/blood-tinged fluid was removed. Samples were sent to the laboratory as requested by the clinical team. IMPRESSION: Successful ultrasound guided right thoracentesis yielding 1.4 L of pleural fluid. Read by: Brayton El PA-C Electronically Signed   By: Corlis Leak M.D.   On: 08/10/2017 12:55     Oralia Manis, DO 08/26/2017, 12:23 PM PGY-1, [facility] FPTS Intern pager: [phone], text pages welcome

## 2017-08-27 ENCOUNTER — Inpatient Hospital Stay (HOSPITAL_COMMUNITY): Payer: Medicaid Other

## 2017-08-27 ENCOUNTER — Ambulatory Visit: Payer: Self-pay | Admitting: Neurology

## 2017-08-27 ENCOUNTER — Encounter: Payer: Self-pay | Admitting: Neurology

## 2017-08-27 DIAGNOSIS — Z01810 Encounter for preprocedural cardiovascular examination: Secondary | ICD-10-CM

## 2017-08-27 LAB — CBC
HCT: 26.3 % — ABNORMAL LOW (ref 39.0–52.0)
Hemoglobin: 8.5 g/dL — ABNORMAL LOW (ref 13.0–17.0)
MCH: 28.5 pg (ref 26.0–34.0)
MCHC: 32.3 g/dL (ref 30.0–36.0)
MCV: 88.3 fL (ref 78.0–100.0)
PLATELETS: 375 10*3/uL (ref 150–400)
RBC: 2.98 MIL/uL — ABNORMAL LOW (ref 4.22–5.81)
RDW: 15 % (ref 11.5–15.5)
WBC: 12.2 10*3/uL — ABNORMAL HIGH (ref 4.0–10.5)

## 2017-08-27 LAB — BASIC METABOLIC PANEL
Anion gap: 7 (ref 5–15)
BUN: 15 mg/dL (ref 6–20)
CALCIUM: 8.3 mg/dL — AB (ref 8.9–10.3)
CO2: 25 mmol/L (ref 22–32)
CREATININE: 0.83 mg/dL (ref 0.61–1.24)
Chloride: 99 mmol/L — ABNORMAL LOW (ref 101–111)
GFR calc Af Amer: >60 mL/min (ref >60)
GLUCOSE: 95 mg/dL (ref 65–99)
Potassium: 4.5 mmol/L (ref 3.5–5.1)
Sodium: 131 mmol/L — ABNORMAL LOW (ref 135–145)

## 2017-08-27 MED ORDER — FIDAXOMICIN 200 MG PO TABS: 200.0000 mg | ORAL_TABLET | Freq: Two times a day (BID) | ORAL | 0 refills | Status: DC

## 2017-08-27 MED ORDER — CEPHALEXIN 500 MG PO CAPS: 500.0000 mg | ORAL_CAPSULE | Freq: Three times a day (TID) | ORAL | 0 refills | Status: AC

## 2017-08-27 MED ORDER — OXYCODONE HCL 5 MG PO TABS: 2.5000 mg | ORAL_TABLET | Freq: Three times a day (TID) | ORAL | Status: DC | PRN

## 2017-08-27 MED ORDER — GABAPENTIN 100 MG PO CAPS: 100.0000 mg | ORAL_CAPSULE | Freq: Every day | ORAL | 0 refills | Status: DC

## 2017-08-27 MED ORDER — OXYCODONE HCL 5 MG PO TABS
2.5000 mg | ORAL_TABLET | Freq: Three times a day (TID) | ORAL | Status: DC | PRN
  Administered 2017-08-27: 2.5 mg via ORAL
  Filled 2017-08-27: qty 1

## 2017-08-27 MED ORDER — FIDAXOMICIN 200 MG PO TABS: 200.0000 mg | ORAL_TABLET | Freq: Two times a day (BID) | ORAL | 0 refills | Status: AC

## 2017-08-27 MED ORDER — ASPIRIN 81 MG PO TBEC: 81.0000 mg | DELAYED_RELEASE_TABLET | Freq: Every day | ORAL | 0 refills | Status: DC

## 2017-08-27 MED ORDER — CEPHALEXIN 500 MG PO CAPS: 500.0000 mg | ORAL_CAPSULE | Freq: Three times a day (TID) | ORAL | 0 refills | Status: DC

## 2017-08-27 MED FILL — CEPHALEXIN 500 MG CAPSULE: 500 | 10 days supply | Qty: 32 | Fill #0

## 2017-08-27 MED FILL — GABAPENTIN 100 MG CAPSULE: 100 | 15 days supply | Qty: 15 | Fill #0

## 2017-08-27 NOTE — Progress Notes (Signed)
CSW received consult to provide substance use resources to patient. CSW met with patient at bedside. Patient alert and oriented and sitting up eating lunch. Patient was agreeable to take list of resources. CSW provided list of inpatient and outpatient substance use treatment programs, as well as information on [facility] program.   RNCM following for home health and medication needs. CSW signing off.  Estanislado Emms, [place]

## 2017-08-27 NOTE — Progress Notes (Signed)
Progress Note    08/27/17 1816  PT Visit Information  Last PT Received On 08/27/17  Assistance Needed +2  History of Present Illness Kern ReapBryon T Illes is a 52 y.o. male presenting with worsening SOB. 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   Had 1.4 L fluid removed from R lung via thoracentesis.  Dx w/ HCAP. Now with C. diff infection.   Subjective Data  Subjective pt/Mom related they felt much better after the PT/OT session today.  Patient Stated Goal get stronger  Precautions  Precautions Fall  Pain Assessment  Pain Assessment Faces  Faces Pain Scale 6  Pain Location L knee and back  Pain Descriptors / Indicators Grimacing;Guarding  Pain Intervention(s) Monitored during session  Cognition  Arousal/Alertness Awake/alert  Behavior During Therapy Flat affect  Overall Cognitive Status Within Functional Limits for tasks assessed (not tested formally)  Transfers  Overall transfer level Needs assistance  Equipment used 1 person hand held assist  Transfers Stand Pivot Transfers  Sit to Stand Mod assist;+2 safety/equipment  Stand pivot transfers Mod assist;+2 safety/equipment  General transfer comment cued pt and Mom for best technique to get in/out of the car.  Assisted pt from w/c to the front seat of the car, problem solving the easiest way for pt to assist the most.  Balance  Sitting balance-Leahy Scale Fair  Standing balance-Leahy Scale Poor  General Comments  General comments (skin integrity, edema, etc.) worked of safe transfer technique for pt and his Mom   PT - Assessment/Plan  PT Plan Current plan remains appropriate (pt is in the car for d/c home)  PT Visit Diagnosis Other abnormalities of gait and mobility (R26.89);Muscle weakness (generalized) (M62.81);Difficulty in walking, not elsewhere classified (R26.2);Hemiplegia and hemiparesis  Hemiplegia - Right/Left Right  Hemiplegia - caused by  Cerebral infarction  PT Frequency (ACUTE ONLY) Min 3X/week  Follow Up Recommendations Home health PT  PT equipment Rolling walker with 5" wheels  AM-PAC PT "6 Clicks" Daily Activity Outcome Measure  Difficulty turning over in bed (including adjusting bedclothes, sheets and blankets)? 1  Difficulty moving from lying on back to sitting on the side of the bed?  1  Difficulty sitting down on and standing up from a chair with arms (e.g., wheelchair, bedside commode, etc,.)? 1  Help needed moving to and from a bed to chair (including a wheelchair)? 3  Help needed walking in hospital room? 2  Help needed climbing 3-5 steps with a railing?  1  6 Click Score 9  Mobility G Code  CL  PT Goal Progression  Progress towards PT goals Progressing toward goals  Acute Rehab PT Goals  PT Goal Formulation With patient  Time For Goal Achievement 09/08/17  Potential to Achieve Goals Fair  PT Time Calculation  PT Start Time (ACUTE ONLY) 1550  PT Stop Time (ACUTE ONLY) 1605  PT Time Calculation (min) (ACUTE ONLY) 15 min  PT General Charges  $$ ACUTE PT VISIT 1 Visit  PT Treatments  $Therapeutic Activity 8-22 mins   08/27/2017  [place] BingKen Marina Boerner, PT [phone]  (pager)

## 2017-08-27 NOTE — Progress Notes (Signed)
Patient received discharge information and acknowledged understanding of it. Patient IVs were removed.  

## 2017-08-27 NOTE — Progress Notes (Addendum)
Pre-op Cardiac Surgery  Carotid Findings:  1-39% stenosis.  Patient refused to do the limb Doppler, attempted to approach, but patient was discharged and ready to go home per RN.  Voice mail left for [employer] Brooks with [facility].   Upper Extremity Right Left  Brachial Pressures    Radial Waveforms    Ulnar Waveforms    Palmar Arch (Allen's Test)     Findings:      Lower  Extremity Right Left  Dorsalis Pedis    Anterior Tibial    Posterior Tibial    Ankle/Brachial Indices      Findings:

## 2017-08-27 NOTE — Progress Notes (Signed)
PT progress Note  Spent extra time with pt and his Mom to educate on transfer technique, bed transitions, easy ways to help with ADL's as needed and make sure appropriate equipment was attained.    08/27/17 1810  PT Visit Information  Last PT Received On 08/27/17  Assistance Needed +2  PT/OT/SLP Co-Evaluation/Treatment Yes  Reason for Co-Treatment For patient/therapist safety  PT goals addressed during session Mobility/safety with mobility  History of Present Illness Marcus [name] is a 52 y.o. male presenting with worsening SOB. 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   Had 1.4 L fluid removed from R lung via thoracentesis.  Dx w/ HCAP. Now with C. diff infection.   Subjective Data  Patient Stated Goal get stronger  Precautions  Precautions Fall  Pain Assessment  Pain Assessment Faces  Faces Pain Scale 6  Pain Location L knee and back  Pain Descriptors / Indicators Grimacing;Guarding  Pain Intervention(s) Monitored during session  Cognition  Arousal/Alertness Awake/alert  Behavior During Therapy Flat affect  Overall Cognitive Status No family/caregiver present to determine baseline cognitive functioning  Area of Impairment Problem solving;Awareness  Orientation Level Disoriented to;Situation;Time  Current Attention Level Selective  Following Commands Follows one step commands with increased time  Safety/Judgement Decreased awareness of safety  Awareness Emergent  Problem Solving Slow processing  General Comments Increased time for processing with decreased awareness. Demonstrating improving motivation.   Bed Mobility  Overal bed mobility Needs Assistance  Bed Mobility Supine to Sit  Rolling Min guard  Sidelying to sit Min guard  General bed mobility comments Min guard assist to get to EOB this session. Increased time and effort.   Transfers  Overall transfer level Needs assistance   Equipment used Rolling walker (2 wheeled);None  Transfers Squat Pivot Transfers;Sit to/from Stand  Sit to Stand Mod assist;+2 physical assistance  Squat pivot transfers Min assist  General transfer comment Able to power up to standing with RW with mod assist +2. Min assist for squat-pivot for simulated toilet and w/c transfers. Extensive education given to mother concerning methods to assist.   Ambulation/Gait  General Gait Details pt unable today after a week of being in bed.  Balance  Overall balance assessment Needs assistance  Sitting-balance support No upper extremity supported;Feet supported  Sitting balance-Leahy Scale Fair  Sitting balance - Comments can maintain sitting balance with UE assist and short periods without assist  Standing balance support Bilateral upper extremity supported  Standing balance-Leahy Scale Zero  Standing balance comment significant assist from therapists and RW to maintain stance  General Comments  General comments (skin integrity, edema, etc.) Worked mostly on family education with Mom and pt emphasizing transfer safety and technique  PT - End of Session  Equipment Utilized During Treatment Gait belt  Activity Tolerance Patient tolerated treatment well   PT - Assessment/Plan  PT Plan Discharge plan needs to be updated  PT Visit Diagnosis Other abnormalities of gait and mobility (R26.89);Muscle weakness (generalized) (M62.81);Difficulty in walking, not elsewhere classified (R26.2);Hemiplegia and hemiparesis  Hemiplegia - Right/Left Right  Hemiplegia - caused by Cerebral infarction  PT Frequency (ACUTE ONLY) Min 3X/week  Follow Up Recommendations Home health PT  PT equipment Rolling walker with 5" wheels  AM-PAC PT "6 Clicks" Daily Activity Outcome Measure  Difficulty turning over in bed (including adjusting bedclothes, sheets and blankets)? 1  Difficulty moving from lying on back to sitting on the side of the bed?  1  Difficulty sitting down on and  standing up from a chair with arms (e.g., wheelchair, bedside commode, etc,.)? 1  Help needed moving to and from a bed to chair (including a wheelchair)? 3  Help needed walking in hospital room? 2  Help needed climbing 3-5 steps with a railing?  1  6 Click Score 9  Mobility G Code  CL  PT Goal Progression  Progress towards PT goals Progressing toward goals  Acute Rehab PT Goals  PT Goal Formulation With patient  Time For Goal Achievement 09/08/17  Potential to Achieve Goals Fair  PT Time Calculation  PT Start Time (ACUTE ONLY) 1441  PT Stop Time (ACUTE ONLY) 1525  PT Time Calculation (min) (ACUTE ONLY) 44 min  PT General Charges  $$ ACUTE PT VISIT 1 Visit  PT Treatments  $Therapeutic Activity 8-22 mins   08/27/2017  [place] Bing, PT [phone]  (pager)

## 2017-08-27 NOTE — Progress Notes (Addendum)
Spoke to [facility] outpatient office. Per [employer] operational officersecretary, patient will be contacted by office to schedule follow up.  Spoke to Dr. Orvan Falconerampbell, ID, who informed that he will set up outpatient follow up for hepatitis C and to follow up after antibiotics are completed.   Orpah ClintonSherin Senna Lape, DO, PGY-1 [facility] 08/27/2017 2:16 PM

## 2017-08-27 NOTE — Progress Notes (Addendum)
Family Medicine Teaching Service Daily Progress Note Intern Pager: [phone]  Patient name: Marcus [name] Medical record number: [id number] Date of birth: 1965/07/29 Age: 52 y.o. Gender: male  Primary Care Provider: Patient, No Pcp Per Consultants: CCM, ID, Cardiology, CVTS, Dental  Code Status: Full   Pt Overview and Major Events to Date:  3/09: Admit forsepsis thought to be secondary to HAP and new onsetHF,cultures obtained, broad-spectrum antibiotics initiated 3/10: PCCM consulted,tolerating BiPAP 3/10: thoracentesis 3/14: TEE with severe MR, CVTS consulted 3/20: C diff positive - oral vanc startred 3/21: full dental extraction  Assessment and Plan: Marcus [name] a 52 y.o.malepresenting with worsening SOB. 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  Endocarditis of mitral valve h/oembolic strokeand metabolic encephalopathy new onset heart failure Patient does have signs concerning for new onset heart failure 2/2 vavular dysfunction.Leukocytosisimproved today 13.2>12.2 -cardiology consulted, appreciate recommendations: low dose ASA -daily weight -strict Is and Os -CVTS consulted, appreciate recommendations: not candidate for surgery until colitis is completely resolved  -ID following: change cefazolin to oral cephalexin (3/26-) and treat for 11 days, continue fidaxomicin 18 more days -Dentistry consulted,appreciate recommendations: s/p full extraction3/21 -continue cephalexin (3/26-) for total of 10 more days   Osteomyelitis MRI on spine on 08/19/17 showing T7-T8 discitis-osteomyelitis without abscess and C6-7 suspicious for discitis-osteomyelitis without abscess. Mild spinal stenosis of T7-8 also seen.  -ID following, appreciate recommendations   Anemia Chronic, stable.Current Hgb8.5. Appears to be chronically low with baseline of 8-9. Normocytic  -continue to monitor  with daily CBC  Left knee edema h/o left MSSA septic arthritis Presented with lower extremity edema. Does have recent septic arthritis of the knee.DVT r/o with LE dopplers. -continue to monitor   Hepatitis C genotype 2B Noted during previous hospitalization. No prior treatment. Recent viral load 224K.LFTsstable.  -ID consulted, appreciate recommendations: recommended outpatient tx   Compression deformities of mid to lower thoracic spine Patientreports history of fall back in January 2019he continues to have persistent low back pain since discharge. Patient reports use of OxyContin and oxycodone for pain control though no medications per chart review. PT recommending SNF.  -continuepercocet 5-325 q6H scheduled, wean as tolerated -MRI spineunder general anesthesia  Polysubstance abuse UDS positive for barbiturates and opiates on presentation. Also has history of cocaine back in January 2019. Has known endocarditis likely from IV drug use though patient reports no recentillicit drug use.  -Continue onCIWA:0overnight -Daily folate and thiamine -monitor COWS score: 2>3>1 -oxycodone 2.5mg  q8h, can plan to wean as tolerated -will consult CSW for resources on outpatient rehabilitation   Hyponatremia Improving.Current Nastable/low [id number].Likely 2/2 poor oral intake as patient has been NPO on and off for several days and likely had poor oral intake prior to admission. -monitor on daily BMP -encourage POintake  Hypokalemia-resolved K of4.5 -replete with KDur as needed -monitor on daily BMP  Protein calorie malnutrition  Albumin of 2.3 on 3/16. Likely 2/2 poor oral intake -consult nutrition, appreciate recommendations  C-diff-improving  Reports no diarrhea today -enteric precautions -ID consulted, appreciate recommendations: continue dificid for 18 days  Disposition Will be discharged home with home health. Dificid patient assistance was approved  and was delivered to patient's home.   FEN/GI:soft diet  GEX:[id number]  Disposition: discharge with home health   Subjective:  Patient today states she feels much better. States no diarrhea today and only one episode of diarrhea yesterday. States he was able to rest well. Very appreciative of care.  Objective: Temp:  [97.9 F (36.6 C)-98 F (36.7 C)] 97.9 F (36.6 C) (03/27 0634) Pulse Rate:  [91-94] 91 (03/27 0634) Resp:  [14-21] 14 (03/27 0634) BP: (92-99)/(47-70) 98/70 (03/27 0634) SpO2:  [97 %-98 %] 98 % (03/27 0634) Weight:  [131 lb 13.4 oz (59.8 kg)] 131 lb 13.4 oz (59.8 kg) (03/27 [id number]) Physical Exam: General: awake and alert, laying in bed, NAD  Cardiovascular: RRR, grade 2 systolic murmur  Respiratory: CTAB, no increased work of breathing, no wheezes, rales, or rhonchi, speaking full sentences  Abdomen: soft, non tender, non distended, bowel sounds normal  Extremities: non tender, no edema   Laboratory: Recent Labs  Lab 08/25/17 0428 08/26/17 0408 08/27/17 0511  WBC 13.7* 13.2* 12.2*  HGB 8.7* 8.6* 8.5*  HCT 27.3* 26.9* 26.3*  PLT 328 363 375   Recent Labs  Lab 08/25/17 0428 08/26/17 0408 08/27/17 0511  NA 127* 129* 131*  K 4.0 4.3 4.5  CL 92* 95* 99*  CO2 26 25 25   BUN 14 14 15   CREATININE 0.86 0.82 0.83  CALCIUM 8.0* 8.2* 8.3*  GLUCOSE 92 95 95    Imaging/Diagnostic Tests: Dg Orthopantogram  Result Date: 08/15/2017 CLINICAL DATA:  Poor dentition.  Cardiac workup. EXAM: ORTHOPANTOGRAM/PANORAMIC COMPARISON:  None. FINDINGS: There are numerous missing maxillary and mandibular teeth, and multiple remaining maxillary teeth appear broken. There is mild periapical lucency associated with a left maxillary premolar tooth. IMPRESSION: Poor dentition with multiple missing and broken teeth. Mild periapical lucency associated with a left maxillary premolar tooth, abscess not excluded. Electronically Signed   By: Sebastian AcheAllen  Grady M.D.   On: 08/15/2017 09:12   Dg  Chest 2 View  Result Date: 08/23/2017 CLINICAL DATA:  Bilateral pleural effusions EXAM: CHEST - 2 VIEW COMPARISON:  08/14/2017 the FINDINGS: Moderate right pleural effusion is similar to prior. Tiny left pleural effusion noted. Airspace opacity in the right base has decreased in the interval. Cardiopericardial silhouette is at upper limits of normal for size. The visualized bony structures of the thorax are intact. Telemetry leads overlie the chest. IMPRESSION: Interval decrease and airspace disease at the right base with similar appearance of moderate right and small left pleural effusions. Electronically Signed   By: Kennith CenterEric  Mansell M.D.   On: 08/23/2017 16:41   Dg Chest 2 View  Result Date: 08/14/2017 CLINICAL DATA:  Followup pneumonia. EXAM: CHEST - 2 VIEW COMPARISON:  08/10/2016 and older studies. FINDINGS: Moderate right and small left pleural effusions. There is associated lung base opacity, greater on the right, which is consistent with atelectasis, pneumonia or a combination. The patchy airspace opacity noted along the perihilar left lung on the prior study has improved. Airspace opacity in the right mid to lower lung appears somewhat less confluent but is unchanged in extent. No new lung abnormalities.  No pneumothorax. IMPRESSION: 1. Mild improvement in lung consolidation compared to the prior exam. 2. Persistent moderate right and small left pleural effusions. No new abnormalities. Electronically Signed   By: Amie Portlandavid  Ormond M.D.   On: 08/14/2017 13:16   Dg Chest 2 View  Addendum Date: 08/09/2017   ADDENDUM REPORT: 08/09/2017 15:41 ADDENDUM: After further review, there are compression deformities of 2 adjacent vertebral bodies in the mid to lower thoracic spine, of uncertain age but new compared to an older chest x-ray 02/15/2005. Would consider further characterization with CT. These results were called by telephone at the time of interpretation on 08/09/2017 at 3:40 pm to Dr. Doug SouSAM JACUBOWITZ , who  verbally acknowledged these results. Electronically Signed   By: Bary Richard M.D.   On: 08/09/2017 15:41   Result Date: 08/09/2017 CLINICAL DATA:  Shortness of breath for 2 days. Bacterial meningitis diagnosed at the beginning of January, than recently diagnosed with pneumonia. History of TIA in January with left-sided weakness. Former smoker. EXAM: CHEST - 2 VIEW COMPARISON:  Chest x-ray dated 06/08/2017. FINDINGS: New dense opacity at the right lung base, likely a combination of consolidation and pleural effusion. Pleural effusion component is at least moderate in size. Patchy opacities are seen within the left perihilar and lower lung zones, favor edema. Additional opacity at the left lung base is likely a combination of atelectasis and small pleural effusion. Heart size and mediastinal contours are stable. Osseous structures about the chest are unremarkable. IMPRESSION: 1. New dense opacity at the right lung base, likely a combination of pneumonia or atelectasis and pleural effusion. The pleural effusion component is at least moderate in size. Would consider chest CT for further characterization. 2. Additional patchy ill-defined opacities within the left mid and lower lung, most likely edema, pneumonia considered less likely. 3. Probable mild atelectasis and/or small pleural effusion at the left lung base. Electronically Signed: By: Bary Richard M.D. On: 08/09/2017 15:29   Ct Angio Chest Pe W And/or Wo Contrast  Result Date: 08/09/2017 CLINICAL DATA:  SOB x2 days. Pt diagnosed with bacterial meningitis at the beginning of January, then recently diagnosed with pneumonia. Pt also has hx of TIA beginning of January that left him with left sided weakness. Patient had left knee surgery x 6 weeks ago for sepsis. EXAM: CT ANGIOGRAPHY CHEST WITH CONTRAST TECHNIQUE: Multidetector CT imaging of the chest was performed using the standard protocol during bolus administration of intravenous contrast. Multiplanar CT  image reconstructions and MIPs were obtained to evaluate the vascular anatomy. CONTRAST:  ISOVUE-370 IOPAMIDOL (ISOVUE-370) INJECTION 76% COMPARISON:  Current chest radiographs and prior studies. FINDINGS: Cardiovascular: Satisfactory opacification of the pulmonary arteries to the segmental level. No evidence of pulmonary embolism. Heart is normal in size and configuration. No pericardial effusion. No coronary artery calcifications. The great vessels normal in caliber. No aortic atherosclerosis. No dissection. Mediastinum/Nodes: No neck base or axillary masses or pathologically enlarged lymph nodes. No mediastinal or hilar masses or discrete enlarged lymph nodes. The trachea is patent and normal in caliber. Esophagus is unremarkable. Lungs/Pleura: Large right and small left pleural effusions. Complete atelectasis of the right middle lobe and near complete atelectasis of the right lower lobe. Small area of consolidation in the anterior inferior right upper lobe. Patchy consolidation is noted in a peribronchovascular distribution in the left upper lobe and, to lesser degree, left lower lobe. There is dependent opacity also in the left lower lobe consistent with atelectasis. No pneumothorax. Upper Abdomen: No acute abnormality. Musculoskeletal: There is marked narrowing of the T6-T7 disc space with resorption/irregularity of the lower endplate of T6 in upper endplate of T7, with mild loss of height of these vertebra. This leads to a mild focal kyphosis. This is new since a chest radiograph dated 02/15/2005. A subtle lucency across the T6 spinous process posteriorly suggests a previous fracture. All remaining vertebral bodies are normal in height. No osteoblastic or osteolytic lesions. Review of the MIP images confirms the above findings. IMPRESSION: 1. Patchy areas of consolidation in both lungs consistent with multifocal pneumonia. 2. Complete atelectasis of the right middle lobe and near complete atelectasis of  the right lower lobe. Mild dependent atelectasis in  the left lower lobe. 3. Large right and small left pleural effusions. 4. Marked loss of the disc space at T6-C7 with irregular resorption of endplates leading to loss of vertebral body height of T6 and T7 and a focal kyphosis. This may reflect the sequelae of an old fracture. It could be due to previous discitis/osteomyelitis. Electronically Signed   By: Amie Portland M.D.   On: 08/09/2017 18:44   Mr Brain Wo Contrast  Result Date: 08/14/2017 CLINICAL DATA:  52 y/o  M; stroke follow-up. EXAM: MRI HEAD WITHOUT CONTRAST TECHNIQUE: Axial DWI sequence was acquired. Patient combative, unable to continue examination. COMPARISON:  06/09/2017 MRI head.  06/08/2017 CT head. FINDINGS: Low B value DWI sequence demonstrates areas of T2 hyperintensity in the right greater than left frontal, parietal, and occipital lobes as well as splenium of corpus callosum and cerebellum corresponding to infarctions on prior MRI. The high B value sequence is motion degraded, insufficient to assess for reduced diffusion and acute stroke. IMPRESSION: Severe motion degraded DWI sequence insufficient to assess for acute stroke. Repeat imaging is recommended when patient is able to hold still and follow commands. Electronically Signed   By: Mitzi Hansen M.D.   On: 08/14/2017 21:38   Mr Laqueta Jean ZO Contrast  Result Date: 08/19/2017 CLINICAL DATA:  Hospitalized in January with bacteremia, mitral valve endocarditis, CNS septic emboli, and thoracic spine discitis. Persistent back pain. EXAM: MRI HEAD WITHOUT AND WITH CONTRAST MRI THORACIC SPINE WITHOUT AND WITH CONTRAST MRI LUMBAR SPINE WITHOUT AND WITH CONTRAST TECHNIQUE: Multiplanar, multiecho pulse sequences of the brain and surrounding structures, and cervical spine, to include the craniocervical junction and cervicothoracic junction, were obtained without and with intravenous contrast. Multiplanar and multiecho pulse sequences  of the thoracic and lumbar spine were obtained without and with intravenous contrast. CONTRAST:  9mL MULTIHANCE GADOBENATE DIMEGLUMINE 529 MG/ML IV SOLN COMPARISON:  Brain MRI 06/09/2017. CTA chest, abdomen, and pelvis 08/14/2017. Lumbar spine CT 12/30/2015. FINDINGS: MRI HEAD FINDINGS Brain: Embolic infarcts throughout the brain on the prior MRI demonstrate interval evolution. Associated diffusion abnormality has decreased though not completely resolved in some of the areas of infarction, and there is developing encephalomalacia. Associated chronic blood products are present with the majority of the infarcts. Multiple infarcts demonstrate enhancement. No definite restricted diffusion is seen to clearly indicate acute infarction. There are a few subcentimeter infarcts in the left centrum semiovale which are new from the prior MRI and demonstrate mild trace diffusion abnormality without reduced ADC, consistent with interval subacute infarcts. No intracranial mass, midline shift, or extra-axial fluid collection is seen. There is mild cerebral atrophy. No abnormal meningeal enhancement is identified. Vascular: Major intracranial vascular flow voids are preserved. Skull and upper cervical spine: Unremarkable bone marrow signal. Sinuses/Orbits: Unremarkable orbits. Clear paranasal sinuses. Trace left mastoid fluid. Other: None. MRI THORACIC SPINE FINDINGS Spinal numbering is performed by counting down from the craniocervical junction. This results in different numbering than what was reported on the prior CTA due to transitional lumbosacral anatomy. Some sequences are up to moderately motion degraded. Alignment: Slight focal kyphosis at T7-8.  No listhesis. Vertebrae: Disc and endplate destruction at T7-8 as seen on prior CT with diffuse enhancement throughout the residual disc space and marrow of both T7 and T8 vertebral bodies extending into the pedicles. Minimal dorsal epidural enhancement at T7-8 without evidence of  epidural abscess. Partially visualized marrow edema and enhancement in the C6 and C7 vertebral bodies with endplate irregularity, disc space narrowing, and enhancement  anteriorly in the disc space. No evidence of C6-7 epidural abscess on limited sagittal imaging. Very mild endplate edema and enhancement at T11-12 favored to be degenerative given lack of disc abnormality or endplate erosion to suggest infection. Background diminished T1 bone marrow signal intensity diffusely is likely related to patient's known anemia. 1.4 cm enhancing lesion in the posterior elements on the right at T3 is consistent with an hemangioma. A smaller atypical hemangioma is also suspected in the right T2 pedicle. Cord:  Normal signal. Paraspinal and other soft tissues: Paravertebral soft tissue edema/phlegmon from T6-T8. Interspinous soft tissue edema and enhancement at T7-8. No paraspinal abscess identified. Partially visualized prevertebral inflammation in the lower cervical spine. Moderate right and small left pleural effusions with associated compressive atelectasis on the right. Small volume fluid in the esophagus. Partially visualized endotracheal tube and tracheal secretions. Disc levels: Moderate disc space narrowing, disc bulging, and endplate spurring at C7-T1 result in moderate right and mild-to-moderate left neural foraminal stenosis and at most mild spinal stenosis. Broad-based posterior disc osteophyte complex and facet hypertrophy at T7-8 result in mild spinal stenosis without cord compression. Mild disc bulging and scattered tiny disc protrusions elsewhere in the thoracic spine do not result in spinal stenosis. MRI LUMBAR SPINE FINDINGS Some sequences are mildly motion degraded. Segmentation: Transitional lumbosacral anatomy with a lumbarized S1. Alignment: Facet mediated anterolisthesis of L5 on S1 measuring 4 mm. Vertebrae: Diffusely diminished T1 bone marrow signal intensity likely related to anemia. Predominantly type  2 degenerative endplate changes at S1-2. Mild type 1 changes at L3-4. No evidence of infectious discitis-osteomyelitis. No fracture. Conus medullaris: Extends to the L2 level and appears normal. Paraspinal and other soft tissues: Mild nonspecific posterior paraspinal muscle edema bilaterally in the mid and lower lumbar spine. No fluid collection. Disc levels: L1-2: Negative. L2-3: Minimal disc bulging without stenosis. L3-4: Mild disc space narrowing. Mild disc bulging and mild facet and ligamentum flavum hypertrophy result in mild bilateral lateral recess stenosis and mild left neural foraminal stenosis without significant spinal stenosis. L4-5: Mild disc space narrowing. Mild disc bulging asymmetric to the right and moderate right and mild left facet hypertrophy result in mild right lateral recess stenosis and mild right neural foraminal stenosis without significant spinal stenosis. L5-S1: Anterolisthesis with disc uncovering and severe facet hypertrophy result in mild spinal stenosis, mild-to-moderate bilateral lateral recess stenosis, and mild-to-moderate bilateral neural foraminal stenosis. S1-2: Transitional level with disc space narrowing. Mild disc bulging and moderate facet arthrosis result in mild left neural foraminal stenosis without spinal stenosis. IMPRESSION: 1. 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 2. Interval tiny subacute infarcts in the left centrum semiovale. No acute infarct. 3. Transitional lumbosacral anatomy as above. 4. 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 No epidural or paraspinal abscess. 5. Partially visualized disc and endplate enhancement at C6-7 suspicious for discitis-osteomyelitis with prevertebral inflammation. No evidence of epidural abscess though this was incompletely imaged. 6. Mild spinal stenosis at T7-8. 7. No evidence of discitis-osteomyelitis in the lumbar spine. 8. Nonspecific bilateral lumbar posterior  paraspinal muscle edema without abscess. 9. Lumbar disc and facet degeneration most notable at L5-S1 where there is grade 1 anterolisthesis, mild spinal stenosis, and mild-to-moderate lateral recess and neural foraminal stenosis. 10. Moderate right and small left pleural effusions. Electronically Signed   By: Sebastian Ache M.D.   On: 08/19/2017 15:55   Mr Thoracic Spine W Wo Contrast  Result Date: 08/19/2017 CLINICAL DATA:  Hospitalized in January with bacteremia, mitral valve  endocarditis, CNS septic emboli, and thoracic spine discitis. Persistent back pain. EXAM: MRI HEAD WITHOUT AND WITH CONTRAST MRI THORACIC SPINE WITHOUT AND WITH CONTRAST MRI LUMBAR SPINE WITHOUT AND WITH CONTRAST TECHNIQUE: Multiplanar, multiecho pulse sequences of the brain and surrounding structures, and cervical spine, to include the craniocervical junction and cervicothoracic junction, were obtained without and with intravenous contrast. Multiplanar and multiecho pulse sequences of the thoracic and lumbar spine were obtained without and with intravenous contrast. CONTRAST:  9mL MULTIHANCE GADOBENATE DIMEGLUMINE 529 MG/ML IV SOLN COMPARISON:  Brain MRI 06/09/2017. CTA chest, abdomen, and pelvis 08/14/2017. Lumbar spine CT 12/30/2015. FINDINGS: MRI HEAD FINDINGS Brain: Embolic infarcts throughout the brain on the prior MRI demonstrate interval evolution. Associated diffusion abnormality has decreased though not completely resolved in some of the areas of infarction, and there is developing encephalomalacia. Associated chronic blood products are present with the majority of the infarcts. Multiple infarcts demonstrate enhancement. No definite restricted diffusion is seen to clearly indicate acute infarction. There are a few subcentimeter infarcts in the left centrum semiovale which are new from the prior MRI and demonstrate mild trace diffusion abnormality without reduced ADC, consistent with interval subacute infarcts. No intracranial  mass, midline shift, or extra-axial fluid collection is seen. There is mild cerebral atrophy. No abnormal meningeal enhancement is identified. Vascular: Major intracranial vascular flow voids are preserved. Skull and upper cervical spine: Unremarkable bone marrow signal. Sinuses/Orbits: Unremarkable orbits. Clear paranasal sinuses. Trace left mastoid fluid. Other: None. MRI THORACIC SPINE FINDINGS Spinal numbering is performed by counting down from the craniocervical junction. This results in different numbering than what was reported on the prior CTA due to transitional lumbosacral anatomy. Some sequences are up to moderately motion degraded. Alignment: Slight focal kyphosis at T7-8.  No listhesis. Vertebrae: Disc and endplate destruction at T7-8 as seen on prior CT with diffuse enhancement throughout the residual disc space and marrow of both T7 and T8 vertebral bodies extending into the pedicles. Minimal dorsal epidural enhancement at T7-8 without evidence of epidural abscess. Partially visualized marrow edema and enhancement in the C6 and C7 vertebral bodies with endplate irregularity, disc space narrowing, and enhancement anteriorly in the disc space. No evidence of C6-7 epidural abscess on limited sagittal imaging. Very mild endplate edema and enhancement at T11-12 favored to be degenerative given lack of disc abnormality or endplate erosion to suggest infection. Background diminished T1 bone marrow signal intensity diffusely is likely related to patient's known anemia. 1.4 cm enhancing lesion in the posterior elements on the right at T3 is consistent with an hemangioma. A smaller atypical hemangioma is also suspected in the right T2 pedicle. Cord:  Normal signal. Paraspinal and other soft tissues: Paravertebral soft tissue edema/phlegmon from T6-T8. Interspinous soft tissue edema and enhancement at T7-8. No paraspinal abscess identified. Partially visualized prevertebral inflammation in the lower cervical  spine. Moderate right and small left pleural effusions with associated compressive atelectasis on the right. Small volume fluid in the esophagus. Partially visualized endotracheal tube and tracheal secretions. Disc levels: Moderate disc space narrowing, disc bulging, and endplate spurring at C7-T1 result in moderate right and mild-to-moderate left neural foraminal stenosis and at most mild spinal stenosis. Broad-based posterior disc osteophyte complex and facet hypertrophy at T7-8 result in mild spinal stenosis without cord compression. Mild disc bulging and scattered tiny disc protrusions elsewhere in the thoracic spine do not result in spinal stenosis. MRI LUMBAR SPINE FINDINGS Some sequences are mildly motion degraded. Segmentation: Transitional lumbosacral anatomy with a lumbarized S1. Alignment:  Facet mediated anterolisthesis of L5 on S1 measuring 4 mm. Vertebrae: Diffusely diminished T1 bone marrow signal intensity likely related to anemia. Predominantly type 2 degenerative endplate changes at S1-2. Mild type 1 changes at L3-4. No evidence of infectious discitis-osteomyelitis. No fracture. Conus medullaris: Extends to the L2 level and appears normal. Paraspinal and other soft tissues: Mild nonspecific posterior paraspinal muscle edema bilaterally in the mid and lower lumbar spine. No fluid collection. Disc levels: L1-2: Negative. L2-3: Minimal disc bulging without stenosis. L3-4: Mild disc space narrowing. Mild disc bulging and mild facet and ligamentum flavum hypertrophy result in mild bilateral lateral recess stenosis and mild left neural foraminal stenosis without significant spinal stenosis. L4-5: Mild disc space narrowing. Mild disc bulging asymmetric to the right and moderate right and mild left facet hypertrophy result in mild right lateral recess stenosis and mild right neural foraminal stenosis without significant spinal stenosis. L5-S1: Anterolisthesis with disc uncovering and severe facet  hypertrophy result in mild spinal stenosis, mild-to-moderate bilateral lateral recess stenosis, and mild-to-moderate bilateral neural foraminal stenosis. S1-2: Transitional level with disc space narrowing. Mild disc bulging and moderate facet arthrosis result in mild left neural foraminal stenosis without spinal stenosis. IMPRESSION: 1. 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 2. Interval tiny subacute infarcts in the left centrum semiovale. No acute infarct. 3. Transitional lumbosacral anatomy as above. 4. 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 No epidural or paraspinal abscess. 5. Partially visualized disc and endplate enhancement at C6-7 suspicious for discitis-osteomyelitis with prevertebral inflammation. No evidence of epidural abscess though this was incompletely imaged. 6. Mild spinal stenosis at T7-8. 7. No evidence of discitis-osteomyelitis in the lumbar spine. 8. Nonspecific bilateral lumbar posterior paraspinal muscle edema without abscess. 9. Lumbar disc and facet degeneration most notable at L5-S1 where there is grade 1 anterolisthesis, mild spinal stenosis, and mild-to-moderate lateral recess and neural foraminal stenosis. 10. Moderate right and small left pleural effusions. Electronically Signed   By: Sebastian Ache M.D.   On: 08/19/2017 15:55   Mr Lumbar Spine W Wo Contrast  Result Date: 08/19/2017 CLINICAL DATA:  Hospitalized in January with bacteremia, mitral valve endocarditis, CNS septic emboli, and thoracic spine discitis. Persistent back pain. EXAM: MRI HEAD WITHOUT AND WITH CONTRAST MRI THORACIC SPINE WITHOUT AND WITH CONTRAST MRI LUMBAR SPINE WITHOUT AND WITH CONTRAST TECHNIQUE: Multiplanar, multiecho pulse sequences of the brain and surrounding structures, and cervical spine, to include the craniocervical junction and cervicothoracic junction, were obtained without and with intravenous contrast. Multiplanar and multiecho  pulse sequences of the thoracic and lumbar spine were obtained without and with intravenous contrast. CONTRAST:  9mL MULTIHANCE GADOBENATE DIMEGLUMINE 529 MG/ML IV SOLN COMPARISON:  Brain MRI 06/09/2017. CTA chest, abdomen, and pelvis 08/14/2017. Lumbar spine CT 12/30/2015. FINDINGS: MRI HEAD FINDINGS Brain: Embolic infarcts throughout the brain on the prior MRI demonstrate interval evolution. Associated diffusion abnormality has decreased though not completely resolved in some of the areas of infarction, and there is developing encephalomalacia. Associated chronic blood products are present with the majority of the infarcts. Multiple infarcts demonstrate enhancement. No definite restricted diffusion is seen to clearly indicate acute infarction. There are a few subcentimeter infarcts in the left centrum semiovale which are new from the prior MRI and demonstrate mild trace diffusion abnormality without reduced ADC, consistent with interval subacute infarcts. No intracranial mass, midline shift, or extra-axial fluid collection is seen. There is mild cerebral atrophy. No abnormal meningeal enhancement is identified. Vascular: Major intracranial vascular flow voids are preserved.  Skull and upper cervical spine: Unremarkable bone marrow signal. Sinuses/Orbits: Unremarkable orbits. Clear paranasal sinuses. Trace left mastoid fluid. Other: None. MRI THORACIC SPINE FINDINGS Spinal numbering is performed by counting down from the craniocervical junction. This results in different numbering than what was reported on the prior CTA due to transitional lumbosacral anatomy. Some sequences are up to moderately motion degraded. Alignment: Slight focal kyphosis at T7-8.  No listhesis. Vertebrae: Disc and endplate destruction at T7-8 as seen on prior CT with diffuse enhancement throughout the residual disc space and marrow of both T7 and T8 vertebral bodies extending into the pedicles. Minimal dorsal epidural enhancement at T7-8  without evidence of epidural abscess. Partially visualized marrow edema and enhancement in the C6 and C7 vertebral bodies with endplate irregularity, disc space narrowing, and enhancement anteriorly in the disc space. No evidence of C6-7 epidural abscess on limited sagittal imaging. Very mild endplate edema and enhancement at T11-12 favored to be degenerative given lack of disc abnormality or endplate erosion to suggest infection. Background diminished T1 bone marrow signal intensity diffusely is likely related to patient's known anemia. 1.4 cm enhancing lesion in the posterior elements on the right at T3 is consistent with an hemangioma. A smaller atypical hemangioma is also suspected in the right T2 pedicle. Cord:  Normal signal. Paraspinal and other soft tissues: Paravertebral soft tissue edema/phlegmon from T6-T8. Interspinous soft tissue edema and enhancement at T7-8. No paraspinal abscess identified. Partially visualized prevertebral inflammation in the lower cervical spine. Moderate right and small left pleural effusions with associated compressive atelectasis on the right. Small volume fluid in the esophagus. Partially visualized endotracheal tube and tracheal secretions. Disc levels: Moderate disc space narrowing, disc bulging, and endplate spurring at C7-T1 result in moderate right and mild-to-moderate left neural foraminal stenosis and at most mild spinal stenosis. Broad-based posterior disc osteophyte complex and facet hypertrophy at T7-8 result in mild spinal stenosis without cord compression. Mild disc bulging and scattered tiny disc protrusions elsewhere in the thoracic spine do not result in spinal stenosis. MRI LUMBAR SPINE FINDINGS Some sequences are mildly motion degraded. Segmentation: Transitional lumbosacral anatomy with a lumbarized S1. Alignment: Facet mediated anterolisthesis of L5 on S1 measuring 4 mm. Vertebrae: Diffusely diminished T1 bone marrow signal intensity likely related to anemia.  Predominantly type 2 degenerative endplate changes at S1-2. Mild type 1 changes at L3-4. No evidence of infectious discitis-osteomyelitis. No fracture. Conus medullaris: Extends to the L2 level and appears normal. Paraspinal and other soft tissues: Mild nonspecific posterior paraspinal muscle edema bilaterally in the mid and lower lumbar spine. No fluid collection. Disc levels: L1-2: Negative. L2-3: Minimal disc bulging without stenosis. L3-4: Mild disc space narrowing. Mild disc bulging and mild facet and ligamentum flavum hypertrophy result in mild bilateral lateral recess stenosis and mild left neural foraminal stenosis without significant spinal stenosis. L4-5: Mild disc space narrowing. Mild disc bulging asymmetric to the right and moderate right and mild left facet hypertrophy result in mild right lateral recess stenosis and mild right neural foraminal stenosis without significant spinal stenosis. L5-S1: Anterolisthesis with disc uncovering and severe facet hypertrophy result in mild spinal stenosis, mild-to-moderate bilateral lateral recess stenosis, and mild-to-moderate bilateral neural foraminal stenosis. S1-2: Transitional level with disc space narrowing. Mild disc bulging and moderate facet arthrosis result in mild left neural foraminal stenosis without spinal stenosis. IMPRESSION: 1. 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 2. Interval tiny subacute infarcts in the left centrum semiovale. No acute infarct. 3. Transitional lumbosacral anatomy  as above. 4. 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 No epidural or paraspinal abscess. 5. Partially visualized disc and endplate enhancement at C6-7 suspicious for discitis-osteomyelitis with prevertebral inflammation. No evidence of epidural abscess though this was incompletely imaged. 6. Mild spinal stenosis at T7-8. 7. No evidence of discitis-osteomyelitis in the lumbar spine. 8. Nonspecific  bilateral lumbar posterior paraspinal muscle edema without abscess. 9. Lumbar disc and facet degeneration most notable at L5-S1 where there is grade 1 anterolisthesis, mild spinal stenosis, and mild-to-moderate lateral recess and neural foraminal stenosis. 10. Moderate right and small left pleural effusions. Electronically Signed   By: Sebastian Ache M.D.   On: 08/19/2017 15:55   Dg Chest Port 1 View  Result Date: 08/10/2017 CLINICAL DATA:  Post right thora, 1.4 L removed EXAM: PORTABLE CHEST 1 VIEW COMPARISON:  Chest x-ray dated 08/09/2017. FINDINGS: Some improvement in aeration at the left lung base. Residual opacity is presumably atelectasis or pneumonia. Ill-defined opacities persist throughout the left lung, consistent with the multifocal pneumonia better demonstrated on chest CT of 08/09/2017. Additional persistent atelectasis at the left lung base. No pneumothorax. Heart size and mediastinal contours appear stable. IMPRESSION: 1. Improved aeration at the right lung base status post thoracentesis. Residual opacity is presumably atelectasis or pneumonia. No pneumothorax seen. 2. Stable patchy opacities throughout the left lung, compatible with the multifocal pneumonia demonstrated on yesterday's chest CT. Electronically Signed   By: Bary Richard M.D.   On: 08/10/2017 14:54   Ct Angio Chest/abd/pel For Dissection W And/or W/wo  Result Date: 08/14/2017 CLINICAL DATA:  Septic arterial embolism EXAM: CT ANGIOGRAPHY CHEST, ABDOMEN AND PELVIS TECHNIQUE: Multidetector CT imaging through the chest, abdomen and pelvis was performed using the standard protocol during bolus administration of intravenous contrast. Multiplanar reconstructed images and MIPs were obtained and reviewed to evaluate the vascular anatomy. CONTRAST:  100 cc ISOVUE-370 IOPAMIDOL (ISOVUE-370) INJECTION 76% COMPARISON:  Chest CT 08/09/2017 FINDINGS: CTA CHEST FINDINGS Cardiovascular: No acute pulmonary embolus. Satisfactory opacification of  the aorta and pulmonary arteries. Normal size heart without pericardial effusion. Normal branch pattern of the great vessels off the arch. No coronary arteriosclerosis. No aortic atherosclerosis. No aneurysm. No aortic dissection. Mediastinum/Nodes: No supraclavicular, axillary, mediastinal nor hilar lymphadenopathy. Trachea is patent and normal caliber without intraluminal abnormality. Esophagus is unremarkable. No thyromegaly is noted. Lungs/Pleura: Bilateral moderate-sized pleural effusions slightly smaller on the right with loculated component. Adjacent compressive atelectasis is noted. Patchy airspace opacities in the left upper lobe are redemonstrated, with slight improvement in aeration since prior though the vast majority of the pulmonary opacity still persists. Musculoskeletal: Redemonstration of marked disc space narrowing C6-7 with endplate irregularity of the inferior endplate of T6 superior endplate of T7 with resultant mild focal kyphosis. Review of the MIP images confirms the above findings. CTA ABDOMEN AND PELVIS FINDINGS VASCULAR Aorta: Normal caliber aorta without aneurysm, dissection, vasculitis or significant stenosis. Celiac: Patent without evidence of aneurysm, dissection, vasculitis or significant stenosis. SMA: Patent without evidence of aneurysm, dissection, vasculitis or significant stenosis. Renals: Single renal arteries bilaterally without acute abnormality. No significant stenosis, dissection or evidence of fibromuscular dysplasia. IMA: Patent Inflow: No aneurysm or dissection. Minimal atherosclerotic plaque in the proximal right common iliac and both distal iliac arteries. No significant stenosis. No dissection or aneurysm. Veins: No obvious venous abnormality within the limitations of this arterial phase study. Review of the MIP images confirms the above findings. NON-VASCULAR Hepatobiliary: No focal liver abnormality is seen. No gallstones, gallbladder wall thickening, or biliary  dilatation. Pancreas: Unremarkable. No pancreatic ductal dilatation or surrounding inflammatory changes. Spleen: Heterogeneous enhancement likely due to timing of contrast bolus. No splenomegaly. Adrenals/Urinary Tract: No adrenal no renal masses. No nephrolithiasis nor obstructive uropathy. No hydroureteronephrosis. Unremarkable bladder. Stomach/Bowel: Nondistended stomach with normal small bowel rotation. Mild fluid-filled distention of small bowel loops with slight mural thickening of jejunum may reflect a mild small bowel enteritis. No mechanical source obstruction is seen. Colon is. Normal appearing appendix. Lymphatic: No abdominopelvic adenopathy. Reproductive: Normal size prostate and seminal vesicles. Other: Small amount of fluid in the left inguinal canal. Musculoskeletal: Plate and screw fixation of the left acetabulum. 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 No acute osseous abnormality. There is lower lumbar degenerative facet arthropathy. Minimal anterolisthesis of L4 on L5. Marked disc space narrowing at L5-S1. Review of the MIP images confirms the above findings. IMPRESSION: Chest CT: 1. No acute pulmonary embolus. 2. No aortic aneurysm or dissection. 3. Bilateral moderate-sized pleural effusions slightly smaller on the right with loculated component redemonstrated. Adjacent compressive atelectasis is seen both lower lobes. 4. Relatively unchanged patchy airspace opacity in the left upper lobe only minimally improved in aeration. 5. Redemonstration of focal kyphosis at T6-7 secondary to endplate irregularities and disc space narrowing suspicious for changes of discitis/osteomyelitis. MRI would be the study of choice for further correlation as to acuity. Abdomen and pelvic CT: 1. No aortic aneurysm or dissection. Patent branch vessels without significant stenosis or focal abnormality. 2. Mild fluid-filled distention of small bowel which transmural thickening suspicious for changes of mild  enteritis. No mechanical bowel obstruction. Electronically Signed   By: Tollie Eth M.D.   On: 08/14/2017 21:21   US Thoracentesis Asp Pleural Space W/img Guide  Result Date: 08/10/2017 INDICATION: Respiratory failure. Large right pleural effusion. Request for diagnostic and therapeutic thoracentesis. EXAM: ULTRASOUND GUIDED RIGHT THORACENTESIS MEDICATIONS: None. COMPLICATIONS: None immediate. PROCEDURE: An ultrasound guided thoracentesis was thoroughly discussed with the patient and questions answered. The benefits, risks, alternatives and complications were also discussed. The patient understands and wishes to proceed with the procedure. Written consent was obtained. Ultrasound was performed to localize and mark an adequate pocket of fluid in the right chest. The area was then prepped and draped in the normal sterile fashion. 1% Lidocaine was used for local anesthesia. Under ultrasound guidance a Safe-T-Centesis catheter was introduced. Thoracentesis was performed. The catheter was removed and a dressing applied. FINDINGS: A total of approximately 1.4 L of pink/blood-tinged fluid was removed. Samples were sent to the laboratory as requested by the clinical team. IMPRESSION: Successful ultrasound guided right thoracentesis yielding 1.4 L of pleural fluid. Read by: Brayton El PA-C Electronically Signed   By: Corlis Leak M.D.   On: 08/10/2017 12:55     Oralia Manis, DO 08/27/2017, 9:47 AM PGY-1, [facility] FPTS Intern pager: [phone], text pages welcome

## 2017-08-27 NOTE — Discharge Instructions (Signed)
You were admitted with shortness of breath and also due to needing continued antibiotics for the infection in the mitral valve of your heart. You improved and were stable for discharge. Please follow up with the cardiothoracic surgery for surgery. It is very important you follow up with your PCP. Case management has said you can follow up with [facility]. Please continue to take the oral cephalexin till 09/06/17. Please continue the fidaxomicin till 09/13/17 for your c diff which causes diarrhea. Hand hygiene is very important with c diff.   Please follow up with infectious disease to treat your hepatitis C.   MOUTH CARE AFTER SURGERY  FACTS:  Ice used in ice bag helps keep the swelling down, and can help lessen the pain.  It is easier to treat pain BEFORE it happens.  Spitting disturbs the clot and may cause bleeding to start again, or to get worse.  Smoking delays healing and can cause complications.  Sharing prescriptions can be dangerous.  Do not take medications not recently prescribed for you.  Antibiotics may stop birth control pills from working.  Use other means of birth control while on antibiotics.  Warm salt water rinses after the first 24 hours will help lessen the swelling:  Use 1/2 teaspoonful of table salt per oz.of water.  DO NOT:  Do not spit.  Do not drink through a straw.  Strongly advised not to smoke, dip snuff or chew tobacco at least for 3 days.  Do not eat sharp or crunchy foods.  Avoid the area of surgery when chewing.  Do not stop your antibiotics before your instructions say to do so.  Do not eat hot foods until bleeding has stopped.  If you need to, let your food cool down to room temperature.  EXPECT:  Some swelling, especially first 2-3 days.  Soreness or discomfort in varying degrees.  Follow your dentist's instructions about how to handle pain before it starts.  Pinkish saliva or light blood in saliva, or on your pillow in the  morning.  This can last around 24 hours.  Bruising inside or outside the mouth.  This may not show up until 2-3 days after surgery.  Don't worry, it will go away in time.  Pieces of "bone" may work themselves loose.  It's OK.  If they bother you, let us know.  WHAT TO DO IMMEDIATELY AFTER SURGERY:  Bite on the gauze with steady pressure for 1-2 hours.  Don't chew on the gauze.  Do not lie down flat.  Raise your head support especially for the first 24 hours.  Apply ice to your face on the side of the surgery.  You may apply it 20 minutes on and a few minutes off.  Ice for 8-12 hours.  You may use ice up to 24 hours.  Before the numbness wears off, take a pain pill as instructed.  Prescription pain medication is not always required.  SWELLING:  Expect swelling for the first couple of days.  It should get better after that.  If swelling increases 3 days or so after surgery; let us know as soon as possible.  FEVER:  Take Tylenol every 4 hours if needed to lower your temperature, especially if it is at 100F or higher.  Drink lots of fluids.  If the fever does not go away, let us know.  BREATHING TROUBLE:  Any unusual difficulty breathing means you have to have someone bring you to the emergency room ASAP  BLEEDING:  Light oozing is expected for 24 hours or so.  Prop head up with pillows  Avoid spitting  Do not confuse bright red fresh flowing blood with lots of saliva colored with a little bit of blood.  If you notice some bleeding, place gauze or a tea bag where it is bleeding and apply CONSTANT pressure by biting down for 1 hour.  Avoid talking during this time.  Do not remove the gauze or tea bag during this hour to "check" the bleeding.  If you notice bright RED bleeding FLOWING out of particular area, and filling the floor of your mouth, put a wad of gauze on that area, bite down firmly and constantly.  Call us immediately.  If we're closed, have someone bring you to  the emergency room.  ORAL HYGIENE:  Brush your teeth as usual after meals and before bedtime.  Use a soft toothbrush around the area of surgery.  DO NOT AVOID BRUSHING.  Otherwise bacteria(germs) will grow and may delay healing or encourage infection.  Since you cannot spit, just gently rinse and let the water flow out of your mouth.  DO NOT SWISH HARD.  EATING:  Cool liquids are a good point to start.  Increase to soft foods as tolerated.  PRESCRIPTIONS:  Follow the directions for your prescriptions exactly as written.  If Dr. Kristin Bruins gave you a narcotic pain medication, do not drive, operate machinery or drink alcohol when on that medication.  QUESTIONS:  Call our office during office hours [phone] or call the Emergency Room at [phone].

## 2017-08-27 NOTE — Progress Notes (Signed)
Occupational Therapy Treatment Patient Details Name: Marcus [name] MRN: [id number] DOB: 1965-09-23 Today's Date: 08/27/2017    History of present illness Marcus [name] is a 52 y.o. male presenting with worsening SOB. 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   Had 1.4 L fluid removed from R lung via thoracentesis.  Dx w/ HCAP. Now with C. diff infection.    OT comments  Focused today's session on caregiver education concerning strategies to assist pt with ADL and mobility once discharged home as well as strategies to maximize his functional independence. Pt able to complete simulated toilet transfers with squat-pivot approach with min assist this session. He additionally was able to complete LB ADL with moderate assistance using lateral leans this session. Pt demonstrating improving motivation to improve with functional independence this session. His mother was present and engaged in session and indicates understanding of all education topics. Pt will need 2-wheeled RW at home but has BSC and lift chair at home. Continue to recommend post-acute rehabilitation to maximize pt's functional independence although note discharge set for home today. Will continue to follow while admitted.   Follow Up Recommendations  CIR;Supervision/Assistance - 24 hour    Equipment Recommendations  Other (comment)(defer to next venue of care)    Recommendations for Other Services      Precautions / Restrictions Precautions Precautions: Fall Precaution Comments: bilat mitt restraints Restrictions Weight Bearing Restrictions: No       Mobility Bed Mobility Overal bed mobility: Needs Assistance Bed Mobility: Supine to Sit Rolling: Min guard Sidelying to sit: Min guard       General bed mobility comments: Min guard assist to get to EOB this session. Increased time and effort.   Transfers Overall transfer level: Needs  assistance Equipment used: Rolling walker (2 wheeled) Transfers: Sit to/from [employer] Sit to Stand: Mod assist;+2 physical assistance   Squat pivot transfers: Min assist     General transfer comment: Able to power up to standing with RW with mod assist +2. Min assist for squat-pivot for simulated toilet and w/c transfers. Extensive education given to mother concerning methods to assist.     Balance Overall balance assessment: Needs assistance Sitting-balance support: No upper extremity supported;Feet supported Sitting balance-Leahy Scale: Fair Sitting balance - Comments: can maintain sitting balance with UE assist   Standing balance support: Bilateral upper extremity supported Standing balance-Leahy Scale: Zero Standing balance comment: significant assist from therapists and RW to maintain stance                           ADL either performed or assessed with clinical judgement   ADL Overall ADL's : Needs assistance/impaired Eating/Feeding: Set up;Sitting Eating/Feeding Details (indicate cue type and reason): Difficulty eating banana due to pt              Upper Body Dressing : Minimal assistance;Sitting   Lower Body Dressing: Moderate assistance;Sitting/lateral leans Lower Body Dressing Details (indicate cue type and reason): Donning pants with lateral leans.  Toilet Transfer: Squat-pivot;BSC;Minimal assistance             General ADL Comments: Educated pt and his mother concerning safe mobility, techniques to assist pt in dressing and toileting tasks at home.      Vision       Perception     Praxis      Cognition Arousal/Alertness: Awake/alert Behavior During Therapy:  Flat affect Overall Cognitive Status: No family/caregiver present to determine baseline cognitive functioning Area of Impairment: Problem solving;Awareness                 Orientation Level: Disoriented to;Situation;Time Current Attention Level:  Selective   Following Commands: Follows one step commands with increased time Safety/Judgement: Decreased awareness of safety Awareness: Emergent Problem Solving: Slow processing General Comments: Increased time for processing with decreased awareness. Demonstrating improving motivation.         Exercises     Shoulder Instructions       General Comments      Pertinent Vitals/ Pain       Pain Assessment: Faces Faces Pain Scale: Hurts even more Pain Location: L knee and back Pain Descriptors / Indicators: Grimacing;Guarding Pain Intervention(s): Monitored during session;Repositioned  Home Living                                          Prior Functioning/Environment              Frequency  Min 2X/week        Progress Toward Goals  OT Goals(current goals can now be found in the care plan section)  Progress towards OT goals: Progressing toward goals  Acute Rehab OT Goals Patient Stated Goal: get stronger OT Goal Formulation: With patient Time For Goal Achievement: 08/26/17 Potential to Achieve Goals: Good  Plan Discharge plan needs to be updated    Co-evaluation    PT/OT/SLP Co-Evaluation/Treatment: Yes Reason for Co-Treatment: For patient/therapist safety;To address functional/ADL transfers          AM-PAC PT "6 Clicks" Daily Activity     Outcome Measure   Help from another person eating meals?: A Little Help from another person taking care of personal grooming?: A Little Help from another person toileting, which includes using toliet, bedpan, or urinal?: A Lot Help from another person bathing (including washing, rinsing, drying)?: A Lot Help from another person to put on and taking off regular upper body clothing?: A Lot Help from another person to put on and taking off regular lower body clothing?: A Lot 6 Click Score: 14    End of Session Equipment Utilized During Treatment: Gait belt;Rolling walker  OT Visit Diagnosis:  Other abnormalities of gait and mobility (R26.89);Other symptoms and signs involving cognitive function;Pain Pain - Right/Left: Left Pain - part of body: Knee(back)   Activity Tolerance Patient tolerated treatment well   Patient Left with call bell/phone within reach;in chair;with family/visitor present;Other (comment)   Nurse Communication Mobility status        Time: [id number] OT Time Calculation (min): 44 min  Charges: OT General Charges $OT Visit: 1 Visit OT Treatments $Self Care/Home Management : 23-37 mins  Doristine Sectionharity A Marimar Suber, MS OTR/L  Pager: [phone]    Armonie Staten A Kmari Brian 08/27/2017, 6:20 PM

## 2017-09-08 ENCOUNTER — Ambulatory Visit (HOSPITAL_COMMUNITY): Payer: Self-pay | Admitting: Dentistry

## 2017-09-08 ENCOUNTER — Encounter (HOSPITAL_COMMUNITY): Payer: Self-pay | Admitting: Dentistry

## 2017-09-08 VITALS — BP 98/60 | HR 113 | Temp 98.6°F

## 2017-09-08 DIAGNOSIS — K0820 Unspecified atrophy of edentulous alveolar ridge: Secondary | ICD-10-CM

## 2017-09-08 DIAGNOSIS — I058 Other rheumatic mitral valve diseases: Secondary | ICD-10-CM

## 2017-09-08 DIAGNOSIS — I059 Rheumatic mitral valve disease, unspecified: Secondary | ICD-10-CM

## 2017-09-08 DIAGNOSIS — Z01818 Encounter for other preprocedural examination: Secondary | ICD-10-CM

## 2017-09-08 DIAGNOSIS — K08109 Complete loss of teeth, unspecified cause, unspecified class: Secondary | ICD-10-CM

## 2017-09-08 DIAGNOSIS — K08199 Complete loss of teeth due to other specified cause, unspecified class: Secondary | ICD-10-CM

## 2017-09-08 NOTE — Progress Notes (Signed)
POST OPERATIVE NOTE:  09/08/2017 Marcus [name] [id number]  VITALS: BP 98/60 (BP Location: Right Arm)   Pulse (!) 113   Temp 98.6 F (37 C)    LABS:  Lab Results  Component Value Date   WBC 12.2 (H) 08/27/2017   HGB 8.5 (L) 08/27/2017   HCT 26.3 (L) 08/27/2017   MCV 88.3 08/27/2017   PLT 375 08/27/2017   BMET    Component Value Date/Time   NA 131 (L) 08/27/2017 0511   K 4.5 08/27/2017 0511   CL 99 (L) 08/27/2017 0511   CO2 25 08/27/2017 0511   GLUCOSE 95 08/27/2017 0511   BUN 15 08/27/2017 0511   CREATININE 0.83 08/27/2017 0511   CALCIUM 8.3 (L) 08/27/2017 0511   GFRNONAA >60 08/27/2017 0511   GFRAA >60 08/27/2017 0511    Lab Results  Component Value Date   INR 1.10 08/19/2017   INR 1.20 08/10/2017   INR 1.15 08/09/2017   No results found for: PTT   Marcus [name] is status post extraction of remaining teeth with alveoloplasty and pre-prosthetic surgery as needed in the operating room with general anesthesia on 08/21/2017.  The patient now presents for evaluation of healing and suture removal as needed.  SUBJECTIVE: Patient denies having any significant discomfort.  Patient denies having any sutures that remain.  Patient denies having any active bleeding.  EXAM: There is no sign of infection, heme, or ooze.  Patient is healing in by generalized primary closure.  No sutures remain.  Patient is now completely edentulous.  There is atrophy of the edentulous alveolar ridges.  PROCEDURE: The patient was given a chlorhexidine gluconate rinse for 30 seconds. No sutures needed to be removed.  ASSESSMENT: Post operative course is consistent with dental procedures performed in the operating room with general anesthesia. Loss of teeth due to extraction Completely edentulous Atrophy of edentulous alveolar ridges.  PLAN: 1.  Continue salt water rinses as needed. 2.  Brush tongue daily. 3.  Advance diet as tolerated. 4.  Follow-up with a dentist of his choice for  fabrication of upper and lower complete dentures ideally in mid May 2019 and once medically cleared after the heart valve surgery. 5. Inform Dental Medicine if Medicaid is obtained.   Charlynne Panderonald F. Laportia Carley, DDS

## 2017-09-08 NOTE — Patient Instructions (Signed)
PLAN: 1.  Continue salt water rinses as needed. 2.  Brush tongue daily. 3.  Advance diet as tolerated. 4.  Follow-up with a dentist of his choice for fabrication of upper and lower complete dentures ideally in mid May 2019 and once medically cleared after the heart valve surgery. 5. Inform Dental Medicine if Medicaid is obtained.   Charlynne Panderonald F. Telecia Larocque, DDS

## 2017-09-09 ENCOUNTER — Other Ambulatory Visit: Payer: Self-pay

## 2017-09-09 ENCOUNTER — Ambulatory Visit (HOSPITAL_COMMUNITY)
Admission: RE | Admit: 2017-09-09 | Discharge: 2017-09-09 | Disposition: A | Discharge status: Home / Self Care | Payer: Medicaid Other | Source: Ambulatory Visit | Attending: Physician Assistant | Admitting: Physician Assistant

## 2017-09-09 ENCOUNTER — Encounter (INDEPENDENT_AMBULATORY_CARE_PROVIDER_SITE_OTHER): Payer: Self-pay | Admitting: Physician Assistant

## 2017-09-09 ENCOUNTER — Ambulatory Visit (INDEPENDENT_AMBULATORY_CARE_PROVIDER_SITE_OTHER): Payer: Self-pay | Admitting: Physician Assistant

## 2017-09-09 VITALS — BP 103/73 | HR 104 | Temp 98.0°F

## 2017-09-09 DIAGNOSIS — I340 Nonrheumatic mitral (valve) insufficiency: Secondary | ICD-10-CM

## 2017-09-09 DIAGNOSIS — R0602 Shortness of breath: Secondary | ICD-10-CM

## 2017-09-09 DIAGNOSIS — I700 Atherosclerosis of aorta: Secondary | ICD-10-CM | POA: Insufficient documentation

## 2017-09-09 DIAGNOSIS — J90 Pleural effusion, not elsewhere classified: Secondary | ICD-10-CM | POA: Diagnosis not present

## 2017-09-09 DIAGNOSIS — I509 Heart failure, unspecified: Secondary | ICD-10-CM | POA: Insufficient documentation

## 2017-09-09 DIAGNOSIS — I330 Acute and subacute infective endocarditis: Secondary | ICD-10-CM

## 2017-09-09 DIAGNOSIS — G8929 Other chronic pain: Secondary | ICD-10-CM

## 2017-09-09 DIAGNOSIS — R600 Localized edema: Secondary | ICD-10-CM

## 2017-09-09 DIAGNOSIS — Z09 Encounter for follow-up examination after completed treatment for conditions other than malignant neoplasm: Secondary | ICD-10-CM

## 2017-09-09 DIAGNOSIS — M5441 Lumbago with sciatica, right side: Secondary | ICD-10-CM

## 2017-09-09 MED ORDER — ASPIRIN 81 MG PO TBEC: 81.0000 mg | DELAYED_RELEASE_TABLET | Freq: Every day | ORAL | 0 refills | Status: DC

## 2017-09-09 MED ORDER — SPIROMETER KIT: 1.0000 | PACK | 0 refills | Status: DC

## 2017-09-09 MED ORDER — HYDROCHLOROTHIAZIDE 25 MG PO TABS: 25.0000 mg | ORAL_TABLET | Freq: Every day | ORAL | 1 refills | Status: DC

## 2017-09-09 MED ORDER — GABAPENTIN 300 MG PO CAPS: 600.0000 mg | ORAL_CAPSULE | Freq: Three times a day (TID) | ORAL | 3 refills | Status: DC

## 2017-09-09 NOTE — Patient Instructions (Signed)
Atelectasis, Adult Atelectasis is a collapse of air sacs in the lungs (alveoli). The condition causes all or part of a lung to collapse. Atelectasis is a common problem after surgery. Its severity depends on the size of lung tissue area involved and the underlying cause. When severe, it can lead to shortness of breath and heart problems. Atelectasis can develop suddenly or over a long period of time. Atelectasis that develops over a long period of time (chronic atelectasis) often leads to infection, scarring, and other problems. What are the causes? This condition may be caused by:  Shallow breathing.  Medicines that make breathing more shallow.  A blockage in an airway. Blockages can result from: ? A buildup of mucus. ? A tumor. ? An inhaled object (foreign body). ? Enlarged lymph nodes. ? Fluid in the lungs (pleural effusion). ? A blood clot in the lungs.  Outside pressure on the lung. Pressure can be due to: ? A tumor. ? Fluid in the lungs (pleural effusion). ? Air leaking between the lung and rib cage (pneumothorax). ? Enlarged lymph nodes.  Improper expansion of the lungs. This may occur in newborns because of: ? Prematurity. ? Low oxygen levels. ? Secretions at birth that block the airway. ? Amniotic fluid that goes into the lungs (aspiration).  What increases the risk? This condition is more likely to develop in people who:  Have an injury or health problem that makes taking deep breaths difficult or painful.  Have certain infections or diseases, such as pneumonia or cystic fibrosis.  Have had surgery on the chest or abdomen.  Have broken ribs.  Have a tight bandage around their chest.  Have a collapsed lung due to pneumothorax.  Take medicines that decrease the rate of their breathing or how deeply they breathe, like sedatives.  Lie flat for long periods of time.  What are the signs or symptoms? Often, there are no symptoms for this condition. When symptoms  do appear, they may include:  Shortness of breath.  Bluish color to the nails, lips, or mouth (cyanosis).  A cough.  How is this diagnosed? This condition may be diagnosed based on:  Symptoms.  A physical exam.  A chest X-ray.  Sometimes specialized imaging tests are needed to diagnose the condition. How is this treated? Treatment for this condition depends on what caused the condition. Treatment may involve:  Coughing. Coughing helps loosen mucus in the airway.  Chest physiotherapy. This is a treatment to help loosen and clear mucus from the airways. It is done by clapping the chest.  Postural drainage techniques. This treatment involves positioning your body so your head is lower than your chest. It helps mucus drain from your airways.  An incentive spirometer. This is a device that is used to help with taking deeper breaths.  Positive pressure breathing. This is a form of breathing assistance in which air is forced into the lungs when you breathe in (inhale). You may have this treatment if your condition is severe.  Treatment of the underlying condition.  Follow these instructions at home:  Take over-the-counter and prescription medicines only as told by your health care provider.  Practice taking relaxed and deep breaths when you are sitting. A good time to practice is when you are watching TV. Take a few deep breaths during each commercial break.  Make sure to lie on your unaffected side when you are lying down. For example, if you have atelectasis in your left lung, lie on your right  side. This will help mucus drain from your airway.  Cough several times a day as told by your health care provider.  Perform chest physiotherapy or postural drainage techniques as told by your health care provider. If necessary, have someone help you.  If you were given a device to help with breathing, use it as told by your health care provider.  Stay as active as possible. Get help  right away if:  Your breathing problems get worse.  You have severe chest pain.  You develop severe coughing.  You cough up blood.  You have a fever.  You have persistent symptoms for more than 2-3 days.  Your symptoms suddenly get worse. This information is not intended to replace advice given to you by your health care provider. Make sure you discuss any questions you have with your health care provider. Document Released: 05/20/2005 Document Revised: 12/08/2015 Document Reviewed: 10/23/2015 Elsevier Interactive Patient Education  2018 [employer]  

## 2017-09-09 NOTE — Progress Notes (Signed)
Subjective:  Patient ID: Marcus [name], male    DOB: 05-06-1966  Age: 52 y.o. MRN: [id number]  CC: f/u hospital   HPI Marcus [name] is a 52 y.o. 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 Admitted 08/09/17 and discharged 08/27/17. Complained of SOB and diagnosed with new CHF with underlying PNA. CXR revealed pleural effusion, infiltrates, and atelectasis. Pleural effusion drained and placed on IV abx. Accompanied by mother and states he has mild SOB. Mother is worried because she noted her OTC oximeter showed 70% O2 on RA one time but mostly around 85% O2 RA. O2 saturation in clinic currently at 92% today. Did not perform any incentive spirometry in or out of hospital. Has bilateral LE edema since leaving the hospital. No abdominal pain or ascites.     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 Currently managed by ID as has a f/u in six days. Has finished a course of Ancef. Endorses chronic back pain which seems to be at baseline.       Outpatient Medications Prior to Visit  Medication Sig Dispense Refill  . aspirin 81 MG EC tablet Take 1 tablet (81 mg total) by mouth daily. 15 tablet 0  . fidaxomicin (DIFICID) 200 MG TABS tablet Take 1 tablet (200 mg total) by mouth 2 (two) times daily for 17 days. 35 tablet 0  . gabapentin (NEURONTIN) 100 MG capsule Take 1 capsule (100 mg total) by mouth at bedtime. 15 capsule 0  . thiamine 100 MG tablet Take 1 tablet (100 mg total) by mouth daily. 30 tablet 0   No facility-administered medications prior to visit.      ROS Review of Systems  Constitutional: Negative for chills, fever and malaise/fatigue.  Eyes: Negative for blurred vision.   Respiratory: Positive for shortness of breath.   Cardiovascular: Positive for leg swelling. Negative for chest pain and palpitations.  Gastrointestinal: Negative for abdominal pain and nausea.  Genitourinary: Negative for dysuria and hematuria.  Musculoskeletal: Positive for back pain. Negative for joint pain and myalgias.  Skin: Negative for rash.  Neurological: Negative for tingling and headaches.  Psychiatric/Behavioral: Negative for depression. The patient is not nervous/anxious.     Objective:  BP 103/73 (BP Location: Right Arm, Patient Position: Sitting, Cuff Size: Small)   Pulse (!) 104   Temp 98 F (36.7 C) (Oral)   SpO2 92%   BP/Weight 09/09/2017 09/08/2017 8/76/8115  Systolic BP 726 98 98  Diastolic BP 73 60 70  Wt. (Lbs) - - 131.84  BMI - - 18.39      Physical Exam  Constitutional: He is oriented to person, place, and time.  thin, NAD, polite, sitting in wheelchair.  HENT:  Head: Normocephalic and atraumatic.  Eyes: No scleral icterus.  Neck: Normal range of motion. Neck supple. No thyromegaly present.  Cardiovascular: Regular rhythm.  3/6 Systolic murmur. 2+ pitting edema of the LE bilaterally up to the mid shin.  Pulmonary/Chest:  Right lung sounds distant. Breathing mildly labored.   Abdominal: Soft. Bowel sounds are normal. There is no tenderness.  Neurological: He is alert and oriented to person, place, and time. No cranial nerve deficit.  Skin: Skin is warm and dry. No rash noted. No erythema. No pallor.  Psychiatric: He has a normal mood and affect. His behavior is normal. Thought content normal.  Vitals reviewed.    Assessment & Plan:    1. Hospital discharge follow-up - CBC with Differential - Basic Metabolic Panel - Pt advised to fill application for [employer] and [employer].  2. SOB (shortness of breath) - I have strongly advised to go to [facility] for CXR today. Mother assured me they will go today. I will call with results and advise  to have him managed as inpatient or outpatient.  - DG Chest 2 View; Future - Respiratory Therapy Supplies (SPIROMETER) KIT; 1 each by Does not apply route every 2 (two) hours.  Dispense: 1 kit; Refill: 0  3. Chronic right-sided low back pain with right-sided sciatica - gabapentin (NEURONTIN) 300 MG capsule; Take 2 capsules (600 mg total) by mouth 3 (three) times daily.  Dispense: 180 capsule; Refill: 3  4. Lower extremity edema - hydrochlorothiazide (HYDRODIURIL) 25 MG tablet; Take 1 tablet (25 mg total) by mouth daily. Take on tablet in the morning.  Dispense: 90 tablet; Refill: 1  5. Mitral valve insufficiency, etiology unspecified - aspirin 81 MG EC tablet; Take 1 tablet (81 mg total) by mouth daily.  Dispense: 15 tablet; Refill: 0 - Keep appt on 09/29/17 with Cardiology for evaluation of heart valve replacement  6. Vegetation of heart valve - aspirin 81 MG EC tablet; Take 1 tablet (81 mg total) by mouth daily.  Dispense: 15 tablet; Refill: 0 - Keep appt on 09/29/17 for evaluation of heart valve replacement    Meds ordered this encounter  Medications  . gabapentin (NEURONTIN) 300 MG capsule    Sig: Take 2 capsules (600 mg total) by mouth 3 (three) times daily.    Dispense:  180 capsule    Refill:  3    Order Specific Question:   Supervising Provider    Answer:   Tresa Garter [id number]  . Respiratory Therapy Supplies (SPIROMETER) KIT    Sig: 1 each by Does not apply route every 2 (two) hours.    Dispense:  1 kit    Refill:  0    Order Specific Question:   Supervising Provider    Answer:   Tresa Garter [id number]  . hydrochlorothiazide (HYDRODIURIL) 25 MG tablet    Sig: Take 1 tablet (25 mg total) by mouth daily. Take on tablet in the morning.    Dispense:  90 tablet    Refill:  1    Order Specific Question:   Supervising Provider    Answer:   Tresa Garter [id number]  . aspirin 81 MG EC tablet    Sig: Take 1 tablet (81 mg total) by mouth daily.     Dispense:  15 tablet    Refill:  0    Order Specific Question:   Supervising Provider    Answer:   Tresa Garter [[id number]]    Follow-up: 4 weeks   Clent Demark PA

## 2017-09-10 LAB — CBC WITH DIFFERENTIAL/PLATELET
BASOS: 1 %
Basophils Absolute: 0.1 10*3/uL (ref 0.0–0.2)
EOS (ABSOLUTE): 0.2 10*3/uL (ref 0.0–0.4)
EOS: 2 %
HEMATOCRIT: 27.9 % — AB (ref 37.5–51.0)
Hemoglobin: 8.8 g/dL — ABNORMAL LOW (ref 13.0–17.7)
IMMATURE GRANS (ABS): 0 10*3/uL (ref 0.0–0.1)
IMMATURE GRANULOCYTES: 1 %
Lymphocytes Absolute: 1.6 10*3/uL (ref 0.7–3.1)
Lymphs: 18 %
MCH: 28 pg (ref 26.6–33.0)
MCHC: 31.5 g/dL (ref 31.5–35.7)
MCV: 89 fL (ref 79–97)
MONOS ABS: 0.7 10*3/uL (ref 0.1–0.9)
Monocytes: 8 %
NEUTROS PCT: 70 %
Neutrophils Absolute: 6.2 10*3/uL (ref 1.4–7.0)
Platelets: 355 10*3/uL (ref 150–379)
RBC: 3.14 x10E6/uL — ABNORMAL LOW (ref 4.14–5.80)
RDW: 16.5 % — AB (ref 12.3–15.4)
WBC: 8.7 10*3/uL (ref 3.4–10.8)

## 2017-09-10 LAB — BASIC METABOLIC PANEL
BUN/Creatinine Ratio: 21 — ABNORMAL HIGH (ref 9–20)
BUN: 22 mg/dL (ref 6–24)
CALCIUM: 9.2 mg/dL (ref 8.7–10.2)
CO2: 22 mmol/L (ref 20–29)
Chloride: 105 mmol/L (ref 96–106)
Creatinine, Ser: 1.07 mg/dL (ref 0.76–1.27)
GFR, EST AFRICAN AMERICAN: 92 mL/min/{1.73_m2} (ref >59)
GFR, EST NON AFRICAN AMERICAN: 79 mL/min/{1.73_m2} (ref >59)
Glucose: 94 mg/dL (ref 65–99)
Potassium: 5.1 mmol/L (ref 3.5–5.2)
Sodium: 141 mmol/L (ref 134–144)

## 2017-09-15 ENCOUNTER — Ambulatory Visit (INDEPENDENT_AMBULATORY_CARE_PROVIDER_SITE_OTHER): Payer: Self-pay | Admitting: Internal Medicine

## 2017-09-15 ENCOUNTER — Encounter: Payer: Self-pay | Admitting: Internal Medicine

## 2017-09-15 DIAGNOSIS — A0472 Enterocolitis due to Clostridium difficile, not specified as recurrent: Secondary | ICD-10-CM

## 2017-09-15 DIAGNOSIS — R7881 Bacteremia: Secondary | ICD-10-CM

## 2017-09-15 DIAGNOSIS — B9561 Methicillin susceptible Staphylococcus aureus infection as the cause of diseases classified elsewhere: Secondary | ICD-10-CM

## 2017-09-15 NOTE — Assessment & Plan Note (Signed)
His [name] colitis has responded well to therapy.

## 2017-09-15 NOTE — Assessment & Plan Note (Signed)
I am quite hopeful that his MSSA bacteremia and all of its complications has now been completely cured.  He will stay off of antibiotics and follow-up here in 1 month.

## 2017-09-15 NOTE — Progress Notes (Signed)
[facility]  Patient Active Problem List   Diagnosis Date Noted  . [name] colitis     Priority: High  . Thoracic discitis 08/18/2017    Priority: High  . HAP (hospital-acquired pneumonia)     Priority: High  . Severe mitral regurgitation     Priority: High  . Acute heart failure (HCC)     Priority: High  . Bacteremia due to methicillin susceptible Staphylococcus aureus (MSSA) 06/09/2017    Priority: High  . Endocarditis of mitral valve 06/09/2017    Priority: High  . Septic arthritis of knee, left ([place]) 06/09/2017    Priority: High  . Meningitis due to bacteria     Priority: High  . Septic embolism ([place])     Priority: High  . Hepatitis C antibody positive in blood 06/10/2017    Priority: Medium  . Osteomyelitis ([place])   . Loose stools   . Protein-calorie malnutrition, severe 08/15/2017  . Chronic bilateral pleural effusions   . Pressure injury of skin 06/19/2017  . Elevated liver enzymes 06/09/2017  . Normocytic anemia 06/09/2017  . Thrombocytopenia ([place]) 06/09/2017  . Polysubstance abuse ([place]) 06/09/2017  . Sepsis ([place])   . AKI (acute kidney injury) ([place])     Patient's Medications  New Prescriptions   No medications on file  Previous Medications   ASPIRIN 81 MG EC TABLET    Take 1 tablet (81 mg total) by mouth daily.   [facility] MELATONIN 5 MG TABS    Take 1 tablet by mouth at bedtime.   GABAPENTIN (NEURONTIN) 300 MG CAPSULE    Take 2 capsules (600 mg total) by mouth 3 (three) times daily.   HYDROCHLOROTHIAZIDE (HYDRODIURIL) 25 MG TABLET    Take 1 tablet (25 mg total) by mouth daily. Take on tablet in the morning.   RESPIRATORY THERAPY SUPPLIES (SPIROMETER) KIT    1 each by Does not apply route every 2 (two) hours.   THIAMINE 100 MG TABLET    Take 1 tablet (100 mg total) by mouth daily.  Modified Medications   No medications on file  Discontinued Medications   No medications on file    Subjective: Marcus [name] is in for his  hospital follow-up visit.  He was hospitalized in January with MSSA bacteremia complicated by mitral valve endocarditis, meningitis septic left knee and thoracic spine infection at the T6-7 level.  He completed 6 weeks of IV cefazolin.  He was readmitted with worsening heart failure.  All admission blood cultures were negative but I elected to treat him again with cefazolin.  He received 17 days of IV cefazolin before converting to oral cephalexin.  He completed 4 weeks of a second course of therapy on 09/05/2017.  He developed [name] colitis while in the hospital and was treated with fidaxomicin.  He completed that therapy on 09/12/2017.  He is feeling much better.  He has not had any fever.  His diarrhea has resolved.  His back pain is better.  His appetite is improved and he is gaining weight.  He is working very hard at home on his physical therapy.  Review of Systems: Review of Systems  Constitutional: Negative for chills, diaphoresis, fever and weight loss.  Respiratory: Positive for shortness of breath. Negative for cough.   Cardiovascular: Negative for chest pain.  Gastrointestinal: Negative for abdominal pain, diarrhea, nausea and vomiting.  Skin: Negative for rash.  Neurological: Positive for headaches.  Past Medical History:  Diagnosis Date  . Acute kidney failure ([place]) 06/2017   hx/notes 06/13/2017  . Arthritis    "all my joints ache; at atll times" (06/13/2017)  . Bacteremia due to methicillin resistant Staphylococcus aureus    Archie Endo 06/13/2017  . Chronic lower back pain   . Endocarditis of mitral valve    MSSA mitral valve endocarditis/notes 06/13/2017  . Hepatitis C    hx/notes 06/13/2017  . 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 Malnutrition of moderate degree 06/19/2017  . Meningitis due to bacteria    Archie Endo 06/12/2017  . Pleural effusion, bilateral   . Polysubstance abuse ([place])    hx/notes 06/13/2017  . Septic arthritis of knee, left ([place]) 06/2017    Archie Endo 06/13/2017  . Septic embolism ([place]) 06/2017   Archie Endo 06/12/2017  . Severe mitral regurgitation   . Thrombocytopenia ([place])    hx/notes 06/13/2017    Social History   Tobacco Use  . Smoking status: Former Smoker    Packs/day: 1.00    Years: 10.00    Pack years: 10.00    Types: Cigarettes    Last attempt to quit: 11/04/2015    Years since quitting: 1.8  . Smokeless tobacco: Never Used  Substance Use Topics  . Alcohol use: Yes    Comment: occasionally   . Drug use: No    No family history on file.  No Known Allergies  Objective: Vitals:   09/15/17 1533  BP: 97/68  Pulse: (!) 109  Temp: 99.2 F (37.3 C)  TempSrc: Oral  SpO2: 94%  Height: '5\' 11"'$  (1.803 m)   Body mass index is 18.39 kg/m.  Physical Exam  Constitutional:  He is in good spirits.  He is accompanied by his mother.  He is seated in a wheelchair.  HENT:  Mouth/Throat: No oropharyngeal exudate.  Cardiovascular: Regular rhythm.  Murmur heard. He is tachycardic with a 3/6 holosystolic murmur that is unchanged.  Pulmonary/Chest: Effort normal and breath sounds normal. He has no wheezes. He has no rales.  Abdominal: Soft. He exhibits no distension. There is no tenderness.    Lab Results    Problem List Items Addressed This Visit    None       Michel Bickers, MD [facility] [phone] pager   [phone] cell 09/15/2017, 4:16 PM

## 2017-09-17 ENCOUNTER — Encounter: Payer: Self-pay | Admitting: *Deleted

## 2017-09-18 ENCOUNTER — Other Ambulatory Visit: Payer: Self-pay

## 2017-09-18 ENCOUNTER — Emergency Department (HOSPITAL_COMMUNITY)
Admission: EM | Admit: 2017-09-18 | Discharge: 2017-09-18 | Disposition: A | Discharge status: Home / Self Care | Payer: Medicaid Other | Attending: Emergency Medicine | Admitting: Emergency Medicine

## 2017-09-18 ENCOUNTER — Encounter (HOSPITAL_COMMUNITY): Payer: Self-pay

## 2017-09-18 ENCOUNTER — Telehealth (INDEPENDENT_AMBULATORY_CARE_PROVIDER_SITE_OTHER): Payer: Self-pay | Admitting: Physician Assistant

## 2017-09-18 ENCOUNTER — Emergency Department (HOSPITAL_COMMUNITY): Payer: Medicaid Other

## 2017-09-18 DIAGNOSIS — Z5321 Procedure and treatment not carried out due to patient leaving prior to being seen by health care provider: Secondary | ICD-10-CM | POA: Diagnosis not present

## 2017-09-18 DIAGNOSIS — R918 Other nonspecific abnormal finding of lung field: Secondary | ICD-10-CM | POA: Diagnosis present

## 2017-09-18 LAB — COMPREHENSIVE METABOLIC PANEL
ALBUMIN: 2.9 g/dL — AB (ref 3.5–5.0)
ALT: 21 U/L (ref 17–63)
AST: 24 U/L (ref 15–41)
Alkaline Phosphatase: 86 U/L (ref 38–126)
Anion gap: 11 (ref 5–15)
BUN: 33 mg/dL — AB (ref 6–20)
CHLORIDE: 101 mmol/L (ref 101–111)
CO2: 22 mmol/L (ref 22–32)
CREATININE: 1.23 mg/dL (ref 0.61–1.24)
Calcium: 8.7 mg/dL — ABNORMAL LOW (ref 8.9–10.3)
GFR calc Af Amer: >60 mL/min (ref >60)
GFR calc non Af Amer: >60 mL/min (ref >60)
GLUCOSE: 99 mg/dL (ref 65–99)
POTASSIUM: 4.3 mmol/L (ref 3.5–5.1)
Sodium: 134 mmol/L — ABNORMAL LOW (ref 135–145)
Total Bilirubin: 0.5 mg/dL (ref 0.3–1.2)
Total Protein: 7.6 g/dL (ref 6.5–8.1)

## 2017-09-18 LAB — CBC WITH DIFFERENTIAL/PLATELET
BASOS ABS: 0.1 10*3/uL (ref 0.0–0.1)
BASOS PCT: 1 %
EOS PCT: 5 %
Eosinophils Absolute: 0.4 10*3/uL (ref 0.0–0.7)
HEMATOCRIT: 29.9 % — AB (ref 39.0–52.0)
Hemoglobin: 9.2 g/dL — ABNORMAL LOW (ref 13.0–17.0)
LYMPHS PCT: 28 %
Lymphs Abs: 2.3 10*3/uL (ref 0.7–4.0)
MCH: 27.1 pg (ref 26.0–34.0)
MCHC: 30.8 g/dL (ref 30.0–36.0)
MCV: 87.9 fL (ref 78.0–100.0)
Monocytes Absolute: 0.6 10*3/uL (ref 0.1–1.0)
Monocytes Relative: 7 %
NEUTROS ABS: 5 10*3/uL (ref 1.7–7.7)
Neutrophils Relative %: 59 %
PLATELETS: 384 10*3/uL (ref 150–400)
RBC: 3.4 MIL/uL — AB (ref 4.22–5.81)
RDW: 16.6 % — AB (ref 11.5–15.5)
WBC: 8.4 10*3/uL (ref 4.0–10.5)

## 2017-09-18 NOTE — ED Triage Notes (Signed)
Pt presents for follow up from chest xray on 4/9, ordered by Dr. Lily KocherGomez. Per pt, Dr. Lily KocherGomez "didn't like what he saw on the xray and wanted me to come here".   Pt denies any shortness of breath, cough or chest pain.

## 2017-09-18 NOTE — Telephone Encounter (Signed)
Called patient and spoke with his mother. And went over the results with her. Told her that the PCP recommended that patient go back to ED for further evaluation and further treatment. Mother wanted to know why they were not informed before now, I explained to her that someone attempted to contact them on 4/11 and 4/17 and mailed a letter. Mother didn't seem to want to take patient to ED as he has been to several appointments and nobody said they needed to go. Reiterated that PCP recommended they take patient to ED. Maryjean Mornempestt S Roberts, CMA

## 2017-09-18 NOTE — Telephone Encounter (Signed)
Pt called to get the lab result and x-ray report, she was the result on Mychart but she need explanation , please follow up

## 2017-09-24 ENCOUNTER — Ambulatory Visit: Payer: Self-pay | Admitting: Neurology

## 2017-09-24 ENCOUNTER — Telehealth: Payer: Self-pay

## 2017-09-24 ENCOUNTER — Encounter: Payer: Self-pay | Admitting: Neurology

## 2017-09-24 NOTE — Telephone Encounter (Signed)
Patient no show for appt today. 

## 2017-09-25 ENCOUNTER — Ambulatory Visit: Payer: Self-pay | Admitting: Adult Health

## 2017-09-25 ENCOUNTER — Telehealth: Payer: Self-pay | Admitting: Neurology

## 2017-09-25 ENCOUNTER — Encounter: Payer: Self-pay | Admitting: Adult Health

## 2017-09-25 ENCOUNTER — Telehealth: Payer: Self-pay | Admitting: Adult Health

## 2017-09-25 VITALS — BP 105/70 | HR 110 | Ht 71.0 in

## 2017-09-25 DIAGNOSIS — G8929 Other chronic pain: Secondary | ICD-10-CM

## 2017-09-25 DIAGNOSIS — M5441 Lumbago with sciatica, right side: Secondary | ICD-10-CM

## 2017-09-25 DIAGNOSIS — I76 Septic arterial embolism: Secondary | ICD-10-CM

## 2017-09-25 DIAGNOSIS — I63432 Cerebral infarction due to embolism of left posterior cerebral artery: Secondary | ICD-10-CM

## 2017-09-25 DIAGNOSIS — I63412 Cerebral infarction due to embolism of left middle cerebral artery: Secondary | ICD-10-CM

## 2017-09-25 MED ORDER — GABAPENTIN 300 MG PO CAPS: ORAL_CAPSULE | ORAL | 3 refills | Status: DC

## 2017-09-25 NOTE — Patient Instructions (Signed)
Continue aspirin 81 mg daily for secondary stroke prevention  We will check blood work today to look at cholesterol levels  Please start taking gabapentin 300mg  in the AM, 300mg  in the afternoon and 900mg  in the evening   We will call you to schedule CTA head and neck  Continue to follow up with your PCP regarding cholesterol and blood pressure  Continue to monitor blood pressure at home  contiune home exercises   Cleared by neurology and Dr. Pearlean BrownieSethi to have valve replacement and stop aspirin 3 days prior and start after once hemodynamically stable  Maintain strict control of hypertension with blood pressure goal below 130/90, diabetes with hemoglobin A1c goal below 6.5% and cholesterol with LDL cholesterol (bad cholesterol) goal below 70 mg/dL. I also advised the patient to eat a healthy diet with plenty of whole grains, cereals, fruits and vegetables, exercise regularly and maintain ideal body weight.  Followup in the future with me in 3 months
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
## 2017-09-25 NOTE — Telephone Encounter (Signed)
I think the patient has medicaid order sent to GI. They will obtain auth and will reach out to the pt to schedule.

## 2017-09-26 ENCOUNTER — Telehealth: Payer: Self-pay

## 2017-09-26 LAB — LIPID PANEL
CHOLESTEROL TOTAL: 154 mg/dL (ref 100–199)
Chol/HDL Ratio: 3 ratio (ref 0.0–5.0)
HDL: 51 mg/dL (ref >39)
LDL CALC: 89 mg/dL (ref 0–99)
Triglycerides: 71 mg/dL (ref 0–149)
VLDL CHOLESTEROL CAL: 14 mg/dL (ref 5–40)

## 2017-09-26 MED ORDER — ATORVASTATIN CALCIUM 20 MG PO TABS: 20.0000 mg | ORAL_TABLET | Freq: Every day | ORAL | 4 refills | Status: AC

## 2017-09-26 NOTE — Addendum Note (Signed)
Addended by: George HughVANSCHAICK, Han Lysne on: 09/26/2017 08:10 AM   Modules accepted: Orders

## 2017-09-26 NOTE — Telephone Encounter (Signed)
-----   Message from George HughJessica Vanschaick, NP sent at 09/26/2017  8:09 AM EDT ----- Can you please inform patient that his bad cholesterol numbers were mildly elevated at 89 and we want that number less than 70. I sent in a prescription for lipitor 20mg . Thank you!

## 2017-09-26 NOTE — Telephone Encounter (Signed)
Notes recorded by Hildred AlaminMurrell, Geraline Halberstadt Y, RN on 09/26/2017 at 9:46 AM EDT Rn call patients mom on dpr about his bad cholesterol were mildly elevated at 89. Shanda BumpsJessica NP wants the number less than 70. A rx was sent in for lipitor of 20mg  daily for pt to take daily. The mom verbalized understanding,and need to take medication daily. ------

## 2017-09-26 NOTE — Progress Notes (Signed)
I agree with the above plan 

## 2017-09-29 ENCOUNTER — Institutional Professional Consult (permissible substitution) (INDEPENDENT_AMBULATORY_CARE_PROVIDER_SITE_OTHER): Payer: Self-pay | Admitting: Thoracic Surgery (Cardiothoracic Vascular Surgery)

## 2017-09-29 ENCOUNTER — Ambulatory Visit (INDEPENDENT_AMBULATORY_CARE_PROVIDER_SITE_OTHER): Payer: Self-pay | Admitting: Cardiology

## 2017-09-29 ENCOUNTER — Ambulatory Visit (HOSPITAL_BASED_OUTPATIENT_CLINIC_OR_DEPARTMENT_OTHER): Payer: Medicaid Other

## 2017-09-29 ENCOUNTER — Other Ambulatory Visit: Payer: Self-pay

## 2017-09-29 ENCOUNTER — Encounter: Payer: Self-pay | Admitting: Thoracic Surgery (Cardiothoracic Vascular Surgery)

## 2017-09-29 ENCOUNTER — Ambulatory Visit
Admission: RE | Admit: 2017-09-29 | Discharge: 2017-09-29 | Disposition: A | Discharge status: Home / Self Care | Payer: No Typology Code available for payment source | Source: Ambulatory Visit | Attending: Cardiology | Admitting: Cardiology

## 2017-09-29 VITALS — BP 100/69 | HR 110 | Resp 16 | Ht 71.0 in

## 2017-09-29 VITALS — BP 116/68 | HR 110 | Ht 71.0 in | Wt 132.0 lb

## 2017-09-29 DIAGNOSIS — R7881 Bacteremia: Secondary | ICD-10-CM

## 2017-09-29 DIAGNOSIS — I313 Pericardial effusion (noninflammatory): Secondary | ICD-10-CM

## 2017-09-29 DIAGNOSIS — I058 Other rheumatic mitral valve diseases: Secondary | ICD-10-CM

## 2017-09-29 DIAGNOSIS — I340 Nonrheumatic mitral (valve) insufficiency: Secondary | ICD-10-CM | POA: Insufficient documentation

## 2017-09-29 DIAGNOSIS — I5032 Chronic diastolic (congestive) heart failure: Secondary | ICD-10-CM | POA: Insufficient documentation

## 2017-09-29 DIAGNOSIS — R0689 Other abnormalities of breathing: Secondary | ICD-10-CM

## 2017-09-29 DIAGNOSIS — I059 Rheumatic mitral valve disease, unspecified: Secondary | ICD-10-CM

## 2017-09-29 DIAGNOSIS — R0600 Dyspnea, unspecified: Secondary | ICD-10-CM | POA: Insufficient documentation

## 2017-09-29 DIAGNOSIS — F1910 Other psychoactive substance abuse, uncomplicated: Secondary | ICD-10-CM

## 2017-09-29 LAB — ECHOCARDIOGRAM COMPLETE
Height: 71 in
Weight: 2112 [oz_av]

## 2017-09-29 NOTE — Progress Notes (Signed)
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 pericardial effusion was  identified.  Impressions:  - Normal LV size with EF 55%. Moderate diastolic dysfunction.  Normal RV size and systolic function. There appeared to be a  vegetation on the anterior mitral leaflet (though not  well-visualized) with severe, posteriorly-directed mitral  regurgitation. Moderate pulmonary hypertension. Recommend TEE.  -------------------------------------------------------------------  Study data: Comparison was made to the study of 06/09/2017. Study  status: Routine. Procedure: The patient was unable to respond to  pain level query due to clinical condition. Transthoracic  echocardiography. Image quality was adequate. Study completion:  There were no complications. Transthoracic  echocardiography. M-mode, complete 2D, spectral Doppler, and color  Doppler. Birthdate: Patient  birthdate: 07/27/1965. Age: Patient  is 52 yr old. Sex: Gender: male. BMI: 20.9 kg/m^2. Blood  pressure: 101/75 Patient status: Inpatient. Study date:  Study date: 08/10/2017. Study time: 10:37 AM. Location: Bedside.  -------------------------------------------------------------------  -------------------------------------------------------------------  Left ventricle: The cavity size was normal. Wall thickness was  normal. The estimated ejection fraction was 55%. Wall motion was  normal; there were no regional wall motion abnormalities. Features  are consistent with a pseudonormal left ventricular filling  pattern, with concomitant abnormal relaxation and increased filling  pressure (grade 2 diastolic dysfunction).  -------------------------------------------------------------------  Aortic valve: Trileaflet. Doppler: There was no stenosis.  There was trivial regurgitation.  -------------------------------------------------------------------  Aorta: Aortic root: The aortic root was normal in size.  Ascending aorta: The ascending aorta was normal in size.  -------------------------------------------------------------------  Mitral valve: There appeared to be an anterior leaflet mitral  valve vegetation (not ideally visualized) with severe,  posteriorly-directed mitral regurgitation. Doppler: There was no  evidence for stenosis. Valve area by pressure half-time: 4.23  cm^2. Indexed valve area by pressure half-time: 2.3 cm^2/m^2.  Peak gradient (D): 10 mm Hg.  -------------------------------------------------------------------  Left atrium: The atrium was moderately dilated.  -------------------------------------------------------------------  Right ventricle: The cavity size was normal. Systolic function was  normal.  -------------------------------------------------------------------  Pulmonic valve: Structurally normal valve. Cusp separation was  normal. Doppler: Transvalvular  velocity was within the normal  range. There was trivial regurgitation.  -------------------------------------------------------------------  Tricuspid valve: Doppler: There was trivial regurgitation.  -------------------------------------------------------------------  Right atrium: The atrium was normal in size.  -------------------------------------------------------------------  Pericardium: Pleural effusion noted. A small pericardial effusion  was identified.  -------------------------------------------------------------------  Systemic veins: IVC measured 2.13 cm with < 50% respirophasic  variation, suggesting RA pressure 15 mmHg.  -------------------------------------------------------------------  Measurements  Left ventricle Value Reference  LV ID, ED, PLAX chordal 47.1 mm 43 - 52  LV ID, ES, PLAX chordal 36.2 mm 23 - 38  LV fx shortening, PLAX chordal (L) 23 % >=29  LV PW thickness, ED 11.1 mm ----------  IVS/LV PW ratio, ED 0.83 <=1.3  LV e&', lateral 11.5 cm/s ----------  LV E/e&', lateral 13.91 ----------  LV e&', medial 5.44 cm/s ----------  LV E/e&', medial 29.41 ----------  LV e&', average 8.47 cm/s ----------  LV E/e&', average 18.89 ----------  Ventricular septum Value Reference  IVS thickness, ED 9.19 mm ----------  LVOT Value Reference  LVOT ID, S 20 mm ----------  LVOT area 3.14 cm^2 ----------  Aortic valve Value Reference  Aortic regurg pressure half-time 541 ms ----------  Aorta Value Reference  Aortic root ID, ED 31 mm ----------  Ascending aorta ID, A-P, S 28 mm ----------  Left atrium Value Reference  LA ID, A-P, ES 50 mm ----------  LA ID/bsa, A-P (H) 2.72 cm/m^2 <=2.2  LA volume, S 75.9 ml ----------  LA volume/bsa, S 41.2 ml/m^2 ----------  LA volume, ES, 1-p A4C 85.6 ml ----------  LA volume/bsa, ES, 1-p A4C 46.5 ml/m^2 ----------  LA volume, ES, 1-p A2C 64.6 ml ----------  LA volume/bsa, ES, 1-p A2C 35.1 ml/m^2 ----------  Mitral valve  Value Reference  Mitral E-wave peak velocity 160 cm/s ----------  Mitral A-wave peak velocity 97.3 cm/s ----------  Mitral deceleration time 176 ms 150 - 230  Mitral pressure half-time 52 ms ----------  Mitral peak gradient, D 10 mm Hg ----------  Mitral E/A ratio, peak 1.6 ----------  Mitral valve area, PHT, DP 4.23 cm^2 ----------  Mitral valve area/bsa, PHT, DP 2.3 cm^2/m^2 ----------  Mitral maximal regurg velocity, 575 cm/s ----------  PISA  Mitral regurg VTI, PISA 123 cm ----------  Mitral ERO, PISA 0.11 cm^2 ----------  Mitral regurg volume, PISA 14 ml ----------  Pulmonary arteries Value Reference  PA pressure, S, DP (H) 62 mm Hg <=30  Tricuspid valve Value Reference  Tricuspid regurg peak velocity 341 cm/s ----------  Tricuspid peak RV-RA gradient 47 mm Hg ----------  Right atrium Value Reference  RA ID, S-I, ES, A4C 39.1 mm 34 - 49  RA area, ES, A4C 10.5 cm^2 8.3 - 19.5  RA volume, ES, A/L 23.9 ml ----------  RA volume/bsa, ES, A/L 13 ml/m^2 ----------  Systemic veins Value Reference  Estimated CVP 15 mm Hg ----------  Right ventricle Value Reference  RV ID, minor axis, ED, A4C base 34.3 mm ----------  RV ID, minor axis, ED, A4C mid 26.2 mm ----------  RV ID, major axis, ED, A4C 77.8 mm 55 - 91  TAPSE 18.2 mm ----------  RV pressure, S, DP (H) 62 mm Hg <=30  RV s&', lateral, S 10.9 cm/s ----------  Pulmonic valve Value Reference  Pulmonic regurg velocity, ED 162 cm/s ----------  Pulmonic regurg gradient, ED 10 mm Hg ----------  Legend:  (L) and (H) mark values outside specified reference range.  -------------------------------------------------------------------  Prepared and Electronically Authenticated by  Loralie Champagne, M.D.  2019-03-10T13:06:43  TRANSESOPHAGEAL ECHOCARDIOGRAM (TEE) NOTE  INDICATIONS:  infective endocarditis  PROCEDURE:  Informed consent was obtained prior to the procedure. The risks, benefits and alternatives for the procedure were  discussed and the patient comprehended these risks. Risks include, but are not limited to, cough, sore throat, vomiting, nausea, somnolence, esophageal and stomach trauma or perforation, bleeding, low blood pressure, aspiration, pneumonia, infection, trauma to the teeth and death.  After a procedural time-out, the patient was given propofol per anesthesia for sedation. The patient's heart rate, blood pressure, and oxygen saturation are monitored continuously during the procedure.The oropharynx was anesthetized with 2 cetacaine sprays. The transesophageal probe was inserted in the esophagus and  stomach without difficulty and multiple views were obtained. The patient was kept under observation until the patient left the procedure room. The patient left the procedure room in stable condition.  Agitated microbubble saline contrast was administered.  COMPLICATIONS:  There were no immediate complications.  Findings:  1. LEFT VENTRICLE: The left ventricular wall thickness is mildly increased. The left ventricular cavity is normal in size. Wall motion is normal. LVEF is 60-65%. 2. RIGHT VENTRICLE: The right ventricle is normal in structure and function without any thrombus or masses.  3. LEFT ATRIUM: The left atrium is mildly dilated in size without any thrombus or masses. There is not spontaneous echo contrast ("smoke") in the left atrium consistent with a low flow state. 4. LEFT ATRIAL APPENDAGE: The left atrial appendage is free of any thrombus or masses. The appendage has single lobes. Pulse doppler indicates high flow in the appendage. 5. ATRIAL SEPTUM: The atrial septum appears intact and is free of thrombus and/or masses. There is no evidence for interatrial shunting by color doppler and saline microbubble. 6. RIGHT ATRIUM: The right atrium is dilated without any thrombus or masses. 7. MITRAL VALVE: The mitral valve is thickened with a mobile mass noted on the anterior leaflet - flail cord is also noted.  There is a ~0.5 cm round perforation of the anterior leaflet with Severe regurgitation both through the perforation and the malcoapted valve leaflets. This was visualized well in 2D and 3D views.  8. AORTIC VALVE: The aortic valve is trileaflet, somewhat thickened, but no obvious vegetations. There is trivial regurgitation. There were no vegetations or stenosis 9. TRICUSPID VALVE: The tricuspid valve is normal in structure and function with Mild regurgitation. There were no vegetations or stenosis 10. PULMONIC VALVE: The pulmonic valve is normal in structure and function with trivial regurgitation. There were no vegetations or stenosis. 11. AORTIC ARCH, ASCENDING AND DESCENDING AORTA: There was no Ron Parker et. Al, 1992) atherosclerosis of the ascending aorta, aortic arch, or proximal descending aorta. 12. PULMONARY VEINS: Anomalous pulmonary venous return was not noted.  13. PERICARDIUM: The pericardium appeared normal and non-thickened. There is a Minimal pericardial effusion.  IMPRESSION:  1. Endocarditis of the mitral valve, with mass and partially flail anterior leaflet, flail cord and also a large 0.5 cm round perforation. There is severe regurgitation. 2. No LAA thrombus 3. Negative for PFO 4. Aortic valve thickening with trivial AI 5. Mild TR, trivial PI 6. LVEF 60-65% 7. Trivial pericardial effusion RECOMMENDATIONS:  1. Evaluation for mitral valve replacement by CT surgery is recommended. Nadean Corwin Hilty  08/14/2017, 10:26 AM    RIGHT/LEFT HEART CATH AND CORONARY ANGIOGRAPHY  Conclusion     Mid RCA lesion is 25% stenosed.  Prox LAD lesion is 25% stenosed.  Ost 1st Diag lesion is 70% stenosed. This is a small vessel.  LV end diastolic pressure is normal.  There is no aortic valve stenosis.  Hemodynamic findings consistent with mild pulmonary hypertension.  Ao sat 96%. PA sat 61%. CO 5.03 L/min. CI 2.91. Prominent V waves on the PCWP tracing.   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  Mild pulmonary hypertension with prominent V waves noted, consistent with severe mitral regurgitation.   Indications   Severe mitral regurgitation [I34.0 (OXB-35-HG)]  Acute systolic heart failure (HCC) [I50.21 (ICD-10-CM)]  Procedural Details/Technique   Technical Details The risks, benefits, and details of the procedure were explained to the patient. The patient verbalized understanding and wanted to proceed. Informed written consent was obtained.  PROCEDURE TECHNIQUE: After Xylocaine anesthesia, a 4/5 French sheath was placed in the left antecubital area in exchange for a peripheral IV. After Xylocaine anesthesia, a 87F sheath was placed in the right radial artery with a single anterior needle wall stick. A 7 French balloontipped Swan-Ganz catheter was advanced to the pulmonary artery under fluoroscopic guidance. Hemodynamic pressures were obtained. Oxygen saturations were obtained. Left heart cath was done using a JR4 catheter, when it crossed the aortic valve during manipulation to try to engage the RCA. Pressures were obtained with the catheter just across the aortic valve. Right coronary angiography was done using a Judkins R4 guide catheter. Catheter tended to select the conus branch. Left coronary angiography was done using a Judkins L3.5 guide catheter.     Contrast: 60 cc     Estimated blood loss <50 mL.  During this procedure the patient was administered the following to achieve and maintain moderate conscious sedation: Versed 2 mg, Fentanyl 50 mcg, while the patient's heart rate, blood pressure, and oxygen saturation were continuously monitored. The period of conscious sedation was 37 minutes, of which I was present face-to-face 100% of this time.  Complications   Complications documented before study signed (08/19/2017 8:43 AM EDT)    No complications were associated with this study.  Documented by Jettie Booze, MD - 08/19/2017 8:39 AM EDT    Coronary  Findings   Diagnostic  Dominance: Right  Left Anterior Descending  Prox LAD lesion 25% stenosed  Prox LAD lesion is 25% stenosed.  First Diagonal Branch  Vessel is small in size.  Ost 1st Diag lesion 70% stenosed  Ost 1st Diag lesion is 70% stenosed.  Right Coronary Artery  Mid RCA lesion 25% stenosed  Mid RCA lesion is 25% stenosed.  Intervention   No interventions have been documented.  Right Heart   Right Heart Pressures Hemodynamic findings consistent with mild pulmonary hypertension. Ao sat 96%. PA sat 61%. CO 5.03 L/min. CI 2.91. Prominent V waves on the PCWP tracing.  Left Heart   Left Ventricle LV end diastolic pressure is normal.  Aortic Valve There is no aortic valve stenosis.  Coronary Diagrams   Diagnostic Diagram       Implants    No implant documentation for this case.  MERGE Images   Show images for CARDIAC CATHETERIZATION   Link to Procedure Log   Procedure Log    Hemo Data    Most Recent Value  Fick Cardiac Output 5.03 L/min  Fick Cardiac Output Index 2.91 (L/min)/BSA  RA A Wave 11 mmHg  RA V Wave 9 mmHg  RA Mean 8 mmHg  RV Systolic Pressure 36 mmHg  RV Diastolic Pressure 5 mmHg  RV EDP 10 mmHg  PA Systolic Pressure 44 mmHg  PA Diastolic Pressure 23 mmHg  PA Mean 32 mmHg  PW A Wave 21 mmHg  PW V Wave 37 mmHg  PW Mean 24 mmHg  AO Systolic Pressure 86 mmHg  AO Diastolic Pressure 62 mmHg  AO Mean 74 mmHg  LV Systolic Pressure 87 mmHg  LV Diastolic Pressure 18 mmHg  LV EDP 12 mmHg  Arterial Occlusion Pressure Extended Systolic Pressure 88 mmHg  Arterial Occlusion Pressure Extended Diastolic Pressure 63 mmHg  Arterial Occlusion Pressure Extended Mean Pressure 74 mmHg  Left Ventricular Apex Extended Systolic Pressure 84 mmHg  Left Ventricular Apex Extended Diastolic Pressure 12 mmHg  Left Ventricular Apex Extended EDP Pressure 15 mmHg  QP/QS 1  TPVR Index 11 HRUI  TSVR Index 26.11 HRUI  PVR SVR Ratio 0.12  TPVR/TSVR Ratio 0.42      CT ANGIOGRAPHY CHEST, ABDOMEN AND PELVIS  TECHNIQUE: Multidetector CT imaging through the chest, abdomen and pelvis was performed using the standard protocol during bolus administration of intravenous contrast. Multiplanar reconstructed images and MIPs were obtained and reviewed to evaluate the vascular anatomy.  CONTRAST:  100 cc ISOVUE-370 IOPAMIDOL (ISOVUE-370) INJECTION 76%  COMPARISON:  Chest CT 08/09/2017  FINDINGS: CTA CHEST FINDINGS  Cardiovascular: No acute pulmonary embolus. Satisfactory opacification of the aorta and pulmonary arteries. Normal size heart without pericardial effusion. Normal branch pattern of the great vessels off the arch. No coronary arteriosclerosis. No aortic atherosclerosis. No aneurysm. No aortic dissection.  Mediastinum/Nodes: No supraclavicular, axillary, mediastinal nor hilar lymphadenopathy. Trachea is patent and normal caliber without intraluminal abnormality. Esophagus is unremarkable. No thyromegaly is noted.  Lungs/Pleura: Bilateral moderate-sized pleural effusions slightly smaller on the right with loculated component. Adjacent compressive atelectasis is noted. Patchy airspace opacities in the left upper lobe are redemonstrated, with slight improvement in aeration since prior though the vast majority of the pulmonary opacity still persists.  Musculoskeletal: Redemonstration of marked disc space narrowing C6-7 with endplate irregularity of the inferior endplate of T6 superior endplate of T7 with resultant mild focal kyphosis.  Review of the MIP images confirms the above findings.  CTA ABDOMEN AND PELVIS FINDINGS  VASCULAR  Aorta: Normal caliber aorta without aneurysm, dissection, vasculitis or significant stenosis.  Celiac: Patent without evidence of aneurysm, dissection, vasculitis or significant stenosis.  SMA: Patent without evidence of aneurysm, dissection, vasculitis or significant  stenosis.  Renals: Single renal arteries bilaterally without acute abnormality. No significant stenosis, dissection or evidence of fibromuscular dysplasia.  IMA: Patent  Inflow: No aneurysm or dissection. Minimal atherosclerotic plaque in the proximal right common iliac and both distal iliac arteries. No significant stenosis. No dissection or aneurysm.  Veins: No obvious venous abnormality within the limitations of this arterial phase study.  Review of the MIP images confirms the above findings.  NON-VASCULAR  Hepatobiliary: No focal liver abnormality is seen. No gallstones, gallbladder wall thickening, or biliary dilatation.  Pancreas: Unremarkable. No pancreatic ductal dilatation or surrounding inflammatory changes.  Spleen: Heterogeneous enhancement likely due to timing of contrast bolus. No splenomegaly.  Adrenals/Urinary Tract: No adrenal no renal masses. No nephrolithiasis nor obstructive uropathy. No hydroureteronephrosis. Unremarkable bladder.  Stomach/Bowel: Nondistended stomach with normal small bowel rotation. Mild fluid-filled distention of small bowel loops with slight mural thickening of jejunum may reflect a mild small bowel enteritis. No mechanical source obstruction is seen. Colon is. Normal appearing appendix.  Lymphatic: No abdominopelvic adenopathy.  Reproductive: Normal size prostate and seminal vesicles.  Other: Small amount of fluid in the left inguinal canal.  Musculoskeletal: Plate and screw fixation of the left acetabulum. 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 No acute osseous abnormality. There is lower lumbar degenerative facet arthropathy. Minimal anterolisthesis of L4 on L5. Marked disc space narrowing at L5-S1.  Review of the MIP images confirms the above findings.  IMPRESSION: Chest CT:  1. No acute pulmonary embolus. 2. No aortic aneurysm or dissection. 3. Bilateral moderate-sized pleural effusions  slightly smaller on the right with loculated component redemonstrated. Adjacent compressive atelectasis is seen both lower lobes. 4. Relatively unchanged patchy airspace opacity in the left upper lobe only minimally improved in aeration. 5. Redemonstration of focal kyphosis at T6-7 secondary to endplate irregularities and disc space narrowing suspicious for changes of discitis/osteomyelitis. MRI would be the  study of choice for further correlation as to acuity.  Abdomen and pelvic CT:  1. No aortic aneurysm or dissection. Patent branch vessels without significant stenosis or focal abnormality. 2. Mild fluid-filled distention of small bowel which transmural thickening suspicious for changes of mild enteritis. No mechanical bowel obstruction.   Electronically Signed   By: Ashley Royalty M.D.   On: 08/14/2017 21:21   CHEST - 2 VIEW  COMPARISON:  09/09/2017 CXR  FINDINGS: Stable cardiomegaly. No aortic aneurysm. Moderate right and small left pleural effusions are redemonstrated, unchanged on the right and slightly decreased on the left. No acute pulmonary consolidation. No overt pulmonary edema. Osseous elements are stable with chronic midthoracic vertebral body compression fracture.  IMPRESSION: Stable cardiomegaly with bilateral pleural effusions, moderate on the right and slightly decreased/small on the left. No acute pulmonary abnormality.   Electronically Signed   By: Ashley Royalty M.D.   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  He was initially hospitalized in January of this year with septic embolization to the brain.  He was hospitalized again in March with acute hypoxemic respiratory failure related to congestive heart failure and pneumonia.  He has done well with medical therapy and  completed his course of antibiotics.  Symptoms of congestive heart failure appear to remain stable although the patient is notably tachycardic on exam and he has a moderate sized residual right pleural effusion on recent chest x-ray.  Transthoracic and transesophageal echocardiograms revealed severe mitral regurgitation.  Further complicating matters include the presence of severely limited mobility secondary to previous history of septic arthritis of the left knee.  At the time of the patient's previous evaluation he was extremely deconditioned.  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   Overall I am impressed that the patient appears to have improved considerably over the past month.  I am concerned by the severity of his mitral regurgitation that his cardiac function may deteriorate and I favor proceeding with surgical intervention sooner rather than later.  I suspect that he might struggle with acute exacerbation of chronic congestive heart failure and possible respiratory failure following mitral valve repair or replacement.  The patient desires to wait at least a few more weeks for a variety of personal reasons including the potential for further improvement in strength and mobility and pending claims for Medicaid application.    Plan:  We have not recommended any change the patient's current medications.  It would be a stretch to say that he is on optimal medical therapy for his underlying congestive heart failure, we will reach out directly to Dr. Radford Pax who is scheduled to see the patient later today for follow-up.  Repeat echocardiogram might be prudent.  Repeat right heart catheterization could be considered as well.  The patient and his mother were counseled at length regarding the indications, risks and potential benefits of mitral valve repair or replacement.  The rationale for elective  surgery has been explained, including a comparison between surgery and continued medical therapy with close follow-up.  The likelihood of successful and durable valve repair has been discussed with particular reference to the findings of their recent echocardiogram.  Based upon these findings and previous experience, I have quoted them a greater than 50 percent likelihood of successful  valve repair.  In the unlikely event that their valve cannot be successfully repaired, we discussed the possibility of replacing the mitral valve using a mechanical prosthesis with the attendant need for long-term anticoagulation versus the alternative of replacing it using a bioprosthetic tissue valve with its potential for late structural valve deterioration and failure, depending upon the patient's longevity.  The patient specifically requests that if the mitral valve must be replaced that it be done using a bioprosthetic tissue valve.   The patient understands and accepts all potential risks of surgery including but not limited to risk of death, stroke or other neurologic complication, myocardial infarction, congestive heart failure, respiratory failure, renal failure, bleeding requiring transfusion and/or reexploration, arrhythmia, infection or other wound complications, pneumonia, pleural and/or pericardial effusion, pulmonary embolus, aortic dissection or other major vascular complication, or delayed complications related to valve repair or replacement including but not limited to structural valve deterioration and failure, thrombosis, embolization, endocarditis, or paravalvular leak.  Alternative surgical approaches have been discussed including a comparison between conventional sternotomy and minimally-invasive techniques.  The relative risks and benefits of each have been reviewed as they pertain to the patient's specific circumstances, and all of their questions have been addressed.  Specific risks potentially related to  the minimally-invasive approach were discussed at length, including but not limited to risk of conversion to full or partial sternotomy, aortic dissection or other major vascular complication, unilateral acute lung injury or pulmonary edema, phrenic nerve dysfunction or paralysis, rib fracture, chronic pain, lung hernia, or lymphocele.  All of their questions have been answered.  We tentatively plan for surgery on Oct 24, 2017.  The patient will return to our office for follow-up prior to surgery on Oct 20, 2017.    I spent in excess of 45 minutes during the conduct of this office consultation and >50% of this time involved direct face-to-face encounter with the patient for counseling and/or coordination of their care.    Valentina Gu. Roxy Manns, MD 09/29/2017 9:51 AM

## 2017-09-29 NOTE — Patient Instructions (Signed)
Stop taking aspirin 2 weeks prior to surgery  Continue taking all other medications without change through the day before surgery.  Have nothing to eat or drink after midnight the night before surgery.  On the morning of surgery do not take any medications

## 2017-09-29 NOTE — Patient Instructions (Addendum)
Medication Instructions:  Your physician recommends that you continue on your current medications as directed. Please refer to the Current Medication list given to you today.  If you need a refill on your cardiac medications, please contact your pharmacy first.  Labwork: Today for kidney function test, complete blood count, thyroid and BNP  Testing/Procedures: Your physician has requested that you have an echocardiogram today. Echocardiography is a painless test that uses sound waves to create images of your heart. It provides your doctor with information about the size and shape of your heart and how well your heart's chambers and valves are working. This procedure takes approximately one hour. There are no restrictions for this procedure.  A chest x-ray takes a picture of the organs and structures inside the chest, including the heart, lungs, and blood vessels. This test can show several things, including, whether the heart is enlarges; whether fluid is building up in the lungs; and whether pacemaker / defibrillator leads are still in place.  Follow-Up: Your physician recommends that you schedule a follow-up appointment in: 2 months with Dr. Sherlyn Lick have been referred to [facility] for severe mitral regurgitation and preop RHC     Any Other Special Instructions Will Be Listed Below (If Applicable).   Thank you for choosing [facility]    Lyda Perone, RN  [phone]  If you need a refill on your cardiac medications before your next appointment, please call your pharmacy.
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
## 2017-09-30 ENCOUNTER — Encounter (HOSPITAL_COMMUNITY): Payer: Self-pay | Admitting: Emergency Medicine

## 2017-09-30 ENCOUNTER — Other Ambulatory Visit: Payer: Self-pay | Admitting: *Deleted

## 2017-09-30 ENCOUNTER — Inpatient Hospital Stay (HOSPITAL_COMMUNITY)
Admission: EM | Admit: 2017-09-30 | Discharge: 2017-10-22 | DRG: 286 | Disposition: A | Discharge status: Home Health | Payer: Medicaid Other | Attending: Cardiology | Admitting: Cardiology

## 2017-09-30 ENCOUNTER — Other Ambulatory Visit: Payer: Self-pay

## 2017-09-30 DIAGNOSIS — Z8661 Personal history of infections of the central nervous system: Secondary | ICD-10-CM

## 2017-09-30 DIAGNOSIS — D72829 Elevated white blood cell count, unspecified: Secondary | ICD-10-CM

## 2017-09-30 DIAGNOSIS — N183 Chronic kidney disease, stage 3 (moderate): Secondary | ICD-10-CM | POA: Diagnosis present

## 2017-09-30 DIAGNOSIS — N179 Acute kidney failure, unspecified: Secondary | ICD-10-CM | POA: Diagnosis present

## 2017-09-30 DIAGNOSIS — Z87891 Personal history of nicotine dependence: Secondary | ICD-10-CM

## 2017-09-30 DIAGNOSIS — Z7982 Long term (current) use of aspirin: Secondary | ICD-10-CM

## 2017-09-30 DIAGNOSIS — I5033 Acute on chronic diastolic (congestive) heart failure: Secondary | ICD-10-CM

## 2017-09-30 DIAGNOSIS — J90 Pleural effusion, not elsewhere classified: Secondary | ICD-10-CM | POA: Diagnosis present

## 2017-09-30 DIAGNOSIS — E871 Hypo-osmolality and hyponatremia: Secondary | ICD-10-CM | POA: Diagnosis not present

## 2017-09-30 DIAGNOSIS — I313 Pericardial effusion (noninflammatory): Secondary | ICD-10-CM | POA: Diagnosis present

## 2017-09-30 DIAGNOSIS — I2510 Atherosclerotic heart disease of native coronary artery without angina pectoris: Secondary | ICD-10-CM | POA: Diagnosis present

## 2017-09-30 DIAGNOSIS — G8929 Other chronic pain: Secondary | ICD-10-CM | POA: Diagnosis present

## 2017-09-30 DIAGNOSIS — M25562 Pain in left knee: Secondary | ICD-10-CM | POA: Diagnosis present

## 2017-09-30 DIAGNOSIS — Z79899 Other long term (current) drug therapy: Secondary | ICD-10-CM | POA: Diagnosis not present

## 2017-09-30 DIAGNOSIS — E876 Hypokalemia: Secondary | ICD-10-CM

## 2017-09-30 DIAGNOSIS — I5023 Acute on chronic systolic (congestive) heart failure: Secondary | ICD-10-CM | POA: Diagnosis present

## 2017-09-30 DIAGNOSIS — B1920 Unspecified viral hepatitis C without hepatic coma: Secondary | ICD-10-CM

## 2017-09-30 DIAGNOSIS — R000 Tachycardia, unspecified: Secondary | ICD-10-CM

## 2017-09-30 DIAGNOSIS — I69354 Hemiplegia and hemiparesis following cerebral infarction affecting left non-dominant side: Secondary | ICD-10-CM

## 2017-09-30 DIAGNOSIS — A0471 Enterocolitis due to Clostridium difficile, recurrent: Secondary | ICD-10-CM

## 2017-09-30 DIAGNOSIS — M545 Low back pain: Secondary | ICD-10-CM | POA: Diagnosis present

## 2017-09-30 DIAGNOSIS — D62 Acute posthemorrhagic anemia: Secondary | ICD-10-CM

## 2017-09-30 DIAGNOSIS — I6930 Unspecified sequelae of cerebral infarction: Secondary | ICD-10-CM

## 2017-09-30 DIAGNOSIS — Z539 Procedure and treatment not carried out, unspecified reason: Secondary | ICD-10-CM | POA: Diagnosis not present

## 2017-09-30 DIAGNOSIS — I130 Hypertensive heart and chronic kidney disease with heart failure and stage 1 through stage 4 chronic kidney disease, or unspecified chronic kidney disease: Principal | ICD-10-CM | POA: Diagnosis present

## 2017-09-30 DIAGNOSIS — I340 Nonrheumatic mitral (valve) insufficiency: Secondary | ICD-10-CM

## 2017-09-30 DIAGNOSIS — R0682 Tachypnea, not elsewhere classified: Secondary | ICD-10-CM

## 2017-09-30 DIAGNOSIS — Z8249 Family history of ischemic heart disease and other diseases of the circulatory system: Secondary | ICD-10-CM | POA: Diagnosis not present

## 2017-09-30 DIAGNOSIS — M009 Pyogenic arthritis, unspecified: Secondary | ICD-10-CM

## 2017-09-30 DIAGNOSIS — I4891 Unspecified atrial fibrillation: Secondary | ICD-10-CM

## 2017-09-30 DIAGNOSIS — I509 Heart failure, unspecified: Secondary | ICD-10-CM

## 2017-09-30 DIAGNOSIS — I272 Pulmonary hypertension, unspecified: Secondary | ICD-10-CM | POA: Diagnosis present

## 2017-09-30 DIAGNOSIS — I4892 Unspecified atrial flutter: Secondary | ICD-10-CM | POA: Diagnosis present

## 2017-09-30 DIAGNOSIS — Z8679 Personal history of other diseases of the circulatory system: Secondary | ICD-10-CM

## 2017-09-30 DIAGNOSIS — I5032 Chronic diastolic (congestive) heart failure: Secondary | ICD-10-CM | POA: Diagnosis present

## 2017-09-30 DIAGNOSIS — R140 Abdominal distension (gaseous): Secondary | ICD-10-CM

## 2017-09-30 LAB — CBC
HEMATOCRIT: 33.4 % — AB (ref 37.5–51.0)
HEMATOCRIT: 31.7 % — AB (ref 39.0–52.0)
HEMOGLOBIN: 10.4 g/dL — AB (ref 13.0–17.7)
Hemoglobin: 9.9 g/dL — ABNORMAL LOW (ref 13.0–17.0)
MCH: 26.8 pg (ref 26.6–33.0)
MCH: 26.2 pg (ref 26.0–34.0)
MCHC: 31.1 g/dL — AB (ref 31.5–35.7)
MCHC: 31.2 g/dL (ref 30.0–36.0)
MCV: 86 fL (ref 79–97)
MCV: 83.9 fL (ref 78.0–100.0)
Platelets: 305 10*3/uL (ref 150–379)
Platelets: 268 10*3/uL (ref 150–400)
RBC: 3.88 x10E6/uL — ABNORMAL LOW (ref 4.14–5.80)
RBC: 3.78 MIL/uL — AB (ref 4.22–5.81)
RDW: 16.1 % — AB (ref 12.3–15.4)
RDW: 16.5 % — AB (ref 11.5–15.5)
WBC: 11.2 10*3/uL — ABNORMAL HIGH (ref 3.4–10.8)
WBC: 11.4 10*3/uL — AB (ref 4.0–10.5)

## 2017-09-30 LAB — CBC WITH DIFFERENTIAL/PLATELET
Basophils Absolute: 0 10*3/uL (ref 0.0–0.1)
Basophils Relative: 0 %
EOS ABS: 0.4 10*3/uL (ref 0.0–0.7)
EOS PCT: 4 %
HCT: 31.7 % — ABNORMAL LOW (ref 39.0–52.0)
Hemoglobin: 9.9 g/dL — ABNORMAL LOW (ref 13.0–17.0)
LYMPHS ABS: 2 10*3/uL (ref 0.7–4.0)
LYMPHS PCT: 17 %
MCH: 26.4 pg (ref 26.0–34.0)
MCHC: 31.2 g/dL (ref 30.0–36.0)
MCV: 84.5 fL (ref 78.0–100.0)
MONO ABS: 0.7 10*3/uL (ref 0.1–1.0)
MONOS PCT: 6 %
Neutro Abs: 8.3 10*3/uL — ABNORMAL HIGH (ref 1.7–7.7)
Neutrophils Relative %: 73 %
PLATELETS: 248 10*3/uL (ref 150–400)
RBC: 3.75 MIL/uL — ABNORMAL LOW (ref 4.22–5.81)
RDW: 16.4 % — AB (ref 11.5–15.5)
WBC: 11.4 10*3/uL — AB (ref 4.0–10.5)

## 2017-09-30 LAB — COMPREHENSIVE METABOLIC PANEL
ALK PHOS: 106 U/L (ref 38–126)
ALT: 33 U/L (ref 17–63)
ANION GAP: 9 (ref 5–15)
AST: 38 U/L (ref 15–41)
Albumin: 2.8 g/dL — ABNORMAL LOW (ref 3.5–5.0)
BILIRUBIN TOTAL: 0.6 mg/dL (ref 0.3–1.2)
BUN: 21 mg/dL — ABNORMAL HIGH (ref 6–20)
CALCIUM: 8.8 mg/dL — AB (ref 8.9–10.3)
CO2: 23 mmol/L (ref 22–32)
Chloride: 103 mmol/L (ref 101–111)
Creatinine, Ser: 1.44 mg/dL — ABNORMAL HIGH (ref 0.61–1.24)
GFR calc Af Amer: >60 mL/min (ref >60)
GFR calc non Af Amer: 54 mL/min — ABNORMAL LOW (ref >60)
GLUCOSE: 97 mg/dL (ref 65–99)
POTASSIUM: 4.2 mmol/L (ref 3.5–5.1)
Sodium: 135 mmol/L (ref 135–145)
Total Protein: 7.5 g/dL (ref 6.5–8.1)

## 2017-09-30 LAB — BASIC METABOLIC PANEL
BUN / CREAT RATIO: 12 (ref 9–20)
BUN: 21 mg/dL (ref 6–24)
CALCIUM: 9.2 mg/dL (ref 8.7–10.2)
CHLORIDE: 101 mmol/L (ref 96–106)
CO2: 23 mmol/L (ref 20–29)
CREATININE: 1.82 mg/dL — AB (ref 0.76–1.27)
GFR, EST AFRICAN AMERICAN: 48 mL/min/{1.73_m2} — AB (ref >59)
GFR, EST NON AFRICAN AMERICAN: 42 mL/min/{1.73_m2} — AB (ref >59)
Glucose: 82 mg/dL (ref 65–99)
Potassium: 4.2 mmol/L (ref 3.5–5.2)
Sodium: 138 mmol/L (ref 134–144)

## 2017-09-30 LAB — CREATININE, SERUM
Creatinine, Ser: 1.67 mg/dL — ABNORMAL HIGH (ref 0.61–1.24)
GFR, EST AFRICAN AMERICAN: 53 mL/min — AB (ref >60)
GFR, EST NON AFRICAN AMERICAN: 46 mL/min — AB (ref >60)

## 2017-09-30 LAB — PRO B NATRIURETIC PEPTIDE: NT-PRO BNP: 13809 pg/mL — AB (ref 0–121)

## 2017-09-30 LAB — TSH: TSH: 3.11 u[IU]/mL (ref 0.450–4.500)

## 2017-09-30 LAB — TROPONIN I

## 2017-09-30 LAB — BRAIN NATRIURETIC PEPTIDE: B Natriuretic Peptide: 706.9 pg/mL — ABNORMAL HIGH (ref 0.0–100.0)

## 2017-09-30 MED ORDER — SODIUM CHLORIDE 0.9 % IV SOLN: 250.0000 mL | INTRAVENOUS | Status: DC | PRN

## 2017-09-30 MED ORDER — ONDANSETRON HCL 4 MG/2ML IJ SOLN: 4.0000 mg | Freq: Four times a day (QID) | INTRAMUSCULAR | Status: DC | PRN

## 2017-09-30 MED ORDER — SODIUM CHLORIDE 0.9% FLUSH
3.0000 mL | Freq: Two times a day (BID) | INTRAVENOUS | Status: DC
  Administered 2017-09-30 – 2017-10-01 (×2): 3 mL via INTRAVENOUS

## 2017-09-30 MED ORDER — ACETAMINOPHEN 325 MG PO TABS: 650.0000 mg | ORAL_TABLET | Freq: Four times a day (QID) | ORAL | Status: DC | PRN

## 2017-09-30 MED ORDER — ASPIRIN EC 81 MG PO TBEC
81.0000 mg | DELAYED_RELEASE_TABLET | Freq: Every day | ORAL | Status: DC
  Administered 2017-10-02 – 2017-10-07 (×6): 81 mg via ORAL
  Filled 2017-09-30 (×6): qty 1

## 2017-09-30 MED ORDER — ACETAMINOPHEN 325 MG PO TABS: 650.0000 mg | ORAL_TABLET | ORAL | Status: DC | PRN

## 2017-09-30 MED ORDER — SODIUM CHLORIDE 0.9% FLUSH
3.0000 mL | Freq: Two times a day (BID) | INTRAVENOUS | Status: DC
  Administered 2017-09-30 – 2017-10-22 (×22): 3 mL via INTRAVENOUS

## 2017-09-30 MED ORDER — FUROSEMIDE 10 MG/ML IJ SOLN
80.0000 mg | Freq: Two times a day (BID) | INTRAMUSCULAR | Status: DC
  Administered 2017-10-01 – 2017-10-03 (×5): 80 mg via INTRAVENOUS
  Filled 2017-09-30 (×5): qty 8

## 2017-09-30 MED ORDER — SODIUM CHLORIDE 0.9% FLUSH: 3.0000 mL | INTRAVENOUS | Status: DC | PRN

## 2017-09-30 MED ORDER — ATORVASTATIN CALCIUM 20 MG PO TABS
20.0000 mg | ORAL_TABLET | Freq: Every day | ORAL | Status: DC
  Administered 2017-09-30 – 2017-10-07 (×8): 20 mg via ORAL
  Filled 2017-09-30 (×9): qty 1

## 2017-09-30 MED ORDER — ENOXAPARIN SODIUM 30 MG/0.3ML ~~LOC~~ SOLN
30.0000 mg | SUBCUTANEOUS | Status: DC
  Administered 2017-09-30: 30 mg via SUBCUTANEOUS
  Filled 2017-09-30: qty 0.3

## 2017-09-30 MED ORDER — VITAMIN B-1 100 MG PO TABS
100.0000 mg | ORAL_TABLET | Freq: Every day | ORAL | Status: DC
  Administered 2017-10-01 – 2017-10-22 (×22): 100 mg via ORAL
  Filled 2017-09-30 (×22): qty 1

## 2017-09-30 MED ORDER — ASPIRIN 81 MG PO CHEW
81.0000 mg | CHEWABLE_TABLET | ORAL | Status: AC
  Administered 2017-10-01: 81 mg via ORAL
  Filled 2017-09-30: qty 1

## 2017-09-30 MED ORDER — SODIUM CHLORIDE 0.9 % IV SOLN: INTRAVENOUS | Status: DC

## 2017-09-30 MED ORDER — GABAPENTIN 300 MG PO CAPS
300.0000 mg | ORAL_CAPSULE | Freq: Three times a day (TID) | ORAL | Status: DC
  Administered 2017-09-30 – 2017-10-22 (×65): 300 mg via ORAL
  Filled 2017-09-30 (×4): qty 1
  Filled 2017-09-30: qty 3
  Filled 2017-09-30 (×60): qty 1

## 2017-09-30 MED ORDER — OXYCODONE HCL 5 MG PO TABS
5.0000 mg | ORAL_TABLET | Freq: Four times a day (QID) | ORAL | Status: DC | PRN
  Administered 2017-09-30 – 2017-10-22 (×56): 5 mg via ORAL
  Filled 2017-09-30 (×58): qty 1

## 2017-09-30 MED ORDER — FUROSEMIDE 10 MG/ML IJ SOLN
80.0000 mg | Freq: Once | INTRAMUSCULAR | Status: AC
  Administered 2017-09-30: 80 mg via INTRAVENOUS
  Filled 2017-09-30: qty 8

## 2017-09-30 NOTE — ED Notes (Signed)
Got patient undressed on the monitor patient is resting with call bell in reach and family at bedside
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
## 2017-09-30 NOTE — ED Notes (Signed)
Attempted report x1. 

## 2017-09-30 NOTE — ED Triage Notes (Signed)
Patient arrived via EMS -reports that he was at the doctor's office yesterday to get his pre-op labs  for "Mitral Valve". - he reports that he received a call from the pre-op labs results to come to the hospital today for "further evaluation for Heart Failure Clinic"

## 2017-10-01 ENCOUNTER — Encounter (HOSPITAL_COMMUNITY): Payer: Self-pay

## 2017-10-01 ENCOUNTER — Inpatient Hospital Stay (HOSPITAL_COMMUNITY)
Admission: EM | Disposition: A | Discharge status: Home Health | Payer: Self-pay | Source: Home / Self Care | Attending: Cardiology

## 2017-10-01 ENCOUNTER — Other Ambulatory Visit: Payer: Self-pay | Admitting: *Deleted

## 2017-10-01 DIAGNOSIS — N183 Chronic kidney disease, stage 3 (moderate): Secondary | ICD-10-CM

## 2017-10-01 DIAGNOSIS — I509 Heart failure, unspecified: Secondary | ICD-10-CM

## 2017-10-01 DIAGNOSIS — R7881 Bacteremia: Secondary | ICD-10-CM

## 2017-10-01 DIAGNOSIS — I340 Nonrheumatic mitral (valve) insufficiency: Secondary | ICD-10-CM

## 2017-10-01 HISTORY — PX: RIGHT HEART CATH: CATH118263

## 2017-10-01 LAB — BASIC METABOLIC PANEL
Anion gap: 10 (ref 5–15)
BUN: 25 mg/dL — ABNORMAL HIGH (ref 6–20)
CALCIUM: 8.9 mg/dL (ref 8.9–10.3)
CHLORIDE: 99 mmol/L — AB (ref 101–111)
CO2: 26 mmol/L (ref 22–32)
CREATININE: 1.51 mg/dL — AB (ref 0.61–1.24)
GFR calc Af Amer: 60 mL/min — ABNORMAL LOW (ref >60)
GFR calc non Af Amer: 51 mL/min — ABNORMAL LOW (ref >60)
GLUCOSE: 109 mg/dL — AB (ref 65–99)
Potassium: 3.7 mmol/L (ref 3.5–5.1)
Sodium: 135 mmol/L (ref 135–145)

## 2017-10-01 LAB — MRSA PCR SCREENING: MRSA BY PCR: NEGATIVE

## 2017-10-01 LAB — POCT I-STAT 3, VENOUS BLOOD GAS (G3P V)
Acid-Base Excess: 2 mmol/L (ref 0.0–2.0)
BICARBONATE: 24 mmol/L (ref 20.0–28.0)
BICARBONATE: 26.2 mmol/L (ref 20.0–28.0)
O2 Saturation: 51 %
O2 Saturation: 52 %
PCO2 VEN: 36.8 mmHg — AB (ref 44.0–60.0)
PO2 VEN: 26 mmHg — AB (ref 32.0–45.0)
PO2 VEN: 26 mmHg — AB (ref 32.0–45.0)
TCO2: 25 mmol/L (ref 22–32)
TCO2: 27 mmol/L (ref 22–32)
pCO2, Ven: 35.2 mmHg — ABNORMAL LOW (ref 44.0–60.0)
pH, Ven: 7.442 — ABNORMAL HIGH (ref 7.250–7.430)
pH, Ven: 7.46 — ABNORMAL HIGH (ref 7.250–7.430)

## 2017-10-01 LAB — CBC
HEMATOCRIT: 33.8 % — AB (ref 39.0–52.0)
Hemoglobin: 10.5 g/dL — ABNORMAL LOW (ref 13.0–17.0)
MCH: 26 pg (ref 26.0–34.0)
MCHC: 31.1 g/dL (ref 30.0–36.0)
MCV: 83.7 fL (ref 78.0–100.0)
PLATELETS: 277 10*3/uL (ref 150–400)
RBC: 4.04 MIL/uL — ABNORMAL LOW (ref 4.22–5.81)
RDW: 16.4 % — AB (ref 11.5–15.5)
WBC: 10.9 10*3/uL — AB (ref 4.0–10.5)

## 2017-10-01 LAB — PROTIME-INR
INR: 1.21
Prothrombin Time: 15.2 seconds (ref 11.4–15.2)

## 2017-10-01 SURGERY — RIGHT HEART CATH
Anesthesia: LOCAL

## 2017-10-01 MED ORDER — SODIUM CHLORIDE 0.9% FLUSH
3.0000 mL | Freq: Two times a day (BID) | INTRAVENOUS | Status: DC
  Administered 2017-10-01 – 2017-10-21 (×30): 3 mL via INTRAVENOUS

## 2017-10-01 MED ORDER — SODIUM CHLORIDE 0.9 % IV SOLN
INTRAVENOUS | Status: DC | PRN
  Administered 2017-10-01: 10 mL/h via INTRAVENOUS

## 2017-10-01 MED ORDER — POTASSIUM CHLORIDE CRYS ER 20 MEQ PO TBCR
40.0000 meq | EXTENDED_RELEASE_TABLET | Freq: Once | ORAL | Status: AC
  Administered 2017-10-01: 40 meq via ORAL
  Filled 2017-10-01: qty 2

## 2017-10-01 MED ORDER — SODIUM CHLORIDE 0.9 % IV SOLN: INTRAVENOUS | Status: DC

## 2017-10-01 MED ORDER — SODIUM CHLORIDE 0.9% FLUSH: 3.0000 mL | INTRAVENOUS | Status: DC | PRN

## 2017-10-01 MED ORDER — ENOXAPARIN SODIUM 30 MG/0.3ML ~~LOC~~ SOLN
30.0000 mg | SUBCUTANEOUS | Status: DC
  Administered 2017-10-02: 30 mg via SUBCUTANEOUS
  Filled 2017-10-01: qty 0.3

## 2017-10-01 MED ORDER — HEPARIN (PORCINE) IN NACL 2-0.9 UNITS/ML
INTRAMUSCULAR | Status: AC | PRN
  Administered 2017-10-01 (×2): 500 mL

## 2017-10-01 MED ORDER — ACETAMINOPHEN 325 MG PO TABS
650.0000 mg | ORAL_TABLET | ORAL | Status: DC | PRN
  Administered 2017-10-01 – 2017-10-15 (×4): 650 mg via ORAL
  Filled 2017-10-01 (×5): qty 2

## 2017-10-01 MED ORDER — ONDANSETRON HCL 4 MG/2ML IJ SOLN
4.0000 mg | Freq: Four times a day (QID) | INTRAMUSCULAR | Status: DC | PRN
  Administered 2017-10-01 – 2017-10-03 (×2): 4 mg via INTRAVENOUS
  Filled 2017-10-01 (×3): qty 2

## 2017-10-01 MED ORDER — LIDOCAINE HCL (PF) 1 % IJ SOLN
INTRAMUSCULAR | Status: DC | PRN
  Administered 2017-10-01: 2 mL

## 2017-10-01 MED ORDER — HEPARIN (PORCINE) IN NACL 1000-0.9 UT/500ML-% IV SOLN
INTRAVENOUS | Status: AC
  Filled 2017-10-01: qty 500

## 2017-10-01 MED ORDER — LIDOCAINE HCL (PF) 1 % IJ SOLN
INTRAMUSCULAR | Status: AC
  Filled 2017-10-01: qty 30

## 2017-10-01 MED ORDER — SODIUM CHLORIDE 0.9 % IV SOLN: 250.0000 mL | INTRAVENOUS | Status: DC | PRN

## 2017-10-01 MED ORDER — IOPAMIDOL (ISOVUE-370) INJECTION 76%
INTRAVENOUS | Status: AC
  Filled 2017-10-01: qty 125

## 2017-10-01 SURGICAL SUPPLY — 11 items
CATH BALLN WEDGE 5F 110CM (CATHETERS) ×1 IMPLANT
CATH VISTA GUIDE 6FR AL1 SH (CATHETERS) IMPLANT
CATH VISTA GUIDE 6FR AL2 (CATHETERS) IMPLANT
CATH VISTA GUIDE 6FR AL2 SH (CATHETERS) IMPLANT
CATH VISTA GUIDE 6FR JL4 SH (CATHETERS) IMPLANT
GUIDEWIRE .025 260CM (WIRE) ×1 IMPLANT
KIT HEART LEFT (KITS) ×2 IMPLANT
PACK CARDIAC CATHETERIZATION (CUSTOM PROCEDURE TRAY) ×2 IMPLANT
SHEATH RAIN 4/5FR (SHEATH) ×1 IMPLANT
TRANSDUCER W/STOPCOCK (MISCELLANEOUS) ×2 IMPLANT
TUBING CIL FLEX 10 FLL-RA (TUBING) ×2 IMPLANT

## 2017-10-01 NOTE — Interval H&P Note (Signed)
History and Physical Interval Note:  10/01/2017 10:17 AM  Marcus [name]  has presented today for surgery, with the diagnosis of hf  The various methods of treatment have been discussed with the patient and family. After consideration of risks, benefits and other options for treatment, the patient has consented to  Procedure(s): RIGHT HEART CATH (N/A) as a surgical intervention .  The patient's history has been reviewed, patient examined, no change in status, stable for surgery.  I have reviewed the patient's chart and labs.  Questions were answered to the patient's satisfaction.     [employer]

## 2017-10-01 NOTE — Care Management Note (Signed)
Case Management Note  Patient Details  Name: Marcus [name] MRN: [id number] Date of Birth: September 27, 1965  Subjective/Objective:  Pt admitted with HF, AKI and elevated BNP                  Action/Plan:  PTA from home with parent.  Pt previously admitted for endocarditis - plan is for heart cath today.  Pt is active with [facility] and has received information for orange card application from clinic.  Pt may require MATCH letter at discharge if he hasn't already used benefit this calendar year.  CM will continue to follow   Expected Discharge Date:                  Expected Discharge Plan:     In-House Referral:  Clinical Social Work  Discharge planning Services  CM Consult  Post Acute Care Choice:    Choice offered to:     DME Arranged:    DME Agency:     HH Arranged:    HH Agency:     Status of Service:     If discussed at [employer], dates discussed:    Additional Comments:  Cherylann Parr, RN 10/01/2017, 10:26 AM

## 2017-10-01 NOTE — Progress Notes (Signed)
Patient ID: Marcus [name], male   DOB: [birth year], 52 y.o.   MRN: [id number]     Advanced Heart Failure Rounding Note  PCP-Cardiologist: Armanda Magic, MD  HF Cardiology: Shirlee Latch  Subjective:    Breathing better this morning.  He diuresed well yesterday, weight coming down.     Objective:   Weight Range: 148 lb (67.1 kg) Body mass index is 20.64 kg/m.   Vital Signs:   Temp:  [98.2 F (36.8 C)-98.7 F (37.1 C)] 98.5 F (36.9 C) (05/01 0320) Pulse Rate:  [100-114] 101 (05/01 0320) Resp:  [15-28] 15 (05/01 0320) BP: (93-108)/(71-84) 93/76 (05/01 0320) SpO2:  [91 %-96 %] 91 % (05/01 0320) FiO2 (%):  [0 %] 0 % (04/30 2034) Weight:  [148 lb (67.1 kg)-155 lb 3.3 oz (70.4 kg)] 148 lb (67.1 kg) (05/01 0606) Last BM Date: 09/30/17  Weight change: Filed Weights   09/30/17 2005 09/30/17 2327 10/01/17 0606  Weight: 151 lb 0.2 oz (68.5 kg) 155 lb 3.3 oz (70.4 kg) 148 lb (67.1 kg)    Intake/Output:   Intake/Output Summary (Last 24 hours) at 10/01/2017 0748 Last data filed at 10/01/2017 0236 Gross per 24 hour  Intake 3 ml  Output 3750 ml  Net -3747 ml      Physical Exam    General:  Well appearing. No resp difficulty HEENT: Normal Neck: Supple. JVP 12 cm. Carotids 2+ bilat; no bruits. No lymphadenopathy or thyromegaly appreciated. Cor: PMI nondisplaced. Mildly tachy, regular rate & rhythm. No S3/S4, 3/6 HSM apex.  Lungs: Clear Abdomen: Soft, nontender, nondistended. No hepatosplenomegaly. No bruits or masses. Good bowel sounds. Extremities: No cyanosis, clubbing, rash.  1+ ankle edema.  Neuro: Alert & orientedx3, cranial nerves grossly intact. moves all 4 extremities w/o difficulty. Affect pleasant   Telemetry   NSR 90s-100s, personally reviewed  Labs    CBC Recent Labs    09/30/17 1506 09/30/17 2042  WBC 11.4* 11.4*  NEUTROABS 8.3*  --   HGB 9.9* 9.9*  HCT 31.7* 31.7*  MCV 84.5 83.9  PLT 248 268   Basic Metabolic Panel Recent Labs    40/98/11 1506  09/30/17 2042 10/01/17 0216  NA 135  --  135  K 4.2  --  3.7  CL 103  --  99*  CO2 23  --  26  GLUCOSE 97  --  109*  BUN 21*  --  25*  CREATININE 1.44* 1.67* 1.51*  CALCIUM 8.8*  --  8.9   Liver Function Tests Recent Labs    09/30/17 1506  AST 38  ALT 33  ALKPHOS 106  BILITOT 0.6  PROT 7.5  ALBUMIN 2.8*   No results for input(s): LIPASE, AMYLASE in the last 72 hours. Cardiac Enzymes Recent Labs    09/30/17 1506  TROPONINI <0.03    BNP: BNP (last 3 results) Recent Labs    08/09/17 1607 09/30/17 1500  BNP 449.0* 706.9*    ProBNP (last 3 results) Recent Labs    09/29/17 1350  PROBNP 13,809*     D-Dimer No results for input(s): DDIMER in the last 72 hours. Hemoglobin A1C No results for input(s): HGBA1C in the last 72 hours. Fasting Lipid Panel No results for input(s): CHOL, HDL, LDLCALC, TRIG, CHOLHDL, LDLDIRECT in the last 72 hours. Thyroid Function Tests Recent Labs    09/29/17 1350  TSH 3.110    Other results:   Imaging     No results found.   Medications:     Scheduled  Medications: . [START ON 10/02/2017] aspirin EC  81 mg Oral Daily  . atorvastatin  20 mg Oral q1800  . enoxaparin (LOVENOX) injection  30 mg Subcutaneous Q24H  . furosemide  80 mg Intravenous BID  . gabapentin  300 mg Oral TID  . potassium chloride  40 mEq Oral Once  . sodium chloride flush  3 mL Intravenous Q12H  . sodium chloride flush  3 mL Intravenous Q12H  . thiamine  100 mg Oral Daily     Infusions: . sodium chloride    . sodium chloride    . [START ON 10/02/2017] sodium chloride       PRN Medications:  sodium chloride, sodium chloride, acetaminophen, ondansetron (ZOFRAN) IV, oxyCODONE, sodium chloride flush, sodium chloride flush   Patient Profile   Marcus [name] is a 52 y.o. 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    Assessment/Plan   1. Acute diastolic CHF: Due to severe mitral regurgitation from perforated anterior leaflet.  He was admitted markedly volume overloaded on exam.  Symptoms were baseline, NYHA class III, likely due to a combination of deconditioning and CHF.  Echo showed that LV EF remains normal at 55-60%.  However, RV has mild to moderate systolic dysfunction. He diuresed well yesterday, weight down.  Creatinine fairly stable at 1.5.  Still volume overloaded on exam today.   - Lasix 80 mg IV bid again today, replace K.  - Plan RHC today.  Discussed risks/benefits with patient and he agrees to proceed.  2. Severe mitral regurgitation: Due to prior endocarditis with MSSA.  I reviewed echo and TEE, he has a large anterior leaflet perforation and probably a chordal rupture.  - Diuresis to treat CHF.  - Soft BP, would hold off on afterload reduction for the time being.  - Once he is well-diuresed, he will need MV repair.  Have alerted Dr. Owen that he has been admitted.  He had coronary angiography during a prior hospitalization, no obstructive CAD.  3. 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  He has had a full course of antibiotic treatment and PICC is now out.  He is afebrile, minimally elevated WBCs.  - Blood cultures sent, results pending.  4. H/o septic arthritis: Left knee.  This significantly limits his mobility.  - PT/OT consults.  5. Right pleural effusion: Moderate on CXR this admission.  Chronic, likely due to CHF.  Treatment for now will be diuresis.  6. Polysubstance abuse: Prior use of opioids.   7. CKD: Stage 3.  Watch closely with diuresis.   Length of Stay: 1  Sumeet Geter, MD  10/01/2017, 7:48 AM  Advanced Heart Failure Team Pager [phone] (M-F; 7a - 4p)  Please contact  [facility] for night-coverage after hours (4p -7a ) and weekends on [email or web address]  

## 2017-10-01 NOTE — Progress Notes (Signed)
OT Cancellation Note  Patient Details Name: Marcus [name] MRN: [id number] DOB: 01-15-1966   Cancelled Treatment:    Reason Eval/Treat Not Completed: Patient at procedure or test/ unavailable. Pt at Cath lab, OT will continue to follow for eval as schedule allows.   Evern Bio Alayna Mabe 10/01/2017, 10:39 AM  Sherryl Manges OTR/L [phone]

## 2017-10-01 NOTE — H&P (View-Only) (Signed)
Patient ID: Marcus [name], male   DOB: [birth year], 52 y.o.   MRN: [id number]     Advanced Heart Failure Rounding Note  PCP-Cardiologist: Armanda Magic, MD  HF Cardiology: Shirlee Latch  Subjective:    Breathing better this morning.  He diuresed well yesterday, weight coming down.     Objective:   Weight Range: 148 lb (67.1 kg) Body mass index is 20.64 kg/m.   Vital Signs:   Temp:  [98.2 F (36.8 C)-98.7 F (37.1 C)] 98.5 F (36.9 C) (05/01 0320) Pulse Rate:  [100-114] 101 (05/01 0320) Resp:  [15-28] 15 (05/01 0320) BP: (93-108)/(71-84) 93/76 (05/01 0320) SpO2:  [91 %-96 %] 91 % (05/01 0320) FiO2 (%):  [0 %] 0 % (04/30 2034) Weight:  [148 lb (67.1 kg)-155 lb 3.3 oz (70.4 kg)] 148 lb (67.1 kg) (05/01 0606) Last BM Date: 09/30/17  Weight change: Filed Weights   09/30/17 2005 09/30/17 2327 10/01/17 0606  Weight: 151 lb 0.2 oz (68.5 kg) 155 lb 3.3 oz (70.4 kg) 148 lb (67.1 kg)    Intake/Output:   Intake/Output Summary (Last 24 hours) at 10/01/2017 0748 Last data filed at 10/01/2017 0236 Gross per 24 hour  Intake 3 ml  Output 3750 ml  Net -3747 ml      Physical Exam    General:  Well appearing. No resp difficulty HEENT: Normal Neck: Supple. JVP 12 cm. Carotids 2+ bilat; no bruits. No lymphadenopathy or thyromegaly appreciated. Cor: PMI nondisplaced. Mildly tachy, regular rate & rhythm. No S3/S4, 3/6 HSM apex.  Lungs: Clear Abdomen: Soft, nontender, nondistended. No hepatosplenomegaly. No bruits or masses. Good bowel sounds. Extremities: No cyanosis, clubbing, rash.  1+ ankle edema.  Neuro: Alert & orientedx3, cranial nerves grossly intact. moves all 4 extremities w/o difficulty. Affect pleasant   Telemetry   NSR 90s-100s, personally reviewed  Labs    CBC Recent Labs    09/30/17 1506 09/30/17 2042  WBC 11.4* 11.4*  NEUTROABS 8.3*  --   HGB 9.9* 9.9*  HCT 31.7* 31.7*  MCV 84.5 83.9  PLT 248 268   Basic Metabolic Panel Recent Labs    40/98/11 1506  09/30/17 2042 10/01/17 0216  NA 135  --  135  K 4.2  --  3.7  CL 103  --  99*  CO2 23  --  26  GLUCOSE 97  --  109*  BUN 21*  --  25*  CREATININE 1.44* 1.67* 1.51*  CALCIUM 8.8*  --  8.9   Liver Function Tests Recent Labs    09/30/17 1506  AST 38  ALT 33  ALKPHOS 106  BILITOT 0.6  PROT 7.5  ALBUMIN 2.8*   No results for input(s): LIPASE, AMYLASE in the last 72 hours. Cardiac Enzymes Recent Labs    09/30/17 1506  TROPONINI <0.03    BNP: BNP (last 3 results) Recent Labs    08/09/17 1607 09/30/17 1500  BNP 449.0* 706.9*    ProBNP (last 3 results) Recent Labs    09/29/17 1350  PROBNP 13,809*     D-Dimer No results for input(s): DDIMER in the last 72 hours. Hemoglobin A1C No results for input(s): HGBA1C in the last 72 hours. Fasting Lipid Panel No results for input(s): CHOL, HDL, LDLCALC, TRIG, CHOLHDL, LDLDIRECT in the last 72 hours. Thyroid Function Tests Recent Labs    09/29/17 1350  TSH 3.110    Other results:   Imaging     No results found.   Medications:     Scheduled  Medications: . [START ON 10/02/2017] aspirin EC  81 mg Oral Daily  . atorvastatin  20 mg Oral q1800  . enoxaparin (LOVENOX) injection  30 mg Subcutaneous Q24H  . furosemide  80 mg Intravenous BID  . gabapentin  300 mg Oral TID  . potassium chloride  40 mEq Oral Once  . sodium chloride flush  3 mL Intravenous Q12H  . sodium chloride flush  3 mL Intravenous Q12H  . thiamine  100 mg Oral Daily     Infusions: . sodium chloride    . sodium chloride    . [START ON 10/02/2017] sodium chloride       PRN Medications:  sodium chloride, sodium chloride, acetaminophen, ondansetron (ZOFRAN) IV, oxyCODONE, sodium chloride flush, sodium chloride flush   Patient Profile   Marcus [name] is a 52 y.o. 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    Assessment/Plan   1. Acute diastolic CHF: Due to severe mitral regurgitation from perforated anterior leaflet.  He was admitted markedly volume overloaded on exam.  Symptoms were baseline, NYHA class III, likely due to a combination of deconditioning and CHF.  Echo showed that LV EF remains normal at 55-60%.  However, RV has mild to moderate systolic dysfunction. He diuresed well yesterday, weight down.  Creatinine fairly stable at 1.5.  Still volume overloaded on exam today.   - Lasix 80 mg IV bid again today, replace K.  - Plan RHC today.  Discussed risks/benefits with patient and he agrees to proceed.  2. Severe mitral regurgitation: Due to prior endocarditis with MSSA.  I reviewed echo and TEE, he has a large anterior leaflet perforation and probably a chordal rupture.  - Diuresis to treat CHF.  - Soft BP, would hold off on afterload reduction for the time being.  - Once he is well-diuresed, he will need MV repair.  Have alerted Dr. Cornelius Moras that he has been admitted.  He had coronary angiography during a prior hospitalization, no obstructive CAD.  3. 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  He has had a full course of antibiotic treatment and PICC is now out.  He is afebrile, minimally elevated WBCs.  - Blood cultures sent, results pending.  4. H/o septic arthritis: Left knee.  This significantly limits his mobility.  - PT/OT consults.  5. Right pleural effusion: Moderate on CXR this admission.  Chronic, likely due to CHF.  Treatment for now will be diuresis.  6. Polysubstance abuse: Prior use of opioids.   7. CKD: Stage 3.  Watch closely with diuresis.   Length of Stay: 1  Marca Ancona, MD  10/01/2017, 7:48 AM  Advanced Heart Failure Team Pager [phone] (M-F; 7a - 4p)  Please contact  [facility] for night-coverage after hours (4p -7a ) and weekends on [email or web address]

## 2017-10-01 NOTE — Progress Notes (Signed)
[address]       [place] [address]             [phone]     CARDIOTHORACIC SURGERY PROGRESS NOTE  Day of Surgery  S/P Procedure(s) (LRB): RIGHT HEART CATH (N/A)  Subjective: Reports feeling okay.  No complaints.  Breathing improved  Objective: Vital signs in last 24 hours: Temp:  [98.2 F (36.8 C)-98.5 F (36.9 C)] 98.4 F (36.9 C) (05/01 1138) Pulse Rate:  [100-114] 103 (05/01 1138) Cardiac Rhythm: Sinus tachycardia (05/01 0800) Resp:  [9-32] 24 (05/01 1138) BP: (93-108)/(71-84) 94/74 (05/01 1138) SpO2:  [91 %-97 %] 94 % (05/01 1138) FiO2 (%):  [0 %] 0 % (04/30 2034) Weight:  [148 lb (67.1 kg)-155 lb 3.3 oz (70.4 kg)] 148 lb (67.1 kg) (05/01 0606)  Physical Exam:  Rhythm:   Sinus tach  Breath sounds: Bibasilar crackles  Heart sounds:  Tachy w/ holosystolic murmur  Incisions:  n/a  Abdomen:  soft  Extremities:  Mild edema   Intake/Output from previous day: 04/30 0701 - 05/01 0700 In: 3 [I.V.:3] Out: 3750 [Urine:3750] Intake/Output this shift: Total I/O In: -  Out: 1500 [Urine:1500]  Lab Results: Recent Labs    09/30/17 2042 10/01/17 0854  WBC 11.4* 10.9*  HGB 9.9* 10.5*  HCT 31.7* 33.8*  PLT 268 277   BMET:  Recent Labs    09/30/17 1506 09/30/17 2042 10/01/17 0216  NA 135  --  135  K 4.2  --  3.7  CL 103  --  99*  CO2 23  --  26  GLUCOSE 97  --  109*  BUN 21*  --  25*  CREATININE 1.44* 1.67* 1.51*  CALCIUM 8.8*  --  8.9    CBG (last 3)  No results for input(s): GLUCAP in the last 72 hours. PT/INR:   Recent Labs    10/01/17 0216  LABPROT 15.2  INR 1.21    CXR:  CHEST - 2 VIEW  COMPARISON:  Fourteen 19  FINDINGS: Midline trachea. Mild cardiomegaly. Mediastinal contours otherwise within normal limits. Moderate right pleural effusion, including tracking in the right minor fissure, similar. Similar small left pleural effusion. No pneumothorax. Mild interstitial edema is increased. Right greater than  left base airspace disease is not significantly changed.  IMPRESSION: Cardiomegaly with development of mild interstitial edema.  Right greater than left pleural effusions and basilar predominant airspace disease are not significantly changed.   Electronically Signed   By: Jeronimo Greaves M.D.   On: 09/30/2017 09:05   RIGHT HEART CATH  Conclusion   1. Mildly elevated PCWP with prominent v-waves.  2. Primarily pulmonary venous hypertension.  3. Elevated RA pressure.  4. Relatively preserved cardiac output.   Procedural Details/Technique   Technical Details Procedure: Right Heart Cath  Indication: CHF, mitral regurgitation   Procedural Details: The right brachial area was prepped, draped, and anesthetized with 1% lidocaine. There was a pre-existing peripheral IV in the right brachial area that was replaced with a 63F venous sheath. A Swan-Ganz catheter was used for the right heart catheterization. Standard protocol was followed for recording of right heart pressures and sampling of oxygen saturations. Fick cardiac output was calculated. There were no immediate procedural complications. The patient was transferred to the post catheterization recovery area for further monitoring.    Estimated blood loss <50 mL.  During this procedure no sedation was administered.  Right Heart   Right Heart Pressures RHC Procedural Findings:  Hemodynamics (mmHg) RA mean 11 RV 47/15 PA 49/25, mean 38 PCWP mean 19, v-waves to 35  Oxygen saturations: PA 52% AO 94%  Cardiac Output (Fick) 4.35  Cardiac Index (Fick) 2.35  PVR 4.4 WU     Assessment/Plan: S/P Procedure(s) (LRB): RIGHT HEART CATH (N/A)  Help from Dr Shirlee Latch and advanced heart failure team appreciated.  Results of cath noted and relatively encouraging.  We tentatively plan minimally invasive mitral valve repair on Wednesday 5/8  I spent in excess of 15 minutes during the conduct of this hospital encounter and >50% of this  time involved direct face-to-face encounter with the patient for counseling and/or coordination of their care.   Purcell Nails, MD 10/01/2017 1:34 PM

## 2017-10-02 ENCOUNTER — Other Ambulatory Visit (HOSPITAL_COMMUNITY): Payer: Self-pay

## 2017-10-02 ENCOUNTER — Inpatient Hospital Stay (HOSPITAL_COMMUNITY): Payer: Medicaid Other

## 2017-10-02 DIAGNOSIS — Z01810 Encounter for preprocedural cardiovascular examination: Secondary | ICD-10-CM

## 2017-10-02 LAB — CBC WITH DIFFERENTIAL/PLATELET
BASOS ABS: 0.1 10*3/uL (ref 0.0–0.1)
Basophils Relative: 0 %
EOS ABS: 0.5 10*3/uL (ref 0.0–0.7)
EOS PCT: 4 %
HCT: 34.8 % — ABNORMAL LOW (ref 39.0–52.0)
HEMOGLOBIN: 10.8 g/dL — AB (ref 13.0–17.0)
LYMPHS ABS: 2.6 10*3/uL (ref 0.7–4.0)
LYMPHS PCT: 19 %
MCH: 26 pg (ref 26.0–34.0)
MCHC: 31 g/dL (ref 30.0–36.0)
MCV: 83.7 fL (ref 78.0–100.0)
Monocytes Absolute: 1.1 10*3/uL — ABNORMAL HIGH (ref 0.1–1.0)
Monocytes Relative: 8 %
Neutro Abs: 9.5 10*3/uL — ABNORMAL HIGH (ref 1.7–7.7)
Neutrophils Relative %: 69 %
PLATELETS: 302 10*3/uL (ref 150–400)
RBC: 4.16 MIL/uL — AB (ref 4.22–5.81)
RDW: 16.4 % — ABNORMAL HIGH (ref 11.5–15.5)
WBC: 13.8 10*3/uL — AB (ref 4.0–10.5)

## 2017-10-02 LAB — BASIC METABOLIC PANEL
Anion gap: 12 (ref 5–15)
BUN: 25 mg/dL — ABNORMAL HIGH (ref 6–20)
CHLORIDE: 97 mmol/L — AB (ref 101–111)
CO2: 26 mmol/L (ref 22–32)
CREATININE: 1.52 mg/dL — AB (ref 0.61–1.24)
Calcium: 8.7 mg/dL — ABNORMAL LOW (ref 8.9–10.3)
GFR calc Af Amer: 59 mL/min — ABNORMAL LOW (ref >60)
GFR, EST NON AFRICAN AMERICAN: 51 mL/min — AB (ref >60)
Glucose, Bld: 115 mg/dL — ABNORMAL HIGH (ref 65–99)
POTASSIUM: 3.6 mmol/L (ref 3.5–5.1)
SODIUM: 135 mmol/L (ref 135–145)

## 2017-10-02 MED ORDER — ENOXAPARIN SODIUM 40 MG/0.4ML ~~LOC~~ SOLN: 40.0000 mg | SUBCUTANEOUS | Status: DC

## 2017-10-02 MED ORDER — POTASSIUM CHLORIDE CRYS ER 20 MEQ PO TBCR
60.0000 meq | EXTENDED_RELEASE_TABLET | Freq: Once | ORAL | Status: AC
  Administered 2017-10-02: 60 meq via ORAL
  Filled 2017-10-02: qty 3

## 2017-10-02 MED FILL — Heparin Sod (Porcine)-NaCl IV Soln 1000 Unit/500ML-0.9%: INTRAVENOUS | Qty: 1000 | Status: AC

## 2017-10-02 NOTE — Evaluation (Signed)
Physical Therapy Evaluation Patient Details Name: Marcus [name] MRN: [id number] DOB: 1966-01-28 Today's Date: 10/02/2017   History of Present Illness  Marcus [name] is a 52 y.o. 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 Admitted forA/C diastolic HFwith elevated BNP and AKI. R heart cath 10/01/17    Clinical Impression  Pt admitted with above diagnosis. Pt currently with functional limitations due to the deficits listed below (see PT Problem List). PTA, pt living with mother, short distance household ambulation with RW. Pt currently presents with LLE residual weakness, deconditioning, and imbalance. Extremely motivated to regain independence and eager to work with therapies today. Ambulated 50' with RW, 3 seated rest break and min A for balance and stability.  Desat on RA, See vitals below.  Pt will benefit from skilled PT to increase their independence and safety with mobility to allow discharge to the venue listed below.    Vitals:  -Seated Rest: BP 139/109, SpO2 93% on RA HR 111 -Activity (40' of walking) SpO2 86% on RA, 92-93% on 2L, HR 112 -End of Session supine, BP: 99/77, SpO2 92% on RA, HR 109     Follow Up Recommendations CIR;Supervision/Assistance - 24 hour    Equipment Recommendations  None recommended by PT    Recommendations for Other Services Rehab consult     Precautions / Restrictions Precautions Precautions: Fall Restrictions Weight Bearing Restrictions: No      Mobility  Bed Mobility               General bed mobility comments: standing with RN upon arrival  Transfers Overall transfer level: Needs assistance Equipment used: Rolling walker (2 wheeled);2 person hand held assist Transfers: Sit to/from Stand;Stand Pivot Transfers Sit to Stand: +2 physical  assistance;+2 safety/equipment;Min assist Stand pivot transfers: Mod assist;+2 physical assistance;+2 safety/equipment       General transfer comment: use of gait belt, assist for power up. Pt more successful when therapist stabilized RW, rather than pushing up from seated surface  Ambulation/Gait Ambulation/Gait assistance: Min assist;Min guard Ambulation Distance (Feet): 50 Feet Assistive device: Rolling walker (2 wheeled) Gait Pattern/deviations: Step-to pattern;Narrow base of support Gait velocity: decreased   General Gait Details: Patient with gait deficits due to LLE sided weakness, cues for widening base of support to add stability. step to gait with RW and intermittent min A to provide stability. seated rest breaks required every 15-20'.  Desat to 87% on RA, remained >92% on 2L during session.   Stairs            Wheelchair Mobility    Modified Rankin (Stroke Patients Only)       Balance Overall balance assessment: Needs assistance Sitting-balance support: No upper extremity supported;Feet supported Sitting balance-Leahy Scale: Good     Standing balance support: Bilateral upper extremity supported Standing balance-Leahy Scale: Poor Standing balance comment: reliant on external support                              Pertinent Vitals/Pain Pain Assessment: Faces Faces Pain Scale: Hurts even more Pain Location: LLE with weight bearing Pain Descriptors / Indicators: Discomfort;Grimacing Pain Intervention(s): Repositioned;Monitored during session    Home Living Family/patient expects to be discharged to:: Private residence Living Arrangements: Parent(mother and step father) Available Help at Discharge: Family;Available 24 hours/day Type  of Home: House Home Access: Ramped entrance     Home Layout: Two level;Able to live on main level with bedroom/bathroom Home Equipment: Shower seat;Bedside commode;Walker - 2 wheels;Walker - 4 wheels;Wheelchair -  manual      Prior Function Level of Independence: Needs assistance   Gait / Transfers Assistance Needed: w/c for household ambulation, can transfer with RW  standing. hasnt walked since stroke in january, reports beginging to walk short distances in RW 10-20 feet.   ADL's / Homemaking Assistance Needed: assist for bathing, increased time required for dressing, assist for peri care        Hand Dominance   Dominant Hand: Right    Extremity/Trunk Assessment   Upper Extremity Assessment Upper Extremity Assessment: Defer to OT evaluation LUE Deficits / Details: LUE generally weaker than R; grossly 4/5    Lower Extremity Assessment Lower Extremity Assessment: (RLE 4/5, LLE 3-/5)       Communication   Communication: No difficulties  Cognition Arousal/Alertness: Awake/alert Behavior During Therapy: WFL for tasks assessed/performed Overall Cognitive Status: Within Functional Limits for tasks assessed                                        General Comments      Exercises     Assessment/Plan    PT Assessment Patient needs continued PT services  PT Problem List Decreased strength;Decreased range of motion;Decreased activity tolerance;Decreased balance;Decreased mobility;Other (comment)       PT Treatment Interventions DME instruction;Gait training;Stair training;Functional mobility training;Therapeutic exercise;Therapeutic activities;Balance training    PT Goals (Current goals can be found in the Care Plan section)  Acute Rehab PT Goals Patient Stated Goal: to get as independent as possible and work as hard as possible PT Goal Formulation: With patient Time For Goal Achievement: 10/16/17 Potential to Achieve Goals: Good    Frequency Min 3X/week   Barriers to discharge        Co-evaluation PT/OT/SLP Co-Evaluation/Treatment: Yes Reason for Co-Treatment: Complexity of the patient's impairments (multi-system involvement);For patient/therapist  safety;To address functional/ADL transfers PT goals addressed during session: Mobility/safety with mobility;Balance;Proper use of DME;Strengthening/ROM OT goals addressed during session: ADL's and self-care;Proper use of Adaptive equipment and DME;Strengthening/ROM       AM-PAC PT "6 Clicks" Daily Activity  Outcome Measure Difficulty turning over in bed (including adjusting bedclothes, sheets and blankets)?: A Little Difficulty moving from lying on back to sitting on the side of the bed? : A Little Difficulty sitting down on and standing up from a chair with arms (e.g., wheelchair, bedside commode, etc,.)?: A Little Help needed moving to and from a bed to chair (including a wheelchair)?: A Lot Help needed walking in hospital room?: A Lot Help needed climbing 3-5 steps with a railing? : Total 6 Click Score: 14    End of Session Equipment Utilized During Treatment: Gait belt;Oxygen Activity Tolerance: Patient tolerated treatment well Patient left: in bed;with call bell/phone within reach Nurse Communication: Mobility status PT Visit Diagnosis: Unsteadiness on feet (R26.81);Difficulty in walking, not elsewhere classified (R26.2);Muscle weakness (generalized) (M62.81)    Time: 1610-9604 PT Time Calculation (min) (ACUTE ONLY): 40 min   Charges:   PT Evaluation $PT Eval Moderate Complexity: 1 Mod PT Treatments $Gait Training: 8-22 mins   PT G Codes:      Etta Grandchild, PT, DPT Acute Rehab Services Pager: [phone]    Etta Grandchild 10/02/2017, 3:16  PM   

## 2017-10-02 NOTE — Evaluation (Signed)
Occupational Therapy Evaluation Patient Details Name: Marcus [name] MRN: [id number] DOB: 31-Dec-1965 Today's Date: 10/02/2017    History of Present Illness Marcus [name] is a 52 y.o. 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 Admitted forA/C diastolic HFwith elevated BNP and AKI. R heart cath 10/01/17   Clinical Impression   PTA Pt at home with parents (and some assist from brother) and was ambulating short distance with use of RW, doing his own dressing (increased time required) and getting assist for bathing and peri care from mother. Pt is currently able to perform sit to stand transfers with min A +2 assist, and SPT with mod +2 assist. Mod A for LB ADL, and min guard for UB ADL. Pt is EXTREMELY motivated, and will have 24 hour support after dc. Pt will benefit from comprehensive inpatient rehab following minimally invasive mitral valve repair on 5/8. (Pt is currently s/p new heart cath) to maximize safety and independence in ADL and functional transfers. OT will follow acutely prior to sx on 5/8. Next session to focus on BUE tasks (that include more fine motor incorporating LUE).     Follow Up Recommendations  CIR;Supervision/Assistance - 24 hour    Equipment Recommendations  Other (comment)(defer to next venue)    Recommendations for Other Services Rehab consult     Precautions / Restrictions Precautions Precautions: Fall Restrictions Weight Bearing Restrictions: No      Mobility Bed Mobility               General bed mobility comments: sitting EOB with PT when OT entered  Transfers Overall transfer level: Needs assistance Equipment used: Rolling walker (2 wheeled);2 person hand held assist Transfers: Sit to/from Stand;Stand Pivot Transfers Sit to Stand: +2 physical assistance;+2  safety/equipment;Min assist Stand pivot transfers: Mod assist;+2 physical assistance;+2 safety/equipment       General transfer comment: use of gait belt, assist for power up. Pt more successful when therapist stabilized RW, rather than pushing up from seated surface    Balance Overall balance assessment: Needs assistance Sitting-balance support: No upper extremity supported;Feet supported Sitting balance-Leahy Scale: Good     Standing balance support: Bilateral upper extremity supported Standing balance-Leahy Scale: Poor Standing balance comment: reliant on external support                            ADL either performed or assessed with clinical judgement   ADL Overall ADL's : Needs assistance/impaired Eating/Feeding: Set up   Grooming: Set up;Sitting Grooming Details (indicate cue type and reason): unable to maintain standing without BUE support; increased time required Upper Body Bathing: Minimal assistance;Sitting   Lower Body Bathing: Minimal assistance;Sitting/lateral leans Lower Body Bathing Details (indicate cue type and reason): requires assist for sit to stand Upper Body Dressing : Set up;Sitting   Lower Body Dressing: Moderate assistance;Sit to/from stand   Toilet Transfer: Minimal assistance;Ambulation;RW Toilet Transfer Details (indicate cue type and reason): VERY short distance with RW (chair follow for safety Toileting- Clothing Manipulation and Hygiene: Moderate assistance;Sit to/from stand       Functional mobility during ADLs: Minimal assistance;Rolling walker;Cueing for sequencing General ADL Comments: Pt is extremely motivated, limited by L sided weakness from previous stroke     Vision Patient Visual Report: No change from baseline  Perception     Praxis      Pertinent Vitals/Pain Pain Assessment: Faces Faces Pain Scale: Hurts even more Pain Location: LLE with weight bearing Pain Descriptors / Indicators:  Discomfort;Grimacing Pain Intervention(s): Monitored during session;Repositioned     Hand Dominance Right   Extremity/Trunk Assessment Upper Extremity Assessment Upper Extremity Assessment: LUE deficits/detail LUE Deficits / Details: LUE generally weaker than R; grossly 4/5   Lower Extremity Assessment Lower Extremity Assessment: Defer to PT evaluation       Communication Communication Communication: No difficulties   Cognition Arousal/Alertness: Awake/alert Behavior During Therapy: WFL for tasks assessed/performed Overall Cognitive Status: Within Functional Limits for tasks assessed                                     General Comments       Exercises     Shoulder Instructions      Home Living Family/patient expects to be discharged to:: Private residence Living Arrangements: Parent(step father) Available Help at Discharge: Family;Available 24 hours/day Type of Home: House Home Access: Ramped entrance     Home Layout: Two level;Able to live on main level with bedroom/bathroom     Bathroom Shower/Tub: Producer, television/film/video: Standard Bathroom Accessibility: Yes   Home Equipment: Shower seat;Bedside commode;Walker - 2 wheels;Walker - 4 wheels;Wheelchair - manual          Prior Functioning/Environment Level of Independence: Needs assistance  Gait / Transfers Assistance Needed: w/c for household ambulation, can transfer with RW  standing. hasnt walked since stroke in january, reports beginging to walk short distances in RW 10-20 feet.  ADL's / Homemaking Assistance Needed: assist for bathing, increased time required for dressing, assist for peri care            OT Problem List: Decreased activity tolerance;Impaired balance (sitting and/or standing);Decreased knowledge of use of DME or AE;Decreased knowledge of precautions;Cardiopulmonary status limiting activity;Impaired UE functional use;Pain      OT Treatment/Interventions:  Self-care/ADL training;Therapeutic exercise;Energy conservation;DME and/or AE instruction;Therapeutic activities;Patient/family education;Balance training    OT Goals(Current goals can be found in the care plan section) Acute Rehab OT Goals Patient Stated Goal: to get as independent as possible and work as hard as possible OT Goal Formulation: With patient Time For Goal Achievement: 10/16/17 Potential to Achieve Goals: Good ADL Goals Pt Will Perform Upper Body Bathing: with modified independence;with caregiver independent in assisting;with adaptive equipment;sitting Pt Will Perform Lower Body Bathing: with modified independence;with caregiver independent in assisting;with adaptive equipment;sit to/from stand Pt Will Transfer to Toilet: with modified independence;ambulating Pt Will Perform Toileting - Clothing Manipulation and hygiene: with modified independence;sit to/from stand;with adaptive equipment Additional ADL Goal #1: Pt will perform bed mobility at mod I level prior to participating in ADL activity  OT Frequency: Min 3X/week   Barriers to D/C:            Co-evaluation PT/OT/SLP Co-Evaluation/Treatment: Yes Reason for Co-Treatment: Complexity of the patient's impairments (multi-system involvement);For patient/therapist safety;To address functional/ADL transfers PT goals addressed during session: Mobility/safety with mobility;Balance;Proper use of DME OT goals addressed during session: ADL's and self-care;Proper use of Adaptive equipment and DME      AM-PAC PT "6 Clicks" Daily Activity     Outcome Measure Help from another person eating meals?: A Little Help from another person taking care of personal grooming?: A Little Help from another person toileting, which includes using toliet,  bedpan, or urinal?: A Lot Help from another person bathing (including washing, rinsing, drying)?: A Lot Help from another person to put on and taking off regular upper body clothing?: A  Little Help from another person to put on and taking off regular lower body clothing?: A Lot 6 Click Score: 15   End of Session Equipment Utilized During Treatment: Gait belt;Rolling walker;Oxygen(2L during ambulation) Nurse Communication: Mobility status  Activity Tolerance: Patient tolerated treatment well Patient left: in chair;with call bell/phone within reach  OT Visit Diagnosis: Unsteadiness on feet (R26.81);Other abnormalities of gait and mobility (R26.89);Muscle weakness (generalized) (M62.81);Pain Pain - Right/Left: Left Pain - part of body: Leg                Time:  1145- 1214    Charges:  OT General Charges $OT Visit: 1 Visit OT Evaluation $OT Eval Moderate Complexity: 1 Mod G-Codes:     Sherryl Manges OTR/L [phone]  Evern Bio Iracema Lanagan 10/02/2017, 2:24 PM

## 2017-10-02 NOTE — Progress Notes (Addendum)
Patient ID: Marcus [name], male   DOB: Aug 30, 1965, 52 y.o.   MRN: [id number]     Advanced Heart Failure Rounding Note  PCP-Cardiologist: Armanda Magic, MD  HF Cardiology: Shirlee Latch  Subjective:    Tired this am. Breathing OK. States he had diarrhea 2-3 times last night.  No fever or chills.  Mildly lightheaded with standing.   Weight down another 7 lbs. Creatinine stable at 1.52. K 3.6  RHC 10/01/17 RHC Procedural Findings: Hemodynamics (mmHg) RA mean 11 RV 47/15 PA 49/25, mean 38 PCWP mean 19, v-waves to 35 Oxygen saturations: PA 52% AO 94% Cardiac Output (Fick) 4.35  Cardiac Index (Fick) 2.35  PVR 4.4 WU  Objective:   Weight Range: 141 lb 8.6 oz (64.2 kg) Body mass index is 19.74 kg/m.   Vital Signs:   Temp:  [98.4 F (36.9 C)-99.1 F (37.3 C)] 98.7 F (37.1 C) (05/01 2344) Pulse Rate:  [103-114] 110 (05/02 0419) Resp:  [9-32] 20 (05/02 0419) BP: (94-105)/(71-81) 104/76 (05/02 0419) SpO2:  [92 %-97 %] 96 % (05/02 0419) Weight:  [141 lb 8.6 oz (64.2 kg)] 141 lb 8.6 oz (64.2 kg) (05/02 0419) Last BM Date: 09/30/17  Weight change: Filed Weights   09/30/17 2327 10/01/17 0606 10/02/17 0419  Weight: 155 lb 3.3 oz (70.4 kg) 148 lb (67.1 kg) 141 lb 8.6 oz (64.2 kg)    Intake/Output:   Intake/Output Summary (Last 24 hours) at 10/02/2017 0749 Last data filed at 10/02/2017 0419 Gross per 24 hour  Intake -  Output 3550 ml  Net -3550 ml      Physical Exam    General: Fatigued and ill appearing. No resp difficulty. HEENT: Normal Neck: Supple. JVP 10-11. Carotids 2+ bilat; no bruits. No thyromegaly or nodule noted. Cor: PMI nondisplaced. Regular, slightly tach, 3/6 HSM apex. Lungs: CTAB, normal effort. Abdomen: Soft, non-tender, non-distended, no HSM. No bruits or masses. +BS  Extremities: No cyanosis, clubbing, or rash. Trace to 1+ edema above ted hose  Neuro: Alert & orientedx3, cranial nerves grossly intact. moves all 4 extremities w/o difficulty. Affect pleasant    Telemetry   NSR 90-100s, personally reviewed.   Labs    CBC Recent Labs    09/30/17 1506  10/01/17 0854 10/02/17 0247  WBC 11.4*   < > 10.9* 13.8*  NEUTROABS 8.3*  --   --  9.5*  HGB 9.9*   < > 10.5* 10.8*  HCT 31.7*   < > 33.8* 34.8*  MCV 84.5   < > 83.7 83.7  PLT 248   < > 277 302   < > = values in this interval not displayed.   Basic Metabolic Panel Recent Labs    40/98/11 0216 10/02/17 0247  NA 135 135  K 3.7 3.6  CL 99* 97*  CO2 26 26  GLUCOSE 109* 115*  BUN 25* 25*  CREATININE 1.51* 1.52*  CALCIUM 8.9 8.7*   Liver Function Tests Recent Labs    09/30/17 1506  AST 38  ALT 33  ALKPHOS 106  BILITOT 0.6  PROT 7.5  ALBUMIN 2.8*   No results for input(s): LIPASE, AMYLASE in the last 72 hours. Cardiac Enzymes Recent Labs    09/30/17 1506  TROPONINI <0.03    BNP: BNP (last 3 results) Recent Labs    08/09/17 1607 09/30/17 1500  BNP 449.0* 706.9*    ProBNP (last 3 results) Recent Labs    09/29/17 1350  PROBNP 13,809*     D-Dimer No results for  input(s): DDIMER in the last 72 hours. Hemoglobin A1C No results for input(s): HGBA1C in the last 72 hours. Fasting Lipid Panel No results for input(s): CHOL, HDL, LDLCALC, TRIG, CHOLHDL, LDLDIRECT in the last 72 hours. Thyroid Function Tests Recent Labs    09/29/17 1350  TSH 3.110    Other results:   Imaging    No results found.   Medications:     Scheduled Medications: . aspirin EC  81 mg Oral Daily  . atorvastatin  20 mg Oral q1800  . enoxaparin (LOVENOX) injection  30 mg Subcutaneous Q24H  . furosemide  80 mg Intravenous BID  . gabapentin  300 mg Oral TID  . sodium chloride flush  3 mL Intravenous Q12H  . sodium chloride flush  3 mL Intravenous Q12H  . thiamine  100 mg Oral Daily    Infusions: . sodium chloride    . sodium chloride      PRN Medications: sodium chloride, sodium chloride, acetaminophen, ondansetron (ZOFRAN) IV, oxyCODONE, sodium chloride flush,  sodium chloride flush   Patient Profile   Marcus [name] is a 52 y.o. 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   Assessment/Plan   1. Acute diastolic CHF: Due to severe mitral regurgitation from perforated anterior leaflet.  He was admitted markedly volume overloaded on exam.  Symptoms were baseline, NYHA class III, likely due to a combination of deconditioning and CHF.  Echo showed that LV EF remains normal at 55-60%.  However, RV has mild to moderate systolic dysfunction.  - RHC 10/01/17 mildly elevated PCWP and pulmonary venous hypertension. Relatively preserved CO.  - Volume status remains at least mildly elevated - Continue lasix 80 mg IV BID for at least today, probably to po tomorrow. Supp K.  2. Severe mitral regurgitation: Due to prior endocarditis with MSSA.  - Echo and TEE, he has a large anterior leaflet perforation and probably a chordal rupture.  - Diuresis to treat CHF.  - Soft BP, would hold off on afterload reduction for the time being.  - Once he is well-diuresed, he will need MV repair, this is planned for 5/8.  He had coronary angiography during a prior hospitalization, no obstructive CAD.  - No change to current plan.   3. 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  He has had a full course of antibiotic treatment and PICC is now out.  - Afebrile. WBC 13.8.  - Blood cultures NGTD.  4. H/o septic arthritis: Left knee.   - This significantly limits his mobility.  - PT/OT consults.  - No change to current plan.   5. Right pleural effusion:  - Moderate on CXR this admission.  Chronic, likely due to CHF.  - Continue diuresis.  6. Polysubstance  abuse:  - Prior use of opioids. Encouraged complete cessation.  7. CKD: Stage 3 - Follow with diuresis.  8. Diarrhea - Follow. 2-3 episodes last night.   Length of Stay: 2  Luane School  10/02/2017, 7:49 AM  Advanced Heart Failure Team Pager [phone] (M-F; 7a - 4p)  Please contact [facility] for night-coverage after hours (4p -7a ) and weekends on [email or web address]  Patient seen with PA, agree with the above note.  Weight coming down nicely with diuresis.  He still has at least mild volume overload on exam.  Would continue for 1 more day with IV diuresis then to po.   Plan for minimally invasive mitral valve repair on 5/8.    Needs to continue to work with PT/OT.  Will also ask cardiac rehab to see him to get maximal mobilization.   Marca Ancona 10/02/2017 8:03 AM

## 2017-10-02 NOTE — Care Management Note (Signed)
Case Management Note  Patient Details  Name: BABY STAIRS MRN: [id number] Date of Birth: 24-Dec-1965  Subjective/Objective:  Pt admitted with HF, AKI and elevated BNP                  Action/Plan:  PTA from home with parent.  Pt previously admitted for endocarditis - plan is for heart cath today.  Pt is active with [facility] and has received information for orange card application from clinic.  Pt may require MATCH letter at discharge if he hasn't already used benefit this calendar year.  CM will continue to follow   Expected Discharge Date:                  Expected Discharge Plan:     In-House Referral:  Clinical Social Work  Discharge planning Services  CM Consult  Post Acute Care Choice:    Choice offered to:     DME Arranged:    DME Agency:     HH Arranged:    HH Agency:     Status of Service:     If discussed at [employer], dates discussed:    Additional Comments: 10/02/2017 Plan is for pt to have MVR next week - pt continues to have intensity requiring continued stay. Cherylann Parr, RN 10/02/2017, 3:11 PM

## 2017-10-02 NOTE — Progress Notes (Signed)
Inpatient Rehabilitation  Per PT and OT request, patient was screened by Mychal Decarlo for appropriateness for an Inpatient Acute Rehab consult.  At this time we are recommending an Inpatient Rehab consult.  Please order if you are agreeable.    Brieanne Mignone, M.A., CCC/SLP Admission Coordinator  [facility]  Cell [phone]  

## 2017-10-02 NOTE — Progress Notes (Signed)
Pre-op Cardiac Surgery  Carotid Findings:  1-39% ICA stenosis.   Upper Extremity Right Left  Brachial Pressures IV T 92T  Radial Waveforms T T  Ulnar Waveforms T T  Palmar Arch (Allen's Test) WNL Doppler signal obliterates with both radial and ulnar compression.     Findings:

## 2017-10-03 ENCOUNTER — Other Ambulatory Visit: Payer: Self-pay

## 2017-10-03 DIAGNOSIS — I4892 Unspecified atrial flutter: Secondary | ICD-10-CM

## 2017-10-03 LAB — CBC WITH DIFFERENTIAL/PLATELET
BASOS ABS: 0.1 10*3/uL (ref 0.0–0.1)
Basophils Relative: 1 %
Eosinophils Absolute: 0.8 10*3/uL — ABNORMAL HIGH (ref 0.0–0.7)
Eosinophils Relative: 6 %
HCT: 34.3 % — ABNORMAL LOW (ref 39.0–52.0)
HEMOGLOBIN: 10.3 g/dL — AB (ref 13.0–17.0)
LYMPHS ABS: 2.4 10*3/uL (ref 0.7–4.0)
LYMPHS PCT: 19 %
MCH: 25.3 pg — AB (ref 26.0–34.0)
MCHC: 30 g/dL (ref 30.0–36.0)
MCV: 84.3 fL (ref 78.0–100.0)
Monocytes Absolute: 1 10*3/uL (ref 0.1–1.0)
Monocytes Relative: 8 %
NEUTROS PCT: 66 %
Neutro Abs: 8.5 10*3/uL — ABNORMAL HIGH (ref 1.7–7.7)
PLATELETS: 296 10*3/uL (ref 150–400)
RBC: 4.07 MIL/uL — AB (ref 4.22–5.81)
RDW: 16.2 % — ABNORMAL HIGH (ref 11.5–15.5)
WBC: 12.8 10*3/uL — AB (ref 4.0–10.5)

## 2017-10-03 LAB — BASIC METABOLIC PANEL
Anion gap: 10 (ref 5–15)
BUN: 31 mg/dL — ABNORMAL HIGH (ref 6–20)
CHLORIDE: 96 mmol/L — AB (ref 101–111)
CO2: 28 mmol/L (ref 22–32)
Calcium: 8.8 mg/dL — ABNORMAL LOW (ref 8.9–10.3)
Creatinine, Ser: 1.6 mg/dL — ABNORMAL HIGH (ref 0.61–1.24)
GFR calc Af Amer: 56 mL/min — ABNORMAL LOW (ref >60)
GFR, EST NON AFRICAN AMERICAN: 48 mL/min — AB (ref >60)
Glucose, Bld: 112 mg/dL — ABNORMAL HIGH (ref 65–99)
Potassium: 4.2 mmol/L (ref 3.5–5.1)
SODIUM: 134 mmol/L — AB (ref 135–145)

## 2017-10-03 LAB — ACID FAST CULTURE WITH REFLEXED SENSITIVITIES (MYCOBACTERIA)

## 2017-10-03 LAB — ACID FAST CULTURE WITH REFLEXED SENSITIVITIES: ACID FAST CULTURE - AFSCU3: NEGATIVE

## 2017-10-03 LAB — HEPARIN LEVEL (UNFRACTIONATED): Heparin Unfractionated: <0.1 IU/mL — ABNORMAL LOW (ref 0.30–0.70)

## 2017-10-03 LAB — MAGNESIUM: Magnesium: 2 mg/dL (ref 1.7–2.4)

## 2017-10-03 MED ORDER — OXYCODONE HCL 5 MG PO TABS
5.0000 mg | ORAL_TABLET | Freq: Once | ORAL | Status: DC
  Filled 2017-10-03: qty 1

## 2017-10-03 MED ORDER — HEPARIN BOLUS VIA INFUSION
2000.0000 [IU] | Freq: Once | INTRAVENOUS | Status: AC
  Administered 2017-10-03: 2000 [IU] via INTRAVENOUS
  Filled 2017-10-03: qty 2000

## 2017-10-03 MED ORDER — AMIODARONE LOAD VIA INFUSION
150.0000 mg | Freq: Once | INTRAVENOUS | Status: AC
  Administered 2017-10-03: 150 mg via INTRAVENOUS
  Filled 2017-10-03: qty 83.34

## 2017-10-03 MED ORDER — FUROSEMIDE 40 MG PO TABS
40.0000 mg | ORAL_TABLET | Freq: Two times a day (BID) | ORAL | Status: DC
  Administered 2017-10-03 – 2017-10-09 (×12): 40 mg via ORAL
  Filled 2017-10-03 (×12): qty 1

## 2017-10-03 MED ORDER — AMIODARONE HCL IN DEXTROSE 360-4.14 MG/200ML-% IV SOLN
60.0000 mg/h | INTRAVENOUS | Status: AC
  Administered 2017-10-03 (×2): 60 mg/h via INTRAVENOUS
  Filled 2017-10-03 (×2): qty 200

## 2017-10-03 MED ORDER — AMIODARONE HCL IN DEXTROSE 360-4.14 MG/200ML-% IV SOLN
30.0000 mg/h | INTRAVENOUS | Status: DC
  Administered 2017-10-03 – 2017-10-07 (×9): 30 mg/h via INTRAVENOUS
  Filled 2017-10-03 (×10): qty 200

## 2017-10-03 MED ORDER — FUROSEMIDE 10 MG/ML IJ SOLN
INTRAMUSCULAR | Status: AC
  Filled 2017-10-03: qty 8

## 2017-10-03 MED ORDER — HEPARIN (PORCINE) IN NACL 100-0.45 UNIT/ML-% IJ SOLN
1350.0000 [IU]/h | INTRAMUSCULAR | Status: DC
  Administered 2017-10-03: 950 [IU]/h via INTRAVENOUS
  Administered 2017-10-04: 1350 [IU]/h via INTRAVENOUS
  Filled 2017-10-03 (×2): qty 250

## 2017-10-03 NOTE — Progress Notes (Addendum)
MD notified of sudden increase in HR 130-140s.  12 Lead EKG obtained for MD to review.  Advised to defer to HF team in the morning.  Patient asymptomatic. Will continue to monitor.

## 2017-10-03 NOTE — Progress Notes (Addendum)
Patient ID: Marcus [name], male   DOB: 1965/11/23, 52 y.o.   MRN: [id number]     Advanced Heart Failure Rounding Note  PCP-Cardiologist: Armanda Magic, MD  HF Cardiology: Shirlee Latch  Subjective:    HR up into 130-140s. Appears to A-flutter.   Feeling OK this am. Walked halls without lightheadedness or dizziness. Denies SOB. Remains weak and tired. Diarrhea improved.  I/O negative but weight up 2 lbs. Cr 1.6. K 4.2. Mg pending.   RHC 10/01/17 RHC Procedural Findings: Hemodynamics (mmHg) RA mean 11 RV 47/15 PA 49/25, mean 38 PCWP mean 19, v-waves to 35 Oxygen saturations: PA 52% AO 94% Cardiac Output (Fick) 4.35  Cardiac Index (Fick) 2.35  PVR 4.4 WU  Objective:   Weight Range: 143 lb 14.4 oz (65.3 kg) Body mass index is 20.07 kg/m.   Vital Signs:   Temp:  [97.9 F (36.6 C)-98.3 F (36.8 C)] 97.9 F (36.6 C) (05/03 0450) Pulse Rate:  [101-110] 102 (05/03 0342) Resp:  [12-23] 16 (05/03 0342) BP: (82-101)/(64-77) 86/65 (05/03 0342) SpO2:  [89 %-96 %] 96 % (05/03 0342) Weight:  [143 lb 14.4 oz (65.3 kg)] 143 lb 14.4 oz (65.3 kg) (05/03 0450) Last BM Date: 10/01/17  Weight change: Filed Weights   10/01/17 0606 10/02/17 0419 10/03/17 0450  Weight: 148 lb (67.1 kg) 141 lb 8.6 oz (64.2 kg) 143 lb 14.4 oz (65.3 kg)    Intake/Output:   Intake/Output Summary (Last 24 hours) at 10/03/2017 0738 Last data filed at 10/03/2017 0229 Gross per 24 hour  Intake 1130 ml  Output 1800 ml  Net -670 ml      Physical Exam    General: Fatigued and ill appearing. No resp difficulty. HEENT: Normal Neck: Supple. JVP 9-10 cm. Carotids 2+ bilat; no bruits. No thyromegaly or nodule noted. Cor: PMI nondisplaced. RRR, tachy. 4/6 HSM heard throughout. Audible in back.  Lungs: CTAB, normal effort. Abdomen: Soft, non-tender, non-distended, no HSM. No bruits or masses. +BS  Extremities: No cyanosis, clubbing, or rash. Trace to 1+ edema above ted hose.   Neuro: Alert & orientedx3, cranial nerves  grossly intact. moves all 4 extremities w/o difficulty. Affect pleasant   Telemetry   Appears to be in Aflutter 130-140s since ~ 0500, personally reviewed.  EKG   "Sinus tach" 137 bpm, personally reviewed. ? Aflutter.  Labs    CBC Recent Labs    10/02/17 0247 10/03/17 0333  WBC 13.8* 12.8*  NEUTROABS 9.5* 8.5*  HGB 10.8* 10.3*  HCT 34.8* 34.3*  MCV 83.7 84.3  PLT 302 296   Basic Metabolic Panel Recent Labs    41/32/44 0247 10/03/17 0333  NA 135 134*  K 3.6 4.2  CL 97* 96*  CO2 26 28  GLUCOSE 115* 112*  BUN 25* 31*  CREATININE 1.52* 1.60*  CALCIUM 8.7* 8.8*   Liver Function Tests Recent Labs    09/30/17 1506  AST 38  ALT 33  ALKPHOS 106  BILITOT 0.6  PROT 7.5  ALBUMIN 2.8*   No results for input(s): LIPASE, AMYLASE in the last 72 hours. Cardiac Enzymes Recent Labs    09/30/17 1506  TROPONINI <0.03    BNP: BNP (last 3 results) Recent Labs    08/09/17 1607 09/30/17 1500  BNP 449.0* 706.9*    ProBNP (last 3 results) Recent Labs    09/29/17 1350  PROBNP 13,809*     D-Dimer No results for input(s): DDIMER in the last 72 hours. Hemoglobin A1C No results for input(s): HGBA1C  in the last 72 hours. Fasting Lipid Panel No results for input(s): CHOL, HDL, LDLCALC, TRIG, CHOLHDL, LDLDIRECT in the last 72 hours. Thyroid Function Tests No results for input(s): TSH, T4TOTAL, T3FREE, THYROIDAB in the last 72 hours.  Invalid input(s): FREET3  Other results:   Imaging    No results found.   Medications:     Scheduled Medications: . aspirin EC  81 mg Oral Daily  . atorvastatin  20 mg Oral q1800  . enoxaparin (LOVENOX) injection  40 mg Subcutaneous Q24H  . furosemide  80 mg Intravenous BID  . gabapentin  300 mg Oral TID  . oxyCODONE  5 mg Oral Once  . sodium chloride flush  3 mL Intravenous Q12H  . sodium chloride flush  3 mL Intravenous Q12H  . thiamine  100 mg Oral Daily    Infusions: . sodium chloride    . sodium chloride        PRN Medications: sodium chloride, sodium chloride, acetaminophen, ondansetron (ZOFRAN) IV, oxyCODONE, sodium chloride flush, sodium chloride flush   Patient Profile   Marcus [name] is a 52 y.o. 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   Assessment/Plan   1. Acute diastolic CHF: Due to severe mitral regurgitation from perforated anterior leaflet.  He was admitted markedly volume overloaded on exam.  Symptoms were baseline, NYHA class III, likely due to a combination of deconditioning and CHF.  Echo showed that LV EF remains normal at 55-60%.  However, RV has mild to moderate systolic dysfunction.  - RHC 10/01/17 mildly elevated PCWP and pulmonary venous hypertension. Relatively preserved CO.  - Volume status remains mildly elevated with AFL this am.  - Will give at least one more dose 80 mg IV lasix. Creatinine stable.  2. Severe mitral regurgitation: Due to prior endocarditis with MSSA.  - Echo and TEE, he has a large anterior leaflet perforation and probably a chordal rupture.  - Diuresis to treat CHF.  - Soft BP. No room for afterload reduction yet.  - Once he is well-diuresed, he will need MV repair, this is planned for 5/8.  He had coronary angiography during a prior hospitalization, no obstructive CAD.  - No change to current plan.   3. ? Aflutter - Rate up into 130-140s this am.  - Will start on IV amiodarone with bolus.  - LA size 38 mm.  4. 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  He has had a full course of antibiotic treatment and PICC is now out.  - Afebrile. WBC 12.8.  - Blood cultures NGTD..  5. H/o septic arthritis: Left knee.   - This  significantly limits his mobility.  - PT/OT consults.  - No change to current plan.   6. Right pleural effusion:  - Moderate on CXR this admission.  Chronic, likely due to CHF.  - Diuresis as above.  7. Polysubstance abuse:  - Prior use of opioids. Encouraged complete cessation.  8. CKD: Stage 3 - Stable. Follow with diuresis.  9. Diarrhea - Improved. Follow.   IV lasix this am with AFL. Will start IV amiodarone.   Length of Stay: 3  Luane School  10/03/2017, 7:38 AM  Advanced Heart Failure  Team Pager [phone] (M-F; 7a - 4p)  Please contact [facility] for night-coverage after hours (4p -7a ) and weekends on [email or web address]  Patient seen with PA, agree with the above note.  He appears to have gone into 2:1 aflutter in the 130s this morning.  He does not feel it.  On exam, still with mild volume overload (JVP 9-10) but no peripheral edema.    Will give Lasix 80 mg IV x 1 this morning then switch to 40 mg po bid.  Creatinine mildly up from 1.5 => 1.6.   For the atrial flutter, will bolus amiodarone and start gtt.  Will start heparin gtt for now as well in case DCCV required.   Surgery planned for 5/8.  Marca Ancona 10/03/2017 8:12 AM

## 2017-10-03 NOTE — Progress Notes (Signed)
ANTICOAGULATION CONSULT NOTE - Initial Consult  Pharmacy Consult for heparin Indication: atrial fibrillation  No Known Allergies  Patient Measurements: Height:  (180.3 cm) Weight: 143 lb 14.4 oz (65.3 kg) IBW/kg (Calculated) : 75.3 Heparin Dosing Weight: 68 kg  Vital Signs: Temp: 97.3 F (36.3 C) (05/03 1601) Temp Source: Oral (05/03 1601) BP: 98/77 (05/03 1601) Pulse Rate: 94 (05/03 1601)  Labs: Recent Labs    10/01/17 0216 10/01/17 0854 10/02/17 0247 10/03/17 0333 10/03/17 1556  HGB  --  10.5* 10.8* 10.3*  --   HCT  --  33.8* 34.8* 34.3*  --   PLT  --  277 302 296  --   LABPROT 15.2  --   --   --   --   INR 1.21  --   --   --   --   HEPARINUNFRC  --   --   --   --  <0.10*  CREATININE 1.51*  --  1.52* 1.60*  --     Estimated Creatinine Clearance: 49.9 mL/min (A) (by C-G formula based on SCr of 1.6 mg/dL (H)).  Assessment: 48 yoM with HFpEF developed AFlutter this morning and started on IV heparin. Pt received DVT ppx dose of enoxaparin 5/2 around 0730, CBC stable.  Initial hep lvl < 0.1 - no issues per RN  Goal of Therapy:  Heparin level 0.3-0.7 units/ml Monitor platelets by anticoagulation protocol: Yes   Plan:  -Heparin 2000 units x1 bolus -Increase Heparin gtt to 1100 units/hr -next hep lvl 0000 -daily hep lvl cbc  Isaac Bliss, PharmD, BCPS, BCCCP Clinical Pharmacist Clinical phone for 10/03/2017 from 1430 - 2300: [phone] If after 2300, please call main pharmacy at: [phone] 10/03/2017 5:07 PM

## 2017-10-03 NOTE — Plan of Care (Signed)
Continue current care plan 

## 2017-10-03 NOTE — Progress Notes (Signed)
ANTICOAGULATION CONSULT NOTE - Initial Consult  Pharmacy Consult for heparin Indication: atrial fibrillation  No Known Allergies  Patient Measurements: Height:  (180.3 cm) Weight: 143 lb 14.4 oz (65.3 kg) IBW/kg (Calculated) : 75.3 Heparin Dosing Weight: 68 kg  Vital Signs: Temp: 98.2 F (36.8 C) (05/03 0810) Temp Source: Oral (05/03 0810) BP: 85/68 (05/03 0810) Pulse Rate: 140 (05/03 0810)  Labs: Recent Labs    09/30/17 1506  10/01/17 0216 10/01/17 0854 10/02/17 0247 10/03/17 0333  HGB 9.9*   < >  --  10.5* 10.8* 10.3*  HCT 31.7*   < >  --  33.8* 34.8* 34.3*  PLT 248   < >  --  277 302 296  LABPROT  --   --  15.2  --   --   --   INR  --   --  1.21  --   --   --   CREATININE 1.44*   < > 1.51*  --  1.52* 1.60*  TROPONINI <0.03  --   --   --   --   --    < > = values in this interval not displayed.    Estimated Creatinine Clearance: 49.9 mL/min (A) (by C-G formula based on SCr of 1.6 mg/dL (H)).   Medical History: Past Medical History:  Diagnosis Date  . Acute kidney failure (HCC) 06/2017   hx/notes 06/13/2017  . Arthritis    "all my joints ache; at atll times" (06/13/2017)  . Bacteremia due to methicillin resistant Staphylococcus aureus    Hattie Perch 06/13/2017  . Chronic lower back pain   . Endocarditis of mitral valve    MSSA mitral valve endocarditis/notes 06/13/2017  . Hepatitis C    hx/notes 06/13/2017  . 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 Malnutrition of moderate degree 06/19/2017  . Meningitis due to bacteria    Hattie Perch 06/12/2017  . Pleural effusion, bilateral   . Polysubstance abuse (HCC)    hx/notes 06/13/2017  . Septic arthritis of knee, left (HCC) 06/2017   Hattie Perch 06/13/2017  . Septic embolism (HCC) 06/2017   Hattie Perch 06/12/2017  . Severe mitral regurgitation   . Thrombocytopenia (HCC)    hx/notes 06/13/2017    Assessment: 52 yoM with HFpEF developed AFlutter this morning to start on IV heparin. Pt received DVT ppx dose of  enoxaparin 5/2 around 0730, CBC stable.  Goal of Therapy:  Heparin level 0.3-0.7 units/ml Monitor platelets by anticoagulation protocol: Yes   Plan:  -Heparin 2000 units x1 -Heparin 950 units/hr -Check 6-hr heparin level  Fredonia Highland, PharmD, BCPS PGY-2 Cardiology Pharmacy Resident Pager: [phone] 10/03/2017

## 2017-10-04 LAB — BASIC METABOLIC PANEL
ANION GAP: 11 (ref 5–15)
BUN: 33 mg/dL — ABNORMAL HIGH (ref 6–20)
CO2: 24 mmol/L (ref 22–32)
Calcium: 8.6 mg/dL — ABNORMAL LOW (ref 8.9–10.3)
Chloride: 94 mmol/L — ABNORMAL LOW (ref 101–111)
Creatinine, Ser: 1.78 mg/dL — ABNORMAL HIGH (ref 0.61–1.24)
GFR, EST AFRICAN AMERICAN: 49 mL/min — AB (ref >60)
GFR, EST NON AFRICAN AMERICAN: 42 mL/min — AB (ref >60)
Glucose, Bld: 119 mg/dL — ABNORMAL HIGH (ref 65–99)
POTASSIUM: 4 mmol/L (ref 3.5–5.1)
SODIUM: 129 mmol/L — AB (ref 135–145)

## 2017-10-04 LAB — CBC WITH DIFFERENTIAL/PLATELET
BASOS PCT: 1 %
Basophils Absolute: 0.1 10*3/uL (ref 0.0–0.1)
EOS PCT: 2 %
Eosinophils Absolute: 0.2 10*3/uL (ref 0.0–0.7)
HCT: 34.8 % — ABNORMAL LOW (ref 39.0–52.0)
Hemoglobin: 10.8 g/dL — ABNORMAL LOW (ref 13.0–17.0)
LYMPHS ABS: 3.1 10*3/uL (ref 0.7–4.0)
Lymphocytes Relative: 28 %
MCH: 25.9 pg — AB (ref 26.0–34.0)
MCHC: 31 g/dL (ref 30.0–36.0)
MCV: 83.5 fL (ref 78.0–100.0)
MONOS PCT: 7 %
Monocytes Absolute: 0.8 10*3/uL (ref 0.1–1.0)
Neutro Abs: 6.7 10*3/uL (ref 1.7–7.7)
Neutrophils Relative %: 62 %
PLATELETS: 315 10*3/uL (ref 150–400)
RBC: 4.17 MIL/uL — AB (ref 4.22–5.81)
RDW: 16.3 % — ABNORMAL HIGH (ref 11.5–15.5)
WBC: 10.9 10*3/uL — AB (ref 4.0–10.5)

## 2017-10-04 LAB — HEPARIN LEVEL (UNFRACTIONATED)
HEPARIN UNFRACTIONATED: 0.22 [IU]/mL — AB (ref 0.30–0.70)
Heparin Unfractionated: 0.13 IU/mL — ABNORMAL LOW (ref 0.30–0.70)
Heparin Unfractionated: <0.1 IU/mL — ABNORMAL LOW (ref 0.30–0.70)

## 2017-10-04 LAB — MAGNESIUM: MAGNESIUM: 2.1 mg/dL (ref 1.7–2.4)

## 2017-10-04 MED ORDER — HEPARIN BOLUS VIA INFUSION
2000.0000 [IU] | Freq: Once | INTRAVENOUS | Status: AC
  Administered 2017-10-04: 2000 [IU] via INTRAVENOUS
  Filled 2017-10-04: qty 2000

## 2017-10-04 MED ORDER — HEPARIN (PORCINE) IN NACL 100-0.45 UNIT/ML-% IJ SOLN
1950.0000 [IU]/h | INTRAMUSCULAR | Status: AC
  Administered 2017-10-04: 1500 [IU]/h via INTRAVENOUS
  Administered 2017-10-06 (×2): 1800 [IU]/h via INTRAVENOUS
  Administered 2017-10-07: 1900 [IU]/h via INTRAVENOUS
  Filled 2017-10-04 (×5): qty 250

## 2017-10-04 MED ORDER — HEPARIN BOLUS VIA INFUSION
1000.0000 [IU] | Freq: Once | INTRAVENOUS | Status: AC
  Administered 2017-10-04: 1000 [IU] via INTRAVENOUS
  Filled 2017-10-04: qty 1000

## 2017-10-04 NOTE — Progress Notes (Signed)
ANTICOAGULATION CONSULT NOTE   Pharmacy Consult for heparin Indication: atrial fibrillation  No Known Allergies  Patient Measurements: Height:  (180.3 cm) Weight: 146 lb 2.6 oz (66.3 kg) IBW/kg (Calculated) : 75.3 Heparin Dosing Weight: 68 kg  Vital Signs: Temp: 98.2 F (36.8 C) (05/04 1100) Temp Source: Oral (05/04 1100) BP: 88/73 (05/04 1100) Pulse Rate: 86 (05/04 1100)  Labs: Recent Labs    10/02/17 0247 10/03/17 0333 10/03/17 1556 10/04/17 0017 10/04/17 0252 10/04/17 0932  HGB 10.8* 10.3*  --   --  10.8*  --   HCT 34.8* 34.3*  --   --  34.8*  --   PLT 302 296  --   --  315  --   HEPARINUNFRC  --   --  <0.10* 0.13*  --  <0.10*  CREATININE 1.52* 1.60*  --   --  1.78*  --     Estimated Creatinine Clearance: 45.5 mL/min (A) (by C-G formula based on SCr of 1.78 mg/dL (H)).  Assessment: 25 yoM with HFpEF developed AFlutter this morning and started on IV heparin. Pt received DVT ppx dose of enoxaparin 5/2 around 0730, CBC stable.  Heparin level undetectable this AM despite several rate increases.  No overt bleeding or complications noted.  Goal of Therapy:  Heparin level 0.3-0.7 units/ml Monitor platelets by anticoagulation protocol: Yes   Plan:  -Heparin 2000 units x1 bolus -Increase Heparin gtt to 1500 units/hr -Recheck heparin level in 6 hrs. -Daily heparin level and CBC.  Thanks for allowing pharmacy to be a part of this patient's care.  Tad Moore, BCPS  Clinical Pharmacist Pager [phone]  10/04/2017 12:01 PM

## 2017-10-04 NOTE — Progress Notes (Signed)
Pt. Claimed he had diarrhea x3 yesterday and claimed that he was + of c.diff 08/20/17. Discussed with infection prevention who suggested  to monitor pt. And if there is suspicion about c.diff to notify Md. Continue to monitor.

## 2017-10-04 NOTE — Progress Notes (Signed)
ANTICOAGULATION CONSULT NOTE   Pharmacy Consult for heparin Indication: atrial fibrillation  No Known Allergies  Patient Measurements: Height:  (180.3 cm) Weight: 143 lb 14.4 oz (65.3 kg) IBW/kg (Calculated) : 75.3 Heparin Dosing Weight: 68 kg  Vital Signs: Temp: 98 F (36.7 C) (05/03 2309) Temp Source: Oral (05/03 2309) BP: 102/87 (05/03 2309) Pulse Rate: 98 (05/03 2309)  Labs: Recent Labs    10/01/17 0216  10/01/17 0854 10/02/17 0247 10/03/17 0333 10/03/17 1556 10/04/17 0017  HGB  --    < > 10.5* 10.8* 10.3*  --   --   HCT  --   --  33.8* 34.8* 34.3*  --   --   PLT  --   --  277 302 296  --   --   LABPROT 15.2  --   --   --   --   --   --   INR 1.21  --   --   --   --   --   --   HEPARINUNFRC  --   --   --   --   --  <0.10* 0.13*  CREATININE 1.51*  --   --  1.52* 1.60*  --   --    < > = values in this interval not displayed.    Estimated Creatinine Clearance: 49.9 mL/min (A) (by C-G formula based on SCr of 1.6 mg/dL (H)).  Assessment: 87 yoM with HFpEF developed AFlutter this morning and started on IV heparin. Pt received DVT ppx dose of enoxaparin 5/2 around 0730, CBC stable.  Heparin level 0.13 units/ml  Goal of Therapy:  Heparin level 0.3-0.7 units/ml Monitor platelets by anticoagulation protocol: Yes   Plan:  -Heparin 2000 units x1 bolus -Increase Heparin gtt to 1350 units/hr -next hep lvl 0800 -daily hep lvl cbc  Thanks for allowing pharmacy to be a part of this patient's care.  Talbert Cage, PharmD Clinical Pharmacist

## 2017-10-04 NOTE — Progress Notes (Signed)
Refused to go back to bed, prefers to sit in a reclining chair.

## 2017-10-04 NOTE — Progress Notes (Addendum)
ANTICOAGULATION CONSULT NOTE   Pharmacy Consult for heparin Indication: atrial fibrillation  No Known Allergies  Patient Measurements: Height:  (180.3 cm) Weight: 146 lb 2.6 oz (66.3 kg) IBW/kg (Calculated) : 75.3 Heparin Dosing Weight: 68 kg  Vital Signs: Temp: 98.1 F (36.7 C) (05/04 1955) Temp Source: Axillary (05/04 1955) BP: 109/91 (05/04 1955) Pulse Rate: 85 (05/04 1955)  Labs: Recent Labs    10/02/17 0247 10/03/17 0333  10/04/17 0017 10/04/17 0252 10/04/17 0932 10/04/17 2050  HGB 10.8* 10.3*  --   --  10.8*  --   --   HCT 34.8* 34.3*  --   --  34.8*  --   --   PLT 302 296  --   --  315  --   --   HEPARINUNFRC  --   --    < > 0.13*  --  <0.10* 0.22*  CREATININE 1.52* 1.60*  --   --  1.78*  --   --    < > = values in this interval not displayed.    Estimated Creatinine Clearance: 45.5 mL/min (A) (by C-G formula based on SCr of 1.78 mg/dL (H)).  Assessment: 25 yoM with HFpEF developed AFlutter and started on IV heparin. -heparin level up to 0.22 after heparin bolus and increase to 1000 units/hr   Goal of Therapy:  Heparin level 0.3-0.7 units/ml Monitor platelets by anticoagulation protocol: Yes   Plan: -Heparin 1000 units x1 bolus -Increase Heparin gtt to 1650 units/hr -Recheck heparin level in 8 hrs. -Daily heparin level and CBC.  Thanks for allowing pharmacy to be a part of this patient's care.  Harland German, PharmD Clinical Pharmacist 10/04/2017 10:13 PM

## 2017-10-04 NOTE — Progress Notes (Signed)
Patient ID: Marcus [name], male   DOB: 08-Aug-1965, 52 y.o.   MRN: [id number]     Advanced Heart Failure Rounding Note  PCP-Cardiologist: Armanda Magic, MD  HF Cardiology: Shirlee Latch  Subjective:    Patient went into atrial flutter with RVR on 5/3.  He was started on amiodarone gtt and heparin gtt and went back into NSR.  He is in NSR today.   He feels better this morning in NSR.  Walked in hall yesterday.  No dyspnea at rest, remains weak.   Creatinine up to 1.78, he has been transitioned to po Lasix.   RHC 10/01/17 RHC Procedural Findings: Hemodynamics (mmHg) RA mean 11 RV 47/15 PA 49/25, mean 38 PCWP mean 19, v-waves to 35 Oxygen saturations: PA 52% AO 94% Cardiac Output (Fick) 4.35  Cardiac Index (Fick) 2.35  PVR 4.4 WU  Objective:   Weight Range: 146 lb 2.6 oz (66.3 kg) Body mass index is 20.39 kg/m.   Vital Signs:   Temp:  [97.3 F (36.3 C)-98.2 F (36.8 C)] 98.1 F (36.7 C) (05/04 0324) Pulse Rate:  [88-140] 88 (05/04 0324) Resp:  [10-24] 20 (05/04 0324) BP: (73-107)/(51-87) 94/78 (05/04 0324) SpO2:  [87 %-96 %] 93 % (05/04 0324) Weight:  [146 lb 2.6 oz (66.3 kg)] 146 lb 2.6 oz (66.3 kg) (05/04 0645) Last BM Date: 10/01/17  Weight change: Filed Weights   10/02/17 0419 10/03/17 0450 10/04/17 0645  Weight: 141 lb 8.6 oz (64.2 kg) 143 lb 14.4 oz (65.3 kg) 146 lb 2.6 oz (66.3 kg)    Intake/Output:   Intake/Output Summary (Last 24 hours) at 10/04/2017 0745 Last data filed at 10/04/2017 0300 Gross per 24 hour  Intake 1440.67 ml  Output 1025 ml  Net 415.67 ml      Physical Exam    General: NAD Neck: JVP 8-9 cm, no thyromegaly or thyroid nodule.  Lungs: Clear to auscultation bilaterally with normal respiratory effort. CV: Nondisplaced PMI.  Heart regular S1/S2, no S3/S4, 4/6 HSM apex=> back.  No peripheral edema.   Abdomen: Soft, nontender, no hepatosplenomegaly, no distention.  Skin: Intact without lesions or rashes.  Neurologic: Alert and oriented x 3.    Psych: Normal affect. Extremities: No clubbing or cyanosis.  HEENT: Normal.    Telemetry   NSR 80s, personally reviewed.  Labs    CBC Recent Labs    10/03/17 0333 10/04/17 0252  WBC 12.8* 10.9*  NEUTROABS 8.5* 6.7  HGB 10.3* 10.8*  HCT 34.3* 34.8*  MCV 84.3 83.5  PLT 296 315   Basic Metabolic Panel Recent Labs    11/91/47 0333 10/04/17 0252  NA 134* 129*  K 4.2 4.0  CL 96* 94*  CO2 28 24  GLUCOSE 112* 119*  BUN 31* 33*  CREATININE 1.60* 1.78*  CALCIUM 8.8* 8.6*  MG 2.0 2.1   Liver Function Tests No results for input(s): AST, ALT, ALKPHOS, BILITOT, PROT, ALBUMIN in the last 72 hours. No results for input(s): LIPASE, AMYLASE in the last 72 hours. Cardiac Enzymes No results for input(s): CKTOTAL, CKMB, CKMBINDEX, TROPONINI in the last 72 hours.  BNP: BNP (last 3 results) Recent Labs    08/09/17 1607 09/30/17 1500  BNP 449.0* 706.9*    ProBNP (last 3 results) Recent Labs    09/29/17 1350  PROBNP 13,809*     D-Dimer No results for input(s): DDIMER in the last 72 hours. Hemoglobin A1C No results for input(s): HGBA1C in the last 72 hours. Fasting Lipid Panel No results for  input(s): CHOL, HDL, LDLCALC, TRIG, CHOLHDL, LDLDIRECT in the last 72 hours. Thyroid Function Tests No results for input(s): TSH, T4TOTAL, T3FREE, THYROIDAB in the last 72 hours.  Invalid input(s): FREET3  Other results:   Imaging    No results found.   Medications:     Scheduled Medications: . aspirin EC  81 mg Oral Daily  . atorvastatin  20 mg Oral q1800  . furosemide  40 mg Oral BID  . gabapentin  300 mg Oral TID  . oxyCODONE  5 mg Oral Once  . sodium chloride flush  3 mL Intravenous Q12H  . sodium chloride flush  3 mL Intravenous Q12H  . thiamine  100 mg Oral Daily    Infusions: . sodium chloride    . sodium chloride    . amiodarone 30 mg/hr (10/04/17 0711)  . heparin 1,350 Units/hr (10/04/17 0340)    PRN Medications: sodium chloride, sodium  chloride, acetaminophen, ondansetron (ZOFRAN) IV, oxyCODONE, sodium chloride flush, sodium chloride flush   Patient Profile   Marcus [name] is a 52 y.o. 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   Assessment/Plan   1. Acute diastolic CHF: Due to severe mitral regurgitation from perforated anterior leaflet.  He was admitted markedly volume overloaded on exam.  Symptoms were baseline, NYHA class III, likely due to a combination of deconditioning and CHF.  Echo showed that LV EF remains normal at 55-60%.  However, RV has mild to moderate systolic dysfunction. RHC 10/01/17 mildly elevated PCWP and pulmonary venous hypertension. Relatively preserved CO.  He has diuresed well, now on po Lasix with rise in creatinine.  Peripheral edema completely resolved but still mild JVD.  -  Continue Lasix 40 mg po bid.  Will not continue to aggressively diurese with improvement and rise in creatinine.  2. Severe mitral regurgitation: Due to prior endocarditis with MSSA. 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  - Diuresis to treat CHF.  - Soft BP. No room for afterload reduction yet.  - Once he is well-diuresed, he will need MV repair, this is planned for 5/8.  He had coronary angiography during a prior hospitalization, no obstructive CAD.  3. Aflutter with RVR: Back in NSR today on amiodarone gtt.   - Continue amiodarone gtt and heparin gtt pre-op.  4. 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  He has had a full course of antibiotic treatment and PICC is now out. Afebrile. WBC 10.9.  - Blood cultures NGTD.  5. H/o septic arthritis: Left knee.   This significantly limits his mobility.  - PT/OT consults.  - No change to current plan.   6. Right pleural effusion: Moderate on CXR this admission.  Chronic, likely due to CHF.  - Diuresis as above.  7. Polysubstance abuse:  Prior use of opioids. Encouraged complete cessation.  8. CKD: Stage 3. Creatinine up to 1.78 today, have transitioned to po Lasix.   Length of Stay: 4  Marca Ancona, MD  10/04/2017, 7:45 AM  Advanced Heart Failure Team Pager [phone] (M-F; 7a - 4p)  Please contact [facility] for night-coverage after hours (4p -7a ) and weekends on [email or web address]

## 2017-10-05 LAB — BASIC METABOLIC PANEL
Anion gap: 12 (ref 5–15)
BUN: 37 mg/dL — AB (ref 6–20)
CHLORIDE: 90 mmol/L — AB (ref 101–111)
CO2: 25 mmol/L (ref 22–32)
CREATININE: 1.78 mg/dL — AB (ref 0.61–1.24)
Calcium: 8.5 mg/dL — ABNORMAL LOW (ref 8.9–10.3)
GFR calc Af Amer: 49 mL/min — ABNORMAL LOW (ref >60)
GFR, EST NON AFRICAN AMERICAN: 42 mL/min — AB (ref >60)
GLUCOSE: 103 mg/dL — AB (ref 65–99)
POTASSIUM: 3.6 mmol/L (ref 3.5–5.1)
Sodium: 127 mmol/L — ABNORMAL LOW (ref 135–145)

## 2017-10-05 LAB — CBC WITH DIFFERENTIAL/PLATELET
Basophils Absolute: 0.1 10*3/uL (ref 0.0–0.1)
Basophils Relative: 0 %
Eosinophils Absolute: 0.3 10*3/uL (ref 0.0–0.7)
Eosinophils Relative: 2 %
HEMATOCRIT: 31.9 % — AB (ref 39.0–52.0)
HEMOGLOBIN: 9.8 g/dL — AB (ref 13.0–17.0)
LYMPHS PCT: 27 %
Lymphs Abs: 3.2 10*3/uL (ref 0.7–4.0)
MCH: 25.5 pg — ABNORMAL LOW (ref 26.0–34.0)
MCHC: 30.7 g/dL (ref 30.0–36.0)
MCV: 82.9 fL (ref 78.0–100.0)
MONO ABS: 1.1 10*3/uL — AB (ref 0.1–1.0)
MONOS PCT: 9 %
NEUTROS ABS: 7.2 10*3/uL (ref 1.7–7.7)
NEUTROS PCT: 62 %
Platelets: 300 10*3/uL (ref 150–400)
RBC: 3.85 MIL/uL — ABNORMAL LOW (ref 4.22–5.81)
RDW: 16.5 % — AB (ref 11.5–15.5)
WBC: 11.9 10*3/uL — ABNORMAL HIGH (ref 4.0–10.5)

## 2017-10-05 LAB — CULTURE, BLOOD (ROUTINE X 2)
CULTURE: NO GROWTH
CULTURE: NO GROWTH
SPECIAL REQUESTS: ADEQUATE

## 2017-10-05 LAB — HEPARIN LEVEL (UNFRACTIONATED)
Heparin Unfractionated: 0.37 IU/mL (ref 0.30–0.70)
Heparin Unfractionated: 0.22 IU/mL — ABNORMAL LOW (ref 0.30–0.70)
Heparin Unfractionated: 0.5 IU/mL (ref 0.30–0.70)

## 2017-10-05 MED ORDER — POTASSIUM CHLORIDE CRYS ER 20 MEQ PO TBCR
40.0000 meq | EXTENDED_RELEASE_TABLET | Freq: Every day | ORAL | Status: DC
  Administered 2017-10-05 – 2017-10-13 (×9): 40 meq via ORAL
  Filled 2017-10-05 (×9): qty 2

## 2017-10-05 NOTE — Progress Notes (Signed)
Complained of pain on iv site on left forearm, site slightly swollen. Catheter removed, ice pack applied x 20 min, elevated with pillow. Swelling subsided, iv team consulted attempted to restart iv line but unsuccessful. Awaiting IV RN to try with ultrasound.

## 2017-10-05 NOTE — Progress Notes (Signed)
ANTICOAGULATION CONSULT NOTE   Pharmacy Consult for heparin Indication: atrial fibrillation  No Known Allergies  Patient Measurements: Height:  (180.3 cm) Weight: 148 lb 9.4 oz (67.4 kg) IBW/kg (Calculated) : 75.3 Heparin Dosing Weight: 68 kg  Vital Signs: Temp: 98.8 F (37.1 C) (05/05 1945) Temp Source: Oral (05/05 1945) BP: 98/80 (05/05 1945) Pulse Rate: 84 (05/05 1945)  Labs: Recent Labs    10/03/17 0333  10/04/17 0252  10/05/17 0231 10/05/17 0612 10/05/17 1442 10/05/17 2017  HGB 10.3*  --  10.8*  --  9.8*  --   --   --   HCT 34.3*  --  34.8*  --  31.9*  --   --   --   PLT 296  --  315  --  300  --   --   --   HEPARINUNFRC  --    < >  --    < >  --  0.37 0.22* 0.50  CREATININE 1.60*  --  1.78*  --  1.78*  --   --   --    < > = values in this interval not displayed.    Estimated Creatinine Clearance: 46.3 mL/min (A) (by C-G formula based on SCr of 1.78 mg/dL (H)).  Assessment: 35 yoM with HFpEF developed AFlutter and started on IV heparin.  Heparin level at goal this AM.  Hgb with slight trend down.  Platelet count stable.  No overt bleeding or complications noted.  Hep lvl now within goal  Goal of Therapy:  Heparin level 0.3-0.7 units/ml Monitor platelets by anticoagulation protocol: Yes   Plan: -heparin gtt to 1800 units/hr -Daily heparin level and CBC.  Isaac Bliss, PharmD, BCPS, BCCCP Clinical Pharmacist Clinical phone for 10/05/2017 from 0830 - 2100: [phone] If after 2100, please call main pharmacy at: [phone] 10/05/2017 8:49 PM

## 2017-10-05 NOTE — Progress Notes (Signed)
ANTICOAGULATION CONSULT NOTE   Pharmacy Consult for heparin Indication: atrial fibrillation  No Known Allergies  Patient Measurements: Height:  (180.3 cm) Weight: 148 lb 9.4 oz (67.4 kg) IBW/kg (Calculated) : 75.3 Heparin Dosing Weight: 68 kg  Vital Signs: Temp: 97.8 F (36.6 C) (05/05 1105) Temp Source: Oral (05/05 1105) BP: 94/69 (05/05 1105) Pulse Rate: 81 (05/05 1105)  Labs: Recent Labs    10/03/17 0333  10/04/17 0252  10/04/17 2050 10/05/17 0231 10/05/17 0612 10/05/17 1442  HGB 10.3*  --  10.8*  --   --  9.8*  --   --   HCT 34.3*  --  34.8*  --   --  31.9*  --   --   PLT 296  --  315  --   --  300  --   --   HEPARINUNFRC  --    < >  --    < > 0.22*  --  0.37 0.22*  CREATININE 1.60*  --  1.78*  --   --  1.78*  --   --    < > = values in this interval not displayed.    Estimated Creatinine Clearance: 46.3 mL/min (A) (by C-G formula based on SCr of 1.78 mg/dL (H)).  Assessment: 34 yoM with HFpEF developed AFlutter and started on IV heparin.  Heparin level at goal this AM.  Hgb with slight trend down.  Platelet count stable.  No overt bleeding or complications noted.  Hep lvl now low at 0.22  Goal of Therapy:  Heparin level 0.3-0.7 units/ml Monitor platelets by anticoagulation protocol: Yes   Plan: -Increase Heparin gtt to 1800 units/hr -next lvl 2200 -Daily heparin level and CBC.  Isaac Bliss, PharmD, BCPS, BCCCP Clinical Pharmacist Clinical phone for 10/05/2017 from 0830 - 2100: [phone] If after 2100, please call main pharmacy at: [phone] 10/05/2017 3:46 PM

## 2017-10-05 NOTE — Progress Notes (Signed)
Refused to eat lunch tray but ate chinese food brought by his father. Explained that outside food  has a lot of salt . Education given regarding low salt diet but not compliant with it.

## 2017-10-05 NOTE — Progress Notes (Signed)
ANTICOAGULATION CONSULT NOTE   Pharmacy Consult for heparin Indication: atrial fibrillation  No Known Allergies  Patient Measurements: Height:  (180.3 cm) Weight: 148 lb 9.4 oz (67.4 kg) IBW/kg (Calculated) : 75.3 Heparin Dosing Weight: 68 kg  Vital Signs: Temp: 97.8 F (36.6 C) (05/05 0740) Temp Source: Oral (05/05 0740) BP: 92/77 (05/05 0740) Pulse Rate: 77 (05/05 0740)  Labs: Recent Labs    10/03/17 0333  10/04/17 0252 10/04/17 0932 10/04/17 2050 10/05/17 0231 10/05/17 0612  HGB 10.3*  --  10.8*  --   --  9.8*  --   HCT 34.3*  --  34.8*  --   --  31.9*  --   PLT 296  --  315  --   --  300  --   HEPARINUNFRC  --    < >  --  <0.10* 0.22*  --  0.37  CREATININE 1.60*  --  1.78*  --   --  1.78*  --    < > = values in this interval not displayed.    Estimated Creatinine Clearance: 46.3 mL/min (A) (by C-G formula based on SCr of 1.78 mg/dL (H)).  Assessment: 32 yoM with HFpEF developed AFlutter and started on IV heparin.  Heparin level at goal this AM.  Hgb with slight trend down.  Platelet count stable.  No overt bleeding or complications noted.  Goal of Therapy:  Heparin level 0.3-0.7 units/ml Monitor platelets by anticoagulation protocol: Yes   Plan: -Continue Heparin gtt at 1650 units/hr -Recheck heparin level in 8 hrs. -Daily heparin level and CBC.  Thanks for allowing pharmacy to be a part of this patient's care.  Tad Moore, BCPS  Clinical Pharmacist Pager [phone]  10/05/2017 8:07 AM

## 2017-10-05 NOTE — Progress Notes (Signed)
Patient ID: Marcus [name], male   DOB: [birth year], 52 y.o.   MRN: [id number]     Advanced Heart Failure Rounding Note  PCP-Cardiologist: Armanda Magic, MD  HF Cardiology: Shirlee Latch  Subjective:    Patient went into atrial flutter with RVR on 5/3.  He was started on amiodarone gtt and heparin gtt and went back into NSR.  He is in NSR today.   Weak, requires assist to walk but eager to try.  No significant dyspnea.  Leg swelling has resolved.  Diarrhea seems to have resolved since yesterday am.   Creatinine stable at 1.78, he has been transitioned to po Lasix.   RHC 10/01/17 RHC Procedural Findings: Hemodynamics (mmHg) RA mean 11 RV 47/15 PA 49/25, mean 38 PCWP mean 19, v-waves to 35 Oxygen saturations: PA 52% AO 94% Cardiac Output (Fick) 4.35  Cardiac Index (Fick) 2.35  PVR 4.4 WU  Objective:   Weight Range: 148 lb 9.4 oz (67.4 kg) Body mass index is 20.72 kg/m.   Vital Signs:   Temp:  [97.8 F (36.6 C)-98.4 F (36.9 C)] 97.8 F (36.6 C) (05/05 0740) Pulse Rate:  [76-88] 77 (05/05 0740) Resp:  [11-20] 15 (05/05 0740) BP: (88-109)/(68-91) 92/77 (05/05 0740) SpO2:  [92 %-100 %] 96 % (05/05 0740) Weight:  [148 lb 9.4 oz (67.4 kg)] 148 lb 9.4 oz (67.4 kg) (05/05 0641) Last BM Date: 10/04/17  Weight change: Filed Weights   10/03/17 0450 10/04/17 0645 10/05/17 0641  Weight: 143 lb 14.4 oz (65.3 kg) 146 lb 2.6 oz (66.3 kg) 148 lb 9.4 oz (67.4 kg)    Intake/Output:   Intake/Output Summary (Last 24 hours) at 10/05/2017 0757 Last data filed at 10/05/2017 0530 Gross per 24 hour  Intake 1303.93 ml  Output 750 ml  Net 553.93 ml      Physical Exam    General: NAD Neck: JVP 8 cm, no thyromegaly or thyroid nodule.  Lungs: Clear to auscultation bilaterally with normal respiratory effort. CV: Nondisplaced PMI.  Heart regular S1/S2, no S3/S4, 3/6 HSM apex=>back.  No peripheral edema.  Abdomen: Soft, nontender, no hepatosplenomegaly, no distention.  Skin: Intact without lesions  or rashes.  Neurologic: Alert and oriented x 3.  Psych: Normal affect. Extremities: No clubbing or cyanosis.  HEENT: Normal.     Telemetry   NSR 80s, personally reviewed.  Labs    CBC Recent Labs    10/04/17 0252 10/05/17 0231  WBC 10.9* 11.9*  NEUTROABS 6.7 7.2  HGB 10.8* 9.8*  HCT 34.8* 31.9*  MCV 83.5 82.9  PLT 315 300   Basic Metabolic Panel Recent Labs    82/95/62 0333 10/04/17 0252 10/05/17 0231  NA 134* 129* 127*  K 4.2 4.0 3.6  CL 96* 94* 90*  CO2 GLUCOSE 112* 119* 103*  BUN 31* 33* 37*  CREATININE 1.60* 1.78* 1.78*  CALCIUM 8.8* 8.6* 8.5*  MG 2.0 2.1  --    Liver Function Tests No results for input(s): AST, ALT, ALKPHOS, BILITOT, PROT, ALBUMIN in the last 72 hours. No results for input(s): LIPASE, AMYLASE in the last 72 hours. Cardiac Enzymes No results for input(s): CKTOTAL, CKMB, CKMBINDEX, TROPONINI in the last 72 hours.  BNP: BNP (last 3 results) Recent Labs    08/09/17 1607 09/30/17 1500  BNP 449.0* 706.9*    ProBNP (last 3 results) Recent Labs    09/29/17 1350  PROBNP 13,809*     D-Dimer No results for input(s): DDIMER in the last 72  hours. Hemoglobin A1C No results for input(s): HGBA1C in the last 72 hours. Fasting Lipid Panel No results for input(s): CHOL, HDL, LDLCALC, TRIG, CHOLHDL, LDLDIRECT in the last 72 hours. Thyroid Function Tests No results for input(s): TSH, T4TOTAL, T3FREE, THYROIDAB in the last 72 hours.  Invalid input(s): FREET3  Other results:   Imaging    No results found.   Medications:     Scheduled Medications: . aspirin EC  81 mg Oral Daily  . atorvastatin  20 mg Oral q1800  . furosemide  40 mg Oral BID  . gabapentin  300 mg Oral TID  . oxyCODONE  5 mg Oral Once  . potassium chloride  40 mEq Oral Daily  . sodium chloride flush  3 mL Intravenous Q12H  . sodium chloride flush  3 mL Intravenous Q12H  . thiamine  100 mg Oral Daily    Infusions: . sodium chloride    . sodium  chloride    . amiodarone 30 mg/hr (10/05/17 0708)  . heparin 1,650 Units/hr (10/05/17 0530)    PRN Medications: sodium chloride, sodium chloride, acetaminophen, ondansetron (ZOFRAN) IV, oxyCODONE, sodium chloride flush, sodium chloride flush   Patient Profile   Marcus [name] is a 52 y.o. 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   Assessment/Plan   1. Acute diastolic CHF: Due to severe mitral regurgitation from perforated anterior leaflet.  He was admitted markedly volume overloaded on exam.  Symptoms were baseline, NYHA class III, likely due to a combination of deconditioning and CHF.  Echo showed that LV EF remains normal at 55-60%.  However, RV has mild to moderate systolic dysfunction. RHC 10/01/17 mildly elevated PCWP and pulmonary venous hypertension. Relatively preserved CO.  He diuresed well initially, now on po Lasix with rise in creatinine.  Peripheral edema completely resolved but still mild JVD.  -  Continue Lasix 40 mg po bid.  Will not continue to aggressively diurese with improvement and rise in creatinine.  2. Severe mitral regurgitation: Due to prior endocarditis with MSSA. 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  - Diuresis to treat CHF.  - Soft BP (SBP 90s). No room for afterload reduction yet.  - Once he is well-diuresed, he will need MV repair, this is planned for 5/8.  He had coronary angiography during a prior hospitalization, no obstructive CAD.  3. Aflutter with RVR: Back in NSR today on amiodarone gtt.   - Continue amiodarone gtt and heparin gtt pre-op.  4. ID: MSSA bacteremia with MV endocarditis, CVA from septic emboli, septic arthritis, and  PNA.  He has had a full course of antibiotic treatment and PICC is now out. Afebrile. WBC 11.  - Blood cultures NGTD.  5. H/o septic arthritis: Left knee.  This significantly limits his mobility.  - PT/OT consults.  - No change to current plan.   6. Right pleural effusion: Moderate on CXR this admission.  Chronic, likely due to CHF.  - Diuresis as above.  7. Polysubstance abuse:  Prior use of opioids. Encouraged complete cessation.  8. CKD: Stage 3. Creatinine stable at 1.78, have transitioned to po Lasix.  9. Diarrhea: He had C difficile back in March.  He had loose stool yesterday  early but has not had any further overnight or this morning.  Discussed with infection control => no fever, WBCs not significantly elevated.  Will not test C diff for now but low threshold if he has more diarrhea.   Length of Stay: 5  Marca Ancona, MD  10/05/2017, 7:57 AM  Advanced Heart Failure Team Pager [phone] (M-F; 7a - 4p)  Please contact [facility] for night-coverage after hours (4p -7a ) and weekends on [email or web address]

## 2017-10-06 DIAGNOSIS — I340 Nonrheumatic mitral (valve) insufficiency: Secondary | ICD-10-CM

## 2017-10-06 LAB — BASIC METABOLIC PANEL
Anion gap: 11 (ref 5–15)
BUN: 35 mg/dL — ABNORMAL HIGH (ref 6–20)
CO2: 25 mmol/L (ref 22–32)
Calcium: 8.3 mg/dL — ABNORMAL LOW (ref 8.9–10.3)
Chloride: 91 mmol/L — ABNORMAL LOW (ref 101–111)
Creatinine, Ser: 1.65 mg/dL — ABNORMAL HIGH (ref 0.61–1.24)
GFR calc Af Amer: 54 mL/min — ABNORMAL LOW (ref >60)
GFR, EST NON AFRICAN AMERICAN: 46 mL/min — AB (ref >60)
Glucose, Bld: 122 mg/dL — ABNORMAL HIGH (ref 65–99)
POTASSIUM: 3.9 mmol/L (ref 3.5–5.1)
Sodium: 127 mmol/L — ABNORMAL LOW (ref 135–145)

## 2017-10-06 LAB — CBC WITH DIFFERENTIAL/PLATELET
BASOS ABS: 0.1 10*3/uL (ref 0.0–0.1)
BASOS PCT: 1 %
EOS PCT: 2 %
Eosinophils Absolute: 0.2 10*3/uL (ref 0.0–0.7)
HCT: 32.7 % — ABNORMAL LOW (ref 39.0–52.0)
Hemoglobin: 10.3 g/dL — ABNORMAL LOW (ref 13.0–17.0)
Lymphocytes Relative: 22 %
Lymphs Abs: 2.7 10*3/uL (ref 0.7–4.0)
MCH: 25.6 pg — ABNORMAL LOW (ref 26.0–34.0)
MCHC: 31.5 g/dL (ref 30.0–36.0)
MCV: 81.3 fL (ref 78.0–100.0)
MONO ABS: 1.1 10*3/uL — AB (ref 0.1–1.0)
MONOS PCT: 9 %
Neutro Abs: 8.5 10*3/uL — ABNORMAL HIGH (ref 1.7–7.7)
Neutrophils Relative %: 66 %
PLATELETS: 309 10*3/uL (ref 150–400)
RBC: 4.02 MIL/uL — ABNORMAL LOW (ref 4.22–5.81)
RDW: 16.3 % — ABNORMAL HIGH (ref 11.5–15.5)
WBC: 12.6 10*3/uL — ABNORMAL HIGH (ref 4.0–10.5)

## 2017-10-06 LAB — HEPARIN LEVEL (UNFRACTIONATED): Heparin Unfractionated: 0.35 IU/mL (ref 0.30–0.70)

## 2017-10-06 NOTE — Progress Notes (Signed)
Occupational Therapy Treatment Patient Details Name: Marcus [name] MRN: [id number] DOB: 1965/07/01 Today's Date: 10/06/2017    History of present illness Marcus [name] is a 52 y.o. 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 Admitted forA/C diastolic HFwith elevated BNP and AKI. R heart cath 10/01/17   OT comments  This 52 yo male admitted with above and to have surgery 10/08/2017 presents to acute OT making progress with bed mobility, but needing more A for ambulation to bathroom and not wanting to use his LUE as much due to pain from an infiltrated IV (encouraged him to use it). He will continue to benefit from acute OT with follow up OT on CIR s/p sx.   Follow Up Recommendations  CIR;Supervision/Assistance - 24 hour    Equipment Recommendations  Other (comment)(TBD next venue)    Recommendations for Other Services Rehab consult    Precautions / Restrictions Precautions Precautions: Fall Precaution Comments: has left neoprene knee brace from home Restrictions Weight Bearing Restrictions: No       Mobility Bed Mobility Overal bed mobility: Needs Assistance Bed Mobility: Supine to Sit     Supine to sit: Supervision     General bed mobility comments: HOB up and use of rail  Transfers Overall transfer level: Needs assistance Equipment used: Rolling walker (2 wheeled) Transfers: Sit to/from Stand Sit to Stand: Min assist         General transfer comment: use of gait belt, pt wants to sit>stand his way at home (this is pulling up on RW while someone else steadies it for him). He counts to 3 and then powers up    Balance Overall balance assessment: Needs assistance Sitting-balance support: No upper extremity supported;Feet supported Sitting balance-Leahy Scale: Good     Standing  balance support: Bilateral upper extremity supported Standing balance-Leahy Scale: Poor Standing balance comment: reliant on external support of RW and person                           ADL either performed or assessed with clinical judgement   ADL Overall ADL's : Needs assistance/impaired                     Lower Body Dressing: Minimal assistance Lower Body Dressing Details (indicate cue type and reason): with increased time due to weak left side, Mod A to maintain standing Toilet Transfer: Moderate assistance;Ambulation;RW Toilet Transfer Details (indicate cue type and reason): Mod A due to weakness in LLE and pt having to really depend on UB strength when he takes step with RLE Toileting- Clothing Manipulation and Hygiene: Moderate assistance;Sit to/from stand               Vision Patient Visual Report: No change from baseline            Cognition Arousal/Alertness: Awake/alert Behavior During Therapy: WFL for tasks assessed/performed Overall Cognitive Status: Within Functional Limits for tasks assessed                                                     Pertinent Vitals/ Pain       Pain Assessment: No/denies  pain Pain Location: initially said his back was sore but after ambulation to bathroom stated it was better         Frequency  Min 3X/week        Progress Toward Goals  OT Goals(current goals can now be found in the care plan section)  Progress towards OT goals: Progressing toward goals     Plan Discharge plan remains appropriate       AM-PAC PT "6 Clicks" Daily Activity     Outcome Measure   Help from another person eating meals?: None Help from another person taking care of personal grooming?: A Little Help from another person toileting, which includes using toliet, bedpan, or urinal?: A Lot Help from another person bathing (including washing, rinsing, drying)?: A Lot Help from another person to put on  and taking off regular upper body clothing?: A Little Help from another person to put on and taking off regular lower body clothing?: A Lot 6 Click Score: 16    End of Session Equipment Utilized During Treatment: Gait belt;Rolling walker(no O2 today--he stayed at 93% or better on RA)  OT Visit Diagnosis: Unsteadiness on feet (R26.81);Other abnormalities of gait and mobility (R26.89);Muscle weakness (generalized) (M62.81);Pain Pain - part of body: (back)   Activity Tolerance (pt states he feels weaker, alot of effort just to go to bathroom and back)   Patient Left in chair;with call bell/phone within reach;with chair alarm set   Nurse Communication Mobility status(NT)        Time: 4098-1191 OT Time Calculation (min): 37 min  Charges: OT General Charges $OT Visit: 1 Visit OT Treatments $Self Care/Home Management : 23-37 mins Ignacia Palma, OTR/L [phone] 10/06/2017

## 2017-10-06 NOTE — Progress Notes (Signed)
Physical Therapy Treatment Patient Details Name: Marcus [name] MRN: [id number] DOB: 26-Jan-1966 Today's Date: 10/06/2017    History of Present Illness Marcus [name] is a 52 y.o. 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 Admitted forA/C diastolic HFwith elevated BNP and AKI. R heart cath 10/01/17    PT Comments    Pt reports feeling sick for the past few days but willing to participate with PT. Session focused on transfers and ambulation for improved functional mobility and independence. Pt is hopeful that MVR scheduled for 10-08-17 will be successful and that he would like to work hard to recover as soon as possible  Follow Up Recommendations  CIR;Supervision/Assistance - 24 hour     Equipment Recommendations  None recommended by PT    Recommendations for Other Services       Precautions / Restrictions Precautions Precautions: Fall;Other (comment)(enteric) Precaution Comments: has left neoprene knee brace from home Restrictions Weight Bearing Restrictions: No    Mobility  Bed Mobility                  Transfers Overall transfer level: Needs assistance Equipment used: Rolling walker (2 wheeled) Transfers: Sit to/from Stand Sit to Stand: Min assist         General transfer comment: Pt performed 2 sit<>stand transfers from recliner chair with RW with successful attempts occurring when pt placed one UE on the arm rest and the other on the walker. PT anchored anterior aspect of RW. Pt displayed good technique with forward lean and feet tucked posteriorly  Ambulation/Gait Ambulation/Gait assistance: Min assist;+2 safety/equipment Ambulation Distance (Feet): 36 Feet(24 ft with seated rest break. Then 12 feet in hallway) Assistive device: Rolling walker (2 wheeled) Gait  Pattern/deviations: Step-to pattern;Decreased stride length;Decreased stance time - left;Decreased weight shift to left;Trunk flexed   Gait velocity interpretation: <1.31 ft/sec, indicative of household ambulator General Gait Details: Pt ambulates with increased hip flexion and trunk flexion secondary to L knee remaining in flexion throughout stance phase. Pt ambulates slowly and with increased effort. Pt avoided O2 desaturation on room air during ambulation.   Stairs             Wheelchair Mobility    Modified Rankin (Stroke Patients Only)       Balance Overall balance assessment: Needs assistance Sitting-balance support: No upper extremity supported;Feet supported Sitting balance-Leahy Scale: Good     Standing balance support: Bilateral upper extremity supported Standing balance-Leahy Scale: Poor Standing balance comment: reliant on external support of RW                            Cognition Arousal/Alertness: Awake/alert Behavior During Therapy: WFL for tasks assessed/performed Overall Cognitive Status: Within Functional Limits for tasks assessed                                        Exercises      General Comments        Pertinent Vitals/Pain Pain Assessment: 0-10 Pain Score: 8  Pain Location: Mid back and L knee Pain Descriptors / Indicators: Discomfort;Grimacing Pain Intervention(s): Limited activity within patient's tolerance    Home Living  Prior Function            PT Goals (current goals can now be found in the care plan section) Acute Rehab PT Goals Patient Stated Goal: to get as independent as possible and work as hard as possible PT Goal Formulation: With patient Time For Goal Achievement: 10/16/17 Potential to Achieve Goals: Good Progress towards PT goals: Progressing toward goals    Frequency           PT Plan Current plan remains appropriate    Co-evaluation               AM-PAC PT "6 Clicks" Daily Activity  Outcome Measure  Difficulty turning over in bed (including adjusting bedclothes, sheets and blankets)?: A Little Difficulty moving from lying on back to sitting on the side of the bed? : A Little Difficulty sitting down on and standing up from a chair with arms (e.g., wheelchair, bedside commode, etc,.)?: A Little Help needed moving to and from a bed to chair (including a wheelchair)?: A Little Help needed walking in hospital room?: A Little Help needed climbing 3-5 steps with a railing? : Total 6 Click Score: 16    End of Session Equipment Utilized During Treatment: Gait belt Activity Tolerance: Patient tolerated treatment well Patient left: in bed;with call bell/phone within reach;with chair alarm set   PT Visit Diagnosis: Unsteadiness on feet (R26.81);Difficulty in walking, not elsewhere classified (R26.2);Muscle weakness (generalized) (M62.81)     Time: 1610-9604 PT Time Calculation (min) (ACUTE ONLY): 30 min  Charges:  $Gait Training: 8-22 mins $Therapeutic Activity: 8-22 mins                    G Codes:       Gabe Bufford Helms, SPT   [employer] 10/06/2017, 1:49 PM

## 2017-10-06 NOTE — Progress Notes (Signed)
ANTICOAGULATION CONSULT NOTE   Pharmacy Consult for heparin Indication: atrial fibrillation  No Known Allergies  Patient Measurements: Height:  (180.3 cm) Weight: 140 lb 10.5 oz (63.8 kg) IBW/kg (Calculated) : 75.3 Heparin Dosing Weight: 68 kg  Vital Signs: Temp: 98.4 F (36.9 C) (05/06 0340) Temp Source: Oral (05/06 0340) BP: 92/76 (05/06 0340) Pulse Rate: 80 (05/06 0340)  Labs: Recent Labs    10/04/17 0252  10/05/17 0231  10/05/17 1442 10/05/17 2017 10/06/17 0237  HGB 10.8*  --  9.8*  --   --   --  10.3*  HCT 34.8*  --  31.9*  --   --   --  32.7*  PLT 315  --  300  --   --   --  309  HEPARINUNFRC  --    < >  --    < > 0.22* 0.50 0.35  CREATININE 1.78*  --  1.78*  --   --   --  1.65*   < > = values in this interval not displayed.    Estimated Creatinine Clearance: 47.3 mL/min (A) (by C-G formula based on SCr of 1.65 mg/dL (H)).  Assessment: 89 yoM with HFpEF developed AFlutter and started on IV heparin.  Heparin level at goal this AM at 0.35. CBC stable, no overt bleeding complications noted.   Goal of Therapy:  Heparin level 0.3-0.7 units/ml Monitor platelets by anticoagulation protocol: Yes   Plan: -Heparin at 1800 units/hr -Daily heparin level and CBC.  Fredonia Highland, PharmD, BCPS PGY-2 Cardiology Pharmacy Resident Pager: [phone] 10/06/2017

## 2017-10-06 NOTE — Progress Notes (Addendum)
Patient ID: Marcus [name], male   DOB: [birth year], 52 y.o.   MRN: [id number]     Advanced Heart Failure Rounding Note  PCP-Cardiologist: Armanda Magic, MD  HF Cardiology: Shirlee Latch  Subjective:    Patient went into atrial flutter with RVR on 5/3.  He was started on amiodarone gtt and heparin gtt and went back into NSR.    Complaining of abdominal discomfort. Had 3 loose BMs. He had C -diff in March. Denies SOB.   RHC 10/01/17 RHC Procedural Findings: Hemodynamics (mmHg) RA mean 11 RV 47/15 PA 49/25, mean 38 PCWP mean 19, v-waves to 35 Oxygen saturations: PA 52% AO 94% Cardiac Output (Fick) 4.35  Cardiac Index (Fick) 2.35  PVR 4.4 WU  Objective:   Weight Range: 140 lb 10.5 oz (63.8 kg) Body mass index is 19.62 kg/m.   Vital Signs:   Temp:  [97.8 F (36.6 C)-99 F (37.2 C)] 98.4 F (36.9 C) (05/06 0340) Pulse Rate:  [77-84] 80 (05/06 0340) Resp:  [15-26] 18 (05/06 0340) BP: (92-99)/(69-80) 92/76 (05/06 0340) SpO2:  [92 %-98 %] 95 % (05/06 0340) Weight:  [140 lb 10.5 oz (63.8 kg)] 140 lb 10.5 oz (63.8 kg) (05/06 0340) Last BM Date: 10/04/17  Weight change: Filed Weights   10/04/17 0645 10/05/17 0641 10/06/17 0340  Weight: 146 lb 2.6 oz (66.3 kg) 148 lb 9.4 oz (67.4 kg) 140 lb 10.5 oz (63.8 kg)    Intake/Output:   Intake/Output Summary (Last 24 hours) at 10/06/2017 0732 Last data filed at 10/06/2017 0505 Gross per 24 hour  Intake 882.7 ml  Output 1500 ml  Net -617.3 ml      Physical Exam    General:  Well appearing. No resp difficulty HEENT: normal Neck: supple. JVP 8. Carotids 2+ bilat; no bruits. No lymphadenopathy or thryomegaly appreciated. Cor: PMI nondisplaced. Regular rate & rhythm. No rubs, gallops. 3/6 HSM Lungs: clear on room air.  Abdomen: soft, nontender, nondistended. No hepatosplenomegaly. No bruits or masses. Good bowel sounds. Extremities: no cyanosis, clubbing, rash, edema Neuro: alert & orientedx3, cranial nerves grossly intact. moves all 4  extremities w/o difficulty. Affect pleasant    Telemetry   NSR 80s personally reviewed.   Labs    CBC Recent Labs    10/05/17 0231 10/06/17 0237  WBC 11.9* 12.6*  NEUTROABS 7.2 8.5*  HGB 9.8* 10.3*  HCT 31.9* 32.7*  MCV 82.9 81.3  PLT 300 309   Basic Metabolic Panel Recent Labs    40/98/11 0252 10/05/17 0231 10/06/17 0237  NA 129* 127* 127*  K 4.0 3.6 3.9  CL 94* 90* 91*  CO2 GLUCOSE 119* 103* 122*  BUN 33* 37* 35*  CREATININE 1.78* 1.78* 1.65*  CALCIUM 8.6* 8.5* 8.3*  MG 2.1  --   --    Liver Function Tests No results for input(s): AST, ALT, ALKPHOS, BILITOT, PROT, ALBUMIN in the last 72 hours. No results for input(s): LIPASE, AMYLASE in the last 72 hours. Cardiac Enzymes No results for input(s): CKTOTAL, CKMB, CKMBINDEX, TROPONINI in the last 72 hours.  BNP: BNP (last 3 results) Recent Labs    08/09/17 1607 09/30/17 1500  BNP 449.0* 706.9*    ProBNP (last 3 results) Recent Labs    09/29/17 1350  PROBNP 13,809*     D-Dimer No results for input(s): DDIMER in the last 72 hours. Hemoglobin A1C No results for input(s): HGBA1C in the last 72 hours. Fasting Lipid Panel No results for input(s): CHOL, HDL,  LDLCALC, TRIG, CHOLHDL, LDLDIRECT in the last 72 hours. Thyroid Function Tests No results for input(s): TSH, T4TOTAL, T3FREE, THYROIDAB in the last 72 hours.  Invalid input(s): FREET3  Other results:   Imaging    No results found.   Medications:     Scheduled Medications: . aspirin EC  81 mg Oral Daily  . atorvastatin  20 mg Oral q1800  . furosemide  40 mg Oral BID  . gabapentin  300 mg Oral TID  . oxyCODONE  5 mg Oral Once  . potassium chloride  40 mEq Oral Daily  . sodium chloride flush  3 mL Intravenous Q12H  . sodium chloride flush  3 mL Intravenous Q12H  . thiamine  100 mg Oral Daily    Infusions: . sodium chloride    . sodium chloride    . amiodarone 30 mg/hr (10/06/17 0505)  . heparin 1,800 Units/hr  (10/06/17 0506)    PRN Medications: sodium chloride, sodium chloride, acetaminophen, ondansetron (ZOFRAN) IV, oxyCODONE, sodium chloride flush, sodium chloride flush   Patient Profile   Marcus [name] is a 52 y.o. 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   Assessment/Plan   1. Acute diastolic CHF: Due to severe mitral regurgitation from perforated anterior leaflet.  He was admitted markedly volume overloaded on exam.  Symptoms were baseline, NYHA class III, likely due to a combination of deconditioning and CHF.  Echo showed that LV EF remains normal at 55-60%.  However, RV has mild to moderate systolic dysfunction. RHC 10/01/17 mildly elevated PCWP and pulmonary venous hypertension. Relatively preserved CO.   -Volume status stable. Continue po lasix. Renal function ok.  2. Severe mitral regurgitation: Due to prior endocarditis with MSSA. 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  - Soft BP (SBP 90s). No room for afterload reduction yet.  - Volume status improved.  - Once he is well-diuresed, he will need MV repair, this is planned for 5/8.  He had coronary angiography during a prior hospitalization, no obstructive CAD.  3. Aflutter with RVR: Back in NSR today on amiodarone gtt.   - Continue amiodarone gtt and heparin gtt pre-op.  4. 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  He has had a full course of antibiotic treatment and PICC is now out. Afebrile.  -WBC 12.6  - Blood cultures NGTD.  5. H/o septic arthritis: Left knee.  This significantly limits his mobility.  - PT/OT consults.  - No change to current plan.   6.  Right pleural effusion: Moderate on CXR this admission.  Chronic, likely due to CHF.  - Diuresis as above.  7. Polysubstance abuse:  Prior use of opioids. Encouraged complete cessation.  8. CKD: Stage 3.  Creatinine 1.65.   9. Diarrhea: He had C difficile back in March.   Had 3 loose bowel movements. Check for C Diff. WBC up from 11>12.6   Length of Stay: 6  Amy Clegg, NP  10/06/2017, 7:32 AM  Advanced Heart Failure Team Pager [phone] (M-F; 7a - 4p)  Please contact [facility] for night-coverage after hours (4p -7a ) and weekends on [email or web address]  Patient seen with NP, agree with the above note.  Stable in terms of CHF, continue po Lasix.  Weight trending down.  Working with PT.    He had more loose stool.  Will send for C difficile and make contact precautions.   Marca Ancona 10/06/2017 8:54 AM

## 2017-10-06 NOTE — Progress Notes (Signed)
      [address]       [place] [address]             [phone]     CARDIOTHORACIC SURGERY PROGRESS NOTE  5 Days Post-Op  S/P Procedure(s) (LRB): RIGHT HEART CATH (N/A)  Subjective: Reports that breathing is "great" but poor appetite and loose BM's ever since he was started on IV amiodarone.  Reports that diarrhea is "nowhere near as bad as C diff" but still uncomfortable.  Denies abdominal pain  Objective: Vital signs in last 24 hours: Temp:  [98.1 F (36.7 C)-99 F (37.2 C)] 99 F (37.2 C) (05/06 1144) Pulse Rate:  [79-99] 99 (05/06 1327) Cardiac Rhythm: Normal sinus rhythm (05/06 1200) Resp:  [18-26] 24 (05/06 1144) BP: (92-111)/(75-93) 95/80 (05/06 1144) SpO2:  [92 %-95 %] 93 % (05/06 1144) Weight:  [140 lb 10.5 oz (63.8 kg)] 140 lb 10.5 oz (63.8 kg) (05/06 0340)  Physical Exam:  Rhythm:   sinus  Breath sounds: clear  Heart sounds:  RRR w/ holosystolic murmur  Incisions:  n/a  Abdomen:  Soft, non-distended, non-tender  Extremities:  Warm, well-perfused    Intake/Output from previous day: 05/05 0701 - 05/06 0700 In: 882.7 [P.O.:577; I.V.:305.7] Out: 1500 [Urine:1500] Intake/Output this shift: Total I/O In: 320 [P.O.:120; I.V.:200] Out: 200 [Urine:200]  Lab Results: Recent Labs    10/05/17 0231 10/06/17 0237  WBC 11.9* 12.6*  HGB 9.8* 10.3*  HCT 31.9* 32.7*  PLT 300 309   BMET:  Recent Labs    10/05/17 0231 10/06/17 0237  NA 127* 127*  K 3.6 3.9  CL 90* 91*  CO2 25 25  GLUCOSE 103* 122*  BUN 37* 35*  CREATININE 1.78* 1.65*  CALCIUM 8.5* 8.3*    CBG (last 3)  No results for input(s): GLUCAP in the last 72 hours. PT/INR:  No results for input(s): LABPROT, INR in the last 72 hours.  CXR:  N/A  Assessment/Plan: S/P Procedure(s) (LRB): RIGHT HEART CATH (N/A)  Await f/u tests for C diff.  Recheck labs tomorrow.  I am hopeful that diarrhea is not a relapse of C diff and that we can still proceed with mitral valve  repair/replacement soon  I spent in excess of 15 minutes during the conduct of this hospital encounter and >50% of this time involved direct face-to-face encounter with the patient for counseling and/or coordination of their care.    Purcell Nails, MD 10/06/2017 4:04 PM

## 2017-10-06 NOTE — Progress Notes (Signed)
PT Cancellation Note  Patient Details Name: SAVOY SOMERVILLE MRN: [id number] DOB: Apr 27, 1966   Cancelled Treatment:    Reason Eval/Treat Not Completed: Patient at procedure or test/unavailable(pt currently working with OT)   Enedina Finner Latiqua Daloia 10/06/2017, 8:24 AM  Delaney Meigs, PT [phone]

## 2017-10-07 ENCOUNTER — Inpatient Hospital Stay (HOSPITAL_COMMUNITY): Payer: Medicaid Other

## 2017-10-07 ENCOUNTER — Encounter (HOSPITAL_COMMUNITY): Payer: Self-pay | Admitting: Certified Registered Nurse Anesthetist

## 2017-10-07 ENCOUNTER — Inpatient Hospital Stay (HOSPITAL_COMMUNITY): Payer: Medicaid Other | Admitting: Certified Registered Nurse Anesthetist

## 2017-10-07 LAB — CBC
HEMATOCRIT: 32.3 % — AB (ref 39.0–52.0)
Hemoglobin: 10 g/dL — ABNORMAL LOW (ref 13.0–17.0)
MCH: 25.3 pg — ABNORMAL LOW (ref 26.0–34.0)
MCHC: 31 g/dL (ref 30.0–36.0)
MCV: 81.8 fL (ref 78.0–100.0)
Platelets: 325 10*3/uL (ref 150–400)
RBC: 3.95 MIL/uL — ABNORMAL LOW (ref 4.22–5.81)
RDW: 16.4 % — ABNORMAL HIGH (ref 11.5–15.5)
WBC: 14.6 10*3/uL — ABNORMAL HIGH (ref 4.0–10.5)

## 2017-10-07 LAB — COMPREHENSIVE METABOLIC PANEL
ALT: 49 U/L (ref 17–63)
AST: 38 U/L (ref 15–41)
Albumin: 2.8 g/dL — ABNORMAL LOW (ref 3.5–5.0)
Alkaline Phosphatase: 98 U/L (ref 38–126)
Anion gap: 10 (ref 5–15)
BUN: 33 mg/dL — AB (ref 6–20)
CHLORIDE: 94 mmol/L — AB (ref 101–111)
CO2: 26 mmol/L (ref 22–32)
CREATININE: 1.62 mg/dL — AB (ref 0.61–1.24)
Calcium: 8.5 mg/dL — ABNORMAL LOW (ref 8.9–10.3)
GFR calc Af Amer: 55 mL/min — ABNORMAL LOW (ref >60)
GFR calc non Af Amer: 47 mL/min — ABNORMAL LOW (ref >60)
GLUCOSE: 111 mg/dL — AB (ref 65–99)
Potassium: 3.8 mmol/L (ref 3.5–5.1)
SODIUM: 130 mmol/L — AB (ref 135–145)
Total Bilirubin: 0.5 mg/dL (ref 0.3–1.2)
Total Protein: 7.1 g/dL (ref 6.5–8.1)

## 2017-10-07 LAB — ABO/RH: ABO/RH(D): O POS

## 2017-10-07 LAB — SURGICAL PCR SCREEN
MRSA, PCR: NEGATIVE
Staphylococcus aureus: NEGATIVE

## 2017-10-07 LAB — HEPARIN LEVEL (UNFRACTIONATED)
HEPARIN UNFRACTIONATED: 0.3 [IU]/mL (ref 0.30–0.70)
Heparin Unfractionated: 0.17 IU/mL — ABNORMAL LOW (ref 0.30–0.70)

## 2017-10-07 LAB — TYPE AND SCREEN
ABO/RH(D): O POS
Antibody Screen: NEGATIVE

## 2017-10-07 MED ORDER — TRANEXAMIC ACID (OHS) BOLUS VIA INFUSION
15.0000 mg/kg | INTRAVENOUS | Status: DC
  Filled 2017-10-07: qty 1032

## 2017-10-07 MED ORDER — KENNESTONE BLOOD CARDIOPLEGIA (KBC) MANNITOL SYRINGE (20%, 32ML)
32.0000 mL | Freq: Once | INTRAVENOUS | Status: DC
  Filled 2017-10-07: qty 32

## 2017-10-07 MED ORDER — MAGNESIUM SULFATE 50 % IJ SOLN
40.0000 meq | INTRAMUSCULAR | Status: DC
  Filled 2017-10-07: qty 9.85

## 2017-10-07 MED ORDER — VANCOMYCIN HCL 10 G IV SOLR
1250.0000 mg | INTRAVENOUS | Status: DC
  Filled 2017-10-07: qty 1250

## 2017-10-07 MED ORDER — DEXMEDETOMIDINE HCL IN NACL 400 MCG/100ML IV SOLN
0.1000 ug/kg/h | INTRAVENOUS | Status: DC
  Filled 2017-10-07: qty 100

## 2017-10-07 MED ORDER — KENNESTONE BLOOD CARDIOPLEGIA VIAL
13.0000 mL | Freq: Once | Status: DC
  Filled 2017-10-07: qty 13

## 2017-10-07 MED ORDER — VANCOMYCIN HCL 1000 MG IV SOLR
INTRAVENOUS | Status: DC
  Filled 2017-10-07: qty 1000

## 2017-10-07 MED ORDER — MILRINONE LACTATE IN DEXTROSE 20-5 MG/100ML-% IV SOLN
0.1250 ug/kg/min | INTRAVENOUS | Status: DC
  Filled 2017-10-07: qty 100

## 2017-10-07 MED ORDER — POTASSIUM CHLORIDE 2 MEQ/ML IV SOLN
80.0000 meq | INTRAVENOUS | Status: DC
  Filled 2017-10-07: qty 40

## 2017-10-07 MED ORDER — TRANEXAMIC ACID 1000 MG/10ML IV SOLN
1.5000 mg/kg/h | INTRAVENOUS | Status: DC
  Filled 2017-10-07: qty 25

## 2017-10-07 MED ORDER — CHLORHEXIDINE GLUCONATE 4 % EX LIQD
60.0000 mL | Freq: Once | CUTANEOUS | Status: AC
  Administered 2017-10-07: 4 via TOPICAL
  Filled 2017-10-07: qty 60

## 2017-10-07 MED ORDER — SODIUM CHLORIDE 0.9 % IV SOLN
30.0000 ug/min | INTRAVENOUS | Status: DC
  Filled 2017-10-07: qty 2

## 2017-10-07 MED ORDER — SODIUM CHLORIDE 0.9 % IV SOLN
INTRAVENOUS | Status: DC
  Filled 2017-10-07: qty 30

## 2017-10-07 MED ORDER — NITROGLYCERIN IN D5W 200-5 MCG/ML-% IV SOLN
2.0000 ug/min | INTRAVENOUS | Status: DC
  Filled 2017-10-07: qty 250

## 2017-10-07 MED ORDER — PLASMA-LYTE 148 IV SOLN
INTRAVENOUS | Status: DC
  Filled 2017-10-07: qty 2.5

## 2017-10-07 MED ORDER — EPINEPHRINE PF 1 MG/ML IJ SOLN
0.0000 ug/min | INTRAMUSCULAR | Status: DC
  Filled 2017-10-07: qty 4

## 2017-10-07 MED ORDER — TRANEXAMIC ACID (OHS) PUMP PRIME SOLUTION
2.0000 mg/kg | INTRAVENOUS | Status: DC
  Filled 2017-10-07: qty 1.38

## 2017-10-07 MED ORDER — CHLORHEXIDINE GLUCONATE 4 % EX LIQD
60.0000 mL | Freq: Once | CUTANEOUS | Status: DC
  Filled 2017-10-07: qty 60

## 2017-10-07 MED ORDER — SODIUM CHLORIDE 0.9 % IV SOLN
1.5000 g | INTRAVENOUS | Status: DC
  Filled 2017-10-07: qty 1.5

## 2017-10-07 MED ORDER — SODIUM CHLORIDE 0.9 % IV SOLN
INTRAVENOUS | Status: DC
  Filled 2017-10-07: qty 1

## 2017-10-07 MED ORDER — CHLORHEXIDINE GLUCONATE 0.12 % MT SOLN
15.0000 mL | Freq: Once | OROMUCOSAL | Status: AC
  Administered 2017-10-08: 15 mL via OROMUCOSAL
  Filled 2017-10-07: qty 15

## 2017-10-07 MED ORDER — SODIUM CHLORIDE 0.9 % IV SOLN
750.0000 mg | INTRAVENOUS | Status: DC
  Filled 2017-10-07: qty 750

## 2017-10-07 MED ORDER — DOPAMINE-DEXTROSE 3.2-5 MG/ML-% IV SOLN
0.0000 ug/kg/min | INTRAVENOUS | Status: DC
  Filled 2017-10-07: qty 250

## 2017-10-07 NOTE — Progress Notes (Signed)
Pt.was sitting in the commode to defecate and ask NT to collect specimen for c. Diff.  NT claimed that she put water in the commode container  And it is too late to collect the stool.

## 2017-10-07 NOTE — Progress Notes (Signed)
Called PT if they could come and see pt and ambulate as per MD,but claimed not coming to see him today.

## 2017-10-07 NOTE — Anesthesia Preprocedure Evaluation (Signed)
Anesthesia Evaluation    Reviewed: Allergy & Precautions, H&P , Patient's Chart, lab work & pertinent test results  History of Anesthesia Complications Negative for: history of anesthetic complications  Airway        Dental   Pulmonary neg pulmonary ROS, former smoker,    Pulmonary exam normal        Cardiovascular Exercise Tolerance: Good +CHF  + Valvular Problems/Murmurs MR   Study Conclusions Echo  - Left ventricle: The cavity size was mildly dilated. Wall   thickness was increased in a pattern of mild LVH. Systolic   function was normal. The estimated ejection fraction was in the   range of 55% to 60%. - Mitral valve: MV is thickened. Anterior mitral leaflet is shaggy   Suspicious for mobile echodensiity (vegetation) There is   torrential MR. - Left atrium: The atrium was moderately dilated. Narrative RHCath 1. Mildly elevated PCWP with prominent v-waves.  2. Primarily pulmonary venous hypertension.  3. Elevated RA pressure.  Show more  4. Relatively preserved cardiac output.       Neuro/Psych negative neurological ROS  negative psych ROS   GI/Hepatic negative GI ROS, (+)     substance abuse  IV drug use, Hepatitis -, CHx of C.Diff   Endo/Other  negative endocrine ROS  Renal/GU Renal InsufficiencyRenal disease  negative genitourinary   Musculoskeletal   Abdominal   Peds  Hematology negative hematology ROS (+) anemia ,   Anesthesia Other Findings   Reproductive/Obstetrics negative OB ROS                             Anesthesia Physical  Anesthesia Plan  ASA: IV  Anesthesia Plan: General   Post-op Pain Management:    Induction: Intravenous  PONV Risk Score and Plan: 3 and Ondansetron, Dexamethasone and Diphenhydramine  Airway Management Planned: Oral ETT  Additional Equipment: Arterial line, PA Cath, 3D TEE and Ultrasound Guidance Line Placement  Intra-op  Plan:   Post-operative Plan: Post-operative intubation/ventilation  Informed Consent:   Plan Discussed with:   Anesthesia Plan Comments:         Anesthesia Quick Evaluation

## 2017-10-07 NOTE — Progress Notes (Signed)
Had another BM mixed with urine, unable to collect specimen for c.diff. With external hemorrhoid bleeding minimally. Continue to monitor.

## 2017-10-07 NOTE — Progress Notes (Addendum)
ANTICOAGULATION CONSULT NOTE   Pharmacy Consult for heparin Indication: atrial fibrillation  No Known Allergies  Patient Measurements: Height:  (180.3 cm) Weight: 151 lb 9.6 oz (68.8 kg) IBW/kg (Calculated) : 75.3 Heparin Dosing Weight: 68 kg  Vital Signs: Temp: 98.1 F (36.7 C) (05/07 0328) Temp Source: Oral (05/07 0328) BP: 94/68 (05/07 0328) Pulse Rate: 81 (05/07 0328)  Labs: Recent Labs    10/05/17 0231  10/05/17 2017 10/06/17 0237 10/07/17 0302  HGB 9.8*  --   --  10.3* 10.0*  HCT 31.9*  --   --  32.7* 32.3*  PLT 300  --   --  309 325  HEPARINUNFRC  --    < > 0.50 0.35 0.17*  CREATININE 1.78*  --   --  1.65* 1.62*   < > = values in this interval not displayed.    Estimated Creatinine Clearance: 51.9 mL/min (A) (by C-G formula based on SCr of 1.62 mg/dL (H)).  Assessment: 21 yoM with HFpEF developed AFlutter and started on IV heparin.  Heparin level low this morning at 0.17, per RN no issues with infusion.   Goal of Therapy:  Heparin level 0.3-0.7 units/ml Monitor platelets by anticoagulation protocol: Yes   Plan: -Heparin to 1900 units/hr -Recheck 6-hr heparin level -Daily heparin level and CBC  ADDENDUM: Repeat heparin level therapeutic at 0.30.  Plan: -Increase heparin to 1950 units/hr -Recheck level with am labs  Fredonia Highland, PharmD, BCPS PGY-2 Cardiology Pharmacy Resident Pager: [phone] 10/07/2017

## 2017-10-07 NOTE — Progress Notes (Addendum)
Patient ID: Marcus [name], male   DOB: 1966-03-23, 52 y.o.   MRN: [id number]     Advanced Heart Failure Rounding Note  PCP-Cardiologist: Armanda Magic, MD  HF Cardiology: Shirlee Latch  Subjective:    Denies SOB. Still with mild abdominal discomfort. No BMs over night.   RHC 10/01/17 RHC Procedural Findings: Hemodynamics (mmHg) RA mean 11 RV 47/15 PA 49/25, mean 38 PCWP mean 19, v-waves to 35 Oxygen saturations: PA 52% AO 94% Cardiac Output (Fick) 4.35  Cardiac Index (Fick) 2.35  PVR 4.4 WU  Objective:   Weight Range: 151 lb 9.6 oz (68.8 kg) Body mass index is 21.14 kg/m.   Vital Signs:   Temp:  [97.4 F (36.3 C)-99 F (37.2 C)] 98.1 F (36.7 C) (05/07 0328) Pulse Rate:  [80-99] 81 (05/07 0328) Resp:  [15-24] 15 (05/07 0328) BP: (94-99)/(68-83) 94/68 (05/07 0328) SpO2:  [93 %-95 %] 94 % (05/07 0328) Weight:  [151 lb 9.6 oz (68.8 kg)] 151 lb 9.6 oz (68.8 kg) (05/07 0520) Last BM Date: 10/06/17  Weight change: Filed Weights   10/05/17 0641 10/06/17 0340 10/07/17 0520  Weight: 148 lb 9.4 oz (67.4 kg) 140 lb 10.5 oz (63.8 kg) 151 lb 9.6 oz (68.8 kg)    Intake/Output:   Intake/Output Summary (Last 24 hours) at 10/07/2017 0743 Last data filed at 10/07/2017 0520 Gross per 24 hour  Intake 1616.46 ml  Output 1100 ml  Net 516.46 ml      Physical Exam    General:  No resp difficulty. In bed.  HEENT: normal Neck: supple. no JVD. Carotids 2+ bilat; no bruits. No lymphadenopathy or thryomegaly appreciated. Cor: PMI nondisplaced. Regular rate & rhythm. No rubs, gallops. 3/6 HSM. Lungs: clear Abdomen: soft, nontender, nondistended. No hepatosplenomegaly. No bruits or masses. Good bowel sounds. Extremities: no cyanosis, clubbing, rash, edema Neuro: alert & orientedx3, cranial nerves grossly intact. moves all 4 extremities w/o difficulty. Affect pleasant     Telemetry   NSR 80s personally reviewed.   Labs    CBC Recent Labs    10/05/17 0231 10/06/17 0237  10/07/17 0302  WBC 11.9* 12.6* 14.6*  NEUTROABS 7.2 8.5*  --   HGB 9.8* 10.3* 10.0*  HCT 31.9* 32.7* 32.3*  MCV 82.9 81.3 81.8  PLT 300 309 325   Basic Metabolic Panel Recent Labs    28/41/32 0237 10/07/17 0302  NA 127* 130*  K 3.9 3.8  CL 91* 94*  CO2 25 26  GLUCOSE 122* 111*  BUN 35* 33*  CREATININE 1.65* 1.62*  CALCIUM 8.3* 8.5*   Liver Function Tests Recent Labs    10/07/17 0302  AST 38  ALT 49  ALKPHOS 98  BILITOT 0.5  PROT 7.1  ALBUMIN 2.8*   No results for input(s): LIPASE, AMYLASE in the last 72 hours. Cardiac Enzymes No results for input(s): CKTOTAL, CKMB, CKMBINDEX, TROPONINI in the last 72 hours.  BNP: BNP (last 3 results) Recent Labs    08/09/17 1607 09/30/17 1500  BNP 449.0* 706.9*    ProBNP (last 3 results) Recent Labs    09/29/17 1350  PROBNP 13,809*     D-Dimer No results for input(s): DDIMER in the last 72 hours. Hemoglobin A1C No results for input(s): HGBA1C in the last 72 hours. Fasting Lipid Panel No results for input(s): CHOL, HDL, LDLCALC, TRIG, CHOLHDL, LDLDIRECT in the last 72 hours. Thyroid Function Tests No results for input(s): TSH, T4TOTAL, T3FREE, THYROIDAB in the last 72 hours.  Invalid input(s): FREET3  Other  results:   Imaging    No results found.   Medications:     Scheduled Medications: . aspirin EC  81 mg Oral Daily  . atorvastatin  20 mg Oral q1800  . furosemide  40 mg Oral BID  . gabapentin  300 mg Oral TID  . potassium chloride  40 mEq Oral Daily  . sodium chloride flush  3 mL Intravenous Q12H  . sodium chloride flush  3 mL Intravenous Q12H  . thiamine  100 mg Oral Daily    Infusions: . sodium chloride    . sodium chloride    . amiodarone 30 mg/hr (10/07/17 0337)  . heparin 1,900 Units/hr (10/07/17 0647)    PRN Medications: sodium chloride, sodium chloride, acetaminophen, ondansetron (ZOFRAN) IV, oxyCODONE, sodium chloride flush, sodium chloride flush   Patient Profile   FED CECI is a 52 y.o. 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   Assessment/Plan   1. Acute diastolic CHF: Due to severe mitral regurgitation from perforated anterior leaflet.  He was admitted markedly volume overloaded on exam.  Symptoms were baseline, NYHA class III, likely due to a combination of deconditioning and CHF.  Echo showed that LV EF remains normal at 55-60%.  However, RV has mild to moderate systolic dysfunction. RHC 10/01/17 mildly elevated PCWP and pulmonary venous hypertension. Relatively preserved CO.   -Volume status ok. Continue po lasix. Renal function ok.  2. Severe mitral regurgitation: Due to prior endocarditis with MSSA. 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  - Soft BP (SBP 90s). No room for afterload reduction yet.  - Volume status improved.  - Once he is well-diuresed, he will need MV repair, this is planned for 5/8.  He had coronary angiography during a prior hospitalization, no obstructive CAD.  3. Aflutter with RVR: Maintaining NSR. .   - Continue amiodarone gtt and heparin gtt pre-op.  4. 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  He has had a full course of antibiotic treatment and PICC is now out. Afebrile.  -WBC 12.6 >14.6 - Blood cultures NGTD.  5. H/o septic arthritis: Left knee.  This significantly limits his mobility.  - PT/OT consults.  - No change to current plan.   6. Right pleural effusion: Moderate on CXR this admission.  Chronic, likely due to CHF.  - Diuresis as above.  7. Polysubstance abuse:  Prior use of opioids. Encouraged complete cessation.  8. CKD: Stage 3.   Creatinine 1.62  9. Diarrhea: He had C difficile back in March.   . Check for C Diff. WBC up from 11>12.6 >14..6 Afebrile.   Length of Stay: 7  Amy Clegg, NP  10/07/2017, 7:43 AM  Advanced Heart Failure Team Pager [phone] (M-F; 7a - 4p)  Please contact [facility] for night-coverage after hours (4p -7a ) and weekends on [email or web address]  Patient seen with NP, agree with the above note.  He has some abdominal discomfort but has had only 1 BM in the last 24 hrs (was flushed so no sample sent for C difficile).  Given the minimal stool output, suspect he does not have active C difficile.  It is possible that the  abdominal discomfort is related to the amiodarone.  We still have not had a stool sample to actually send for C difficile testing.   Volume status looks stable on Lasix 40 mg po bid, will continue.   Maintaining NSR on amiodarone gtt, continue.   Plan for MV surgery tomorrow with Dr. Cornelius Moras.   Marca Ancona 10/07/2017 1:56 PM

## 2017-10-07 NOTE — Progress Notes (Signed)
[address]       [place] [address]             [phone]     CARDIOTHORACIC SURGERY PROGRESS NOTE  6 Days Post-Op  S/P Procedure(s) (LRB): RIGHT HEART CATH (N/A)  Subjective: Feels okay.  No abdominal pain.  Still some intermittent nausea.  No diarrhea today.  One stool today that was reported "pasty" in appearance and mixed with urine in commode - so not sent off.  Objective: Vital signs in last 24 hours: Temp:  [98 F (36.7 C)-98.7 F (37.1 C)] 98.7 F (37.1 C) (05/07 1255) Pulse Rate:  [81-87] 87 (05/07 1255) Cardiac Rhythm: Normal sinus rhythm (05/07 1606) Resp:  [15-19] 19 (05/07 1255) BP: (93-99)/(62-78) 99/69 (05/07 1255) SpO2:  [92 %-95 %] 93 % (05/07 1606) Weight:  [151 lb 9.6 oz (68.8 kg)] 151 lb 9.6 oz (68.8 kg) (05/07 0520)  Physical Exam:  Rhythm:   sinus  Breath sounds: Diminished right base  Heart sounds:  RRR w/ holosystolic murmur  Incisions:  n/a  Abdomen:  Soft, non-distended, non-tender  Extremities:  Warm, well perfused   Intake/Output from previous day: 05/06 0701 - 05/07 0700 In: 1616.5 [P.O.:840; I.V.:776.5] Out: 1100 [Urine:1100] Intake/Output this shift: Total I/O In: 285.6 [I.V.:285.6] Out: -   Lab Results: Recent Labs    10/06/17 0237 10/07/17 0302  WBC 12.6* 14.6*  HGB 10.3* 10.0*  HCT 32.7* 32.3*  PLT 309 325   BMET:  Recent Labs    10/06/17 0237 10/07/17 0302  NA 127* 130*  K 3.9 3.8  CL 91* 94*  CO2 25 26  GLUCOSE 122* 111*  BUN 35* 33*  CREATININE 1.65* 1.62*  CALCIUM 8.3* 8.5*    CBG (last 3)  No results for input(s): GLUCAP in the last 72 hours. PT/INR:  No results for input(s): LABPROT, INR in the last 72 hours.  CXR:  CHEST - 2 VIEW  COMPARISON:  09/29/2017  FINDINGS: Enlargement of cardiac silhouette.  Mediastinal contours and pulmonary vascularity normal.  Moderate RIGHT pleural effusion and basilar atelectasis.  Minimal LEFT pleural effusion and LEFT basilar  atelectasis.  Upper lungs clear.  No pneumothorax.  Bones unremarkable.  IMPRESSION: BILATERAL pleural effusions and basilar atelectasis, moderate on RIGHT and minimal on LEFT.  No significant interval change.   Electronically Signed   By: Ulyses Southward M.D.   On: 10/07/2017 09:40  Assessment/Plan: S/P Procedure(s) (LRB): RIGHT HEART CATH (N/A)  I do not feel that Mr Winograd is likely to have persistent/recurrent C diff enterocolitis.  I suspect that nausea may be related to amiodarone.  Discussed the potential for increased risks associated with proceeding through open heart surgery in the setting of ongoing colitis or other abdominal process.  Mr Diffley accepts these risks and desires to proceed with surgery tomorrow as previously planned.  He was again counseled at length regarding the indications, risks and potential benefits of mitral valve repair or replacement.  The rationale for elective surgery has been explained, including a comparison between surgery and continued medical therapy with close follow-up.  The likelihood of successful and durable valve repair has been discussed with particular reference to the findings of their recent echocardiogram.  Based upon these findings and previous experience, I have quoted him a 50 percent likelihood of successful valve repair.  In the event that their valve cannot be successfully repaired, we discussed the possibility of replacing the mitral valve using a mechanical  prosthesis with the attendant need for long-term anticoagulation versus the alternative of replacing it using a bioprosthetic tissue valve with its potential for late structural valve deterioration and failure, depending upon the patient's longevity.  The patient specifically requests that if the mitral valve must be replaced that it be done using a bioprosthetic tissue valve.   The patient understands and accepts all potential risks of surgery including but not limited to risk of  death, stroke or other neurologic complication, myocardial infarction, congestive heart failure, respiratory failure, renal failure, bleeding requiring transfusion and/or reexploration, arrhythmia, infection or other wound complications, pneumonia, pleural and/or pericardial effusion, pulmonary embolus, aortic dissection or other major vascular complication, or delayed complications related to valve repair or replacement including but not limited to structural valve deterioration and failure, thrombosis, embolization, endocarditis, or paravalvular leak.  Alternative surgical approaches have been discussed including a comparison between conventional sternotomy and minimally-invasive techniques.  The relative risks and benefits of each have been reviewed as they pertain to the patient's specific circumstances, and all of their questions have been addressed.  Specific risks potentially related to the minimally-invasive approach were discussed at length, including but not limited to risk of conversion to full or partial sternotomy, aortic dissection or other major vascular complication, unilateral acute lung injury or pulmonary edema, phrenic nerve dysfunction or paralysis, rib fracture, chronic pain, lung hernia, or lymphocele.  All of their questions have been answered.  For OR tomorrow.  I spent in excess of 30 minutes during the conduct of this hospital encounter and >50% of this time involved direct face-to-face encounter with the patient for counseling and/or coordination of their care.    Purcell Nails, MD 10/07/2017 5:36 PM

## 2017-10-08 ENCOUNTER — Ambulatory Visit (INDEPENDENT_AMBULATORY_CARE_PROVIDER_SITE_OTHER): Payer: Self-pay | Admitting: Physician Assistant

## 2017-10-08 ENCOUNTER — Other Ambulatory Visit (HOSPITAL_COMMUNITY): Payer: Self-pay

## 2017-10-08 ENCOUNTER — Inpatient Hospital Stay (HOSPITAL_COMMUNITY): Payer: Medicaid Other

## 2017-10-08 ENCOUNTER — Encounter (HOSPITAL_COMMUNITY)
Admission: EM | Disposition: A | Discharge status: Home Health | Payer: Self-pay | Source: Home / Self Care | Attending: Cardiology

## 2017-10-08 ENCOUNTER — Inpatient Hospital Stay (HOSPITAL_COMMUNITY)
Admission: RE | Admit: 2017-10-08 | Payer: Self-pay | Source: Ambulatory Visit | Admitting: Thoracic Surgery (Cardiothoracic Vascular Surgery)

## 2017-10-08 LAB — CBC
HEMATOCRIT: 32.2 % — AB (ref 39.0–52.0)
Hemoglobin: 10.2 g/dL — ABNORMAL LOW (ref 13.0–17.0)
MCH: 25.6 pg — ABNORMAL LOW (ref 26.0–34.0)
MCHC: 31.7 g/dL (ref 30.0–36.0)
MCV: 80.9 fL (ref 78.0–100.0)
Platelets: 300 10*3/uL (ref 150–400)
RBC: 3.98 MIL/uL — ABNORMAL LOW (ref 4.22–5.81)
RDW: 16.7 % — AB (ref 11.5–15.5)
WBC: 16.9 10*3/uL — ABNORMAL HIGH (ref 4.0–10.5)

## 2017-10-08 LAB — BASIC METABOLIC PANEL
Anion gap: 10 (ref 5–15)
BUN: 30 mg/dL — AB (ref 6–20)
CALCIUM: 8.3 mg/dL — AB (ref 8.9–10.3)
CO2: 25 mmol/L (ref 22–32)
Chloride: 96 mmol/L — ABNORMAL LOW (ref 101–111)
Creatinine, Ser: 1.76 mg/dL — ABNORMAL HIGH (ref 0.61–1.24)
GFR calc Af Amer: 50 mL/min — ABNORMAL LOW (ref >60)
GFR, EST NON AFRICAN AMERICAN: 43 mL/min — AB (ref >60)
GLUCOSE: 126 mg/dL — AB (ref 65–99)
Potassium: 3.3 mmol/L — ABNORMAL LOW (ref 3.5–5.1)
Sodium: 131 mmol/L — ABNORMAL LOW (ref 135–145)

## 2017-10-08 LAB — HEMOGLOBIN A1C
HEMOGLOBIN A1C: 5.4 % (ref 4.8–5.6)
MEAN PLASMA GLUCOSE: 108.28 mg/dL

## 2017-10-08 LAB — HEPATIC FUNCTION PANEL
ALK PHOS: 93 U/L (ref 38–126)
ALT: 43 U/L (ref 17–63)
AST: 34 U/L (ref 15–41)
Albumin: 2.5 g/dL — ABNORMAL LOW (ref 3.5–5.0)
BILIRUBIN DIRECT: 0.2 mg/dL (ref 0.1–0.5)
BILIRUBIN INDIRECT: 0.7 mg/dL (ref 0.3–0.9)
Total Bilirubin: 0.9 mg/dL (ref 0.3–1.2)
Total Protein: 6.7 g/dL (ref 6.5–8.1)

## 2017-10-08 LAB — C DIFFICILE QUICK SCREEN W PCR REFLEX
C DIFFICILE (CDIFF) INTERP: DETECTED
C Diff antigen: POSITIVE — AB
C Diff toxin: POSITIVE — AB

## 2017-10-08 SURGERY — REPAIR, MITRAL VALVE, MINIMALLY INVASIVE
Anesthesia: General | Site: Chest | Laterality: Right

## 2017-10-08 MED ORDER — VANCOMYCIN 50 MG/ML ORAL SOLUTION
125.0000 mg | Freq: Four times a day (QID) | ORAL | Status: AC
  Administered 2017-10-08 – 2017-10-22 (×56): 125 mg via ORAL
  Filled 2017-10-08 (×58): qty 2.5

## 2017-10-08 MED ORDER — POTASSIUM CHLORIDE CRYS ER 20 MEQ PO TBCR
40.0000 meq | EXTENDED_RELEASE_TABLET | Freq: Once | ORAL | Status: AC
  Administered 2017-10-08: 40 meq via ORAL
  Filled 2017-10-08: qty 2

## 2017-10-08 MED ORDER — VANCOMYCIN 50 MG/ML ORAL SOLUTION: 125.0000 mg | ORAL | Status: DC

## 2017-10-08 MED ORDER — IOPAMIDOL (ISOVUE-370) INJECTION 76%
100.0000 mL | Freq: Once | INTRAVENOUS | Status: AC | PRN
  Administered 2017-10-08: 80 mL via INTRAVENOUS

## 2017-10-08 MED ORDER — IOPAMIDOL (ISOVUE-370) INJECTION 76%
INTRAVENOUS | Status: AC
Start: 2017-10-08 — End: 2017-10-09
  Filled 2017-10-08: qty 100

## 2017-10-08 MED ORDER — IOPAMIDOL (ISOVUE-300) INJECTION 61%: 30.0000 mL | Freq: Once | INTRAVENOUS | Status: DC | PRN

## 2017-10-08 MED ORDER — CHLORHEXIDINE GLUCONATE 4 % EX LIQD
CUTANEOUS | Status: AC
  Filled 2017-10-08: qty 15

## 2017-10-08 MED ORDER — VANCOMYCIN 50 MG/ML ORAL SOLUTION: 125.0000 mg | Freq: Two times a day (BID) | ORAL | Status: DC

## 2017-10-08 MED ORDER — IOPAMIDOL (ISOVUE-300) INJECTION 61%
INTRAVENOUS | Status: AC
  Administered 2017-10-08: 09:00:00
  Filled 2017-10-08: qty 30

## 2017-10-08 MED ORDER — VANCOMYCIN 50 MG/ML ORAL SOLUTION: 125.0000 mg | Freq: Every day | ORAL | Status: DC

## 2017-10-08 NOTE — Progress Notes (Signed)
Microbiology called and relayed that stool + c-diff. Relayed to PA. to start with vanco.

## 2017-10-08 NOTE — Progress Notes (Signed)
  CT Abd Pelvis with diffuse colonic wall thickening suggestive of Colitis.   Spoke personally with Dr. Ninetta Lights from Infectious disease. With recent history of C-Diff, recurrent diarrhea, and CT, would treat "empirically" with Vancomycin.   All above reviewed with Attending, Dr. Lillette Boxer "[address] [place], [place] 10/08/2017 4:25 PM

## 2017-10-08 NOTE — Progress Notes (Signed)
Back from short stay with cancelled surgery.

## 2017-10-08 NOTE — Progress Notes (Signed)
      [address]       [place] [address]             [phone]     CARDIOTHORACIC SURGERY PROGRESS NOTE  Day of Surgery  S/P Procedure(s) (LRB): MINIMALLY INVASIVE MITRAL VALVE REPAIR OR REPLACEMENT(MVR) (Right) TRANSESOPHAGEAL ECHOCARDIOGRAM (TEE) (N/A)  Subjective: Patient has developed worsening abdominal pain overnight and had another episode of diarrhea.  He denies SOB.  Objective: Vital signs in last 24 hours: Temp:  [98.3 F (36.8 C)-99.1 F (37.3 C)] 99.1 F (37.3 C) (05/08 0345) Pulse Rate:  [83-90] 87 (05/08 0345) Cardiac Rhythm: Normal sinus rhythm (05/08 0701) Resp:  [14-21] 17 (05/08 0345) BP: (98-110)/(66-78) 98/66 (05/08 0345) SpO2:  [91 %-96 %] 91 % (05/08 0345) Weight:  [149 lb 8 oz (67.8 kg)] 149 lb 8 oz (67.8 kg) (05/08 0300)  Physical Exam:  Rhythm:   sinus  Breath sounds: Diminished right base  Heart sounds:  RRR w/ holosystolic murmur  Incisions:  n/a  Abdomen:  Mildly distended and tender diffusely, no rebound/guarding  Extremities:  Warm, well-perfused    Intake/Output from previous day: 05/07 0701 - 05/08 0700 In: 789.1 [P.O.:200; I.V.:589.1] Out: -  Intake/Output this shift: No intake/output data recorded.  Lab Results: Recent Labs    10/07/17 0302 10/08/17 0520  WBC 14.6* 16.9*  HGB 10.0* 10.2*  HCT 32.3* 32.2*  PLT 325 300   BMET:  Recent Labs    10/07/17 0302 10/08/17 0520  NA 130* 131*  K 3.8 3.3*  CL 94* 96*  CO2 26 25  GLUCOSE 111* 126*  BUN 33* 30*  CREATININE 1.62* 1.76*  CALCIUM 8.5* 8.3*    CBG (last 3)  No results for input(s): GLUCAP in the last 72 hours. PT/INR:  No results for input(s): LABPROT, INR in the last 72 hours.  CXR:  N/A  Assessment/Plan: S/P Procedure(s) (LRB): MINIMALLY INVASIVE MITRAL VALVE REPAIR OR REPLACEMENT(MVR) (Right) TRANSESOPHAGEAL ECHOCARDIOGRAM (TEE) (N/A)  Patient has worsening abdominal pain, diarrhea, and worsening leukocytosis.  Under the  circumstances I feel that we have no choice but to postpone plans for mitral valve repair or replacement.  Will stop amiodarone and order CT scan of the abdomen and pelvis.  Continue enteric precautions and obtain stool specimen (which never got sent as previously ordered).  We will discuss with the advanced heart failure team.  Discussed with patient and his family at the bedside.  All questions answered.   I spent in excess of 15 minutes during the conduct of this hospital encounter and >50% of this time involved direct face-to-face encounter with the patient for counseling and/or coordination of their care.    Purcell Nails, MD 10/08/2017 7:32 AM

## 2017-10-08 NOTE — Progress Notes (Addendum)
Patient ID: Marcus [name], male   DOB: 1966/03/03, 52 y.o.   MRN: [id number]     Advanced Heart Failure Rounding Note  PCP-Cardiologist: Armanda Magic, MD  HF Cardiology: Shirlee Latch  Subjective:    Had 2 BMs this morning. Complaining of abdominal pain. Surgery cancelled. Stool was never sent for possible C diff. WBC trending up 12>14>16.   Denies SOB.   RHC 10/01/17 RHC Procedural Findings: Hemodynamics (mmHg) RA mean 11 RV 47/15 PA 49/25, mean 38 PCWP mean 19, v-waves to 35 Oxygen saturations: PA 52% AO 94% Cardiac Output (Fick) 4.35  Cardiac Index (Fick) 2.35  PVR 4.4 WU  Objective:   Weight Range: 149 lb 8 oz (67.8 kg) Body mass index is 20.85 kg/m.   Vital Signs:   Temp:  [98.3 F (36.8 C)-99.1 F (37.3 C)] 99.1 F (37.3 C) (05/08 0345) Pulse Rate:  [83-90] 87 (05/08 0345) Resp:  [14-21] 17 (05/08 0345) BP: (98-110)/(66-78) 98/66 (05/08 0345) SpO2:  [91 %-96 %] 91 % (05/08 0345) Weight:  [149 lb 8 oz (67.8 kg)] 149 lb 8 oz (67.8 kg) (05/08 0300) Last BM Date: 10/06/17  Weight change: Filed Weights   10/06/17 0340 10/07/17 0520 10/08/17 0300  Weight: 140 lb 10.5 oz (63.8 kg) 151 lb 9.6 oz (68.8 kg) 149 lb 8 oz (67.8 kg)    Intake/Output:   Intake/Output Summary (Last 24 hours) at 10/08/2017 0754 Last data filed at 10/08/2017 0630 Gross per 24 hour  Intake 789.05 ml  Output -  Net 789.05 ml      Physical Exam    General:  No resp difficulty. In bed.  HEENT: normal Neck: supple. JVP 5-6 . Carotids 2+ bilat; no bruits. No lymphadenopathy or thryomegaly appreciated. Cor: PMI nondisplaced. Regular rate & rhythm. No rubs, gallops. 3/6 HSMLungs: clear Abdomen: soft, tender, ++ distended. No hepatosplenomegaly. No bruits or masses. Good bowel sounds. Extremities: no cyanosis, clubbing, rash, trace edema.  Neuro: alert & orientedx3, cranial nerves grossly intact. moves all 4 extremities w/o difficulty. Affect Flat      Telemetry   NSR 80s personally reviewed.     Labs    CBC Recent Labs    10/06/17 0237 10/07/17 0302 10/08/17 0520  WBC 12.6* 14.6* 16.9*  NEUTROABS 8.5*  --   --   HGB 10.3* 10.0* 10.2*  HCT 32.7* 32.3* 32.2*  MCV 81.3 81.8 80.9  PLT 309 325 300   Basic Metabolic Panel Recent Labs    78/29/56 0302 10/08/17 0520  NA 130* 131*  K 3.8 3.3*  CL 94* 96*  CO2 26 25  GLUCOSE 111* 126*  BUN 33* 30*  CREATININE 1.62* 1.76*  CALCIUM 8.5* 8.3*   Liver Function Tests Recent Labs    10/07/17 0302  AST 38  ALT 49  ALKPHOS 98  BILITOT 0.5  PROT 7.1  ALBUMIN 2.8*   No results for input(s): LIPASE, AMYLASE in the last 72 hours. Cardiac Enzymes No results for input(s): CKTOTAL, CKMB, CKMBINDEX, TROPONINI in the last 72 hours.  BNP: BNP (last 3 results) Recent Labs    08/09/17 1607 09/30/17 1500  BNP 449.0* 706.9*    ProBNP (last 3 results) Recent Labs    09/29/17 1350  PROBNP 13,809*     D-Dimer No results for input(s): DDIMER in the last 72 hours. Hemoglobin A1C Recent Labs    10/08/17 0520  HGBA1C 5.4   Fasting Lipid Panel No results for input(s): CHOL, HDL, LDLCALC, TRIG, CHOLHDL, LDLDIRECT in the last  72 hours. Thyroid Function Tests No results for input(s): TSH, T4TOTAL, T3FREE, THYROIDAB in the last 72 hours.  Invalid input(s): FREET3  Other results:   Imaging    No results found.   Medications:     Scheduled Medications: . [MAR Hold] atorvastatin  20 mg Oral q1800  . chlorhexidine  60 mL Topical Once  . chlorhexidine      . [MAR Hold] furosemide  40 mg Oral BID  . [MAR Hold] gabapentin  300 mg Oral TID  . heparin-papaverine-plasmalyte irrigation   Irrigation To OR  . [place] Blood Cardioplegia (KBC) lidocaine 2% Syringe (13mL)  13 mL Intracoronary Once  . [place] Blood Cardioplegia (KBC) lidocaine 2% Syringe (13mL)  13 mL Intracoronary Once  . [place] Blood Cardioplegia (KBC) mannitol 20% Syringe (32mL)  32 mL Intracoronary Once  . [place] Blood Cardioplegia  (KBC) mannitol 20% Syringe (32mL)  32 mL Intracoronary Once  . magnesium sulfate  40 mEq Other To OR  . potassium chloride  80 mEq Other To OR  . [MAR Hold] potassium chloride  40 mEq Oral Daily  . [MAR Hold] sodium chloride flush  3 mL Intravenous Q12H  . [MAR Hold] sodium chloride flush  3 mL Intravenous Q12H  . [MAR Hold] thiamine  100 mg Oral Daily  . tranexamic acid  15 mg/kg Intravenous To OR  . tranexamic acid  2 mg/kg Intracatheter To OR  . vancomycin 1000 mg in NS (1000 ml) irrigation for Dr. Cornelius Moras case   Irrigation To OR    Infusions: . [MAR Hold] sodium chloride    . [MAR Hold] sodium chloride    . cefUROXime (ZINACEF)  IV    . dexmedetomidine    . DOPamine    . epinephrine    . heparin 30,000 units/NS 1000 mL solution for CELLSAVER    . insulin (NOVOLIN-R) infusion    . milrinone    . nitroGLYCERIN    . phenylephrine /267mL NS (0.08mg /ml) infusion    . tranexamic acid (CYKLOKAPRON) infusion ([facility])    . vancomycin      PRN Medications: [MAR Hold] sodium chloride, [MAR Hold] sodium chloride, [MAR Hold] acetaminophen, [MAR Hold] ondansetron (ZOFRAN) IV, [MAR Hold] oxyCODONE, [MAR Hold] sodium chloride flush, [MAR Hold] sodium chloride flush   Patient Profile   Marcus [name] is a 52 y.o. 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   Assessment/Plan   1. Acute diastolic CHF: Due to severe mitral regurgitation from perforated anterior leaflet.  He was admitted markedly volume overloaded on exam.  Symptoms were baseline, NYHA class III, likely due to a combination of deconditioning and CHF.  Echo showed that LV EF remains normal at 55-60%.  However, RV has mild to moderate systolic dysfunction.  RHC 10/01/17 mildly elevated PCWP and pulmonary venous hypertension. Relatively preserved CO.   -Volume status ok. Continue po lasix.  -Renal function ok.  2. Severe mitral regurgitation: Due to prior endocarditis with MSSA. 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  - Soft BPs. No room for afterload reduction yet.  - Volume status improved.  -MVR on hold with rising WBC.  3. Aflutter with RVR: - Maintaining NSR. Stop amio and heparin.   4.  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  He has had a full course of antibiotic treatment and PICC is now out. Afebrile.  -WBC 12.6 >14.6>16.9 - Blood cultures NGTD.  5. H/o septic arthritis: Left knee.  This significantly limits his mobility.  - PT/OT consults.  - No change to current plan.   6. Right pleural effusion: Moderate on CXR this admission.  Chronic, likely due to CHF.  - Diuresis as above.  7. Polysubstance abuse:  Prior use of opioids. Encouraged complete cessation.  8. CKD: Stage 3.  Creatinine 1.76 9. Diarrhea: He had C difficile back in March.   . Check for C Diff. WBC up from 11>12.6 >14..6>16.9 Stool was not sent . Will need to send today.  10. Abdominal Pain WBC trending up. Send for CT of abdomen without contrast now.   Length of Stay: 8  Amy Clegg, NP  10/08/2017, 7:54 AM  Advanced Heart Failure Team Pager [phone] (M-F; 7a - 4p)  Please contact [facility] for night-coverage after hours (4p -7a ) and weekends on [email or web address]  Patient seen with NP, agree with the above note.  Increasing abdominal pain overnight and had further loose stool.  WBC up to 16.9. Sample still not sent for C difficile.   Amiodarone and heparin gtts stopped.  He thinks that he feels better off amiodarone.    On exam, abdomen is mildly distended (drank fluid for CT), mild diffuse tenderness.  3/6 HSM at apex.  JVP 7-8 cm. No edema.   It is possible  that amiodarone is causing his GI symptoms.  He does feel somewhat better off it.  Unfortunately, he is going to be at fairly high risk for recurrent aflutter with RVR with ongoing severe MR.   Will hold heparin gtt for now, need to rule out retroperitoneal hematoma with CT.   - CT abdomen just completed, will followup.  - Check LFTs.   Given some loose stool and rise in WBCs, also concern for C difficile (has had in past).  Unfortunately, stool sample has not yet been sent.    For now, continue current Lasix but follow creatinine closely.   Marca Ancona 10/08/2017 1:03 PM

## 2017-10-08 NOTE — Progress Notes (Signed)
Result of ct scan of abd. and pelvis  relayed to PA who in turn relay to MD. Continue to monitor.

## 2017-10-09 LAB — BASIC METABOLIC PANEL
Anion gap: 13 (ref 5–15)
BUN: 29 mg/dL — ABNORMAL HIGH (ref 6–20)
CO2: 23 mmol/L (ref 22–32)
Calcium: 8.5 mg/dL — ABNORMAL LOW (ref 8.9–10.3)
Chloride: 94 mmol/L — ABNORMAL LOW (ref 101–111)
Creatinine, Ser: 2.02 mg/dL — ABNORMAL HIGH (ref 0.61–1.24)
GFR calc non Af Amer: 36 mL/min — ABNORMAL LOW (ref >60)
GFR, EST AFRICAN AMERICAN: 42 mL/min — AB (ref >60)
GLUCOSE: 114 mg/dL — AB (ref 65–99)
Potassium: 3.9 mmol/L (ref 3.5–5.1)
Sodium: 130 mmol/L — ABNORMAL LOW (ref 135–145)

## 2017-10-09 LAB — CBC
HEMATOCRIT: 35.3 % — AB (ref 39.0–52.0)
HEMOGLOBIN: 11.5 g/dL — AB (ref 13.0–17.0)
MCH: 26.1 pg (ref 26.0–34.0)
MCHC: 32.6 g/dL (ref 30.0–36.0)
MCV: 80 fL (ref 78.0–100.0)
Platelets: 255 10*3/uL (ref 150–400)
RBC: 4.41 MIL/uL (ref 4.22–5.81)
RDW: 17.3 % — ABNORMAL HIGH (ref 11.5–15.5)
WBC: 25.1 10*3/uL — ABNORMAL HIGH (ref 4.0–10.5)

## 2017-10-09 NOTE — Progress Notes (Signed)
Physical Therapy Treatment Patient Details Name: Marcus [name] MRN: [id number] DOB: [birth year] Today's Date: 10/09/2017    History of Present Illness Marcus [name] is a 52 y.o. 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 Admitted forA/C diastolic HFwith elevated BNP and AKI. R heart cath 10/01/17    PT Comments    Session limited secondary to patient left knee pain. Session mainly focused on assessment of left knee and functional strength training. Patient has left knee septic arthritis and chronic pain at baseline, however, patient stating that pain is worse due to the "past 6 days of twisting and turning to get to the bathroom." Did note slight effusion and crepitus with knee extension. Patient reports pain is worse with weightbearing and prefers use of neoprene knee sleeve (from home) with mobility. Currently requiring min assist for sit to stand transfers from bed to RW; patient deferring ambulation. Patient wants family to bring in a "knee immobilizer" from home. Based on clinical judgment, patient could potentially benefit from use of a left knee immobilizer with ambulation for potential placebo effect to promote and increase mobility. Will recommend for next session.   Follow Up Recommendations  CIR;Supervision/Assistance - 24 hour     Equipment Recommendations  None recommended by PT    Recommendations for Other Services       Precautions / Restrictions Precautions Precautions: Fall;Other (comment)(enteric) Precaution Comments: has left neoprene knee brace from home Restrictions Weight Bearing Restrictions: No    Mobility  Bed Mobility Overal bed mobility: Modified Independent             General bed mobility comments: Modified independent with supine to and from sitting with  increased time   Transfers Overall transfer level: Needs assistance Equipment used: Rolling walker (2 wheeled) Transfers: Sit to/from Stand Sit to Stand: Min assist;+2 safety/equipment         General transfer comment: Requiring min assist to boost up from sitting EOB to standing with RW. Patient preferring to place both hands on the RW to transition from sit to stand with PT stabilizing RW.  Patient utilizing a narrow BOS. Once standing, patient with increased trunk flexion and left knee flexion secondary to pain.   Ambulation/Gait      Deferred by patient.           Stairs             Wheelchair Mobility    Modified Rankin (Stroke Patients Only)       Balance Overall balance assessment: Needs assistance Sitting-balance support: No upper extremity supported;Feet supported Sitting balance-Leahy Scale: Good     Standing balance support: Bilateral upper extremity supported Standing balance-Leahy Scale: Poor Standing balance comment: reliant on external support of RW                            Cognition Arousal/Alertness: Awake/alert Behavior During Therapy: WFL for tasks assessed/performed Overall Cognitive Status: Within Functional Limits for tasks assessed                                        Exercises General Exercises - Lower Extremity Long Arc Quad: 5 reps;Left;AAROM    General Comments General comments (skin integrity, edema,  etc.): VSS      Pertinent Vitals/Pain Pain Assessment: Faces Faces Pain Scale: Hurts even more Pain Location: left knee Pain Descriptors / Indicators: Discomfort;Grimacing;Guarding Pain Intervention(s): Limited activity within patient's tolerance;Monitored during session    Home Living                      Prior Function            PT Goals (current goals can now be found in the care plan section) Acute Rehab PT Goals Patient Stated Goal: to get as independent as possible and  work as hard as possible PT Goal Formulation: With patient Time For Goal Achievement: 10/16/17 Potential to Achieve Goals: Good    Frequency    Min 3X/week      PT Plan Current plan remains appropriate    Co-evaluation              AM-PAC PT "6 Clicks" Daily Activity  Outcome Measure  Difficulty turning over in bed (including adjusting bedclothes, sheets and blankets)?: A Little Difficulty moving from lying on back to sitting on the side of the bed? : A Little Difficulty sitting down on and standing up from a chair with arms (e.g., wheelchair, bedside commode, etc,.)?: Unable Help needed moving to and from a bed to chair (including a wheelchair)?: A Lot Help needed walking in hospital room?: A Lot Help needed climbing 3-5 steps with a railing? : Total 6 Click Score: 12    End of Session Equipment Utilized During Treatment: Gait belt Activity Tolerance: Patient limited by pain Patient left: in bed;with call bell/phone within reach Nurse Communication: Mobility status PT Visit Diagnosis: Unsteadiness on feet (R26.81);Difficulty in walking, not elsewhere classified (R26.2);Muscle weakness (generalized) (M62.81)     Time: 1610-9604 PT Time Calculation (min) (ACUTE ONLY): 22 min  Charges:  $Therapeutic Activity: 8-22 mins                    G Codes:       Laurina Bustle, PT, DPT Acute Rehabilitation Services  Pager: [phone]    Vanetta Mulders 10/09/2017, 4:51 PM

## 2017-10-09 NOTE — Progress Notes (Signed)
OT Cancellation Note  Patient Details Name: KARVER FADDEN MRN: [id number] DOB: 18-Feb-1966   Cancelled Treatment:    Reason Eval/Treat Not Completed: Other (comment). Pt reports he is just too weak from C-diff and being up to bedside commode 4 times last night in addition to back and abdominal pain. Will cancel for today and try back at later date as schedule allows. Encouraged pt to work with PT when then come since pt is asking about a knee brace.  Evette Georges [phone] 10/09/2017, 8:23 AM

## 2017-10-09 NOTE — Progress Notes (Addendum)
Patient ID: Marcus [name], male   DOB: Mar 21, 1966, 52 y.o.   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 + Diffuse colonic wall thickening consistent with colitis.   + C-Diff culture 10/08/17. Started on Vancomycin.   WBC 12 > 14 > 16 < 25.1.  Creatinine 1.7 -> 2.02  Fatigued. Having 4-5 loose stools daily. Tmax 102.4.   RHC 10/01/17 RHC Procedural Findings: Hemodynamics (mmHg) RA mean 11 RV 47/15 PA 49/25, mean 38 PCWP mean 19, v-waves to 35 Oxygen saturations: PA 52% AO 94% Cardiac Output (Fick) 4.35  Cardiac Index (Fick) 2.35  PVR 4.4 WU  Objective:   Weight Range: 147 lb 4.3 oz (66.8 kg) Body mass index is 20.54 kg/m.   Vital Signs:   Temp:  [97.8 F (36.6 C)-102.4 F (39.1 C)] 98.7 F (37.1 C) (05/09 0824) Pulse Rate:  [92-103] 98 (05/09 0824) Resp:  [13-22] 17 (05/09 0824) BP: (85-106)/(61-80) 96/65 (05/09 0824) SpO2:  [90 %-98 %] 97 % (05/09 0824) Weight:  [147 lb 4.3 oz (66.8 kg)] 147 lb 4.3 oz (66.8 kg) (05/09 0500) Last BM Date: 10/08/17  Weight change: Filed Weights   10/07/17 0520 10/08/17 0300 10/09/17 0500  Weight: 151 lb 9.6 oz (68.8 kg) 149 lb 8 oz (67.8 kg) 147 lb 4.3 oz (66.8 kg)    Intake/Output:   Intake/Output Summary (Last 24 hours) at 10/09/2017 1009 Last data filed at 10/09/2017 0021 Gross per 24 hour  Intake 544.75 ml  Output 350 ml  Net 194.75 ml      Physical Exam   General: Fatigued. No resp difficulty. HEENT: Normal Neck: Supple. JVP 6-7. Carotids 2+ bilat; no bruits. No thyromegaly or nodule noted. Cor: PMI nondisplaced. RRR, No rubs or gallops. 3/6 HSM.  Lungs: CTAB, normal effort. Abdomen: Soft, non-tender, ++ distended, no HSM. No bruits or masses. +BS  Extremities: No cyanosis, clubbing, or rash. R and LLE no  edema.  Neuro: Alert & orientedx3, cranial nerves grossly intact. moves all 4 extremities w/o difficulty. Affect flat  Telemetry   NSR 80s, personally reviewed.   Labs    CBC Recent Labs    10/08/17 0520 10/09/17 0740  WBC 16.9* 25.1*  HGB 10.2* 11.5*  HCT 32.2* 35.3*  MCV 80.9 80.0  PLT 300 255   Basic Metabolic Panel Recent Labs    11/91/47 0520 10/09/17 0740  NA 131* 130*  K 3.3* 3.9  CL 96* 94*  CO2 25 23  GLUCOSE 126* 114*  BUN 30* 29*  CREATININE 1.76* 2.02*  CALCIUM 8.3* 8.5*   Liver Function Tests Recent Labs    10/07/17 0302 10/08/17 0520  AST 38 34  ALT 49 43  ALKPHOS 98 93  BILITOT 0.5 0.9  PROT 7.1 6.7  ALBUMIN 2.8* 2.5*   No results for input(s): LIPASE, AMYLASE in the last 72 hours. Cardiac Enzymes No results for input(s): CKTOTAL, CKMB, CKMBINDEX, TROPONINI in the last 72 hours.  BNP: BNP (last 3 results) Recent Labs    08/09/17 1607 09/30/17 1500  BNP 449.0* 706.9*    ProBNP (last 3 results) Recent Labs    09/29/17 1350  PROBNP 13,809*     D-Dimer No results for input(s): DDIMER in the last 72 hours. Hemoglobin A1C Recent Labs    10/08/17 0520  HGBA1C  5.4   Fasting Lipid Panel No results for input(s): CHOL, HDL, LDLCALC, TRIG, CHOLHDL, LDLDIRECT in the last 72 hours. Thyroid Function Tests No results for input(s): TSH, T4TOTAL, T3FREE, THYROIDAB in the last 72 hours.  Invalid input(s): FREET3  Other results:   Imaging    Ct Angio Abd/pel W/ And/or W/o  Result Date: 10/08/2017 CLINICAL DATA:  52 year old with generalized abdominal pain and fever. History of endocarditis. EXAM: CT ANGIOGRAPHY ABDOMEN AND PELVIS WITH CONTRAST CONTRAST TECHNIQUE: Multidetector CT imaging of the abdomen and pelvis was performed using the standard protocol during bolus administration of intravenous contrast. Multiplanar reconstructed images and MIPs were obtained and reviewed to evaluate the vascular anatomy. CONTRAST:  80mL ISOVUE-370  IOPAMIDOL (ISOVUE-370) INJECTION 76% COMPARISON:  Chest CT 08/09/2017 FINDINGS: VASCULAR Aorta: Normal caliber of the abdominal aorta without dissection. Minimal atherosclerotic disease. Celiac: Patent without evidence of aneurysm, dissection, vasculitis or significant stenosis. SMA: Patent without evidence of aneurysm, dissection, vasculitis or significant stenosis. Renals: Both renal arteries are patent without evidence of aneurysm, dissection, vasculitis, fibromuscular dysplasia or significant stenosis. Small accessory right renal artery. IMA: Patent without evidence of aneurysm, dissection, vasculitis or significant stenosis. Inflow: Atherosclerotic disease in the iliac arteries without significant stenosis. The internal and external iliac arteries are patent. Proximal Outflow: Atherosclerotic disease in the proximal femoral arteries without significant stenosis. However, there are 2 saccular aneurysms involving proximal branches of the left profunda femoral arteries. The largest left profunda femoral artery aneurysm measures 0.9 cm. Veins: No obvious venous abnormalities. Review of the MIP images confirms the above findings. NON-VASCULAR Lower chest: Again noted are bilateral pleural effusions with compressive atelectasis in both lower lobes. Visualized pulmonary arteries are patent. Scarring or volume loss at the right middle lobe base. Hepatobiliary: Small amount of perihepatic ascites. Gallbladder is mildly distended. Portal venous system appears to be patent. Pancreas: Normal appearance of the pancreas without inflammation or duct dilatation. Spleen: Normal appearance of spleen without enlargement. Adrenals/Urinary Tract: Normal adrenal glands. Normal appearance of both kidneys without hydronephrosis. Mild distention of the urinary bladder. Stomach/Bowel: Wall thickening in the rectum and sigmoid colon with mild distention. Mild wall thickening involving the transverse colon and descending colon. Probable  wall thickening at the cecum. Stomach is decompressed and unremarkable. Normal appearance of small bowel without focal wall thickening or distention. Appendix may be gas-filled. There is pericolonic edema particularly along the descending colon and near the sigmoid colon. Mild mesenteric edema. Lymphatic: No significant lymph node enlargement in the abdomen or pelvis. Reproductive: Prostate is unremarkable few calcifications. Other: Mesenteric edema and a small amount of ascites in lower abdomen. Small amount of ascites in the left lower quadrant. Musculoskeletal: Extensive facet disease in the lower lumbar spine. Surgical hardware along the left side of the pelvis. Both hips are located. Degenerative changes in left hip joint. IMPRESSION: VASCULAR Main visceral arteries are patent without significant stenosis. 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 Consider non emergent Vascular Surgery consultation for surveillance or management. NON-VASCULAR Diffuse colonic wall thickening is suggestive for colitis. The most significant pericolonic edema involves the descending colon. Small amount of ascites with some mesenteric edema. Etiology for the colitis is uncertain. Bilateral pleural effusions. These results will be called to the ordering clinician or representative by the Radiologist Assistant, and communication documented in the PACS or zVision Dashboard. Electronically Signed   By: Richarda Overlie M.D.   On: 10/08/2017 15:20     Medications:  Scheduled Medications: . chlorhexidine  60 mL Topical Once  . gabapentin  300 mg Oral TID  . potassium chloride  40 mEq Oral Daily  . sodium chloride flush  3 mL Intravenous Q12H  . sodium chloride flush  3 mL Intravenous Q12H  . thiamine  100 mg Oral Daily  . vancomycin  125 mg Oral QID   Followed by  . [START ON 10/22/2017] vancomycin  125 mg Oral BID   Followed by  . [START ON 10/30/2017] vancomycin  125 mg  Oral Daily   Followed by  . [START ON 11/06/2017] vancomycin  125 mg Oral QODAY   Followed by  . [START ON 11/14/2017] vancomycin  125 mg Oral Q3 days    Infusions: . sodium chloride    . sodium chloride      PRN Medications: sodium chloride, sodium chloride, acetaminophen, iopamidol, ondansetron (ZOFRAN) IV, oxyCODONE, sodium chloride flush, sodium chloride flush   Patient Profile   Marcus [name] is a 52 y.o. 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   Assessment/Plan   1. Acute diastolic CHF: Due to severe mitral regurgitation from perforated anterior leaflet.  He was admitted markedly volume overloaded on exam.  Symptoms were baseline, NYHA class III, likely due to a combination of deconditioning and CHF.  Echo showed that LV EF remains normal at 55-60%.  However, RV has mild to moderate systolic dysfunction. RHC 10/01/17 mildly elevated PCWP and pulmonary venous hypertension. Relatively preserved CO.   -Volume status stable - Creatinine 1.7 -> 2.0. Hold lasix. 2. Severe mitral regurgitation: Due to prior endocarditis with MSSA. 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  - Soft BPs. No room for afterload reduction yet.  - Volume status improved.  - MVR on hold with C-Diff. 3. Aflutter with RVR: - Maintaining NSR. -  Off amio and heparin.   4. 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  He has had a full course of antibiotic treatment and PICC is now out.  - Tmax 102.4. Now on po vanc.   - + Cdiff 10/08/17. Started on oral vanc.  -WBC 12.6 >14.6>16.9 > 25.1 - Blood cultures NGTD.  5. H/o septic arthritis: Left  knee.  This significantly limits his mobility.  - PT/OT consults.  - No change to current plan.   6. Right pleural effusion: Moderate on CXR this admission.  Chronic, likely due to CHF.  - Improved 7. Polysubstance abuse:  Prior use of opioids. Encouraged complete cessation.  - No change to current plan.   8. AKI on CKD: Stage 3.  Creatinine 1.76 -> 2.02. Hold lasix.  9. Diarrhea: He had C difficile back in March.   - Positive for C-Diff 10/08/17 - Now on po vanc.  - WBC trending up.   Surgery deferred in setting of abdominal pain and infection. Now with C-Diff. Hold lasix with AKI.   Length of Stay: [address]  Graciella Freer, [place]  10/09/2017, 10:09 AM  Advanced Heart Failure Team Pager [phone] (M-F; 7a - 4p)  Please contact [facility] for night-coverage after hours (4p -7a ) and weekends on [email or web address]  Patient seen with PA.  WBCs up,  stool sample + for C difficile.  Feels weak.  Tmax 102.  He continues to have diarrhea.  - Hold Lasix with poor po intake, diarrhea, and rise in creatinine.  - Continue po vancomycin, will give 6 wk taper (discussed with Dr. Ninetta Lights).   Will need to clinically improve prior to MV repair.   Marca Ancona 10/09/2017 10:34 AM

## 2017-10-10 LAB — BASIC METABOLIC PANEL
Anion gap: 11 (ref 5–15)
BUN: 32 mg/dL — AB (ref 6–20)
CALCIUM: 8.2 mg/dL — AB (ref 8.9–10.3)
CO2: 23 mmol/L (ref 22–32)
CREATININE: 2.05 mg/dL — AB (ref 0.61–1.24)
Chloride: 95 mmol/L — ABNORMAL LOW (ref 101–111)
GFR calc Af Amer: 41 mL/min — ABNORMAL LOW (ref >60)
GFR, EST NON AFRICAN AMERICAN: 36 mL/min — AB (ref >60)
GLUCOSE: 110 mg/dL — AB (ref 65–99)
POTASSIUM: 3.5 mmol/L (ref 3.5–5.1)
SODIUM: 129 mmol/L — AB (ref 135–145)

## 2017-10-10 LAB — CBC
HCT: 29.4 % — ABNORMAL LOW (ref 39.0–52.0)
Hemoglobin: 9.3 g/dL — ABNORMAL LOW (ref 13.0–17.0)
MCH: 25.1 pg — AB (ref 26.0–34.0)
MCHC: 31.6 g/dL (ref 30.0–36.0)
MCV: 79.5 fL (ref 78.0–100.0)
PLATELETS: 274 10*3/uL (ref 150–400)
RBC: 3.7 MIL/uL — ABNORMAL LOW (ref 4.22–5.81)
RDW: 17.2 % — AB (ref 11.5–15.5)
WBC: 24.6 10*3/uL — ABNORMAL HIGH (ref 4.0–10.5)

## 2017-10-10 NOTE — Progress Notes (Signed)
OT Cancellation Note  Patient Details Name: Marcus [name] MRN: [id number] DOB: July 11, 1965   Cancelled Treatment:    Reason Eval/Treat Not Completed: Other (comment). Pt sitting on EOB upon arrival with KI on. Says he cannot do therapy now due to he is too tired from all the times he has been up to the bathroom due to c-diff. Explained the importance of doing therapy to get stronger and to see if the KI is making his ambulation better. He politely declined.  Evette Georges [phone] 10/10/2017, 1:22 PM

## 2017-10-10 NOTE — Progress Notes (Signed)
PT Cancellation Note  Patient Details Name: Marcus [name] MRN: [id number] DOB: 1965-09-04   Cancelled Treatment:    Reason Eval/Treat Not Completed: Patient declined, no reason specified(pt refused stating he had been up all night toileting, refused to get to chair or perform any HEP. Pt continues to request KI and notified Amy Clegg. Pt reports he won't move until KI present)   Page Lancon B Lanesha Azzaro 10/10/2017, 8:14 AM Delaney Meigs, PT [phone]

## 2017-10-10 NOTE — Progress Notes (Signed)
RN reinforced importance of nutrition and sticking to a heart healthy diet as patient's family has been bringing in outside food for patient including [employer] and [employer].

## 2017-10-10 NOTE — Progress Notes (Signed)
Orthopedic Tech Progress Note Patient Details:  MANDEL SEIDEN 1966/01/31 [id number]  Ortho Devices Type of Ortho Device: Knee Immobilizer Ortho Device/Splint Interventions: Application   Post Interventions Patient Tolerated: Well Instructions Provided: Care of device   Saul Fordyce 10/10/2017, 10:36 AM

## 2017-10-10 NOTE — Progress Notes (Signed)
[address]       Marcus [name] [id number]             [phone]     CARDIOTHORACIC SURGERY PROGRESS NOTE  Subjective: No appetite.  Abdominal discomfort persistes  Objective: Vital signs in last 24 hours: Temp:  [98.1 F (36.7 C)-98.6 F (37 C)] 98.5 F (36.9 C) (05/10 0200) Pulse Rate:  [87-93] 89 (05/10 0530) Cardiac Rhythm: Normal sinus rhythm (05/10 0752) Resp:  [15-26] 26 (05/10 0530) BP: (79-93)/(53-64) 93/64 (05/10 0530) SpO2:  [89 %-100 %] 100 % (05/10 0530) Weight:  [148 lb 9.4 oz (67.4 kg)] 148 lb 9.4 oz (67.4 kg) (05/10 0549)  Physical Exam:  Rhythm:   sinus  Breath sounds: Diminished right base  Heart sounds:  RRR w/ holosystolic murmur  Incisions:  Na/  Abdomen:  Soft, mildly distended and tender  Extremities:  Warm, well-perfused, trace edema   Intake/Output from previous day: 05/09 0701 - 05/10 0700 In: 3 [I.V.:3] Out: -  Intake/Output this shift: No intake/output data recorded.  Lab Results: Recent Labs    10/08/17 0520 10/09/17 0740  WBC 16.9* 25.1*  HGB 10.2* 11.5*  HCT 32.2* 35.3*  PLT 300 255   BMET:  Recent Labs    10/08/17 0520 10/09/17 0740  NA 131* 130*  K 3.3* 3.9  CL 96* 94*  CO2 25 23  GLUCOSE 126* 114*  BUN 30* 29*  CREATININE 1.76* 2.02*  CALCIUM 8.3* 8.5*    CBG (last 3)  No results for input(s): GLUCAP in the last 72 hours. PT/INR:  No results for input(s): LABPROT, INR in the last 72 hours.  CXR:  N/A CT ANGIOGRAPHY ABDOMEN AND PELVIS WITH CONTRAST CONTRAST  TECHNIQUE: Multidetector CT imaging of the abdomen and pelvis was performed using the standard protocol during bolus administration of intravenous contrast. Multiplanar reconstructed images and MIPs were obtained and reviewed to evaluate the vascular anatomy.  CONTRAST:  80mL ISOVUE-370 IOPAMIDOL (ISOVUE-370) INJECTION 76%  COMPARISON:  Chest CT 08/09/2017  FINDINGS: VASCULAR  Aorta: Normal caliber of the abdominal aorta  without dissection. Minimal atherosclerotic disease.  Celiac: Patent without evidence of aneurysm, dissection, vasculitis or significant stenosis.  SMA: Patent without evidence of aneurysm, dissection, vasculitis or significant stenosis.  Renals: Both renal arteries are patent without evidence of aneurysm, dissection, vasculitis, fibromuscular dysplasia or significant stenosis. Small accessory right renal artery.  IMA: Patent without evidence of aneurysm, dissection, vasculitis or significant stenosis.  Inflow: Atherosclerotic disease in the iliac arteries without significant stenosis. The internal and external iliac arteries are patent.  Proximal Outflow: Atherosclerotic disease in the proximal femoral arteries without significant stenosis. However, there are 2 saccular aneurysms involving proximal branches of the left profunda femoral arteries. The largest left profunda femoral artery aneurysm measures 0.9 cm.  Veins: No obvious venous abnormalities.  Review of the MIP images confirms the above findings.  NON-VASCULAR  Lower chest: Again noted are bilateral pleural effusions with compressive atelectasis in both lower lobes. Visualized pulmonary arteries are patent. Scarring or volume loss at the right middle lobe base.  Hepatobiliary: Small amount of perihepatic ascites. Gallbladder is mildly distended. Portal venous system appears to be patent.  Pancreas: Normal appearance of the pancreas without inflammation or duct dilatation.  Spleen: Normal appearance of spleen without enlargement.  Adrenals/Urinary Tract: Normal adrenal glands. Normal appearance of both kidneys without hydronephrosis. Mild distention of the urinary bladder.  Stomach/Bowel: Wall thickening in the rectum and sigmoid colon with  mild distention. Mild wall thickening involving the transverse colon and descending colon. Probable wall thickening at the cecum. Stomach is decompressed  and unremarkable. Normal appearance of small bowel without focal wall thickening or distention. Appendix may be gas-filled. There is pericolonic edema particularly along the descending colon and near the sigmoid colon. Mild mesenteric edema.  Lymphatic: No significant lymph node enlargement in the abdomen or pelvis.  Reproductive: Prostate is unremarkable few calcifications.  Other: Mesenteric edema and a small amount of ascites in lower abdomen. Small amount of ascites in the left lower quadrant.  Musculoskeletal: Extensive facet disease in the lower lumbar spine. Surgical hardware along the left side of the pelvis. Both hips are located. Degenerative changes in left hip joint.  IMPRESSION: VASCULAR  Main visceral arteries are patent without significant stenosis.  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 Consider non emergent Vascular Surgery consultation for surveillance or management.  NON-VASCULAR  Diffuse colonic wall thickening is suggestive for colitis. The most significant pericolonic edema involves the descending colon. Small amount of ascites with some mesenteric edema. Etiology for the colitis is uncertain.  Bilateral pleural effusions.  These results will be called to the ordering clinician or representative by the Radiologist Assistant, and communication documented in the PACS or zVision Dashboard.   Electronically Signed   By: Richarda Overlie M.D.   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  WBC still rising.  Abdomen remains mildly tender but no peritoneal signs.  Patient will not be a candidate for mitral valve repair/replacement until colitis has fully resolved.  Will continue to follow intermittently but please call if specific questions arise.  I spent in excess of 15 minutes during the conduct of this hospital encounter and >50% of this  time involved direct face-to-face encounter with the patient for counseling and/or coordination of their care.    Purcell Nails, MD 10/10/2017 8:46 AM

## 2017-10-10 NOTE — Progress Notes (Addendum)
Patient ID: Marcus [name], male   DOB: 1965-06-04, 52 y.o.   MRN: [id number]     Advanced Heart Failure Rounding Note  PCP-Cardiologist: Armanda Magic, MD  HF Cardiology: Shirlee Latch  Subjective:    Lasix on hold with AKI. On Vanc for C diff. Completing 6 weeks of vancomycin.   2-3 BMS last night. Ongoing abdominal pain. Says he is not hungry.    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 + Diffuse colonic wall thickening consistent with colitis.   + C-Diff culture 10/08/17. Started on Vancomycin.   Marland Kitchen  RHC 10/01/17 RHC Procedural Findings: Hemodynamics (mmHg) RA mean 11 RV 47/15 PA 49/25, mean 38 PCWP mean 19, v-waves to 35 Oxygen saturations: PA 52% AO 94% Cardiac Output (Fick) 4.35  Cardiac Index (Fick) 2.35  PVR 4.4 WU  Objective:   Weight Range: 148 lb 9.4 oz (67.4 kg) Body mass index is 20.72 kg/m.   Vital Signs:   Temp:  [98.1 F (36.7 C)-98.7 F (37.1 C)] 98.5 F (36.9 C) (05/10 0200) Pulse Rate:  [87-98] 89 (05/10 0530) Resp:  [15-26] 26 (05/10 0530) BP: (79-96)/(53-65) 93/64 (05/10 0530) SpO2:  [89 %-100 %] 100 % (05/10 0530) Weight:  [148 lb 9.4 oz (67.4 kg)] 148 lb 9.4 oz (67.4 kg) (05/10 0549) Last BM Date: 10/09/17  Weight change: Filed Weights   10/08/17 0300 10/09/17 0500 10/10/17 0549  Weight: 149 lb 8 oz (67.8 kg) 147 lb 4.3 oz (66.8 kg) 148 lb 9.4 oz (67.4 kg)    Intake/Output:   Intake/Output Summary (Last 24 hours) at 10/10/2017 0723 Last data filed at 10/09/2017 2300 Gross per 24 hour  Intake 3 ml  Output -  Net 3 ml      Physical Exam  General:  No resp difficulty. In bed.  HEENT: normal Neck: supple. no JVD. Carotids 2+ bilat; no bruits. No lymphadenopathy or thryomegaly appreciated. Cor: PMI nondisplaced. Regular rate & rhythm. No rubs, gallops. 3/6 HSM . Lungs: clear Abdomen: soft, tender, +distended. No hepatosplenomegaly. No bruits or masses. Good bowel  sounds. Extremities: no cyanosis, clubbing, rash, R and LLE ted hose.  Neuro: alert & orientedx3, cranial nerves grossly intact. moves all 4 extremities w/o difficulty. Affect flat.     Telemetry   NSR 80-90s personally reviewed.   Labs    CBC Recent Labs    10/08/17 0520 10/09/17 0740  WBC 16.9* 25.1*  HGB 10.2* 11.5*  HCT 32.2* 35.3*  MCV 80.9 80.0  PLT 300 255   Basic Metabolic Panel Recent Labs    40/98/11 0520 10/09/17 0740  NA 131* 130*  K 3.3* 3.9  CL 96* 94*  CO2 25 23  GLUCOSE 126* 114*  BUN 30* 29*  CREATININE 1.76* 2.02*  CALCIUM 8.3* 8.5*   Liver Function Tests Recent Labs    10/08/17 0520  AST 34  ALT 43  ALKPHOS 93  BILITOT 0.9  PROT 6.7  ALBUMIN 2.5*   No results for input(s): LIPASE, AMYLASE in the last 72 hours. Cardiac Enzymes No results for input(s): CKTOTAL, CKMB, CKMBINDEX, TROPONINI in the last 72 hours.  BNP: BNP (last 3 results) Recent Labs    08/09/17 1607 09/30/17 1500  BNP 449.0* 706.9*    ProBNP (last 3 results) Recent Labs    09/29/17 1350  PROBNP 13,809*     D-Dimer No results for input(s): DDIMER in the last 72 hours. Hemoglobin A1C Recent Labs  10/08/17 0520  HGBA1C 5.4   Fasting Lipid Panel No results for input(s): CHOL, HDL, LDLCALC, TRIG, CHOLHDL, LDLDIRECT in the last 72 hours. Thyroid Function Tests No results for input(s): TSH, T4TOTAL, T3FREE, THYROIDAB in the last 72 hours.  Invalid input(s): FREET3  Other results:   Imaging    No results found.   Medications:     Scheduled Medications: . chlorhexidine  60 mL Topical Once  . gabapentin  300 mg Oral TID  . potassium chloride  40 mEq Oral Daily  . sodium chloride flush  3 mL Intravenous Q12H  . sodium chloride flush  3 mL Intravenous Q12H  . thiamine  100 mg Oral Daily  . vancomycin  125 mg Oral QID   Followed by  . [START ON 10/22/2017] vancomycin  125 mg Oral BID   Followed by  . [START ON 10/30/2017] vancomycin  125 mg  Oral Daily   Followed by  . [START ON 11/06/2017] vancomycin  125 mg Oral QODAY   Followed by  . [START ON 11/14/2017] vancomycin  125 mg Oral Q3 days    Infusions: . sodium chloride    . sodium chloride      PRN Medications: sodium chloride, sodium chloride, acetaminophen, iopamidol, ondansetron (ZOFRAN) IV, oxyCODONE, sodium chloride flush, sodium chloride flush   Patient Profile   Marcus [name] is a 52 y.o. 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   Assessment/Plan   1. Acute diastolic CHF: Due to severe mitral regurgitation from perforated anterior leaflet.  He was admitted markedly volume overloaded on exam.  Symptoms were baseline, NYHA class III, likely due to a combination of deconditioning and CHF.  Echo showed that LV EF remains normal at 55-60%.  However, RV has mild to moderate systolic dysfunction. RHC 10/01/17 mildly elevated PCWP and pulmonary venous hypertension. Relatively preserved CO.   -Volume status low  - Creatinine 1.7 -> 2.0-> pending.  2. Severe mitral regurgitation: Due to prior endocarditis with MSSA. 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  - Appears dry.  - BP low. No room for afterload.   - MVR on hold with C-Diff. 3. Aflutter with RVR: - Maintaining NSR. -  Off amio and heparin.   4. 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  He has had a full course of antibiotic treatment and PICC is now out.  - Afebrile. On po vanc for 6 weeks.    - + Cdiff 10/08/17. Started on oral vanc.  -WBC 12.6 >14.6>16.9 > 25.1>pending.  - Blood cultures NGTD.  5. H/o septic arthritis: Left knee.  This  significantly limits his mobility.  -PT following.  - No change to current plan.   6. Right pleural effusion: Moderate on CXR this admission.  Chronic, likely due to CHF.  - Improved 7. Polysubstance abuse:  Prior use of opioids. Encouraged complete cessation.  - No change to current plan.   8. AKI on CKD: Stage 3.  Continue to hold lasix. BMET pending.   9. Diarrhea: He had C difficile back in March.   - Positive for C-Diff 10/08/17 - Now on po vanc with taper per ID.   -  CBC pending.   Surgery on hold.  SBP low. Poor Po intake. Consult dietitian. May need IVF but he has been drinking gatorade.  Discussed with PT at bedside. Mobility limited. Will add immobilizer per PT/patient request. May be ok for CIR next week.   Length of Stay: 10  Tonye Becket, NP  10/10/2017, 7:23 AM  Advanced Heart Failure Team Pager [phone] (M-F; 7a - 4p)  Please contact [facility] for night-coverage after hours (4p -7a ) and weekends on [email or web address]  Patient seen with NP, agree with the above note.  Still with loose stool and abdominal discomfort.  Stable WBCs, now on po vancomycin.    Lasix on hold with AKI, will need BMET today.   Surgery on hold.  He likely will be ready for CIR next week.  When he has completely recovered from colitis, will need mitral valve surgery.   Marca Ancona 10/10/2017 10:08 AM

## 2017-10-11 LAB — CBC
HEMATOCRIT: 35.2 % — AB (ref 39.0–52.0)
Hemoglobin: 11.1 g/dL — ABNORMAL LOW (ref 13.0–17.0)
MCH: 25.6 pg — ABNORMAL LOW (ref 26.0–34.0)
MCHC: 31.5 g/dL (ref 30.0–36.0)
MCV: 81.3 fL (ref 78.0–100.0)
PLATELETS: 165 10*3/uL (ref 150–400)
RBC: 4.33 MIL/uL (ref 4.22–5.81)
RDW: 17.5 % — AB (ref 11.5–15.5)
WBC: 23.8 10*3/uL — ABNORMAL HIGH (ref 4.0–10.5)

## 2017-10-11 LAB — BASIC METABOLIC PANEL
Anion gap: 11 (ref 5–15)
BUN: 28 mg/dL — ABNORMAL HIGH (ref 6–20)
CALCIUM: 8.4 mg/dL — AB (ref 8.9–10.3)
CO2: 22 mmol/L (ref 22–32)
CREATININE: 1.74 mg/dL — AB (ref 0.61–1.24)
Chloride: 94 mmol/L — ABNORMAL LOW (ref 101–111)
GFR calc Af Amer: 50 mL/min — ABNORMAL LOW (ref >60)
GFR, EST NON AFRICAN AMERICAN: 43 mL/min — AB (ref >60)
Glucose, Bld: 119 mg/dL — ABNORMAL HIGH (ref 65–99)
Potassium: 4 mmol/L (ref 3.5–5.1)
Sodium: 127 mmol/L — ABNORMAL LOW (ref 135–145)

## 2017-10-11 NOTE — Progress Notes (Signed)
Progress Note  Patient Name: Marcus [name] Date of Encounter: 10/11/2017  Primary Cardiologist: Armanda Magic, MD   Subjective   52 year old gentleman with recent admission for acute diastolic heart failure due to mitral regurgitation from a perforated anterior leaflet.  History of CVA from septic emboli.  He had septic arthritis and pneumonia.  He had a full course of antibiotic therapy.  Now on oral vancomycin for [name] colitis.  His diarrhea is gradually getting better.  Seen by Dr. Cornelius Moras .  MV repair surgery  is currently on hold because of his overall poor condition.    Inpatient Medications    Scheduled Meds: . chlorhexidine  60 mL Topical Once  . gabapentin  300 mg Oral TID  . potassium chloride  40 mEq Oral Daily  . sodium chloride flush  3 mL Intravenous Q12H  . sodium chloride flush  3 mL Intravenous Q12H  . thiamine  100 mg Oral Daily  . vancomycin  125 mg Oral QID   Followed by  . [START ON 10/22/2017] vancomycin  125 mg Oral BID   Followed by  . [START ON 10/30/2017] vancomycin  125 mg Oral Daily   Followed by  . [START ON 11/06/2017] vancomycin  125 mg Oral QODAY   Followed by  . [START ON 11/14/2017] vancomycin  125 mg Oral Q3 days   Continuous Infusions: . sodium chloride    . sodium chloride     PRN Meds: sodium chloride, sodium chloride, acetaminophen, iopamidol, ondansetron (ZOFRAN) IV, oxyCODONE, sodium chloride flush, sodium chloride flush   Vital Signs    Vitals:   10/11/17 0025 10/11/17 0434 10/11/17 0500 10/11/17 0801  BP: 91/65 (!) 85/61  (!) 86/62  Pulse: 85 80  84  Resp: Temp:  97.7 F (36.5 C)  98.1 F (36.7 C)  TempSrc:  Oral  Oral  SpO2: 91% 99%  92%  Weight:   149 lb 12.8 oz (67.9 kg)   Height:       No intake or output data in the 24 hours ending 10/11/17 0917 Filed Weights   10/09/17 0500 10/10/17 0549 10/11/17 0500  Weight: 147 lb 4.3 oz (66.8 kg) 148 lb 9.4 oz (67.4 kg) 149 lb 12.8 oz (67.9 kg)     Telemetry    NSR  - Personally Reviewed  ECG     NSR  - Personally Reviewed  Physical Exam   GEN: Middle-aged gentleman, no acute distress Neck: No JVD Cardiac:  Rate S1-S2.  He has a 2/6 to 3/6 systolic murmur radiating to the axilla. Respiratory: Clear to auscultation bilaterally. GI: Soft, nontender, non-distended  MS: No edema; No deformity. Neuro:  Nonfocal  Psych: Normal affect   Labs    Chemistry Recent Labs  Lab 10/07/17 0302 10/08/17 0520 10/09/17 0740 10/10/17 1007 10/11/17 0252  NA 130* 131* 130* 129* 127*  K 3.8 3.3* 3.9 3.5 4.0  CL 94* 96* 94* 95* 94*  CO2 GLUCOSE 111* 126* 114* 110* 119*  BUN 33* 30* 29* 32* 28*  CREATININE 1.62* 1.76* 2.02* 2.05* 1.74*  CALCIUM 8.5* 8.3* 8.5* 8.2* 8.4*  PROT 7.1 6.7  --   --   --   ALBUMIN 2.8* 2.5*  --   --   --   AST 38 34  --   --   --   ALT 49 43  --   --   --   ALKPHOS  98 93  --   --   --   BILITOT 0.5 0.9  --   --   --   GFRNONAA 47* 43* 36* 36* 43*  GFRAA 55* 50* 42* 41* 50*  ANIONGAP Hematology Recent Labs  Lab 10/09/17 0740 10/10/17 0731 10/11/17 0252  WBC 25.1* 24.6* 23.8*  RBC 4.41 3.70* 4.33  HGB 11.5* 9.3* 11.1*  HCT 35.3* 29.4* 35.2*  MCV 80.0 79.5 81.3  MCH 26.1 25.1* 25.6*  MCHC 32.6 31.6 31.5  RDW 17.3* 17.2* 17.5*  PLT 255 274 165    Cardiac EnzymesNo results for input(s): TROPONINI in the last 168 hours. No results for input(s): TROPIPOC in the last 168 hours.   BNPNo results for input(s): BNP, PROBNP in the last 168 hours.   DDimer No results for input(s): DDIMER in the last 168 hours.   Radiology    No results found.  Cardiac Studies     Patient Profile     52 y.o. male with history of polysubstance abuse including IV drug abuse.  He was admitted with acute diastolic congestive heart failure related to perforated mitral valve which was due to endocarditis. His course is also been complicated by an embolic stroke, septic  arthritis of the left knee, hepatitis C pleural effusions.  Assessment & Plan    1.  Acute acute diastolic congestive heart failure.  He seems to be stable.  Lungs are clear. Continue supportive care.  .  [name] colitis: Continue oral vancomycin.  He still very weak.  He is not able to keep food down very well.  He may need inpatient rehab.  3.  Perforated mitral valve: Patient has a perforated mitral valve related to endocarditis.  He had a full course of antibiotic therapy.  He has been seen by Dr. Cornelius Moras .  Mitral Valve repair has been postponed until he is clinically improved from all of his other medical issues.  For questions or updates, please contact [facility] Please consult [email or web address] for contact info under Cardiology/STEMI.      Signed, Kristeen Miss, MD  10/11/2017, 9:17 AM

## 2017-10-12 DIAGNOSIS — A0472 Enterocolitis due to Clostridium difficile, not specified as recurrent: Secondary | ICD-10-CM

## 2017-10-12 LAB — CBC
HCT: 29.4 % — ABNORMAL LOW (ref 39.0–52.0)
Hemoglobin: 9.4 g/dL — ABNORMAL LOW (ref 13.0–17.0)
MCH: 25.2 pg — AB (ref 26.0–34.0)
MCHC: 32 g/dL (ref 30.0–36.0)
MCV: 78.8 fL (ref 78.0–100.0)
PLATELETS: 283 10*3/uL (ref 150–400)
RBC: 3.73 MIL/uL — ABNORMAL LOW (ref 4.22–5.81)
RDW: 17.3 % — ABNORMAL HIGH (ref 11.5–15.5)
WBC: 20.8 10*3/uL — ABNORMAL HIGH (ref 4.0–10.5)

## 2017-10-12 LAB — BASIC METABOLIC PANEL
Anion gap: 9 (ref 5–15)
BUN: 22 mg/dL — ABNORMAL HIGH (ref 6–20)
CHLORIDE: 95 mmol/L — AB (ref 101–111)
CO2: 23 mmol/L (ref 22–32)
Calcium: 8.1 mg/dL — ABNORMAL LOW (ref 8.9–10.3)
Creatinine, Ser: 1.43 mg/dL — ABNORMAL HIGH (ref 0.61–1.24)
GFR calc non Af Amer: 55 mL/min — ABNORMAL LOW (ref >60)
Glucose, Bld: 105 mg/dL — ABNORMAL HIGH (ref 65–99)
POTASSIUM: 3.8 mmol/L (ref 3.5–5.1)
SODIUM: 127 mmol/L — AB (ref 135–145)

## 2017-10-12 NOTE — Progress Notes (Signed)
Progress Note  Patient Name: Marcus [name] Date of Encounter: 10/12/2017  Primary Cardiologist: Armanda Magic, MD   Subjective   52 year old gentleman with recent admission for acute diastolic heart failure due to mitral regurgitation from a perforated anterior leaflet.  History of CVA from septic emboli.  He had septic arthritis and pneumonia.  He had a full course of antibiotic therapy.  Now on oral vancomycin for [name] colitis.  His diarrhea is gradually getting better.  Seen by Dr. Cornelius Moras .  MV repair surgery  is currently on hold because of his overall poor condition.  White blood cell count is down to 20.8. He is developed hyponatremia-sodium level is 127 He is continued to have significant episodes of diarrhea.  Inpatient Medications    Scheduled Meds: . chlorhexidine  60 mL Topical Once  . gabapentin  300 mg Oral TID  . potassium chloride  40 mEq Oral Daily  . sodium chloride flush  3 mL Intravenous Q12H  . sodium chloride flush  3 mL Intravenous Q12H  . thiamine  100 mg Oral Daily  . vancomycin  125 mg Oral QID   Followed by  . [START ON 10/22/2017] vancomycin  125 mg Oral BID   Followed by  . [START ON 10/30/2017] vancomycin  125 mg Oral Daily   Followed by  . [START ON 11/06/2017] vancomycin  125 mg Oral QODAY   Followed by  . [START ON 11/14/2017] vancomycin  125 mg Oral Q3 days   Continuous Infusions: . sodium chloride    . sodium chloride     PRN Meds: sodium chloride, sodium chloride, acetaminophen, iopamidol, ondansetron (ZOFRAN) IV, oxyCODONE, sodium chloride flush, sodium chloride flush   Vital Signs    Vitals:   10/12/17 0021 10/12/17 0431 10/12/17 0437 10/12/17 0831  BP: 94/63 (!)  Pulse: 89 87  85  Resp: (!) Temp:  98.7 F (37.1 C)  98.9 F (37.2 C)  TempSrc:  Oral  Oral  SpO2: 98% (!) 89%  98%  Weight:  151 lb 12.8 oz (68.9 kg)    Height:        Intake/Output Summary (Last 24 hours) at 10/12/2017  0947 Last data filed at 10/11/2017 2200 Gross per 24 hour  Intake 1080 ml  Output 150 ml  Net 930 ml   Filed Weights   10/10/17 0549 10/11/17 0500 10/12/17 0431  Weight: 148 lb 9.4 oz (67.4 kg) 149 lb 12.8 oz (67.9 kg) 151 lb 12.8 oz (68.9 kg)    Telemetry    Sinus rhythm- Personally Reviewed  ECG     NSR - Personally Reviewed  Physical Exam   Physical Exam: Blood pressure 90/61, pulse 85, temperature 98.9 F (37.2 C), temperature source Oral, resp. rate 15, height  (1.803 m), weight 151 lb 12.8 oz (68.9 kg), SpO2 98 %.  GEN:  Well nourished, well developed in no acute distress HEENT: Normal NECK: No JVD; No carotid bruits LYMPHATICS: No lymphadenopathy CARDIAC: 3 per 6 murmur radiating to the axilla. RESPIRATORY:  Clear to auscultation without rales, wheezing or rhonchi  ABDOMEN: Bowel sounds.  Mildly distended. MUSCULOSKELETAL: , mild  Tenderness left knee. SKIN: Warm and dry NEUROLOGIC: Weakness of his right hand and arm.   Labs    Chemistry Recent Labs  Lab 10/07/17 0302 10/08/17 0520  10/10/17 1007 10/11/17 0252 10/12/17 0241  NA 130* 131*   < > 129* 127* 127*  K 3.8  3.3*   < > 3.5 4.0 3.8  CL 94* 96*   < > 95* 94* 95*  CO2 26 25   < > GLUCOSE 111* 126*   < > 110* 119* 105*  BUN 33* 30*   < > 32* 28* 22*  CREATININE 1.62* 1.76*   < > 2.05* 1.74* 1.43*  CALCIUM 8.5* 8.3*   < > 8.2* 8.4* 8.1*  PROT 7.1 6.7  --   --   --   --   ALBUMIN 2.8* 2.5*  --   --   --   --   AST 38 34  --   --   --   --   ALT 49 43  --   --   --   --   ALKPHOS 98 93  --   --   --   --   BILITOT 0.5 0.9  --   --   --   --   GFRNONAA 47* 43*   < > 36* 43* 55*  GFRAA 55* 50*   < > 41* 50* >60  ANIONGAP 10 10   < > < > = values in this interval not displayed.     Hematology Recent Labs  Lab 10/10/17 0731 10/11/17 0252 10/12/17 0241  WBC 24.6* 23.8* 20.8*  RBC 3.70* 4.33 3.73*  HGB 9.3* 11.1* 9.4*  HCT 29.4* 35.2* 29.4*  MCV 79.5 81.3 78.8   MCH 25.1* 25.6* 25.2*  MCHC 31.6 31.5 32.0  RDW 17.2* 17.5* 17.3*  PLT 274 165 283    Cardiac EnzymesNo results for input(s): TROPONINI in the last 168 hours. No results for input(s): TROPIPOC in the last 168 hours.   BNPNo results for input(s): BNP, PROBNP in the last 168 hours.   DDimer No results for input(s): DDIMER in the last 168 hours.   Radiology    No results found.  Cardiac Studies     Patient Profile     52 y.o. male with history of polysubstance abuse including IV drug abuse.  He was admitted with acute diastolic congestive heart failure related to perforated mitral valve which was due to endocarditis. His course is also been complicated by an embolic stroke, septic arthritis of the left knee, hepatitis C pleural effusions.  Assessment & Plan    1.  Acute acute diastolic congestive heart failure.  Diastolic heart failure seems to be fairly stable.  Continue current medications.  2.  [name] colitis: He continues to have diarrhea.  Continue oral vancomycin.  He still has a relatively poor appetite.  3.  Perforated mitral valve: Patient has a perforated mitral valve related to endocarditis.  He has been seen by the surgeons.  He will need to make significant progress before he is a candidate for mitral valve repair  For questions or updates, please contact [facility] Please consult [email or web address] for contact info under Cardiology/STEMI.      Signed, Kristeen Miss, MD  10/12/2017, 9:47 AM

## 2017-10-12 NOTE — Progress Notes (Addendum)
Pt temp 101.0 pt refused tylenol.  Removed 1 blanket. Pt upset, educated pt about need to remove blanket. Will recheck to see if temp comes down.   Pt's sats 87 on room air. Place pt on 3L [place] sats now 12.  Will continue to monitor. Marcus [name]

## 2017-10-13 LAB — CBC
HEMATOCRIT: 29.3 % — AB (ref 39.0–52.0)
Hemoglobin: 9.2 g/dL — ABNORMAL LOW (ref 13.0–17.0)
MCH: 24.7 pg — ABNORMAL LOW (ref 26.0–34.0)
MCHC: 31.4 g/dL (ref 30.0–36.0)
MCV: 78.6 fL (ref 78.0–100.0)
Platelets: 268 10*3/uL (ref 150–400)
RBC: 3.73 MIL/uL — AB (ref 4.22–5.81)
RDW: 17.5 % — ABNORMAL HIGH (ref 11.5–15.5)
WBC: 23 10*3/uL — ABNORMAL HIGH (ref 4.0–10.5)

## 2017-10-13 LAB — BASIC METABOLIC PANEL
ANION GAP: 10 (ref 5–15)
BUN: 19 mg/dL (ref 6–20)
CO2: 21 mmol/L — AB (ref 22–32)
Calcium: 7.9 mg/dL — ABNORMAL LOW (ref 8.9–10.3)
Chloride: 95 mmol/L — ABNORMAL LOW (ref 101–111)
Creatinine, Ser: 1.44 mg/dL — ABNORMAL HIGH (ref 0.61–1.24)
GFR calc Af Amer: >60 mL/min (ref >60)
GFR, EST NON AFRICAN AMERICAN: 54 mL/min — AB (ref >60)
GLUCOSE: 116 mg/dL — AB (ref 65–99)
POTASSIUM: 3.9 mmol/L (ref 3.5–5.1)
Sodium: 126 mmol/L — ABNORMAL LOW (ref 135–145)

## 2017-10-13 MED ORDER — FUROSEMIDE 10 MG/ML IJ SOLN
40.0000 mg | Freq: Two times a day (BID) | INTRAMUSCULAR | Status: DC
Start: 2017-10-13 — End: 2017-10-15
  Administered 2017-10-13 – 2017-10-14 (×4): 40 mg via INTRAVENOUS
  Filled 2017-10-13 (×4): qty 4

## 2017-10-13 MED ORDER — AMIODARONE HCL 200 MG PO TABS
200.0000 mg | ORAL_TABLET | Freq: Every day | ORAL | Status: DC
  Administered 2017-10-13 – 2017-10-22 (×10): 200 mg via ORAL
  Filled 2017-10-13 (×10): qty 1

## 2017-10-13 NOTE — Progress Notes (Signed)
Occupational Therapy Treatment Patient Details Name: Marcus [name] MRN: [id number] DOB: 02/28/1966 Today's Date: 10/13/2017    History of present illness Marcus [name] is a 52 y.o. 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 Admitted forA/C diastolic HFwith elevated BNP and AKI. R heart cath 10/01/17   OT comments  Pt received supine in bed with bilateral knees flexed and visitor at bedside. Pt verbalizing frustration about medical situation, functional decline, and c-diff. Pt with depressed mood. With encouragement, pt agreeable to sit EOB and perform exercises. Pt performing lateral scoots towards HOB with Min A for weight shifting. Encouraged pt for OOB activity; pt adamantly declining due to left knee pain and feel poorly. Pt will require post-acute rehab at dc and continue to recommend dc to CIR. However, may need update to SNF if activity tolerance does not progress. Will continue to follow acutely.   Follow Up Recommendations  CIR;Supervision/Assistance - 24 hour    Equipment Recommendations  Other (comment)(TBD next venue)    Recommendations for Other Services Rehab consult    Precautions / Restrictions Precautions Precautions: Fall;Other (comment)(enteric) Precaution Comments: has left neoprene knee brace from home Restrictions Weight Bearing Restrictions: No       Mobility Bed Mobility Overal bed mobility: Modified Independent Bed Mobility: Supine to Sit;Sit to Supine           General bed mobility comments: Significant amount of time to transition in bed and pt cautious about movement of L knee  Transfers Overall transfer level: Needs assistance   Transfers: Lateral/Scoot Transfers          Lateral/Scoot Transfers: Min assist General transfer comment: Pt performing two  lateral scoots towards HOB. Min A to to facilitate weight shifting during lateral scoot. Use of bed pad to transition hips.    Balance Overall balance assessment: Needs assistance Sitting-balance support: No upper extremity supported;Feet supported Sitting balance-Leahy Scale: Good                                     ADL either performed or assessed with clinical judgement   ADL Overall ADL's : Needs assistance/impaired                                       General ADL Comments: Pt declining ADLs at this time stating he doesnt feel well and doesn't want to do anything. Focusign session excercises at EOB and increasing activity tolerance.     Vision       Perception     Praxis      Cognition Arousal/Alertness: Awake/alert Behavior During Therapy: WFL for tasks assessed/performed Overall Cognitive Status: Within Functional Limits for tasks assessed                                 General Comments: Pt verbalizing frustration with situation and having C-diff for the second time.        Exercises Exercises: General Lower Extremity;General Upper Extremity General Exercises - Upper Extremity Shoulder Flexion: AAROM;Left;10 reps;Seated Elbow Flexion: AROM;Both;10 reps;Seated;Bar weights/barbell Wrist Flexion: AROM;Both;10 reps;Seated;Bar weights/barbell Wrist Extension: AROM;Both;10 reps;Seated;Bar weights/barbell General Exercises -  Lower Extremity Long Arc Quad: 5 reps;Left;PROM;AAROM;Seated   Shoulder Instructions       General Comments SPO2 staying in low 90s thorughout sessio non room air    Pertinent Vitals/ Pain       Pain Assessment: Faces Faces Pain Scale: Hurts even more Pain Location: left knee Pain Descriptors / Indicators: Discomfort;Grimacing;Guarding Pain Intervention(s): Monitored during session;Limited activity within patient's tolerance;Repositioned  Home Living                                           Prior Functioning/Environment              Frequency  Min 3X/week        Progress Toward Goals  OT Goals(current goals can now be found in the care plan section)  Progress towards OT goals: Progressing toward goals  Acute Rehab OT Goals Patient Stated Goal: to get as independent as possible and work as hard as possible OT Goal Formulation: With patient Time For Goal Achievement: 10/16/17 Potential to Achieve Goals: Good ADL Goals Pt Will Perform Upper Body Bathing: with modified independence;with caregiver independent in assisting;with adaptive equipment;sitting Pt Will Perform Lower Body Bathing: with modified independence;with caregiver independent in assisting;with adaptive equipment;sit to/from stand Pt Will Transfer to Toilet: with modified independence;ambulating Pt Will Perform Toileting - Clothing Manipulation and hygiene: with modified independence;sit to/from stand;with adaptive equipment Additional ADL Goal #1: Pt will perform bed mobility at mod I level prior to participating in ADL activity  Plan Discharge plan remains appropriate    Co-evaluation                 AM-PAC PT "6 Clicks" Daily Activity     Outcome Measure   Help from another person eating meals?: None Help from another person taking care of personal grooming?: A Little Help from another person toileting, which includes using toliet, bedpan, or urinal?: A Lot Help from another person bathing (including washing, rinsing, drying)?: A Lot Help from another person to put on and taking off regular upper body clothing?: A Little Help from another person to put on and taking off regular lower body clothing?: A Lot 6 Click Score: 16    End of Session    OT Visit Diagnosis: Unsteadiness on feet (R26.81);Other abnormalities of gait and mobility (R26.89);Muscle weakness (generalized) (M62.81);Pain Pain - Right/Left: Left Pain - part of body: (back)   Activity Tolerance  Patient limited by fatigue(Limited by frustration on situation)   Patient Left with call bell/phone within reach;in bed   Nurse Communication Mobility status        Time: 4098-1191 OT Time Calculation (min): 29 min  Charges: OT General Charges $OT Visit: 1 Visit OT Treatments $Self Care/Home Management : 23-37 mins  Ebonie Westerlund MSOT, OTR/L Acute Rehab Pager: [phone] Office: [phone]   Theodoro Grist Beverley Allender 10/13/2017, 11:16 AM

## 2017-10-13 NOTE — Progress Notes (Signed)
Patient ID: Marcus [name], male   DOB: Feb 16, 1966, 52 y.o.   MRN: [id number] Patient ID: Marcus [name], male   DOB: 28-Aug-1965, 52 y.o.   MRN: [id number]     Advanced Heart Failure Rounding Note  PCP-Cardiologist: Armanda Magic, MD  HF Cardiology: Shirlee Latch  Subjective:    Lasix on hold with AKI, creatinine down to 1.44. On Vanc for C difficile. Completing 6 weeks of vancomycin.   Still with abdominal discomfort but has been eating.  No BMs overnight.  Tm 101 last night.    Has not been walking much, left knee is hurting again.   Weight up today.   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 + Diffuse colonic wall thickening consistent with colitis.   + C-Diff test 10/08/17. Started on Vancomycin.   Marland Kitchen  RHC 10/01/17 RHC Procedural Findings: Hemodynamics (mmHg) RA mean 11 RV 47/15 PA 49/25, mean 38 PCWP mean 19, v-waves to 35 Oxygen saturations: PA 52% AO 94% Cardiac Output (Fick) 4.35  Cardiac Index (Fick) 2.35  PVR 4.4 WU  Objective:   Weight Range: 153 lb 3.2 oz (69.5 kg) Body mass index is 21.37 kg/m.   Vital Signs:   Temp:  [98.4 F (36.9 C)-101 F (38.3 C)] 98.9 F (37.2 C) (05/13 0331) Pulse Rate:  [85-100] 89 (05/13 0331) Resp:  [15-27] 19 (05/13 0331) BP: (89-110)/(61-75) 89/66 (05/13 0331) SpO2:  [87 %-98 %] 92 % (05/13 0331) Weight:  [153 lb 3.2 oz (69.5 kg)] 153 lb 3.2 oz (69.5 kg) (05/13 0331) Last BM Date: 10/12/17  Weight change: Filed Weights   10/11/17 0500 10/12/17 0431 10/13/17 0331  Weight: 149 lb 12.8 oz (67.9 kg) 151 lb 12.8 oz (68.9 kg) 153 lb 3.2 oz (69.5 kg)    Intake/Output:   Intake/Output Summary (Last 24 hours) at 10/13/2017 0752 Last data filed at 10/13/2017 0331 Gross per 24 hour  Intake 960 ml  Output 400 ml  Net 560 ml      Physical Exam   General: NAD Neck: JVP 12 cm, no thyromegaly or thyroid nodule.  Lungs: Decreased breath sounds right base.  CV:  Nondisplaced PMI.  Heart regular S1/S2, no S3/S4, 3/6 HSM apex.  No peripheral edema.   Abdomen: Soft, mild diffuse tenderness, no hepatosplenomegaly, no distention.  Skin: Intact without lesions or rashes.  Neurologic: Alert and oriented x 3.  Psych: Normal affect. Extremities: No clubbing or cyanosis.  HEENT: Normal.   Telemetry   NSR 80s personally reviewed.   Labs    CBC Recent Labs    10/12/17 0241 10/13/17 0214  WBC 20.8* 23.0*  HGB 9.4* 9.2*  HCT 29.4* 29.3*  MCV 78.8 78.6  PLT 283 268   Basic Metabolic Panel Recent Labs    72/53/66 0241 10/13/17 0214  NA 127* 126*  K 3.8 3.9  CL 95* 95*  CO2 23 21*  GLUCOSE 105* 116*  BUN 22* 19  CREATININE 1.43* 1.44*  CALCIUM 8.1* 7.9*   Liver Function Tests No results for input(s): AST, ALT, ALKPHOS, BILITOT, PROT, ALBUMIN in the last 72 hours. No results for input(s): LIPASE, AMYLASE in the last 72 hours. Cardiac Enzymes No results for input(s): CKTOTAL, CKMB, CKMBINDEX, TROPONINI in the last 72 hours.  BNP: BNP (last 3 results) Recent Labs    08/09/17 1607 09/30/17 1500  BNP 449.0* 706.9*    ProBNP (last 3 results) Recent Labs    09/29/17 1350  PROBNP  16,109*     D-Dimer No results for input(s): DDIMER in the last 72 hours. Hemoglobin A1C No results for input(s): HGBA1C in the last 72 hours. Fasting Lipid Panel No results for input(s): CHOL, HDL, LDLCALC, TRIG, CHOLHDL, LDLDIRECT in the last 72 hours. Thyroid Function Tests No results for input(s): TSH, T4TOTAL, T3FREE, THYROIDAB in the last 72 hours.  Invalid input(s): FREET3  Other results:   Imaging    No results found.   Medications:     Scheduled Medications: . chlorhexidine  60 mL Topical Once  . gabapentin  300 mg Oral TID  . potassium chloride  40 mEq Oral Daily  . sodium chloride flush  3 mL Intravenous Q12H  . sodium chloride flush  3 mL Intravenous Q12H  . thiamine  100 mg Oral Daily  . vancomycin  125 mg Oral QID    Followed by  . [START ON 10/22/2017] vancomycin  125 mg Oral BID   Followed by  . [START ON 10/30/2017] vancomycin  125 mg Oral Daily   Followed by  . [START ON 11/06/2017] vancomycin  125 mg Oral QODAY   Followed by  . [START ON 11/14/2017] vancomycin  125 mg Oral Q3 days    Infusions: . sodium chloride    . sodium chloride      PRN Medications: sodium chloride, sodium chloride, acetaminophen, iopamidol, ondansetron (ZOFRAN) IV, oxyCODONE, sodium chloride flush, sodium chloride flush   Patient Profile   TYAIRE ODEM is a 52 y.o. 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   Assessment/Plan   1. Acute diastolic CHF: Due to severe mitral regurgitation from perforated anterior leaflet.  He was admitted markedly volume overloaded on exam.  Symptoms were baseline, NYHA class III, likely due to a combination of deconditioning and CHF.  Echo showed that LV EF remains normal at 55-60%.  However, RV has mild to moderate systolic dysfunction. RHC 10/01/17 mildly elevated PCWP and pulmonary venous hypertension. Relatively preserved CO.  JVP elevated today, weight trending up.  Creatinine coming down. He is now eating.  - I will restart IV Lasix today, 40 mg IV bid.  2. Severe mitral regurgitation: Due to prior endocarditis with MSSA. 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  - BP low. No room for afterload.   - MV repair on hold with C-Diff. 3. Aflutter with RVR:  Maintaining NSR.  - Off amio and heparin.   - I will add back amiodarone 200 mg daily to try to maintain NSR, I do not think amiodarone caused his abdominal symptoms (was C difficile).  4. ID: MSSA bacteremia with MV  endocarditis, CVA from septic emboli, septic arthritis, and PNA.  He has had a full course of antibiotic treatment and PICC is now out.  Now with recurrent C difficile colitis. WBCs remain elevated and was febrile overnight but diarrhea seems to be slowing.  - On po vanc for 6 weeks.    5. H/o septic arthritis: Left knee.  This significantly limits his mobility.  - PT following, needs to see today.  6. Right pleural effusion: Moderate on CXR this admission.  Chronic, likely due to CHF.  7. Polysubstance abuse:  Prior use of opioids. Encouraged  complete cessation.  8. AKI on CKD: Stage 3. Creatinine back to baseline.  As above, restarting Lasix.  9. Diarrhea: He had C difficile back in March.  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  Stools seem to be slowing.  - Now on po vanc with taper per ID.    Doing somewhat better today.  Needs to work with PT, getting less mobile because of knee pain.  Think he will end up needing CIR stay prior to MV surgery.  Length of Stay: 24  Marca Ancona, MD  10/13/2017, 7:52 AM  Advanced Heart Failure Team Pager [phone] (M-F; 7a - 4p)  Please contact [facility] for night-coverage after hours (4p -7a ) and weekends on [email or web address]

## 2017-10-14 DIAGNOSIS — A0471 Enterocolitis due to Clostridium difficile, recurrent: Secondary | ICD-10-CM

## 2017-10-14 DIAGNOSIS — D72829 Elevated white blood cell count, unspecified: Secondary | ICD-10-CM

## 2017-10-14 DIAGNOSIS — R000 Tachycardia, unspecified: Secondary | ICD-10-CM

## 2017-10-14 DIAGNOSIS — B1920 Unspecified viral hepatitis C without hepatic coma: Secondary | ICD-10-CM

## 2017-10-14 DIAGNOSIS — I5023 Acute on chronic systolic (congestive) heart failure: Secondary | ICD-10-CM

## 2017-10-14 DIAGNOSIS — F1910 Other psychoactive substance abuse, uncomplicated: Secondary | ICD-10-CM

## 2017-10-14 DIAGNOSIS — E871 Hypo-osmolality and hyponatremia: Secondary | ICD-10-CM

## 2017-10-14 DIAGNOSIS — I6930 Unspecified sequelae of cerebral infarction: Secondary | ICD-10-CM

## 2017-10-14 DIAGNOSIS — R0682 Tachypnea, not elsewhere classified: Secondary | ICD-10-CM

## 2017-10-14 DIAGNOSIS — I5032 Chronic diastolic (congestive) heart failure: Secondary | ICD-10-CM

## 2017-10-14 DIAGNOSIS — I4891 Unspecified atrial fibrillation: Secondary | ICD-10-CM

## 2017-10-14 DIAGNOSIS — J90 Pleural effusion, not elsewhere classified: Secondary | ICD-10-CM

## 2017-10-14 DIAGNOSIS — D62 Acute posthemorrhagic anemia: Secondary | ICD-10-CM

## 2017-10-14 DIAGNOSIS — E876 Hypokalemia: Secondary | ICD-10-CM

## 2017-10-14 DIAGNOSIS — M009 Pyogenic arthritis, unspecified: Secondary | ICD-10-CM

## 2017-10-14 LAB — BASIC METABOLIC PANEL
Anion gap: 13 (ref 5–15)
BUN: 19 mg/dL (ref 6–20)
CO2: 22 mmol/L (ref 22–32)
CREATININE: 1.49 mg/dL — AB (ref 0.61–1.24)
Calcium: 7.8 mg/dL — ABNORMAL LOW (ref 8.9–10.3)
Chloride: 93 mmol/L — ABNORMAL LOW (ref 101–111)
GFR calc non Af Amer: 52 mL/min — ABNORMAL LOW (ref >60)
Glucose, Bld: 111 mg/dL — ABNORMAL HIGH (ref 65–99)
POTASSIUM: 3.3 mmol/L — AB (ref 3.5–5.1)
SODIUM: 128 mmol/L — AB (ref 135–145)

## 2017-10-14 LAB — CBC
HCT: 28.4 % — ABNORMAL LOW (ref 39.0–52.0)
HEMOGLOBIN: 9.1 g/dL — AB (ref 13.0–17.0)
MCH: 25.6 pg — ABNORMAL LOW (ref 26.0–34.0)
MCHC: 32 g/dL (ref 30.0–36.0)
MCV: 79.8 fL (ref 78.0–100.0)
Platelets: 267 10*3/uL (ref 150–400)
RBC: 3.56 MIL/uL — AB (ref 4.22–5.81)
RDW: 18 % — ABNORMAL HIGH (ref 11.5–15.5)
WBC: 18.7 10*3/uL — ABNORMAL HIGH (ref 4.0–10.5)

## 2017-10-14 MED ORDER — POTASSIUM CHLORIDE CRYS ER 20 MEQ PO TBCR
40.0000 meq | EXTENDED_RELEASE_TABLET | Freq: Once | ORAL | Status: AC
  Administered 2017-10-14: 40 meq via ORAL
  Filled 2017-10-14: qty 2

## 2017-10-14 MED ORDER — POTASSIUM CHLORIDE CRYS ER 20 MEQ PO TBCR
40.0000 meq | EXTENDED_RELEASE_TABLET | Freq: Every day | ORAL | Status: DC
  Administered 2017-10-14: 40 meq via ORAL
  Filled 2017-10-14: qty 2

## 2017-10-14 MED ORDER — POTASSIUM CHLORIDE CRYS ER 20 MEQ PO TBCR: 40.0000 meq | EXTENDED_RELEASE_TABLET | Freq: Once | ORAL | Status: DC

## 2017-10-14 MED ORDER — METRONIDAZOLE 500 MG PO TABS
500.0000 mg | ORAL_TABLET | Freq: Three times a day (TID) | ORAL | Status: DC
  Administered 2017-10-14 – 2017-10-22 (×25): 500 mg via ORAL
  Filled 2017-10-14 (×25): qty 1

## 2017-10-14 MED ORDER — POTASSIUM CHLORIDE 20 MEQ/15ML (10%) PO SOLN: 40.0000 meq | Freq: Every day | ORAL | Status: DC
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
## 2017-10-14 NOTE — Progress Notes (Signed)
Physical Therapy Treatment Patient Details Name: Marcus [name] MRN: [id number] DOB: [birth year] Today's Date: 10/14/2017    History of Present Illness TORELL MINDER is a 52 y.o. 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 Admitted forA/C diastolic HFwith elevated BNP and AKI. R heart cath 10/01/17    PT Comments    Pt willing to participate with therapy and reports recent decline of participation is due to persistent diarrhea from C-Diff. Pt demonstrated increased time for bed mobility. Session focused on sit<>stand transfers and ambulation with L knee immobilizer donned. Pt reports increased stability with knee immobilizer. Pt required frequent cueing for upright posture during gait and requested to sit after 22 ft due to BUE fatigue. Pt educated regarding importance of increased OOB mobility and participation with therapy.  Follow Up Recommendations  CIR;Supervision/Assistance - 24 hour     Equipment Recommendations  None recommended by PT    Recommendations for Other Services       Precautions / Restrictions Precautions Precautions: Fall;Other (comment)(Enteric) Required Braces or Orthoses: Knee Immobilizer - Left Knee Immobilizer - Left: On when out of bed or walking Restrictions Weight Bearing Restrictions: No    Mobility  Bed Mobility Overal bed mobility: Needs Assistance Bed Mobility: Supine to Sit     Supine to sit: HOB elevated;Supervision     General bed mobility comments: Pt performed supine>sit EOB with HOB elevated with increased time required. L knee immobilizer donned in supine.   Transfers Overall transfer level: Needs assistance Equipment used: Rolling walker (2 wheeled) Transfers: Sit to/from Stand Sit to Stand: Min assist         General transfer comment: Pt  performed sit<>stand from EOB x 2 with Min A and RW. Pt successful when placing BUE on RW and PT anchoring anterior RW. Due to knee immobilizer keeping LLE in near extension, pt uses RLE and BUE to perform transfer.  Ambulation/Gait Ambulation/Gait assistance: +2 safety/equipment;Min guard(Chair follow) Ambulation Distance (Feet): 22 Feet Assistive device: Rolling walker (2 wheeled) Gait Pattern/deviations: Step-to pattern;Decreased stride length;Decreased stance time - left;Decreased weight shift to left;Trunk flexed Gait velocity: decreased Gait velocity interpretation: <1.31 ft/sec, indicative of household ambulator General Gait Details: Pt ambulates with increased hip flexion and difficulty accepting weight onto LLE. Pt ambulates with decreased speed but demonstrates improved ability to stand upright in between steps. Pt reports L knee immobilizer increasing sense of stability   Stairs             Wheelchair Mobility    Modified Rankin (Stroke Patients Only)       Balance Overall balance assessment: Needs assistance Sitting-balance support: No upper extremity supported;Feet supported Sitting balance-Leahy Scale: Good     Standing balance support: Bilateral upper extremity supported Standing balance-Leahy Scale: Poor Standing balance comment: reliant on external support of RW                            Cognition Arousal/Alertness: Awake/alert Behavior During Therapy: WFL for tasks assessed/performed Overall Cognitive Status: Within Functional Limits for tasks assessed                                        Exercises      General  Comments        Pertinent Vitals/Pain Pain Assessment: 0-10 Pain Score: 5  Pain Location: left knee Pain Descriptors / Indicators: Discomfort;Grimacing;Guarding Pain Intervention(s): Limited activity within patient's tolerance;Monitored during session    Home Living                      Prior  Function            PT Goals (current goals can now be found in the care plan section) Acute Rehab PT Goals Patient Stated Goal: to get as independent as possible and work as hard as possible PT Goal Formulation: With patient Time For Goal Achievement: 10/16/17 Potential to Achieve Goals: Good Progress towards PT goals: Progressing toward goals    Frequency    Min 3X/week      PT Plan Current plan remains appropriate    Co-evaluation              AM-PAC PT "6 Clicks" Daily Activity  Outcome Measure  Difficulty turning over in bed (including adjusting bedclothes, sheets and blankets)?: A Little Difficulty moving from lying on back to sitting on the side of the bed? : A Little Difficulty sitting down on and standing up from a chair with arms (e.g., wheelchair, bedside commode, etc,.)?: Unable Help needed moving to and from a bed to chair (including a wheelchair)?: A Lot Help needed walking in hospital room?: A Little Help needed climbing 3-5 steps with a railing? : Total 6 Click Score: 13    End of Session Equipment Utilized During Treatment: Gait belt;Left knee immobilizer Activity Tolerance: Patient tolerated treatment well Patient left: in chair;with chair alarm set;with call bell/phone within reach;Other (comment)(Kept L knee immobilizer on due to pt request for OOB mobility to use bathroom) Nurse Communication: Mobility status PT Visit Diagnosis: Unsteadiness on feet (R26.81);Difficulty in walking, not elsewhere classified (R26.2);Muscle weakness (generalized) (M62.81)     Time: 1610-9604 PT Time Calculation (min) (ACUTE ONLY): 30 min  Charges:  $Gait Training: 8-22 mins $Therapeutic Activity: 8-22 mins                    G Codes:       Gabe Roland Prine, SPT   [employer] 10/14/2017, 9:31 AM

## 2017-10-14 NOTE — Progress Notes (Signed)
Patient ID: Marcus [name], male   DOB: 09-16-1965, 52 y.o.   MRN: [id number]     Advanced Heart Failure Rounding Note  PCP-Cardiologist: Armanda Magic, MD  HF Cardiology: Shirlee Latch  Subjective:    On Vanc for C difficile. Completing 6 weeks of vancomycin.   Afebrile, WBCs trending down.  Still with 3-4 loose stools in the last 24 hrs but abdominal pain improving.    Has not been walking much, left knee is hurting again.   IV Lasix started yesterday, good I/Os but weight measured in bed so suspect not accurate.   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 + Diffuse colonic wall thickening consistent with colitis.   + C-Diff test 10/08/17. Started on Vancomycin.    RHC 10/01/17 RHC Procedural Findings: Hemodynamics (mmHg) RA mean 11 RV 47/15 PA 49/25, mean 38 PCWP mean 19, v-waves to 35 Oxygen saturations: PA 52% AO 94% Cardiac Output (Fick) 4.35  Cardiac Index (Fick) 2.35  PVR 4.4 WU  Objective:   Weight Range: 153 lb 1.6 oz (69.4 kg) Body mass index is 21.35 kg/m.   Vital Signs:   Temp:  [98.3 F (36.8 C)-100.4 F (38 C)] 99 F (37.2 C) (05/14 0515) Pulse Rate:  [84-90] 84 (05/14 0515) Resp:  [18-28] 28 (05/14 0515) BP: (80-96)/(53-69) 91/69 (05/14 0515) SpO2:  [92 %-96 %] 93 % (05/14 0505) Weight:  [153 lb 1.6 oz (69.4 kg)] 153 lb 1.6 oz (69.4 kg) (05/14 0515) Last BM Date: 10/13/17  Weight change: Filed Weights   10/12/17 0431 10/13/17 0331 10/14/17 0515  Weight: 151 lb 12.8 oz (68.9 kg) 153 lb 3.2 oz (69.5 kg) 153 lb 1.6 oz (69.4 kg)    Intake/Output:   Intake/Output Summary (Last 24 hours) at 10/14/2017 0749 Last data filed at 10/13/2017 2200 Gross per 24 hour  Intake 880 ml  Output 2726 ml  Net -1846 ml      Physical Exam   General: NAD Neck: JVP 12 cm, no thyromegaly or thyroid nodule.  Lungs: Clear to auscultation bilaterally with normal respiratory effort. CV:  Nondisplaced PMI.  Heart regular S1/S2, no S3/S4, 3/6 HSM apex.  No peripheral edema.    Abdomen: Soft, mild diffuse tenderness, no hepatosplenomegaly, no distention.  Skin: Intact without lesions or rashes.  Neurologic: Alert and oriented x 3.  Psych: Normal affect. Extremities: No clubbing or cyanosis.  HEENT: Normal.    Telemetry   NSR 80s, personally reviewed  Labs    CBC Recent Labs    10/13/17 0214 10/14/17 0305  WBC 23.0* 18.7*  HGB 9.2* 9.1*  HCT 29.3* 28.4*  MCV 78.6 79.8  PLT 268 267   Basic Metabolic Panel Recent Labs    78/29/56 0214 10/14/17 0305  NA 126* 128*  K 3.9 3.3*  CL 95* 93*  CO2 21* 22  GLUCOSE 116* 111*  BUN 19 19  CREATININE 1.44* 1.49*  CALCIUM 7.9* 7.8*   Liver Function Tests No results for input(s): AST, ALT, ALKPHOS, BILITOT, PROT, ALBUMIN in the last 72 hours. No results for input(s): LIPASE, AMYLASE in the last 72 hours. Cardiac Enzymes No results for input(s): CKTOTAL, CKMB, CKMBINDEX, TROPONINI in the last 72 hours.  BNP: BNP (last 3 results) Recent Labs    08/09/17 1607 09/30/17 1500  BNP 449.0* 706.9*    ProBNP (last 3 results) Recent Labs    09/29/17 1350  PROBNP 13,809*     D-Dimer No results  for input(s): DDIMER in the last 72 hours. Hemoglobin A1C No results for input(s): HGBA1C in the last 72 hours. Fasting Lipid Panel No results for input(s): CHOL, HDL, LDLCALC, TRIG, CHOLHDL, LDLDIRECT in the last 72 hours. Thyroid Function Tests No results for input(s): TSH, T4TOTAL, T3FREE, THYROIDAB in the last 72 hours.  Invalid input(s): FREET3  Other results:   Imaging    No results found.   Medications:     Scheduled Medications: . amiodarone  200 mg Oral Daily  . chlorhexidine  60 mL Topical Once  . furosemide  40 mg Intravenous BID  . gabapentin  300 mg Oral TID  . potassium chloride  40 mEq Oral Daily  . potassium chloride  40 mEq Oral Once  . sodium chloride flush  3 mL Intravenous Q12H    . sodium chloride flush  3 mL Intravenous Q12H  . thiamine  100 mg Oral Daily  . vancomycin  125 mg Oral QID   Followed by  . [START ON 10/22/2017] vancomycin  125 mg Oral BID   Followed by  . [START ON 10/30/2017] vancomycin  125 mg Oral Daily   Followed by  . [START ON 11/06/2017] vancomycin  125 mg Oral QODAY   Followed by  . [START ON 11/14/2017] vancomycin  125 mg Oral Q3 days    Infusions: . sodium chloride    . sodium chloride      PRN Medications: sodium chloride, sodium chloride, acetaminophen, iopamidol, ondansetron (ZOFRAN) IV, oxyCODONE, sodium chloride flush, sodium chloride flush   Patient Profile   Marcus [name] is a 52 y.o. 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   Assessment/Plan   1. Acute diastolic CHF: Due to severe mitral regurgitation from perforated anterior leaflet.  He was admitted markedly volume overloaded on exam.  Symptoms were baseline, NYHA class III, likely due to a combination of deconditioning and CHF.  Echo showed that LV EF remains normal at 55-60%.  However, RV has mild to moderate systolic dysfunction. RHC 10/01/17 mildly elevated PCWP and pulmonary venous hypertension. Relatively preserved CO.  JVP remains elevated today, he diuresed well yesterday with IV Lasix.  Creatinine stable.  - Continue Lasix 40 mg IV bid today.   2. Severe mitral regurgitation: Due to prior endocarditis with MSSA. 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  - BP low. No room for afterload reduction.   - MV repair on hold with C-Diff colitis. 3. Aflutter with RVR:  Maintaining NSR.  -  He is back on amiodarone 200 mg daily to try to maintain NSR. 4.  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  He has had a full course of antibiotic treatment and PICC is now out.  Now with recurrent C difficile colitis. WBCs coming down, afebrile, overall starting to improve but still had 3-4 episodes of diarrhea over 24 hrs. - On po vanc for 6 weeks.    5. H/o septic arthritis: Left knee.  This significantly limits his mobility.  - PT following, needs to see today.  6. Right pleural effusion: Moderate on CXR this admission.  Chronic, likely due to CHF.  7. Polysubstance abuse:  Prior use  of opioids. Encouraged complete cessation.  8. AKI on CKD: Stage 3. Creatinine back to baseline.  As above, restarted Lasix.  9. Diarrhea: He had C difficile back in March.  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  Stools seem to be slowing.  - Now on po vanc with taper per ID.    Continue work with PT.  I will consult CIR today, he is getting more deconditioned and will need to work on mobility prior to surgery.    Length of Stay: 63  Marca Ancona, MD  10/14/2017, 7:49 AM  Advanced Heart Failure Team Pager [phone] (M-F; 7a - 4p)  Please contact [facility] for night-coverage after hours (4p -7a ) and weekends on [email or web address]

## 2017-10-15 LAB — BASIC METABOLIC PANEL
ANION GAP: 12 (ref 5–15)
BUN: 20 mg/dL (ref 6–20)
CALCIUM: 8 mg/dL — AB (ref 8.9–10.3)
CHLORIDE: 92 mmol/L — AB (ref 101–111)
CO2: 25 mmol/L (ref 22–32)
Creatinine, Ser: 1.79 mg/dL — ABNORMAL HIGH (ref 0.61–1.24)
GFR calc non Af Amer: 42 mL/min — ABNORMAL LOW (ref >60)
GFR, EST AFRICAN AMERICAN: 49 mL/min — AB (ref >60)
Glucose, Bld: 106 mg/dL — ABNORMAL HIGH (ref 65–99)
Potassium: 3.2 mmol/L — ABNORMAL LOW (ref 3.5–5.1)
Sodium: 129 mmol/L — ABNORMAL LOW (ref 135–145)

## 2017-10-15 LAB — MAGNESIUM: Magnesium: 1.7 mg/dL (ref 1.7–2.4)

## 2017-10-15 MED ORDER — POTASSIUM CHLORIDE CRYS ER 20 MEQ PO TBCR
60.0000 meq | EXTENDED_RELEASE_TABLET | Freq: Once | ORAL | Status: AC
  Administered 2017-10-15: 60 meq via ORAL
  Filled 2017-10-15: qty 3

## 2017-10-15 MED ORDER — MAGNESIUM SULFATE 2 GM/50ML IV SOLN
2.0000 g | Freq: Once | INTRAVENOUS | Status: AC
Start: 2017-10-15 — End: 2017-10-15
  Administered 2017-10-15: 2 g via INTRAVENOUS
  Filled 2017-10-15: qty 50

## 2017-10-15 MED ORDER — FUROSEMIDE 40 MG PO TABS
40.0000 mg | ORAL_TABLET | Freq: Every day | ORAL | Status: DC
  Administered 2017-10-15: 40 mg via ORAL
  Filled 2017-10-15: qty 1

## 2017-10-15 MED ORDER — POTASSIUM CHLORIDE 20 MEQ/15ML (10%) PO SOLN
40.0000 meq | Freq: Every day | ORAL | Status: DC
  Administered 2017-10-15 – 2017-10-18 (×4): 40 meq via ORAL
  Filled 2017-10-15 (×4): qty 30

## 2017-10-15 NOTE — Progress Notes (Addendum)
Patient ID: Marcus [name], male   DOB: 1965-08-23, 52 y.o.   MRN: [id number]     Advanced Heart Failure Rounding Note  PCP-Cardiologist: Armanda Magic, MD  HF Cardiology: Shirlee Latch  Subjective:    On Vanc for C difficile. Completing 6 weeks of vancomycin.   'Started Flagyl 5/14.  2-3 BMs a day.   Denies SOB.   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 + Diffuse colonic wall thickening consistent with colitis.   + C-Diff test 10/08/17. Started on Vancomycin.    RHC 10/01/17 RHC Procedural Findings: Hemodynamics (mmHg) RA mean 11 RV 47/15 PA 49/25, mean 38 PCWP mean 19, v-waves to 35 Oxygen saturations: PA 52% AO 94% Cardiac Output (Fick) 4.35  Cardiac Index (Fick) 2.35  PVR 4.4 WU  Objective:   Weight Range: 153 lb 14.4 oz (69.8 kg) Body mass index is 21.46 kg/m.   Vital Signs:   Temp:  [98.2 F (36.8 C)-98.7 F (37.1 C)] 98.4 F (36.9 C) (05/15 0418) Pulse Rate:  [84-93] 84 (05/15 0418) Resp:  [15-28] 15 (05/15 0418) BP: (82-107)/(64-74) 102/74 (05/15 0613) SpO2:  [94 %-97 %] 95 % (05/15 0418) Weight:  [153 lb 14.4 oz (69.8 kg)] 153 lb 14.4 oz (69.8 kg) (05/15 0613) Last BM Date: 10/13/17  Weight change: Filed Weights   10/13/17 0331 10/14/17 0515 10/15/17 4098  Weight: 153 lb 3.2 oz (69.5 kg) 153 lb 1.6 oz (69.4 kg) 153 lb 14.4 oz (69.8 kg)    Intake/Output:   Intake/Output Summary (Last 24 hours) at 10/15/2017 0730 Last data filed at 10/15/2017 0007 Gross per 24 hour  Intake 486 ml  Output 1650 ml  Net -1164 ml      Physical Exam   General:  NAD. In bed.  HEENT: normal Neck: supple. JVP ~10. Carotids 2+ bilat; no bruits. No lymphadenopathy or thryomegaly appreciated. Cor: PMI nondisplaced. Regular rate & rhythm. No rubs, gallops. 3/'6 HSM apex.  Lungs: clear Abdomen: soft, nontender, nondistended. No hepatosplenomegaly. No bruits or masses. Good bowel sounds. Extremities:  no cyanosis, clubbing, rash, edema Neuro: alert & orientedx3, cranial nerves grossly intact. moves all 4 extremities w/o difficulty. Affect pleasant .    Telemetry   NSR 80s, personally reviewed  Labs    CBC Recent Labs    10/13/17 0214 10/14/17 0305  WBC 23.0* 18.7*  HGB 9.2* 9.1*  HCT 29.3* 28.4*  MCV 78.6 79.8  PLT 268 267   Basic Metabolic Panel Recent Labs    11/91/47 0305 10/15/17 0229  NA 128* 129*  K 3.3* 3.2*  CL 93* 92*  CO2 22 25  GLUCOSE 111* 106*  BUN 19 20  CREATININE 1.49* 1.79*  CALCIUM 7.8* 8.0*  MG  --  1.7   Liver Function Tests No results for input(s): AST, ALT, ALKPHOS, BILITOT, PROT, ALBUMIN in the last 72 hours. No results for input(s): LIPASE, AMYLASE in the last 72 hours. Cardiac Enzymes No results for input(s): CKTOTAL, CKMB, CKMBINDEX, TROPONINI in the last 72 hours.  BNP: BNP (last 3 results) Recent Labs    08/09/17 1607 09/30/17 1500  BNP 449.0* 706.9*    ProBNP (last 3 results) Recent Labs    09/29/17 1350  PROBNP 13,809*     D-Dimer No results for input(s): DDIMER in the last 72 hours. Hemoglobin A1C No results for input(s): HGBA1C in the last 72 hours. Fasting Lipid Panel No results for input(s): CHOL, HDL, LDLCALC, TRIG, CHOLHDL,  LDLDIRECT in the last 72 hours. Thyroid Function Tests No results for input(s): TSH, T4TOTAL, T3FREE, THYROIDAB in the last 72 hours.  Invalid input(s): FREET3  Other results:   Imaging    No results found.   Medications:     Scheduled Medications: . amiodarone  200 mg Oral Daily  . chlorhexidine  60 mL Topical Once  . furosemide  40 mg Intravenous BID  . gabapentin  300 mg Oral TID  . metroNIDAZOLE  500 mg Oral Q8H  . potassium chloride  40 mEq Oral Daily  . sodium chloride flush  3 mL Intravenous Q12H  . sodium chloride flush  3 mL Intravenous Q12H  . thiamine  100 mg Oral Daily  . vancomycin  125 mg Oral QID   Followed by  . [START ON 10/22/2017] vancomycin  125  mg Oral BID   Followed by  . [START ON 10/30/2017] vancomycin  125 mg Oral Daily   Followed by  . [START ON 11/06/2017] vancomycin  125 mg Oral QODAY   Followed by  . [START ON 11/14/2017] vancomycin  125 mg Oral Q3 days    Infusions: . sodium chloride    . sodium chloride      PRN Medications: sodium chloride, sodium chloride, acetaminophen, iopamidol, ondansetron (ZOFRAN) IV, oxyCODONE, sodium chloride flush, sodium chloride flush   Patient Profile   JEURY MCNAB is a 52 y.o. 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   Assessment/Plan   1. Acute diastolic CHF: Due to severe mitral regurgitation from perforated anterior leaflet.  He was admitted markedly volume overloaded on exam.  Symptoms were baseline, NYHA class III, likely due to a combination of deconditioning and CHF.  Echo showed that LV EF remains normal at 55-60%.  However, RV has mild to moderate systolic dysfunction. RHC 10/01/17 mildly elevated PCWP and pulmonary venous hypertension. Relatively preserved CO. Creatinine trending up, 1.4>1.7.  - Transition to po Lasix today.   - BMET in am.    2. Severe mitral regurgitation: Due to prior endocarditis with MSSA. 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  - BP low. No room for afterload reduction.   - MV repair on hold with C-Diff colitis. 3. Aflutter with RVR:  Maintaining NSR.  -  Maintaining NSR. Continue amio.  4. 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  He has had a full course of antibiotic treatment and PICC is now out.  Now with recurrent C difficile colitis.  - CBC pending. 2-3 BM a day.  - On  flagyl.  - On po vanc for 6 weeks.    5. H/o septic arthritis: Left knee.  This significantly limits his mobility.  6. Right pleural effusion: Moderate on CXR this admission.  Chronic, likely due to CHF.  7. Polysubstance abuse:  Prior use of opioids. Encouraged complete cessation.  8. AKI on CKD: Stage 3. Creatinine trendng up. 1.4>1.7 . Hold lasix.   9. Diarrhea: He had C difficile back in March.  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  Stools seem to be slowing.  - Now on po vanc with taper per ID.    Continue to walk.   CIR  recommending SNF.  Possible surgery next week.   Check CBC and BMET daily.  Length of Stay: 15  Amy Clegg, NP  10/15/2017, 7:29 AM  Advanced Heart Failure Team Pager [phone] (M-F; 7a - 4p)  Please contact [facility] for night-coverage after hours (4p -7a ) and weekends on [email or web address]  Patient seen with NP, agree with the above note.  Slow improvement.  Still a couple of loose stools/day.  Abdominal pain improving and has been able to eat some.  Creatinine up to 1.7.  Still with JVD.  - Now on po vancomycin and Flagyl, continue.  - Transition to po Lasix with rise in creatinine.  Still with JVD.  Will be hard to maintain his volume status with severe MR.  - If C difficile colitis improves enough, plan for surgery potentially next Tuesday.  - Ok to go to telemetry.  - Needs to work with PT daily.   Marca Ancona 10/15/2017 8:19 AM

## 2017-10-15 NOTE — Progress Notes (Signed)
Occupational Therapy Treatment Patient Details Name: Marcus [name] MRN: [id number] DOB: Nov 16, 1965 Today's Date: 10/15/2017    History of present illness BUELL PARCEL is a 52 y.o. 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 Admitted forA/C diastolic HFwith elevated BNP and AKI. R heart cath 10/01/17   OT comments  Pt progressing towards established OT goals. Pt performing functional mobility to/from sink and recliner to perform grooming. Pt performing oral care standing at sink with Min A for balance and cues to upright posture. Pt presenting with decreased activity tolerance and required rest break after task. Continue to recommend dc to post-acute rehab and will continue to follow acutely as admitted.    Follow Up Recommendations  CIR;Supervision/Assistance - 24 hour    Equipment Recommendations  Other (comment)(TBD next venue)    Recommendations for Other Services Rehab consult    Precautions / Restrictions Precautions Precautions: Fall(enteric) Precaution Comments: L KI Required Braces or Orthoses: Knee Immobilizer - Left Knee Immobilizer - Left: On when out of bed or walking Restrictions Weight Bearing Restrictions: No       Mobility Bed Mobility Overal bed mobility: Needs Assistance Bed Mobility: Supine to Sit     Supine to sit: HOB elevated;Supervision     General bed mobility comments: In recliner upon arrival and requesting to return to recliner at end of session  Transfers Overall transfer level: Needs assistance Equipment used: Rolling walker (2 wheeled) Transfers: Sit to/from Stand Sit to Stand: Mod assist         General transfer comment: Mod A to power up into standing. Pt able scott to edge of chair and then push through BUEs. Use of gait belt for balance and  power up    Balance Overall balance assessment: Needs assistance Sitting-balance support: No upper extremity supported;Feet supported Sitting balance-Leahy Scale: Good     Standing balance support: Bilateral upper extremity supported Standing balance-Leahy Scale: Poor Standing balance comment: reliant on external support of RW                           ADL either performed or assessed with clinical judgement   ADL Overall ADL's : Needs assistance/impaired     Grooming: Standing;Minimal assistance;Oral care Grooming Details (indicate cue type and reason): Min A for balance while standing at sink during oral care. Pt with decreased activity tolerance and requiring rest break at end of task.                              Functional mobility during ADLs: Minimal assistance;Rolling walker;Min guard General ADL Comments: Pt performing funcitonal mobiltiy sink<>recliner to perform standing grooming task. Pt requiring cues for upright posture. Required rest break at end of task. Pt requiring SIGNFICANT amount of time during ADLs and fucntional mobility.      Vision       Perception     Praxis      Cognition Arousal/Alertness: Awake/alert Behavior During Therapy: WFL for tasks assessed/performed Overall Cognitive Status: Within Functional Limits for tasks assessed                                 General Comments: Requiring encouragment  Exercises     Shoulder Instructions       General Comments      Pertinent Vitals/ Pain       Pain Assessment: Faces Pain Score: 4  Faces Pain Scale: Hurts even more Pain Location: L knee Pain Descriptors / Indicators: Discomfort;Grimacing;Guarding Pain Intervention(s): Monitored during session;Limited activity within patient's tolerance;Repositioned  Home Living                                          Prior Functioning/Environment              Frequency  Min  3X/week        Progress Toward Goals  OT Goals(current goals can now be found in the care plan section)  Progress towards OT goals: Progressing toward goals  Acute Rehab OT Goals Patient Stated Goal: to go home OT Goal Formulation: With patient Time For Goal Achievement: 10/16/17 Potential to Achieve Goals: Good ADL Goals Pt Will Perform Upper Body Bathing: with modified independence;with caregiver independent in assisting;with adaptive equipment;sitting Pt Will Perform Lower Body Bathing: with modified independence;with caregiver independent in assisting;with adaptive equipment;sit to/from stand Pt Will Transfer to Toilet: with modified independence;ambulating Pt Will Perform Toileting - Clothing Manipulation and hygiene: with modified independence;sit to/from stand;with adaptive equipment Additional ADL Goal #1: Pt will perform bed mobility at mod I level prior to participating in ADL activity  Plan Discharge plan remains appropriate    Co-evaluation                 AM-PAC PT "6 Clicks" Daily Activity     Outcome Measure   Help from another person eating meals?: None Help from another person taking care of personal grooming?: A Little Help from another person toileting, which includes using toliet, bedpan, or urinal?: A Lot Help from another person bathing (including washing, rinsing, drying)?: A Lot Help from another person to put on and taking off regular upper body clothing?: A Little Help from another person to put on and taking off regular lower body clothing?: A Lot 6 Click Score: 16    End of Session Equipment Utilized During Treatment: Gait belt;Rolling walker  OT Visit Diagnosis: Unsteadiness on feet (R26.81);Other abnormalities of gait and mobility (R26.89);Muscle weakness (generalized) (M62.81);Pain Pain - Right/Left: Left Pain - part of body: (back)   Activity Tolerance Patient limited by fatigue   Patient Left with call bell/phone within reach;in  chair;with chair alarm set   Nurse Communication Mobility status        Time: 1610-9604 OT Time Calculation (min): 29 min  Charges: OT General Charges $OT Visit: 1 Visit OT Treatments $Self Care/Home Management : 23-37 mins  Joseth Weigel MSOT, OTR/L Acute Rehab Pager: [phone] Office: [phone]   Theodoro Grist Annalisa Colonna 10/15/2017, 3:55 PM

## 2017-10-15 NOTE — Progress Notes (Signed)
Physical Therapy Treatment Patient Details Name: Marcus [name] MRN: [id number] DOB: 1966-04-20 Today's Date: 10/15/2017    History of Present Illness Marcus [name] is a 52 y.o. 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 Admitted forA/C diastolic HFwith elevated BNP and AKI. R heart cath 10/01/17    PT Comments    Pt with increase ambulation tolerance compared to yesterday. Encouraged pt to ambulate daily with RN staff. Pt's KI re-fitted for optimal support. Acute PT to con't to follow.   Follow Up Recommendations  CIR;Supervision/Assistance - 24 hour     Equipment Recommendations  None recommended by PT    Recommendations for Other Services Rehab consult     Precautions / Restrictions Precautions Precautions: Fall(enteric) Precaution Comments: L KI Required Braces or Orthoses: Knee Immobilizer - Left Knee Immobilizer - Left: On when out of bed or walking Restrictions Weight Bearing Restrictions: No    Mobility  Bed Mobility Overal bed mobility: Needs Assistance Bed Mobility: Supine to Sit     Supine to sit: HOB elevated;Supervision     General bed mobility comments: increased time, pt able to manage L LE in KI off EOB  Transfers Overall transfer level: Needs assistance Equipment used: Rolling walker (2 wheeled) Transfers: Sit to/from Stand Sit to Stand: Min assist         General transfer comment: pt performed sit to stand via pulling up on RW with PT holding walker down. encouraged to push up from bed however pt declined.  Ambulation/Gait Ambulation/Gait assistance: Min assist;+2 safety/equipment(for chair follow) Ambulation Distance (Feet): 60 Feet Assistive device: Rolling walker (2 wheeled) Gait Pattern/deviations: Step-to pattern;Decreased stride length;Decreased  stance time - left;Decreased weight shift to left;Trunk flexed Gait velocity: dec Gait velocity interpretation: <1.8 ft/sec, indicate of risk for recurrent falls General Gait Details: pt with significant WBing on bilat UEs to off weight L LE. pt with noted L drop foot which he has had for 20 years. focused on looking foward and using abdominal muscles to assist with maintaining upright position and L LE advancement. pt with increased trunk flexion   Stairs             Wheelchair Mobility    Modified Rankin (Stroke Patients Only)       Balance Overall balance assessment: Needs assistance Sitting-balance support: No upper extremity supported;Feet supported Sitting balance-Leahy Scale: Good     Standing balance support: Bilateral upper extremity supported Standing balance-Leahy Scale: Poor Standing balance comment: reliant on external support of RW                            Cognition Arousal/Alertness: Awake/alert Behavior During Therapy: WFL for tasks assessed/performed Overall Cognitive Status: Within Functional Limits for tasks assessed                                        Exercises      General Comments        Pertinent Vitals/Pain Pain Assessment: 0-10 Pain Score: 4  Pain Location: L knee Pain Descriptors / Indicators: Discomfort;Grimacing;Guarding Pain Intervention(s): Monitored during session    Home Living  Prior Function            PT Goals (current goals can now be found in the care plan section) Acute Rehab PT Goals Patient Stated Goal: to go home Progress towards PT goals: Progressing toward goals    Frequency    Min 3X/week      PT Plan Current plan remains appropriate    Co-evaluation              AM-PAC PT "6 Clicks" Daily Activity  Outcome Measure  Difficulty turning over in bed (including adjusting bedclothes, sheets and blankets)?: A Little Difficulty moving  from lying on back to sitting on the side of the bed? : A Little Difficulty sitting down on and standing up from a chair with arms (e.g., wheelchair, bedside commode, etc,.)?: Unable Help needed moving to and from a bed to chair (including a wheelchair)?: A Little Help needed walking in hospital room?: A Little Help needed climbing 3-5 steps with a railing? : Total 6 Click Score: 14    End of Session Equipment Utilized During Treatment: Gait belt;Left knee immobilizer Activity Tolerance: Patient tolerated treatment well Patient left: in chair;with chair alarm set;with call bell/phone within reach;Other (comment) Nurse Communication: Mobility status PT Visit Diagnosis: Unsteadiness on feet (R26.81);Difficulty in walking, not elsewhere classified (R26.2);Muscle weakness (generalized) (M62.81)     Time: 0930-1003 PT Time Calculation (min) (ACUTE ONLY): 33 min  Charges:  $Gait Training: 23-37 mins                    G Codes:       Lewis Shock, PT, DPT Pager #: [phone] Office #: [phone]    Shonia Skilling [address] 10/15/2017, 12:56 PM

## 2017-10-15 NOTE — Progress Notes (Signed)
Rehab admissions - Please see rehab consult done by Dr. Allena Katz 5/14 recommending SNF for slower paced rehab.  Call me for questions.  #[phone]

## 2017-10-15 NOTE — Progress Notes (Signed)
Nutrition Brief Note  RD pulled to pt chart due to LOS (day 15).   Wt Readings from Last 15 Encounters:  10/15/17 153 lb 14.4 oz (69.8 kg)  09/29/17 132 lb (59.9 kg)  09/18/17 135 lb (61.2 kg)  08/27/17 131 lb 13.4 oz (59.8 kg)  06/23/17 171 lb 8 oz (77.8 kg)  01/05/16 175 lb (79.4 kg)  12/30/15 180 lb (81.6 kg)  11/18/12 185 lb (83.9 kg)   Marcus [name] is a 52 y.o. 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 Admitted forA/C diastolic HFwith elevated BNP and AKI.   Pt sitting in recliner chair, with head resting on tray table.   Case discussed with RN, who reports pt with good appetite. Meal intake off trays has been minimal (PO: 25-50%), however, pt has been consuming outside food (mainly fast food). Noted two pizza boxes on tray table.   Wt hx reviewed. No wt loss over the past year.  Body mass index is 21.46 kg/m. Patient meets criteria for normal weight range based on current BMI.   Current diet order is dysphagia 2, patient is consuming approximately 25-50% of meals at this time. Labs and medications reviewed.   No nutrition interventions warranted at this time. If nutrition issues arise, please consult RD.   Adeena Bernabe A. Mayford Knife, RD, LDN, CDE Pager: [phone] After hours Pager: [phone]

## 2017-10-15 NOTE — Progress Notes (Signed)
Report called for transfer to 3E21; patient and all belongings to be transported shortly.

## 2017-10-16 ENCOUNTER — Ambulatory Visit: Payer: Self-pay | Admitting: Internal Medicine

## 2017-10-16 LAB — CBC
HCT: 29.1 % — ABNORMAL LOW (ref 39.0–52.0)
HEMOGLOBIN: 9.1 g/dL — AB (ref 13.0–17.0)
MCH: 24.5 pg — ABNORMAL LOW (ref 26.0–34.0)
MCHC: 31.3 g/dL (ref 30.0–36.0)
MCV: 78.2 fL (ref 78.0–100.0)
Platelets: 344 10*3/uL (ref 150–400)
RBC: 3.72 MIL/uL — ABNORMAL LOW (ref 4.22–5.81)
RDW: 17.9 % — AB (ref 11.5–15.5)
WBC: 11.5 10*3/uL — ABNORMAL HIGH (ref 4.0–10.5)

## 2017-10-16 LAB — BASIC METABOLIC PANEL
ANION GAP: 9 (ref 5–15)
BUN: 22 mg/dL — ABNORMAL HIGH (ref 6–20)
CALCIUM: 8.2 mg/dL — AB (ref 8.9–10.3)
CO2: 26 mmol/L (ref 22–32)
Chloride: 96 mmol/L — ABNORMAL LOW (ref 101–111)
Creatinine, Ser: 1.55 mg/dL — ABNORMAL HIGH (ref 0.61–1.24)
GFR calc Af Amer: 58 mL/min — ABNORMAL LOW (ref >60)
GFR, EST NON AFRICAN AMERICAN: 50 mL/min — AB (ref >60)
GLUCOSE: 105 mg/dL — AB (ref 65–99)
Potassium: 3.8 mmol/L (ref 3.5–5.1)
Sodium: 131 mmol/L — ABNORMAL LOW (ref 135–145)

## 2017-10-16 MED ORDER — FUROSEMIDE 40 MG PO TABS
40.0000 mg | ORAL_TABLET | Freq: Two times a day (BID) | ORAL | Status: DC
  Administered 2017-10-16 – 2017-10-20 (×9): 40 mg via ORAL
  Filled 2017-10-16 (×9): qty 1

## 2017-10-16 NOTE — Plan of Care (Signed)

## 2017-10-16 NOTE — Progress Notes (Signed)
Physical Therapy Treatment Patient Details Name: Marcus [name] MRN: [id number] DOB: 17-Apr-1966 Today's Date: 10/16/2017    History of Present Illness Marcus [name] is a 52 y.o. 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 Admitted forA/C diastolic HFwith elevated BNP and AKI. R heart cath 10/01/17    PT Comments    Patient reporting more fatigue this visit, ambulating 35' this session before requiring seated rest break. Pt says he is starting to feel better overall from C Diff, determined to regain more independence. Min A for transfers, min guard for ambulation with chair follow at this time. Updated recs to reflect SNF as CIR consult recommends less intense therapies.    Follow Up Recommendations  Supervision/Assistance - 24 hour;SNF     Equipment Recommendations  None recommended by PT    Recommendations for Other Services      Precautions / Restrictions Precautions Precautions: Fall Precaution Comments: L KI Required Braces or Orthoses: Knee Immobilizer - Left Knee Immobilizer - Left: On when out of bed or walking Restrictions Weight Bearing Restrictions: No    Mobility  Bed Mobility               General bed mobility comments: OOB at entry  Transfers Overall transfer level: Needs assistance Equipment used: Rolling walker (2 wheeled) Transfers: Sit to/from Stand Sit to Stand: Min assist         General transfer comment: Min A today to power RW. cues for hand placement  Ambulation/Gait Ambulation/Gait assistance: Min assist;+2 safety/equipment Ambulation Distance (Feet): 35 Feet Assistive device: Rolling walker (2 wheeled) Gait Pattern/deviations: Step-to pattern;Decreased stride length;Decreased stance time - left;Decreased weight shift to left;Trunk flexed Gait  velocity: decreased   General Gait Details: Pt overusing BUE support to offset weakness and pain in LLE. Fatigues easily, O2 sats 98% on RA. chair follow closley for safety. Cues for posture and improving mechanics.    Stairs             Wheelchair Mobility    Modified Rankin (Stroke Patients Only)       Balance Overall balance assessment: Needs assistance Sitting-balance support: No upper extremity supported;Feet supported Sitting balance-Leahy Scale: Good     Standing balance support: Bilateral upper extremity supported Standing balance-Leahy Scale: Poor Standing balance comment: reliant on external support of RW                            Cognition Arousal/Alertness: Awake/alert Behavior During Therapy: WFL for tasks assessed/performed Overall Cognitive Status: Within Functional Limits for tasks assessed                                 General Comments: Requiring encouragment      Exercises      General Comments        Pertinent Vitals/Pain Pain Assessment: Faces Faces Pain Scale: Hurts even more Pain Location: L knee Pain Descriptors / Indicators: Discomfort;Grimacing;Guarding Pain Intervention(s): Limited activity within patient's tolerance;Monitored during session;Repositioned;Premedicated before session    Home Living                      Prior Function            PT Goals (current goals  can now be found in the care plan section) Acute Rehab PT Goals Patient Stated Goal: to go home PT Goal Formulation: With patient Time For Goal Achievement: 10/16/17 Potential to Achieve Goals: Good Progress towards PT goals: Progressing toward goals    Frequency    Min 3X/week      PT Plan Current plan remains appropriate    Co-evaluation              AM-PAC PT "6 Clicks" Daily Activity  Outcome Measure  Difficulty turning over in bed (including adjusting bedclothes, sheets and blankets)?: A  Little Difficulty moving from lying on back to sitting on the side of the bed? : A Little Difficulty sitting down on and standing up from a chair with arms (e.g., wheelchair, bedside commode, etc,.)?: Unable Help needed moving to and from a bed to chair (including a wheelchair)?: A Little Help needed walking in hospital room?: A Little Help needed climbing 3-5 steps with a railing? : Total 6 Click Score: 14    End of Session Equipment Utilized During Treatment: Gait belt;Left knee immobilizer Activity Tolerance: Patient tolerated treatment well Patient left: in chair;with chair alarm set;with call bell/phone within reach;Other (comment) Nurse Communication: Mobility status PT Visit Diagnosis: Unsteadiness on feet (R26.81);Difficulty in walking, not elsewhere classified (R26.2);Muscle weakness (generalized) (M62.81)     Time: 1640-1700 PT Time Calculation (min) (ACUTE ONLY): 20 min  Charges:  $Gait Training: 8-22 mins                    G Codes:       Etta Grandchild, PT, DPT Acute Rehab Services Pager: [phone]     Etta Grandchild 10/16/2017, 5:01 PM

## 2017-10-16 NOTE — Care Management Note (Signed)
Case Management Note  Patient Details  Name: Marcus [name] MRN: [id number] Date of Birth: [birth year]  Subjective/Objective:     CHF              Action/Plan: 10/16/2017 - Patient had been admitted 3 times in 6 months; active with [facility]; CM will continue to follow for progression of care; Marcus [name] [phone]   Notes from previous admission documented by Marcus Lis RN CM Subjective/Objective:  Pt presented for Sepsis 2/2 HAP and New Onset CHF. Worsening of Mitral Regurgitation and Persistent Vegetation-ID following. Plan for dental extractions 08-21-17. Pt initially from [facility] on a Letter of Guarantee (LOG)  from the [name] and pt left. CSW following for disposition needs. CIR is following as well. Pt has support of mother and father.              Action/Plan: Pt continues on IV Ancef until 09-24-17. Pt was discussed in the QC meeting. Pt may be approved for LOG- will continue to monitor.  DME Arranged:  [name] Bed DME Agency:  [facility]  HH Arranged:  RN, PT [facility] Agency:  [name]  Additional Comments: 1025 08-27-17 Marcus Bamberger, RN,BSN [phone] CM did call Mom Marcus [name] in regards to discharge plan for son Marcus [name]. Mom was agitated that pt is being d/c home today 2/2 her husband is having surgery and she has to get him home as well. CM did try to be compassionate to the moms needs and offer other alternatives. CM did state that CAB could take patient to his address to pick up meds and maybe and his father could assist. Mom did not agree with this idea. CM also offered to call his siblings to see if they can assist and Mom stated believe it or not all of our family is having issues at this time. Mom was furious that pt was being d/c today and CM had discussed planning on 08-26-17. Plan will be for d/c home 08-27-17- Staff RN to call his mother at time of d/c so she can transport patient home. Mother refused [name] Bed and  she stated she will get the patient a lift chair. No further needs from CM at this time.   4010 08-27-17 Marcus Bamberger, RN,BSN [phone] CM did speak with [name]- Agency is not able to provide [name]. CM did call [facility] and they will provide the [name]. [facility] will check the cost to rent a [name] Bed and will get back to CM.  CM did call [employer] to make sure that Medication Dificid has been delivered. Spoke to Marcus [name]: Reference # [id number]. Pharmacy Approved and medication will be delivered today via UPS. No delivery time known. Mother will have to keep checking his address. [name] Bed will cost $128.00 a month- Order will need to be placed. No further needs from CM at this time.   1622 08-26-17 Marcus Bamberger, RN,BSN [phone] CM did speak with [employer] Rep and pt has been approved for Dificid- CM did fax information to [facility] and medication will be mailed overnight to patient's address. CM will mail information to  Po Box. CM is trying to assist patient with [name]- [facility] assesisng to see if pt qualifies for charity care. MD to place [name]. Mom notified of plan of care adnd plan will be for home @ his mothers address [address] [place] [address]. Per mtoher she has a 3n1, WC and RW. Mom wants a  [name] Bed- [name] is checking the price for rental for bed. Pt and mom aware that d/c day will be 08-27-17 and that medication will be sent to patient's address and someone will need to pick them up. Other medications can be sent to [facility]. Pt will be HRI with [facility] once stable for d/c. No further needs from CM at this time.  1552 08-26-17 Marcus Bamberger, RN, BSN [phone] CM did fax information to [employer] for patient assistance application. Awaiting to hear back from Company in regards to cost. Pt is not a candidate for CIR or SNF. Plan will be for home once stable -CM did contact [facility] to see if pt can be approved for [facility]  Services.   08-26-17 1328 Marcus [name], [place] [phone] [name] follow up scheduled at the [facility]. CM did call the [facility] to check the price for Dificid-cost will be $7431.00. CM did call the [facility]- and spoke with Marcus [name], Marcus [name] in the Pharmacy- she was able to send me information for the patient assistance [employer]. CM will ask MD to assist with Application in order to fax to company for fast track process to see if company can provide assistance. CM did call patient's mother and she wants CIR for 2 weeks- CM did explain that this is not an option and pt is not a candidate for CIR due to pt will not be able to tolerate the intensity of the Short Term Rehab. Pt's only option for SNF was [facility] when he was on IV antibiotic therapy. Therapy has been changed to po and not sure if he will be eligible for LOG for additional SNF. Mom is aware that if [name] can not do a LOG that the plan will be for home - mom has to decide if pt can come to her home vs his dads home or return to his address. CM & CSW working together to get pt safely transitioned out of the [name].   08-26-17 1112 Marcus [name], [place] [phone] Pt was discussed in the QC Meeting this am. ID following and converted IV antibiotics to Oral Cephalexin x 11 days and fidaxomicin (DIFICID) tablet 200 mg for 18 more days per ID notes.  CM will work with MD in regards to Rx's and try to assist with purchasing medications for patient. Pt is without PCP- CM did reach out to the [facility] to see if any new patient appointments available. [facility] may be willing to accept the patient at the clinic. [facility]- he will be able to get medications from the [facility] and medications will range from $4.00-$10.00. Plan will be for home. CM will reach out to the patient's mom for additional support.   08-22-17  1503 Marcus Bamberger, RN,BSN [phone] CM did speak with CIR- liaison Britta Mccreedy pt is not a candidate for CIR at this time. Plan will need to be for SNF. CSW assisting with disposition needs.  Gala Lewandowsky, RN 08/21/2017, 3:21 PM  Reola Mosher [phone] 10/16/2017, 12:43 PM

## 2017-10-16 NOTE — Progress Notes (Signed)
      [address]       [place] [address]             [phone]     CARDIOTHORACIC SURGERY PROGRESS NOTE  Subjective: Feels better.  Diarrhea improved.  Appetite improving.  Breathing stable  Objective: Vital signs in last 24 hours: Temp:  [98.2 F (36.8 C)-99 F (37.2 C)] 98.8 F (37.1 C) (05/16 0525) Pulse Rate:  [82-93] 84 (05/16 0525) Cardiac Rhythm: Normal sinus rhythm (05/16 0700) Resp:  [16-20] 16 (05/16 0525) BP: (89-103)/(59-74) 90/72 (05/16 0525) SpO2:  [92 %-96 %] 96 % (05/16 0525) Weight:  [157 lb 11.2 oz (71.5 kg)-158 lb (71.7 kg)] 158 lb (71.7 kg) (05/16 0525)  Physical Exam:  Rhythm:   sinus  Breath sounds: Diminished right base  Heart sounds:  RRR w/ holosystolic murmur  Incisions:  n/a  Abdomen:  Soft, mildly distended, non tender  Extremities:  Warm, well-perfused   Intake/Output from previous day: 05/15 0701 - 05/16 0700 In: 363 [P.O.:360; I.V.:3] Out: 600 [Urine:600] Intake/Output this shift: Total I/O In: -  Out: 300 [Urine:300]  Lab Results: Recent Labs    10/14/17 0305 10/16/17 0727  WBC 18.7* 11.5*  HGB 9.1* 9.1*  HCT 28.4* 29.1*  PLT 267 344   BMET:  Recent Labs    10/15/17 0229 10/16/17 0727  NA 129* 131*  K 3.2* 3.8  CL 92* 96*  CO2 25 26  GLUCOSE 106* 105*  BUN 20 22*  CREATININE 1.79* 1.55*  CALCIUM 8.0* 8.2*    CBG (last 3)  No results for input(s): GLUCAP in the last 72 hours. PT/INR:  No results for input(s): LABPROT, INR in the last 72 hours.  CXR:  N/A  Assessment/Plan:   Slowly improving and WBC has normalized.  Abdomen still distended but non tender.  If he continues to improve it's possible that it might be reasonable to proceed with mitral valve repair/replacement next week.  Will tentatively plan for 5/21   I spent in excess of 15 minutes during the conduct of this hospital encounter and >50% of this time involved direct face-to-face encounter with the patient for counseling and/or  coordination of their care.    Purcell Nails, MD 10/16/2017 4:42 PM

## 2017-10-16 NOTE — Progress Notes (Addendum)
Patient ID: GRYFFIN ALTICE, male   DOB: Mar 21, 1966, 52 y.o.   MRN: [id number]     Advanced Heart Failure Rounding Note  PCP-Cardiologist: Armanda Magic, MD  HF Cardiology: Shirlee Latch  Subjective:    PT/OT recommending CIR. Dr Allena Katz from rehab recommending slower paced SNF.  Sluggish UOP on PO lasix with -240 mls. Weight is up 1 lb. BMET pending.   Denies SOB or orthopnea. Had 3 BM's yesterday. Appetite is improving.   + C-Diff test 10/08/17. Started on Vancomycin.  Completing 6 weeks of vancomycin.  Started Flagyl 5/14.  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 + Diffuse colonic wall thickening consistent with colitis.   RHC 10/01/17 RHC Procedural Findings: Hemodynamics (mmHg) RA mean 11 RV 47/15 PA 49/25, mean 38 PCWP mean 19, v-waves to 35 Oxygen saturations: PA 52% AO 94% Cardiac Output (Fick) 4.35  Cardiac Index (Fick) 2.35  PVR 4.4 WU  Objective:   Weight Range: 158 lb (71.7 kg) Body mass index is 22.04 kg/m.   Vital Signs:   Temp:  [97.8 F (36.6 C)-99 F (37.2 C)] 98.8 F (37.1 C) (05/16 0525) Pulse Rate:  [81-93] 84 (05/16 0525) Resp:  [16-22] 16 (05/16 0525) BP: (87-103)/(59-74) 90/72 (05/16 0525) SpO2:  [92 %-97 %] 96 % (05/16 0525) Weight:  [157 lb 11.2 oz (71.5 kg)-158 lb (71.7 kg)] 158 lb (71.7 kg) (05/16 0525) Last BM Date: 10/15/17  Weight change: Filed Weights   10/15/17 0613 10/15/17 1736 10/16/17 0525  Weight: 153 lb 14.4 oz (69.8 kg) 157 lb 11.2 oz (71.5 kg) 158 lb (71.7 kg)    Intake/Output:   Intake/Output Summary (Last 24 hours) at 10/16/2017 0751 Last data filed at 10/16/2017 0509 Gross per 24 hour  Intake 363 ml  Output 600 ml  Net -237 ml      Physical Exam   General: No resp difficulty. Lying flat in bed HEENT: Normal Neck: Supple. JVP 7-8. Carotids 2+ bilat; no bruits. No thyromegaly or nodule noted. Cor: PMI nondisplaced. RRR, 3/6 HSM apex Lungs:  CTAB, normal effort. Abdomen: Soft, non-tender, non-distended, no HSM. No bruits or masses. +BS  Extremities: No cyanosis, clubbing, or rash. R and LLE 1+ edema with TED hose on Neuro: Alert & orientedx3, cranial nerves grossly intact. moves all 4 extremities w/o difficulty. Affect pleasant   Telemetry   NSR 80s. Personally reviewed.   Labs    CBC Recent Labs    10/14/17 0305  WBC 18.7*  HGB 9.1*  HCT 28.4*  MCV 79.8  PLT 267   Basic Metabolic Panel Recent Labs    40/98/11 0305 10/15/17 0229  NA 128* 129*  K 3.3* 3.2*  CL 93* 92*  CO2 22 25  GLUCOSE 111* 106*  BUN 19 20  CREATININE 1.49* 1.79*  CALCIUM 7.8* 8.0*  MG  --  1.7   Liver Function Tests No results for input(s): AST, ALT, ALKPHOS, BILITOT, PROT, ALBUMIN in the last 72 hours. No results for input(s): LIPASE, AMYLASE in the last 72 hours. Cardiac Enzymes No results for input(s): CKTOTAL, CKMB, CKMBINDEX, TROPONINI in the last 72 hours.  BNP: BNP (last 3 results) Recent Labs    08/09/17 1607 09/30/17 1500  BNP 449.0* 706.9*    ProBNP (last 3 results) Recent Labs    09/29/17 1350  PROBNP 13,809*     D-Dimer No results for input(s): DDIMER in the last 72 hours. Hemoglobin A1C No results for input(s):  HGBA1C in the last 72 hours. Fasting Lipid Panel No results for input(s): CHOL, HDL, LDLCALC, TRIG, CHOLHDL, LDLDIRECT in the last 72 hours. Thyroid Function Tests No results for input(s): TSH, T4TOTAL, T3FREE, THYROIDAB in the last 72 hours.  Invalid input(s): FREET3  Other results:   Imaging    No results found.   Medications:     Scheduled Medications: . amiodarone  200 mg Oral Daily  . chlorhexidine  60 mL Topical Once  . furosemide  40 mg Oral Daily  . gabapentin  300 mg Oral TID  . metroNIDAZOLE  500 mg Oral Q8H  . potassium chloride  40 mEq Oral Daily  . sodium chloride flush  3 mL Intravenous Q12H  . sodium chloride flush  3 mL Intravenous Q12H  . thiamine  100 mg  Oral Daily  . vancomycin  125 mg Oral QID   Followed by  . [START ON 10/22/2017] vancomycin  125 mg Oral BID   Followed by  . [START ON 10/30/2017] vancomycin  125 mg Oral Daily   Followed by  . [START ON 11/06/2017] vancomycin  125 mg Oral QODAY   Followed by  . [START ON 11/14/2017] vancomycin  125 mg Oral Q3 days    Infusions: . sodium chloride    . sodium chloride      PRN Medications: sodium chloride, sodium chloride, acetaminophen, iopamidol, ondansetron (ZOFRAN) IV, oxyCODONE, sodium chloride flush, sodium chloride flush   Patient Profile   MONG NEAL is a 52 y.o. 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   Assessment/Plan   1. Acute diastolic CHF: Due to severe mitral regurgitation from perforated anterior leaflet.  He was admitted markedly volume overloaded on exam.  Symptoms were baseline, NYHA class III, likely due to a combination of deconditioning and CHF.  Echo showed that LV EF remains normal at 55-60%.  However, RV has mild to moderate systolic dysfunction. RHC 10/01/17 mildly elevated PCWP and pulmonary venous hypertension. Relatively preserved CO. Creatinine trending up, 1.4>1.7.  - Volume status stable on exam. Continue 40 mg PO lasix daily.  - BMET pending this morning.  2. Severe mitral regurgitation: Due to prior endocarditis with MSSA. 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  - BP too soft to add afterload reduction. SBP ~90 - MV repair on hold with C-Diff colitis. 3. Aflutter with RVR:  Maintaining NSR.  -  Maintaining NSR. Continue amio 200 mg daily.  4. ID: MSSA bacteremia with MV endocarditis, CVA from septic emboli, septic  arthritis, and PNA.  He has had a full course of antibiotic treatment and PICC is now out.  Now with recurrent C difficile colitis.  - CBC pending. Afebrile.  - On flagyl.  - On po vanc for 6 weeks.    5. H/o septic arthritis: Left knee.  This significantly limits his mobility. No change.  6. Right pleural effusion: Moderate on CXR this admission.  Chronic, likely due to CHF.  7. Polysubstance abuse:  Prior use of opioids. Encouraged complete cessation.  8. AKI on CKD: Stage 3. Creatinine trendng up. 1.4>1.7. BMET pending this am. Now on PO lasix.  9. Diarrhea: He had C difficile back in March.  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  Stools seem to be slowing.  - Now on po vanc with taper per ID.  Also on flagyl.  10. Disposition - PT/OT recommending CIR. Continue PT daily.  - CIR recommending slower paced SNF - Possible MVR next Tuesday if C diff improves enough   Length of Stay: [address]  Alford Highland, NP  10/16/2017, 7:51 AM  Advanced Heart Failure Team Pager [phone] (M-F; 7a - 4p)  Please contact [facility] for night-coverage after hours (4p -7a ) and weekends on [email or web address]  Patient seen with NP, agree with the above note.  Gradually improving in terms of C difficile colitis.  Walked farther yesterday than he has in the past.  Denies dyspnea.    Still with JVD.  On po Lasix with rise in creatinine.  If creatinine stable today (still pending), will increase Lasix to 40 mg po bid.   Tentative plan for MV surgery next week.   Continue po vancomycin/Flagyl.   Marca Ancona 10/16/2017 8:44 AM

## 2017-10-16 NOTE — Progress Notes (Signed)
Patient less irritable, patient sat in chair for awhile, afebrile, and less frequent BM's.

## 2017-10-16 NOTE — Care Management Note (Addendum)
Case Management Note  Patient Details  Name: Marcus [name] MRN: [id number] Date of Birth: [birth year]  Subjective/Objective:  Pt admitted with HF, AKI and elevated BNP                  Action/Plan:  PTA from home with parent.  Pt previously admitted for endocarditis - plan is for heart cath today.  Pt is active with [facility] and has received information for orange card application from clinic.  Pt may require MATCH letter at discharge if he hasn't already used benefit this calendar year.  CM will continue to follow   Expected Discharge Date:                  Expected Discharge Plan:     In-House Referral:  Clinical Social Work  Discharge planning Services  CM Consult  Post Acute Care Choice:    Choice offered to:     DME Arranged:    DME Agency:     HH Arranged:    HH Agency:     Status of Service:     If discussed at [employer], dates discussed:    Additional Comments: 10/16/2017 CM discussed in LOS - pt remains appropriate for continued stay.  MVR was placed on hold due to C Diff infection.  MVR tentatively planned for next week (week of 5/20).  Pt remains on IV antibiotics and oral vanc for C Diff.  Discharge plan continues to be CIR/SNF  10/02/17 Plan is for pt to have MVR next week - pt continues to have intensity requiring continued stay. Cherylann Parr, RN 10/16/2017, 9:28 AM

## 2017-10-16 NOTE — Progress Notes (Signed)
Patient is alert and oriented soft b/p with no distress. Painful when walking from chronic back and leg pain. Po lasix, pain management therapy and cardiac surgery at a later date.

## 2017-10-16 NOTE — Progress Notes (Signed)
Patient is alert and oriented with father at the bedside, Patient is eating egg mcmuffins and hash browns for [employer], with Can of lemonade juice from outside the hospital. Gave teaching on heart health soft diet, no distress or swallowing issues, blood pressure soft.  Plan to continue po lasix, chronic back pain management and waiting for schedule for open heart surgery.

## 2017-10-17 LAB — BASIC METABOLIC PANEL
Anion gap: 10 (ref 5–15)
BUN: 19 mg/dL (ref 6–20)
CHLORIDE: 99 mmol/L — AB (ref 101–111)
CO2: 25 mmol/L (ref 22–32)
CREATININE: 1.61 mg/dL — AB (ref 0.61–1.24)
Calcium: 8.4 mg/dL — ABNORMAL LOW (ref 8.9–10.3)
GFR calc non Af Amer: 48 mL/min — ABNORMAL LOW (ref >60)
GFR, EST AFRICAN AMERICAN: 55 mL/min — AB (ref >60)
Glucose, Bld: 101 mg/dL — ABNORMAL HIGH (ref 65–99)
POTASSIUM: 3.5 mmol/L (ref 3.5–5.1)
SODIUM: 134 mmol/L — AB (ref 135–145)

## 2017-10-17 LAB — CBC
HEMATOCRIT: 30 % — AB (ref 39.0–52.0)
HEMOGLOBIN: 9.4 g/dL — AB (ref 13.0–17.0)
MCH: 24.7 pg — AB (ref 26.0–34.0)
MCHC: 31.3 g/dL (ref 30.0–36.0)
MCV: 78.7 fL (ref 78.0–100.0)
Platelets: 380 10*3/uL (ref 150–400)
RBC: 3.81 MIL/uL — AB (ref 4.22–5.81)
RDW: 18.4 % — ABNORMAL HIGH (ref 11.5–15.5)
WBC: 11.7 10*3/uL — AB (ref 4.0–10.5)

## 2017-10-17 NOTE — Progress Notes (Signed)
Patient is being followed by [facility] for [facility] services [facility]); Cory with [facility] made aware; CM will continue to follow for progression of care; B Shelba Flake [phone]

## 2017-10-17 NOTE — Progress Notes (Addendum)
Patient ID: Marcus [name], male   DOB: [birth year], 52 y.o.   MRN: [id number]     Advanced Heart Failure Rounding Note  PCP-Cardiologist: Armanda Magic, MD  HF Cardiology: Shirlee Latch  Subjective:    PT and Dr Allena Katz from rehab recommending SNF.   Improved UOP with -1.3 L. No weight yet this morning. Creatinine 1.61. WBC 11.7. SBP ~90.  No CP, SOB, dizziness. Appetite continues to improve. He had 3 loose BMs yesterday.   + C-Diff test 10/08/17. Started on Vancomycin.  Completing 6 weeks of vancomycin.  Started Flagyl 5/14.  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 + Diffuse colonic wall thickening consistent with colitis.   RHC 10/01/17 RHC Procedural Findings: Hemodynamics (mmHg) RA mean 11 RV 47/15 PA 49/25, mean 38 PCWP mean 19, v-waves to 35 Oxygen saturations: PA 52% AO 94% Cardiac Output (Fick) 4.35  Cardiac Index (Fick) 2.35  PVR 4.4 WU  Objective:   Weight Range: 158 lb (71.7 kg) Body mass index is 22.04 kg/m.   Vital Signs:   Temp:  [98.5 F (36.9 C)] 98.5 F (36.9 C) (05/16 2018) Pulse Rate:  [91] 91 (05/16 2018) Resp:  [18] 18 (05/16 2018) BP: (94)/(73) 94/73 (05/16 2018) SpO2:  [99 %] 99 % (05/16 2018) Last BM Date: 10/15/17  Weight change: Filed Weights   10/15/17 0613 10/15/17 1736 10/16/17 0525  Weight: 153 lb 14.4 oz (69.8 kg) 157 lb 11.2 oz (71.5 kg) 158 lb (71.7 kg)    Intake/Output:   Intake/Output Summary (Last 24 hours) at 10/17/2017 0751 Last data filed at 10/17/2017 4098 Gross per 24 hour  Intake -  Output 1250 ml  Net -1250 ml      Physical Exam   General:Sitting in chair. No resp difficulty. HEENT: Normal Neck: Supple. JVP ~10. Carotids 2+ bilat; no bruits. No thyromegaly or nodule noted. Cor: PMI nondisplaced. RRR, 3/6 HSM apex Lungs: diminished bases Abdomen: Soft, tender, non-distended, no HSM. No bruits or masses. +BS  Extremities: No cyanosis,  clubbing, or rash. R and LLE 1+ edema. BLE ted hose on Neuro: Alert & orientedx3, cranial nerves grossly intact. moves all 4 extremities w/o difficulty. Affect pleasant  Telemetry   NSR 70-80s. Personally reviewed.   Labs    CBC Recent Labs    10/16/17 0727 10/17/17 0620  WBC 11.5* 11.7*  HGB 9.1* 9.4*  HCT 29.1* 30.0*  MCV 78.2 78.7  PLT 344 380   Basic Metabolic Panel Recent Labs    11/91/47 0229 10/16/17 0727 10/17/17 0620  NA 129* 131* 134*  K 3.2* 3.8 3.5  CL 92* 96* 99*  CO2 GLUCOSE 106* 105* 101*  BUN 20 22* 19  CREATININE 1.79* 1.55* 1.61*  CALCIUM 8.0* 8.2* 8.4*  MG 1.7  --   --    Liver Function Tests No results for input(s): AST, ALT, ALKPHOS, BILITOT, PROT, ALBUMIN in the last 72 hours. No results for input(s): LIPASE, AMYLASE in the last 72 hours. Cardiac Enzymes No results for input(s): CKTOTAL, CKMB, CKMBINDEX, TROPONINI in the last 72 hours.  BNP: BNP (last 3 results) Recent Labs    08/09/17 1607 09/30/17 1500  BNP 449.0* 706.9*    ProBNP (last 3 results) Recent Labs    09/29/17 1350  PROBNP 13,809*     D-Dimer No results for input(s): DDIMER in the last 72 hours. Hemoglobin A1C No results for input(s): HGBA1C in the last 72  hours. Fasting Lipid Panel No results for input(s): CHOL, HDL, LDLCALC, TRIG, CHOLHDL, LDLDIRECT in the last 72 hours. Thyroid Function Tests No results for input(s): TSH, T4TOTAL, T3FREE, THYROIDAB in the last 72 hours.  Invalid input(s): FREET3  Other results:   Imaging    No results found.   Medications:     Scheduled Medications: . amiodarone  200 mg Oral Daily  . chlorhexidine  60 mL Topical Once  . furosemide  40 mg Oral BID  . gabapentin  300 mg Oral TID  . metroNIDAZOLE  500 mg Oral Q8H  . potassium chloride  40 mEq Oral Daily  . sodium chloride flush  3 mL Intravenous Q12H  . sodium chloride flush  3 mL Intravenous Q12H  . thiamine  100 mg Oral Daily  . vancomycin  125  mg Oral QID   Followed by  . [START ON 10/22/2017] vancomycin  125 mg Oral BID   Followed by  . [START ON 10/30/2017] vancomycin  125 mg Oral Daily   Followed by  . [START ON 11/06/2017] vancomycin  125 mg Oral QODAY   Followed by  . [START ON 11/14/2017] vancomycin  125 mg Oral Q3 days    Infusions: . sodium chloride    . sodium chloride      PRN Medications: sodium chloride, sodium chloride, acetaminophen, iopamidol, ondansetron (ZOFRAN) IV, oxyCODONE, sodium chloride flush, sodium chloride flush   Patient Profile   Marcus [name] is a 52 y.o. 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   Assessment/Plan   1. Acute diastolic CHF: Due to severe mitral regurgitation from perforated anterior leaflet.  He was admitted markedly volume overloaded on exam.  Symptoms were baseline, NYHA class III, likely due to a combination of deconditioning and CHF.  Echo showed that LV EF remains normal at 55-60%.  However, RV has mild to moderate systolic dysfunction. RHC 10/01/17 mildly elevated PCWP and pulmonary venous hypertension. Relatively preserved CO.  - Volume status okay.  - Continue 40 mg PO lasix BID.  2. Severe mitral regurgitation: Due to prior endocarditis with MSSA. 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  - BP too soft to add afterload reduction. SBP ~90 - MV repair on hold with C-Diff colitis. -> Tentative plan for MV repair/replacement 5/21 3. Aflutter with RVR:    -  Maintaining NSR. Continue amio 200 mg daily.  4. 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  He has had a full course of antibiotic treatment and PICC  is now out.  Now with recurrent C difficile colitis.  - WBC improved 11.7 this am.  - On flagyl.  - On po vanc for 6 weeks.    5. H/o septic arthritis: Left knee.  This significantly limits his mobility. Working with PT/OT. 6. Right pleural effusion: Moderate on CXR this admission.  Chronic, likely due to CHF. No change.  7. Polysubstance abuse:  Prior use of opioids. Encouraged complete cessation. No change.  8. AKI on CKD: Stage 3. Creatinine 1.4 >1.79 > 1.61 9. Diarrhea: He had C difficile back in March.  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  - Less frequent BMs, but still loose.  -  Now on po vanc with taper per ID.  Also on flagyl.  10. Disposition - Will need SNF at discharge.  - Tentative plan for MV repair/replacement on Tuesday, 5/21  Length of Stay: 17  Alford Highland, NP  10/17/2017, 7:51 AM  Advanced Heart Failure Team Pager [phone] (M-F; 7a - 4p)  Please contact [facility] for night-coverage after hours (4p -7a ) and weekends on [email or web address]  Patient seen with NP, agree with the above note.  Gradually improving in terms of C difficile colitis.  Still with 2-3 loose stools a day but WBCs have come down.   Still with mild JVD.  Continue Lasix 40 mg po bid.   Tentative plan for MV surgery next week.   Continue po vancomycin/Flagyl.   Marca Ancona 10/17/2017 11:22 AM

## 2017-10-17 NOTE — Clinical Social Work Note (Signed)
Discussed LOG with Chiropodist of social work. Patient discharge to [facility] [place] [facility] a few months ago and left AMA. Patient is not on any IV antibiotics and according to the PT evaluation, patient has 24/7 supervision at home from his parents. Chiropodist of social work has denied 30-day LOG unless there is a change in function after procedure on Tuesday. RNCM aware.  Charlynn Court, CSW [phone]

## 2017-10-18 LAB — BASIC METABOLIC PANEL
ANION GAP: 10 (ref 5–15)
BUN: 26 mg/dL — ABNORMAL HIGH (ref 6–20)
CO2: 25 mmol/L (ref 22–32)
Calcium: 8.4 mg/dL — ABNORMAL LOW (ref 8.9–10.3)
Chloride: 100 mmol/L — ABNORMAL LOW (ref 101–111)
Creatinine, Ser: 1.59 mg/dL — ABNORMAL HIGH (ref 0.61–1.24)
GFR calc non Af Amer: 48 mL/min — ABNORMAL LOW (ref >60)
GFR, EST AFRICAN AMERICAN: 56 mL/min — AB (ref >60)
Glucose, Bld: 97 mg/dL (ref 65–99)
POTASSIUM: 3.5 mmol/L (ref 3.5–5.1)
Sodium: 135 mmol/L (ref 135–145)

## 2017-10-18 LAB — CBC
HEMATOCRIT: 29.3 % — AB (ref 39.0–52.0)
HEMOGLOBIN: 9.3 g/dL — AB (ref 13.0–17.0)
MCH: 24.7 pg — AB (ref 26.0–34.0)
MCHC: 31.7 g/dL (ref 30.0–36.0)
MCV: 77.9 fL — ABNORMAL LOW (ref 78.0–100.0)
Platelets: 394 10*3/uL (ref 150–400)
RBC: 3.76 MIL/uL — AB (ref 4.22–5.81)
RDW: 18.6 % — ABNORMAL HIGH (ref 11.5–15.5)
WBC: 14.1 10*3/uL — ABNORMAL HIGH (ref 4.0–10.5)

## 2017-10-18 MED ORDER — POTASSIUM CHLORIDE 20 MEQ/15ML (10%) PO SOLN
40.0000 meq | Freq: Two times a day (BID) | ORAL | Status: DC
  Administered 2017-10-18 – 2017-10-19 (×2): 40 meq via ORAL
  Filled 2017-10-18 (×2): qty 30

## 2017-10-18 NOTE — Progress Notes (Addendum)
Progress Note  Patient Name: Marcus [name] Date of Encounter: 10/18/2017  Primary Cardiologist: Armanda Magic, MD   Subjective   Denies any chest pain or SOB.  Tested positive for [name] 10/18/2017 on IV Vanco and Flagyl.  Inpatient Medications    Scheduled Meds: . amiodarone  200 mg Oral Daily  . chlorhexidine  60 mL Topical Once  . furosemide  40 mg Oral BID  . gabapentin  300 mg Oral TID  . metroNIDAZOLE  500 mg Oral Q8H  . potassium chloride  40 mEq Oral Daily  . sodium chloride flush  3 mL Intravenous Q12H  . sodium chloride flush  3 mL Intravenous Q12H  . thiamine  100 mg Oral Daily  . vancomycin  125 mg Oral QID   Followed by  . [START ON 10/22/2017] vancomycin  125 mg Oral BID   Followed by  . [START ON 10/30/2017] vancomycin  125 mg Oral Daily   Followed by  . [START ON 11/06/2017] vancomycin  125 mg Oral QODAY   Followed by  . [START ON 11/14/2017] vancomycin  125 mg Oral Q3 days   Continuous Infusions: . sodium chloride    . sodium chloride     PRN Meds: sodium chloride, sodium chloride, acetaminophen, iopamidol, ondansetron (ZOFRAN) IV, oxyCODONE, sodium chloride flush, sodium chloride flush   Vital Signs    Vitals:   10/17/17 2016 10/17/17 2023 10/18/17 0507 10/18/17 0856  BP: (!) 91/65  Pulse: 89  90 86  Resp: 20  16   Temp: 98.6 F (37 C)  99.5 F (37.5 C)   TempSrc: Oral  Oral   SpO2: 93%  92% 94%  Weight:   162 lb 1.6 oz (73.5 kg)   Height:        Intake/Output Summary (Last 24 hours) at 10/18/2017 1158 Last data filed at 10/18/2017 0901 Gross per 24 hour  Intake 1665 ml  Output 1575 ml  Net 90 ml   Filed Weights   10/16/17 0525 10/17/17 0804 10/18/17 0507  Weight: 158 lb (71.7 kg) 160 lb 3.2 oz (72.7 kg) 162 lb 1.6 oz (73.5 kg)    Telemetry    NSR - Personally Reviewed  ECG    No new EKG to review - Personally Reviewed  Physical Exam   Currently on the commode so PE not performed  Labs     Chemistry Recent Labs  Lab 10/16/17 0727 10/17/17 0620 10/18/17 0614  NA 131* 134* 135  K 3.8 3.5 3.5  CL 96* 99* 100*  CO2 GLUCOSE 105* 101* 97  BUN 22* 19 26*  CREATININE 1.55* 1.61* 1.59*  CALCIUM 8.2* 8.4* 8.4*  GFRNONAA 50* 48* 48*  GFRAA 58* 55* 56*  ANIONGAP Hematology Recent Labs  Lab 10/16/17 0727 10/17/17 0620 10/18/17 0614  WBC 11.5* 11.7* 14.1*  RBC 3.72* 3.81* 3.76*  HGB 9.1* 9.4* 9.3*  HCT 29.1* 30.0* 29.3*  MCV 78.2 78.7 77.9*  MCH 24.5* 24.7* 24.7*  MCHC 31.3 31.3 31.7  RDW 17.9* 18.4* 18.6*  PLT 344 380 394    Cardiac EnzymesNo results for input(s): TROPONINI in the last 168 hours. No results for input(s): TROPIPOC in the last 168 hours.   BNPNo results for input(s): BNP, PROBNP in the last 168 hours.   DDimer No results for input(s): DDIMER in the last 168 hours.   Radiology    No results found.  Cardiac Studies     Patient Profile     52 y.o. 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   Assessment & Plan    1. Acute diastolic CHF: Due to severe mitral regurgitation from perforated anterior leaflet. He was admitted markedly volume overloaded on exam. Symptoms were baseline, NYHA class III, likely due to a combination of deconditioning and CHF. Echo showed that LV EF remains normal at 55-60%. However, RV has mild to moderate systolic dysfunction. RHC 10/01/17 mildly elevated PCWP and pulmonary venous hypertension. Relatively preserved CO.  - Volume status okay.  -Continues to diurese well on p.o. Lasix.  He put out 1.6 L yesterday and is net negative for 97 cc. - Continue 40 mg PO lasix BID.   2. Severe mitral regurgitation: Due to prior  endocarditis with MSSA. 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  - BP too soft to add afterload reduction.  BP 91/65 mmHg this morning - MV repair on hold with C-Diff colitis. -> Tentative plan for MV repair/replacement 5/21  3. Aflutter with RVR:    -  Maintaining NSR. Continue amio 200 mg daily.   4. 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 He has had a full course of antibiotic treatment and PICC is now out.  Now with recurrent C difficile colitis.  - WBC improved 11.7 this am.  - On flagyl.  - On po vanc for 6 weeks.     5. H/o septic arthritis: Left knee. This significantly limits his mobility. Working with PT/OT.  6. Right pleural effusion: Moderate on CXR this admission. Chronic, likely due to CHF. No change.   7. Polysubstance abuse:  Prior use of opioids.Encouraged complete cessation. No change.   8. AKI on CKD: Stage 3.  -Stable with creatinine 1.4 >1.79 > 1.61> 1.59  9. Diarrhea: He had C difficile back in March.  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  - Less frequent BMs, but still loose.  - Now on po vanc with taper per ID.  Also on flagyl.   10. Disposition - Will need SNF at discharge.  - Tentative plan for MV repair/replacement on Tuesday, 5/21  11.  Hypokalemia -K+ borderline low at 3.5 -Increase K-Dur to 40 mEq twice daily    For questions or updates, please contact [facility] Please consult [email or web address] for contact info under Cardiology/STEMI.      Signed, Armanda Magic, MD  10/18/2017, 11:58 AM

## 2017-10-19 ENCOUNTER — Inpatient Hospital Stay (HOSPITAL_COMMUNITY): Payer: Medicaid Other

## 2017-10-19 LAB — CBC
HEMATOCRIT: 28.9 % — AB (ref 39.0–52.0)
Hemoglobin: 9 g/dL — ABNORMAL LOW (ref 13.0–17.0)
MCH: 24.1 pg — ABNORMAL LOW (ref 26.0–34.0)
MCHC: 31.1 g/dL (ref 30.0–36.0)
MCV: 77.3 fL — ABNORMAL LOW (ref 78.0–100.0)
PLATELETS: 371 10*3/uL (ref 150–400)
RBC: 3.74 MIL/uL — ABNORMAL LOW (ref 4.22–5.81)
RDW: 18.8 % — AB (ref 11.5–15.5)
WBC: 13.8 10*3/uL — AB (ref 4.0–10.5)

## 2017-10-19 LAB — BASIC METABOLIC PANEL
Anion gap: 10 (ref 5–15)
BUN: 28 mg/dL — AB (ref 6–20)
CALCIUM: 8.3 mg/dL — AB (ref 8.9–10.3)
CO2: 25 mmol/L (ref 22–32)
CREATININE: 1.36 mg/dL — AB (ref 0.61–1.24)
Chloride: 101 mmol/L (ref 101–111)
GFR calc Af Amer: >60 mL/min (ref >60)
GFR, EST NON AFRICAN AMERICAN: 58 mL/min — AB (ref >60)
GLUCOSE: 99 mg/dL (ref 65–99)
POTASSIUM: 3.4 mmol/L — AB (ref 3.5–5.1)
SODIUM: 136 mmol/L (ref 135–145)

## 2017-10-19 MED ORDER — POTASSIUM CHLORIDE CRYS ER 20 MEQ PO TBCR: 40.0000 meq | EXTENDED_RELEASE_TABLET | Freq: Every day | ORAL | Status: DC

## 2017-10-19 MED ORDER — POTASSIUM CHLORIDE CRYS ER 20 MEQ PO TBCR
40.0000 meq | EXTENDED_RELEASE_TABLET | Freq: Every day | ORAL | Status: DC
  Administered 2017-10-20 – 2017-10-22 (×3): 40 meq via ORAL
  Filled 2017-10-19 (×3): qty 2

## 2017-10-19 NOTE — Progress Notes (Addendum)
Progress Note  Patient Name: Marcus [name] Date of Encounter: 10/19/2017  Primary Cardiologist: Armanda Magic, MD   Subjective   Denies any chest pain or SOB.  Still has some diarrhea but it is more formed.  Says belly is more tense.  WBC bumped to 14.1 yesterday but back down to 13.8 today.  Inpatient Medications    Scheduled Meds: . amiodarone  200 mg Oral Daily  . chlorhexidine  60 mL Topical Once  . furosemide  40 mg Oral BID  . gabapentin  300 mg Oral TID  . metroNIDAZOLE  500 mg Oral Q8H  . potassium chloride  40 mEq Oral BID  . sodium chloride flush  3 mL Intravenous Q12H  . sodium chloride flush  3 mL Intravenous Q12H  . thiamine  100 mg Oral Daily  . vancomycin  125 mg Oral QID   Followed by  . [START ON 10/22/2017] vancomycin  125 mg Oral BID   Followed by  . [START ON 10/30/2017] vancomycin  125 mg Oral Daily   Followed by  . [START ON 11/06/2017] vancomycin  125 mg Oral QODAY   Followed by  . [START ON 11/14/2017] vancomycin  125 mg Oral Q3 days   Continuous Infusions: . sodium chloride    . sodium chloride     PRN Meds: sodium chloride, sodium chloride, acetaminophen, iopamidol, ondansetron (ZOFRAN) IV, oxyCODONE, sodium chloride flush, sodium chloride flush   Vital Signs    Vitals:   10/18/17 0856 10/18/17 2026 10/19/17 0602 10/19/17 1100  BP: 98/77  Pulse: 86 93 88 89  Resp:  Temp:  99.4 F (37.4 C) 98.9 F (37.2 C) 98.5 F (36.9 C)  TempSrc:  Oral Oral Oral  SpO2: 94% 91% 93% 94%  Weight:   164 lb (74.4 kg)   Height:        Intake/Output Summary (Last 24 hours) at 10/19/2017 1122 Last data filed at 10/19/2017 1105 Gross per 24 hour  Intake 1545 ml  Output 2575 ml  Net -1030 ml   Filed Weights   10/17/17 0804 10/18/17 0507 10/19/17 0602  Weight: 160 lb 3.2 oz (72.7 kg) 162 lb 1.6 oz (73.5 kg) 164 lb (74.4 kg)    Telemetry    NSR - Personally Reviewed  ECG    No new EKG to review - Personally  Reviewed  Physical Exam   GEN: No acute distress.   Neck: No JVD Cardiac: RRR, no rubs, or gallops. 2/6 SM at LLSB to apex Respiratory: Clear to auscultation bilaterally. ZO:XWRU but nontender MS: No edema; No deformity. Neuro:  Nonfocal  Psych: Normal affect   Labs    Chemistry Recent Labs  Lab 10/17/17 0620 10/18/17 0614 10/19/17 0717  NA 134* 135 136  K 3.5 3.5 3.4*  CL 99* 100* 101  CO2 GLUCOSE 101* 97 99  BUN 19 26* 28*  CREATININE 1.61* 1.59* 1.36*  CALCIUM 8.4* 8.4* 8.3*  GFRNONAA 48* 48* 58*  GFRAA 55* 56* >60  ANIONGAP Hematology Recent Labs  Lab 10/17/17 0620 10/18/17 0614 10/19/17 0717  WBC 11.7* 14.1* 13.8*  RBC 3.81* 3.76* 3.74*  HGB 9.4* 9.3* 9.0*  HCT 30.0* 29.3* 28.9*  MCV 78.7 77.9* 77.3*  MCH 24.7* 24.7* 24.1*  MCHC 31.3 31.7 31.1  RDW 18.4* 18.6* 18.8*  PLT 380 394 371    Cardiac EnzymesNo results for input(s): TROPONINI in  the last 168 hours. No results for input(s): TROPIPOC in the last 168 hours.   BNPNo results for input(s): BNP, PROBNP in the last 168 hours.   DDimer No results for input(s): DDIMER in the last 168 hours.   Radiology    No results found.  Cardiac Studies   2D echo 09/29/2017 Study Conclusions  - Left ventricle: The cavity size was mildly dilated. Wall   thickness was increased in a pattern of mild LVH. Systolic   function was normal. The estimated ejection fraction was in the   range of 55% to 60%. - Mitral valve: MV is thickened. Anterior mitral leaflet is shaggy   Suspicious for mobile echodensiity (vegetation) There is   torrential MR. - Left atrium: The atrium was moderately dilated. - Right ventricle: The cavity size was mildly dilated. Systolic   function was mildly to moderately reduced. - Right atrium: The atrium was mildly dilated. - Pulmonic valve: There was moderate regurgitation. - Pericardium, extracardiac: A small pericardial effusion was   identified.  Patient  Profile     52 y.o. 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    Assessment & Plan    1. Acute diastolic CHF: Due to severe mitral regurgitation from perforated anterior leaflet. He was admitted markedly volume overloaded on exam. Symptoms were baseline, NYHA class III, likely due to a combination of deconditioning and CHF. Echo showed that LV EF remains normal at 55-60%. However, RV has mild to moderate systolic dysfunction. RHC 10/01/17 mildly elevated PCWP and pulmonary venous hypertension. Relatively preserved CO.  - Volume statusokay. -Continues to diurese well on p.o. Lasix.  He put out 2.8 L yesterday and is net negative for 2L - Continue 40 mg PO lasix BID.  2. Severe mitral regurgitation:Due to prior endocarditis with MSSA. 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  - BP too soft to add afterload reduction.  BP 98/77 mmHg this morning - MV repair on hold with C-Diff colitis.-> Tentative plan for MV repair/replacement 5/21  3. Aflutter with RVR:  -MaintainingNSR. Continue amio 200 mg daily.   4. ID: MSSA bacteremia with MV endocarditis, CVA from septic emboli, septic arthritis, and PNA.He has had a full course of antibiotic treatment and PICC is now out. Now with recurrent C difficile colitis.  -WBC increased yesterday to 14.1 but trending down to 13.8 - On flagyl.  - On po vanc for 6 weeks.   5. H/o septic arthritis: Left knee. This significantly limits his mobility.Working with PT/OT.  6. Right pleural effusion:Moderate on CXR this admission. Chronic, likely due to CHF.No change.  7. Polysubstance abuse:Prior use of  opioids.Encouraged complete cessation.No change.  8. AKI on CKD: Stage 3. -Stable with creatinine1.4 >1.79 > 1.61> 1.59>1.36  9. Diarrhea: He had C difficile back in March. 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  - Less frequent BMs, but still loose. - Now on po vanc with taper per ID. Also on flagyl.  - increased yesterday and remains elevated at 13.8 and abdomen is more firm - will get GI input  10. Disposition - Will need SNF at discharge.  - Tentative plan for MV repair/replacement on Tuesday, 5/21  11.  Hypokalemia -K+ borderline low at 3.4 today despite increasing potassium to  BID -Increased potassium liquid to 40 mEq twice daily yesterday but it is in liquid form and he has not been taking it - will change Kdur PO daily tablet and reassess BMET in am.   For questions or updates, please contact [facility] Please consult [email or web address] for contact info under Cardiology/STEMI.      Signed, Armanda Magic, MD  10/19/2017, 11:22 AM
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
## 2017-10-19 NOTE — Progress Notes (Signed)
Pt called for assistance, he was on bsc and says he needs new gown and asssited with clean up as pt had BM and gown fell in toilet. Pt had large soft formed bm, gown changed and pt has large hemorrhoid noted, offered to get tucks etc and pt declined " why did I go down to that xray. Nobody told me" I reminded him that GI came and saw him and he did not want sigmoid done so he ordered xray."if they dont want to do this surgery or they cancel it then I am out of here" I told him he could talk to surgeon tomorrow as they would let him know the plan. When pt was getting up to the bedside commode,  He yells out his monitor got caught and yelled loudly " I am done and over all of this" there was EMS delivering pt next door and RT who came in to make sure we were ok. I advised we were and pt apologized " I am just so tired of all of this" allowed pt to verbalize feelings

## 2017-10-20 ENCOUNTER — Other Ambulatory Visit (HOSPITAL_COMMUNITY): Payer: Self-pay

## 2017-10-20 ENCOUNTER — Inpatient Hospital Stay (HOSPITAL_COMMUNITY): Payer: Medicaid Other

## 2017-10-20 ENCOUNTER — Encounter: Payer: Self-pay | Admitting: Thoracic Surgery (Cardiothoracic Vascular Surgery)

## 2017-10-20 LAB — BASIC METABOLIC PANEL
ANION GAP: 11 (ref 5–15)
BUN: 28 mg/dL — ABNORMAL HIGH (ref 6–20)
CALCIUM: 8.5 mg/dL — AB (ref 8.9–10.3)
CO2: 24 mmol/L (ref 22–32)
CREATININE: 1.65 mg/dL — AB (ref 0.61–1.24)
Chloride: 100 mmol/L — ABNORMAL LOW (ref 101–111)
GFR calc non Af Amer: 46 mL/min — ABNORMAL LOW (ref >60)
GFR, EST AFRICAN AMERICAN: 54 mL/min — AB (ref >60)
Glucose, Bld: 129 mg/dL — ABNORMAL HIGH (ref 65–99)
Potassium: 3.4 mmol/L — ABNORMAL LOW (ref 3.5–5.1)
SODIUM: 135 mmol/L (ref 135–145)

## 2017-10-20 LAB — TYPE AND SCREEN
ABO/RH(D): O POS
Antibody Screen: NEGATIVE

## 2017-10-20 LAB — CBC
HCT: 30.1 % — ABNORMAL LOW (ref 39.0–52.0)
Hemoglobin: 9.5 g/dL — ABNORMAL LOW (ref 13.0–17.0)
MCH: 24.8 pg — AB (ref 26.0–34.0)
MCHC: 31.6 g/dL (ref 30.0–36.0)
MCV: 78.6 fL (ref 78.0–100.0)
PLATELETS: 378 10*3/uL (ref 150–400)
RBC: 3.83 MIL/uL — AB (ref 4.22–5.81)
RDW: 19.3 % — ABNORMAL HIGH (ref 11.5–15.5)
WBC: 12.3 10*3/uL — ABNORMAL HIGH (ref 4.0–10.5)

## 2017-10-20 LAB — PROTIME-INR
INR: 1.43
Prothrombin Time: 17.3 seconds — ABNORMAL HIGH (ref 11.4–15.2)

## 2017-10-20 LAB — APTT: APTT: 35 s (ref 24–36)

## 2017-10-20 MED ORDER — POTASSIUM CHLORIDE 2 MEQ/ML IV SOLN
80.0000 meq | INTRAVENOUS | Status: DC
  Filled 2017-10-20: qty 40

## 2017-10-20 MED ORDER — MILRINONE LACTATE IN DEXTROSE 20-5 MG/100ML-% IV SOLN
0.1250 ug/kg/min | INTRAVENOUS | Status: DC
  Filled 2017-10-20: qty 100

## 2017-10-20 MED ORDER — MAGNESIUM SULFATE 50 % IJ SOLN
40.0000 meq | INTRAMUSCULAR | Status: DC
  Filled 2017-10-20: qty 9.85

## 2017-10-20 MED ORDER — NITROGLYCERIN IN D5W 200-5 MCG/ML-% IV SOLN
2.0000 ug/min | INTRAVENOUS | Status: DC
  Filled 2017-10-20: qty 250

## 2017-10-20 MED ORDER — DOPAMINE-DEXTROSE 3.2-5 MG/ML-% IV SOLN
0.0000 ug/kg/min | INTRAVENOUS | Status: DC
  Filled 2017-10-20 (×2): qty 250

## 2017-10-20 MED ORDER — KENNESTONE BLOOD CARDIOPLEGIA (KBC) MANNITOL SYRINGE (20%, 32ML)
32.0000 mL | Freq: Once | INTRAVENOUS | Status: DC
Start: 2017-10-21 — End: 2017-10-21
  Filled 2017-10-20: qty 32

## 2017-10-20 MED ORDER — EPINEPHRINE PF 1 MG/ML IJ SOLN
0.0000 ug/min | INTRAMUSCULAR | Status: DC
  Filled 2017-10-20: qty 4

## 2017-10-20 MED ORDER — SODIUM CHLORIDE 0.9 % IV SOLN
750.0000 mg | INTRAVENOUS | Status: DC
  Filled 2017-10-20: qty 750

## 2017-10-20 MED ORDER — PAPAVERINE HCL 30 MG/ML IJ SOLN
INTRAMUSCULAR | Status: DC
  Filled 2017-10-20: qty 2.5

## 2017-10-20 MED ORDER — DEXMEDETOMIDINE HCL IN NACL 400 MCG/100ML IV SOLN
0.1000 ug/kg/h | INTRAVENOUS | Status: DC
  Filled 2017-10-20: qty 100

## 2017-10-20 MED ORDER — EPINEPHRINE PF 1 MG/ML IJ SOLN
0.0000 ug/min | INTRAVENOUS | Status: DC
  Filled 2017-10-20 (×2): qty 4

## 2017-10-20 MED ORDER — POTASSIUM CHLORIDE CRYS ER 20 MEQ PO TBCR
40.0000 meq | EXTENDED_RELEASE_TABLET | Freq: Once | ORAL | Status: AC
  Administered 2017-10-20: 40 meq via ORAL
  Filled 2017-10-20: qty 2

## 2017-10-20 MED ORDER — KENNESTONE BLOOD CARDIOPLEGIA VIAL
13.0000 mL | Freq: Once | Status: DC
  Filled 2017-10-20: qty 13

## 2017-10-20 MED ORDER — NOREPINEPHRINE 4 MG/250ML-% IV SOLN
0.0000 ug/min | INTRAVENOUS | Status: DC
  Filled 2017-10-20: qty 250

## 2017-10-20 MED ORDER — VANCOMYCIN HCL 10 G IV SOLR
1250.0000 mg | INTRAVENOUS | Status: DC
  Filled 2017-10-20: qty 1250

## 2017-10-20 MED ORDER — DOPAMINE-DEXTROSE 3.2-5 MG/ML-% IV SOLN
0.0000 ug/kg/min | INTRAVENOUS | Status: DC
  Filled 2017-10-20: qty 250

## 2017-10-20 MED ORDER — TRANEXAMIC ACID (OHS) BOLUS VIA INFUSION
15.0000 mg/kg | INTRAVENOUS | Status: DC
  Filled 2017-10-20: qty 1106

## 2017-10-20 MED ORDER — PHENYLEPHRINE HCL 10 MG/ML IJ SOLN
30.0000 ug/min | INTRAMUSCULAR | Status: DC
  Filled 2017-10-20: qty 2

## 2017-10-20 MED ORDER — TRANEXAMIC ACID 1000 MG/10ML IV SOLN
1.5000 mg/kg/h | INTRAVENOUS | Status: DC
  Filled 2017-10-20: qty 25

## 2017-10-20 MED ORDER — SODIUM CHLORIDE 0.9 % IV SOLN
INTRAVENOUS | Status: DC
  Filled 2017-10-20: qty 30

## 2017-10-20 MED ORDER — SODIUM CHLORIDE 0.9 % IV SOLN
INTRAVENOUS | Status: DC
  Filled 2017-10-20: qty 1

## 2017-10-20 MED ORDER — FUROSEMIDE 10 MG/ML IJ SOLN
80.0000 mg | Freq: Once | INTRAMUSCULAR | Status: AC
  Administered 2017-10-20: 80 mg via INTRAVENOUS
  Filled 2017-10-20: qty 8

## 2017-10-20 MED ORDER — SODIUM CHLORIDE 0.9 % IV SOLN
INTRAVENOUS | Status: DC
  Filled 2017-10-20: qty 1000

## 2017-10-20 MED ORDER — SODIUM CHLORIDE 0.9 % IV SOLN
1.5000 g | INTRAVENOUS | Status: DC
  Filled 2017-10-20: qty 1.5

## 2017-10-20 MED ORDER — TRANEXAMIC ACID (OHS) PUMP PRIME SOLUTION
2.0000 mg/kg | INTRAVENOUS | Status: DC
Start: 2017-10-21 — End: 2017-10-21
  Filled 2017-10-20: qty 1.47

## 2017-10-20 MED ORDER — SODIUM CHLORIDE 0.9 % IV SOLN
30.0000 ug/min | INTRAVENOUS | Status: DC
  Filled 2017-10-20: qty 2

## 2017-10-20 MED ORDER — KENNESTONE BLOOD CARDIOPLEGIA (KBC) MANNITOL SYRINGE (20%, 32ML)
32.0000 mL | Freq: Once | INTRAVENOUS | Status: DC
  Filled 2017-10-20: qty 32

## 2017-10-20 NOTE — Progress Notes (Signed)
Physical Therapy Treatment Patient Details Name: Marcus [name] MRN: [id number] DOB: [birth year] Today's Date: 10/20/2017    History of Present Illness Marcus [name] is a 52 y.o. 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 Admitted forA/C diastolic HFwith elevated BNP and AKI. R heart cath 10/01/17    PT Comments    Pt reports increased low back pain but willing to participate with therapy today. Session focused on sit>stand transfers and gait. Pt reports decreased L knee discomfort with knee immobilizer donned and should be used when pt mobilizing out of bed. Overall, pt mobility is decreasing as evidenced by limitation with ambulation to 14 feet before requiring a seated rest break compared to 35 ft in previous sessions. Pt is scheduled for cardiac surgery 10-21-17  Follow Up Recommendations  Supervision/Assistance - 24 hour;SNF     Equipment Recommendations  None recommended by PT    Recommendations for Other Services       Precautions / Restrictions Precautions Precautions: Fall Precaution Comments: KI per pt request Required Braces or Orthoses: Knee Immobilizer - Left Knee Immobilizer - Left: On when out of bed or walking Restrictions Weight Bearing Restrictions: No    Mobility  Bed Mobility Overal bed mobility: Needs Assistance Bed Mobility: Supine to Sit     Supine to sit: Supervision     General bed mobility comments: increased time  Transfers Overall transfer level: Needs assistance   Transfers: Sit to/from Stand Sit to Stand: Min assist         General transfer comment: assist to stabilize RW. Pt unable to stand on first attempt, 2nd attempt used foot board and stabilized RW with assist to stand and stabilize, 3rd trial from recliner with min assist only to stabilize RW  as pt pulled on it to stand  Ambulation/Gait Ambulation/Gait assistance: Min assist;+2 safety/equipment Ambulation Distance (Feet): 14 Feet Assistive device: Rolling walker (2 wheeled) Gait Pattern/deviations: Step-to pattern;Decreased stride length;Decreased stance time - left;Decreased weight shift to left;Trunk flexed;Decreased dorsiflexion - left   Gait velocity interpretation: <1.31 ft/sec, indicative of household ambulator General Gait Details: reliant on bil UE with flexed posture, very limited stance on LLE with foot drop. pt initially able to increase posture with gait but with fatigue flexes trunk more. Pt ambulated 14 ft from bed>door and requested to sit. Pt then performed 2nd ambulation trial of 14 ft with pt reporting "i'm done"   Stairs             Wheelchair Mobility    Modified Rankin (Stroke Patients Only)       Balance Overall balance assessment: Needs assistance   Sitting balance-Leahy Scale: Good     Standing balance support: Bilateral upper extremity supported Standing balance-Leahy Scale: Poor Standing balance comment: reliant on external support of RW                            Cognition Arousal/Alertness: Awake/alert Behavior During Therapy: WFL for tasks assessed/performed Overall Cognitive Status: Within Functional Limits for tasks assessed                                 General Comments: Requiring encouragment      Exercises      General Comments  Pertinent Vitals/Pain Pain Score: 6  Pain Location: back and left knee Pain Descriptors / Indicators: Discomfort;Grimacing;Guarding Pain Intervention(s): Limited activity within patient's tolerance;Monitored during session;Repositioned;Premedicated before session    Home Living                      Prior Function            PT Goals (current goals can now be found in the care plan section) Progress towards PT goals: Progressing toward  goals    Frequency    Min 2X/week      PT Plan Frequency needs to be updated    Co-evaluation              AM-PAC PT "6 Clicks" Daily Activity  Outcome Measure  Difficulty turning over in bed (including adjusting bedclothes, sheets and blankets)?: A Little Difficulty moving from lying on back to sitting on the side of the bed? : A Little Difficulty sitting down on and standing up from a chair with arms (e.g., wheelchair, bedside commode, etc,.)?: Unable Help needed moving to and from a bed to chair (including a wheelchair)?: A Little Help needed walking in hospital room?: A Lot Help needed climbing 3-5 steps with a railing? : Total 6 Click Score: 13    End of Session Equipment Utilized During Treatment: Gait belt;Left knee immobilizer Activity Tolerance: Patient limited by pain;Patient limited by fatigue Patient left: in chair;with call bell/phone within reach Nurse Communication: Mobility status PT Visit Diagnosis: Unsteadiness on feet (R26.81);Difficulty in walking, not elsewhere classified (R26.2);Muscle weakness (generalized) (M62.81);Other abnormalities of gait and mobility (R26.89);Pain Pain - Right/Left: Left Pain - part of body: Knee     Time: 1143-1208 PT Time Calculation (min) (ACUTE ONLY): 25 min  Charges:  $Gait Training: 8-22 mins $Therapeutic Activity: 8-22 mins                    G Codes:       Gabe Zanae Kuehnle, SPT   [employer] 10/20/2017, 12:31 PM

## 2017-10-20 NOTE — Progress Notes (Signed)
Kern Reap 11:14 AM  Subjective: Patient seen and examined and hospital computer chart reviewed and case discussed with my partner Dr. Randa Evens and he is feeling better and eating a little better and his diarrhea is improved and has no new complaints  Objective: Vital signs stable afebrile abdomen is soft to little distended some tympany occasional bowel sounds nontender creatinine a little worse white count better  Assessment: Multiple medical problems including [name]  Plan: Continue present management await CVT S opinion regarding timing of surgery please call if any specific question or problem otherwise I will check in on later this week  [facility]  Pager [phone] After 5PM or if no answer call [phone]

## 2017-10-20 NOTE — Progress Notes (Addendum)
[address]       [place] [address]             [phone]     CARDIOTHORACIC SURGERY PROGRESS NOTE  Subjective: Feels some better.  Appetite somewhat improved. Stools reportedly more formed but still w/ some sense of urgency.  Still feels more weak than he did at the time of admission.  Objective: Vital signs in last 24 hours: Temp:  [99 F (37.2 C)-99.6 F (37.6 C)] 99.6 F (37.6 C) (05/20 1100) Pulse Rate:  [91-97] 97 (05/20 1101) Cardiac Rhythm: Normal sinus rhythm (05/20 0900) Resp:  [16-18] 16 (05/20 1100) BP: (92-95)/(70-73) 94/70 (05/20 1101) SpO2:  [90 %-98 %] 90 % (05/20 1101) Weight:  [162 lb 6.4 oz (73.7 kg)] 162 lb 6.4 oz (73.7 kg) (05/20 0457)  Physical Exam:  Rhythm:   sinus  Breath sounds: Diminished right base  Heart sounds:  RRR w/ holosystolic murmur  Incisions:  n/a  Abdomen:  Distended and tympanitic, non tender  Extremities:  Warm, well-perfused    Intake/Output from previous day: 05/19 0701 - 05/20 0700 In: 1683 [P.O.:1680; I.V.:3] Out: 2250 [Urine:2250] Intake/Output this shift: Total I/O In: 600 [P.O.:600] Out: 200 [Urine:200]  Lab Results: Recent Labs    10/19/17 0717 10/20/17 0659  WBC 13.8* 12.3*  HGB 9.0* 9.5*  HCT 28.9* 30.1*  PLT 371 378   BMET:  Recent Labs    10/19/17 0717 10/20/17 0659  NA 136 135  K 3.4* 3.4*  CL 101 100*  CO2 25 24  GLUCOSE 99 129*  BUN 28* 28*  CREATININE 1.36* 1.65*  CALCIUM 8.3* 8.5*    CBG (last 3)  No results for input(s): GLUCAP in the last 72 hours. PT/INR:   Recent Labs    10/20/17 0659  LABPROT 17.3*  INR 1.43    CXR:  CHEST - 2 VIEW  COMPARISON:  10/07/2017  FINDINGS: Cardiac shadow remains enlarged. Bilateral pleural effusions are again identified right greater than left stable from the prior exam. No new focal infiltrate is seen. No bony abnormality is noted.  IMPRESSION: Stable bilateral pleural effusions right greater than  left.   Electronically Signed   By: Alcide Clever M.D.   On: 10/20/2017 08:09    ABDOMEN - 1 VIEW  COMPARISON:  CT 10/08/2017  FINDINGS: Small pleural effusions at the bases with airspace disease at the right base. Mild gaseous enlargement of colon with moderate to large amount of feces in the colon. No abnormal calcification. Surgical plate and fixating screws left acetabulum.  IMPRESSION: Mild gaseous enlargement of the colon. Moderate to large amount of stool in the colon.   Electronically Signed   By: Jasmine Pang M.D.   On: 10/19/2017 19:11    Assessment/Plan:  Patient's situation discussed at length with Dr. Shirlee Latch and Dr. Ewing Schlein.  Although Mr Howerter colitis has improved it does not appear to have completely resolved.  His abdomen remains moderately distended and tympanitic.  KUB confirms the presence of mild gaseous enlargement of the colon.  There is some stool in the colon and reportedly his stools are becoming more formed.  I remain concerned about the possibility of proceeding with elective mitral valve repair or replacement at this time due to concerns that surgical intervention (in particular use of cardiopulmonary bypass) could set the patient back again and cause another significant flareup of colitis or even potentially toxic megacolon.  Under the circumstances it seems most prudent  to postpone surgery further.  I have discussed matters at length with the patient and his mother at the bedside.  At this time they are inclined to see if it might be possible for him to go home for a period of a few weeks to allow his colitis to resolve more completely so that he can get back to how he was doing before.  Management of his heart failure remains tenuous.  He does have a moderate to large size right pleural effusion which could be treated with thoracentesis, although he is not complaining of significant shortness of breath at this time.  We will continue to follow  along closely.  If he improves dramatically over the next few days we could potentially reschedule surgery for next week.  Otherwise I would favor sending him home with hopes that he might be ready for surgery sometime in June.   I spent in excess of 30 minutes during the conduct of this hospital encounter and >50% of this time involved direct face-to-face encounter with the patient for counseling and/or coordination of their care.   Purcell Nails, MD 10/20/2017 4:29 PM

## 2017-10-20 NOTE — Progress Notes (Addendum)
Patient ID: Marcus [name], male   DOB: 1965-09-22, 52 y.o.   MRN: [id number]     Advanced Heart Failure Rounding Note  PCP-Cardiologist: Armanda Magic, MD  HF Cardiology: Shirlee Latch  Subjective:    PT and Dr Allena Katz from rehab recommending SNF.   Negative 560 cc and down 2 lbs. Creatinine 1.65. WBC 12.3.   Feeling OK today. Feeling frustrated about LOS. Wants to get surgery "over with" Denies SOB or CP. No Orthopnea or PND. C. Diff clearing up. Having 2 semi-soft BMs a day.  GI recommended sigmoidoscopy but pt refused. KUB with mild gaseous enlargement of the colon with mod/large amount of stool. They recommend oral vancomycin through surgery (even via feeding tube) as long as he is requiring post-op ABX.   + C-Diff test 10/08/17. Started on Vancomycin.  Completing 6 weeks of vancomycin.  Started Flagyl 5/14.  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 + Diffuse colonic wall thickening consistent with colitis.   RHC 10/01/17 RHC Procedural Findings: Hemodynamics (mmHg) RA mean 11 RV 47/15 PA 49/25, mean 38 PCWP mean 19, v-waves to 35 Oxygen saturations: PA 52% AO 94% Cardiac Output (Fick) 4.35  Cardiac Index (Fick) 2.35  PVR 4.4 WU  Objective:   Weight Range: 162 lb 6.4 oz (73.7 kg) Body mass index is 22.65 kg/m.   Vital Signs:   Temp:  [98.5 F (36.9 C)-99.1 F (37.3 C)] 99 F (37.2 C) (05/20 0457) Pulse Rate:  [89-94] 93 (05/20 0457) Resp:  [16-18] 16 (05/20 0457) BP: (95-98)/(71-77) 95/73 (05/20 0457) SpO2:  [91 %-98 %] 98 % (05/20 0457) Weight:  [162 lb 6.4 oz (73.7 kg)] 162 lb 6.4 oz (73.7 kg) (05/20 0457) Last BM Date: 10/19/17  Weight change: Filed Weights   10/18/17 0507 10/19/17 0602 10/20/17 0457  Weight: 162 lb 1.6 oz (73.5 kg) 164 lb (74.4 kg) 162 lb 6.4 oz (73.7 kg)    Intake/Output:   Intake/Output Summary (Last 24 hours) at 10/20/2017 1000 Last data filed at 10/20/2017  0600 Gross per 24 hour  Intake 1683 ml  Output 2250 ml  Net -567 ml      Physical Exam   General: NAD  HEENT: Normal Neck: Supple. JVP 8-9 cm. Carotids 2+ bilat; no bruits. No thyromegaly or nodule noted. Cor: PMI nondisplaced. RRR, 3/6 HSM apex.  Lungs: CTAB, normal effort. Abdomen: Soft, non-tender, non-distended, no HSM. No bruits or masses. +BS  Extremities: No cyanosis, clubbing, or rash. BLE trace to 1+ edema. BLE ted hose.  Neuro: Alert & orientedx3, cranial nerves grossly intact. moves all 4 extremities w/o difficulty. Affect pleasant   Telemetry   NSR 70-80s, personally reviewed.   Labs    CBC Recent Labs    10/19/17 0717 10/20/17 0659  WBC 13.8* 12.3*  HGB 9.0* 9.5*  HCT 28.9* 30.1*  MCV 77.3* 78.6  PLT 371 378   Basic Metabolic Panel Recent Labs    78/29/56 0717 10/20/17 0659  NA 136 135  K 3.4* 3.4*  CL 101 100*  CO2 25 24  GLUCOSE 99 129*  BUN 28* 28*  CREATININE 1.36* 1.65*  CALCIUM 8.3* 8.5*   Liver Function Tests No results for input(s): AST, ALT, ALKPHOS, BILITOT, PROT, ALBUMIN in the last 72 hours. No results for input(s): LIPASE, AMYLASE in the last 72 hours. Cardiac Enzymes No results for input(s): CKTOTAL, CKMB, CKMBINDEX, TROPONINI in the last 72 hours.  BNP: BNP (last 3 results)  Recent Labs    08/09/17 1607 09/30/17 1500  BNP 449.0* 706.9*    ProBNP (last 3 results) Recent Labs    09/29/17 1350  PROBNP 13,809*     D-Dimer No results for input(s): DDIMER in the last 72 hours. Hemoglobin A1C No results for input(s): HGBA1C in the last 72 hours. Fasting Lipid Panel No results for input(s): CHOL, HDL, LDLCALC, TRIG, CHOLHDL, LDLDIRECT in the last 72 hours. Thyroid Function Tests No results for input(s): TSH, T4TOTAL, T3FREE, THYROIDAB in the last 72 hours.  Invalid input(s): FREET3  Other results:   Imaging    Dg Chest 2 View  Result Date: 10/20/2017 CLINICAL DATA:  Shortness of breath EXAM: CHEST - 2  VIEW COMPARISON:  10/07/2017 FINDINGS: Cardiac shadow remains enlarged. Bilateral pleural effusions are again identified right greater than left stable from the prior exam. No new focal infiltrate is seen. No bony abnormality is noted. IMPRESSION: Stable bilateral pleural effusions right greater than left. Electronically Signed   By: Alcide Clever M.D.   On: 10/20/2017 08:09   Dg Abd 1 View  Result Date: 10/19/2017 CLINICAL DATA:  Abdominal distension EXAM: ABDOMEN - 1 VIEW COMPARISON:  CT 10/08/2017 FINDINGS: Small pleural effusions at the bases with airspace disease at the right base. Mild gaseous enlargement of colon with moderate to large amount of feces in the colon. No abnormal calcification. Surgical plate and fixating screws left acetabulum. IMPRESSION: Mild gaseous enlargement of the colon. Moderate to large amount of stool in the colon. Electronically Signed   By: Jasmine Pang M.D.   On: 10/19/2017 19:11     Medications:     Scheduled Medications: . amiodarone  200 mg Oral Daily  . chlorhexidine  60 mL Topical Once  . furosemide  40 mg Oral BID  . gabapentin  300 mg Oral TID  . metroNIDAZOLE  500 mg Oral Q8H  . potassium chloride  40 mEq Oral Daily  . sodium chloride flush  3 mL Intravenous Q12H  . sodium chloride flush  3 mL Intravenous Q12H  . thiamine  100 mg Oral Daily  . vancomycin  125 mg Oral QID   Followed by  . [START ON 10/22/2017] vancomycin  125 mg Oral BID   Followed by  . [START ON 10/30/2017] vancomycin  125 mg Oral Daily   Followed by  . [START ON 11/06/2017] vancomycin  125 mg Oral QODAY   Followed by  . [START ON 11/14/2017] vancomycin  125 mg Oral Q3 days    Infusions: . sodium chloride    . sodium chloride      PRN Medications: sodium chloride, sodium chloride, acetaminophen, iopamidol, ondansetron (ZOFRAN) IV, oxyCODONE, sodium chloride flush, sodium chloride flush   Patient Profile   DONTREZ PETTIS is a 52 y.o. male with a history of endocarditis  of mitral valve with severe MR, embolic stroke, septic arthritis of left knee, hepatitis C, polysubstance abuse, diastolic heart failure, nonobstructive CAD, and pleural effusion requiring thoracentesis  Admitted for A/C diastolic HF with elevated BNP and AKI from Dr Norris Cross office.   Assessment/Plan   1. Acute diastolic CHF: Due to severe mitral regurgitation from perforated anterior leaflet.  He was admitted markedly volume overloaded on exam.  Symptoms were baseline, NYHA class III, likely due to a combination of deconditioning and CHF.  Echo showed that LV EF remains normal at 55-60%.  However, RV has mild to moderate systolic dysfunction. RHC 10/01/17 mildly elevated PCWP and pulmonary venous hypertension. Relatively  preserved CO.  - Volume status stable on 40 mg po lasix BID.  2. Severe mitral regurgitation: Due to prior endocarditis with MSSA. 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  - BP too soft to add afterload reduction. SBP ~90 - MV repair on hold with C-Diff colitis. -> Tentative plan for MV repair/replacement 5/21. Surgery to advise.  3. Aflutter with RVR:    - Maintaining NSR on po amio 200 mg daily.  4. 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  He has had a full course of antibiotic treatment and PICC is now out.  Now with recurrent C difficile colitis.  - WBC 12.3 this am.  - On flagyl.  - On po vanc for 6 weeks.    5. H/o septic arthritis: Left knee  - This significantly limits his mobility. Working with PT/OT. - No change to current plan.   6. Right pleural effusion:  - Moderate on CXR this admission.  Chronic, likely due to CHF.  - No change to current plan.   7. Polysubstance abuse: - Prior use of opioids. Encouraged complete cessation.  - No change to current plan.   8. AKI on CKD: Stage 3.  - Creatinine 1.65 this am.  9. Diarrhea: He had C difficile back in March.   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  - Less frequent BMs, but still loose.  - Now on po vanc with taper per ID.  Also on flagyl.  - Will need to continue po vanc per GI.  10. Disposition - Will need SNF at discharge.  - Tentative plan for MV repair/replacement tomorrow, Tuesday, 5/21  Length of Stay: [address]  Graciella Freer, PA-C  10/20/2017, 10:00 AM  Advanced Heart Failure Team Pager [phone] (M-F; 7a - 4p)  Please contact [facility] for night-coverage after hours (4p -7a ) and weekends on [email or web address]  Patient seen with PA, agree with the above note.  Patient stable today.  Slow improvement from C difficile colitis.  Continues on Flagyl + vancomycin, will continue Flagyl x 2 wks total and vancomycin taper over a number of weeks.    I do think that he has some volume overload on exam today.  Will hold po Lasix and give him a dose of 80 mg IV Lasix this afternoon.    Tentative plan for surgery tomorrow, think this is reasonable.   Marca Ancona 10/20/2017 11:38 AM

## 2017-10-21 ENCOUNTER — Encounter (HOSPITAL_COMMUNITY)
Admission: EM | Disposition: A | Discharge status: Home Health | Payer: Self-pay | Source: Home / Self Care | Attending: Cardiology

## 2017-10-21 LAB — CBC WITH DIFFERENTIAL/PLATELET
Abs Immature Granulocytes: 0.6 10*3/uL — ABNORMAL HIGH (ref 0.0–0.1)
BASOS PCT: 1 %
Basophils Absolute: 0.1 10*3/uL (ref 0.0–0.1)
EOS ABS: 0.5 10*3/uL (ref 0.0–0.7)
EOS PCT: 4 %
HEMATOCRIT: 31.2 % — AB (ref 39.0–52.0)
Hemoglobin: 9.7 g/dL — ABNORMAL LOW (ref 13.0–17.0)
IMMATURE GRANULOCYTES: 5 %
LYMPHS ABS: 2.3 10*3/uL (ref 0.7–4.0)
Lymphocytes Relative: 20 %
MCH: 24.4 pg — AB (ref 26.0–34.0)
MCHC: 31.1 g/dL (ref 30.0–36.0)
MCV: 78.6 fL (ref 78.0–100.0)
MONO ABS: 1.1 10*3/uL — AB (ref 0.1–1.0)
Monocytes Relative: 10 %
Neutro Abs: 6.7 10*3/uL (ref 1.7–7.7)
Neutrophils Relative %: 60 %
PLATELETS: 379 10*3/uL (ref 150–400)
RBC: 3.97 MIL/uL — ABNORMAL LOW (ref 4.22–5.81)
RDW: 19.9 % — AB (ref 11.5–15.5)
WBC: 11.2 10*3/uL — ABNORMAL HIGH (ref 4.0–10.5)

## 2017-10-21 LAB — BASIC METABOLIC PANEL
Anion gap: 8 (ref 5–15)
BUN: 26 mg/dL — AB (ref 6–20)
CALCIUM: 8.8 mg/dL — AB (ref 8.9–10.3)
CHLORIDE: 99 mmol/L — AB (ref 101–111)
CO2: 29 mmol/L (ref 22–32)
Creatinine, Ser: 1.57 mg/dL — ABNORMAL HIGH (ref 0.61–1.24)
GFR calc non Af Amer: 49 mL/min — ABNORMAL LOW (ref >60)
GFR, EST AFRICAN AMERICAN: 57 mL/min — AB (ref >60)
GLUCOSE: 99 mg/dL (ref 65–99)
Potassium: 3.8 mmol/L (ref 3.5–5.1)
Sodium: 136 mmol/L (ref 135–145)

## 2017-10-21 SURGERY — REPAIR, MITRAL VALVE, MINIMALLY INVASIVE
Anesthesia: General | Site: Chest | Laterality: Right

## 2017-10-21 MED ORDER — FUROSEMIDE 40 MG PO TABS
40.0000 mg | ORAL_TABLET | Freq: Two times a day (BID) | ORAL | Status: DC
  Administered 2017-10-21 (×2): 40 mg via ORAL
  Filled 2017-10-21 (×2): qty 1

## 2017-10-21 NOTE — Progress Notes (Signed)
Occupational Therapy Treatment Patient Details Name: Marcus [name] MRN: [id number] DOB: [birth year] Today's Date: 10/21/2017    History of present illness Marcus [name] is a 52 y.o. 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 Admitted forA/C diastolic HFwith elevated BNP and AKI. R heart cath 10/01/17   OT comments  Pt progressing towards established OT goals. Pt performing functional mobility to/from sink and demonstrated increased standing tolerance to perform three grooming tasks while at sink. Pt presenting with improving standing posture requiring Min VCs to correct forward flexion during ADLs and mobility. Pt continue to present with fatigue and required significant time for ADLs and functional mobility.  Continue to recommend post-acute rehab and will continue to follow acutely as needed. Of note, pt with possible dc to home today due to surgery canceled and rescheduling for next week.    Follow Up Recommendations  CIR;Supervision/Assistance - 24 hour    Equipment Recommendations  Other (comment)(TBD next venue)    Recommendations for Other Services Rehab consult    Precautions / Restrictions Precautions Precautions: Fall Precaution Comments: KI per pt request Required Braces or Orthoses: Knee Immobilizer - Left Knee Immobilizer - Left: On when out of bed or walking Restrictions Weight Bearing Restrictions: No       Mobility Bed Mobility Overal bed mobility: Needs Assistance Bed Mobility: Supine to Sit     Supine to sit: Supervision     General bed mobility comments: increased time  Transfers Overall transfer level: Needs assistance Equipment used: Rolling walker (2 wheeled) Transfers: Sit to/from Stand Sit to Stand: Min assist Stand pivot transfers: Mod assist;+2 physical  assistance;+2 safety/equipment      Lateral/Scoot Transfers: Min assist General transfer comment: assist to stabilize RW. Pt unable to stand on first attempt, 2nd attempt used foot board and stabilized RW with assist to stand and stabilize, 3rd trial from recliner with min assist only to stabilize RW as pt pulled on it to stand    Balance Overall balance assessment: Needs assistance Sitting-balance support: No upper extremity supported;Feet supported Sitting balance-Leahy Scale: Good     Standing balance support: During functional activity;Single extremity supported Standing balance-Leahy Scale: Poor Standing balance comment: Reliant on atleast single UE support for balance as seen at the sink                           ADL either performed or assessed with clinical judgement   ADL Overall ADL's : Needs assistance/impaired     Grooming: Wash/dry face;Oral care;Brushing hair;Minimal assistance;Min guard;Standing Grooming Details (indicate cue type and reason): Pt performing three grooming tasks at sink with [employer] for safety. During bilateral tasks where pt unable to use single UE for support, pt requiring MIn A for balance and noting posterior lean.  Upper Body Bathing: Sitting;Set up Upper Body Bathing Details (indicate cue type and reason): Pt applying body lotion to BUEs while seated. Lower Body Bathing: Set up;Supervison/ safety Lower Body Bathing Details (indicate cue type and reason): Pt applying lotion to BLEs while sitting at EOB. Pt demonstratign increased sitting balance and ROM to reach forward.         Toilet Transfer: Min guard;Ambulation;RW(Simulated to recliner) Statistician Details (indicate cue type and reason): Min Guard A for safety and VCs for safe sequencing.  Functional mobility during ADLs: Min guard;Rolling walker General ADL Comments: Pt demonstrating increased endurance and strength compared to prior session. Pt performing  functional mobility to/from sink and standing to perform three groom task. Pt continue to requiring significant amount of time due to fatigue. Pt applying body lotion while sitting demonstrating increased sitting balance and endurance. Focused session on activity tolerance and standing posture. Pt requiring Min VCs to correct standing posture at sink and during mobility.      Vision       Perception     Praxis      Cognition Arousal/Alertness: Awake/alert Behavior During Therapy: WFL for tasks assessed/performed Overall Cognitive Status: Within Functional Limits for tasks assessed                                 General Comments: Requiring encouragment        Exercises     Shoulder Instructions       General Comments      Pertinent Vitals/ Pain       Pain Assessment: Faces Faces Pain Scale: Hurts even more Pain Location: back and left knee Pain Descriptors / Indicators: Discomfort;Grimacing;Guarding Pain Intervention(s): Monitored during session;Repositioned  Home Living                                          Prior Functioning/Environment              Frequency  Min 3X/week        Progress Toward Goals  OT Goals(current goals can now be found in the care plan section)  Progress towards OT goals: Progressing toward goals  Acute Rehab OT Goals Patient Stated Goal: to go home OT Goal Formulation: With patient Time For Goal Achievement: 10/16/17 Potential to Achieve Goals: Good ADL Goals Pt Will Perform Upper Body Bathing: with modified independence;with caregiver independent in assisting;with adaptive equipment;sitting Pt Will Perform Lower Body Bathing: with modified independence;with caregiver independent in assisting;with adaptive equipment;sit to/from stand Pt Will Transfer to Toilet: with modified independence;ambulating Pt Will Perform Toileting - Clothing Manipulation and hygiene: with modified independence;sit  to/from stand;with adaptive equipment Additional ADL Goal #1: Pt will perform bed mobility at mod I level prior to participating in ADL activity  Plan Discharge plan remains appropriate    Co-evaluation                 AM-PAC PT "6 Clicks" Daily Activity     Outcome Measure   Help from another person eating meals?: None Help from another person taking care of personal grooming?: A Little Help from another person toileting, which includes using toliet, bedpan, or urinal?: A Lot Help from another person bathing (including washing, rinsing, drying)?: A Lot Help from another person to put on and taking off regular upper body clothing?: A Little Help from another person to put on and taking off regular lower body clothing?: A Lot 6 Click Score: 16    End of Session Equipment Utilized During Treatment: Rolling walker;Left knee immobilizer  OT Visit Diagnosis: Unsteadiness on feet (R26.81);Other abnormalities of gait and mobility (R26.89);Muscle weakness (generalized) (M62.81);Pain Pain - Right/Left: Left Pain - part of body: (back)   Activity Tolerance Patient tolerated treatment well;Patient limited by fatigue   Patient Left with call bell/phone within reach;in chair   Nurse Communication  Mobility status        Time: 1610-9604 OT Time Calculation (min): 28 min  Charges: OT General Charges $OT Visit: 1 Visit OT Treatments $Self Care/Home Management : 23-37 mins  Narda Fundora MSOT, OTR/L Acute Rehab Pager: [phone] Office: [phone]   Theodoro Grist Mihcael Ledee 10/21/2017, 9:18 AM

## 2017-10-21 NOTE — Clinical Social Work Note (Signed)
CSW note from 5/17:  "Discussed LOG with Chiropodist of social work. Patient discharge to [facility] in [place] SNF a few months ago and left AMA. Patient is not on any IV antibiotics and according to the PT evaluation, patient has 24/7 supervision at home from his parents. Chiropodist of social work has denied 30-day LOG unless there is a change in function after procedure on Tuesday. RNCM aware."  Procedure cancelled for today and per [facility], patient is determined to return home.  Charlynn Court, CSW [phone]

## 2017-10-21 NOTE — Progress Notes (Signed)
CM talked to patient at length about DCP; noted that he was in a SNF ( no insurance, on a Letter of Guarantee from [facility]) and he left AMA; patient is refusing to return to SNF and wants to go home with his parents; he is active with [facility] for [facility] services as prior to admission; has a hospital bed, 3:1 and wheelchair at home. Patient stated " I'm frustrated, tired of talking, if they are not going to do surgery on me, I want to go home." CM will continue to follow for progression of care. Abelino Derrick [facility] [phone]

## 2017-10-21 NOTE — Progress Notes (Signed)
Surgery scheduled for tomorrow is cancelled per Cornelius Moras MD.  Elsie Lincoln, RN

## 2017-10-21 NOTE — Progress Notes (Addendum)
Patient ID: Marcus [name], male   DOB: Jan 09, 1966, 52 y.o.   MRN: [id number]     Advanced Heart Failure Rounding Note  PCP-Cardiologist: Armanda Magic, MD  HF Cardiology: Shirlee Latch  Subjective:    PT and Dr Allena Katz from rehab recommending SNF.   Negative 220 cc and down another 2 lbs. Creatinine 1.57. WBC 11.2   Surgery post-poned due to C-Diff.   Tired and frustrated. Denies SOB. PT continues to recommend SNF with poor mobility. Only walking 14 feet before need a break.   GI recommended sigmoidoscopy but pt refused. KUB with mild gaseous enlargement of the colon with mod/large amount of stool. They recommend oral vancomycin through surgery (even via feeding tube) as long as he is requiring post-op ABX.   + C-Diff test 10/08/17. Started on Vancomycin.  Completing 6 weeks of vancomycin.  Started Flagyl 5/14.  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 + Diffuse colonic wall thickening consistent with colitis.   RHC 10/01/17 RHC Procedural Findings: Hemodynamics (mmHg) RA mean 11 RV 47/15 PA 49/25, mean 38 PCWP mean 19, v-waves to 35 Oxygen saturations: PA 52% AO 94% Cardiac Output (Fick) 4.35  Cardiac Index (Fick) 2.35  PVR 4.4 WU  Objective:   Weight Range: 160 lb 14.4 oz (73 kg) Body mass index is 22.44 kg/m.   Vital Signs:   Temp:  [97.8 F (36.6 C)-99.6 F (37.6 C)] 97.8 F (36.6 C) (05/21 0758) Pulse Rate:  [83-97] 91 (05/21 0758) Resp:  [16-20] 20 (05/21 0758) BP: (92-123)/(59-81) 102/59 (05/21 0758) SpO2:  [90 %-97 %] 92 % (05/21 0758) Weight:  [160 lb 14.4 oz (73 kg)] 160 lb 14.4 oz (73 kg) (05/21 0642) Last BM Date: 10/20/17  Weight change: Filed Weights   10/19/17 0602 10/20/17 0457 10/21/17 0642  Weight: 164 lb (74.4 kg) 162 lb 6.4 oz (73.7 kg) 160 lb 14.4 oz (73 kg)    Intake/Output:   Intake/Output Summary (Last 24 hours) at 10/21/2017 0923 Last data filed at 10/21/2017  0548 Gross per 24 hour  Intake 963 ml  Output 1550 ml  Net -587 ml      Physical Exam   General: NAD  HEENT: Normal Neck: Supple. JVP 7-8 cm. Carotids 2+ bilat; no bruits. No thyromegaly or nodule noted. Cor: PMI nondisplaced. RRR, 3/6 HSM Apex.  Lungs: CTAB, normal effort. Abdomen: Soft, non-tender, non-distended, no HSM. No bruits or masses. +BS  Extremities: No cyanosis, clubbing, or rash. BLE trace to 1+ edema.  Neuro: Alert & orientedx3, cranial nerves grossly intact. moves all 4 extremities w/o difficulty. Affect pleasant   Telemetry   NSR 70-80s, personally reviewed.   Labs    CBC Recent Labs    10/20/17 0659 10/21/17 0516  WBC 12.3* 11.2*  NEUTROABS  --  6.7  HGB 9.5* 9.7*  HCT 30.1* 31.2*  MCV 78.6 78.6  PLT 378 379   Basic Metabolic Panel Recent Labs    40/98/11 0659 10/21/17 0516  NA 135 136  K 3.4* 3.8  CL 100* 99*  CO2 24 29  GLUCOSE 129* 99  BUN 28* 26*  CREATININE 1.65* 1.57*  CALCIUM 8.5* 8.8*   Liver Function Tests No results for input(s): AST, ALT, ALKPHOS, BILITOT, PROT, ALBUMIN in the last 72 hours. No results for input(s): LIPASE, AMYLASE in the last 72 hours. Cardiac Enzymes No results for input(s): CKTOTAL, CKMB, CKMBINDEX, TROPONINI in the last 72 hours.  BNP: BNP (last  3 results) Recent Labs    08/09/17 1607 09/30/17 1500  BNP 449.0* 706.9*    ProBNP (last 3 results) Recent Labs    09/29/17 1350  PROBNP 13,809*     D-Dimer No results for input(s): DDIMER in the last 72 hours. Hemoglobin A1C No results for input(s): HGBA1C in the last 72 hours. Fasting Lipid Panel No results for input(s): CHOL, HDL, LDLCALC, TRIG, CHOLHDL, LDLDIRECT in the last 72 hours. Thyroid Function Tests No results for input(s): TSH, T4TOTAL, T3FREE, THYROIDAB in the last 72 hours.  Invalid input(s): FREET3  Other results:   Imaging    No results found.   Medications:     Scheduled Medications: . amiodarone  200 mg Oral  Daily  . chlorhexidine  60 mL Topical Once  . gabapentin  300 mg Oral TID  . heparin-papaverine-plasmalyte irrigation   Irrigation To OR  . [place] Blood Cardioplegia (KBC) lidocaine 2% Syringe (13mL)  13 mL Intracoronary Once  . [place] Blood Cardioplegia (KBC) lidocaine 2% Syringe (13mL)  13 mL Intracoronary Once  . [place] Blood Cardioplegia (KBC) mannitol 20% Syringe (32mL)  32 mL Intracoronary Once  . [place] Blood Cardioplegia (KBC) mannitol 20% Syringe (32mL)  32 mL Intracoronary Once  . magnesium sulfate  40 mEq Other To OR  . metroNIDAZOLE  500 mg Oral Q8H  . potassium chloride  80 mEq Other To OR  . potassium chloride  40 mEq Oral Daily  . sodium chloride flush  3 mL Intravenous Q12H  . sodium chloride flush  3 mL Intravenous Q12H  . thiamine  100 mg Oral Daily  . tranexamic acid  15 mg/kg Intravenous To OR  . tranexamic acid  2 mg/kg Intracatheter To OR  . vancomycin 1000 mg in NS (1000 ml) irrigation for Dr. Cornelius Moras case   Irrigation To OR  . vancomycin  125 mg Oral QID   Followed by  . [START ON 10/22/2017] vancomycin  125 mg Oral BID   Followed by  . [START ON 10/30/2017] vancomycin  125 mg Oral Daily   Followed by  . [START ON 11/06/2017] vancomycin  125 mg Oral QODAY   Followed by  . [START ON 11/14/2017] vancomycin  125 mg Oral Q3 days    Infusions: . sodium chloride    . sodium chloride    . cefUROXime (ZINACEF)  IV    . cefUROXime (ZINACEF)  IV    . dexmedetomidine    . DOPamine    . epinephrine    . heparin 30,000 units/NS 1000 mL solution for CELLSAVER    . insulin (NOVOLIN-R) infusion    . milrinone    . nitroGLYCERIN    . phenylephrine /25mL NS (0.08mg /ml) infusion    . tranexamic acid (CYKLOKAPRON) infusion ([facility])    . vancomycin      PRN Medications: sodium chloride, sodium chloride, acetaminophen, iopamidol, ondansetron (ZOFRAN) IV, oxyCODONE, sodium chloride flush, sodium chloride flush   Patient Profile   Marcus [name] is a 52  y.o. 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   Assessment/Plan   1. Acute diastolic CHF: Due to severe mitral regurgitation from perforated anterior leaflet.  He was admitted markedly volume overloaded on exam.  Symptoms were baseline, NYHA class III, likely  due to a combination of deconditioning and CHF.  Echo showed that LV EF remains normal at 55-60%.  However, RV has mild to moderate systolic dysfunction. RHC 10/01/17 mildly elevated PCWP and pulmonary venous hypertension. Relatively preserved CO.  - Volume status improved after IV lasix yesterday - Continue lasix 40 mg po BID.  2. Severe mitral regurgitation: Due to prior endocarditis with MSSA. 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  - Surgery postponed due to improving colitis. Looking at as early as next week vs into June.  - BP too soft to add afterload reduction. SBP ~90 - MV repair on hold with C-Diff colitis. -> Tentative plan for MV repair/replacement 5/21. Surgery to advise.  3. Aflutter with RVR:    - Maintaining NSR on po amio 200 mg daily.  - No change to current plan.   4. 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  He has had a full course of antibiotic treatment and PICC is now out.  Now with recurrent C difficile colitis.  - WBC 11.2  - On flagyl.  - On po vanc for 6 weeks.    5. H/o septic arthritis: Left knee  - This significantly limits his mobility. Working with PT/OT. - No change to current plan.   6. Right pleural effusion:  - Moderate on CXR this admission.  Chronic, likely due to CHF.  - Could consider Thoracentesis.  - No change to  current plan.   7. Polysubstance abuse: - Prior use of opioids. Encouraged complete cessation.  - No change to current plan.   8. AKI on CKD: Stage 3.  - Creatinine down to 1.57.  9. Diarrhea: He had C difficile back in March.  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  - Less frequent BMs, but still loose.  - Now on po vanc with taper per ID.  Also on flagyl.  - Will need to continue po vanc per GI.  10. Disposition - He needs SNF. Case manager to see today to discuss options without insurance.  - Surgery postponed.   Difficult position. Pt wishes to go home prior to surgery, but is very deconditioned. Will discuss plan with  MD.  Length of Stay: [address] [place], [place]  10/21/2017, 9:23 AM  Advanced Heart Failure Team Pager [phone] (M-F; 7a - 4p)  Please contact [facility] for night-coverage after hours (4p -7a ) and weekends on [email or web address]  Patient seen with PA, agree with the above note.  Patient's abdomen remains distended but he has had 1 normal BM in the last 24 hrs and is eating normally.    On exam, he is mildly volume overloaded with JVP 8-9 cm.  He got IV Lasix yesterday, weight down.  Back on po Lasix today.  - I will increase to 60 mg po bid Lasix.   He will have a long po vancomycin taper and will complete 2 weeks of metronidazole for C difficile colitis.   He insists on going home, will not go to rehab/SNF.  Will discharge him tomorrow, will need to arrange home health and home PT.  He will stay with his parents.  He will need to see Dr. Cornelius Moras in the office next week.  If abdomen is benign, surgery will need to be scheduled for the near future.  Will make CHF clinic followup.   Marcus [name] 10/21/2017 4:26 PM

## 2017-10-21 NOTE — Progress Notes (Signed)
Marcus [name] 12:33 PM  Subjective: Patient doing fine from a GI standpoint and only had one bowel movement today and ate breakfast fine without any nausea vomiting or pain and his case extensively discussed with CVTS  Objective: Vital signs stable afebrile no acute distress abdomen is soft nontender still little tamponade normal bowel sounds white count decreased a little  Assessment: [name] improved  Plan: I discussed awaiting one week to proceed with surgery and allowing his [name] to improve and he agrees with this plan and okay with me to go home as long as he can get his medicine there and please call us if we can be of any further assistance with this hospital stay [facility]  Pager [phone] After 5PM or if no answer call [phone]

## 2017-10-22 ENCOUNTER — Other Ambulatory Visit: Payer: Self-pay | Admitting: *Deleted

## 2017-10-22 DIAGNOSIS — K529 Noninfective gastroenteritis and colitis, unspecified: Secondary | ICD-10-CM

## 2017-10-22 DIAGNOSIS — I340 Nonrheumatic mitral (valve) insufficiency: Secondary | ICD-10-CM

## 2017-10-22 LAB — BASIC METABOLIC PANEL
ANION GAP: 10 (ref 5–15)
BUN: 27 mg/dL — ABNORMAL HIGH (ref 6–20)
CALCIUM: 8.7 mg/dL — AB (ref 8.9–10.3)
CO2: 27 mmol/L (ref 22–32)
Chloride: 99 mmol/L — ABNORMAL LOW (ref 101–111)
Creatinine, Ser: 1.59 mg/dL — ABNORMAL HIGH (ref 0.61–1.24)
GFR, EST AFRICAN AMERICAN: 56 mL/min — AB (ref >60)
GFR, EST NON AFRICAN AMERICAN: 48 mL/min — AB (ref >60)
Glucose, Bld: 101 mg/dL — ABNORMAL HIGH (ref 65–99)
POTASSIUM: 3.7 mmol/L (ref 3.5–5.1)
SODIUM: 136 mmol/L (ref 135–145)

## 2017-10-22 MED ORDER — FUROSEMIDE 20 MG PO TABS: 60.0000 mg | ORAL_TABLET | Freq: Two times a day (BID) | ORAL | 6 refills | Status: DC

## 2017-10-22 MED ORDER — POTASSIUM CHLORIDE CRYS ER 20 MEQ PO TBCR
20.0000 meq | EXTENDED_RELEASE_TABLET | Freq: Once | ORAL | Status: AC
  Administered 2017-10-22: 20 meq via ORAL
  Filled 2017-10-22: qty 1

## 2017-10-22 MED ORDER — AMIODARONE HCL 200 MG PO TABS: 200.0000 mg | ORAL_TABLET | Freq: Every day | ORAL | 6 refills | Status: DC

## 2017-10-22 MED ORDER — VANCOMYCIN HCL 125 MG PO CAPS
ORAL_CAPSULE | ORAL | 0 refills | Status: DC
Start: 2017-10-22 — End: 2017-11-18

## 2017-10-22 MED ORDER — POTASSIUM CHLORIDE CRYS ER 20 MEQ PO TBCR: 40.0000 meq | EXTENDED_RELEASE_TABLET | Freq: Every day | ORAL | 3 refills | Status: DC

## 2017-10-22 MED ORDER — METRONIDAZOLE 500 MG PO TABS: 500.0000 mg | ORAL_TABLET | Freq: Three times a day (TID) | ORAL | 0 refills | Status: AC

## 2017-10-22 MED ORDER — GABAPENTIN 300 MG PO CAPS: 300.0000 mg | ORAL_CAPSULE | Freq: Three times a day (TID) | ORAL | 3 refills | Status: DC

## 2017-10-22 MED ORDER — FUROSEMIDE 40 MG PO TABS: 60.0000 mg | ORAL_TABLET | Freq: Two times a day (BID) | ORAL | Status: DC

## 2017-10-22 MED FILL — metroNIDAZOLE 500 MG TABS: 500 | 6 days supply | Qty: 16 | Fill #0

## 2017-10-22 MED FILL — FUROSEMIDE 20 MG TABS: 20 | 30 days supply | Qty: 180 | Fill #0

## 2017-10-22 MED FILL — VANCOMYCIN HCL 125 MG CAP: 125 | 28 days supply | Qty: 27 | Fill #0

## 2017-10-22 MED FILL — POTASSIUM CL ER 20 MEQ TABL: 20 | 30 days supply | Qty: 60 | Fill #0

## 2017-10-22 MED FILL — AMIODARONE HCL 200 MG TAB: 200 | 30 days supply | Qty: 30 | Fill #0

## 2017-10-22 MED FILL — GABAPENTIN 300 MG CAPSULE: 300 | 30 days supply | Qty: 90 | Fill #0

## 2017-10-22 NOTE — Progress Notes (Unsigned)
kub

## 2017-10-22 NOTE — Progress Notes (Signed)
Patient is discharging home today with HHC services provided by [facility]; Kandee Keen with [facility] called and is aware of discharge home today; [facility] letter given to patient for his medication with explanation of usage; he will go to the [facility] to get his medication ( his $3 copay for medication will be overridden due to financial circumstances). His parents will take him home today. Abelino Derrick [facility] [phone]

## 2017-10-22 NOTE — Discharge Summary (Signed)
Advanced Heart Failure Discharge Note  Discharge Summary   Patient ID: Marcus [name] MRN: [id number], DOB/AGE: 1965/07/13 52 y.o. Admit date: 09/30/2017 D/C date:     10/22/2017   Primary Discharge Diagnoses:  1. Acute diastolic CHF: Due to severe mitral regurgitation from perforated anterior leaflet.  2. Severe mitral regurgitation 3. Aflutter with RVR 4. 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 5. H/o septic arthritis: Left knee  6. Right pleural effusion 7. Polysubstance abuse 8. AKI on CKD: Stage 3.  9. Diarrhea  Hospital Course:   Marcus [name] is a 52 y.o. 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   Problem based hospital course with plan as below. For more in-depth information, please see previous progress notes.   1. Acute diastolic CHF:  - Due to severe mitral regurgitation from perforated anterior leaflet.  - He was admitted markedly volume overloaded on exam. Symptoms were baseline, NYHA class III, likely due to a combination of deconditioning and CHF. Echo showed that LV EF remains normal at 55-60%. However, RV has mild to moderate systolic dysfunction. RHC 10/01/17 mildly elevated PCWP and pulmonary venous hypertension. Relatively preserved CO.  - Volume status improved greatly with diuresis. Complicated by C. Diff colitis and diarrhea.  - Stabilized on po lasix prior to discharge.  2. Severe mitral regurgitation: Due to prior endocarditis with MSSA.  - By Echo 09/29/17 and TEE 08/14/17, he has a large anterior leaflet perforation and probably a chordal rupture.  - Initially planned for surgery this admission, but postponed due to recurrence of  C-Diff colitis. He has mostly improved, but surgery deferred with high risk of infection. Tentatively plan surgery early/mid June pending TCTS outpatient follow up. Will need to see TCTS in next 1-2 weeks.  3. Aflutter with RVR:    - Converted to NSR on amiodarone gtt, and maintained with transition to po.  4. 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 He has had a full course of antibiotic treatment and PICC removed.  - Had recurrent C. Diff colitis this admission as below.  - ABX course as below.  5. H/o septic arthritis: Left knee  - This significantly limits his mobility. Working with PT/OT. Will need PT/OT for home if can arrange.  6. Right pleural effusion:  - Moderate on CXR this admission. Chronic, likely due to CHF.  - Continue to follow. Could consider Thoracentesis as needed.  7. Polysubstance abuse: - Prior use of opioids. Encouraged complete cessation throughout his stay.  8. AKI on CKD: Stage 3.  - Creatinine followed closely throughout admission with diuresis. Stable on discharge.  9. Diarrhea:  - He had C difficile back in March.  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  - GI and ID both followed.  - Will complete 2 weeks of Flagyl (10/27/17) and 6 weeks oral Vancomycin (11/23/17)  Plan for home today with very close follow up as below.   Discharge Weight Range: 162 lbs Discharge Vitals: Blood pressure (!) 91/59, pulse 86, temperature 97.8 F (36.6 C), temperature source Oral, resp. rate 16, height  (1.803 m), weight 162 lb 12.8 oz (73.8 kg), SpO2 92 %.  Labs: Lab Results  Component Value Date   WBC  11.2 (H) 10/21/2017   HGB 9.7 (L) 10/21/2017   HCT 31.2 (L) 10/21/2017   MCV 78.6 10/21/2017   PLT 379 10/21/2017    Recent Labs  Lab 10/22/17 0539  NA 136  K 3.7  CL 99*  CO2 27  BUN 27*  CREATININE 1.59*  CALCIUM 8.7*  GLUCOSE 101*   Lab Results  Component Value Date   CHOL 154 09/25/2017   HDL 51  09/25/2017   LDLCALC 89 09/25/2017   TRIG 71 09/25/2017   BNP (last 3 results) Recent Labs    08/09/17 1607 09/30/17 1500  BNP 449.0* 706.9*    ProBNP (last 3 results) Recent Labs    09/29/17 1350  PROBNP 13,809*     Diagnostic Studies/Procedures   No results found.  Discharge Medications   Allergies as of 10/22/2017   No Known Allergies     Medication List    STOP taking these medications   hydrochlorothiazide 25 MG tablet Commonly known as:  HYDRODIURIL     TAKE these medications   acetaminophen 325 MG tablet Commonly known as:  TYLENOL Take 650 mg by mouth every 6 (six) hours as needed for mild pain or fever.   amiodarone 200 MG tablet Commonly known as:  PACERONE Take 1 tablet (200 mg total) by mouth daily. Start taking on:  10/23/2017   aspirin 81 MG EC tablet Take 1 tablet (81 mg total) by mouth daily.   atorvastatin 20 MG tablet Commonly known as:  LIPITOR Take 1 tablet (20 mg total) by mouth daily.   furosemide 20 MG tablet Commonly known as:  LASIX Take 3 tablets (60 mg total) by mouth 2 (two) times daily.   gabapentin 300 MG capsule Commonly known as:  NEURONTIN Take 1 capsule (300 mg total) by mouth 3 (three) times daily. What changed:    how much to take  how to take this  when to take this  additional instructions   metroNIDAZOLE 500 MG tablet Commonly known as:  FLAGYL Take 1 tablet (500 mg total) by mouth every 8 (eight) hours for 6 days.   oxyCODONE 5 MG immediate release tablet Commonly known as:  Oxy IR/ROXICODONE Take 5 mg by mouth every 4 (four) hours as needed for severe pain.   potassium chloride SA 20 MEQ tablet Commonly known as:  K-DUR,KLOR-CON Take 2 tablets (40 mEq total) by mouth daily. Start taking on:  10/23/2017   thiamine 100 MG tablet Take 1 tablet (100 mg total) by mouth daily.   vancomycin 125 MG capsule Commonly known as:  VANCOCIN HCL 1 capsule twice a day x7 days, then 1 capsule daily x7 days,  then 1 capsule every other day x7 days, then 1 capsule every THIRD day x7 days            Durable Medical Equipment  (From admission, onward)        Start     Ordered   10/22/17 1004  Heart failure home health orders  (Heart failure home health orders / Face to face)  Once    Comments:  Heart Failure Follow-up Care:  Verify follow-up appointments per Patient Discharge Instructions. Confirm transportation arranged. Reconcile home medications with discharge medication list. Remove discontinued medications from use. Assist patient/caregiver to manage medications using pill box. Reinforce low sodium food selection Assessments: Vital signs and oxygen saturation at each visit. Assess home environment for safety concerns, caregiver support and availability of low-sodium foods. Consult Child psychotherapist, PT/OT, Data processing manager, and CNA  based on assessments. Perform comprehensive cardiopulmonary assessment. Notify MD for any change in condition or weight gain of 3 pounds in one day or 5 pounds in one week with symptoms. Daily Weights and Symptom Monitoring: Ensure patient has access to scales. Teach patient/caregiver to weigh daily before breakfast and after voiding using same scale and record.    Teach patient/caregiver to track weight and symptoms and when to notify Provider. Activity: Develop individualized activity plan with patient/caregiver.   Question Answer Comment  Heart Failure Follow-up Care Advanced [facility] ([facility]F) [facility] at [phone]   Obtain the following labs Other see comments   Lab frequency Other see comments   Fax lab results to AHF Clinic at [phone]   Diet Low Sodium Heart Healthy   Fluid restrictions: 2000 mL Fluid      10/22/17 1003      Disposition   The patient will be discharged in tenuous condition to home.  Discharge Instructions    (HEART FAILURE PATIENTS) Call MD:  Anytime you have any of the following symptoms: 1) 3 pound weight gain in 24 hours  or 5 pounds in 1 week 2) shortness of breath, with or without a dry hacking cough 3) swelling in the hands, feet or stomach 4) if you have to sleep on extra pillows at night in order to breathe.   Complete by:  As directed    Diet - low sodium heart healthy   Complete by:  As directed    Increase activity slowly   Complete by:  As directed    STOP any activity that causes chest pain, shortness of breath, dizziness, sweating, or exessive weakness   Complete by:  As directed      Follow-up Information    Graciella Freer, PA-C Follow up on 10/31/2017.   Specialty:  Physician Assistant Why:  Heart Failure Followup-10 am-Parking at ER lot (enter under blue "Specialty Clinics" awning) or under [facility] on [address], historic buildings entrance, garage code: 1200, elevator to 1st floor). Take all am meds, bring all med bottles. Contact information: [address] [place] [address] [phone]        Purcell Nails, MD Follow up in 2 week(s).   Specialty:  Cardiothoracic Surgery Why:  Please call on 10/27/17 if you have not yet heard from them about an appointment.  Contact information: [address] [employer] [address] [place] [address] [phone]             Duration of Discharge Encounter: Greater than 35 minutes   Signed, Graciella Freer, PA-C 10/22/2017, 1:57 PM

## 2017-10-22 NOTE — Progress Notes (Addendum)
Patient ID: Marcus [name], male   DOB: 1965/07/27, 52 y.o.   MRN: [id number]     Advanced Heart Failure Rounding Note  PCP-Cardiologist: Armanda Magic, MD  HF Cardiology: Shirlee Latch  Subjective:    PT and Dr Allena Katz from rehab recommending SNF.   Weight stable. Creatinine 1.5.   Surgery post-poned due to C-Diff. In discussions for timing with TCTS. Will need outpatient visit next week.   Feeling good this am. Denies SOB. Wants to go home. No more loose stools.   GI recommended sigmoidoscopy but pt refused. KUB with mild gaseous enlargement of the colon with mod/large amount of stool. They recommend oral vancomycin through surgery (even via feeding tube) as long as he is requiring post-op ABX.   + C-Diff test 10/08/17. Started on Vancomycin.  Completing 6 weeks of vancomycin.  Started Flagyl 5/14.  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 + Diffuse colonic wall thickening consistent with colitis.   RHC 10/01/17 RHC Procedural Findings: Hemodynamics (mmHg) RA mean 11 RV 47/15 PA 49/25, mean 38 PCWP mean 19, v-waves to 35 Oxygen saturations: PA 52% AO 94% Cardiac Output (Fick) 4.35  Cardiac Index (Fick) 2.35  PVR 4.4 WU  Objective:   Weight Range: 162 lb 12.8 oz (73.8 kg) Body mass index is 22.71 kg/m.   Vital Signs:   Temp:  [97.8 F (36.6 C)] 97.8 F (36.6 C) (05/22 0525) Pulse Rate:  [86-92] 86 (05/22 0525) Resp:  [16-18] 16 (05/22 0525) BP: (92-113)/(73-83) 105/83 (05/22 0525) SpO2:  [94 %-97 %] 97 % (05/22 0525) Weight:  [162 lb 12.8 oz (73.8 kg)] 162 lb 12.8 oz (73.8 kg) (05/22 0525) Last BM Date: 10/21/17  Weight change: Filed Weights   10/20/17 0457 10/21/17 0642 10/22/17 0525  Weight: 162 lb 6.4 oz (73.7 kg) 160 lb 14.4 oz (73 kg) 162 lb 12.8 oz (73.8 kg)    Intake/Output:   Intake/Output Summary (Last 24 hours) at 10/22/2017 0941 Last data filed at 10/22/2017 0528 Gross per 24 hour    Intake 483 ml  Output 1550 ml  Net -1067 ml      Physical Exam   General: NAD.  HEENT: Normal Neck: Supple. JVP 7-8 cm. Carotids 2+ bilat; no bruits. No thyromegaly or nodule noted. Cor: PMI nondisplaced. RRR, 3/6 HSM Apex Lungs: CTAB, normal effort. Abdomen: Soft, non-tender, non-distended, no HSM. No bruits or masses. +BS  Extremities: No cyanosis, clubbing, or rash. BLE trace to 1+ edema.  Neuro: Alert & orientedx3, cranial nerves grossly intact. moves all 4 extremities w/o difficulty. Affect pleasant   Telemetry   NSR 70-80s, personally reviewed.   Labs    CBC Recent Labs    10/20/17 0659 10/21/17 0516  WBC 12.3* 11.2*  NEUTROABS  --  6.7  HGB 9.5* 9.7*  HCT 30.1* 31.2*  MCV 78.6 78.6  PLT 378 379   Basic Metabolic Panel Recent Labs    95/28/41 0516 10/22/17 0539  NA 136 136  K 3.8 3.7  CL 99* 99*  CO2 29 27  GLUCOSE 99 101*  BUN 26* 27*  CREATININE 1.57* 1.59*  CALCIUM 8.8* 8.7*   Liver Function Tests No results for input(s): AST, ALT, ALKPHOS, BILITOT, PROT, ALBUMIN in the last 72 hours. No results for input(s): LIPASE, AMYLASE in the last 72 hours. Cardiac Enzymes No results for input(s): CKTOTAL, CKMB, CKMBINDEX, TROPONINI in the last 72 hours.  BNP: BNP (last 3 results) Recent Labs  08/09/17 1607 09/30/17 1500  BNP 449.0* 706.9*    ProBNP (last 3 results) Recent Labs    09/29/17 1350  PROBNP 13,809*     D-Dimer No results for input(s): DDIMER in the last 72 hours. Hemoglobin A1C No results for input(s): HGBA1C in the last 72 hours. Fasting Lipid Panel No results for input(s): CHOL, HDL, LDLCALC, TRIG, CHOLHDL, LDLDIRECT in the last 72 hours. Thyroid Function Tests No results for input(s): TSH, T4TOTAL, T3FREE, THYROIDAB in the last 72 hours.  Invalid input(s): FREET3  Other results:   Imaging    No results found.   Medications:     Scheduled Medications: . amiodarone  200 mg Oral Daily  . chlorhexidine  60  mL Topical Once  . furosemide  60 mg Oral BID  . gabapentin  300 mg Oral TID  . metroNIDAZOLE  500 mg Oral Q8H  . potassium chloride  40 mEq Oral Daily  . sodium chloride flush  3 mL Intravenous Q12H  . sodium chloride flush  3 mL Intravenous Q12H  . thiamine  100 mg Oral Daily  . vancomycin  125 mg Oral QID   Followed by  . vancomycin  125 mg Oral BID   Followed by  . [START ON 10/30/2017] vancomycin  125 mg Oral Daily   Followed by  . [START ON 11/06/2017] vancomycin  125 mg Oral QODAY   Followed by  . [START ON 11/14/2017] vancomycin  125 mg Oral Q3 days    Infusions: . sodium chloride    . sodium chloride      PRN Medications: sodium chloride, sodium chloride, acetaminophen, iopamidol, ondansetron (ZOFRAN) IV, oxyCODONE, sodium chloride flush, sodium chloride flush   Patient Profile   Marcus [name] is a 52 y.o. 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   Assessment/Plan   1. Acute diastolic CHF: Due to severe mitral regurgitation from perforated anterior leaflet.  He was admitted markedly volume overloaded on exam.  Symptoms were baseline, NYHA class III, likely due to a combination of deconditioning and CHF.  Echo showed that LV EF remains normal at 55-60%.  However, RV has mild to moderate systolic dysfunction. RHC 10/01/17 mildly elevated PCWP and pulmonary venous hypertension. Relatively preserved CO.  - Volume status improved after IV lasix yesterday - Increase lasix to 60 mg po BID as planned. 2. Severe mitral regurgitation: Due to prior endocarditis with MSSA. 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  - Surgery postponed due to improving colitis.  Looking at as early as next week vs into June.  - BP too soft to add afterload reduction. SBP ~90 - MV repair on hold with C-Diff colitis. -> Tentative plan for MV repair/replacement. Surgery to advise.  - Home today. Will need to see TCTS in next 1-2 weeks for surgery planning.  3. Aflutter with RVR:    - Maintaining NSR on po amio 200 mg daily.  - No change to current plan.   4. 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  He has had a full course of antibiotic treatment and PICC is now out.  Now with recurrent C difficile colitis.  - WBC has stabilized.  -  On flagyl.  - On po vanc for 6 weeks.    5. H/o septic arthritis: Left knee  - This significantly limits his mobility. Working with PT/OT. - No change to current plan.   6. Right pleural effusion:  - Moderate on CXR this admission.  Chronic, likely due to CHF.  - Could consider Thoracentesis.  - No change to current plan.   7. Polysubstance abuse: - Prior use of opioids. Encouraged complete cessation.  - No change to current plan.   8. AKI on CKD: Stage 3.  - Creatinine stable at 1.59.  9. Diarrhea: He had C difficile back in March.  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  - Less frequent BMs, but still loose.  - Now on po vanc with taper per ID.  Also on flagyl.  - Will need to continue po vanc per GI.  - No change to current plan.   10. Disposition - He needs SNF. Case manager to see today to discuss options without insurance.  - Surgery postponed. Working with TCTS for scheduling.   Plan for home today. Working with HF Navigator and Case Management for his medications.   Have confirmed oral vanc is in stock at [facility]  Length of Stay: [address] [place], [place]  10/22/2017, 9:41 AM  Advanced Heart Failure Team Pager [phone] (M-F; 7a - 4p)  Please contact [facility] for night-coverage after hours (4p -7a ) and weekends on  [email or web address]  Patient seen with PA, agree with the above note.    Stable today, gradual improvement in GI symptoms.  Home today with plans for total 2 wks Flagyl and 6 wk po vancomycin taper.  Will need home PT.  Will have followup next week with Dr. Cornelius Moras to decide on surgical timing.   Will send home on Lasix 60 mg po bid + KCl 40 daily.   Marca Ancona 10/22/2017 10:01 AM

## 2017-10-23 ENCOUNTER — Telehealth (INDEPENDENT_AMBULATORY_CARE_PROVIDER_SITE_OTHER): Payer: Self-pay | Admitting: Physician Assistant

## 2017-10-23 NOTE — Telephone Encounter (Signed)
Agree. Script denied

## 2017-10-23 NOTE — Telephone Encounter (Signed)
Mother LVM because she has medication questions,She states that after pt's ED visit yesterday they told him his PCP will have to give him his pain medications.due to him not receiving anything he could not sleep at night. Please follow up [phone]

## 2017-10-23 NOTE — Telephone Encounter (Signed)
Patients mother states patient was discharged but hospital did not Rx his oxycodone, advised that patient returns to PCP for a script for the oxycodone. Informed patients mother that the patients PCP is out of office and the covering provider will not prescribe oxycodone. Maryjean Morn, CMA

## 2017-10-31 ENCOUNTER — Encounter (HOSPITAL_COMMUNITY): Payer: Self-pay

## 2017-10-31 DIAGNOSIS — Z736 Limitation of activities due to disability: Secondary | ICD-10-CM

## 2017-11-04 ENCOUNTER — Ambulatory Visit: Payer: Medicaid Other | Admitting: Thoracic Surgery (Cardiothoracic Vascular Surgery)

## 2017-11-06 ENCOUNTER — Other Ambulatory Visit: Payer: Self-pay | Admitting: *Deleted

## 2017-11-06 ENCOUNTER — Ambulatory Visit (INDEPENDENT_AMBULATORY_CARE_PROVIDER_SITE_OTHER): Payer: Medicaid Other | Admitting: Thoracic Surgery (Cardiothoracic Vascular Surgery)

## 2017-11-06 ENCOUNTER — Encounter: Payer: Self-pay | Admitting: Thoracic Surgery (Cardiothoracic Vascular Surgery)

## 2017-11-06 ENCOUNTER — Ambulatory Visit
Admission: RE | Admit: 2017-11-06 | Discharge: 2017-11-06 | Disposition: A | Discharge status: Home / Self Care | Payer: Medicaid Other | Source: Ambulatory Visit | Attending: Thoracic Surgery (Cardiothoracic Vascular Surgery) | Admitting: Thoracic Surgery (Cardiothoracic Vascular Surgery)

## 2017-11-06 ENCOUNTER — Other Ambulatory Visit: Payer: Self-pay

## 2017-11-06 VITALS — BP 103/73 | HR 92 | Resp 18 | Ht 71.0 in | Wt 132.0 lb

## 2017-11-06 DIAGNOSIS — J90 Pleural effusion, not elsewhere classified: Secondary | ICD-10-CM

## 2017-11-06 DIAGNOSIS — I340 Nonrheumatic mitral (valve) insufficiency: Secondary | ICD-10-CM

## 2017-11-06 DIAGNOSIS — K529 Noninfective gastroenteritis and colitis, unspecified: Secondary | ICD-10-CM

## 2017-11-06 NOTE — Patient Instructions (Signed)
Continue all previous medications without any changes at this time  

## 2017-11-06 NOTE — Progress Notes (Addendum)
[address]       [place], [id number]             [phone]     CARDIOTHORACIC SURGERY OFFICE NOTE  Referring Provider isTurner, Cornelious Bryantraci R, MD Advanced Heart Failure Cardiologist is Marca AnconaMcLean, Dalton, MD Infectious Disease Consultants include Cliffton Astersampbell, John, MD Orthopedic Surgeon is Tarry KosXu, Naiping M, MD Neurologist is George HughVanSchaick, Jessica, NP PCP is Loletta SpecterGomez, Roger David, PA-C   HPI:  Patient is a 52 year old male with history of polysubstance abuse, hepatitis C positive, recurrent [name] colitis, and stage D severe symptomatic primary mitral regurgitation secondary to bacterial endocarditis who returns to the office today for follow-up.  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 Hewasreadmitted to the hospital August 10, 2017 with acute hypoxemic respiratory failure secondary to acute congestive heart failure with bilateral pleural effusions and possible pneumonia.  I had the opportunity to evaluate him in consultation at that time (full consult note 08/14/2017).  He was severely debilitated and his hospitalization was complicated by the development of [name] colitis.  His heart failure was treated with medical therapy with considerable improvement.  He underwent dental extraction and eventually was discharged from the hospital.  He completed his course of IV antibiotics and was treated with oral antibiotics for an additional 2 weeks.  He was last seen here in our office on September 29, 2017 at which time we discussed proceeding with elective mitral valve repair or replacement.  At the time he appeared to be in decompensated  congestive heart failure, and he was hospitalized from September 30, 2017 through Oct 22, 2017.  Initially the patient's heart failure was treated under the care and direction of Dr. Shirlee LatchMcLean in the advanced heart failure team.  However, the patient subsequently developed recurrent [name] colitis which really set him back clinically.  He slowly began to recover, but his diarrhea persisted for more than 2 weeks.  He was seen in consultation during his hospitalization by Dr. Ewing SchleinMagod from gastroenterology, and further delay for elective surgical intervention was recommended to allow the patient to more fully recover from his colitis.    The patient returns to our office today for follow-up.  He reports that immediately after hospital discharge she went through a period of narcotic withdrawal because he had run out of oxycodone tablets.  He is asking for more oxycodone today.  He states that his bowel function is normalized but he is still not eating well.  He states that he feels like he is slowly starting to improve and he plans to get fitted for dentures soon.  He has stable symptoms of exertional shortness of breath and chronic lower extremity edema.  He states that his breathing has not gotten any worse.  He reports that his weight has been stable.  He denies any fevers chills or productive cough.   Current Outpatient Medications  Medication Sig Dispense Refill  . acetaminophen (TYLENOL) 325 MG tablet Take 650 mg by mouth every 6 (six) hours as needed for mild pain or fever.    Marland Kitchen. amiodarone (PACERONE) 200 MG tablet Take 1 tablet (200 mg total) by mouth daily. 30 tablet 6  . aspirin 81 MG EC  tablet Take 1 tablet (81 mg total) by mouth daily. 15 tablet 0  . atorvastatin (LIPITOR) 20 MG tablet Take 1 tablet (20 mg total) by mouth daily. 30 tablet 4  . furosemide (LASIX) 20 MG tablet Take 3 tablets (60 mg total) by mouth 2 (two) times daily. 240 tablet 6  . gabapentin (NEURONTIN) 300 MG capsule Take 1 capsule  (300 mg total) by mouth 3 (three) times daily. 90 capsule 3  . oxyCODONE (OXY IR/ROXICODONE) 5 MG immediate release tablet Take 5 mg by mouth every 4 (four) hours as needed for severe pain.    . potassium chloride SA (K-DUR,KLOR-CON) 20 MEQ tablet Take 2 tablets (40 mEq total) by mouth daily. 60 tablet 3  . thiamine 100 MG tablet Take 1 tablet (100 mg total) by mouth daily. 30 tablet 0  . vancomycin (VANCOCIN HCL) 125 MG capsule 1 capsule twice a day x7 days, then 1 capsule daily x7 days, then 1 capsule every other day x7 days, then 1 capsule every THIRD day x7 days 27 capsule 0   No current facility-administered medications for this visit.       Physical Exam:   BP 103/73 (BP Location: Right Arm, Patient Position: Sitting, Cuff Size: Normal)   Pulse 92   Resp 18   Ht 5\' 11"  (1.803 m)   Wt 132 lb (59.9 kg)   SpO2 91% Comment: RA  BMI 18.41 kg/m   General:  Chronically debilitated  Chest:   Diminished breath sounds right lung base  CV:   Regular rate and rhythm with prominent holosystolic murmur  Incisions:  n/a  Abdomen:  Soft nontender  Extremities:  Warm and well-perfused, moderate bilateral lower extremity edema  Diagnostic Tests:  CHEST - 2 VIEW  COMPARISON:  Radiographs of Oct 20, 2017.  FINDINGS: Stable cardiomegaly. No pneumothorax is noted. Stable moderate right pleural effusion is noted which may be loculated with associated atelectasis or scarring. Mild left pleural effusion is noted. Bony thorax is unremarkable.  IMPRESSION: Bilateral pleural effusions are noted, right greater than left. Stable cardiomegaly with central pulmonary vascular congestion.   Electronically Signed   By: Lupita Raider, M.D.   On: 11/06/2017 14:58   ABDOMEN - 1 VIEW  COMPARISON:  Radiographs of Oct 19, 2017.  FINDINGS: No abnormal bowel dilatation is noted. Moderate amount of stool is noted in the colon. No radio-opaque calculi or other significant radiographic  abnormality are seen.  IMPRESSION: Moderate stool burden.  No abnormal bowel dilatation is noted.   Electronically Signed   By: Lupita Raider, M.D.   On: 11/06/2017 14:57    Impression:  Patient has stage D severe symptomatic mitral regurgitation and needs mitral valve repair or replacement at some point in the near future.  He appears to be recovering from his recent bout of recurrent [name] colitis, but he admits that he is still not back to feeling as well as he did at the end of April.  His congestive heart failure appears to be stable on medical therapy.  He still has a large right pleural effusion and small left pleural effusion.  Plan:  We have discussed timing of surgical intervention.  I agree that the patient probably needs at least a couple more weeks of improved nutrition to facilitate some degree of strengthening before we proceed with surgical intervention.  He is scheduled to see Dr. Shirlee Latch for follow-up tomorrow, but overall he appears reasonably stable from a cardiac standpoint.  We  will send him for therapeutic right needle thoracentesis to drain as much pleural fluid is possible.  I think this may help him considerably in the short-term.  We tentatively plan to proceed with surgery on December 09, 2017 if the patient makes satisfactory progress over the next few weeks.  He will return to our office for follow-up on 12-17-17.  All questions answered.  I spent in excess of 15 minutes during the conduct of this office consultation and >50% of this time involved direct face-to-face encounter with the patient for counseling and/or coordination of their care.   Salvatore Decent. Cornelius Moras, MD 11/06/2017 3:14 PM

## 2017-11-07 ENCOUNTER — Ambulatory Visit (HOSPITAL_COMMUNITY)
Admission: RE | Admit: 2017-11-07 | Discharge: 2017-11-07 | Disposition: A | Discharge status: Home / Self Care | Payer: Medicaid Other | Source: Ambulatory Visit | Attending: Student | Admitting: Student

## 2017-11-07 ENCOUNTER — Ambulatory Visit (HOSPITAL_COMMUNITY)
Admission: RE | Admit: 2017-11-07 | Discharge: 2017-11-07 | Disposition: A | Discharge status: Home / Self Care | Payer: Medicaid Other | Source: Ambulatory Visit | Attending: Thoracic Surgery (Cardiothoracic Vascular Surgery) | Admitting: Thoracic Surgery (Cardiothoracic Vascular Surgery)

## 2017-11-07 ENCOUNTER — Ambulatory Visit (HOSPITAL_COMMUNITY)
Admission: RE | Admit: 2017-11-07 | Discharge: 2017-11-07 | Disposition: A | Discharge status: Home / Self Care | Payer: Medicaid Other | Source: Ambulatory Visit | Attending: Cardiology | Admitting: Cardiology

## 2017-11-07 ENCOUNTER — Other Ambulatory Visit (HOSPITAL_COMMUNITY): Payer: Self-pay | Admitting: Student

## 2017-11-07 ENCOUNTER — Encounter (HOSPITAL_COMMUNITY): Payer: Self-pay | Admitting: Student

## 2017-11-07 ENCOUNTER — Other Ambulatory Visit: Payer: Self-pay | Admitting: *Deleted

## 2017-11-07 ENCOUNTER — Other Ambulatory Visit: Payer: Self-pay

## 2017-11-07 VITALS — BP 110/70 | HR 92 | Wt 168.8 lb

## 2017-11-07 DIAGNOSIS — I38 Endocarditis, valve unspecified: Secondary | ICD-10-CM | POA: Diagnosis not present

## 2017-11-07 DIAGNOSIS — A0472 Enterocolitis due to Clostridium difficile, not specified as recurrent: Secondary | ICD-10-CM | POA: Diagnosis not present

## 2017-11-07 DIAGNOSIS — Z79899 Other long term (current) drug therapy: Secondary | ICD-10-CM | POA: Insufficient documentation

## 2017-11-07 DIAGNOSIS — B1920 Unspecified viral hepatitis C without hepatic coma: Secondary | ICD-10-CM | POA: Diagnosis not present

## 2017-11-07 DIAGNOSIS — I2510 Atherosclerotic heart disease of native coronary artery without angina pectoris: Secondary | ICD-10-CM | POA: Insufficient documentation

## 2017-11-07 DIAGNOSIS — I340 Nonrheumatic mitral (valve) insufficiency: Secondary | ICD-10-CM

## 2017-11-07 DIAGNOSIS — Z7982 Long term (current) use of aspirin: Secondary | ICD-10-CM | POA: Diagnosis not present

## 2017-11-07 DIAGNOSIS — Z8249 Family history of ischemic heart disease and other diseases of the circulatory system: Secondary | ICD-10-CM | POA: Insufficient documentation

## 2017-11-07 DIAGNOSIS — R7989 Other specified abnormal findings of blood chemistry: Secondary | ICD-10-CM

## 2017-11-07 DIAGNOSIS — B9689 Other specified bacterial agents as the cause of diseases classified elsewhere: Secondary | ICD-10-CM | POA: Diagnosis not present

## 2017-11-07 DIAGNOSIS — I4892 Unspecified atrial flutter: Secondary | ICD-10-CM | POA: Diagnosis not present

## 2017-11-07 DIAGNOSIS — I5032 Chronic diastolic (congestive) heart failure: Secondary | ICD-10-CM | POA: Diagnosis not present

## 2017-11-07 DIAGNOSIS — I4581 Long QT syndrome: Secondary | ICD-10-CM | POA: Insufficient documentation

## 2017-11-07 DIAGNOSIS — J90 Pleural effusion, not elsewhere classified: Secondary | ICD-10-CM | POA: Insufficient documentation

## 2017-11-07 DIAGNOSIS — Z87891 Personal history of nicotine dependence: Secondary | ICD-10-CM | POA: Diagnosis not present

## 2017-11-07 DIAGNOSIS — M00862 Arthritis due to other bacteria, left knee: Secondary | ICD-10-CM | POA: Diagnosis not present

## 2017-11-07 DIAGNOSIS — I130 Hypertensive heart and chronic kidney disease with heart failure and stage 1 through stage 4 chronic kidney disease, or unspecified chronic kidney disease: Secondary | ICD-10-CM | POA: Diagnosis not present

## 2017-11-07 HISTORY — PX: IR THORACENTESIS ASP PLEURAL SPACE W/IMG GUIDE: IMG5380

## 2017-11-07 LAB — COMPREHENSIVE METABOLIC PANEL
ALT: 165 U/L — ABNORMAL HIGH (ref 17–63)
ANION GAP: 12 (ref 5–15)
AST: 44 U/L — ABNORMAL HIGH (ref 15–41)
Albumin: 3 g/dL — ABNORMAL LOW (ref 3.5–5.0)
Alkaline Phosphatase: 217 U/L — ABNORMAL HIGH (ref 38–126)
BILIRUBIN TOTAL: 1.1 mg/dL (ref 0.3–1.2)
BUN: 28 mg/dL — ABNORMAL HIGH (ref 6–20)
CHLORIDE: 97 mmol/L — AB (ref 101–111)
CO2: 25 mmol/L (ref 22–32)
Calcium: 8.4 mg/dL — ABNORMAL LOW (ref 8.9–10.3)
Creatinine, Ser: 1.64 mg/dL — ABNORMAL HIGH (ref 0.61–1.24)
GFR, EST AFRICAN AMERICAN: 54 mL/min — AB (ref >60)
GFR, EST NON AFRICAN AMERICAN: 47 mL/min — AB (ref >60)
Glucose, Bld: 119 mg/dL — ABNORMAL HIGH (ref 65–99)
POTASSIUM: 3.1 mmol/L — AB (ref 3.5–5.1)
Sodium: 134 mmol/L — ABNORMAL LOW (ref 135–145)
TOTAL PROTEIN: 7.8 g/dL (ref 6.5–8.1)

## 2017-11-07 LAB — TSH: TSH: 9.049 u[IU]/mL — AB (ref 0.350–4.500)

## 2017-11-07 MED ORDER — LIDOCAINE HCL (PF) 2 % IJ SOLN
INTRAMUSCULAR | Status: AC
  Filled 2017-11-07: qty 20

## 2017-11-07 MED ORDER — LIDOCAINE HCL (PF) 1 % IJ SOLN
INTRAMUSCULAR | Status: DC | PRN
  Administered 2017-11-07: 5 mL

## 2017-11-07 MED ORDER — TORSEMIDE 20 MG PO TABS: 40.0000 mg | ORAL_TABLET | Freq: Two times a day (BID) | ORAL | 2 refills | Status: DC

## 2017-11-07 NOTE — Procedures (Signed)
PROCEDURE SUMMARY:  Successful US guided diagnostic and therapeutic thoracentesis. Yielded 600 mL of amber fluid. Pt tolerated procedure well. No immediate complications.  Specimen was not sent for labs. CXR ordered.  Hoyt KochKacie Sue-Ellen Matthews PA-C 11/07/2017 2:57 PM

## 2017-11-07 NOTE — Patient Instructions (Addendum)
Stop Furosemide   Start Torsemide 40 mg (2 tabs) daily  Wear Compression stocking wear during day Rx given   Your physician recommends that you schedule a follow-up appointment in: 10 days with APP and labs

## 2017-11-09 NOTE — Addendum Note (Signed)
Encounter addended by: Laurey MoraleMcLean, Alaysiah Browder S, MD on: 11/09/2017 11:05 PM  Actions taken: Sign clinical note

## 2017-11-09 NOTE — Progress Notes (Addendum)
PCP: Sindy Messing HF Cardiology: Dr. 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   Admitted 1/6-1/21/19 with sepsis. Found to have MSSA bacteremia with endocarditis, embolic strokes, left knee septic arthritis, and AKI. He required a washout of left knee. Echo showed normal EF with vegetation on mitral valve with mild MR. He was discharged with 6 weeks of IV ampicillin per ID.   Admitted 3/9-3/27/19 with hypoxic hypercarbic respiratory failure. Treated for HCAP. Required RLL thoracentesis. 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 Dr Cornelius Moras recommended MV replacement after 6 weeks of antibiotics for MSSA bacterial endocarditis.R/LHC showed mild pulmonary HTN and nonobstructive CAD. He had dental extractions. 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 ID recommended Ancef. Pt was also treated for C diff. He was treated for acute diastolic HF with IV diuretics.   He was readmitted prior to surgery on 09/30/17 with acute on chronic diastolic CHF.  He was diuresed. 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  He was started on Flagyl and vancomycin, he remains on vancomycin taper.   GI symptoms are improved.  No diarrhea or abdominal pain.  He is eating normally. Still with mobility limitations due to left knee pain.  He walks with walker and gets short of breath after longer distances.  He  is getting PT at home.  No orthopnea/PND. No chest pain.   Labs (5/19): K 3.7, creatinine 1.59  ECG (personally reviewed): NSR, right axis deviation, QTc increased  PMH: 1. Chronic diastolic CHF: Due to severe mitral regurgitation from perforated anterior leaflet.  - Echo 10/2017 EF 55-60% with mild/mod RV dysfunction.  - RHC 10/01/17 mildly elevated PCWP and pulmonary venous hypertension. Relatively preserved CO.  2. Mitral regurgitation:Severe.  Due to prior endocarditis with MSSA. By Echo 09/29/17 and TEE 08/14/17, he has a large anterior leaflet perforation and probably a chordal rupture.  3. Aflutter with RVR: Noted during 09/30/17 admission.  - Converted to NSR on amiodarone gtt, and maintained with transition to po. - Anticoagulation was not started due to short duration of flutter, no recurrence, and plan for surgery.  4. 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 5. H/o septic arthritis: Left knee.This significantly limits his mobility.   6. Right pleural effusion 7. Polysubstance abuse: - Prior use of opioids.  8. CKD: Stage 3. 9. Diarrhea/C. 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  10. CAD: LHC (3/19) with 70% small D1.   Social History   Socioeconomic History  . Marital status: Single    Spouse name: Not on file  . Number of children: 4  . Years of education: Not on file  . Highest education level: High school graduate  Occupational History  . Not on file  Social Needs  . Financial resource strain: Not on file  . Food insecurity:    Worry: Not on file  Inability: Not on file  . Transportation needs:    Medical: Not on file    Non-medical: Not on file  Tobacco Use  . Smoking status: Former Smoker    Packs/day: 1.00    Years: 10.00    Pack years: 10.00    Types: Cigarettes    Last attempt to quit: 11/04/2015    Years since quitting: 2.0  . Smokeless tobacco: Never Used  Substance and Sexual  Activity  . Alcohol use: Yes    Comment: occasionally   . Drug use: No  . Sexual activity: Yes  Lifestyle  . Physical activity:    Days per week: Not on file    Minutes per session: Not on file  . Stress: Not on file  Relationships  . Social connections:    Talks on phone: Not on file    Gets together: Not on file    Attends religious service: Not on file    Active member of club or organization: Not on file    Attends meetings of clubs or organizations: Not on file    Relationship status: Not on file  . Intimate partner violence:    Fear of current or ex partner: Not on file    Emotionally abused: Not on file    Physically abused: Not on file    Forced sexual activity: Not on file  Other Topics Concern  . Not on file  Social History Narrative   He is currently living with his mother.   Right handed   Caffeine: occasional   Family History  Problem Relation Age of Onset  . CAD Father    ROS: All systems reviewed and negative except as per HPI.   Current Outpatient Medications  Medication Sig Dispense Refill  . acetaminophen (TYLENOL) 325 MG tablet Take 650 mg by mouth every 6 (six) hours as needed for mild pain or fever.    Marland Kitchen amiodarone (PACERONE) 200 MG tablet Take 1 tablet (200 mg total) by mouth daily. 30 tablet 6  . aspirin 81 MG EC tablet Take 1 tablet (81 mg total) by mouth daily. 15 tablet 0  . atorvastatin (LIPITOR) 20 MG tablet Take 1 tablet (20 mg total) by mouth daily. 30 tablet 4  . gabapentin (NEURONTIN) 300 MG capsule Take 1 capsule (300 mg total) by mouth 3 (three) times daily. 90 capsule 3  . oxyCODONE (OXY IR/ROXICODONE) 5 MG immediate release tablet Take 5 mg by mouth every 4 (four) hours as needed for severe pain.    . potassium chloride SA (K-DUR,KLOR-CON) 20 MEQ tablet Take 2 tablets (40 mEq total) by mouth daily. 60 tablet 3  . thiamine 100 MG tablet Take 1 tablet (100 mg total) by mouth daily. 30 tablet 0  . vancomycin (VANCOCIN HCL) 125 MG capsule  1 capsule twice a day x7 days, then 1 capsule daily x7 days, then 1 capsule every other day x7 days, then 1 capsule every THIRD day x7 days 27 capsule 0  . torsemide (DEMADEX) 20 MG tablet Take 2 tablets (40 mg total) by mouth 2 (two) times daily. 120 tablet 2   No current facility-administered medications for this encounter.    BP 110/70   Pulse 92   Wt 168 lb 12.8 oz (76.6 kg)   SpO2 (!) 88%   BMI 23.54 kg/m  General: NAD Neck: JVP 12-14 cm, no thyromegaly or thyroid nodule.  Lungs: Clear to auscultation bilaterally with normal respiratory effort. CV: Nondisplaced PMI.  Heart regular  S1/S2, no S3/S4, 3/6 HSM apex.  2+ edema to knees.  No carotid bruit.  Normal pedal pulses.  Abdomen: Soft, nontender, no hepatosplenomegaly, no distention.  Skin: Intact without lesions or rashes.  Neurologic: Alert and oriented x 3.  Psych: Normal affect. Extremities: No clubbing or cyanosis.  HEENT: Normal.   Assessment/Plan: 1. Chronic diastolic CHF: This is driven by severe MR. He is volume overloaded on exam today.  Volume will be difficult to control until he has MV surgery.  - Stop Lasix, start torsemide 40 mg bid.  BMET today and again in 10 days.  - Wear compression stockings.  2. C difficile colitis: Recurrent.  He remains on po vancomycin, symptoms have resolved.  3. Prolonged QT interval: Likely related to amiodarone.  Increased QTc in setting of amiodarone does not predict worse outcome.  4. Mitral regurgitation: Perforated MV due to endocarditis with severe MR.  This potentiates CHF. Patient needs MV repair, operation scheduled for 12/09/17.  5. Right pleural effusion: Chronic, thoracentesis scheduled by TCTS.  6. Atrial flutter: Noted in 5/19, converted to NSR on amiodarone.  - Continue po amiodarone.  Will need to follow CMET and TSH (check today).  - Not anticoagulated due to short duration of atrial flutter and need for surgery.  7. CAD: Nonobstructive.  Patient is on a statin.    With volume overload, followup with NP/PA in 10 days.   Marca AnconaDalton Tel Hevia 11/09/2017

## 2017-11-11 DIAGNOSIS — I5032 Chronic diastolic (congestive) heart failure: Secondary | ICD-10-CM

## 2017-11-13 ENCOUNTER — Telehealth (HOSPITAL_COMMUNITY): Payer: Self-pay | Admitting: Cardiology

## 2017-11-13 DIAGNOSIS — R748 Abnormal levels of other serum enzymes: Secondary | ICD-10-CM

## 2017-11-13 MED ORDER — POTASSIUM CHLORIDE CRYS ER 20 MEQ PO TBCR: 40.0000 meq | EXTENDED_RELEASE_TABLET | Freq: Two times a day (BID) | ORAL | 3 refills | Status: AC

## 2017-11-13 NOTE — Telephone Encounter (Signed)
Notes recorded by Laurey MoraleMcLean, Dalton S, MD on 11/10/2017 at 12:22 AM EDT Increase KCl to 40 bid. Hold amiodarone for now with elevated LFTs. Repeat LFTs along with free T3, free T4, and TSH on Friday this week. .     Notes recorded by Theresia BoughJeffries, Chantel M, CMA on 11/13/2017 at 9:46 AM EDT Patient aware, via mother, repeat labs 6/14

## 2017-11-13 NOTE — Telephone Encounter (Signed)
-----   Message from Laurey Moralealton S McLean, MD sent at 11/10/2017 12:22 AM EDT ----- Increase KCl to 40 bid.  Hold amiodarone for now with elevated LFTs.  Repeat LFTs along with free T3, free T4, and TSH on Friday this week. .Marland Kitchen

## 2017-11-14 ENCOUNTER — Other Ambulatory Visit (HOSPITAL_COMMUNITY): Payer: Self-pay

## 2017-11-18 ENCOUNTER — Ambulatory Visit (HOSPITAL_COMMUNITY)
Admission: RE | Admit: 2017-11-18 | Discharge: 2017-11-18 | Disposition: A | Discharge status: Home / Self Care | Payer: Medicaid Other | Source: Ambulatory Visit | Attending: Cardiology | Admitting: Cardiology

## 2017-11-18 VITALS — BP 108/76 | HR 103 | Wt 174.0 lb

## 2017-11-18 DIAGNOSIS — Z79891 Long term (current) use of opiate analgesic: Secondary | ICD-10-CM | POA: Insufficient documentation

## 2017-11-18 DIAGNOSIS — I5032 Chronic diastolic (congestive) heart failure: Secondary | ICD-10-CM | POA: Diagnosis not present

## 2017-11-18 DIAGNOSIS — I5033 Acute on chronic diastolic (congestive) heart failure: Secondary | ICD-10-CM | POA: Diagnosis not present

## 2017-11-18 DIAGNOSIS — I130 Hypertensive heart and chronic kidney disease with heart failure and stage 1 through stage 4 chronic kidney disease, or unspecified chronic kidney disease: Secondary | ICD-10-CM | POA: Diagnosis not present

## 2017-11-18 DIAGNOSIS — M009 Pyogenic arthritis, unspecified: Secondary | ICD-10-CM | POA: Diagnosis not present

## 2017-11-18 DIAGNOSIS — I4581 Long QT syndrome: Secondary | ICD-10-CM | POA: Insufficient documentation

## 2017-11-18 DIAGNOSIS — F1910 Other psychoactive substance abuse, uncomplicated: Secondary | ICD-10-CM

## 2017-11-18 DIAGNOSIS — Z7982 Long term (current) use of aspirin: Secondary | ICD-10-CM | POA: Diagnosis not present

## 2017-11-18 DIAGNOSIS — I340 Nonrheumatic mitral (valve) insufficiency: Secondary | ICD-10-CM

## 2017-11-18 DIAGNOSIS — A0472 Enterocolitis due to Clostridium difficile, not specified as recurrent: Secondary | ICD-10-CM | POA: Diagnosis not present

## 2017-11-18 DIAGNOSIS — B1920 Unspecified viral hepatitis C without hepatic coma: Secondary | ICD-10-CM | POA: Insufficient documentation

## 2017-11-18 DIAGNOSIS — Z79899 Other long term (current) drug therapy: Secondary | ICD-10-CM | POA: Diagnosis not present

## 2017-11-18 DIAGNOSIS — I2510 Atherosclerotic heart disease of native coronary artery without angina pectoris: Secondary | ICD-10-CM | POA: Insufficient documentation

## 2017-11-18 DIAGNOSIS — I6340 Cerebral infarction due to embolism of unspecified cerebral artery: Secondary | ICD-10-CM | POA: Insufficient documentation

## 2017-11-18 DIAGNOSIS — I5030 Unspecified diastolic (congestive) heart failure: Secondary | ICD-10-CM | POA: Insufficient documentation

## 2017-11-18 DIAGNOSIS — I491 Atrial premature depolarization: Secondary | ICD-10-CM | POA: Insufficient documentation

## 2017-11-18 DIAGNOSIS — Z87891 Personal history of nicotine dependence: Secondary | ICD-10-CM | POA: Insufficient documentation

## 2017-11-18 DIAGNOSIS — Z09 Encounter for follow-up examination after completed treatment for conditions other than malignant neoplasm: Secondary | ICD-10-CM | POA: Insufficient documentation

## 2017-11-18 DIAGNOSIS — I4892 Unspecified atrial flutter: Secondary | ICD-10-CM | POA: Diagnosis not present

## 2017-11-18 DIAGNOSIS — I38 Endocarditis, valve unspecified: Secondary | ICD-10-CM | POA: Insufficient documentation

## 2017-11-18 DIAGNOSIS — J90 Pleural effusion, not elsewhere classified: Secondary | ICD-10-CM | POA: Insufficient documentation

## 2017-11-18 DIAGNOSIS — Z8249 Family history of ischemic heart disease and other diseases of the circulatory system: Secondary | ICD-10-CM | POA: Insufficient documentation

## 2017-11-18 LAB — COMPREHENSIVE METABOLIC PANEL
ALBUMIN: 2.9 g/dL — AB (ref 3.5–5.0)
ALK PHOS: 152 U/L — AB (ref 38–126)
ALT: 31 U/L (ref 17–63)
ANION GAP: 8 (ref 5–15)
AST: 25 U/L (ref 15–41)
BUN: 20 mg/dL (ref 6–20)
CALCIUM: 8.7 mg/dL — AB (ref 8.9–10.3)
CHLORIDE: 104 mmol/L (ref 101–111)
CO2: 24 mmol/L (ref 22–32)
Creatinine, Ser: 1.49 mg/dL — ABNORMAL HIGH (ref 0.61–1.24)
GFR calc Af Amer: >60 mL/min (ref >60)
GFR calc non Af Amer: 52 mL/min — ABNORMAL LOW (ref >60)
GLUCOSE: 105 mg/dL — AB (ref 65–99)
POTASSIUM: 4.2 mmol/L (ref 3.5–5.1)
SODIUM: 136 mmol/L (ref 135–145)
Total Bilirubin: 1.3 mg/dL — ABNORMAL HIGH (ref 0.3–1.2)
Total Protein: 7.6 g/dL (ref 6.5–8.1)

## 2017-11-18 LAB — TSH: TSH: 11.738 u[IU]/mL — ABNORMAL HIGH (ref 0.350–4.500)

## 2017-11-18 LAB — BRAIN NATRIURETIC PEPTIDE: B NATRIURETIC PEPTIDE 5: 1309.8 pg/mL — AB (ref 0.0–100.0)

## 2017-11-18 LAB — T4, FREE: Free T4: 1.04 ng/dL (ref 0.82–1.77)

## 2017-11-18 MED ORDER — TORSEMIDE 20 MG PO TABS: 40.0000 mg | ORAL_TABLET | Freq: Two times a day (BID) | ORAL | 11 refills | Status: DC

## 2017-11-18 NOTE — Progress Notes (Signed)
PCP: Sindy Messing HF Cardiology: Dr. 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   Admitted 1/6-1/21/19 with sepsis. Found to have MSSA bacteremia with endocarditis, embolic strokes, left knee septic arthritis, and AKI. He required a washout of left knee. Echo showed normal EF with vegetation on mitral valve with mild MR. He was discharged with 6 weeks of IV ampicillin per ID.   Admitted 3/9-3/27/19 with hypoxic hypercarbic respiratory failure. Treated for HCAP. Required RLL thoracentesis. 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 Dr Cornelius Moras recommended MV replacement after 6 weeks of antibiotics for MSSA bacterial endocarditis.R/LHC showed mild pulmonary HTN and nonobstructive CAD. He had dental extractions. 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 ID recommended Ancef. Pt was also treated for C diff. He was treated for acute diastolic HF with IV diuretics.   He was readmitted prior to surgery on 09/30/17 with acute on chronic diastolic CHF.  He was diuresed. 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  He was started on Flagyl and vancomycin, he remains on vancomycin taper.   Today he returns for HF follow up. Last visit lasix was stopped and torsemide was started. He did not pick up torsemide for a 3 days and has only been taking 40 mg daily instead of twicea day.  Overall feeling  fine. SOB with exertion. +Orthopnea. Denies PND.  Appetite im[proved. Drinking lots of Gatorade and eating lean cuisine most days. No fever or chills. Weight at home has been trending up.    Labs 11/07/2017: K 3.1 Creatinine 1.6  Labs (5/19): K 3.7, creatinine 1.59  PMH: 1. Chronic diastolic CHF: Due to severe mitral regurgitation from perforated anterior leaflet.  - Echo 10/2017 EF 55-60% with mild/mod RV dysfunction.  - RHC 10/01/17 mildly elevated PCWP and pulmonary venous hypertension. Relatively preserved CO.  2. Mitral regurgitation:Severe.  Due to prior endocarditis with MSSA. By Echo 09/29/17 and TEE 08/14/17, he has a large anterior leaflet perforation and probably a chordal rupture.  3. Aflutter with RVR: Noted during 09/30/17 admission.  - Converted to NSR on amiodarone gtt, and maintained with transition to po. - Anticoagulation was not started due to short duration of flutter, no recurrence, and plan for surgery.  4. 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 5. H/o septic arthritis: Left knee.This significantly limits his mobility.   6. Right pleural effusion 7. Polysubstance abuse: - Prior use of opioids.  8. CKD: Stage 3. 9. Diarrhea/C. 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  10. CAD: LHC (3/19) with 70% small D1.   Social History   Socioeconomic History  . Marital status: Single    Spouse name: Not on file  . Number of children: 4  . Years of education: Not on file  . Highest education level: High school graduate  Occupational History  . Not on file  Social Needs  .  Financial resource strain: Not on file  . Food insecurity:    Worry: Not on file    Inability: Not on file  . Transportation needs:    Medical: Not on file    Non-medical: Not on file  Tobacco Use  . Smoking status: Former Smoker    Packs/day: 1.00    Years: 10.00    Pack years: 10.00    Types: Cigarettes    Last attempt to  quit: 11/04/2015    Years since quitting: 2.0  . Smokeless tobacco: Never Used  Substance and Sexual Activity  . Alcohol use: Yes    Comment: occasionally   . Drug use: No  . Sexual activity: Yes  Lifestyle  . Physical activity:    Days per week: Not on file    Minutes per session: Not on file  . Stress: Not on file  Relationships  . Social connections:    Talks on phone: Not on file    Gets together: Not on file    Attends religious service: Not on file    Active member of club or organization: Not on file    Attends meetings of clubs or organizations: Not on file    Relationship status: Not on file  . Intimate partner violence:    Fear of current or ex partner: Not on file    Emotionally abused: Not on file    Physically abused: Not on file    Forced sexual activity: Not on file  Other Topics Concern  . Not on file  Social History Narrative   He is currently living with his mother.   Right handed   Caffeine: occasional   Family History  Problem Relation Age of Onset  . CAD Father    ROS: All systems reviewed and negative except as per HPI.   Current Outpatient Medications  Medication Sig Dispense Refill  . acetaminophen (TYLENOL) 325 MG tablet Take 650 mg by mouth every 6 (six) hours as needed for mild pain or fever.    Marland Kitchen. aspirin 81 MG EC tablet Take 1 tablet (81 mg total) by mouth daily. 15 tablet 0  . atorvastatin (LIPITOR) 20 MG tablet Take 1 tablet (20 mg total) by mouth daily. 30 tablet 4  . gabapentin (NEURONTIN) 300 MG capsule Take 1 capsule (300 mg total) by mouth 3 (three) times daily. 90 capsule 3  . oxyCODONE (OXY IR/ROXICODONE) 5 MG immediate release tablet Take 5 mg by mouth every 4 (four) hours as needed for severe pain.    . potassium chloride SA (K-DUR,KLOR-CON) 20 MEQ tablet Take 2 tablets (40 mEq total) by mouth 2 (two) times daily. 120 tablet 3  . thiamine 100 MG tablet Take 1 tablet (100 mg total) by mouth daily. 30 tablet 0  . torsemide (DEMADEX)  20 MG tablet Take 2 tablets (40 mg total) by mouth 2 (two) times daily. 120 tablet 2   No current facility-administered medications for this encounter.    BP 108/76   Pulse (!) 103   Wt 174 lb (78.9 kg)   SpO2 92%   BMI 24.27 kg/m   Wt Readings from Last 3 Encounters:  11/18/17 174 lb (78.9 kg)  11/07/17 168 lb 12.8 oz (76.6 kg)  11/06/17 132 lb (59.9 kg)    General:  Appears chorincally ill. Arrived in a wheel chair.  HEENT: normal Neck: supple. JVP to jaw . Carotids 2+ bilat; no bruits. No lymphadenopathy or thryomegaly appreciated. Cor: PMI nondisplaced. Regular rate &  rhythm. No rubs, gallops . 3/6 HSM or murmurs. Lungs: clear Abdomen: soft, nontender, nondistended. No hepatosplenomegaly. No bruits or masses. Good bowel sounds. Extremities: no cyanosis, clubbing, rash, R and LLE 2-3+ edema Neuro: alert & orientedx3, cranial nerves grossly intact. moves all 4 extremities w/o difficulty. Affect pleasant  EKG: NSR 100 bpm   Assessment/Plan: 1. Acute Chronic diastolic CHF: This is driven by severe MR.   - Volume status elevated in the setting of gatorade and lean quisine meals. Volume will be difficult to control until he has MV surgery.  -- Wear compression stockings.  -Check BMET 2. C difficile colitis: Recurrent. Completed C diff treatment.  3. Prolonged QT interval: Likely related to amiodarone.  Increased QTc in setting of amiodarone does not predict worse outcome.  4. Mitral regurgitation: Perforated MV due to endocarditis with severe MR.  This potentiates CHF. Patient needs MV repair and has follow up with Dr Cornelius Moras July 1st.  Tentative surgery  scheduled for 12/09/17.  5. Right pleural effusion: Chronic, thoracentesis scheduled by TCTS.  6. Atrial flutter: Noted in 5/19, converted to NSR on amiodarone.  - Continue po amiodarone.  - Check CMET and TSH now. .  - Not anticoagulated due to short duration of atrial flutter and need for surgery.  7. CAD: Nonobstructive.  -No  s/s ischemia.  -Patient is on a statin.   Follow up in 1 week to reassess volume status. May need to give IV lasix.  Instructed to Do the following things EVERYDAY: 1) Weigh yourself in the morning before breakfast. Write it down and keep it in a log. 2) Take your medicines as prescribed 3) Eat low salt foods-Limit salt (sodium) to 2000 mg per day.  4) Stay as active as you can everyday 5) Limit all fluids for the day to less than 2 liters  Greater than 50% of the (total minutes 25) visit spent in counseling/coordination of care regarding the above.    Amy Clegg NP-C  11/18/2017

## 2017-11-18 NOTE — Patient Instructions (Signed)
Routine lab work today. Will notify you of abnormal results, otherwise no news is good news!  INCREASE Torsemide to 40 mg TWICE daily.  Follow up next week.  ________________________________________________________________________ [place] Ridge[facility] Code:[id number]  Take all medication as prescribed the day of your appointment. Bring all medications with you to your appointment.  Do the following things EVERYDAY: 1) Weigh yourself in the morning before breakfast. Write it down and keep it in a log. 2) Take your medicines as prescribed 3) Eat low salt foods-Limit salt (sodium) to 2000 mg per day.  4) Stay as active as you can everyday 5) Limit all fluids for the day to less than 2 liters

## 2017-11-19 ENCOUNTER — Encounter (INDEPENDENT_AMBULATORY_CARE_PROVIDER_SITE_OTHER): Payer: Self-pay | Admitting: Physician Assistant

## 2017-11-19 ENCOUNTER — Ambulatory Visit (INDEPENDENT_AMBULATORY_CARE_PROVIDER_SITE_OTHER): Payer: Medicaid Other | Admitting: Physician Assistant

## 2017-11-19 ENCOUNTER — Other Ambulatory Visit: Payer: Self-pay

## 2017-11-19 VITALS — BP 120/82 | HR 96 | Temp 97.5°F | Ht 71.0 in | Wt 179.4 lb

## 2017-11-19 DIAGNOSIS — I5032 Chronic diastolic (congestive) heart failure: Secondary | ICD-10-CM | POA: Diagnosis not present

## 2017-11-19 DIAGNOSIS — Z1211 Encounter for screening for malignant neoplasm of colon: Secondary | ICD-10-CM

## 2017-11-19 DIAGNOSIS — F1124 Opioid dependence with opioid-induced mood disorder: Secondary | ICD-10-CM

## 2017-11-19 LAB — T3, FREE: T3 FREE: 2 pg/mL (ref 2.0–4.4)

## 2017-11-19 NOTE — Patient Instructions (Addendum)
Community Resources  Advocacy/Legal Legal Aid [place]:  1-866-219-5262  /  336-272-0148  Family Justice Center:  336-641-7233  [facility] 24-hr Crisis line:  336-273-7273  Women's Resource Center, [place]:  336-275-6090  Court Watch (custody):  336-275-2346  [facility] [facility]:   336-279-9299    Baby & Breastfeeding Car Seat Inspection @ Various [place] Fire Depts.- call 336-373-2177  [facility]  336-832-6860  [facility] 336-878-6712  WIC: 336-641-3663 ([place]);  336-641-7571 ([place])  La Leche League:  1-877-452-5321   Childcare [place] [facility]: 336-369-5097 ([place]) / 336-887-8224 ([place])  - Child Care Resources/ Referrals/ Scholarships  - Head Start/ Early Head Start (call or apply online)  [place] DHHS: [place] Pre-K :  1-800-859-0829 / 336-274-5437   Employment / Job Search [facility] [place]: 336-275-6090 / [address]  [place] [employer] (JobLink): 336-373-5922 ([place]) / 336-882-4141 ([place])  [facility] Resource/ Career Center: 336-275-9801 / 336-282-7307  [facility] Job & Career Center: 336-373-3764  DHHS Work First: 336-641-3447 ([place]) / 336-641-3447 ([place])  [facility] [place]:  336-676-5871   Financial Assistance [facility]:  336-553-2657  [facility]: 336-235-0368  [employer] (furniture):  336-370-4002  [place] [facility]: 336-373-4264  Low Income Energy Assistance  336-641-3000   Food Assistance DHHS- SNAP/ Food Stamps: 336-641-4588  WIC: [place]- 336-641-3663 ;  [place] 336-641-7571  Little Green Book- Free Meals  Little Blue Book- Free Food Pantries  During the summer, text "FOOD" to 877877   General Health / Clinics (Adults) Orange Card (for Adults) through [facility]: (336) 895-4900  [facility]:   336-832-8035  [facility]:   336-832-4444  Health Department:  336-641-3245  [facility]:  336-415-3877 / 336-641-2100  [facility] [place]:   336-373-0678  [facility]:   336-334-4822 x 50251   Housing [place] [facility] Coalition:   336-691-9521  [place] Housing Authority:  336-275-8501  Affordable Housing Managemnt:  336-273-0568   Immigrant/ Refugee Center for [place]ians (UNCG):  336-256-1065  Fa[facility]:  336-379-0037  [employer] [facility]:  336-937-4701  Church World Services:  336-617-0381  African Services Coalition:  336-574-2677   LGBTQ YouthSAFE  www.youthsafe[place].org  PFLAG  336-541-6754 / info@[place].org  The Trevor Project:  1-866-488-7386   Mental Health/ Substance Use [facility]  336-387-6161  [facility]:  336-832-9700 or 1-800-711-2635  [facility] of Care:  336-271-5888  [facility]:  336-294-1349  [facility]:  336-542-2884  [facility] (walk-ins)  336-676-6840 / [address]  [place]:  800-449-1287  Alcoholics Anonymous:  336-854-4278  Narcotics Anonymous:  800-365-1036  Quit Smoking Hotline:  800-QUIT-NOW (800-784-8669)   Parenting Children's Home Society:  800-632-1400  [place]: Education Center & Support Groups:  336-832-6682  YWCA: 336-273-3461  [employer]CG: Bringing Out the Best:  336-334-3120               Thriving at Three (Hispanic families): 336-256-1066  Healthy Start ([facility]):  336-387-6161 x2288  Parents as Teachers:  336-691-0024  [place] [facility]- Learning Together (Immigrants): 336-369-5001   Poison Control 800-222-1222  Sports & Recreation [employer] Open Doors Application: ymcanwnc.org/join/open-doors-financial-assistance/  City of [place] Recreation Centers: http://www.[place].gov/index.aspx?page=3615   Special Needs Family Support Network:  336-832-6507  Autism Society of :   336-333-0197 x1402 or x1412 /  800-785-1035  [facility] [place]:  336-334-5773     ARC of [place]:  (343)759-94086034771736  Children's Developmental Service Agency (CDSA):  (365)489-6846807-750-8967  [facility] (Care Coordination for Children):  636-787-6278248-668-1117   Transportation Medicaid Transportation: 437-131-4148(438) 562-5200 to apply  [place] Authority: 231-880-6723815-323-2185 (reduced-fare bus ID to Medicaid/ Medicare/ Orange Card)  SCAT Paratransit services: Eligible riders only, call ([phone] for application   Tutoring/Mentoring Black Child Development Institute: 865-421-1825  [facility] Big Sisters: (469) 205-1788(604)872-5594 (407)186-6318([place])  740-263-5110 ([place])  ACES through child's school: (351)620-3868  [employer] Achievers: contact your local Y  SHIELD Mentor Program: (939)272-0042(254)801-4858     Heart Failure Heart failure means your heart has trouble pumping blood. This makes it hard for your body to work well. Heart failure is usually a long-term (chronic) condition. You must take good care of yourself and follow your doctor's treatment plan. Follow these instructions at home:  Take your heart medicine as told by your doctor. ? Do not stop taking medicine unless your doctor tells you to. ? Do not skip any dose of medicine. ? Refill your medicines before they run out. ? Take other medicines only as told by your doctor or pharmacist.  Stay active if told by your doctor. The elderly and people with severe heart failure should talk with a doctor about physical activity.  Eat heart-healthy foods. Choose foods that are without trans fat and are low in saturated fat, cholesterol, and salt (sodium). This includes fresh or frozen fruits and vegetables, fish, lean meats, fat-free or low-fat dairy foods, whole grains, and high-fiber foods. Lentils and dried peas and beans (legumes) are also good choices.  Limit salt if told by your doctor.  Cook in a healthy way. Roast, grill, broil, bake, poach, steam, or stir-fry foods.  Limit fluids as told by your doctor.  Weigh yourself every morning. Do this after you pee (urinate) and before  you eat breakfast. Write down your weight to give to your doctor.  Take your blood pressure and write it down if your doctor tells you to.  Ask your doctor how to check your pulse. Check your pulse as told.  Lose weight if told by your doctor.  Stop smoking or chewing tobacco. Do not use gum or patches that help you quit without your doctor's approval.  Schedule and go to doctor visits as told.  Nonpregnant women should have no more than 1 drink a day. Men should have no more than 2 drinks a day. Talk to your doctor about drinking alcohol.  Stop illegal drug use.  Stay current with shots (immunizations).  Manage your health conditions as told by your doctor.  Learn to manage your stress.  Rest when you are tired.  If it is really hot outside: ? Avoid intense activities. ? Use air conditioning or fans, or get in a cooler place. ? Avoid caffeine and alcohol. ? Wear loose-fitting, lightweight, and light-colored clothing.  If it is really cold outside: ? Avoid intense activities. ? Layer your clothing. ? Wear mittens or gloves, a hat, and a scarf when going outside. ? Avoid alcohol.  Learn about heart failure and get support as needed.  Get help to maintain or improve your quality of life and your ability to care for yourself as needed. Contact a doctor if:  You gain weight quickly.  You are more short of breath than usual.  You cannot do your normal activities.  You tire easily.  You cough more than normal, especially with activity.  You have any or more puffiness (swelling) in areas such as your hands,  feet, ankles, or belly (abdomen).  You cannot sleep because it is hard to breathe.  You feel like your heart is beating fast (palpitations).  You get dizzy or light-headed when you stand up. Get help right away if:  You have trouble breathing.  There is a change in mental status, such as becoming less alert or not being able to focus.  You have chest pain  or discomfort.  You faint. This information is not intended to replace advice given to you by your health care provider. Make sure you discuss any questions you have with your health care provider. Document Released: 02/27/2008 Document Revised: 10/26/2015 Document Reviewed: 07/06/2012 Elsevier Interactive Patient Education  2017 [employer].

## 2017-11-19 NOTE — Progress Notes (Signed)
Subjective:  Patient ID: Marcus [name], male    DOB: 06/16/1965  Age: 52 y.o. MRN: [id number]  CC: CHF  HPI Marcus [name] is a 52 y.o. male with a medical history of bacteremia, endocarditis of mitral valve, severe MR, HCV, CKD3, malnutrition, pleural effusion, polysubstance abuse, septic embolism, septic arthritis of knee, and thrombocytopenia presents as a new patient for general management of health. Pressing issue involves CHF and severe MR for which he was hospitalized on 09/30/17 and discharged on 10/22/17. Has an appointment for minimally invasive mitral valve repair on 12/09/17. Last cardiology visit yesterday and advised to eat low salt foods, wear compression stockings, stay active, limit fluids, and weigh himself. Says he does not know how much sodium he is allowed to consume per day but knows he should stay away from gatorade and high sodium foods.     Main concern today is of pain relief for his back. Reports distant history of three fractured vertebrae. Hs taken opioid pain killers for a couple of years now. Patient says he has not had oxycodone filled as pharmacist refused to fill. Has not fallen into withdrawals because he has been self medicating with left over oxycodone he has at home. Pt refuses to go to pain clinic because if he goes back to work, they will treat him as a felon when they learn of his attendance at pain clinic. Patient amenable to go to Methadone clinic.     Outpatient Medications Prior to Visit  Medication Sig Dispense Refill  . aspirin 81 MG EC tablet Take 1 tablet (81 mg total) by mouth daily. 15 tablet 0  . atorvastatin (LIPITOR) 20 MG tablet Take 1 tablet (20 mg total) by mouth daily. 30 tablet 4  . potassium chloride SA (K-DUR,KLOR-CON) 20 MEQ tablet Take 2 tablets (40 mEq total) by mouth 2 (two) times daily. 120 tablet 3  . thiamine 100 MG tablet Take 1 tablet (100 mg total) by mouth daily. 30 tablet 0  . torsemide (DEMADEX) 20 MG tablet Take 2 tablets (40 mg  total) by mouth 2 (two) times daily. 120 tablet 11  . acetaminophen (TYLENOL) 325 MG tablet Take 650 mg by mouth every 6 (six) hours as needed for mild pain or fever.    . gabapentin (NEURONTIN) 300 MG capsule Take 1 capsule (300 mg total) by mouth 3 (three) times daily. (Patient not taking: Reported on 11/19/2017) 90 capsule 3  . oxyCODONE (OXY IR/ROXICODONE) 5 MG immediate release tablet Take 5 mg by mouth every 4 (four) hours as needed for severe pain.     No facility-administered medications prior to visit.      ROS Review of Systems  Constitutional: Negative for chills, fever and malaise/fatigue.  Eyes: Negative for blurred vision.  Respiratory: Positive for shortness of breath (with exertion).   Cardiovascular: Negative for chest pain and palpitations.  Gastrointestinal: Negative for abdominal pain and nausea.  Genitourinary: Negative for dysuria and hematuria.  Musculoskeletal: Negative for joint pain and myalgias.  Skin: Negative for rash.  Neurological: Negative for tingling and headaches.  Psychiatric/Behavioral: Negative for depression. The patient is not nervous/anxious.     Objective:  Wt 179 lb 6.4 oz (81.4 kg)   BMI 25.02 kg/m   Vitals:   11/19/17 1338  BP: 120/82  Pulse: 96  Temp: (!) 97.5 F (36.4 C)  TempSrc: Oral  SpO2: 91%  Weight: 179 lb 6.4 oz (81.4 kg)  Height: 5\' 11"  (1.803 m)  Physical Exam  Constitutional: He is oriented to person, place, and time.  Sitting in wheelchair, edematous LE bilaterally, NAD  HENT:  Head: Normocephalic and atraumatic.  Eyes: No scleral icterus.  Neck: Normal range of motion. Neck supple. No thyromegaly present.  Cardiovascular: Normal rate, regular rhythm and normal heart sounds. Exam reveals no gallop and no friction rub.  No murmur heard. 3+ pitting edema of LE bilaterally  Pulmonary/Chest: Effort normal and breath sounds normal. No respiratory distress.  Musculoskeletal: He exhibits no edema.  Neurological:  He is alert and oriented to person, place, and time.  Skin: Skin is warm and dry. No rash noted. No erythema. No pallor.  Psychiatric: He has a normal mood and affect. His behavior is normal. Thought content normal.  Vitals reviewed.    Assessment & Plan:    1. Opioid dependence with opioid-induced mood disorder (HCC) - I had staff message Ms. Jenel Lucks LCSW to obtain information about the area's methadone clinics. I have also printed out Narcotics Anonymous phone number for patient to call. Advised pt that he will need to eventually have alternative pain control and ortho evaluation.  2. Chronic diastolic congestive heart failure (HCC) - Compression stockings.  - Continue on advise given to patient by cardiologist yesterday. I have reemphasized points made by cardiologist to include salt restriction, fluid restriction, wearing compression stockings, take medications as directed, weighing himself, etc. - Still too early to see appreciable difference in volume reduction with torsemide as less than one day has passed since his instruction to take Torsemide 20 mg BID.   3. Special screening for malignant neoplasms, colon - Gave patient FIT test and instructions on how to collect and turn in.    Follow-up: Return in about 1 month (around 12/17/2017).   Loletta Specter PA

## 2017-11-24 NOTE — Progress Notes (Signed)
Advanced Heart Failure Clinic Note  PCP: Sindy Messingoger [facility] Cardiology: Dr. Shirlee LatchMcLean TCTS: Dr. Dion Bodywen  Marcus [name] is a 52 y.o. 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   Admitted 1/6-1/21/19 with sepsis. Found to have MSSA bacteremia with endocarditis, embolic strokes, left knee septic arthritis, and AKI. He required a washout of left knee. Echo showed normal EF with vegetation on mitral valve with mild MR. He was discharged with 6 weeks of IV ampicillin per ID.   Admitted 3/9-3/27/19 with hypoxic hypercarbic respiratory failure. Treated for HCAP. Required RLL thoracentesis. 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 Dr Cornelius Moraswen recommended MV replacement after 6 weeks of antibiotics for MSSA bacterial endocarditis.R/LHC showed mild pulmonary HTN and nonobstructive CAD. He had dental extractions. 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 ID recommended Ancef. Pt was also treated for C diff. He was treated for acute diastolic HF with IV diuretics.   He was readmitted prior to surgery on 09/30/17 with acute on chronic diastolic CHF.  He was diuresed. 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  He was started on Flagyl and vancomycin, he remains on vancomycin taper.   Today he returns for HF follow up. Last visit his torsemide was increased to 40 mg BID. He had previously been taking 40 mg daily (ordered for 40 mg BID). Overall feeling  badly. He has only been taking torsemide every other day because he thinks it gives him diarrhea. Weights have gone down 6 lbs, but he is still SOB with ADLs. Edema has improved a little. Denies orthopnea or PND. Appetite and energy level okay. No fever or sweats. Has chronic chills. He has stopped eating salty foods and is limiting his fluid intake. No more Gatorades or lean cuisines. He has HH PT, OT, and RN through [facility]. Weights down 6 lbs at home.    Labs 11/07/2017: K 3.1 Creatinine 1.6  Labs (5/19): K 3.7, creatinine 1.59  PMH: 1. Chronic diastolic CHF: Due to severe mitral regurgitation from perforated anterior leaflet.  - Echo 10/2017 EF 55-60% with mild/mod RV dysfunction.  - RHC 10/01/17 mildly elevated PCWP and pulmonary venous hypertension. Relatively preserved CO.  2. Mitral regurgitation:Severe.  Due to prior endocarditis with MSSA. By Echo 09/29/17 and TEE 08/14/17, he has a large anterior leaflet perforation and probably a chordal rupture.  3. Aflutter with RVR: Noted during 09/30/17 admission.  - Converted to NSR on amiodarone gtt, and maintained with transition to po. - Anticoagulation was not started due to short duration of flutter, no recurrence, and plan for surgery.  4. 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 5. H/o septic arthritis: Left knee.This significantly limits his mobility.   6. Right pleural effusion 7. Polysubstance abuse: - Prior use of opioids.  8. CKD: Stage 3. 9. Diarrhea/C. 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  10. CAD: LHC (3/19) with 70% small D1.   Review of systems  complete and found to be negative unless listed in HPI.   Social History   Socioeconomic History  . Marital status: Single    Spouse name: Not on file  . Number of children: 4  . Years of education: Not on file  . Highest education level: High school graduate  Occupational History  . Not on file  Social  Needs  . Financial resource strain: Not on file  . Food insecurity:    Worry: Not on file    Inability: Not on file  . Transportation needs:    Medical: Not on file    Non-medical: Not on file  Tobacco Use  . Smoking status: Former Smoker    Packs/day: 1.00    Years: 10.00    Pack years: 10.00    Types: Cigarettes    Last attempt to quit: 11/04/2015    Years since quitting: 2.0  . Smokeless tobacco: Never Used  Substance and Sexual Activity  . Alcohol use: Yes    Comment: occasionally   . Drug use: No  . Sexual activity: Yes  Lifestyle  . Physical activity:    Days per week: Not on file    Minutes per session: Not on file  . Stress: Not on file  Relationships  . Social connections:    Talks on phone: Not on file    Gets together: Not on file    Attends religious service: Not on file    Active member of club or organization: Not on file    Attends meetings of clubs or organizations: Not on file    Relationship status: Not on file  . Intimate partner violence:    Fear of current or ex partner: Not on file    Emotionally abused: Not on file    Physically abused: Not on file    Forced sexual activity: Not on file  Other Topics Concern  . Not on file  Social History Narrative   He is currently living with his mother.   Right handed   Caffeine: occasional   Family History  Problem Relation Age of Onset  . CAD Father    Current Outpatient Medications  Medication Sig Dispense Refill  . acetaminophen (TYLENOL) 325 MG tablet Take 650 mg by mouth every 6 (six) hours as needed for mild pain or fever.    Marland Kitchen aspirin 81 MG EC tablet Take 1 tablet (81 mg total) by mouth daily. 15 tablet 0  . atorvastatin (LIPITOR) 20 MG tablet Take 1 tablet (20 mg total) by mouth daily. 30 tablet 4  . oxyCODONE (OXY IR/ROXICODONE) 5 MG immediate release tablet Take 5 mg by mouth every 4 (four) hours as needed for severe pain.    . potassium chloride SA (K-DUR,KLOR-CON) 20 MEQ tablet Take 2  tablets (40 mEq total) by mouth 2 (two) times daily. 120 tablet 3  . thiamine 100 MG tablet Take 1 tablet (100 mg total) by mouth daily. 30 tablet 0  . torsemide (DEMADEX) 20 MG tablet Take 4 tablets (80 mg total) by mouth daily. 120 tablet 11   No current facility-administered medications for this encounter.    BP 120/86   Pulse 92   Wt 169 lb 3.2 oz (76.7 kg)   SpO2 90%   BMI 23.60 kg/m   Wt Readings from Last 3 Encounters:  11/25/17 169 lb 3.2 oz (76.7 kg)  11/19/17 179 lb 6.4 oz (81.4 kg)  11/18/17 174 lb (78.9 kg)    General: Appears chronically  ill. HEENT: Normal Neck: Supple. JVP to jaw. Carotids 2+ bilat; no bruits. No thyromegaly or nodule noted. Cor: PMI nondisplaced. RRR, 3/6 HSM  Lungs: clear, diminished throughout Abdomen: Soft, non-tender, non-distended, no HSM. No bruits or masses. +BS  Extremities: No cyanosis, clubbing, or rash. R and LLE 2-3+ edema up thighs Neuro: Alert & orientedx3, cranial nerves grossly intact. moves all 4 extremities w/o difficulty. Affect pleasant  Assessment/Plan: 1. Acute Chronic diastolic CHF: This is driven by severe MR.   - NYHA class IIIb - Volume status overloaded in setting of medication noncompliance. Volume will be difficult to control until he has MV surgery.  - Change torsemide from 40 mg BID to 80 mg daily. Discussed importance of taking every day to remove fluid. Offered IV lasix today, but he declined and says he will take his torsemide as ordered.  - Wear compression stockings.  2. C difficile colitis: Recurrent. Completed C diff treatment. Continues to have some diarrhea. Encouraged him to try activia yogurt.  3. Prolonged QT interval: Likely related to amiodarone.  Increased QTc in setting of amiodarone does not predict worse outcome. No change.  4. Mitral regurgitation: Perforated MV due to endocarditis with severe MR.  This potentiates CHF. Patient needs MV repair and has follow up with Dr Cornelius Moras July 1st.  - Tentative  surgery scheduled for 12/09/17. No change.  5. Right pleural effusion:  - Had right thoracentesis 2 weeks ago with 1 L off (per patient) 6. Atrial flutter: Noted in 5/19, converted to NSR on amiodarone.  - AST, ALT normal 11/2017, TSH elevated, but T4 and T3 normal 11/2017. - Not anticoagulated due to short duration of atrial flutter and need for surgery.  - Regular on exam today 7. CAD: Nonobstructive.  - No s/s ischemia.  - Continue ASA, statin.   BMET, BNP today Change torsemide to 80 mg daily. Follow up early next week. May need IV lasix or admission if not improved.   Alford Highland, NP 11/25/2017  Greater than 50% of the 25 minute visit was spent in counseling/coordination of care regarding disease state education, salt/fluid restriction, sliding scale diuretics, and medication compliance.

## 2017-11-25 ENCOUNTER — Encounter (HOSPITAL_COMMUNITY): Payer: Self-pay

## 2017-11-25 ENCOUNTER — Ambulatory Visit (HOSPITAL_COMMUNITY)
Admission: RE | Admit: 2017-11-25 | Discharge: 2017-11-25 | Disposition: A | Discharge status: Home / Self Care | Payer: Medicaid Other | Source: Ambulatory Visit | Attending: Internal Medicine | Admitting: Internal Medicine

## 2017-11-25 VITALS — BP 120/86 | HR 92 | Wt 169.2 lb

## 2017-11-25 DIAGNOSIS — I38 Endocarditis, valve unspecified: Secondary | ICD-10-CM | POA: Diagnosis not present

## 2017-11-25 DIAGNOSIS — I2510 Atherosclerotic heart disease of native coronary artery without angina pectoris: Secondary | ICD-10-CM

## 2017-11-25 DIAGNOSIS — J90 Pleural effusion, not elsewhere classified: Secondary | ICD-10-CM | POA: Insufficient documentation

## 2017-11-25 DIAGNOSIS — Z79899 Other long term (current) drug therapy: Secondary | ICD-10-CM | POA: Diagnosis not present

## 2017-11-25 DIAGNOSIS — I5032 Chronic diastolic (congestive) heart failure: Secondary | ICD-10-CM | POA: Diagnosis not present

## 2017-11-25 DIAGNOSIS — N183 Chronic kidney disease, stage 3 (moderate): Secondary | ICD-10-CM | POA: Insufficient documentation

## 2017-11-25 DIAGNOSIS — I130 Hypertensive heart and chronic kidney disease with heart failure and stage 1 through stage 4 chronic kidney disease, or unspecified chronic kidney disease: Secondary | ICD-10-CM | POA: Diagnosis not present

## 2017-11-25 DIAGNOSIS — M009 Pyogenic arthritis, unspecified: Secondary | ICD-10-CM | POA: Insufficient documentation

## 2017-11-25 DIAGNOSIS — I5033 Acute on chronic diastolic (congestive) heart failure: Secondary | ICD-10-CM | POA: Insufficient documentation

## 2017-11-25 DIAGNOSIS — Z87891 Personal history of nicotine dependence: Secondary | ICD-10-CM | POA: Insufficient documentation

## 2017-11-25 DIAGNOSIS — B1920 Unspecified viral hepatitis C without hepatic coma: Secondary | ICD-10-CM | POA: Diagnosis not present

## 2017-11-25 DIAGNOSIS — Z9114 Patient's other noncompliance with medication regimen: Secondary | ICD-10-CM | POA: Insufficient documentation

## 2017-11-25 DIAGNOSIS — I4581 Long QT syndrome: Secondary | ICD-10-CM | POA: Diagnosis not present

## 2017-11-25 DIAGNOSIS — A0472 Enterocolitis due to Clostridium difficile, not specified as recurrent: Secondary | ICD-10-CM | POA: Diagnosis not present

## 2017-11-25 DIAGNOSIS — Z7982 Long term (current) use of aspirin: Secondary | ICD-10-CM | POA: Diagnosis not present

## 2017-11-25 DIAGNOSIS — Z8249 Family history of ischemic heart disease and other diseases of the circulatory system: Secondary | ICD-10-CM | POA: Insufficient documentation

## 2017-11-25 DIAGNOSIS — I4892 Unspecified atrial flutter: Secondary | ICD-10-CM | POA: Diagnosis not present

## 2017-11-25 DIAGNOSIS — I340 Nonrheumatic mitral (valve) insufficiency: Secondary | ICD-10-CM | POA: Diagnosis not present

## 2017-11-25 LAB — BASIC METABOLIC PANEL
ANION GAP: 9 (ref 5–15)
BUN: 22 mg/dL — AB (ref 6–20)
CALCIUM: 9 mg/dL (ref 8.9–10.3)
CO2: 28 mmol/L (ref 22–32)
Chloride: 99 mmol/L (ref 98–111)
Creatinine, Ser: 1.3 mg/dL — ABNORMAL HIGH (ref 0.61–1.24)
GFR calc Af Amer: >60 mL/min (ref >60)
GFR calc non Af Amer: >60 mL/min (ref >60)
GLUCOSE: 99 mg/dL (ref 70–99)
Potassium: 3.9 mmol/L (ref 3.5–5.1)
Sodium: 136 mmol/L (ref 135–145)

## 2017-11-25 LAB — BRAIN NATRIURETIC PEPTIDE: B Natriuretic Peptide: 910.1 pg/mL — ABNORMAL HIGH (ref 0.0–100.0)

## 2017-11-25 MED ORDER — TORSEMIDE 20 MG PO TABS: 80.0000 mg | ORAL_TABLET | Freq: Every day | ORAL | 11 refills | Status: AC

## 2017-11-25 NOTE — Patient Instructions (Signed)
Labs today (will call for abnormal results, otherwise no news is good news)  START taking Torsemide 80 mg (4 Tablets) every morning.   Follow up Monday July 1st @10 :00 AM

## 2017-11-28 ENCOUNTER — Other Ambulatory Visit: Payer: Self-pay | Admitting: Thoracic Surgery (Cardiothoracic Vascular Surgery)

## 2017-11-28 DIAGNOSIS — Z952 Presence of prosthetic heart valve: Secondary | ICD-10-CM

## 2017-12-01 ENCOUNTER — Encounter (HOSPITAL_COMMUNITY): Payer: Self-pay | Admitting: *Deleted

## 2017-12-01 ENCOUNTER — Inpatient Hospital Stay (HOSPITAL_COMMUNITY): Payer: Medicaid Other

## 2017-12-01 ENCOUNTER — Other Ambulatory Visit: Payer: Self-pay

## 2017-12-01 ENCOUNTER — Inpatient Hospital Stay (HOSPITAL_COMMUNITY)
Admission: EM | Admit: 2017-12-01 | Discharge: 2018-01-01 | DRG: 870 | Disposition: E | Payer: Medicaid Other | Attending: Pulmonary Disease | Admitting: Pulmonary Disease

## 2017-12-01 ENCOUNTER — Emergency Department (HOSPITAL_COMMUNITY): Payer: Medicaid Other

## 2017-12-01 ENCOUNTER — Inpatient Hospital Stay (HOSPITAL_COMMUNITY)
Admission: RE | Admit: 2017-12-01 | Discharge: 2017-12-01 | Disposition: A | Discharge status: Home / Self Care | Payer: Self-pay | Source: Ambulatory Visit

## 2017-12-01 ENCOUNTER — Ambulatory Visit: Payer: Medicaid Other | Admitting: Thoracic Surgery (Cardiothoracic Vascular Surgery)

## 2017-12-01 DIAGNOSIS — I2510 Atherosclerotic heart disease of native coronary artery without angina pectoris: Secondary | ICD-10-CM | POA: Diagnosis present

## 2017-12-01 DIAGNOSIS — D649 Anemia, unspecified: Secondary | ICD-10-CM | POA: Diagnosis not present

## 2017-12-01 DIAGNOSIS — Z9119 Patient's noncompliance with other medical treatment and regimen: Secondary | ICD-10-CM

## 2017-12-01 DIAGNOSIS — F1120 Opioid dependence, uncomplicated: Secondary | ICD-10-CM | POA: Diagnosis present

## 2017-12-01 DIAGNOSIS — B9562 Methicillin resistant Staphylococcus aureus infection as the cause of diseases classified elsewhere: Secondary | ICD-10-CM | POA: Diagnosis present

## 2017-12-01 DIAGNOSIS — E875 Hyperkalemia: Secondary | ICD-10-CM | POA: Diagnosis not present

## 2017-12-01 DIAGNOSIS — K7200 Acute and subacute hepatic failure without coma: Secondary | ICD-10-CM | POA: Diagnosis present

## 2017-12-01 DIAGNOSIS — I248 Other forms of acute ischemic heart disease: Secondary | ICD-10-CM | POA: Diagnosis present

## 2017-12-01 DIAGNOSIS — I509 Heart failure, unspecified: Secondary | ICD-10-CM

## 2017-12-01 DIAGNOSIS — Z8249 Family history of ischemic heart disease and other diseases of the circulatory system: Secondary | ICD-10-CM | POA: Diagnosis not present

## 2017-12-01 DIAGNOSIS — E872 Acidosis: Secondary | ICD-10-CM | POA: Diagnosis present

## 2017-12-01 DIAGNOSIS — Z66 Do not resuscitate: Secondary | ICD-10-CM | POA: Diagnosis not present

## 2017-12-01 DIAGNOSIS — A4102 Sepsis due to Methicillin resistant Staphylococcus aureus: Principal | ICD-10-CM | POA: Diagnosis present

## 2017-12-01 DIAGNOSIS — B1920 Unspecified viral hepatitis C without hepatic coma: Secondary | ICD-10-CM | POA: Diagnosis present

## 2017-12-01 DIAGNOSIS — G8929 Other chronic pain: Secondary | ICD-10-CM | POA: Diagnosis present

## 2017-12-01 DIAGNOSIS — R64 Cachexia: Secondary | ICD-10-CM | POA: Diagnosis not present

## 2017-12-01 DIAGNOSIS — I059 Rheumatic mitral valve disease, unspecified: Secondary | ICD-10-CM | POA: Diagnosis present

## 2017-12-01 DIAGNOSIS — I330 Acute and subacute infective endocarditis: Secondary | ICD-10-CM | POA: Diagnosis present

## 2017-12-01 DIAGNOSIS — I5033 Acute on chronic diastolic (congestive) heart failure: Secondary | ICD-10-CM | POA: Diagnosis present

## 2017-12-01 DIAGNOSIS — J9601 Acute respiratory failure with hypoxia: Secondary | ICD-10-CM | POA: Diagnosis present

## 2017-12-01 DIAGNOSIS — Z79899 Other long term (current) drug therapy: Secondary | ICD-10-CM

## 2017-12-01 DIAGNOSIS — M545 Low back pain: Secondary | ICD-10-CM | POA: Diagnosis present

## 2017-12-01 DIAGNOSIS — E43 Unspecified severe protein-calorie malnutrition: Secondary | ICD-10-CM | POA: Diagnosis not present

## 2017-12-01 DIAGNOSIS — Y92230 Patient room in hospital as the place of occurrence of the external cause: Secondary | ICD-10-CM | POA: Diagnosis not present

## 2017-12-01 DIAGNOSIS — F419 Anxiety disorder, unspecified: Secondary | ICD-10-CM | POA: Diagnosis not present

## 2017-12-01 DIAGNOSIS — Z9289 Personal history of other medical treatment: Secondary | ICD-10-CM

## 2017-12-01 DIAGNOSIS — R6521 Severe sepsis with septic shock: Secondary | ICD-10-CM | POA: Diagnosis present

## 2017-12-01 DIAGNOSIS — I361 Nonrheumatic tricuspid (valve) insufficiency: Secondary | ICD-10-CM | POA: Diagnosis not present

## 2017-12-01 DIAGNOSIS — N183 Chronic kidney disease, stage 3 (moderate): Secondary | ICD-10-CM | POA: Diagnosis present

## 2017-12-01 DIAGNOSIS — Z9911 Dependence on respirator [ventilator] status: Secondary | ICD-10-CM

## 2017-12-01 DIAGNOSIS — Z4659 Encounter for fitting and adjustment of other gastrointestinal appliance and device: Secondary | ICD-10-CM

## 2017-12-01 DIAGNOSIS — I340 Nonrheumatic mitral (valve) insufficiency: Secondary | ICD-10-CM | POA: Diagnosis not present

## 2017-12-01 DIAGNOSIS — L02612 Cutaneous abscess of left foot: Secondary | ICD-10-CM | POA: Diagnosis not present

## 2017-12-01 DIAGNOSIS — J90 Pleural effusion, not elsewhere classified: Secondary | ICD-10-CM

## 2017-12-01 DIAGNOSIS — Z87891 Personal history of nicotine dependence: Secondary | ICD-10-CM | POA: Diagnosis not present

## 2017-12-01 DIAGNOSIS — R7881 Bacteremia: Secondary | ICD-10-CM | POA: Diagnosis present

## 2017-12-01 DIAGNOSIS — K08409 Partial loss of teeth, unspecified cause, unspecified class: Secondary | ICD-10-CM | POA: Diagnosis present

## 2017-12-01 DIAGNOSIS — Z8619 Personal history of other infectious and parasitic diseases: Secondary | ICD-10-CM

## 2017-12-01 DIAGNOSIS — J9690 Respiratory failure, unspecified, unspecified whether with hypoxia or hypercapnia: Secondary | ICD-10-CM

## 2017-12-01 DIAGNOSIS — R21 Rash and other nonspecific skin eruption: Secondary | ICD-10-CM | POA: Diagnosis present

## 2017-12-01 DIAGNOSIS — E871 Hypo-osmolality and hyponatremia: Secondary | ICD-10-CM | POA: Diagnosis present

## 2017-12-01 DIAGNOSIS — A419 Sepsis, unspecified organism: Secondary | ICD-10-CM

## 2017-12-01 DIAGNOSIS — N170 Acute kidney failure with tubular necrosis: Secondary | ICD-10-CM | POA: Diagnosis present

## 2017-12-01 DIAGNOSIS — E162 Hypoglycemia, unspecified: Secondary | ICD-10-CM | POA: Diagnosis not present

## 2017-12-01 DIAGNOSIS — I471 Supraventricular tachycardia: Secondary | ICD-10-CM | POA: Diagnosis not present

## 2017-12-01 DIAGNOSIS — Z6823 Body mass index (BMI) 23.0-23.9, adult: Secondary | ICD-10-CM | POA: Diagnosis not present

## 2017-12-01 DIAGNOSIS — R197 Diarrhea, unspecified: Secondary | ICD-10-CM | POA: Diagnosis not present

## 2017-12-01 DIAGNOSIS — Z8673 Personal history of transient ischemic attack (TIA), and cerebral infarction without residual deficits: Secondary | ICD-10-CM

## 2017-12-01 DIAGNOSIS — T38895A Adverse effect of other hormones and synthetic substitutes, initial encounter: Secondary | ICD-10-CM | POA: Diagnosis not present

## 2017-12-01 DIAGNOSIS — Z515 Encounter for palliative care: Secondary | ICD-10-CM | POA: Diagnosis not present

## 2017-12-01 DIAGNOSIS — F1410 Cocaine abuse, uncomplicated: Secondary | ICD-10-CM | POA: Diagnosis present

## 2017-12-01 DIAGNOSIS — D6959 Other secondary thrombocytopenia: Secondary | ICD-10-CM | POA: Diagnosis present

## 2017-12-01 DIAGNOSIS — Z978 Presence of other specified devices: Secondary | ICD-10-CM

## 2017-12-01 DIAGNOSIS — Z8679 Personal history of other diseases of the circulatory system: Secondary | ICD-10-CM

## 2017-12-01 DIAGNOSIS — Z452 Encounter for adjustment and management of vascular access device: Secondary | ICD-10-CM

## 2017-12-01 DIAGNOSIS — N179 Acute kidney failure, unspecified: Secondary | ICD-10-CM

## 2017-12-01 DIAGNOSIS — R001 Bradycardia, unspecified: Secondary | ICD-10-CM | POA: Diagnosis not present

## 2017-12-01 DIAGNOSIS — Z8701 Personal history of pneumonia (recurrent): Secondary | ICD-10-CM

## 2017-12-01 HISTORY — DX: Heart failure, unspecified: I50.9

## 2017-12-01 LAB — BLOOD GAS, ARTERIAL
ACID-BASE DEFICIT: 4.3 mmol/L — AB (ref 0.0–2.0)
BICARBONATE: 18.6 mmol/L — AB (ref 20.0–28.0)
Drawn by: 535271
O2 Content: 6 L/min
O2 Saturation: 98.8 %
PCO2 ART: 27.1 mmHg — AB (ref 32.0–48.0)
PH ART: 7.458 — AB (ref 7.350–7.450)
PO2 ART: 128 mmHg — AB (ref 83.0–108.0)
Patient temperature: 101.3

## 2017-12-01 LAB — URINALYSIS, ROUTINE W REFLEX MICROSCOPIC
Bilirubin Urine: NEGATIVE
Glucose, UA: NEGATIVE mg/dL
Ketones, ur: NEGATIVE mg/dL
Nitrite: NEGATIVE
Protein, ur: 100 mg/dL — AB
SPECIFIC GRAVITY, URINE: 1.019 (ref 1.005–1.030)
pH: 5 (ref 5.0–8.0)

## 2017-12-01 LAB — COMPREHENSIVE METABOLIC PANEL
ALBUMIN: 2.7 g/dL — AB (ref 3.5–5.0)
ALK PHOS: 102 U/L (ref 38–126)
ALT: 157 U/L — ABNORMAL HIGH (ref 0–44)
ANION GAP: 15 (ref 5–15)
AST: 113 U/L — ABNORMAL HIGH (ref 15–41)
BUN: 37 mg/dL — ABNORMAL HIGH (ref 6–20)
CALCIUM: 8.6 mg/dL — AB (ref 8.9–10.3)
CHLORIDE: 93 mmol/L — AB (ref 98–111)
CO2: 21 mmol/L — AB (ref 22–32)
Creatinine, Ser: 2.31 mg/dL — ABNORMAL HIGH (ref 0.61–1.24)
GFR calc non Af Amer: 31 mL/min — ABNORMAL LOW (ref >60)
GFR, EST AFRICAN AMERICAN: 36 mL/min — AB (ref >60)
GLUCOSE: 116 mg/dL — AB (ref 70–99)
POTASSIUM: 5.5 mmol/L — AB (ref 3.5–5.1)
SODIUM: 129 mmol/L — AB (ref 135–145)
Total Bilirubin: 2.8 mg/dL — ABNORMAL HIGH (ref 0.3–1.2)
Total Protein: 6.9 g/dL (ref 6.5–8.1)

## 2017-12-01 LAB — RAPID URINE DRUG SCREEN, HOSP PERFORMED
AMPHETAMINES: NOT DETECTED
BENZODIAZEPINES: NOT DETECTED
Cocaine: POSITIVE — AB
OPIATES: POSITIVE — AB
Tetrahydrocannabinol: NOT DETECTED

## 2017-12-01 LAB — BLOOD CULTURE ID PANEL (REFLEXED)
ACINETOBACTER BAUMANNII: NOT DETECTED
CANDIDA GLABRATA: NOT DETECTED
CANDIDA KRUSEI: NOT DETECTED
Candida albicans: NOT DETECTED
Candida parapsilosis: NOT DETECTED
Candida tropicalis: NOT DETECTED
ENTEROBACTER CLOACAE COMPLEX: NOT DETECTED
ENTEROBACTERIACEAE SPECIES: NOT DETECTED
ENTEROCOCCUS SPECIES: NOT DETECTED
ESCHERICHIA COLI: NOT DETECTED
Haemophilus influenzae: NOT DETECTED
Klebsiella oxytoca: NOT DETECTED
Klebsiella pneumoniae: NOT DETECTED
LISTERIA MONOCYTOGENES: NOT DETECTED
Methicillin resistance: DETECTED — AB
NEISSERIA MENINGITIDIS: NOT DETECTED
PROTEUS SPECIES: NOT DETECTED
PSEUDOMONAS AERUGINOSA: NOT DETECTED
STAPHYLOCOCCUS SPECIES: DETECTED — AB
STREPTOCOCCUS AGALACTIAE: NOT DETECTED
STREPTOCOCCUS PNEUMONIAE: NOT DETECTED
STREPTOCOCCUS SPECIES: NOT DETECTED
Serratia marcescens: NOT DETECTED
Staphylococcus aureus (BCID): DETECTED — AB
Streptococcus pyogenes: NOT DETECTED

## 2017-12-01 LAB — CBC WITH DIFFERENTIAL/PLATELET
Basophils Absolute: 0.1 10*3/uL (ref 0.0–0.1)
Basophils Relative: 1 %
EOS ABS: 0.2 10*3/uL (ref 0.0–0.7)
EOS PCT: 4 %
HCT: 42.2 % (ref 39.0–52.0)
HEMOGLOBIN: 12.6 g/dL — AB (ref 13.0–17.0)
LYMPHS ABS: 0.4 10*3/uL — AB (ref 0.7–4.0)
LYMPHS PCT: 8 %
MCH: 23.4 pg — AB (ref 26.0–34.0)
MCHC: 29.9 g/dL — ABNORMAL LOW (ref 30.0–36.0)
MCV: 78.4 fL (ref 78.0–100.0)
Monocytes Absolute: 0.2 10*3/uL (ref 0.1–1.0)
Monocytes Relative: 4 %
NEUTROS ABS: 4.5 10*3/uL (ref 1.7–7.7)
NEUTROS PCT: 83 %
PLATELETS: 112 10*3/uL — AB (ref 150–400)
RBC: 5.38 MIL/uL (ref 4.22–5.81)
RDW: 22.6 % — AB (ref 11.5–15.5)
WBC: 5.4 10*3/uL (ref 4.0–10.5)

## 2017-12-01 LAB — C DIFFICILE QUICK SCREEN W PCR REFLEX
C Diff antigen: POSITIVE — AB
C Diff toxin: NEGATIVE

## 2017-12-01 LAB — PROTIME-INR
INR: 1.72
Prothrombin Time: 20 seconds — ABNORMAL HIGH (ref 11.4–15.2)

## 2017-12-01 LAB — I-STAT CG4 LACTIC ACID, ED
LACTIC ACID, VENOUS: 6.17 mmol/L — AB (ref 0.5–1.9)
LACTIC ACID, VENOUS: 9.72 mmol/L — AB (ref 0.5–1.9)

## 2017-12-01 LAB — AMMONIA: Ammonia: 28 umol/L (ref 9–35)

## 2017-12-01 LAB — LACTIC ACID, PLASMA
LACTIC ACID, VENOUS: 4.7 mmol/L — AB (ref 0.5–1.9)
Lactic Acid, Venous: 7.4 mmol/L (ref 0.5–1.9)

## 2017-12-01 LAB — BRAIN NATRIURETIC PEPTIDE: B Natriuretic Peptide: 2421.3 pg/mL — ABNORMAL HIGH (ref 0.0–100.0)

## 2017-12-01 LAB — CLOSTRIDIUM DIFFICILE BY PCR, REFLEXED: CDIFFPCR: POSITIVE — AB

## 2017-12-01 LAB — TROPONIN I
TROPONIN I: 1.07 ng/mL — AB (ref <0.03)
Troponin I: 1.74 ng/mL (ref <0.03)

## 2017-12-01 LAB — PROCALCITONIN: Procalcitonin: 23.04 ng/mL

## 2017-12-01 MED ORDER — ACETAMINOPHEN 325 MG PO TABS
650.0000 mg | ORAL_TABLET | Freq: Four times a day (QID) | ORAL | Status: DC | PRN
  Administered 2017-12-01: 650 mg via ORAL
  Filled 2017-12-01: qty 2

## 2017-12-01 MED ORDER — SODIUM CHLORIDE 0.9 % IV SOLN
250.0000 mL | INTRAVENOUS | Status: DC | PRN
  Administered 2017-12-07: 250 mL via INTRAVENOUS

## 2017-12-01 MED ORDER — PIPERACILLIN-TAZOBACTAM 3.375 G IVPB
3.3750 g | Freq: Three times a day (TID) | INTRAVENOUS | Status: DC
  Administered 2017-12-01 – 2017-12-02 (×2): 3.375 g via INTRAVENOUS
  Filled 2017-12-01 (×3): qty 50

## 2017-12-01 MED ORDER — PIPERACILLIN-TAZOBACTAM 3.375 G IVPB 30 MIN
3.3750 g | Freq: Once | INTRAVENOUS | Status: AC
  Administered 2017-12-01: 3.375 g via INTRAVENOUS
  Filled 2017-12-01: qty 50

## 2017-12-01 MED ORDER — NOREPINEPHRINE 4 MG/250ML-% IV SOLN
0.0000 ug/min | INTRAVENOUS | Status: DC
  Filled 2017-12-01: qty 250

## 2017-12-01 MED ORDER — VANCOMYCIN HCL 10 G IV SOLR
1500.0000 mg | Freq: Once | INTRAVENOUS | Status: AC
  Administered 2017-12-01: 1500 mg via INTRAVENOUS
  Filled 2017-12-01: qty 1500

## 2017-12-01 MED ORDER — ORAL CARE MOUTH RINSE
15.0000 mL | Freq: Two times a day (BID) | OROMUCOSAL | Status: DC
  Administered 2017-12-01 – 2017-12-02 (×3): 15 mL via OROMUCOSAL

## 2017-12-01 MED ORDER — HEPARIN SODIUM (PORCINE) 5000 UNIT/ML IJ SOLN
5000.0000 [IU] | Freq: Three times a day (TID) | INTRAMUSCULAR | Status: DC
  Administered 2017-12-01 (×2): 5000 [IU] via SUBCUTANEOUS
  Filled 2017-12-01 (×2): qty 1

## 2017-12-01 MED ORDER — CHLORHEXIDINE GLUCONATE 0.12 % MT SOLN
15.0000 mL | Freq: Two times a day (BID) | OROMUCOSAL | Status: DC
  Administered 2017-12-01 – 2017-12-02 (×3): 15 mL via OROMUCOSAL
  Filled 2017-12-01: qty 15

## 2017-12-01 MED ORDER — SODIUM CHLORIDE 0.9 % IV BOLUS
500.0000 mL | Freq: Once | INTRAVENOUS | Status: AC
  Administered 2017-12-01: 500 mL via INTRAVENOUS

## 2017-12-01 MED ORDER — SODIUM CHLORIDE 0.9 % IV BOLUS
250.0000 mL | Freq: Once | INTRAVENOUS | Status: AC
  Administered 2017-12-01: 250 mL via INTRAVENOUS

## 2017-12-01 MED ORDER — PHENYLEPHRINE HCL-NACL 10-0.9 MG/250ML-% IV SOLN
0.0000 ug/min | INTRAVENOUS | Status: DC
  Administered 2017-12-01: 20 ug/min via INTRAVENOUS
  Administered 2017-12-01: 150 ug/min via INTRAVENOUS
  Filled 2017-12-01 (×4): qty 250

## 2017-12-01 MED ORDER — VANCOMYCIN HCL IN DEXTROSE 1-5 GM/200ML-% IV SOLN: 1000.0000 mg | INTRAVENOUS | Status: DC

## 2017-12-01 MED ORDER — PHENYLEPHRINE HCL-NACL 40-0.9 MG/250ML-% IV SOLN
0.0000 ug/min | INTRAVENOUS | Status: DC
Start: 2017-12-01 — End: 2017-12-02
  Administered 2017-12-01: 200 ug/min via INTRAVENOUS
  Administered 2017-12-01: 270 ug/min via INTRAVENOUS
  Administered 2017-12-02: 375 ug/min via INTRAVENOUS
  Administered 2017-12-02 (×2): 400 ug/min via INTRAVENOUS
  Filled 2017-12-01 (×6): qty 250

## 2017-12-01 MED ORDER — ONDANSETRON HCL 4 MG/2ML IJ SOLN: 4.0000 mg | Freq: Four times a day (QID) | INTRAMUSCULAR | Status: DC | PRN

## 2017-12-01 MED ORDER — ALBUTEROL SULFATE (2.5 MG/3ML) 0.083% IN NEBU: 2.5000 mg | INHALATION_SOLUTION | RESPIRATORY_TRACT | Status: DC | PRN

## 2017-12-01 MED ORDER — NOREPINEPHRINE 4 MG/250ML-% IV SOLN
0.0000 ug/min | Freq: Once | INTRAVENOUS | Status: AC
  Administered 2017-12-01: 5 ug/min via INTRAVENOUS
  Filled 2017-12-01: qty 250

## 2017-12-01 MED ORDER — VANCOMYCIN HCL IN DEXTROSE 1-5 GM/200ML-% IV SOLN: 1000.0000 mg | Freq: Two times a day (BID) | INTRAVENOUS | Status: DC

## 2017-12-01 NOTE — Progress Notes (Signed)
Pharmacy Antibiotic Note  Marcus ReapBryon T [name] is a 52 y.o. male admitted on 12/22/2017 with sepsis.  Pharmacy has been consulted for vancomycin and zosyn dosing. Tmax is 100.9 and WBC is WNL. Scr is well above baseline at 2.31 and lactic acid is significantly elevated at 6.17.   Plan: Vancomycin 1500mg  IV x 1 then 1gm IV Q24H Zosyn 3.375gm IV Q8H (4 hr inf) F/u renal fxn, C&S, clinical status and trough at SS  Height: 5\' 11"  (180.3 cm) Weight: 169 lb (76.7 kg) IBW/kg (Calculated) : 75.3  Temp (24hrs), Avg:100.9 F (38.3 C), Min:100.9 F (38.3 C), Max:100.9 F (38.3 C)  Recent Labs  Lab 11/25/17 1212 12/26/2017 1020 12/12/2017 1047  WBC  --  5.4  --   CREATININE 1.30*  --   --   LATICACIDVEN  --   --  6.17*    Estimated Creatinine Clearance: 70.8 mL/min (A) (by C-G formula based on SCr of 1.3 mg/dL (H)).    No Known Allergies  Antimicrobials this admission: Vanc 7/1>> Zosyn 7/1>>  Dose adjustments this admission: N/A  Microbiology results: Pending  Thank you for allowing pharmacy to be a part of this patient's care.  Marcus [name], Marcus LeachRachel [name] 12/02/2017 11:15 AM
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
## 2017-12-01 NOTE — Progress Notes (Signed)
eLink Physician-Brief Progress Note Patient Name: Marcus ReapBryon T [name] DOB: 06/10/1965 MRN: [id number]   Date of Service  12/14/2017  HPI/Events of Note  Lactic Acid level = 9.72 --> 4.7 --> 7.4. CVP = 21 and Hgb = 12.6.  eICU Interventions  Will order: 1. COOX now.      Intervention Category Major Interventions: Acid-Base disturbance - evaluation and management  Sommer,Steven Eugene 12/17/2017, 11:56 PM

## 2017-12-01 NOTE — Procedures (Signed)
Arterial Catheter Insertion Procedure Note Kern ReapBryon T Tiedt [id number] 09/10/1965  Procedure: Insertion of Arterial Catheter  Indications: Blood pressure monitoring and Frequent blood sampling  Procedure Details Consent: Risks of procedure as well as the alternatives and risks of each were explained to the (patient/caregiver).  Consent for procedure obtained. Time Out: Verified patient identification, verified procedure, site/side was marked, verified correct patient position, special equipment/implants available, medications/allergies/relevent history reviewed, required imaging and test results available.  Performed  Maximum sterile technique was used including antiseptics, cap, gloves, gown, hand hygiene, mask and sheet. Skin prep: Chlorhexidine; local anesthetic administered 20 gauge catheter was inserted into left radial artery using the Seldinger technique. ULTRASOUND GUIDANCE USED: NO Evaluation Blood flow good; BP tracing good. Complications: No apparent complications.   Suszanne ConnersLaura P Taryne Kiger 12/28/2017
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
## 2017-12-01 NOTE — ED Notes (Signed)
Critical care in to assess

## 2017-12-01 NOTE — ED Notes (Signed)
TurkeyVictoria (mother) [phone]; Karen KitchensBobbie [phone]) - contact numbers

## 2017-12-01 NOTE — ED Triage Notes (Addendum)
PT here from home via [facility] for bil LE edema and weakness. Pt was supposed to have cardiology appt today, but was too weak to go.  cbg 102, bp 90/68, hr 102, rr22.

## 2017-12-01 NOTE — ED Notes (Signed)
o2 increased to 10 L/[place] - 02 sats increasing to 92%; remains tachypneic

## 2017-12-01 NOTE — H&P (Signed)
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  He arrived to the emergency room with low-grade fever, mild hypotension and pain all over.  He was found to have a diffuse erythematous skin rash, lactate of 6 with evidence of fluid overload.  PCCM asked to admit with presumptive diagnosis of sepsis, source unclear.  Received Zosyn and vancomycin in the emergency room He admits to last IV drug use in February, notes indicate that he has been using some leftover oxycodone pills but he denies.  Last family medicine office visit indicates  referred to methadone clinic  Past medical history and summary 06/2017 MSSA bacteremia with endocarditis, embolic stroke, left knee septic arthritis requiring washout, vegetation on mitral valve, treated with 6 weeks of IV ampicillin.  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  09/2017.  Admission for acute on chronic diastolic CHF, mild recurrence of [name] that delayed surgery  11/07/2017 last thoracentesis with removal of 600 cc of right pleural fluid  PAST MEDICAL HISTORY :  He  has a past medical history of Acute kidney failure (HCC) (06/2017), Arthritis, Bacteremia due to methicillin resistant Staphylococcus aureus, CHF (congestive heart failure) (HCC), Chronic lower back pain, Endocarditis of mitral valve, Hepatitis C, Hepatitis C antibody positive in blood (06/10/2017), Malnutrition of moderate degree (06/19/2017), Meningitis due to bacteria, Pleural effusion, bilateral, Polysubstance abuse (HCC),  Septic arthritis of knee, left (HCC) (06/2017), Septic embolism (HCC) (06/2017), Severe mitral regurgitation, and Thrombocytopenia (HCC).  PAST SURGICAL HISTORY: He  has a past surgical history that includes Ankle surgery (Left); Hip fracture surgery (Left, 1994); Irrigation and debridement knee (Left, 06/11/2017); Tonsillectomy; Fracture surgery; TEE without cardioversion (N/A, 08/14/2017); RIGHT/LEFT HEART CATH AND CORONARY ANGIOGRAPHY (N/A, 08/19/2017); Radiology with anesthesia (N/A, 08/19/2017); Multiple extractions with alveoloplasty (N/A, 08/21/2017); RIGHT HEART CATH (N/A, 10/01/2017); and IR THORACENTESIS ASP PLEURAL SPACE W/IMG GUIDE (11/07/2017).  No Known Allergies  No current facility-administered medications on file prior to encounter.    Current Outpatient Medications on File Prior to Encounter  Medication Sig  . torsemide (DEMADEX) 20 MG tablet Take 4 tablets (80 mg total) by mouth daily. (Patient taking differently: Take 40 mg by mouth 2 (two) times daily. )  . acetaminophen (TYLENOL) 325 MG tablet Take 650 mg by mouth every 6 (six) hours as needed for mild pain or fever.  Marland Kitchen albuterol (PROVENTIL HFA;VENTOLIN HFA) 108 (90 Base) MCG/ACT inhaler Inhale 2 puffs into the lungs 2 (two) times daily.  Marland Kitchen aspirin 81 MG EC tablet Take 1 tablet (81 mg total) by mouth daily. (Patient not taking: Reported on 11/28/2017)  . atorvastatin (LIPITOR) 20 MG tablet Take 1 tablet (20 mg total) by mouth daily.  Marland Kitchen gabapentin (NEURONTIN) 300 MG capsule Take 300 mg by mouth 3 (three) times daily.  . potassium chloride SA (K-DUR,KLOR-CON) 20 MEQ tablet Take 2 tablets (40 mEq total) by mouth 2 (two) times daily.  Marland Kitchen thiamine 100 MG tablet Take 1 tablet (100 mg total) by mouth daily.    FAMILY HISTORY:  His indicated that the status of  his father is unknown.   SOCIAL HISTORY: He  reports that he quit smoking about 2 years ago. His smoking use included cigarettes. He has a 10.00 pack-year smoking history. He has  never used smokeless tobacco. He reports that he drinks alcohol. He reports that he does not use drugs.  REVIEW OF SYSTEMS:    Positive for fevers, anorexia, skin rash, diffuse body pain  Eyes: negative for irritation, redness and visual disturbance  Ears, nose, mouth, throat, and face: negative for earaches, epistaxis, nasal congestion and sore throat  Respiratory: negative for cough,sputum and wheezing  Cardiovascular: negative for chest pain, dyspnea,  orthopnea, palpitations and syncope  Gastrointestinal: negative for abdominal pain, constipation, diarrhea, melena, nausea and vomiting  Genitourinary:negative for dysuria, frequency and hematuria  Hematologic/lymphatic: negative for bleeding, easy bruising and lymphadenopathy  Musculoskeletal:negative for and stiff joints  Neurological: negative for coordination problems, gait problems, headaches and weakness  Endocrine: negative for diabetic symptoms including polydipsia, polyuria and weight loss   SUBJECTIVE:    VITAL SIGNS: BP 93/80   Pulse (!) 115   Temp (!) 100.9 F (38.3 C) (Rectal)   Resp (!) 26   Ht 5\' 11"  (1.803 m)   Wt 169 lb (76.7 kg)   SpO2 93%   BMI 23.57 kg/m   HEMODYNAMICS:    VENTILATOR SETTINGS:    INTAKE / OUTPUT: No intake/output data recorded.  PHYSICAL EXAMINATION: Gen. poorlyl-nourished, in mild distress, anxious affect ENT - no pallor,muddy sclera Neck: No JVD, no thyromegaly, no carotid bruits Lungs: no use of accessory muscles, no dullness to percussion, decreased BL without rales or rhonchi  Cardiovascular: Rhythm tachy, pansystolic murmur at apex, 2+ peripheral edema Abdomen: soft and non-tender, no hepatosplenomegaly, BS normal. Musculoskeletal: No deformities, no cyanosis or clubbing, no joint swelling Neuro:  alert, non focal Skin -diffuse  papular rash erythematous, no mucosal involvement   LABS:  BMET Recent Labs  Lab 11/25/17 1212 06-Dec-2017 1020  NA 136 129*  K 3.9 5.5*   CL 99 93*  CO2 28 21*  BUN 22* 37*  CREATININE 1.30* 2.31*  GLUCOSE 99 116*    Electrolytes Recent Labs  Lab 11/25/17 1212 2017/12/06 1020  CALCIUM 9.0 8.6*    CBC Recent Labs  Lab 12-06-17 1020  WBC 5.4  HGB 12.6*  HCT 42.2  PLT 112*    Coag's Recent Labs  Lab 2017/12/06 1020  INR 1.72    Sepsis Markers Recent Labs  Lab 12-06-17 1047  LATICACIDVEN 6.17*    ABG No results for input(s): PHART, PCO2ART, PO2ART in the last 168 hours.  Liver Enzymes Recent Labs  Lab December 06, 2017 1020  AST 113*  ALT 157*  ALKPHOS 102  BILITOT 2.8*  ALBUMIN 2.7*    Cardiac Enzymes No results for input(s): TROPONINI, PROBNP in the last 168 hours.  Glucose No results for input(s): GLUCAP in the last 168 hours.  Imaging Dg Chest Portable 1 View  Result Date: 12-06-2017 CLINICAL DATA:  Lower extremity edema and weakness, history CHF EXAM: PORTABLE CHEST 1 VIEW COMPARISON:  Portable exam 1025 hours compared to 11/07/2017 FINDINGS: Enlargement of cardiac silhouette with pulmonary vascular congestion. Bibasilar pleural effusions and atelectasis slightly increased versus prior exam. Question mild pulmonary edema. No pneumothorax. Bones demineralized. IMPRESSION: Probable mild CHF with increased bibasilar effusions and atelectasis. Electronically Signed   By: Ulyses Southward M.D.   On: 2017/12/06 10:37     STUDIES:    CULTURES: Blood 7/1 >>  ANTIBIOTICS: Zosyn 7/1 >> vanc  7/1 >>  SIGNIFICANT EVENTS:   LINES/TUBES:   DISCUSSION: 52 year old man with substance abuse: [Heroin] and known mitral valve endocarditis, admitted with sepsis unclear source with diffuse rash.  He denies active IV drug use, last in February. Differential diagnosis includes acute endocarditis, no other source apparent.  There is worsening acute on chronic diastolic heart failure Bilateral pleural effusions appear chronic  ASSESSMENT / PLAN:  PULMONARY A: Acute hypoxic respiratory failure -Related  to fluid overload P:   Oxygen as needed. If worsening respiratory distress, consider noninvasive ventilation  CARDIOVASCULAR A:  Acute on chronic diastolic heart failure. Severe MR. We will rule out acute infective endocarditis P:  Hold off fluids for sepsis, use pressors as needed (levophed)  RENAL A:   AKI  Mild hyperkalemia P:   Hold diuretics for now Can resume once blood pressure improves. Monitor electrolytes   GASTROINTESTINAL A:   Elevated LFTs?  Congestive hepatopathy Hepatitis C P:   Follow intermittently  HEMATOLOGIC A:   Mild thrombocytopenia P:  Subcu heparin and SCDs for DVT prophylaxis Follow platelets  INFECTIOUS A:   Sepsis, source unclear, rule out endocarditis P:   Empiric Zosyn and vancomycin while awaiting cultures  ENDOCRINE A:   No issues, mildly elevated TSH noted prior P:     NEUROLOGIC A:   Opiate use disorder P:    Watch for withdrawal   FAMILY  - Updates: Mother updated via phone  - Inter-disciplinary family meet or Palliative Care meeting due by:  NA   The patient is critically ill with multiple organ systems failure and requires high complexity decision making for assessment and support, frequent evaluation and titration of therapies, application of advanced monitoring technologies and extensive interpretation of multiple databases. Critical Care Time devoted to patient care services described in this note independent of APP/resident  time is 60 minutes.   Cyril Mourningakesh Dwight Adamczak MD. Tonny BollmanFCCP. [facility] Pager [phone] If no response call [phone]    12/26/2017, 12:47 PM

## 2017-12-01 NOTE — Progress Notes (Signed)
Dr. Vassie LollAlva was notified of pt's inc HR with levophed. Also dec BP.  Orders received.  Also reported pt has had no urine output.

## 2017-12-01 NOTE — Progress Notes (Signed)
CRITICAL VALUE ALERT  Critical Value:  Lactic acid 7.4  Date & Time Notied:  12/28/2017 2352  Provider Notified: Pola CornELink  Orders Received/Actions taken: CVP 21, obtain coox

## 2017-12-01 NOTE — Procedures (Signed)
Central Venous Catheter Insertion Procedure Note Kern ReapBryon T Steinhilber [id number] 12/15/1965  Procedure: Insertion of Central Venous Catheter Indications: Assessment of intravascular volume, Drug and/or fluid administration and Frequent blood sampling  Procedure Details Consent: Risks of procedure as well as the alternatives and risks of each were explained to the (patient/caregiver).  Consent for procedure obtained. Time Out: Verified patient identification, verified procedure, site/side was marked, verified correct patient position, special equipment/implants available, medications/allergies/relevent history reviewed, required imaging and test results available.  Performed  Maximum sterile technique was used including antiseptics, cap, gloves, gown, hand hygiene, mask and sheet. Skin prep: Chlorhexidine; local anesthetic administered A antimicrobial bonded/coated triple lumen catheter was placed in the left internal jugular vein using the Seldinger technique. Ultrasound guidance used.Yes.   Catheter placed to 20 cm. Blood aspirated via all 3 ports and then flushed x 3. Line sutured x 2 and dressing applied.  Evaluation Blood flow good Complications: No apparent complications Patient did tolerate procedure well. Chest X-ray ordered to verify placement.  CXR: pending.  Joneen RoachPaul Ericha Whittingham, AGACNP-BC [facility] Pager [phone] or [phone]  12/17/2017 6:46 PM

## 2017-12-01 NOTE — Progress Notes (Signed)
PHARMACY - PHYSICIAN COMMUNICATION CRITICAL VALUE ALERT - BLOOD CULTURE IDENTIFICATION (BCID)  Kern ReapBryon T Aldous is an 52 y.o. male who [facility] with a chief complaint of weakness  Assessment:  BC with MRSA  Name of physician (or Provider) Contacted: Arsenio LoaderSommer  Current antibiotics: vanc and zosyn  Changes to prescribed antibiotics recommended:  Patient is on recommended antibiotics - No changes needed  Results for orders placed or performed during the hospital encounter of 11-27-17  Blood Culture ID Panel (Reflexed) (Collected: 12/11/2017 10:20 AM)  Result Value Ref Range   Enterococcus species NOT DETECTED NOT DETECTED   Listeria monocytogenes NOT DETECTED NOT DETECTED   Staphylococcus species DETECTED (A) NOT DETECTED   Staphylococcus aureus DETECTED (A) NOT DETECTED   Methicillin resistance DETECTED (A) NOT DETECTED   Streptococcus species NOT DETECTED NOT DETECTED   Streptococcus agalactiae NOT DETECTED NOT DETECTED   Streptococcus pneumoniae NOT DETECTED NOT DETECTED   Streptococcus pyogenes NOT DETECTED NOT DETECTED   Acinetobacter baumannii NOT DETECTED NOT DETECTED   Enterobacteriaceae species NOT DETECTED NOT DETECTED   Enterobacter cloacae complex NOT DETECTED NOT DETECTED   Escherichia coli NOT DETECTED NOT DETECTED   Klebsiella oxytoca NOT DETECTED NOT DETECTED   Klebsiella pneumoniae NOT DETECTED NOT DETECTED   Proteus species NOT DETECTED NOT DETECTED   Serratia marcescens NOT DETECTED NOT DETECTED   Haemophilus influenzae NOT DETECTED NOT DETECTED   Neisseria meningitidis NOT DETECTED NOT DETECTED   Pseudomonas aeruginosa NOT DETECTED NOT DETECTED   Candida albicans NOT DETECTED NOT DETECTED   Candida glabrata NOT DETECTED NOT DETECTED   Candida krusei NOT DETECTED NOT DETECTED   Candida parapsilosis NOT DETECTED NOT DETECTED   Candida tropicalis NOT DETECTED NOT DETECTED    Talbert CageSeay, Wynter Grave Poteet 12/13/2017  11:30 PM

## 2017-12-01 NOTE — ED Notes (Signed)
Report taken from off going RN - care assumed at this time; parens at bedside; pt resting uncomfortably on stretcher - resp even, labored at 40 breaths per min; skin mottled, cool, dry; ccm showing ST rate 134 without ectopy; pt awake, lethargic, will answers questions with minimal difficulty; third spacing noted to bilat upper and lower extremities; abd appears ascitic; generalized rash (red, flat, papular appearing) noted; pt denies urticaria; plan of care explained to pt and family - all verbalized understanding

## 2017-12-01 NOTE — Progress Notes (Signed)
Lactic at 1545 4.7  reported from lab at 1750 - notified Renae FicklePaul NP - also made aware that pt does not have pulses in his RT foot not even with Doppler - pulses in Lt foot doppler feet cool to touch

## 2017-12-01 NOTE — Progress Notes (Signed)
Repeat lactate noted to be rising from 6 to 9. Tachycardia improved, arterial line placed and blood pressure seems to correlate with cuff. Started on levo fed after blood pressure dropped and discussed tachycardia to 140s. We will give normal saline 500 bolus and changed to Neo-Synephrine If more than 200 mics required and will place central line  Family updated at bedside, troponin noted to be positive we will trend. Heart failure service has been consulted  Additional critical care time x 9074m  Teigan Manner V. Vassie LollAlva MD

## 2017-12-01 NOTE — Progress Notes (Signed)
eLink Physician-Brief Progress Note Patient Name: Marcus ReapBryon T Nauert DOB: 06/05/1965 MRN: [id number]   Date of Service  12/05/2017  HPI/Events of Note  Multiple loose stools - Hx of [name] Colitis. Request for [name] PCR.   eICU Interventions  Will order: 1. [name] PCR screen.  2. Enteric precautions.      Intervention Category Major Interventions: Other:  Lenell AntuSommer,Makaylah Oddo Eugene 12/22/2017, 8:57 PM

## 2017-12-02 ENCOUNTER — Other Ambulatory Visit: Payer: Self-pay | Admitting: *Deleted

## 2017-12-02 ENCOUNTER — Inpatient Hospital Stay (HOSPITAL_COMMUNITY): Payer: Medicaid Other

## 2017-12-02 ENCOUNTER — Other Ambulatory Visit (HOSPITAL_COMMUNITY): Payer: Self-pay

## 2017-12-02 DIAGNOSIS — B9562 Methicillin resistant Staphylococcus aureus infection as the cause of diseases classified elsewhere: Secondary | ICD-10-CM

## 2017-12-02 DIAGNOSIS — Z87891 Personal history of nicotine dependence: Secondary | ICD-10-CM

## 2017-12-02 DIAGNOSIS — Z8249 Family history of ischemic heart disease and other diseases of the circulatory system: Secondary | ICD-10-CM

## 2017-12-02 DIAGNOSIS — Z8619 Personal history of other infectious and parasitic diseases: Secondary | ICD-10-CM

## 2017-12-02 DIAGNOSIS — E43 Unspecified severe protein-calorie malnutrition: Secondary | ICD-10-CM

## 2017-12-02 DIAGNOSIS — R7881 Bacteremia: Secondary | ICD-10-CM | POA: Diagnosis present

## 2017-12-02 LAB — BASIC METABOLIC PANEL
ANION GAP: 17 — AB (ref 5–15)
BUN: 47 mg/dL — ABNORMAL HIGH (ref 6–20)
CALCIUM: 7.7 mg/dL — AB (ref 8.9–10.3)
CO2: 13 mmol/L — ABNORMAL LOW (ref 22–32)
Chloride: 99 mmol/L (ref 98–111)
Creatinine, Ser: 2.5 mg/dL — ABNORMAL HIGH (ref 0.61–1.24)
GFR calc Af Amer: 32 mL/min — ABNORMAL LOW (ref >60)
GFR, EST NON AFRICAN AMERICAN: 28 mL/min — AB (ref >60)
Glucose, Bld: 66 mg/dL — ABNORMAL LOW (ref 70–99)
Potassium: 5.6 mmol/L — ABNORMAL HIGH (ref 3.5–5.1)
SODIUM: 129 mmol/L — AB (ref 135–145)

## 2017-12-02 LAB — RENAL FUNCTION PANEL
ANION GAP: 17 — AB (ref 5–15)
Albumin: 2.3 g/dL — ABNORMAL LOW (ref 3.5–5.0)
BUN: 51 mg/dL — ABNORMAL HIGH (ref 6–20)
CO2: 14 mmol/L — AB (ref 22–32)
Calcium: 7.2 mg/dL — ABNORMAL LOW (ref 8.9–10.3)
Chloride: 103 mmol/L (ref 98–111)
Creatinine, Ser: 2.52 mg/dL — ABNORMAL HIGH (ref 0.61–1.24)
GFR calc non Af Amer: 28 mL/min — ABNORMAL LOW (ref >60)
GFR, EST AFRICAN AMERICAN: 32 mL/min — AB (ref >60)
GLUCOSE: 50 mg/dL — AB (ref 70–99)
PHOSPHORUS: 6.3 mg/dL — AB (ref 2.5–4.6)
POTASSIUM: 5.7 mmol/L — AB (ref 3.5–5.1)
SODIUM: 134 mmol/L — AB (ref 135–145)

## 2017-12-02 LAB — CBC
HEMATOCRIT: 38 % — AB (ref 39.0–52.0)
HEMOGLOBIN: 11.5 g/dL — AB (ref 13.0–17.0)
MCH: 23.2 pg — ABNORMAL LOW (ref 26.0–34.0)
MCHC: 30.3 g/dL (ref 30.0–36.0)
MCV: 76.8 fL — ABNORMAL LOW (ref 78.0–100.0)
Platelets: 101 10*3/uL — ABNORMAL LOW (ref 150–400)
RBC: 4.95 MIL/uL (ref 4.22–5.81)
RDW: 22.1 % — ABNORMAL HIGH (ref 11.5–15.5)
WBC: 14.2 10*3/uL — AB (ref 4.0–10.5)

## 2017-12-02 LAB — POCT ACTIVATED CLOTTING TIME
ACTIVATED CLOTTING TIME: 125 s
ACTIVATED CLOTTING TIME: 131 s
ACTIVATED CLOTTING TIME: 153 s
Activated Clotting Time: 158 seconds

## 2017-12-02 LAB — BLOOD GAS, ARTERIAL
ACID-BASE DEFICIT: 13.3 mmol/L — AB (ref 0.0–2.0)
ACID-BASE DEFICIT: 14.2 mmol/L — AB (ref 0.0–2.0)
BICARBONATE: 10.8 mmol/L — AB (ref 20.0–28.0)
BICARBONATE: 11.6 mmol/L — AB (ref 20.0–28.0)
Drawn by: 44135
Drawn by: 365271
FIO2: 21
FIO2: 100
LHR: 22 {breaths}/min
MECHVT: 570 mL
O2 SAT: 95.2 %
O2 SAT: 99.7 %
PCO2 ART: 18.2 mmHg — AB (ref 32.0–48.0)
PEEP/CPAP: 5 cmH2O
PH ART: 7.392 (ref 7.350–7.450)
PH ART: 7.261 — AB (ref 7.350–7.450)
PO2 ART: 82.2 mmHg — AB (ref 83.0–108.0)
Patient temperature: 98.6
Patient temperature: 97.5
pCO2 arterial: 26.5 mmHg — ABNORMAL LOW (ref 32.0–48.0)
pO2, Arterial: 415 mmHg — ABNORMAL HIGH (ref 83.0–108.0)

## 2017-12-02 LAB — POCT I-STAT 3, ART BLOOD GAS (G3+)
ACID-BASE DEFICIT: 11 mmol/L — AB (ref 0.0–2.0)
BICARBONATE: 12 mmol/L — AB (ref 20.0–28.0)
O2 SAT: 97 %
TCO2: 13 mmol/L — ABNORMAL LOW (ref 22–32)
pCO2 arterial: 21 mmHg — ABNORMAL LOW (ref 32.0–48.0)
pH, Arterial: 7.365 (ref 7.350–7.450)
pO2, Arterial: 89 mmHg (ref 83.0–108.0)

## 2017-12-02 LAB — COOXEMETRY PANEL
CARBOXYHEMOGLOBIN: 1.5 % (ref 0.5–1.5)
CARBOXYHEMOGLOBIN: 1.8 % — AB (ref 0.5–1.5)
Methemoglobin: 1 % (ref 0.0–1.5)
Methemoglobin: 1.4 % (ref 0.0–1.5)
O2 SAT: 69.3 %
O2 Saturation: 48.4 %
TOTAL HEMOGLOBIN: 10.7 g/dL — AB (ref 12.0–16.0)
TOTAL HEMOGLOBIN: 7.4 g/dL — AB (ref 12.0–16.0)

## 2017-12-02 LAB — LACTIC ACID, PLASMA
Lactic Acid, Venous: 9.3 mmol/L (ref 0.5–1.9)
Lactic Acid, Venous: 10.1 mmol/L (ref 0.5–1.9)

## 2017-12-02 LAB — GLUCOSE, CAPILLARY
GLUCOSE-CAPILLARY: 96 mg/dL (ref 70–99)
GLUCOSE-CAPILLARY: 84 mg/dL (ref 70–99)
Glucose-Capillary: 46 mg/dL — ABNORMAL LOW (ref 70–99)

## 2017-12-02 LAB — TROPONIN I: TROPONIN I: 2.77 ng/mL — AB (ref <0.03)

## 2017-12-02 LAB — MAGNESIUM: Magnesium: 2.1 mg/dL (ref 1.7–2.4)

## 2017-12-02 LAB — PHOSPHORUS: PHOSPHORUS: 5.2 mg/dL — AB (ref 2.5–4.6)

## 2017-12-02 LAB — HEPARIN LEVEL (UNFRACTIONATED): HEPARIN UNFRACTIONATED: 0.21 [IU]/mL — AB (ref 0.30–0.70)

## 2017-12-02 MED ORDER — SODIUM CHLORIDE 0.9 % IV BOLUS
1000.0000 mL | Freq: Once | INTRAVENOUS | Status: AC
  Administered 2017-12-02: 1000 mL via INTRAVENOUS

## 2017-12-02 MED ORDER — HEPARIN BOLUS VIA INFUSION
1000.0000 [IU] | Freq: Once | INTRAVENOUS | Status: AC
  Administered 2017-12-02: 1000 [IU] via INTRAVENOUS
  Filled 2017-12-02: qty 1000

## 2017-12-02 MED ORDER — CHLORHEXIDINE GLUCONATE 0.12% ORAL RINSE (MEDLINE KIT)
15.0000 mL | Freq: Two times a day (BID) | OROMUCOSAL | Status: DC
  Administered 2017-12-02 – 2017-12-10 (×15): 15 mL via OROMUCOSAL

## 2017-12-02 MED ORDER — HEPARIN SODIUM (PORCINE) 1000 UNIT/ML DIALYSIS
1000.0000 [IU] | INTRAMUSCULAR | Status: DC | PRN
  Filled 2017-12-02: qty 6

## 2017-12-02 MED ORDER — ACETAMINOPHEN 325 MG PO TABS: 650.0000 mg | ORAL_TABLET | Freq: Four times a day (QID) | ORAL | Status: DC | PRN

## 2017-12-02 MED ORDER — FENTANYL CITRATE (PF) 100 MCG/2ML IJ SOLN
INTRAMUSCULAR | Status: AC
  Administered 2017-12-02: 100 ug
  Filled 2017-12-02: qty 2

## 2017-12-02 MED ORDER — HEPARIN SODIUM (PORCINE) 1000 UNIT/ML IJ SOLN
3000.0000 [IU] | Freq: Once | INTRAMUSCULAR | Status: AC
  Administered 2017-12-02: 2400 [IU] via INTRAVENOUS
  Filled 2017-12-02: qty 3

## 2017-12-02 MED ORDER — PRISMASOL BGK 4/2.5 32-4-2.5 MEQ/L IV SOLN
INTRAVENOUS | Status: DC
  Administered 2017-12-02 – 2017-12-10 (×51): via INTRAVENOUS_CENTRAL
  Filled 2017-12-02 (×59): qty 5000

## 2017-12-02 MED ORDER — PHENYLEPHRINE HCL-NACL 40-0.9 MG/250ML-% IV SOLN: 0.0000 ug/min | INTRAVENOUS | Status: DC

## 2017-12-02 MED ORDER — STERILE WATER FOR INJECTION IV SOLN
INTRAVENOUS | Status: DC
  Administered 2017-12-02 – 2017-12-03 (×6): via INTRAVENOUS_CENTRAL
  Filled 2017-12-02 (×16): qty 150

## 2017-12-02 MED ORDER — MIDAZOLAM HCL 2 MG/2ML IJ SOLN
INTRAMUSCULAR | Status: AC
  Administered 2017-12-02: 2 mg
  Filled 2017-12-02: qty 2

## 2017-12-02 MED ORDER — ETOMIDATE 2 MG/ML IV SOLN
20.0000 mg | Freq: Once | INTRAVENOUS | Status: AC
  Administered 2017-12-02: 20 mg via INTRAVENOUS

## 2017-12-02 MED ORDER — ASPIRIN 81 MG PO CHEW
81.0000 mg | CHEWABLE_TABLET | Freq: Every day | ORAL | Status: DC
  Administered 2017-12-03 – 2017-12-06 (×4): 81 mg
  Filled 2017-12-02 (×4): qty 1

## 2017-12-02 MED ORDER — MIDAZOLAM HCL 2 MG/2ML IJ SOLN
INTRAMUSCULAR | Status: AC
  Filled 2017-12-02: qty 2

## 2017-12-02 MED ORDER — FAMOTIDINE 40 MG/5ML PO SUSR
20.0000 mg | Freq: Two times a day (BID) | ORAL | Status: DC
  Administered 2017-12-02 – 2017-12-10 (×16): 20 mg
  Filled 2017-12-02 (×17): qty 2.5

## 2017-12-02 MED ORDER — SODIUM CHLORIDE 0.9 % IJ SOLN
250.0000 [IU]/h | INTRAMUSCULAR | Status: DC
  Administered 2017-12-02: 250 [IU]/h via INTRAVENOUS_CENTRAL
  Administered 2017-12-03 (×2): 1200 [IU]/h via INTRAVENOUS_CENTRAL
  Administered 2017-12-03: 1150 [IU]/h via INTRAVENOUS_CENTRAL
  Administered 2017-12-04: 1750 [IU]/h via INTRAVENOUS_CENTRAL
  Administered 2017-12-04 (×2): 1500 [IU]/h via INTRAVENOUS_CENTRAL
  Administered 2017-12-05: 1750 [IU]/h via INTRAVENOUS_CENTRAL
  Administered 2017-12-05: 1800 [IU]/h via INTRAVENOUS_CENTRAL
  Administered 2017-12-05: 1750 [IU]/h via INTRAVENOUS_CENTRAL
  Administered 2017-12-06 (×5): 1850 [IU]/h via INTRAVENOUS_CENTRAL
  Administered 2017-12-07: 1950 [IU]/h via INTRAVENOUS_CENTRAL
  Administered 2017-12-07 (×3): 1850 [IU]/h via INTRAVENOUS_CENTRAL
  Administered 2017-12-08 (×3): 2100 [IU]/h via INTRAVENOUS_CENTRAL
  Administered 2017-12-09: 1900 [IU]/h via INTRAVENOUS_CENTRAL
  Administered 2017-12-09: 1650 [IU]/h via INTRAVENOUS_CENTRAL
  Administered 2017-12-09: 2250 [IU]/h via INTRAVENOUS_CENTRAL
  Administered 2017-12-09: 1950 [IU]/h via INTRAVENOUS_CENTRAL
  Administered 2017-12-10: 1650 [IU]/h via INTRAVENOUS_CENTRAL
  Administered 2017-12-10: 1900 [IU]/h via INTRAVENOUS_CENTRAL
  Filled 2017-12-02 (×23): qty 2

## 2017-12-02 MED ORDER — CHLORHEXIDINE GLUCONATE CLOTH 2 % EX PADS
6.0000 | MEDICATED_PAD | Freq: Every day | CUTANEOUS | Status: DC
  Administered 2017-12-04 – 2017-12-10 (×6): 6 via TOPICAL

## 2017-12-02 MED ORDER — VANCOMYCIN 50 MG/ML ORAL SOLUTION
500.0000 mg | Freq: Four times a day (QID) | ORAL | Status: DC
  Administered 2017-12-02 – 2017-12-10 (×32): 500 mg
  Filled 2017-12-02 (×33): qty 10

## 2017-12-02 MED ORDER — METRONIDAZOLE IN NACL 5-0.79 MG/ML-% IV SOLN
500.0000 mg | Freq: Three times a day (TID) | INTRAVENOUS | Status: DC
  Administered 2017-12-02: 500 mg via INTRAVENOUS
  Filled 2017-12-02: qty 100

## 2017-12-02 MED ORDER — HEPARIN BOLUS VIA INFUSION (CRRT)
1000.0000 [IU] | INTRAVENOUS | Status: DC | PRN
  Administered 2017-12-02 (×2): 1000 [IU] via INTRAVENOUS_CENTRAL
  Filled 2017-12-02: qty 1000

## 2017-12-02 MED ORDER — MIDAZOLAM HCL 2 MG/2ML IJ SOLN
2.0000 mg | INTRAMUSCULAR | Status: DC | PRN
  Administered 2017-12-02: 2 mg via INTRAVENOUS
  Administered 2017-12-03 (×2): 1 mg via INTRAVENOUS
  Administered 2017-12-04 – 2017-12-06 (×6): 2 mg via INTRAVENOUS
  Filled 2017-12-02 (×9): qty 2

## 2017-12-02 MED ORDER — ASPIRIN 81 MG PO CHEW: 81.0000 mg | CHEWABLE_TABLET | Freq: Every day | ORAL | Status: DC

## 2017-12-02 MED ORDER — SODIUM BICARBONATE 8.4 % IV SOLN
INTRAVENOUS | Status: DC
  Administered 2017-12-02: 04:00:00 via INTRAVENOUS
  Filled 2017-12-02 (×3): qty 850

## 2017-12-02 MED ORDER — DEXTROSE-NACL 5-0.9 % IV SOLN
INTRAVENOUS | Status: DC
  Administered 2017-12-02 – 2017-12-03 (×2): via INTRAVENOUS

## 2017-12-02 MED ORDER — HEPARIN (PORCINE) 2000 UNITS/L FOR CRRT
INTRAVENOUS_CENTRAL | Status: DC | PRN
  Filled 2017-12-02: qty 1000

## 2017-12-02 MED ORDER — STERILE WATER FOR INJECTION IV SOLN
INTRAVENOUS | Status: DC
  Administered 2017-12-02 – 2017-12-03 (×10): via INTRAVENOUS_CENTRAL
  Filled 2017-12-02 (×22): qty 150

## 2017-12-02 MED ORDER — VASOPRESSIN 20 UNIT/ML IV SOLN
0.0300 [IU]/min | INTRAVENOUS | Status: DC
  Administered 2017-12-02 – 2017-12-09 (×10): 0.03 [IU]/min via INTRAVENOUS
  Filled 2017-12-02 (×9): qty 2

## 2017-12-02 MED ORDER — HEPARIN (PORCINE) IN NACL 100-0.45 UNIT/ML-% IJ SOLN
1650.0000 [IU]/h | INTRAMUSCULAR | Status: DC
  Administered 2017-12-02: 1500 [IU]/h via INTRAVENOUS
  Filled 2017-12-02: qty 250

## 2017-12-02 MED ORDER — LIDOCAINE HCL URETHRAL/MUCOSAL 2 % EX GEL
1.0000 "application " | Freq: Once | CUTANEOUS | Status: AC
  Administered 2017-12-03: 1 via URETHRAL
  Filled 2017-12-02: qty 5

## 2017-12-02 MED ORDER — ORAL CARE MOUTH RINSE
15.0000 mL | OROMUCOSAL | Status: DC
  Administered 2017-12-02 – 2017-12-10 (×75): 15 mL via OROMUCOSAL

## 2017-12-02 MED ORDER — VANCOMYCIN HCL 10 G IV SOLR
1250.0000 mg | INTRAVENOUS | Status: DC
  Administered 2017-12-02: 1250 mg via INTRAVENOUS
  Filled 2017-12-02: qty 1250

## 2017-12-02 MED ORDER — DOBUTAMINE IN D5W 4-5 MG/ML-% IV SOLN
2.5000 ug/kg/min | INTRAVENOUS | Status: DC
  Administered 2017-12-02 – 2017-12-08 (×3): 2.5 ug/kg/min via INTRAVENOUS
  Filled 2017-12-02 (×3): qty 250

## 2017-12-02 MED ORDER — DEXTROSE 50 % IV SOLN
INTRAVENOUS | Status: AC
  Administered 2017-12-02: 50 mL
  Filled 2017-12-02: qty 50

## 2017-12-02 MED ORDER — SODIUM CHLORIDE 0.9 % IV SOLN
0.0000 ug/min | INTRAVENOUS | Status: DC
  Administered 2017-12-02 – 2017-12-04 (×13): 400 ug/min via INTRAVENOUS
  Administered 2017-12-04: 380 ug/min via INTRAVENOUS
  Administered 2017-12-04: 370 ug/min via INTRAVENOUS
  Administered 2017-12-04: 400 ug/min via INTRAVENOUS
  Administered 2017-12-04: 390 ug/min via INTRAVENOUS
  Administered 2017-12-05: 360 ug/min via INTRAVENOUS
  Administered 2017-12-05: 350 ug/min via INTRAVENOUS
  Administered 2017-12-05: 360 ug/min via INTRAVENOUS
  Administered 2017-12-05: 350 ug/min via INTRAVENOUS
  Administered 2017-12-05: 360 ug/min via INTRAVENOUS
  Administered 2017-12-06: 320 ug/min via INTRAVENOUS
  Administered 2017-12-06: 390 ug/min via INTRAVENOUS
  Administered 2017-12-06: 400 ug/min via INTRAVENOUS
  Administered 2017-12-06: 360 ug/min via INTRAVENOUS
  Administered 2017-12-06: 340 ug/min via INTRAVENOUS
  Administered 2017-12-07 – 2017-12-10 (×21): 400 ug/min via INTRAVENOUS
  Filled 2017-12-02 (×11): qty 8
  Filled 2017-12-02: qty 6
  Filled 2017-12-02 (×2): qty 8
  Filled 2017-12-02: qty 5
  Filled 2017-12-02 (×6): qty 8
  Filled 2017-12-02: qty 5
  Filled 2017-12-02 (×2): qty 8
  Filled 2017-12-02: qty 5
  Filled 2017-12-02 (×6): qty 8
  Filled 2017-12-02: qty 5
  Filled 2017-12-02 (×7): qty 8
  Filled 2017-12-02: qty 5
  Filled 2017-12-02 (×3): qty 8

## 2017-12-02 MED ORDER — FENTANYL CITRATE (PF) 100 MCG/2ML IJ SOLN
100.0000 ug | INTRAMUSCULAR | Status: DC | PRN
  Administered 2017-12-02 – 2017-12-06 (×17): 100 ug via INTRAVENOUS
  Filled 2017-12-02 (×17): qty 2

## 2017-12-02 MED ORDER — VANCOMYCIN 50 MG/ML ORAL SOLUTION
500.0000 mg | Freq: Four times a day (QID) | ORAL | Status: DC
  Administered 2017-12-02: 500 mg via ORAL
  Filled 2017-12-02 (×3): qty 10

## 2017-12-02 MED ORDER — HEPARIN SODIUM (PORCINE) 5000 UNIT/ML IJ SOLN: 5000.0000 [IU] | Freq: Three times a day (TID) | INTRAMUSCULAR | Status: DC

## 2017-12-02 NOTE — Procedures (Signed)
Intubation Procedure Note Kern ReapBryon T Leer [id number] 05/08/1966  Procedure: Intubation Indications: Respiratory insufficiency  Procedure Details Consent: Risks of procedure as well as the alternatives and risks of each were explained to the (patient/caregiver).  Consent for procedure obtained. Time Out: Verified patient identification, verified procedure, site/side was marked, verified correct patient position, special equipment/implants available, medications/allergies/relevent history reviewed, required imaging and test results available.  Performed  Maximum sterile technique was used including gloves, gown, hand hygiene and mask.  Glidoscope used to visualize the airway and passage of ETT through the vocal cords.  Bilateral breath sounds auscultated, condensation noted inside ETT, and color change noted.    Evaluation Hemodynamic Status: Transient hypotension treated with pressors and fluid; O2 sats: stable throughout and currently acceptable Patient's Current Condition: stable Complications: No apparent complications Patient did tolerate procedure well. Chest X-ray ordered to verify placement.  CXR: pending.  Intubation was performed under direct supervision of Dr. Darden AmberSood   Henriette Hesser D Keyon Winnick 12/02/2017

## 2017-12-02 NOTE — Progress Notes (Signed)
Notified ELink MD Arsenio LoaderSommer about second AGB result. New orders given.

## 2017-12-02 NOTE — Progress Notes (Signed)
CRITICAL VALUE ALERT  Critical Value:  Troponin 2.77, coox results  Date & Time Notied:  12/02/17  Provider Notified: Pola CornELink  Orders Received/Actions taken: none

## 2017-12-02 NOTE — Progress Notes (Signed)
Notified Dr. Darrick Pennaeterding w/ renal about elevated filter pressures after initiation of CRRT. Advised to initiate heparin via CRRT. Orders placed.

## 2017-12-02 NOTE — Progress Notes (Signed)
Had detailed discussion with pt's parents.  Pt has two sons, but apparently they are not involved with his care decisions.    His parents understand what he has been dealing with, and what caused him to get here.  They understand that given his current situation, he is not a candidate for valve surgery.  They are struggling to accept what he is looking at and not ready to give up hope yet.  They are okay with trial of intubation and dialysis to determine if he can stabilize from his infections.  Coralyn HellingVineet Nakia Remmers, MD [facility] 12/02/2017, 2:17 PM

## 2017-12-02 NOTE — Progress Notes (Signed)
eLink Physician-Brief Progress Note Patient Name: Marcus ReapBryon T [name] DOB: 02/12/1966 MRN: [id number]   Date of Service  12/02/2017  HPI/Events of Note  ABG on room air = 7.36/21.1/89.0/12. He is compensating for his metabolic acidosis, however, he is blowing his pCO2 down to 21 to do so.   eICU Interventions  Will order: 1. NaHCO3 IV infusion to run IV at 50 mL/hour.      Intervention Category Major Interventions: Acid-Base disturbance - evaluation and management  Alfie Rideaux Eugene 12/02/2017, 3:56 AM

## 2017-12-02 NOTE — Progress Notes (Signed)
CRITICAL VALUE ALERT  Critical Value:  Lactic 10.1  Date & Time Notied:  7/2 09:38  Provider Notified: Clayton LefortV. Sood, MD notified   Orders Received/Actions taken: Awaiting new orders. Will continue to monitor pt.

## 2017-12-02 NOTE — Consult Note (Addendum)
TCTS BRIEF CONSULT NOTE   Patient is well-known to me from multiple previous hospitalizations and office visits.  It has been nearly 4 weeks since I last saw him in the office, and unfortunately he decided to scrap plans for continued drug abstinence and potentially life-saving mitral valve surgery.  We just had a straightforward discussion at the bedside.  He understands that he is on the way to becoming another statistic in the long list of patients whose lives have been lost to complications of opiod dependence.  He is no longer a candidate for mitral valve repair.  I recommend addressing code status and referral for consultation with the palliative care team.  Marcus Nailslarence H Laurier Jasperson, MD 12/02/2017 1:06 PM

## 2017-12-02 NOTE — Progress Notes (Signed)
Hypoglycemic Event  CBG: 46  Treatment: 1 amp D50  Symptoms: UTA, vented/sedated  Follow-up CBG: Time:2112 CBG Result: 96  Possible Reasons for Event: NPO status  Comments/MD notified: Raynald KempELink    Lorrin Bodner H Annabeth Tortora

## 2017-12-02 NOTE — Progress Notes (Signed)
eLink Physician-Brief Progress Note Patient Name: Kern ReapBryon T Keo DOB: 09/01/1965 MRN: [id number]   Date of Service  12/02/2017  HPI/Events of Note  Repeat COOX on Dobutamine 2.5 mcg/kg/min = 69.3%  eICU Interventions  Continue present management.      Intervention Category Major Interventions: Acid-Base disturbance - evaluation and management  Ariannah Arenson Eugene 12/02/2017, 6:06 AM

## 2017-12-02 NOTE — Progress Notes (Signed)
PULMONARY / CRITICAL CARE MEDICINE   Name: Marcus [name] MRN: [id number] DOB: December 13, 1965    ADMISSION DATE:  12/17/2017  REFERRING MD:  ED  CHIEF COMPLAINT: Weakness  HISTORY OF PRESENT ILLNESS:   52 yo male former smoker presented with fever, weakness, hypotension.  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  He was tx for C diff in March and April of 2019.  UDS positive for opiates, cocaine.  PAST MEDICAL HISTORY :  Hepatitis C, Meningitis, Thrombocytopenia, Neuropathy  SUBJECTIVE:  Feels weak.  Remains on pressors.  Reports he used cocaine and IV heroin sometime within past month.  VITAL SIGNS: BP (!) 86/71   Pulse 81   Temp 97.8 F (36.6 C) (Axillary)   Resp 15   Ht 5\' 9"  (1.753 m)   Wt 179 lb 0.2 oz (81.2 kg)   SpO2 94%   BMI 26.44 kg/m   HEMODYNAMICS: CVP:  [14 mmHg-18 mmHg] 18 mmHg  INTAKE / OUTPUT: I/O last 3 completed shifts: In: 3709.8 [I.V.:2847.5; IV Piggyback:862.4] Out: -   PHYSICAL EXAMINATION:  General - ill appearing Eyes - pupils reactive ENT - poor dentition Cardiac - regular, 3/6 systolic murmur Chest - decreased BS, no wheeze Abd - mild distention, tinkling bowel sounds Ext - 1+ edema, ischemic toes Rt > Lt Skin - multiple skin lesions and papular rash Neuro - follows commands  LABS:  BMET Recent Labs  Lab 11/25/17 1212 12/07/2017 1020 12/02/17 0335  NA 136 129* 129*  K 3.9 5.5* 5.6*  CL 99 93* 99  CO2 28 21* 13*  BUN 22* 37* 47*  CREATININE 1.30* 2.31* 2.50*  GLUCOSE 99 116* 66*    Electrolytes Recent Labs  Lab 11/25/17 1212 12/12/2017 1020 12/02/17 0335  CALCIUM 9.0 8.6* 7.7*  MG  --   --  2.1  PHOS  --   --  5.2*    CBC Recent Labs  Lab 12/31/2017 1020 12/02/17 0335  WBC 5.4 14.2*  HGB 12.6* 11.5*  HCT 42.2 38.0*  PLT 112* 101*    Coag's Recent Labs  Lab 12/30/2017 1020  INR 1.72    Sepsis Markers Recent Labs  Lab 12/02/2017 1020  12/30/2017 1545 12/31/2017 2306  12/02/17 0634  LATICACIDVEN  --    < > 4.7* 7.4* 9.3*  PROCALCITON 23.04  --   --   --   --    < > = values in this interval not displayed.    ABG Recent Labs  Lab 12/13/2017 1613 12/02/17 0344 12/02/17 0610  PHART 7.458* 7.365 7.392  PCO2ART 27.1* 21.0* 18.2*  PO2ART 128* 89.0 82.2*    Liver Enzymes Recent Labs  Lab 12/25/2017 1020  AST 113*  ALT 157*  ALKPHOS 102  BILITOT 2.8*  ALBUMIN 2.7*    Cardiac Enzymes Recent Labs  Lab 12/22/2017 1020 12/14/2017 1545 12/15/2017 2306  TROPONINI 1.07* 1.74* 2.77*    Glucose No results for input(s): GLUCAP in the last 168 hours.  Imaging Dg Chest Port 1 View  Result Date: 12/02/2017 CLINICAL DATA:  Follow-up pleural effusions EXAM: PORTABLE CHEST 1 VIEW COMPARISON:  12/14/2017 FINDINGS: Left internal jugular central line is unchanged with its tip in the SVC above the right atrium. Enlarged cardiac silhouette again demonstrated. Bilateral pleural effusions persist, similar to yesterday, with atelectasis in both lower lobes. Upper lungs remain well aerated. No qualitatively new finding. IMPRESSION: Persistent bilateral effusions and lower lobe atelectasis, not significantly changed  since yesterday allowing technical differences. Electronically Signed   By: Paulina FusiMark  Shogry M.D.   On: 12/02/2017 06:53   Dg Chest Portable 1 View  Result Date: 12/27/2017 CLINICAL DATA:  Evaluate for central line placement. EXAM: PORTABLE CHEST 1 VIEW COMPARISON:  Portable film at 1025 hours. FINDINGS: Unchanged cardiomediastinal silhouette and aeration. LEFT IJ catheter has been inserted, and lies with its tip at the cavoatrial junction. There is no pneumothorax. IMPRESSION: LEFT IJ catheter tip at cavoatrial junction. No pneumothorax. Stable aeration. Electronically Signed   By: Elsie StainJohn T Curnes M.D.   On: 12/18/2017 19:22   Dg Chest Portable 1 View  Result Date: 12/27/2017 CLINICAL DATA:  Lower extremity edema and weakness, history CHF EXAM: PORTABLE CHEST 1 VIEW  COMPARISON:  Portable exam 1025 hours compared to 11/07/2017 FINDINGS: Enlargement of cardiac silhouette with pulmonary vascular congestion. Bibasilar pleural effusions and atelectasis slightly increased versus prior exam. Question mild pulmonary edema. No pneumothorax. Bones demineralized. IMPRESSION: Probable mild CHF with increased bibasilar effusions and atelectasis. Electronically Signed   By: Ulyses SouthwardMark  Boles M.D.   On: 12/12/2017 10:37     STUDIES:    CULTURES: Blood 7/01 >> MRSA C diff PCR 7/01 >> Ag positive, toxin negative, PCR positive  ANTIBIOTICS: Zosyn 7/01 >> 7/02 IV Vancomycin 7/01 >> PO Vancomycin 7/02 >> Flagyl 7/02 >>  SIGNIFICANT EVENTS: 7/01 Admit, CHF team consulted 7/02 Renal, ID consulted  LINES/TUBES: Lt IJ CVL 7/01 >>   DISCUSSION: 52 yo male with septic shock, AKI, hyperkalemia, acidosis with MRSA in blood and C diff recurrence.  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  Reports using cocaine and IV heroin within past one month.  ASSESSMENT / PLAN:  Septic shock with MRSA in blood, and C diff Ag/PCR positive with hx of recurrent C diff. - pressors to keep MAP > 65; add vasopressin 7/02 - continue IV vancomycin - add po vancomycin and IV flagyl - ID consulted  Acute on chronic diastolic CHF. Mitral valve disease. Elevated troponin from demand ischemia. SVT from levophed. - continue pressors, dobutamine - f/u coox - continue heparin gtt - cardiology consulted  Hyponatremia, hyperkalemia. AKI with ATN. CKD 3. Metabolic acidosis with lactic acidosis. - continue HCO3 in IV fluid for now - f/u renal panel, ABG - nephrology consulted; concerned he will probably need CRRT  Acute hypoxic respiratory failure. Pleural effusions. - f/u CXR - if clinical status gets worse, then might need intubation  Thrombocytopenia. - f/u CBC  DVT prophylaxis - heparin gtt SUP - not indicated Nutrition - NPO except meds Goals of care - full code  CC time 37  minutes  Coralyn HellingVineet Erma Raiche, MD [facility] 12/02/2017, 8:07 AM

## 2017-12-02 NOTE — Progress Notes (Signed)
Paged to see patient.  Offered spiritual support, patient in terrible pain.  His father was visiting with him.  Had prayer regarding the terrible pain he is in-for relief/control of it and for staff providing care as well as peace of mind and heart and for his parents as they see their son so sick. Phebe CollaDonna S Igor Bishop, Chaplain   12/02/17 1100  Clinical Encounter Type  Visited With Patient and family together  Visit Type Spiritual support  Referral From Patient;Nurse  Consult/Referral To Chaplain  Spiritual Encounters  Spiritual Needs Prayer;Emotional  Stress Factors  Patient Stress Factors Not reviewed  Family Stress Factors Not reviewed

## 2017-12-02 NOTE — Progress Notes (Signed)
eLink Physician-Brief Progress Note Patient Name: Marcus [name] DOB: 02/28/1966 MRN: [id number]   Date of Service  12/02/2017  HPI/Events of Note  Hypoglycemia - Blood glucose = 43. D50 given.   eICU Interventions  Will order: 1. D5 0.9 NaCl to run IV at 75 mL/hour.      Intervention Category Major Interventions: Other:  Marcus [name],Marcus [name] 12/02/2017, 10:17 PM

## 2017-12-02 NOTE — Consult Note (Signed)
[facility]    Date of Admission:  2017-12-03           Day 2 IV vancomycin        Day 1 oral vancomycin        Day 1 metronidazole       Reason for Consult: Automatic consultation for MRSA bacteremia     Assessment: Marcus [name] now has MRSA bacteremia and sepsis superimposed upon all of the complications related to his MSSA bacteremia last January.  This is probably related to ongoing injecting drug use.  He is very malnourished and debilitated.  I agree with IV vancomycin.  I will order repeat blood cultures.  TTE is pending.  He is having intermittent diarrhea recently.  His latest [name] screen is antigen positive but toxin negative.  However, I would treat with oral vancomycin to help prevent a severe recurrence.  He does not have any evidence of ileus.  I do not think there is any need for metronidazole at this time.  Plan: 1. Continue IV and oral vancomycin 2. Discontinue metronidazole  3. Repeat blood cultures 4. Await results of TTE  Principal Problem:   MRSA bacteremia Active Problems:   Sepsis (HCC)   Hx of Clostridium difficile infection   Protein-calorie malnutrition, severe   Scheduled Meds: . aspirin  81 mg Oral Daily  . chlorhexidine  15 mL Mouth Rinse BID  . mouth rinse  15 mL Mouth Rinse q12n4p  . vancomycin  500 mg Oral Q6H   Continuous Infusions: . sodium chloride 20 mL/hr at 12/02/17 0900  . DOBUTamine 2.5 mcg/kg/min (12/02/17 0900)  . heparin 1,650 Units/hr (12/02/17 1024)  . metronidazole 100 mL/hr at 12/02/17 0900  . phenylephrine 80mg /512mL (0.16mg /mL) infusion 400 mcg/min (12/02/17 1033)  .  sodium bicarbonate (isotonic) infusion in sterile water 75 mL/hr at 12/02/17 0900  . vancomycin    . vasopressin (PITRESSIN) infusion - *FOR SHOCK* 0.03 Units/min (12/02/17 0900)   PRN Meds:.sodium chloride, acetaminophen, albuterol, ondansetron (ZOFRAN) IV  HPI: Marcus [name] is a 52 y.o. male who was hospitalized in  January with MSSA bacteremia complicated by mitral valve endocarditis, meningitis, septic left knee and thoracic spine infection at the T6-7 level.  He completed 6 weeks of IV cefazolin.  He was readmitted with worsening heart failure.  All admission blood cultures were negative but I elected to treat him again with cefazolin.  He received 17 days of IV cefazolin before converting to oral cephalexin.  He completed 4 weeks of a second course of therapy on 09/05/2017.  He developed [name] colitis while in the hospital and was treated with fidaxomicin.  He completed that therapy on 09/12/2017.    He was readmitted in April with the plan for mitral valve replacement but had recurrent [name] colitis postponing surgery.  He was treated with 2 weeks of metronidazole and 6 weeks of oral vancomycin.  He presented to the emergency department yesterday with fever and hypotension.  He was in septic shock.  Both admission blood cultures have grown MRSA.  He has acute on chronic renal insufficiency.  He does admit to ongoing illicit drug use with his last injection about 1 month ago.  Review of Systems: Review of Systems  Constitutional: Positive for chills and fever. Negative for diaphoresis.  Respiratory: Positive for cough and shortness of breath. Negative for sputum production.   Cardiovascular: Negative for chest pain.  Gastrointestinal: Positive for  diarrhea. Negative for abdominal pain, nausea and vomiting.  Musculoskeletal: Positive for back pain.  Skin: Positive for rash.    Past Medical History:  Diagnosis Date  . Acute kidney failure (HCC) 06/2017   hx/notes 06/13/2017  . Arthritis    "all my joints ache; at atll times" (06/13/2017)  . Bacteremia due to methicillin resistant Staphylococcus aureus    Hattie Perch 06/13/2017  . CHF (congestive heart failure) (HCC)   . Chronic lower back pain   . Endocarditis of mitral valve    MSSA mitral valve endocarditis/notes 06/13/2017  . Hepatitis C     hx/notes 06/13/2017  . 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 Malnutrition of moderate degree 06/19/2017  . Meningitis due to bacteria    Hattie Perch 06/12/2017  . Pleural effusion, bilateral   . Polysubstance abuse (HCC)    hx/notes 06/13/2017  . Septic arthritis of knee, left (HCC) 06/2017   Hattie Perch 06/13/2017  . Septic embolism (HCC) 06/2017   Hattie Perch 06/12/2017  . Severe mitral regurgitation   . Thrombocytopenia (HCC)    hx/notes 06/13/2017    Social History   Tobacco Use  . Smoking status: Former Smoker    Packs/day: 1.00    Years: 10.00    Pack years: 10.00    Types: Cigarettes    Last attempt to quit: 11/04/2015    Years since quitting: 2.0  . Smokeless tobacco: Never Used  Substance Use Topics  . Alcohol use: Yes    Comment: occasionally   . Drug use: No    Family History  Problem Relation Age of Onset  . CAD Father    No Known Allergies  OBJECTIVE: Blood pressure 104/74, pulse 93, temperature 97.7 F (36.5 C), temperature source Oral, resp. rate 18, height 5\' 9"  (1.753 m), weight 179 lb 0.2 oz (81.2 kg), SpO2 94 %.  Physical Exam  Constitutional:  He is alert and resting quietly in bed in the intensive care unit.  HENT:  Mouth/Throat: No oropharyngeal exudate.  Eyes: Conjunctivae are normal.  Neck: Neck supple.  Cardiovascular: Normal rate and regular rhythm.  Murmur heard. Pulmonary/Chest: Effort normal and breath sounds normal.  Abdominal: Soft. He exhibits distension. There is no tenderness.  Musculoskeletal: Normal range of motion. He exhibits no edema or tenderness.  Neurological: He is alert.  Skin:  He has had diffuse mottling of his arms hands and lower legs.  There are multiple excoriated lesions.  He has been on ulcer on the dorsum of his left foot.    Lab Results Lab Results  Component Value Date   WBC 14.2 (H) 12/02/2017   HGB 11.5 (L) 12/02/2017   HCT 38.0 (L) 12/02/2017   MCV 76.8 (L) 12/02/2017   PLT 101 (L) 12/02/2017    Lab  Results  Component Value Date   CREATININE 2.50 (H) 12/02/2017   BUN 47 (H) 12/02/2017   NA 129 (L) 12/02/2017   K 5.6 (H) 12/02/2017   CL 99 12/02/2017   CO2 13 (L) 12/02/2017    Lab Results  Component Value Date   ALT 157 (H) 12/31/2017   AST 113 (H) 12/13/2017   ALKPHOS 102 12/13/2017   BILITOT 2.8 (H) 12/03/2017     Microbiology: Recent Results (from the past 240 hour(s))  Blood culture (routine x 2)     Status: Abnormal (Preliminary result)   Collection Time: 12/15/2017 10:20 AM  Result Value Ref Range Status   Specimen Description BLOOD LEFT FOREARM  Final   Special  Requests   Final    BOTTLES DRAWN AEROBIC AND ANAEROBIC Blood Culture adequate volume   Culture  Setup Time   Final    GRAM POSITIVE COCCI IN BOTH AEROBIC AND ANAEROBIC BOTTLES CRITICAL RESULT CALLED TO, READ BACK BY AND VERIFIED WITH: L SEAY PHARMD 12/29/2017 2319 JDW    Culture (A)  Final    STAPHYLOCOCCUS AUREUS CULTURE REINCUBATED FOR BETTER GROWTH Performed at [facility], [address], [place], [place] [address]    Report Status PENDING  Incomplete  Blood Culture ID Panel (Reflexed)     Status: Abnormal   Collection Time: 12/15/2017 10:20 AM  Result Value Ref Range Status   Enterococcus species NOT DETECTED NOT DETECTED Final   Listeria monocytogenes NOT DETECTED NOT DETECTED Final   Staphylococcus species DETECTED (A) NOT DETECTED Final    Comment: CRITICAL RESULT CALLED TO, READ BACK BY AND VERIFIED WITH: L SEAY PHARMD 12/14/2017 2319 JDW    Staphylococcus aureus DETECTED (A) NOT DETECTED Final    Comment: Methicillin (oxacillin)-resistant Staphylococcus aureus (MRSA). MRSA is predictably resistant to beta-lactam antibiotics (except ceftaroline). Preferred therapy is vancomycin unless clinically contraindicated. Patient requires contact precautions if  hospitalized. CRITICAL RESULT CALLED TO, READ BACK BY AND VERIFIED WITH: L SEAY PHARMD 12/24/2017 2319 JDW    Methicillin resistance DETECTED (A)  NOT DETECTED Final    Comment: CRITICAL RESULT CALLED TO, READ BACK BY AND VERIFIED WITH: L SEAY PHARMD 12/23/2017 2319 JDW    Streptococcus species NOT DETECTED NOT DETECTED Final   Streptococcus agalactiae NOT DETECTED NOT DETECTED Final   Streptococcus pneumoniae NOT DETECTED NOT DETECTED Final   Streptococcus pyogenes NOT DETECTED NOT DETECTED Final   Acinetobacter baumannii NOT DETECTED NOT DETECTED Final   Enterobacteriaceae species NOT DETECTED NOT DETECTED Final   Enterobacter cloacae complex NOT DETECTED NOT DETECTED Final   Escherichia coli NOT DETECTED NOT DETECTED Final   Klebsiella oxytoca NOT DETECTED NOT DETECTED Final   Klebsiella pneumoniae NOT DETECTED NOT DETECTED Final   Proteus species NOT DETECTED NOT DETECTED Final   Serratia marcescens NOT DETECTED NOT DETECTED Final   Haemophilus influenzae NOT DETECTED NOT DETECTED Final   Neisseria meningitidis NOT DETECTED NOT DETECTED Final   Pseudomonas aeruginosa NOT DETECTED NOT DETECTED Final   Candida albicans NOT DETECTED NOT DETECTED Final   Candida glabrata NOT DETECTED NOT DETECTED Final   Candida krusei NOT DETECTED NOT DETECTED Final   Candida parapsilosis NOT DETECTED NOT DETECTED Final   Candida tropicalis NOT DETECTED NOT DETECTED Final  Blood culture (routine x 2)     Status: Abnormal (Preliminary result)   Collection Time: 12/12/2017 11:51 AM  Result Value Ref Range Status   Specimen Description BLOOD RIGHT FOREARM  Final   Special Requests   Final    BOTTLES DRAWN AEROBIC AND ANAEROBIC Blood Culture results may not be optimal due to an inadequate volume of blood received in culture bottles   Culture  Setup Time   Final    GRAM POSITIVE COCCI IN BOTH AEROBIC AND ANAEROBIC BOTTLES CRITICAL VALUE NOTED.  VALUE IS CONSISTENT WITH PREVIOUSLY REPORTED AND CALLED VALUE.    Culture (A)  Final    STAPHYLOCOCCUS AUREUS SUSCEPTIBILITIES TO FOLLOW Performed at [facility], [address], [place], [place]  [address]    Report Status PENDING  Incomplete  C difficile quick scan w PCR reflex     Status: Abnormal   Collection Time: 12/16/2017  8:57 PM  Result Value Ref  Range Status   C Diff antigen POSITIVE (A) NEGATIVE Final   C Diff toxin NEGATIVE NEGATIVE Final   C Diff interpretation Results are indeterminate. See PCR results.  Final    Comment: Performed at [facility], [address] [place], [place] [address]  C. Diff by PCR, Reflexed     Status: Abnormal   Collection Time: 12/06/2017  8:57 PM  Result Value Ref Range Status   Toxigenic [name] by PCR POSITIVE (A) NEGATIVE Final    Comment: Positive for toxigenic [name] with little to no toxin production. Only treat if clinical presentation suggests symptomatic illness. Performed at [facility], [address] [place], [place] [address]     Cliffton AstersJohn Kennie Karapetian, MD [facility] [phone] pager   [phone] cell 12/02/2017, 10:39 AM

## 2017-12-02 NOTE — Progress Notes (Addendum)
eLink Physician-Brief Progress Note Patient Name: Marcus [name] DOB: 01/06/1966 MRN: [id number]   Date of Service  12/02/2017  HPI/Events of Note  Multiple issues: 1. COOX = 48.4% and 2. Troponin = 1.07 --> 1.74 --> 2.77. Can't use B-Blocker d/t hypotension.   eICU Interventions  Will order: 1. ASA 81 mg PO now and Q day. 2. Heparin IV infusion per pharmacy consult. 3. D/C Heparin [place]. 4. Dobutamine IV infusion @ 2.5 mcg/kg/min.  5. Cardiology following patient already. Spoke with cardiology on call --> medical management.      Intervention Category Intermediate Interventions: Diagnostic test evaluation  Sommer,Steven Eugene 12/02/2017, 1:04 AM

## 2017-12-02 NOTE — Procedures (Signed)
  Hemodialysis Catheter Insertion Procedure Note Marcus [name] [id number] 01/03/1966  Procedure: Insertion of HemoKern Reapdialysis Catheter Indications: Hemodialysis  Procedure Details Consent: Risks of procedure as well as the alternatives and risks of each were explained to the (patient/caregiver).  Consent for procedure obtained.  Time Out: Verified patient identification, verified procedure, site/side was marked, verified correct patient position, special equipment/implants available, medications/allergies/relevent history reviewed, required imaging and test results available.  Performed  Maximum sterile technique was used including antiseptics, cap, gloves, gown, hand hygiene, mask and sheet.  Skin prep: Chlorhexidine; local anesthetic administered  A Trialysis HD catheter was placed in the right internal jugular vein using the Seldinger technique. Biopatch placed over insertion site, catheter sutured in placed and covered with sterile dressing.  Evaluation Blood flow good Complications: No apparent complications Patient did tolerate procedure well. Chest X-ray ordered to verify placement.  CXR: pending.   Procedure performed under direct supervision of Dr. Craige CottaSood and Joneen RoachPaul Hoffman NP, and with ultrasound guidance for real time vessel cannulation.      Marcus [name], AGACNP-BC [facility] Pgr: [phone] or [phone] 12/02/2017, 4:45 PM

## 2017-12-02 NOTE — Progress Notes (Signed)
Pharmacy Antibiotic Note  Marcus [name] is a 52 y.o. male admitted on 12/23/2017 with MRSA bacteremia/sepsis.  Pharmacy has been consulted for vancomycin dosing - day #2. Also continuing on vancomycin PO for Cdiff+. SCr up to 2.52 (1.3 on 6/25). Now patient started CRRT tonight at ~1830 due to oliguric ARF with profound metabolic acidosis and hyperkalemia, continuing on vasopressors.  Vancomycin 1500mg  IV load on 7/1; then 1250mg  IV dose on 7/2 at 1418.  Plan: Check VR with AM labs to assess when to re-dose on CRRT - will d/c current vancomycin dose for now pending level Monitor clinical progress, c/s, abx plan/LOT, vancomycin levels as indicated F/u CRRT tolerance and Renal plans   Height: 5\' 9"  (175.3 cm) Weight: 179 lb 0.2 oz (81.2 kg) IBW/kg (Calculated) : 70.7  Temp (24hrs), Avg:97.9 F (36.6 C), Min:97.5 F (36.4 C), Max:99 F (37.2 C)  Recent Labs  Lab 12/19/2017 1020  12/08/2017 1317 12/21/2017 1545 12/24/2017 2306 12/02/17 0335 12/02/17 0634 12/02/17 0839 12/02/17 1650  WBC 5.4  --   --   --   --  14.2*  --   --   --   CREATININE 2.31*  --   --   --   --  2.50*  --   --  2.52*  LATICACIDVEN  --    < > 9.72* 4.7* 7.4*  --  9.3* 10.1*  --    < > = values in this interval not displayed.    Estimated Creatinine Clearance: 34.3 mL/min (A) (by C-G formula based on SCr of 2.52 mg/dL (H)).    No Known Allergies  Babs BertinHaley Tyrrell Stephens, PharmD, BCPS Clinical Pharmacist Clinical phone [phone] Please check AMION for all [facility] contact numbers 12/02/2017 7:29 PM

## 2017-12-02 NOTE — Progress Notes (Signed)
ANTICOAGULATION CONSULT NOTE - Initial Consult  Pharmacy Consult for heparin Indication: chest pain/ACS  No Known Allergies  Patient Measurements: Height: 5\' 9"  (175.3 cm) Weight: 177 lb 11.1 oz (80.6 kg) IBW/kg (Calculated) : 70.7  Vital Signs: Temp: 98.4 F (36.9 C) (07/01 2329) Temp Source: Oral (07/01 2329) BP: 105/71 (07/02 0000) Pulse Rate: 122 (07/02 0000)  Labs: Recent Labs    08/22/17 1020 08/22/17 1545 08/22/17 2306  HGB 12.6*  --   --   HCT 42.2  --   --   PLT 112*  --   --   LABPROT 20.0*  --   --   INR 1.72  --   --   CREATININE 2.31*  --   --   TROPONINI 1.07* 1.74* 2.77*    Estimated Creatinine Clearance: 37.4 mL/min (A) (by C-G formula based on SCr of 2.31 mg/dL (H)).   Medical History: Past Medical History:  Diagnosis Date  . Acute kidney failure (HCC) 06/2017   hx/notes 06/13/2017  . Arthritis    "all my joints ache; at atll times" (06/13/2017)  . Bacteremia due to methicillin resistant Staphylococcus aureus    Hattie Perch/notes 06/13/2017  . CHF (congestive heart failure) (HCC)   . Chronic lower back pain   . Endocarditis of mitral valve    MSSA mitral valve endocarditis/notes 06/13/2017  . Hepatitis C    hx/notes 06/13/2017  . 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 Malnutrition of moderate degree 06/19/2017  . Meningitis due to bacteria    Hattie Perch/notes 06/12/2017  . Pleural effusion, bilateral   . Polysubstance abuse (HCC)    hx/notes 06/13/2017  . Septic arthritis of knee, left (HCC) 06/2017   Hattie Perch/notes 06/13/2017  . Septic embolism (HCC) 06/2017   Hattie Perch/notes 06/12/2017  . Severe mitral regurgitation   . Thrombocytopenia (HCC)    hx/notes 06/13/2017    Medications:  Medications Prior to Admission  Medication Sig Dispense Refill Last Dose  . torsemide (DEMADEX) 20 MG tablet Take 4 tablets (80 mg total) by mouth daily. (Patient taking differently: Take 40 mg by mouth 2 (two) times daily. ) 120 tablet 11 unknown  . acetaminophen (TYLENOL) 325 MG  tablet Take 650 mg by mouth every 6 (six) hours as needed for mild pain or fever.   unknown at prn  . albuterol (PROVENTIL HFA;VENTOLIN HFA) 108 (90 Base) MCG/ACT inhaler Inhale 2 puffs into the lungs 2 (two) times daily.   Not Taking at Unknown time  . aspirin 81 MG EC tablet Take 1 tablet (81 mg total) by mouth daily. (Patient not taking: Reported on 11/28/2017) 15 tablet 0 Not Taking at Unknown time  . atorvastatin (LIPITOR) 20 MG tablet Take 1 tablet (20 mg total) by mouth daily. 30 tablet 4 unknown  . gabapentin (NEURONTIN) 300 MG capsule Take 300 mg by mouth 3 (three) times daily.   Not Taking at Unknown time  . potassium chloride SA (K-DUR,KLOR-CON) 20 MEQ tablet Take 2 tablets (40 mEq total) by mouth 2 (two) times daily. 120 tablet 3 unknown  . thiamine 100 MG tablet Take 1 tablet (100 mg total) by mouth daily. 30 tablet 0 unknown   Scheduled:  . aspirin  81 mg Oral Daily  . chlorhexidine  15 mL Mouth Rinse BID  . mouth rinse  15 mL Mouth Rinse q12n4p   Infusions:  . sodium chloride 20 mL/hr at 12/02/17 0000  . DOBUTamine    . norepinephrine (LEVOPHED) Adult infusion    .  phenylephrine 270 mcg/min (12/02/17 0000)  . piperacillin-tazobactam (ZOSYN)  IV Stopped (2017-12-04 1610)  . vancomycin      Assessment: 52yo male admitted w/ hypotension and ?sepsis w/ acute hypoxic respiratory failure, admitted to ICU, now w/ troponins trending up 1.07>1.74>2.77, to transition from SQ UFH to heparin gtt.  Goal of Therapy:  Heparin level 0.3-0.7 units/ml Monitor platelets by anticoagulation protocol: Yes   Plan:  SQ heparin given 2h ago; will start heparin gtt at 1500 units/hr (recently required heparin up to 1950 units/hr for admission for Afib) and monitor heparin levels and CBC.  Vernard Gambles, PharmD, BCPS  12/02/2017,1:12 AM
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
## 2017-12-02 NOTE — Progress Notes (Signed)
Attempt to place right IJ HD catheter.  Attempt was aborted due to pt anxiety, restlessness,and inability to remain still.  No change in vital signs.  F/u CXR ordered and pending.

## 2017-12-02 NOTE — Progress Notes (Signed)
ANTICOAGULATION & ANTIBIOTIC CONSULT NOTE  Pharmacy Consult for heparin; Vancomycin Indication: chest pain/ACS; MRSA bacteremia   No Known Allergies  Patient Measurements: Height: 5\' 9"  (175.3 cm) Weight: 179 lb 0.2 oz (81.2 kg) IBW/kg (Calculated) : 70.7  Vital Signs: Temp: 97.7 F (36.5 C) (07/02 0739) Temp Source: Oral (07/02 0739) BP: 96/79 (07/02 0800) Pulse Rate: 91 (07/02 0800)  Labs: Recent Labs    12/24/2017 1020 12-24-2017 1545 24-Dec-2017 2306 12/02/17 0335  HGB 12.6*  --   --  11.5*  HCT 42.2  --   --  38.0*  PLT 112*  --   --  101*  LABPROT 20.0*  --   --   --   INR 1.72  --   --   --   CREATININE 2.31*  --   --  2.50*  TROPONINI 1.07* 1.74* 2.77*  --     Estimated Creatinine Clearance: 34.6 mL/min (A) (by C-G formula based on SCr of 2.5 mg/dL (H)).   Medical History: Past Medical History:  Diagnosis Date  . Acute kidney failure (HCC) 06/2017   hx/notes 06/13/2017  . Arthritis    "all my joints ache; at atll times" (06/13/2017)  . Bacteremia due to methicillin resistant Staphylococcus aureus    Hattie Perch 06/13/2017  . CHF (congestive heart failure) (HCC)   . Chronic lower back pain   . Endocarditis of mitral valve    MSSA mitral valve endocarditis/notes 06/13/2017  . Hepatitis C    hx/notes 06/13/2017  . 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 Malnutrition of moderate degree 06/19/2017  . Meningitis due to bacteria    Hattie Perch 06/12/2017  . Pleural effusion, bilateral   . Polysubstance abuse (HCC)    hx/notes 06/13/2017  . Septic arthritis of knee, left (HCC) 06/2017   Hattie Perch 06/13/2017  . Septic embolism (HCC) 06/2017   Hattie Perch 06/12/2017  . Severe mitral regurgitation   . Thrombocytopenia (HCC)    hx/notes 06/13/2017    Medications:  Medications Prior to Admission  Medication Sig Dispense Refill Last Dose  . torsemide (DEMADEX) 20 MG tablet Take 4 tablets (80 mg total) by mouth daily. (Patient taking differently: Take 40 mg by mouth 2 (two)  times daily. ) 120 tablet 11 unknown  . acetaminophen (TYLENOL) 325 MG tablet Take 650 mg by mouth every 6 (six) hours as needed for mild pain or fever.   unknown at prn  . albuterol (PROVENTIL HFA;VENTOLIN HFA) 108 (90 Base) MCG/ACT inhaler Inhale 2 puffs into the lungs 2 (two) times daily.   Not Taking at Unknown time  . atorvastatin (LIPITOR) 20 MG tablet Take 1 tablet (20 mg total) by mouth daily. 30 tablet 4 unknown  . gabapentin (NEURONTIN) 300 MG capsule Take 300 mg by mouth 3 (three) times daily.   Not Taking at Unknown time  . potassium chloride SA (K-DUR,KLOR-CON) 20 MEQ tablet Take 2 tablets (40 mEq total) by mouth 2 (two) times daily. 120 tablet 3 unknown  . thiamine 100 MG tablet Take 1 tablet (100 mg total) by mouth daily. 30 tablet 0 unknown   Scheduled:  . aspirin  81 mg Oral Daily  . chlorhexidine  15 mL Mouth Rinse BID  . mouth rinse  15 mL Mouth Rinse q12n4p  . vancomycin  500 mg Oral Q6H   Infusions:  . sodium chloride 20 mL/hr at 12/02/17 0800  . DOBUTamine 2.5 mcg/kg/min (12/02/17 0800)  . heparin 1,500 Units/hr (12/02/17 0800)  . metronidazole    .  phenylephrine 80mg /52200mL (0.16mg /mL) infusion 400 mcg/min (12/02/17 0800)  .  sodium bicarbonate (isotonic) infusion in sterile water 75 mL/hr at 12/02/17 0800  . vancomycin    . vasopressin (PITRESSIN) infusion - *FOR SHOCK*      Assessment: AC: 52yo male admitted w/ hypotension and ?sepsis w/ acute hypoxic respiratory failure, admitted to ICU, w/ troponins trending up 1.07>1.74>2.77 and transitioned to IV heparin. Initial heparin level on 1500 units/hr is subtherapeutic at 0.21. H/H 11.5/38. Plt down to 101. No bleeding noted per RN   ID: Blood cultures grew 4/4 MRSA on BCID. Zosyn has been d/c'ed. WBC up to 14.2, SCr up to 2.5. CrCl ~ 35 mL/min.   Goal of Therapy:  Heparin level 0.3-0.7 units/ml Monitor platelets by anticoagulation protocol: Yes   Plan:  Give heparin IV bolus of 1000 units once Increase heparin  gtt to 1650 units/hr (recently required heparin up to 1950 units/hr for admission for Afib) F/u 6 hr HL  Monitor heparin levels and CBC Increase vancomycin to 1250 mg IV Q 24 hours Monitor CBC, renal fx, cultures and [facility]   Vinnie LevelBenjamin Buena Boehm, PharmD., BCPS Clinical Pharmacist Clinical phone for 12/02/17 until 3:30pm: [phone] If after 3:30pm, please [facility] to [facility] for unit-specific pharmacist

## 2017-12-03 ENCOUNTER — Inpatient Hospital Stay (HOSPITAL_COMMUNITY): Payer: Medicaid Other

## 2017-12-03 DIAGNOSIS — N170 Acute kidney failure with tubular necrosis: Secondary | ICD-10-CM

## 2017-12-03 DIAGNOSIS — I361 Nonrheumatic tricuspid (valve) insufficiency: Secondary | ICD-10-CM

## 2017-12-03 LAB — POCT I-STAT EG7
Acid-Base Excess: 8 mmol/L — ABNORMAL HIGH (ref 0.0–2.0)
BICARBONATE: 33.4 mmol/L — AB (ref 20.0–28.0)
CALCIUM ION: 0.93 mmol/L — AB (ref 1.15–1.40)
HCT: 41 % (ref 39.0–52.0)
Hemoglobin: 13.9 g/dL (ref 13.0–17.0)
O2 Saturation: 64 %
PH VEN: 7.445 — AB (ref 7.250–7.430)
Patient temperature: 97.8
Potassium: 3.8 mmol/L (ref 3.5–5.1)
SODIUM: 138 mmol/L (ref 135–145)
TCO2: 35 mmol/L — AB (ref 22–32)
pCO2, Ven: 48.4 mmHg (ref 44.0–60.0)
pO2, Ven: 32 mmHg (ref 32.0–45.0)

## 2017-12-03 LAB — RENAL FUNCTION PANEL
Albumin: 2.1 g/dL — ABNORMAL LOW (ref 3.5–5.0)
Albumin: 2 g/dL — ABNORMAL LOW (ref 3.5–5.0)
Anion gap: 17 — ABNORMAL HIGH (ref 5–15)
Anion gap: 9 (ref 5–15)
BUN: 39 mg/dL — AB (ref 6–20)
BUN: 33 mg/dL — ABNORMAL HIGH (ref 6–20)
CHLORIDE: 93 mmol/L — AB (ref 98–111)
CHLORIDE: 94 mmol/L — AB (ref 98–111)
CO2: 24 mmol/L (ref 22–32)
CO2: 32 mmol/L (ref 22–32)
Calcium: 6.5 mg/dL — ABNORMAL LOW (ref 8.9–10.3)
Calcium: 6.3 mg/dL — CL (ref 8.9–10.3)
Creatinine, Ser: 1.91 mg/dL — ABNORMAL HIGH (ref 0.61–1.24)
Creatinine, Ser: 1.64 mg/dL — ABNORMAL HIGH (ref 0.61–1.24)
GFR calc Af Amer: 45 mL/min — ABNORMAL LOW (ref >60)
GFR calc Af Amer: 54 mL/min — ABNORMAL LOW (ref >60)
GFR calc non Af Amer: 39 mL/min — ABNORMAL LOW (ref >60)
GFR calc non Af Amer: 47 mL/min — ABNORMAL LOW (ref >60)
GLUCOSE: 125 mg/dL — AB (ref 70–99)
GLUCOSE: 171 mg/dL — AB (ref 70–99)
POTASSIUM: 4.8 mmol/L (ref 3.5–5.1)
POTASSIUM: 3.7 mmol/L (ref 3.5–5.1)
Phosphorus: 3.8 mg/dL (ref 2.5–4.6)
Phosphorus: 2.3 mg/dL — ABNORMAL LOW (ref 2.5–4.6)
SODIUM: 135 mmol/L (ref 135–145)
Sodium: 134 mmol/L — ABNORMAL LOW (ref 135–145)

## 2017-12-03 LAB — POCT ACTIVATED CLOTTING TIME
ACTIVATED CLOTTING TIME: 180 s
ACTIVATED CLOTTING TIME: 175 s
ACTIVATED CLOTTING TIME: 180 s
ACTIVATED CLOTTING TIME: 197 s
ACTIVATED CLOTTING TIME: 180 s
ACTIVATED CLOTTING TIME: 175 s
Activated Clotting Time: 158 seconds
Activated Clotting Time: 164 seconds
Activated Clotting Time: 164 seconds
Activated Clotting Time: 180 seconds
Activated Clotting Time: 197 seconds
Activated Clotting Time: 202 seconds
Activated Clotting Time: 202 seconds
Activated Clotting Time: 208 seconds
Activated Clotting Time: 175 seconds

## 2017-12-03 LAB — POCT I-STAT 3, ART BLOOD GAS (G3+)
Acid-Base Excess: 13 mmol/L — ABNORMAL HIGH (ref 0.0–2.0)
Bicarbonate: 35.9 mmol/L — ABNORMAL HIGH (ref 20.0–28.0)
O2 Saturation: 99 %
TCO2: 37 mmol/L — AB (ref 22–32)
pCO2 arterial: 37 mmHg (ref 32.0–48.0)
pH, Arterial: 7.593 — ABNORMAL HIGH (ref 7.350–7.450)
pO2, Arterial: 131 mmHg — ABNORMAL HIGH (ref 83.0–108.0)

## 2017-12-03 LAB — POCT I-STAT 7, (LYTES, BLD GAS, ICA,H+H)
ACID-BASE EXCESS: 6 mmol/L — AB (ref 0.0–2.0)
BICARBONATE: 27.9 mmol/L (ref 20.0–28.0)
CALCIUM ION: 0.88 mmol/L — AB (ref 1.15–1.40)
HCT: 38 % — ABNORMAL LOW (ref 39.0–52.0)
Hemoglobin: 12.9 g/dL — ABNORMAL LOW (ref 13.0–17.0)
O2 SAT: 99 %
Potassium: 4.3 mmol/L (ref 3.5–5.1)
SODIUM: 133 mmol/L — AB (ref 135–145)
TCO2: 29 mmol/L (ref 22–32)
pCO2 arterial: 31.5 mmHg — ABNORMAL LOW (ref 32.0–48.0)
pH, Arterial: 7.553 — ABNORMAL HIGH (ref 7.350–7.450)
pO2, Arterial: 113 mmHg — ABNORMAL HIGH (ref 83.0–108.0)

## 2017-12-03 LAB — ECHOCARDIOGRAM COMPLETE
HEIGHTINCHES: 69 in
WEIGHTICAEL: 2850.11 [oz_av]

## 2017-12-03 LAB — CULTURE, BLOOD (ROUTINE X 2): Special Requests: ADEQUATE

## 2017-12-03 LAB — BLOOD GAS, ARTERIAL
Acid-Base Excess: 6.4 mmol/L — ABNORMAL HIGH (ref 0.0–2.0)
Bicarbonate: 29.3 mmol/L — ABNORMAL HIGH (ref 20.0–28.0)
Drawn by: 257081
FIO2: 40
O2 Saturation: 99.4 %
PATIENT TEMPERATURE: 94.6
PCO2 ART: 30 mmHg — AB (ref 32.0–48.0)
PEEP: 5 cmH2O
PH ART: 7.583 — AB (ref 7.350–7.450)
PO2 ART: 106 mmHg (ref 83.0–108.0)
RATE: 18 resp/min
VT: 570 mL

## 2017-12-03 LAB — PHOSPHORUS
Phosphorus: 2.9 mg/dL (ref 2.5–4.6)
Phosphorus: 2.3 mg/dL — ABNORMAL LOW (ref 2.5–4.6)

## 2017-12-03 LAB — APTT: aPTT: 56 seconds — ABNORMAL HIGH (ref 24–36)

## 2017-12-03 LAB — CBC
HEMATOCRIT: 35.1 % — AB (ref 39.0–52.0)
Hemoglobin: 11 g/dL — ABNORMAL LOW (ref 13.0–17.0)
MCH: 23.5 pg — AB (ref 26.0–34.0)
MCHC: 31.3 g/dL (ref 30.0–36.0)
MCV: 74.8 fL — AB (ref 78.0–100.0)
Platelets: 68 10*3/uL — ABNORMAL LOW (ref 150–400)
RBC: 4.69 MIL/uL (ref 4.22–5.81)
RDW: 21.7 % — ABNORMAL HIGH (ref 11.5–15.5)
WBC: 13.7 10*3/uL — ABNORMAL HIGH (ref 4.0–10.5)

## 2017-12-03 LAB — GLUCOSE, CAPILLARY
GLUCOSE-CAPILLARY: 118 mg/dL — AB (ref 70–99)
GLUCOSE-CAPILLARY: 118 mg/dL — AB (ref 70–99)
GLUCOSE-CAPILLARY: 154 mg/dL — AB (ref 70–99)
GLUCOSE-CAPILLARY: 132 mg/dL — AB (ref 70–99)
Glucose-Capillary: 109 mg/dL — ABNORMAL HIGH (ref 70–99)
Glucose-Capillary: 140 mg/dL — ABNORMAL HIGH (ref 70–99)

## 2017-12-03 LAB — MAGNESIUM
Magnesium: 1.9 mg/dL (ref 1.7–2.4)
Magnesium: 1.6 mg/dL — ABNORMAL LOW (ref 1.7–2.4)
Magnesium: 1.7 mg/dL (ref 1.7–2.4)

## 2017-12-03 LAB — LACTIC ACID, PLASMA: LACTIC ACID, VENOUS: 2.8 mmol/L — AB (ref 0.5–1.9)

## 2017-12-03 LAB — VANCOMYCIN, RANDOM: Vancomycin Rm: 22

## 2017-12-03 MED ORDER — VITAL HIGH PROTEIN PO LIQD
1000.0000 mL | ORAL | Status: DC
  Administered 2017-12-04 – 2017-12-08 (×6): 1000 mL
  Filled 2017-12-03 (×2): qty 1000

## 2017-12-03 MED ORDER — PRISMASOL BGK 4/2.5 32-4-2.5 MEQ/L IV SOLN
INTRAVENOUS | Status: DC
  Administered 2017-12-03 – 2017-12-10 (×8): via INTRAVENOUS_CENTRAL
  Filled 2017-12-03 (×11): qty 5000

## 2017-12-03 MED ORDER — VITAL HIGH PROTEIN PO LIQD
1000.0000 mL | ORAL | Status: DC
  Administered 2017-12-03: 1000 mL

## 2017-12-03 MED ORDER — PRO-STAT SUGAR FREE PO LIQD
30.0000 mL | Freq: Every day | ORAL | Status: DC
  Administered 2017-12-04 – 2017-12-10 (×7): 30 mL
  Filled 2017-12-03 (×7): qty 30

## 2017-12-03 MED ORDER — PRISMASOL BGK 4/2.5 32-4-2.5 MEQ/L IV SOLN
INTRAVENOUS | Status: DC
  Administered 2017-12-03 – 2017-12-10 (×18): via INTRAVENOUS_CENTRAL
  Filled 2017-12-03 (×18): qty 5000

## 2017-12-03 MED ORDER — VANCOMYCIN HCL IN DEXTROSE 1-5 GM/200ML-% IV SOLN
1000.0000 mg | INTRAVENOUS | Status: DC
  Administered 2017-12-03 – 2017-12-09 (×7): 1000 mg via INTRAVENOUS
  Filled 2017-12-03 (×8): qty 200

## 2017-12-03 MED ORDER — PRO-STAT SUGAR FREE PO LIQD
30.0000 mL | Freq: Two times a day (BID) | ORAL | Status: DC
  Administered 2017-12-03: 30 mL
  Filled 2017-12-03: qty 30

## 2017-12-03 NOTE — Progress Notes (Signed)
Pharmacy Antibiotic Note  Marcus [name] is a 52 y.o. male admitted on 12/30/2017 with MRSA bacteremia/sepsis.  Pharmacy has been consulted for vancomycin dosing - day #3. Also continuing on vancomycin PO for Cdiff+. Patient was started on CRRT last night due to oliguric ARF with profound metabolic acidosis and hyperkalemia, continuing on vasopressors.  A vancomycin random level this AM was slightly supratherapeutic at 22  Plan: Start vancomycin 1 gm IV Q 24 hours. [facility]  Monitor clinical progress, c/s, abx plan/LOT, vancomycin levels as indicated F/u CRRT tolerance and Renal plans   Height: 5\' 9"  (175.3 cm) Weight: 178 lb 2.1 oz (80.8 kg) IBW/kg (Calculated) : 70.7  Temp (24hrs), Avg:96.1 F (35.6 C), Min:93.8 F (34.3 C), Max:97.5 F (36.4 C)  Recent Labs  Lab 12/15/2017 1020  12/20/2017 1317 12/02/2017 1545 12/09/2017 2306 12/02/17 0335 12/02/17 0634 12/02/17 0839 12/02/17 1650 12/03/17 0417  WBC 5.4  --   --   --   --  14.2*  --   --   --  13.7*  CREATININE 2.31*  --   --   --   --  2.50*  --   --  2.52* 1.91*  LATICACIDVEN  --    < > 9.72* 4.7* 7.4*  --  9.3* 10.1*  --   --   VANCORANDOM  --   --   --   --   --   --   --   --   --  22   < > = values in this interval not displayed.    Estimated Creatinine Clearance: 45.2 mL/min (A) (by C-G formula based on SCr of 1.91 mg/dL (H)).    No Known Allergies  Vinnie LevelBenjamin Camesha Farooq, PharmD., BCPS Clinical Pharmacist Clinical phone for 12/03/17 until 3:30pm: [phone] If after 3:30pm, [facility] for [facility]specific [facility]ist

## 2017-12-03 NOTE — Progress Notes (Signed)
CRITICAL VALUE ALERT  Critical Value:  Calcium 6.3  Date & Time Notied:  12/03/17 1815  Provider Notified: Dr. Craige CottaSood  Orders Received/Actions taken: No new orders

## 2017-12-03 NOTE — Progress Notes (Signed)
Notified ELink MD about sustained BP 70's/50's with MAPs <60. Max doses of neosynephrine, vasopressin, and dobutamine reached. No new orders given. Will continue to monitor.

## 2017-12-03 NOTE — Progress Notes (Signed)
Spoke with pts mother Voctoria BaLisette Grinderines who had questions about HCPOA and she had thought that her grandson was HCPOA and had signed a document in early Jan/2019 for him to be.  She stated that she was unsure if he still wanted to be the HCPOA.  Will order a social work consult to address.  Erick Blinksuchman, Jerett Odonohue D, RN

## 2017-12-03 NOTE — Progress Notes (Signed)
PULMONARY / CRITICAL CARE MEDICINE   Name: CORAN DIPAOLA MRN: [id number] DOB: 04-06-1966    ADMISSION DATE:  12/20/2017  REFERRING MD:  ED  CHIEF COMPLAINT: Weakness  HISTORY OF PRESENT ILLNESS:   52 yo male former smoker presented with fever, weakness, hypotension.  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  He was tx for C diff in March and April of 2019.  UDS positive for opiates, cocaine.  PAST MEDICAL HISTORY :  Hepatitis C, Meningitis, Thrombocytopenia, Neuropathy  SUBJECTIVE:  Remains on pressors, CRRT, vent support.  Not needing much for sedation.  VITAL SIGNS: BP 111/87   Pulse 74   Temp (!) 94.6 F (34.8 C) (Axillary)   Resp 16   Ht 5\' 9"  (1.753 m)   Wt 178 lb 2.1 oz (80.8 kg)   SpO2 100%   BMI 26.31 kg/m   INTAKE / OUTPUT: I/O last 3 completed shifts: In: 8424.9 [I.V.:7811.6; NG/GT:140; IV Piggyback:473.3] Out: 3244 [Urine:125; Other:3119]  PHYSICAL EXAMINATION:  General - ill appearing Eyes - pupils reactive ENT - ETT in place Cardiac - regular, 2/6 systolic murmur Chest - b/l rhonchi Abd - soft, non tender Ext - 2+ edema Skin - ischemic changes in hand and feet with Rt foot being worst Neuro - obtunded, doesn't follow commands  LABS:  BMET Recent Labs  Lab 12/02/17 0335 12/02/17 1650 12/03/17 0417 12/03/17 0423  NA 129* 134* 134* 133*  K 5.6* 5.7* 4.8 4.3  CL 99 103 93*  --   CO2 13* 14* 24  --   BUN 47* 51* 39*  --   CREATININE 2.50* 2.52* 1.91*  --   GLUCOSE 66* 50* 125*  --     Electrolytes Recent Labs  Lab 12/02/17 0335 12/02/17 1650 12/03/17 0417  CALCIUM 7.7* 7.2* 6.5*  MG 2.1  --  1.9  PHOS 5.2* 6.3* 3.8    CBC Recent Labs  Lab 12/09/2017 1020 12/02/17 0335 12/03/17 0417 12/03/17 0423  WBC 5.4 14.2* 13.7*  --   HGB 12.6* 11.5* 11.0* 12.9*  HCT 42.2 38.0* 35.1* 38.0*  PLT 112* 101* 68*  --     Coag's Recent Labs  Lab 12/25/2017 1020 12/03/17 0417  APTT  --  56*   INR 1.72  --     Sepsis Markers Recent Labs  Lab 12/22/2017 1020  12/09/2017 2306 12/02/17 0634 12/02/17 0839  LATICACIDVEN  --    < > 7.4* 9.3* 10.1*  PROCALCITON 23.04  --   --   --   --    < > = values in this interval not displayed.    ABG Recent Labs  Lab 12/02/17 1452 12/03/17 0423 12/03/17 0800  PHART 7.261* 7.553* 7.583*  PCO2ART 26.5* 31.5* 30.0*  PO2ART 415* 113.0* 106    Liver Enzymes Recent Labs  Lab 12/26/2017 1020 12/02/17 1650 12/03/17 0417  AST 113*  --   --   ALT 157*  --   --   ALKPHOS 102  --   --   BILITOT 2.8*  --   --   ALBUMIN 2.7* 2.3* 2.1*    Cardiac Enzymes Recent Labs  Lab 12/20/2017 1020 12/23/2017 1545 12/02/2017 2306  TROPONINI 1.07* 1.74* 2.77*    Glucose Recent Labs  Lab 12/02/17 2012 12/02/17 2112 12/02/17 2305 12/03/17 0354 12/03/17 0816  GLUCAP 46* 96 84 109* 118*    Imaging Dg Abd 1 View  Result Date: 12/02/2017 CLINICAL DATA:  52 year old male with nasogastric tube placement. Subsequent encounter. EXAM: ABDOMEN - 1 VIEW COMPARISON:  12/02/2017 2:28 p.m. FINDINGS: Nasogastric tube tip gastric antrum level with side hole proximal gastric body level. Paucity of bowel gas. Postsurgical changes left acetabulum with left hip joint degenerative changes. IMPRESSION: Nasogastric tube tip gastric antrum level with side hole proximal gastric body level. Electronically Signed   By: Lacy Duverney M.D.   On: 12/02/2017 20:04   Dg Chest Port 1 View  Result Date: 12/03/2017 CLINICAL DATA:  Shortness of breath, pleural effusion. EXAM: PORTABLE CHEST 1 VIEW COMPARISON:  Radiograph December 02, 2017. FINDINGS: Stable cardiomegaly. Endotracheal and nasogastric tubes are unchanged in position. Bilateral internal jugular catheters are unchanged in position. No pneumothorax is noted. Stable bibasilar opacities are noted concerning for atelectasis or edema with associated pleural effusions. Bony thorax is unremarkable. IMPRESSION: Stable support  apparatus. Stable bilateral lung opacities as described above. Electronically Signed   By: Lupita Raider, M.D.   On: 12/03/2017 07:22   Dg Chest Port 1 View  Result Date: 12/02/2017 CLINICAL DATA:  Central line placement. EXAM: PORTABLE CHEST 1 VIEW COMPARISON:  12/02/2017 and prior radiographs FINDINGS: A RIGHT IJ central venous catheter is noted with tip overlying the mid SVC. No pneumothorax. An endotracheal tube with tip 3 cm above the carina, LEFT IJ central venous catheter with tip overlying the LOWER SVC, and NG tube tip overlying the LOWER esophagus/EG junction again noted. Bilateral pleural effusions and bilateral LOWER lung atelectasis again noted. IMPRESSION: RIGHT IJ central venous catheter with tip overlying the mid SVC. No pneumothorax. Otherwise unchanged appearance of the chest with bilateral pleural effusions and bilateral LOWER lung atelectasis. Electronically Signed   By: Harmon Pier M.D.   On: 12/02/2017 17:32   Dg Chest Port 1 View  Result Date: 12/02/2017 CLINICAL DATA:  Check endotracheal tube placement EXAM: PORTABLE CHEST 1 VIEW COMPARISON:  12/02/2017 FINDINGS: Left jugular central line is again seen. Endotracheal tube and nasogastric catheter are noted. The nasogastric catheter is short of its expected location within the stomach. Bilateral pleural effusions are again noted and stable. Cardiomegaly is seen and stable. IMPRESSION: Endotracheal tube and nasogastric catheter as described. The remainder of the chest is stable from the prior exam. Electronically Signed   By: Alcide Clever M.D.   On: 12/02/2017 14:48   Dg Chest Port 1 View  Result Date: 12/02/2017 CLINICAL DATA:  Attempted right internal jugular central venous catheter placement. EXAM: PORTABLE CHEST 1 VIEW COMPARISON:  12/02/2017 at 0435 hours FINDINGS: A left jugular catheter terminates over the lower SVC, unchanged. The cardiomediastinal silhouette is unchanged with cardiac enlargement again noted. Bilateral pleural  effusions and bibasilar parenchymal lung opacities suggesting atelectasis are unchanged. No pneumothorax is identified. IMPRESSION: 1. No pneumothorax following attempted central line placement. 2. Unchanged bilateral pleural effusions and basilar atelectasis. Electronically Signed   By: Sebastian Ache M.D.   On: 12/02/2017 13:28   Dg Abd Portable 1v  Result Date: 12/02/2017 CLINICAL DATA:  Check nasogastric catheter placement EXAM: PORTABLE ABDOMEN - 1 VIEW COMPARISON:  None. FINDINGS: Scattered large and small bowel gas is noted. There is a nasogastric catheter identified although the catheter is short of the stomach with both the proximal side port and tip lying in the distal esophagus. This should be advanced several cm. IMPRESSION: Nasogastric catheter in the distal esophagus. Electronically Signed   By: Alcide Clever M.D.   On: 12/02/2017 14:52     STUDIES:  Echo 7/03 >>  CULTURES: Blood 7/01 >> MRSA C diff PCR 7/01 >> Ag positive, toxin negative, PCR positive  ANTIBIOTICS: Zosyn 7/01 >> 7/02 IV Vancomycin 7/01 >> PO Vancomycin 7/02 >>  SIGNIFICANT EVENTS: 7/01 Admit, CHF team consulted 7/02 Renal and start CRRT, ID consulted  LINES/TUBES: Lt IJ CVL 7/01 >>  ETT 7/02 >> Rt IJ HD cath 7/02 >>   DISCUSSION: 52 yo male with septic shock, AKI, hyperkalemia, acidosis with MRSA in blood and C diff recurrence.  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  Reports using cocaine and IV heroin within past one month.  ASSESSMENT / PLAN:  Acute hypoxic respiratory failure. Pleural effusions. - full vent support - decrease RR to 16 - adjust PEEP, FiO2 to keep SpO2 > 92% - f/u CXR, ABG  Septic shock with MRSA in blood, and C diff Ag/PCR positive with hx of recurrent C diff. - pressors to keep MAP > 65 - day 3 of IV vancomycin, day 2 of enteral vancomycin - ID following  Acute on chronic diastolic CHF. Mitral valve disease >> not a surgical candidate for valve replacement. Elevated troponin from  demand ischemia. SVT from levophed. - continue dobutamine - f/u echo - cardiology following  Hyponatremia, hyperkalemia. AKI with ATN. CKD 3. Metabolic acidosis with lactic acidosis. - CRRT start 7/02 - f/u BMET, ABG - might be able to d/c HCO3 in dialysate 7/03 now that he is alkalotic >> defer to renal  Thrombocytopenia. - f/u CBC  Digital ischemia. - monitor clinically  Hypoglycemia. - dextrose in IV fluids - start tube feeds  DVT prophylaxis - SCDs SUP - pepcid Nutrition - tube feeds Goals of care - full code  CC time 36 minutes  Coralyn HellingVineet Keryl Gholson, MD [facility] Pulmonary/Critical Care 12/03/2017, 8:59 AM

## 2017-12-03 NOTE — Progress Notes (Addendum)
Initial Nutrition Assessment  DOCUMENTATION CODES:   Not applicable  INTERVENTION:   Vital High Protein @ 60 ml/hr (1440 ml) 30 ml Prostat once/day  Provides: 1540 kcals, 141 grams protein, 1204 ml free water. Meets 100% of needs.   NUTRITION DIAGNOSIS:   Increased nutrient needs related to acute illness(AKI requiring CRRT) as evidenced by estimated needs.  GOAL:   Patient will meet greater than or equal to 90% of their needs  MONITOR:   PO intake, Supplement acceptance, Diet advancement, Weight trends, Labs  REASON FOR ASSESSMENT:   Consult Enteral/tube feeding initiation and management  ASSESSMENT:   Patient with PMH significant for mitral valve endocarditis with severe MR, CHF, Hep C, malnutrition, PE, and polysubstance abuse. Presents this admission with sepsis from unclear source with diffuse rash.  Pt scheduled for MVR 12/09/17, not a surgical candidate at this time due to ongoing drug use. Palliative consulted.   7/2- pt intubated, began CRRT  Pt requiring pressor support x2.  Undergoing CRRT upon visit.  No family at bedside to provide nutrition information.  Plan is to trial intubation and dialysis in hopes he will stabilize from infections per family request.  A limited Nutrition-Focused physical exam was completed. Suspect fluid accumulation is masking losses.   Patient is currently intubated on ventilator support MV: 11.3 L/min Temp (24hrs), Avg:96 F (35.6 C), Min:93.8 F (34.3 C), Max:97.5 F (36.4 C) BP: 79/57 MAP: 83  I/O: + 6882 ml since admit UOP: 125 ml x 24 hr Fluid removal: 3.1 L yesterday  Medications reviewed and include: D5-NS @ 30 ml/hr Labs reviewed: Na 133 (L)  Cl 93 (L) corrected calcium 8.0 (L) ionized calcium 0.88 (L)  NUTRITION - FOCUSED PHYSICAL EXAM:    Most Recent Value  Orbital Region  No depletion  Upper Arm Region  Unable to assess  Thoracic and Lumbar Region  Unable to assess  Buccal Region  Unable to assess  Temple  Region  Moderate depletion  Clavicle Bone Region  Mild depletion  Clavicle and Acromion Bone Region  No depletion  Dorsal Hand  No depletion  Patellar Region  Unable to assess  Anterior Thigh Region  Unable to assess  Posterior Calf Region  Unable to assess     Diet Order:   Diet Order           Diet NPO time specified  Diet effective now          EDUCATION NEEDS:   Not appropriate for education at this time  Skin:  Skin Assessment: Skin Integrity Issues: Skin Integrity Issues:: Other (Comment) Other: open wound left foot  Last BM:  12/02/17  Height:   Ht Readings from Last 1 Encounters:  12/02/17 5\' 9"  (1.753 m)    Weight:   Wt Readings from Last 1 Encounters:  12/03/17 178 lb 2.1 oz (80.8 kg)    Ideal Body Weight:  72.7 kg  BMI:  Body mass index is 26.31 kg/m.  Estimated Nutritional Needs:   Kcal:  1533 kcal   Protein:  135-150 g   Fluid:  Per MD     Vanessa Kickarly Ryota Treece RD, LDN Clinical Nutrition Pager # [phone]

## 2017-12-03 NOTE — Progress Notes (Signed)
eLink Physician-Brief Progress Note Patient Name: Kern ReapBryon T Walmsley DOB: 09/10/1965 MRN: [id number]   Date of Service  12/03/2017  HPI/Events of Note  ABG on 40%/PRVC 22/TV 570/P 5 = 7.55/31.5/113.0. NaHCO3 in CRRT fluid.   eICU Interventions  Will order: 1. Decrease PRVC rate to 18. 2. Repeat ABG at 7:15 AM. 3. Will ask nurse to speak with renal about CRRT fluid.      Intervention Category Major Interventions: Acid-Base disturbance - evaluation and management;Respiratory failure - evaluation and management  Sommer,Steven Eugene 12/03/2017, 5:25 AM

## 2017-12-03 NOTE — Progress Notes (Signed)
S: intubated 12/02/17 O:BP 105/82   Pulse 77   Temp (!) 96.9 F (36.1 C) (Axillary)   Resp 17   Ht '5\' 9"'$  (1.753 m)   Wt 80.8 kg (178 lb 2.1 oz)   SpO2 100%   BMI 26.31 kg/m   Intake/Output Summary (Last 24 hours) at 12/03/2017 1240 Last data filed at 12/03/2017 1200 Gross per 24 hour  Intake 5954.4 ml  Output 4671 ml  Net 1283.4 ml   Intake/Output: I/O last 3 completed shifts: In: 8424.9 [I.V.:7811.6; NG/GT:140; IV Piggyback:473.3] Out: 9323 [Urine:125; Other:3119]  Intake/Output this shift:  Total I/O In: 1126.9 [I.V.:1110.9; NG/GT:16] Out: 1427 [Other:1427] Weight change: 4.142 kg (9 lb 2.1 oz) FTD:[id number] and sedated CVS: II/VI Systolic murmer Resp: scattered rhonchi Abd:+BS, soft Ext: 2+ edema with ischemic changes to hands and feet  Recent Labs  Lab 12/08/2017 1020 12/02/17 0335 12/02/17 1650 12/03/17 0417 12/03/17 0423 12/03/17 1023  NA 129* 129* 134* 134* 133*  --   K 5.5* 5.6* 5.7* 4.8 4.3  --   CL 93* 99 103 93*  --   --   CO2 21* 13* 14* 24  --   --   GLUCOSE 116* 66* 50* 125*  --   --   BUN 37* 47* 51* 39*  --   --   CREATININE 2.31* 2.50* 2.52* 1.91*  --   --   ALBUMIN 2.7*  --  2.3* 2.1*  --   --   CALCIUM 8.6* 7.7* 7.2* 6.5*  --   --   PHOS  --  5.2* 6.3* 3.8  --  2.9  AST 113*  --   --   --   --   --   ALT 157*  --   --   --   --   --    Liver Function Tests: Recent Labs  Lab 12/23/2017 1020 12/02/17 1650 12/03/17 0417  AST 113*  --   --   ALT 157*  --   --   ALKPHOS 102  --   --   BILITOT 2.8*  --   --   PROT 6.9  --   --   ALBUMIN 2.7* 2.3* 2.1*   No results for input(s): LIPASE, AMYLASE in the last 168 hours. Recent Labs  Lab 12/06/2017 1020  AMMONIA 28   CBC: Recent Labs  Lab 12/20/2017 1020 12/02/17 0335 12/03/17 0417 12/03/17 0423  WBC 5.4 14.2* 13.7*  --   NEUTROABS 4.5  --   --   --   HGB 12.6* 11.5* 11.0* 12.9*  HCT 42.2 38.0* 35.1* 38.0*  MCV 78.4 76.8* 74.8*  --   PLT 112* 101* 68*  --    Cardiac Enzymes: Recent  Labs  Lab 12/22/2017 1020 12/28/2017 1545 12/07/2017 2306  TROPONINI 1.07* 1.74* 2.77*   CBG: Recent Labs  Lab 12/02/17 2112 12/02/17 2305 12/03/17 0354 12/03/17 0816 12/03/17 1207  GLUCAP 96 84 109* 118* 118*    Iron Studies: No results for input(s): IRON, TIBC, TRANSFERRIN, FERRITIN in the last 72 hours. Studies/Results: Dg Abd 1 View  Result Date: 12/02/2017 CLINICAL DATA:  52 year old male with nasogastric tube placement. Subsequent encounter. EXAM: ABDOMEN - 1 VIEW COMPARISON:  12/02/2017 2:28 p.m. FINDINGS: Nasogastric tube tip gastric antrum level with side hole proximal gastric body level. Paucity of bowel gas. Postsurgical changes left acetabulum with left hip joint degenerative changes. IMPRESSION: Nasogastric tube tip gastric antrum level with side hole proximal gastric body level. Electronically Signed  By: Genia Del M.D.   On: 12/02/2017 20:04   Dg Chest Port 1 View  Result Date: 12/03/2017 CLINICAL DATA:  Shortness of breath, pleural effusion. EXAM: PORTABLE CHEST 1 VIEW COMPARISON:  Radiograph December 02, 2017. FINDINGS: Stable cardiomegaly. Endotracheal and nasogastric tubes are unchanged in position. Bilateral internal jugular catheters are unchanged in position. No pneumothorax is noted. Stable bibasilar opacities are noted concerning for atelectasis or edema with associated pleural effusions. Bony thorax is unremarkable. IMPRESSION: Stable support apparatus. Stable bilateral lung opacities as described above. Electronically Signed   By: Marijo Conception, M.D.   On: 12/03/2017 07:22   Dg Chest Port 1 View  Result Date: 12/02/2017 CLINICAL DATA:  Central line placement. EXAM: PORTABLE CHEST 1 VIEW COMPARISON:  12/02/2017 and prior radiographs FINDINGS: A RIGHT IJ central venous catheter is noted with tip overlying the mid SVC. No pneumothorax. An endotracheal tube with tip 3 cm above the carina, LEFT IJ central venous catheter with tip overlying the LOWER SVC, and NG tube tip  overlying the LOWER esophagus/EG junction again noted. Bilateral pleural effusions and bilateral LOWER lung atelectasis again noted. IMPRESSION: RIGHT IJ central venous catheter with tip overlying the mid SVC. No pneumothorax. Otherwise unchanged appearance of the chest with bilateral pleural effusions and bilateral LOWER lung atelectasis. Electronically Signed   By: Margarette Canada M.D.   On: 12/02/2017 17:32   Dg Chest Port 1 View  Result Date: 12/02/2017 CLINICAL DATA:  Check endotracheal tube placement EXAM: PORTABLE CHEST 1 VIEW COMPARISON:  12/02/2017 FINDINGS: Left jugular central line is again seen. Endotracheal tube and nasogastric catheter are noted. The nasogastric catheter is short of its expected location within the stomach. Bilateral pleural effusions are again noted and stable. Cardiomegaly is seen and stable. IMPRESSION: Endotracheal tube and nasogastric catheter as described. The remainder of the chest is stable from the prior exam. Electronically Signed   By: Inez Catalina M.D.   On: 12/02/2017 14:48   Dg Chest Port 1 View  Result Date: 12/02/2017 CLINICAL DATA:  Attempted right internal jugular central venous catheter placement. EXAM: PORTABLE CHEST 1 VIEW COMPARISON:  12/02/2017 at 0435 hours FINDINGS: A left jugular catheter terminates over the lower SVC, unchanged. The cardiomediastinal silhouette is unchanged with cardiac enlargement again noted. Bilateral pleural effusions and bibasilar parenchymal lung opacities suggesting atelectasis are unchanged. No pneumothorax is identified. IMPRESSION: 1. No pneumothorax following attempted central line placement. 2. Unchanged bilateral pleural effusions and basilar atelectasis. Electronically Signed   By: Logan Bores M.D.   On: 12/02/2017 13:28   Dg Chest Port 1 View  Result Date: 12/02/2017 CLINICAL DATA:  Follow-up pleural effusions EXAM: PORTABLE CHEST 1 VIEW COMPARISON:  12/08/2017 FINDINGS: Left internal jugular central line is unchanged  with its tip in the SVC above the right atrium. Enlarged cardiac silhouette again demonstrated. Bilateral pleural effusions persist, similar to yesterday, with atelectasis in both lower lobes. Upper lungs remain well aerated. No qualitatively new finding. IMPRESSION: Persistent bilateral effusions and lower lobe atelectasis, not significantly changed since yesterday allowing technical differences. Electronically Signed   By: Nelson Chimes M.D.   On: 12/02/2017 06:53   Dg Chest Portable 1 View  Result Date: 12/14/2017 CLINICAL DATA:  Evaluate for central line placement. EXAM: PORTABLE CHEST 1 VIEW COMPARISON:  Portable film at 1025 hours. FINDINGS: Unchanged cardiomediastinal silhouette and aeration. LEFT IJ catheter has been inserted, and lies with its tip at the cavoatrial junction. There is no pneumothorax. IMPRESSION: LEFT IJ catheter tip  at cavoatrial junction. No pneumothorax. Stable aeration. Electronically Signed   By: Staci Righter M.D.   On: 12/22/2017 19:22   Dg Abd Portable 1v  Result Date: 12/02/2017 CLINICAL DATA:  Check nasogastric catheter placement EXAM: PORTABLE ABDOMEN - 1 VIEW COMPARISON:  None. FINDINGS: Scattered large and small bowel gas is noted. There is a nasogastric catheter identified although the catheter is short of the stomach with both the proximal side port and tip lying in the distal esophagus. This should be advanced several cm. IMPRESSION: Nasogastric catheter in the distal esophagus. Electronically Signed   By: Inez Catalina M.D.   On: 12/02/2017 14:52   . aspirin  81 mg Per Tube Daily  . chlorhexidine gluconate (MEDLINE KIT)  15 mL Mouth Rinse BID  . Chlorhexidine Gluconate Cloth  6 each Topical Daily  . famotidine  20 mg Per Tube Q12H  . [START ON 12/04/2017] feeding supplement (PRO-STAT SUGAR FREE 64)  30 mL Per Tube Daily  . mouth rinse  15 mL Mouth Rinse 10 times per day  . vancomycin  500 mg Per Tube Q6H    BMET    Component Value Date/Time   NA 133 (L)  12/03/2017 0423   NA 138 09/29/2017 1350   K 4.3 12/03/2017 0423   CL 93 (L) 12/03/2017 0417   CO2 24 12/03/2017 0417   GLUCOSE 125 (H) 12/03/2017 0417   BUN 39 (H) 12/03/2017 0417   BUN 21 09/29/2017 1350   CREATININE 1.91 (H) 12/03/2017 0417   CALCIUM 6.5 (L) 12/03/2017 0417   GFRNONAA 39 (L) 12/03/2017 0417   GFRAA 45 (L) 12/03/2017 0417   CBC    Component Value Date/Time   WBC 13.7 (H) 12/03/2017 0417   RBC 4.69 12/03/2017 0417   HGB 12.9 (L) 12/03/2017 0423   HGB 10.4 (L) 09/29/2017 1350   HCT 38.0 (L) 12/03/2017 0423   HCT 33.4 (L) 09/29/2017 1350   PLT 68 (L) 12/03/2017 0417   PLT 305 09/29/2017 1350   MCV 74.8 (L) 12/03/2017 0417   MCV 86 09/29/2017 1350   MCH 23.5 (L) 12/03/2017 0417   MCHC 31.3 12/03/2017 0417   RDW 21.7 (H) 12/03/2017 0417   RDW 16.1 (H) 09/29/2017 1350   LYMPHSABS 0.4 (L) 12/08/2017 1020   LYMPHSABS 1.6 09/09/2017 1019   MONOABS 0.2 12/14/2017 1020   EOSABS 0.2 12/19/2017 1020   EOSABS 0.2 09/09/2017 1019   BASOSABS 0.1 12/09/2017 1020   BASOSABS 0.1 09/09/2017 1019    Assessment/Plan: 1.  Oliguric ARF in setting of MRSA bacteremia/sepsis- remains anuric and pressor dependent.  CVVHD started 12/02/17 and continues to have low BP's despite max neo and also on vasopressin and dobutamine.   1. CT surgery has turned him down for MVR so I agree with transitioning him to comfort measures and stopping CVVHD as this is medically futile 2. Metabolic acidosis due to #1- resolved with CVVHD.  will change to 4K/2.5 Ca prismasate as replacement fluids and stop isotonic bicarb. 3. Hyperkalemia- due to #1 and #2.  As above 4. MRSA bacteremia/sepsis- severe requiring high dose pressors.  Vancomycin per ID 5. 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 Guy Sandifer assistance and agree that he is not a candidate for repair 6. Hyponatremia -due to #1, will follow after CVVHD 7. Respiratory distress- has pleural effusions on CXR and will UF with  CVVHD. 8. Thrombocytopenia 9. Acute on chronic diastolic CHF with mitral valve disease- on dobutamine per PCCM.  Cardiology consulted  10. Disposition- poor overall prognosis.  Recommend transitioning to comfort measures.   Donetta Potts, MD [employer] [phone]

## 2017-12-03 NOTE — Progress Notes (Signed)
Met with pt's parents.  Updated about status and clinical findings.  They would like to hear report from echo.  They are leaning more toward transitioning to comfort measures.  I advised them that they should inform the rest of their family about Mr. Fonder's current condition.    Chesley Mires, MD [facility] Pulmonary/Critical Care 12/03/2017, 12:22 PM

## 2017-12-03 NOTE — Progress Notes (Signed)
CRITICAL VALUE ALERT  Critical Value:  Lactic Acid 2.8  Date & Time Notied:  12/03/2017 1450  Provider Notified: Dr. Craige CottaSood  Orders Received/Actions taken: No new orders

## 2017-12-03 NOTE — Progress Notes (Signed)
  Echocardiogram 2D Echocardiogram has been performed.  Marcus [name], Marcus [name] R 12/03/2017, 10:00 AM

## 2017-12-03 NOTE — Progress Notes (Signed)
   Patient remains critically ill due to sepsis and seem to be headed toward comfort care. CCM and Nephrology following.   Little for us to add here. We will sign off. Please re-consult as needed.   Arvilla Meresaniel Bensimhon, MD  7:04 PM

## 2017-12-03 NOTE — Progress Notes (Signed)
Patient ID: Kern ReapBryon T Weitman, male   DOB: 10/14/1965, 52 y.o.   MRN: [id number]          [facility]    Date of Admission:  12/21/2017           Day 3 IV vancomycin  He has recurrent staph aureus bacteremia, now with MRSA and today's TTE shows worsening of his mitral valve vegetation and mitral regurgitation.  Repeat blood cultures are negative at 48 hours.  I plan on continuing vancomycin pending further discussion of goals of care.         Cliffton AstersJohn Kenyette Gundy, MD [facility] [phone] pager   [phone] cell 12/03/2017, 6:15 PM

## 2017-12-03 NOTE — Progress Notes (Signed)
eLink Physician-Brief Progress Note Patient Name: Marcus ReapBryon T [name] DOB: 06/24/1965 MRN: [id number]   Date of Service  12/03/2017  HPI/Events of Note  Current MAP of 58-59 mmHg remains below goal MAP of 60 mmHg despite max dose phenylephrine and vasopressin. Per PCCM rounding notes, pt's family likely to pursue comfort care  eICU Interventions  No further interventions added     Intervention Category Major Interventions: Shock - evaluation and management  Merwyn KatosDavid B Simonds 12/03/2017, 11:56 PM

## 2017-12-04 ENCOUNTER — Inpatient Hospital Stay (HOSPITAL_COMMUNITY): Payer: Medicaid Other

## 2017-12-04 DIAGNOSIS — J9601 Acute respiratory failure with hypoxia: Secondary | ICD-10-CM

## 2017-12-04 LAB — POCT ACTIVATED CLOTTING TIME
ACTIVATED CLOTTING TIME: 180 s
ACTIVATED CLOTTING TIME: 186 s
ACTIVATED CLOTTING TIME: 191 s
ACTIVATED CLOTTING TIME: 197 s
Activated Clotting Time: 191 seconds
Activated Clotting Time: 191 seconds
Activated Clotting Time: 191 seconds
Activated Clotting Time: 230 seconds
Activated Clotting Time: 197 seconds

## 2017-12-04 LAB — GLUCOSE, CAPILLARY
GLUCOSE-CAPILLARY: 140 mg/dL — AB (ref 70–99)
Glucose-Capillary: 148 mg/dL — ABNORMAL HIGH (ref 70–99)
Glucose-Capillary: 132 mg/dL — ABNORMAL HIGH (ref 70–99)
Glucose-Capillary: 148 mg/dL — ABNORMAL HIGH (ref 70–99)
Glucose-Capillary: 112 mg/dL — ABNORMAL HIGH (ref 70–99)

## 2017-12-04 LAB — POCT I-STAT 3, ART BLOOD GAS (G3+)
ACID-BASE EXCESS: 7 mmol/L — AB (ref 0.0–2.0)
Bicarbonate: 30.2 mmol/L — ABNORMAL HIGH (ref 20.0–28.0)
Bicarbonate: 23 mmol/L (ref 20.0–28.0)
O2 Saturation: 100 %
O2 Saturation: 97 %
PH ART: 7.492 — AB (ref 7.350–7.450)
PO2 ART: 82 mmHg — AB (ref 83.0–108.0)
Patient temperature: 98.2
TCO2: 31 mmol/L (ref 22–32)
TCO2: 24 mmol/L (ref 22–32)
pCO2 arterial: 35.3 mmHg (ref 32.0–48.0)
pCO2 arterial: 29.7 mmHg — ABNORMAL LOW (ref 32.0–48.0)
pH, Arterial: 7.54 — ABNORMAL HIGH (ref 7.350–7.450)
pO2, Arterial: 146 mmHg — ABNORMAL HIGH (ref 83.0–108.0)

## 2017-12-04 LAB — RENAL FUNCTION PANEL
Albumin: 2.2 g/dL — ABNORMAL LOW (ref 3.5–5.0)
Anion gap: 11 (ref 5–15)
BUN: 24 mg/dL — ABNORMAL HIGH (ref 6–20)
CALCIUM: 6.9 mg/dL — AB (ref 8.9–10.3)
CO2: 23 mmol/L (ref 22–32)
CREATININE: 1.14 mg/dL (ref 0.61–1.24)
Chloride: 102 mmol/L (ref 98–111)
GFR calc Af Amer: >60 mL/min (ref >60)
GFR calc non Af Amer: >60 mL/min (ref >60)
Glucose, Bld: 162 mg/dL — ABNORMAL HIGH (ref 70–99)
Phosphorus: 2.2 mg/dL — ABNORMAL LOW (ref 2.5–4.6)
Potassium: 3.8 mmol/L (ref 3.5–5.1)
SODIUM: 136 mmol/L (ref 135–145)

## 2017-12-04 LAB — CBC
HEMATOCRIT: 34.3 % — AB (ref 39.0–52.0)
HEMOGLOBIN: 10.5 g/dL — AB (ref 13.0–17.0)
MCH: 23.4 pg — ABNORMAL LOW (ref 26.0–34.0)
MCHC: 30.6 g/dL (ref 30.0–36.0)
MCV: 76.4 fL — ABNORMAL LOW (ref 78.0–100.0)
Platelets: 37 10*3/uL — ABNORMAL LOW (ref 150–400)
RBC: 4.49 MIL/uL (ref 4.22–5.81)
RDW: 22.1 % — ABNORMAL HIGH (ref 11.5–15.5)
WBC: 11.5 10*3/uL — AB (ref 4.0–10.5)

## 2017-12-04 LAB — MAGNESIUM
Magnesium: 2 mg/dL (ref 1.7–2.4)
Magnesium: 2 mg/dL (ref 1.7–2.4)

## 2017-12-04 LAB — PHOSPHORUS
PHOSPHORUS: 1.3 mg/dL — AB (ref 2.5–4.6)
Phosphorus: 2 mg/dL — ABNORMAL LOW (ref 2.5–4.6)

## 2017-12-04 LAB — APTT: APTT: 79 s — AB (ref 24–36)

## 2017-12-04 MED ORDER — DEXTROSE 5 % IV SOLN
30.0000 mmol | Freq: Once | INTRAVENOUS | Status: AC
  Administered 2017-12-04: 30 mmol via INTRAVENOUS
  Filled 2017-12-04: qty 10

## 2017-12-04 NOTE — Progress Notes (Signed)
S: TEE revealed worsening MV vegetation O:BP 103/87   Pulse 87   Temp 98.7 F (37.1 C) (Oral)   Resp (!) 22   Ht '5\' 9"'$  (1.753 m)   Wt 81.2 kg (179 lb 0.2 oz)   SpO2 98%   BMI 26.44 kg/m   Intake/Output Summary (Last 24 hours) at 12/04/2017 1057 Last data filed at 12/04/2017 1000 Gross per 24 hour  Intake 6453.66 ml  Output 7958 ml  Net -1504.34 ml   Intake/Output: I/O last 3 completed shifts: In: 9176.8 [I.V.:7641.5; NG/GT:1335.3; IV Piggyback:200] Out: 11047 [Urine:180; ZOXWR:60454]  Intake/Output this shift:  Total I/O In: 800.1 [I.V.:560.1; NG/GT:240] Out: 972 [Urine:30; Other:942] Weight change: 0.4 kg (14.1 oz) Gen: critically ill, intubated and sedated CVS: no rub Resp: occ rhonchi Abd: benign Ext: 1+ edema and also with embolic/ischemic changes to toes R>L and mottled appearance of legs and upper ext   Recent Labs  Lab 12/31/2017 1020 12/02/17 0335 12/02/17 1650 12/03/17 0417 12/03/17 0423 12/03/17 1023 12/03/17 1632 12/03/17 1708 12/04/17 0416  NA 129* 129* 134* 134* 133*  --  138 135  --   K 5.5* 5.6* 5.7* 4.8 4.3  --  3.8 3.7  --   CL 93* 99 103 93*  --   --   --  94*  --   CO2 21* 13* 14* 24  --   --   --  32  --   GLUCOSE 116* 66* 50* 125*  --   --   --  171*  --   BUN 37* 47* 51* 39*  --   --   --  33*  --   CREATININE 2.31* 2.50* 2.52* 1.91*  --   --   --  1.64*  --   ALBUMIN 2.7*  --  2.3* 2.1*  --   --   --  2.0*  --   CALCIUM 8.6* 7.7* 7.2* 6.5*  --   --   --  6.3*  --   PHOS  --  5.2* 6.3* 3.8  --  2.9  --  2.3*  2.3* 1.3*  AST 113*  --   --   --   --   --   --   --   --   ALT 157*  --   --   --   --   --   --   --   --    Liver Function Tests: Recent Labs  Lab 12/20/2017 1020 12/02/17 1650 12/03/17 0417 12/03/17 1708  AST 113*  --   --   --   ALT 157*  --   --   --   ALKPHOS 102  --   --   --   BILITOT 2.8*  --   --   --   PROT 6.9  --   --   --   ALBUMIN 2.7* 2.3* 2.1* 2.0*   No results for input(s): LIPASE, AMYLASE in the last 168  hours. Recent Labs  Lab 12/03/2017 1020  AMMONIA 28   CBC: Recent Labs  Lab 12/06/2017 1020 12/02/17 0335 12/03/17 0417 12/03/17 0423 12/03/17 1632 12/04/17 0416  WBC 5.4 14.2* 13.7*  --   --  11.5*  NEUTROABS 4.5  --   --   --   --   --   HGB 12.6* 11.5* 11.0* 12.9* 13.9 10.5*  HCT 42.2 38.0* 35.1* 38.0* 41.0 34.3*  MCV 78.4 76.8* 74.8*  --   --  76.4*  PLT 112* 101* 68*  --   --  37*   Cardiac Enzymes: Recent Labs  Lab 12/24/2017 1020 12/18/2017 1545 12/31/2017 2306  TROPONINI 1.07* 1.74* 2.77*   CBG: Recent Labs  Lab 12/03/17 1639 12/03/17 1937 12/03/17 2305 12/04/17 0402 12/04/17 0810  GLUCAP 140* 154* 132* 148* 132*    Iron Studies: No results for input(s): IRON, TIBC, TRANSFERRIN, FERRITIN in the last 72 hours. Studies/Results: Dg Abd 1 View  Result Date: 12/02/2017 CLINICAL DATA:  52 year old male with nasogastric tube placement. Subsequent encounter. EXAM: ABDOMEN - 1 VIEW COMPARISON:  12/02/2017 2:28 p.m. FINDINGS: Nasogastric tube tip gastric antrum level with side hole proximal gastric body level. Paucity of bowel gas. Postsurgical changes left acetabulum with left hip joint degenerative changes. IMPRESSION: Nasogastric tube tip gastric antrum level with side hole proximal gastric body level. Electronically Signed   By: Genia Del M.D.   On: 12/02/2017 20:04   Dg Chest Port 1 View  Result Date: 12/04/2017 CLINICAL DATA:  Respiratory failure EXAM: PORTABLE CHEST 1 VIEW COMPARISON:  12/03/2017 FINDINGS: Cardiac shadow is again enlarged. Left jugular central line, right jugular temporary dialysis catheter, endotracheal tube and nasogastric catheter are noted in satisfactory position. Bilateral pleural effusions are again seen and stable. Bibasilar atelectasis is likely present as well. IMPRESSION: No change in bibasilar disease. Tubes and lines as described. Electronically Signed   By: Inez Catalina M.D.   On: 12/04/2017 06:58   Dg Chest Port 1 View  Result Date:  12/03/2017 CLINICAL DATA:  Shortness of breath, pleural effusion. EXAM: PORTABLE CHEST 1 VIEW COMPARISON:  Radiograph December 02, 2017. FINDINGS: Stable cardiomegaly. Endotracheal and nasogastric tubes are unchanged in position. Bilateral internal jugular catheters are unchanged in position. No pneumothorax is noted. Stable bibasilar opacities are noted concerning for atelectasis or edema with associated pleural effusions. Bony thorax is unremarkable. IMPRESSION: Stable support apparatus. Stable bilateral lung opacities as described above. Electronically Signed   By: Marijo Conception, M.D.   On: 12/03/2017 07:22   Dg Chest Port 1 View  Result Date: 12/02/2017 CLINICAL DATA:  Central line placement. EXAM: PORTABLE CHEST 1 VIEW COMPARISON:  12/02/2017 and prior radiographs FINDINGS: A RIGHT IJ central venous catheter is noted with tip overlying the mid SVC. No pneumothorax. An endotracheal tube with tip 3 cm above the carina, LEFT IJ central venous catheter with tip overlying the LOWER SVC, and NG tube tip overlying the LOWER esophagus/EG junction again noted. Bilateral pleural effusions and bilateral LOWER lung atelectasis again noted. IMPRESSION: RIGHT IJ central venous catheter with tip overlying the mid SVC. No pneumothorax. Otherwise unchanged appearance of the chest with bilateral pleural effusions and bilateral LOWER lung atelectasis. Electronically Signed   By: Margarette Canada M.D.   On: 12/02/2017 17:32   Dg Chest Port 1 View  Result Date: 12/02/2017 CLINICAL DATA:  Check endotracheal tube placement EXAM: PORTABLE CHEST 1 VIEW COMPARISON:  12/02/2017 FINDINGS: Left jugular central line is again seen. Endotracheal tube and nasogastric catheter are noted. The nasogastric catheter is short of its expected location within the stomach. Bilateral pleural effusions are again noted and stable. Cardiomegaly is seen and stable. IMPRESSION: Endotracheal tube and nasogastric catheter as described. The remainder of the chest  is stable from the prior exam. Electronically Signed   By: Inez Catalina M.D.   On: 12/02/2017 14:48   Dg Chest Port 1 View  Result Date: 12/02/2017 CLINICAL DATA:  Attempted right internal jugular central venous catheter placement. EXAM: PORTABLE CHEST  1 VIEW COMPARISON:  12/02/2017 at 0435 hours FINDINGS: A left jugular catheter terminates over the lower SVC, unchanged. The cardiomediastinal silhouette is unchanged with cardiac enlargement again noted. Bilateral pleural effusions and bibasilar parenchymal lung opacities suggesting atelectasis are unchanged. No pneumothorax is identified. IMPRESSION: 1. No pneumothorax following attempted central line placement. 2. Unchanged bilateral pleural effusions and basilar atelectasis. Electronically Signed   By: Logan Bores M.D.   On: 12/02/2017 13:28   Dg Abd Portable 1v  Result Date: 12/02/2017 CLINICAL DATA:  Check nasogastric catheter placement EXAM: PORTABLE ABDOMEN - 1 VIEW COMPARISON:  None. FINDINGS: Scattered large and small bowel gas is noted. There is a nasogastric catheter identified although the catheter is short of the stomach with both the proximal side port and tip lying in the distal esophagus. This should be advanced several cm. IMPRESSION: Nasogastric catheter in the distal esophagus. Electronically Signed   By: Inez Catalina M.D.   On: 12/02/2017 14:52   . aspirin  81 mg Per Tube Daily  . chlorhexidine gluconate (MEDLINE KIT)  15 mL Mouth Rinse BID  . Chlorhexidine Gluconate Cloth  6 each Topical Daily  . famotidine  20 mg Per Tube Q12H  . feeding supplement (PRO-STAT SUGAR FREE 64)  30 mL Per Tube Daily  . mouth rinse  15 mL Mouth Rinse 10 times per day  . vancomycin  500 mg Per Tube Q6H    BMET    Component Value Date/Time   NA 135 12/03/2017 1708   NA 138 09/29/2017 1350   K 3.7 12/03/2017 1708   CL 94 (L) 12/03/2017 1708   CO2 32 12/03/2017 1708   GLUCOSE 171 (H) 12/03/2017 1708   BUN 33 (H) 12/03/2017 1708   BUN 21  09/29/2017 1350   CREATININE 1.64 (H) 12/03/2017 1708   CALCIUM 6.3 (LL) 12/03/2017 1708   GFRNONAA 47 (L) 12/03/2017 1708   GFRAA 54 (L) 12/03/2017 1708   CBC    Component Value Date/Time   WBC 11.5 (H) 12/04/2017 0416   RBC 4.49 12/04/2017 0416   HGB 10.5 (L) 12/04/2017 0416   HGB 10.4 (L) 09/29/2017 1350   HCT 34.3 (L) 12/04/2017 0416   HCT 33.4 (L) 09/29/2017 1350   PLT 37 (L) 12/04/2017 0416   PLT 305 09/29/2017 1350   MCV 76.4 (L) 12/04/2017 0416   MCV 86 09/29/2017 1350   MCH 23.4 (L) 12/04/2017 0416   MCHC 30.6 12/04/2017 0416   RDW 22.1 (H) 12/04/2017 0416   RDW 16.1 (H) 09/29/2017 1350   LYMPHSABS 0.4 (L) 12/06/2017 1020   LYMPHSABS 1.6 09/09/2017 1019   MONOABS 0.2 12/16/2017 1020   EOSABS 0.2 12/15/2017 1020   EOSABS 0.2 09/09/2017 1019   BASOSABS 0.1 12/11/2017 1020   BASOSABS 0.1 09/09/2017 1019    Assessment/Plan: 1. Oliguric ARF in setting of MRSA bacteremia/sepsis- remains anuric and pressor dependent.  CVVHD started 12/02/17 and continues to have low BP's despite max neo and also on vasopressin and dobutamine.  1. CT surgery has turned him down for MVR so I agree with transitioning him to comfort measures and stopping CVVHD as this is medically futile 2. Metabolic acidosis due to #1- resolved with CVVHD. will change to 4K/2.5 Ca prismasate as replacement fluids and stop isotonic bicarb. 3. Hyperkalemia- due to #1 and #2. As above 4. MRSA bacteremia/sepsis- severe requiring high dose pressors and TEE with worsening mitral valve vegetation. Vancomycin per ID 5. 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 Guy Sandifer  assistance and agree that he is not a candidate for repair.  TEE with evidence of worsening MV vegetation and agree with transitioning to comfort measures.  Awaiting family meeting.  6. Hyponatremia -due to #1, improved after CVVHD 7. Respiratory distress- has pleural effusions on CXR and will UF with  CVVHD. 8. Thrombocytopenia 9. Ischemic/embolic changes to extremities- darker on right foot/toes and more mottled over legs, poor prognosis. 10. Hypophosphatemia- repleted per PCCM 11. Hypocalcemia- on 2.5 Ca bath and correct for alb of 2 is 7.9.  Cont to follow.  12. Acute on chronic diastolic CHF with mitral valve disease- on dobutamine per PCCM. Cardiology consulted 13. Disposition- poor overall prognosis. Recommend transitioning to comfort measures.    Donetta Potts, MD [employer] [phone]

## 2017-12-04 NOTE — Progress Notes (Signed)
CSW consulted to assist with figuring out who is supposed to be making decisions.  Attempted to call pt son who would be decision maker as next of kin- went straight to voicemail.  Spoke with pt father who is in room with pt mom- son at an event today and isn't available- they are unsure if he wants to be involved in decisions but would be able to ask him tomorrow  If cannot be reached patient parents would be next in line for decision making  CSW will continue to follow and assist as needed  Burna SisJenna H. Jaela Yepez, LCSW Clinical Social Worker [phone] h

## 2017-12-04 NOTE — Progress Notes (Signed)
Unable to obtain O2 Sat. Multiple sites and probes tried by RT and 2 RNs. MD Aware. Orders for ABG.

## 2017-12-04 NOTE — Progress Notes (Signed)
PULMONARY / CRITICAL CARE MEDICINE   Name: Marcus [name] MRN: [id number] DOB: 04-Sep-1965    ADMISSION DATE:  December 18, 2017  REFERRING MD:  ED  CHIEF COMPLAINT: Weakness  HISTORY OF PRESENT ILLNESS:   52 yo male former smoker presented with fever, weakness, hypotension.  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  He was tx for C diff in March and April of 2019.  UDS positive for opiates, cocaine.  SUBJECTIVE:  No events overnight, unresponsive  VITAL SIGNS: BP 103/89   Pulse 84   Temp 98.7 F (37.1 C) (Oral)   Resp 18   Ht 5\' 9"  (1.753 m)   Wt 179 lb 0.2 oz (81.2 kg)   SpO2 100%   BMI 26.44 kg/m   INTAKE / OUTPUT: I/O last 3 completed shifts: In: 9176.8 [I.V.:7641.5; NG/GT:1335.3; IV Piggyback:200] Out: 86578 [Urine:180; Other:10867]  PHYSICAL EXAMINATION:  General - Acutely ill appearing male, sedate, NAD Eyes - PERRL, EOM-I ENT - Janina Mayo is in place Cardiac - RRR, 3/6 SEM Chest - Coarse BS diffusely Abd - Soft, NT, ND and +BS Ext - 2+ edema Skin - ischemic changes in hand and feet with Rt foot being worst Neuro - obtunded, doesn't follow commands  LABS:  BMET Recent Labs  Lab 12/02/17 1650 12/03/17 0417 12/03/17 0423 12/03/17 1632 12/03/17 1708  NA 134* 134* 133* 138 135  K 5.7* 4.8 4.3 3.8 3.7  CL 103 93*  --   --  94*  CO2 14* 24  --   --  32  BUN 51* 39*  --   --  33*  CREATININE 2.52* 1.91*  --   --  1.64*  GLUCOSE 50* 125*  --   --  171*   Electrolytes Recent Labs  Lab 12/02/17 1650 12/03/17 0417 12/03/17 1023 12/03/17 1708 12/04/17 0416  CALCIUM 7.2* 6.5*  --  6.3*  --   MG  --  1.9 1.6* 1.7 2.0  PHOS 6.3* 3.8 2.9 2.3*  2.3* 1.3*   CBC Recent Labs  Lab 12/02/17 0335 12/03/17 0417 12/03/17 0423 12/03/17 1632 12/04/17 0416  WBC 14.2* 13.7*  --   --  11.5*  HGB 11.5* 11.0* 12.9* 13.9 10.5*  HCT 38.0* 35.1* 38.0* 41.0 34.3*  PLT 101* 68*  --   --  37*   Coag's Recent Labs  Lab  18-Dec-2017 1020 12/03/17 0417 12/04/17 0416  APTT  --  56* 79*  INR 1.72  --   --    Sepsis Markers Recent Labs  Lab 12/18/2017 1020  12/02/17 0634 12/02/17 0839 12/03/17 1321  LATICACIDVEN  --    < > 9.3* 10.1* 2.8*  PROCALCITON 23.04  --   --   --   --    < > = values in this interval not displayed.   ABG Recent Labs  Lab 12/03/17 0800 12/03/17 1621 12/04/17 0406  PHART 7.583* 7.593* 7.540*  PCO2ART 30.0* 37.0 35.3  PO2ART 106 131.0* 146.0*    Liver Enzymes Recent Labs  Lab 12-18-2017 1020 12/02/17 1650 12/03/17 0417 12/03/17 1708  AST 113*  --   --   --   ALT 157*  --   --   --   ALKPHOS 102  --   --   --   BILITOT 2.8*  --   --   --   ALBUMIN 2.7* 2.3* 2.1* 2.0*    Cardiac Enzymes Recent Labs  Lab 2017-12-18 1020  12/25/2017 1545 12/22/2017 2306  TROPONINI 1.07* 1.74* 2.77*    Glucose Recent Labs  Lab 12/03/17 1207 12/03/17 1639 12/03/17 1937 12/03/17 2305 12/04/17 0402 12/04/17 0810  GLUCAP 118* 140* 154* 132* 148* 132*    Imaging Dg Chest Port 1 View  Result Date: 12/04/2017 CLINICAL DATA:  Respiratory failure EXAM: PORTABLE CHEST 1 VIEW COMPARISON:  12/03/2017 FINDINGS: Cardiac shadow is again enlarged. Left jugular central line, right jugular temporary dialysis catheter, endotracheal tube and nasogastric catheter are noted in satisfactory position. Bilateral pleural effusions are again seen and stable. Bibasilar atelectasis is likely present as well. IMPRESSION: No change in bibasilar disease. Tubes and lines as described. Electronically Signed   By: Alcide CleverMark  Lukens M.D.   On: 12/04/2017 06:58     STUDIES:  Echo 7/03 >>   CULTURES: Blood 7/01 >> MRSA C diff PCR 7/01 >> Ag positive, toxin negative, PCR positive  ANTIBIOTICS: Zosyn 7/01 >> 7/02 IV Vancomycin 7/01 >> PO Vancomycin 7/02 >>  SIGNIFICANT EVENTS: 7/01 Admit, CHF team consulted 7/02 Renal and start CRRT, ID consulted  LINES/TUBES: Lt IJ CVL 7/01 >>  ETT 7/02 >> Rt IJ HD cath  7/02 >>   DISCUSSION: 52 yo male with septic shock, AKI, hyperkalemia, acidosis with MRSA in blood and C diff recurrence.  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  Reports using cocaine and IV heroin within past one month.  ASSESSMENT / PLAN:  Acute hypoxic respiratory failure. Pleural effusions. - Maintain on full vent support, no wean - Adjust vent for ABG, drop rate from 22 to 12 - Titrate O2 for sat of 88-92% - F/u CXR, ABG  Septic shock with MRSA in blood, and C diff Ag/PCR positive with hx of recurrent C diff. - Maintain pressor for MAP of 65 - Day 4 of IV vancomycin, day 3 of enteral vancomycin - ID following, appreciate input  Acute on chronic diastolic CHF. Mitral valve disease >> not a surgical candidate for valve replacement. Elevated troponin from demand ischemia. SVT from levophed. - Continue dobutamine - Echo with severe MR and preserved echo - Cardiology following, appreciate input, ?valve replacement but not while profoundly in septic shock  Hyponatremia, hyperkalemia. AKI with ATN. CKD 3. Metabolic acidosis with lactic acidosis. - CRRT start 7/02, negative 50-100 ml/hr - F/u BMET, ABG - D/C bicarb drip and D5W  Thrombocytopenia. - F/u CBC - Transfuse per ICU protocol  Digital ischemia. - Monitor clinically  Hypoglycemia. - D/C D4 - TF  DVT prophylaxis - SCDs SUP - pepcid Nutrition - tube feeds Goals of care - full code  Family considering comfort care, no family bedside  The patient is critically ill with multiple organ systems failure and requires high complexity decision making for assessment and support, frequent evaluation and titration of therapies, application of advanced monitoring technologies and extensive interpretation of multiple databases.   Critical Care Time devoted to patient care services described in this note is  31  Minutes. This time reflects time of care of this signee Dr Koren BoundWesam Cheyane Ayon. This critical care time does not reflect procedure  time, or teaching time or supervisory time of PA/NP/Med student/Med Resident etc but could involve care discussion time.  Alyson ReedyWesam G. Phallon Haydu, M.D. [facility]. Pager: [phone]. After hours pager: [phone].

## 2017-12-04 NOTE — Progress Notes (Signed)
Patient ID: Kern ReapBryon T Acri, male   DOB: 10/08/1965, 52 y.o.   MRN: [id number]          [facility]    Date of Admission:  12/22/2017   Total days of antibiotics 4         Doniel has severe MRSA bacteremia complicated by worsening mitral valve endocarditis and heart failure.  He is afebrile and repeat blood cultures are negative but he has an refractory shock with multiorgan failure.  His family is considering comfort measures only.  I will continue vancomycin for now.         Cliffton AstersJohn Jawara Latorre, MD [facility] [phone] pager   [phone] cell 12/04/2017, 1:48 PM

## 2017-12-05 ENCOUNTER — Inpatient Hospital Stay (HOSPITAL_COMMUNITY): Payer: Medicaid Other

## 2017-12-05 ENCOUNTER — Other Ambulatory Visit (HOSPITAL_COMMUNITY): Payer: Self-pay

## 2017-12-05 DIAGNOSIS — A4102 Sepsis due to Methicillin resistant Staphylococcus aureus: Principal | ICD-10-CM

## 2017-12-05 LAB — POCT I-STAT 3, ART BLOOD GAS (G3+)
ACID-BASE DEFICIT: 2 mmol/L (ref 0.0–2.0)
BICARBONATE: 21.8 mmol/L (ref 20.0–28.0)
O2 SAT: 97 %
PH ART: 7.424 (ref 7.350–7.450)
TCO2: 23 mmol/L (ref 22–32)
pCO2 arterial: 33.4 mmHg (ref 32.0–48.0)
pO2, Arterial: 85 mmHg (ref 83.0–108.0)

## 2017-12-05 LAB — POCT ACTIVATED CLOTTING TIME
ACTIVATED CLOTTING TIME: 158 s
ACTIVATED CLOTTING TIME: 175 s
ACTIVATED CLOTTING TIME: 208 s
ACTIVATED CLOTTING TIME: 202 s
ACTIVATED CLOTTING TIME: 208 s
Activated Clotting Time: 180 seconds
Activated Clotting Time: 186 seconds
Activated Clotting Time: 191 seconds
Activated Clotting Time: 191 seconds
Activated Clotting Time: 197 seconds
Activated Clotting Time: 202 seconds
Activated Clotting Time: 202 seconds
Activated Clotting Time: 202 seconds

## 2017-12-05 LAB — CBC
HCT: 37 % — ABNORMAL LOW (ref 39.0–52.0)
Hemoglobin: 11.4 g/dL — ABNORMAL LOW (ref 13.0–17.0)
MCH: 23.3 pg — AB (ref 26.0–34.0)
MCHC: 30.8 g/dL (ref 30.0–36.0)
MCV: 75.5 fL — AB (ref 78.0–100.0)
Platelets: 21 10*3/uL — CL (ref 150–400)
RBC: 4.9 MIL/uL (ref 4.22–5.81)
RDW: 22 % — AB (ref 11.5–15.5)
WBC: 13.7 10*3/uL — ABNORMAL HIGH (ref 4.0–10.5)

## 2017-12-05 LAB — RENAL FUNCTION PANEL
ALBUMIN: 2.3 g/dL — AB (ref 3.5–5.0)
ANION GAP: 10 (ref 5–15)
Albumin: 2.4 g/dL — ABNORMAL LOW (ref 3.5–5.0)
Anion gap: 12 (ref 5–15)
BUN: 23 mg/dL — AB (ref 6–20)
BUN: 25 mg/dL — ABNORMAL HIGH (ref 6–20)
CHLORIDE: 103 mmol/L (ref 98–111)
CHLORIDE: 105 mmol/L (ref 98–111)
CO2: 20 mmol/L — AB (ref 22–32)
CO2: 21 mmol/L — AB (ref 22–32)
Calcium: 7.4 mg/dL — ABNORMAL LOW (ref 8.9–10.3)
Calcium: 7.5 mg/dL — ABNORMAL LOW (ref 8.9–10.3)
Creatinine, Ser: 1.01 mg/dL (ref 0.61–1.24)
Creatinine, Ser: 0.94 mg/dL (ref 0.61–1.24)
GFR calc Af Amer: >60 mL/min (ref >60)
GFR calc Af Amer: >60 mL/min (ref >60)
GFR calc non Af Amer: >60 mL/min (ref >60)
GFR calc non Af Amer: >60 mL/min (ref >60)
GLUCOSE: 149 mg/dL — AB (ref 70–99)
Glucose, Bld: 121 mg/dL — ABNORMAL HIGH (ref 70–99)
PHOSPHORUS: 1.6 mg/dL — AB (ref 2.5–4.6)
POTASSIUM: 4.1 mmol/L (ref 3.5–5.1)
Phosphorus: 1.8 mg/dL — ABNORMAL LOW (ref 2.5–4.6)
Potassium: 4.1 mmol/L (ref 3.5–5.1)
SODIUM: 135 mmol/L (ref 135–145)
Sodium: 136 mmol/L (ref 135–145)

## 2017-12-05 LAB — MAGNESIUM: Magnesium: 2 mg/dL (ref 1.7–2.4)

## 2017-12-05 LAB — GLUCOSE, CAPILLARY
GLUCOSE-CAPILLARY: 114 mg/dL — AB (ref 70–99)
Glucose-Capillary: 115 mg/dL — ABNORMAL HIGH (ref 70–99)
Glucose-Capillary: 118 mg/dL — ABNORMAL HIGH (ref 70–99)
Glucose-Capillary: 118 mg/dL — ABNORMAL HIGH (ref 70–99)
Glucose-Capillary: 121 mg/dL — ABNORMAL HIGH (ref 70–99)
Glucose-Capillary: 115 mg/dL — ABNORMAL HIGH (ref 70–99)

## 2017-12-05 LAB — APTT: aPTT: 82 seconds — ABNORMAL HIGH (ref 24–36)

## 2017-12-05 NOTE — Progress Notes (Signed)
Patient ID: Marcus [name], male   DOB: 01/01/1966, 52 y.o.   MRN: [id number]         [facility]    Date of Admission:  12/08/2017   Total days of antibiotics 5         Marcus [name] has severe MRSA bacteremia complicated by worsening mitral valve endocarditis and heart failure.  He is afebrile and repeat blood cultures are negative.  Unfortunately, his prognosis for recovery is extremely poor.  His family would like to continue current care with IV and oral vancomycin.  Please call Dr. Judyann Munsonynthia Snider [phone] for any infectious disease questions this weekend.         Marcus AstersJohn Lacosta Hargan, MD [facility] [phone] pager   [phone] cell 12/05/2017, 1:16 PM

## 2017-12-05 NOTE — Progress Notes (Signed)
Met with pt's family.  Updated about current status.  They would like to continue current medical therapies for now, but no escalation of care.  DNR status if he develops cardiac arrest.  They will inform the rest of their family, and likely plan for withdrawal of support early next week.  Chesley Mires, MD [place] [facility]/Critical [facility] 12/05/2017, 1:00 PM

## 2017-12-05 NOTE — Progress Notes (Signed)
S: No improvement overnight O:BP 99/79   Pulse (!) 123   Temp (!) 96.8 F (36 C) (Oral)   Resp 18   Ht _0  (1.753 m)   Wt 75.6 kg (166 lb 10.7 oz)   SpO2 100%   BMI 24.61 kg/m   Intake/Output Summary (Last 24 hours) at 12/05/2017 1200 Last data filed at 12/05/2017 1100 Gross per 24 hour  Intake 5802.37 ml  Output 7313 ml  Net -1510.63 ml   Intake/Output: I/O last 3 completed shifts: In: 9567.8 [I.V.:6400.1; NG/GT:2480; IV Piggyback:687.6] Out: 46568 [Urine:145; LEXNT:70017]  Intake/Output this shift:  Total I/O In: 890.8 [I.V.:600.8; NG/GT:290] Out: 1086 [Other:1086] Weight change: -5.6 kg (-12 lb 5.5 oz) Gen: critically ill WM intubated and unresponsive CBS:WHQPR Resp: scattered rhonchi Abd: soft Ext: 2+ edema, ischemic changes to toes/feet R>L, abscess on dorsum of left foot, mottled appearance of skin  Recent Labs  Lab 12/02/2017 1020  12/02/17 0335 12/02/17 1650 12/03/17 0417 12/03/17 0423 12/03/17 1023 12/03/17 1632 12/03/17 1708 12/04/17 0416 12/04/17 1721 12/05/17 0400  NA 129*  --  129* 134* 134* 133*  --  138 135  --  136 135  K 5.5*  --  5.6* 5.7* 4.8 4.3  --  3.8 3.7  --  3.8 4.1  CL 93*  --  99 103 93*  --   --   --  94*  --  102 103  CO2 21*  --  13* 14* 24  --   --   --  32  --  23 20*  GLUCOSE 116*  --  66* 50* 125*  --   --   --  171*  --  162* 121*  BUN 37*  --  47* 51* 39*  --   --   --  33*  --  24* 23*  CREATININE 2.31*  --  2.50* 2.52* 1.91*  --   --   --  1.64*  --  1.14 1.01  ALBUMIN 2.7*  --   --  2.3* 2.1*  --   --   --  2.0*  --  2.2* 2.4*  CALCIUM 8.6*  --  7.7* 7.2* 6.5*  --   --   --  6.3*  --  6.9* 7.4*  PHOS  --    < > 5.2* 6.3* 3.8  --  2.9  --  2.3*  2.3* 1.3* 2.0*  2.2* 1.8*  AST 113*  --   --   --   --   --   --   --   --   --   --   --   ALT 157*  --   --   --   --   --   --   --   --   --   --   --    < > = values in this interval not displayed.   Liver Function Tests: Recent Labs  Lab 12/24/2017 1020  12/03/17 1708  12/04/17 1721 12/05/17 0400  AST 113*  --   --   --   --   ALT 157*  --   --   --   --   ALKPHOS 102  --   --   --   --   BILITOT 2.8*  --   --   --   --   PROT 6.9  --   --   --   --   ALBUMIN 2.7*   < >  2.0* 2.2* 2.4*   < > = values in this interval not displayed.   No results for input(s): LIPASE, AMYLASE in the last 168 hours. Recent Labs  Lab 12/15/2017 1020  AMMONIA 28   CBC: Recent Labs  Lab 12/08/2017 1020 12/02/17 0335 12/03/17 0417  12/03/17 1632 12/04/17 0416 12/05/17 0400  WBC 5.4 14.2* 13.7*  --   --  11.5* 13.7*  NEUTROABS 4.5  --   --   --   --   --   --   HGB 12.6* 11.5* 11.0*   < > 13.9 10.5* 11.4*  HCT 42.2 38.0* 35.1*   < > 41.0 34.3* 37.0*  MCV 78.4 76.8* 74.8*  --   --  76.4* 75.5*  PLT 112* 101* 68*  --   --  37* 21*   < > = values in this interval not displayed.   Cardiac Enzymes: Recent Labs  Lab 12/12/2017 1020 12/29/2017 1545 12/26/2017 2306  TROPONINI 1.07* 1.74* 2.77*   CBG: Recent Labs  Lab 12/04/17 1611 12/04/17 2016 12/04/17 2356 12/05/17 0359 12/05/17 0725  GLUCAP 148* 112* 118* 115* 118*    Iron Studies: No results for input(s): IRON, TIBC, TRANSFERRIN, FERRITIN in the last 72 hours. Studies/Results: Dg Chest Port 1 View  Result Date: 12/05/2017 CLINICAL DATA:  Intubation. EXAM: PORTABLE CHEST 1 VIEW COMPARISON:  12/04/2017. FINDINGS: Endotracheal tube tip 1.8 cm above the lower portion of the carina. 1-2 cm retraction should be considered. Bilateral IJ lines and NG tube in stable position. Cardiomegaly again noted. Bilateral pulmonary infiltrates/edema again noted. Slight worsening from prior exam. Bilateral pleural effusions again noted. No pneumothorax. IMPRESSION: 1. Endotracheal tube tip 1.8 cm above the lower portion of the carina. 1-2 cm retraction should be considered. Bilateral IJ lines and NG tube in stable position. 2. Cardiomegaly. Bilateral pulmonary infiltrates/edema and bilateral pleural effusions again noted. Slight  worsening from prior exam. Electronically Signed   By: Marcello Moores  Register   On: 12/05/2017 06:10   Dg Chest Port 1 View  Result Date: 12/04/2017 CLINICAL DATA:  Respiratory failure EXAM: PORTABLE CHEST 1 VIEW COMPARISON:  12/03/2017 FINDINGS: Cardiac shadow is again enlarged. Left jugular central line, right jugular temporary dialysis catheter, endotracheal tube and nasogastric catheter are noted in satisfactory position. Bilateral pleural effusions are again seen and stable. Bibasilar atelectasis is likely present as well. IMPRESSION: No change in bibasilar disease. Tubes and lines as described. Electronically Signed   By: Inez Catalina M.D.   On: 12/04/2017 06:58   . aspirin  81 mg Per Tube Daily  . chlorhexidine gluconate (MEDLINE KIT)  15 mL Mouth Rinse BID  . Chlorhexidine Gluconate Cloth  6 each Topical Daily  . famotidine  20 mg Per Tube Q12H  . feeding supplement (PRO-STAT SUGAR FREE 64)  30 mL Per Tube Daily  . mouth rinse  15 mL Mouth Rinse 10 times per day  . vancomycin  500 mg Per Tube Q6H    BMET    Component Value Date/Time   NA 135 12/05/2017 0400   NA 138 09/29/2017 1350   K 4.1 12/05/2017 0400   CL 103 12/05/2017 0400   CO2 20 (L) 12/05/2017 0400   GLUCOSE 121 (H) 12/05/2017 0400   BUN 23 (H) 12/05/2017 0400   BUN 21 09/29/2017 1350   CREATININE 1.01 12/05/2017 0400   CALCIUM 7.4 (L) 12/05/2017 0400   GFRNONAA >60 12/05/2017 0400   GFRAA >60 12/05/2017 0400   CBC    Component  Value Date/Time   WBC 13.7 (H) 12/05/2017 0400   RBC 4.90 12/05/2017 0400   HGB 11.4 (L) 12/05/2017 0400   HGB 10.4 (L) 09/29/2017 1350   HCT 37.0 (L) 12/05/2017 0400   HCT 33.4 (L) 09/29/2017 1350   PLT 21 (LL) 12/05/2017 0400   PLT 305 09/29/2017 1350   MCV 75.5 (L) 12/05/2017 0400   MCV 86 09/29/2017 1350   MCH 23.3 (L) 12/05/2017 0400   MCHC 30.8 12/05/2017 0400   RDW 22.0 (H) 12/05/2017 0400   RDW 16.1 (H) 09/29/2017 1350   LYMPHSABS 0.4 (L) 12/24/2017 1020   LYMPHSABS 1.6  09/09/2017 1019   MONOABS 0.2 12/14/2017 1020   EOSABS 0.2 12/06/2017 1020   EOSABS 0.2 09/09/2017 1019   BASOSABS 0.1 12/03/2017 1020   BASOSABS 0.1 09/09/2017 1019     Assessment/Plan: 1. Oliguric ARF in setting of MRSA bacteremia/sepsis-remains anuric and pressor dependent. CVVHD started 12/02/17 and continues to have low BP's despite max neo and also on vasopressin and dobutamine. 1. CT surgery has turned him down for MVR so I agree withtransitioning him to comfort measures and stopping CVVHDas this is medically futile 2. Still requiring pressors without any improvement 3. Recommend stopping CVVHD and transitioning to comfort care 2. Metabolic acidosis due to #2-[id number] withCVVHD.will change to 4K/2.5 Ca prismasate as replacement fluids (stopped isotonic bicarb 12/04/17). 3. Hyperkalemia- due to #1 and #2. As above 4. MRSA bacteremia/sepsis- severe requiring high dose pressors and TEE with worsening mitral valve vegetation. Vancomycin per ID 5. 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 Guy Sandifer assistance and agree that he is not a candidate for repair.  TEE with evidence of worsening MV vegetation and agree with transitioning to comfort measures.  Awaiting family meeting.  6. Hyponatremia -due to #1, improved after CVVHD 7. Respiratory distress- has pleural effusions on CXR and will UF with CVVHD. 8. Thrombocytopenia 9. Ischemic/embolic changes to extremities- darker on right foot/toes and more mottled over legs, poor prognosis. 10. Hypophosphatemia- repleted per PCCM 11. Hypocalcemia- on 2.5 Ca bath and correct for alb of 2 is 7.9.  Cont to follow.  12. Acute on chronic diastolic CHF with mitral valve disease- on dobutamine per PCCM. Cardiology consulted 13. Disposition- poor overall prognosis. Recommendtransitioning to comfort measures.   Donetta Potts, MD [employer] [phone]

## 2017-12-05 NOTE — Progress Notes (Addendum)
PULMONARY / CRITICAL CARE MEDICINE   Name: Marcus [name] MRN: [id number] DOB: 05/27/1966    ADMISSION DATE:  12/16/2017  REFERRING MD:  ED  CHIEF COMPLAINT: Weakness  HISTORY OF PRESENT ILLNESS:   52 yo male former smoker presented with fever, weakness, hypotension.  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  He was tx for C diff in March and April of 2019.  UDS positive for opiates, cocaine.  SUBJECTIVE:  Remains on vent support, pressors, CRRT.  VITAL SIGNS: BP 101/76   Pulse (!) 129   Temp (!) 96.8 F (36 C) (Oral)   Resp (!) 25   Ht 5\' 9"  (1.753 m)   Wt 166 lb 10.7 oz (75.6 kg)   SpO2 100%   BMI 24.61 kg/m   INTAKE / OUTPUT: I/O last 3 completed shifts: In: 9567.8 [I.V.:6400.1; NG/GT:2480; IV Piggyback:687.6] Out: [id number] [Urine:145; Other:11896]  PHYSICAL EXAMINATION:  General - ill appearing Eyes - pupils reactive ENT - ETT in place Cardiac - regular, 2/6 systolic murmur Chest - b/l rhonchi Abd - soft, non tender Ext - 1+ edema, feet sore to touch Skin - ischemic changes in hands and feet Neuro - unresponsive   LABS:  BMET Recent Labs  Lab 12/03/17 1708 12/04/17 1721 12/05/17 0400  NA 135 136 135  K 3.7 3.8 4.1  CL 94* 102 103  CO2 32 23 20*  BUN 33* 24* 23*  CREATININE 1.64* 1.14 1.01  GLUCOSE 171* 162* 121*   Electrolytes Recent Labs  Lab 12/03/17 1708 12/04/17 0416 12/04/17 1721 12/05/17 0400  CALCIUM 6.3*  --  6.9* 7.4*  MG 1.7 2.0 2.0 2.0  PHOS 2.3*  2.3* 1.3* 2.0*  2.2* 1.8*   CBC Recent Labs  Lab 12/03/17 0417  12/03/17 1632 12/04/17 0416 12/05/17 0400  WBC 13.7*  --   --  11.5* 13.7*  HGB 11.0*   < > 13.9 10.5* 11.4*  HCT 35.1*   < > 41.0 34.3* 37.0*  PLT 68*  --   --  37* 21*   < > = values in this interval not displayed.   Coag's Recent Labs  Lab 12/31/2017 1020 12/03/17 0417 12/04/17 0416 12/05/17 0400  APTT  --  56* 79* 82*  INR 1.72  --   --   --    Sepsis  Markers Recent Labs  Lab 12/28/2017 1020  12/02/17 0634 12/02/17 0839 12/03/17 1321  LATICACIDVEN  --    < > 9.3* 10.1* 2.8*  PROCALCITON 23.04  --   --   --   --    < > = values in this interval not displayed.   ABG Recent Labs  Lab 12/04/17 0406 12/04/17 1712 12/05/17 0317  PHART 7.540* 7.492* 7.424  PCO2ART 35.3 29.7* 33.4  PO2ART 146.0* 82.0* 85.0    Liver Enzymes Recent Labs  Lab 12/29/2017 1020  12/03/17 1708 12/04/17 1721 12/05/17 0400  AST 113*  --   --   --   --   ALT 157*  --   --   --   --   ALKPHOS 102  --   --   --   --   BILITOT 2.8*  --   --   --   --   ALBUMIN 2.7*   < > 2.0* 2.2* 2.4*   < > = values in this interval not displayed.    Cardiac Enzymes Recent Labs  Lab 12/15/2017 1020  12/14/17 1545 December 14, 2017 2306  TROPONINI 1.07* 1.74* 2.77*    Glucose Recent Labs  Lab 12/04/17 1112 12/04/17 1611 12/04/17 2016 12/04/17 2356 12/05/17 0359 12/05/17 0725  GLUCAP 140* 148* 112* 118* 115* 118*    Imaging Dg Chest Port 1 View  Result Date: 12/05/2017 CLINICAL DATA:  Intubation. EXAM: PORTABLE CHEST 1 VIEW COMPARISON:  12/04/2017. FINDINGS: Endotracheal tube tip 1.8 cm above the lower portion of the carina. 1-2 cm retraction should be considered. Bilateral IJ lines and NG tube in stable position. Cardiomegaly again noted. Bilateral pulmonary infiltrates/edema again noted. Slight worsening from prior exam. Bilateral pleural effusions again noted. No pneumothorax. IMPRESSION: 1. Endotracheal tube tip 1.8 cm above the lower portion of the carina. 1-2 cm retraction should be considered. Bilateral IJ lines and NG tube in stable position. 2. Cardiomegaly. Bilateral pulmonary infiltrates/edema and bilateral pleural effusions again noted. Slight worsening from prior exam. Electronically Signed   By: Maisie Fus  Register   On: 12/05/2017 06:10     STUDIES:  Echo 7/03 >> EF 55 to 60%, grade 2 DD, severe MR, PAS 46 mmHg, 22 mm vegetation on MV and possible vegetation  on TV  CULTURES: Blood 7/01 >> MRSA C diff PCR 7/01 >> Ag positive, toxin negative, PCR positive Sputum 7/02 >> Blood 7/02 >>   ANTIBIOTICS: Zosyn 7/01 >> 7/02 IV Vancomycin 7/01 >> PO Vancomycin 7/02 >>  SIGNIFICANT EVENTS: 7/01 Admit, CHF team consulted 7/02 Renal and start CRRT, ID consulted 7/03 cardiology s/o  LINES/TUBES: Lt IJ CVL 7/01 >>  ETT 7/02 >> Rt IJ HD cath 7/02 >>   DISCUSSION: 52 yo male with septic shock, AKI, hyperkalemia, acidosis with MRSA in blood and C diff recurrence.  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  Reports using cocaine and IV heroin within past one month.  Echo shows vegetation on MV and possibly on TV.  Not candidate for valve replacement.  Family consider transition to comfort measures.  ASSESSMENT / PLAN:  Acute hypoxic respiratory failure. Pleural effusions. - full vent support - f/u CXR, ABG - oxygen to keep SpO2 90 to 95%  Septic shock with MRSA in blood, and C diff Ag/PCR positive with hx of recurrent C diff. - pressors to keep MAP > 65 - day 5 IV vancomycin, day 4 enteral vancomycin  Acute on chronic diastolic CHF. Mitral valve disease >> not a surgical candidate for valve replacement. Elevated troponin from demand ischemia. SVT from levophed. - continue dobutamine  Hyponatremia, hyperkalemia. AKI with ATN. CKD 3. Metabolic acidosis with lactic acidosis. - CRRT per renal  Thrombocytopenia from sepsis. - f/u CBC  Digital ischemia. - Monitor clinically  Severe protein calorie malnutrition. Hypoglycemia. - improved - continue tube feeds  DVT prophylaxis - SCDs SUP - pepcid Nutrition - tube feeds Goals of care - full code  Prognosis for meaningful recover poor.  He is has recurrent endocarditis with severe MR and not a candidate for valve surgery.  Will need to d/w pt's family about goals of care.  CC time 33 minutes  Coralyn Helling, MD [facility] Pulmonary/Critical Care 12/05/2017, 10:25 AM

## 2017-12-06 ENCOUNTER — Inpatient Hospital Stay (HOSPITAL_COMMUNITY): Payer: Medicaid Other

## 2017-12-06 LAB — POCT ACTIVATED CLOTTING TIME
ACTIVATED CLOTTING TIME: 191 s
ACTIVATED CLOTTING TIME: 197 s
ACTIVATED CLOTTING TIME: 202 s
ACTIVATED CLOTTING TIME: 202 s
ACTIVATED CLOTTING TIME: 208 s
Activated Clotting Time: 180 seconds
Activated Clotting Time: 186 seconds
Activated Clotting Time: 191 seconds
Activated Clotting Time: 191 seconds
Activated Clotting Time: 191 seconds
Activated Clotting Time: 208 seconds
Activated Clotting Time: 197 seconds
Activated Clotting Time: 197 seconds

## 2017-12-06 LAB — CBC
HEMATOCRIT: 36.7 % — AB (ref 39.0–52.0)
HEMOGLOBIN: 11 g/dL — AB (ref 13.0–17.0)
MCH: 23 pg — ABNORMAL LOW (ref 26.0–34.0)
MCHC: 30 g/dL (ref 30.0–36.0)
MCV: 76.6 fL — ABNORMAL LOW (ref 78.0–100.0)
Platelets: 28 10*3/uL — CL (ref 150–400)
RBC: 4.79 MIL/uL (ref 4.22–5.81)
RDW: 22.7 % — ABNORMAL HIGH (ref 11.5–15.5)
WBC: 13.1 10*3/uL — AB (ref 4.0–10.5)

## 2017-12-06 LAB — RENAL FUNCTION PANEL
ALBUMIN: 2.3 g/dL — AB (ref 3.5–5.0)
ANION GAP: 11 (ref 5–15)
ANION GAP: 9 (ref 5–15)
Albumin: 2.2 g/dL — ABNORMAL LOW (ref 3.5–5.0)
BUN: 22 mg/dL — AB (ref 6–20)
BUN: 24 mg/dL — AB (ref 6–20)
CALCIUM: 7.5 mg/dL — AB (ref 8.9–10.3)
CO2: 18 mmol/L — ABNORMAL LOW (ref 22–32)
CO2: 20 mmol/L — AB (ref 22–32)
Calcium: 7.5 mg/dL — ABNORMAL LOW (ref 8.9–10.3)
Chloride: 105 mmol/L (ref 98–111)
Chloride: 106 mmol/L (ref 98–111)
Creatinine, Ser: 0.9 mg/dL (ref 0.61–1.24)
Creatinine, Ser: 0.92 mg/dL (ref 0.61–1.24)
GFR calc Af Amer: >60 mL/min (ref >60)
GFR calc Af Amer: >60 mL/min (ref >60)
GFR calc non Af Amer: >60 mL/min (ref >60)
GLUCOSE: 149 mg/dL — AB (ref 70–99)
Glucose, Bld: 123 mg/dL — ABNORMAL HIGH (ref 70–99)
PHOSPHORUS: 1.9 mg/dL — AB (ref 2.5–4.6)
POTASSIUM: 4.5 mmol/L (ref 3.5–5.1)
Phosphorus: 1.6 mg/dL — ABNORMAL LOW (ref 2.5–4.6)
Potassium: 4.4 mmol/L (ref 3.5–5.1)
SODIUM: 134 mmol/L — AB (ref 135–145)
SODIUM: 135 mmol/L (ref 135–145)

## 2017-12-06 LAB — POCT I-STAT 3, ART BLOOD GAS (G3+)
Acid-base deficit: 3 mmol/L — ABNORMAL HIGH (ref 0.0–2.0)
BICARBONATE: 19.7 mmol/L — AB (ref 20.0–28.0)
O2 Saturation: 98 %
PCO2 ART: 27 mmHg — AB (ref 32.0–48.0)
Patient temperature: 97.7
TCO2: 20 mmol/L — ABNORMAL LOW (ref 22–32)
pH, Arterial: 7.469 — ABNORMAL HIGH (ref 7.350–7.450)
pO2, Arterial: 92 mmHg (ref 83.0–108.0)

## 2017-12-06 LAB — CULTURE, RESPIRATORY

## 2017-12-06 LAB — APTT: aPTT: 73 seconds — ABNORMAL HIGH (ref 24–36)

## 2017-12-06 LAB — GLUCOSE, CAPILLARY
GLUCOSE-CAPILLARY: 87 mg/dL (ref 70–99)
GLUCOSE-CAPILLARY: 102 mg/dL — AB (ref 70–99)
Glucose-Capillary: 102 mg/dL — ABNORMAL HIGH (ref 70–99)
Glucose-Capillary: 115 mg/dL — ABNORMAL HIGH (ref 70–99)
Glucose-Capillary: 120 mg/dL — ABNORMAL HIGH (ref 70–99)
Glucose-Capillary: 91 mg/dL (ref 70–99)

## 2017-12-06 LAB — MAGNESIUM: MAGNESIUM: 2.2 mg/dL (ref 1.7–2.4)

## 2017-12-06 LAB — CULTURE, RESPIRATORY W GRAM STAIN

## 2017-12-06 MED ORDER — FENTANYL 2500MCG IN NS 250ML (10MCG/ML) PREMIX INFUSION
25.0000 ug/h | INTRAVENOUS | Status: DC
  Administered 2017-12-06: 50 ug/h via INTRAVENOUS
  Administered 2017-12-07: 100 ug/h via INTRAVENOUS
  Administered 2017-12-08 – 2017-12-09 (×4): 200 ug/h via INTRAVENOUS
  Administered 2017-12-10: 400 ug/h via INTRAVENOUS
  Filled 2017-12-06 (×8): qty 250

## 2017-12-06 MED ORDER — FENTANYL BOLUS VIA INFUSION
50.0000 ug | INTRAVENOUS | Status: DC | PRN
  Administered 2017-12-10: 50 ug via INTRAVENOUS
  Filled 2017-12-06: qty 50

## 2017-12-06 NOTE — Progress Notes (Signed)
PULMONARY / CRITICAL CARE MEDICINE   Name: EDDIE PAYETTE MRN: [id number] DOB: 02/08/1966    ADMISSION DATE:  22-Dec-2017  REFERRING MD:  ED  CHIEF COMPLAINT: Weakness  HISTORY OF PRESENT ILLNESS:   52 yo male former smoker presented with fever, weakness, hypotension.  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  He was tx for C diff in March and April of 2019.  UDS positive for opiates, cocaine.  SUBJECTIVE:  Remains on CRRT, vent, pressors.  Had to start dobutamine back.  VITAL SIGNS: BP 95/70   Pulse (!) 129   Temp (!) 96.5 F (35.8 C) (Axillary)   Resp (!) 25   Ht 5\' 9"  (1.753 m)   Wt 173 lb 11.6 oz (78.8 kg)   SpO2 100%   BMI 25.65 kg/m   INTAKE / OUTPUT: I/O last 3 completed shifts: In: 8239.6 [I.V.:5529.8; NG/GT:2510; IV Piggyback:199.9] Out: 40981 [Urine:120; Other:9978]  PHYSICAL EXAMINATION:  General - ill appearing Eyes - pupils reactive ENT - ETT in place Cardiac - regular, tachycardic, 2/6 murmur Chest - b/l rhonchi Abd - soft, non tender Ext - 2+ edema Skin - ischemic changes in hands and feet Neuro - RASS -3  LABS:  BMET Recent Labs  Lab 12/05/17 0400 12/05/17 1622 12/06/17 0400  NA 135 136 134*  K 4.1 4.1 4.4  CL 103 105 105  CO2 20* 21* 18*  BUN 23* 25* 22*  CREATININE 1.01 0.94 0.90  GLUCOSE 121* 149* 123*   Electrolytes Recent Labs  Lab 12/04/17 1721 12/05/17 0400 12/05/17 1622 12/06/17 0400  CALCIUM 6.9* 7.4* 7.5* 7.5*  MG 2.0 2.0  --  2.2  PHOS 2.0*  2.2* 1.8* 1.6* 1.9*   CBC Recent Labs  Lab 12/04/17 0416 12/05/17 0400 12/06/17 0400  WBC 11.5* 13.7* 13.1*  HGB 10.5* 11.4* 11.0*  HCT 34.3* 37.0* 36.7*  PLT 37* 21* 28*   Coag's Recent Labs  Lab 2017-12-22 1020  12/04/17 0416 12/05/17 0400 12/06/17 0400  APTT  --    < > 79* 82* 73*  INR 1.72  --   --   --   --    < > = values in this interval not displayed.   Sepsis Markers Recent Labs  Lab 12/22/17 1020   12/02/17 0634 12/02/17 0839 12/03/17 1321  LATICACIDVEN  --    < > 9.3* 10.1* 2.8*  PROCALCITON 23.04  --   --   --   --    < > = values in this interval not displayed.   ABG Recent Labs  Lab 12/04/17 1712 12/05/17 0317 12/06/17 0307  PHART 7.492* 7.424 7.469*  PCO2ART 29.7* 33.4 27.0*  PO2ART 82.0* 85.0 92.0    Liver Enzymes Recent Labs  Lab 12-22-2017 1020  12/05/17 0400 12/05/17 1622 12/06/17 0400  AST 113*  --   --   --   --   ALT 157*  --   --   --   --   ALKPHOS 102  --   --   --   --   BILITOT 2.8*  --   --   --   --   ALBUMIN 2.7*   < > 2.4* 2.3* 2.3*   < > = values in this interval not displayed.    Cardiac Enzymes Recent Labs  Lab 12-22-2017 1020 22-Dec-2017 1545 Dec 22, 2017 2306  TROPONINI 1.07* 1.74* 2.77*    Glucose Recent Labs  Lab 12/05/17  1206 12/05/17 1621 12/05/17 2026 12/06/17 0020 12/06/17 0342 12/06/17 0748  GLUCAP 121* 114* 115* 120* 87 102*    Imaging Dg Chest Port 1 View  Result Date: 12/06/2017 CLINICAL DATA:  Hypoxia EXAM: PORTABLE CHEST 1 VIEW COMPARISON:  December 05, 2017 FINDINGS: Endotracheal tube tip is 1.8 cm above the carina. Central catheter tips are in the superior vena cava. Nasogastric tube tip and side port are below the diaphragm. No pneumothorax. There are pleural effusions bilaterally, larger on the right than on the left. There is patchy atelectasis in each lung base with consolidation in the medial right base. Heart is mildly enlarged with pulmonary vascularity normal. No adenopathy. There is aortic atherosclerosis. No bone lesions. IMPRESSION: Tube and catheter positions as described without pneumothorax. Persistent pleural effusions bilaterally with bibasilar atelectasis and consolidation concerning for pneumonia medial right base. Stable cardiac prominence. There is aortic atherosclerosis. Aortic Atherosclerosis (ICD10-I70.0). Electronically Signed   By: Bretta BangWilliam  Woodruff III M.D.   On: 12/06/2017 07:11     STUDIES:  Echo  7/03 >> EF 55 to 60%, grade 2 DD, severe MR, PAS 46 mmHg, 22 mm vegetation on MV and possible vegetation on TV  CULTURES: Blood 7/01 >> MRSA C diff PCR 7/01 >> Ag positive, toxin negative, PCR positive Sputum 7/02 >> Staph aureus Blood 7/02 >>   ANTIBIOTICS: Zosyn 7/01 >> 7/02 IV Vancomycin 7/01 >> PO Vancomycin 7/02 >>  SIGNIFICANT EVENTS: 7/01 Admit, CHF team consulted 7/02 Renal and start CRRT, ID consulted 7/03 cardiology s/o 7/05 DNR  LINES/TUBES: Lt IJ CVL 7/01 >>  ETT 7/02 >> Rt IJ HD cath 7/02 >>   DISCUSSION: 52 yo male with septic shock, AKI, hyperkalemia, acidosis with MRSA in blood and C diff recurrence.  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  Reports using cocaine and IV heroin within past one month.  Echo shows vegetation on MV and possibly on TV.  Not candidate for valve replacement.  Family consider transition to comfort measures.  ASSESSMENT / PLAN:  Acute hypoxic respiratory failure. Pleural effusions. - full vent support - f/u CXR, ABG - oxygen to keep SpO2 90 to 95%  Septic shock with MRSA in blood, and C diff Ag/PCR positive with hx of recurrent C diff. - pressors to keep MAP > 65 - day 6 IV vancomycin, day 5 enteral vancomycin  Acute on chronic diastolic CHF. Mitral valve disease >> not a surgical candidate for valve replacement. Elevated troponin from demand ischemia. SVT from levophed. - continue dobutamine  Hyponatremia, hyperkalemia. AKI with ATN. CKD 3. Metabolic acidosis with lactic acidosis. - CRRT per renal  Thrombocytopenia from sepsis. - f/u CBC  Digital ischemia. - monitor clinically for line of demarcation  Severe protein calorie malnutrition. Hypoglycemia. - tube feeds  DVT prophylaxis - SCDs SUP - pepcid Nutrition - tube feeds Goals of care - DNR, no escalation of care.  Family discussing whether to transition to comfort measures early part of next week.   Coralyn HellingVineet Grizelda Piscopo, MD [facility] 12/06/2017, 9:11  AM

## 2017-12-06 NOTE — Progress Notes (Signed)
S: Mother is at bedside and updated on status.  She understands the grim prognosis and only hopes he can hold on until his brother can see him tomorrow. O:BP (!) 88/63   Pulse (!) 133   Temp (!) 97.4 F (36.3 C) (Axillary)   Resp (!) 29   Ht _0  (1.753 m)   Wt 78.8 kg (173 lb 11.6 oz)   SpO2 100%   BMI 25.65 kg/m   Intake/Output Summary (Last 24 hours) at 12/06/2017 1405 Last data filed at 12/06/2017 1300 Gross per 24 hour  Intake 5314.97 ml  Output 6534 ml  Net -1219.03 ml   Intake/Output: I/O last 3 completed shifts: In: 8239.6 [I.V.:5529.8; NG/GT:2510; IV Piggyback:199.9] Out: 77939 [Urine:120; QZESP:2330]  Intake/Output this shift:  Total I/O In: 1408.2 [I.V.:923.2; NG/GT:485] Out: 1731 [Other:1731] Weight change: 3.2 kg (7 lb 0.9 oz) Gen: critically ill appearing WM intubated and unresponsive QTM:AUQJF Resp: occ rhonchi Abd: benign Ext: ischemic changes to extremities unchanged, 1+ edema  Recent Labs  Lab 12/14/2017 1020  12/02/17 1650 12/03/17 0417 12/03/17 0423 12/03/17 1023 12/03/17 1632 12/03/17 1708 12/04/17 0416 12/04/17 1721 12/05/17 0400 12/05/17 1622 12/06/17 0400  NA 129*   < > 134* 134* 133*  --  138 135  --  136 135 136 134*  K 5.5*   < > 5.7* 4.8 4.3  --  3.8 3.7  --  3.8 4.1 4.1 4.4  CL 93*   < > 103 93*  --   --   --  94*  --  102 103 105 105  CO2 21*   < > 14* 24  --   --   --  32  --  23 20* 21* 18*  GLUCOSE 116*   < > 50* 125*  --   --   --  171*  --  162* 121* 149* 123*  BUN 37*   < > 51* 39*  --   --   --  33*  --  24* 23* 25* 22*  CREATININE 2.31*   < > 2.52* 1.91*  --   --   --  1.64*  --  1.14 1.01 0.94 0.90  ALBUMIN 2.7*  --  2.3* 2.1*  --   --   --  2.0*  --  2.2* 2.4* 2.3* 2.3*  CALCIUM 8.6*   < > 7.2* 6.5*  --   --   --  6.3*  --  6.9* 7.4* 7.5* 7.5*  PHOS  --    < > 6.3* 3.8  --  2.9  --  2.3*  2.3* 1.3* 2.0*  2.2* 1.8* 1.6* 1.9*  AST 113*  --   --   --   --   --   --   --   --   --   --   --   --   ALT 157*  --   --   --   --    --   --   --   --   --   --   --   --    < > = values in this interval not displayed.   Liver Function Tests: Recent Labs  Lab 12/31/2017 1020  12/05/17 0400 12/05/17 1622 12/06/17 0400  AST 113*  --   --   --   --   ALT 157*  --   --   --   --   ALKPHOS 102  --   --   --   --  BILITOT 2.8*  --   --   --   --   PROT 6.9  --   --   --   --   ALBUMIN 2.7*   < > 2.4* 2.3* 2.3*   < > = values in this interval not displayed.   No results for input(s): LIPASE, AMYLASE in the last 168 hours. Recent Labs  Lab 12/09/2017 1020  AMMONIA 28   CBC: Recent Labs  Lab 12/14/2017 1020 12/02/17 0335 12/03/17 0417  12/04/17 0416 12/05/17 0400 12/06/17 0400  WBC 5.4 14.2* 13.7*  --  11.5* 13.7* 13.1*  NEUTROABS 4.5  --   --   --   --   --   --   HGB 12.6* 11.5* 11.0*   < > 10.5* 11.4* 11.0*  HCT 42.2 38.0* 35.1*   < > 34.3* 37.0* 36.7*  MCV 78.4 76.8* 74.8*  --  76.4* 75.5* 76.6*  PLT 112* 101* 68*  --  37* 21* 28*   < > = values in this interval not displayed.   Cardiac Enzymes: Recent Labs  Lab 12/09/2017 1020 12/18/2017 1545 12/29/2017 2306  TROPONINI 1.07* 1.74* 2.77*   CBG: Recent Labs  Lab 12/05/17 2026 12/06/17 0020 12/06/17 0342 12/06/17 0748 12/06/17 1226  GLUCAP 115* 120* 87 102* 91    Iron Studies: No results for input(s): IRON, TIBC, TRANSFERRIN, FERRITIN in the last 72 hours. Studies/Results: Dg Chest Port 1 View  Result Date: 12/06/2017 CLINICAL DATA:  Hypoxia EXAM: PORTABLE CHEST 1 VIEW COMPARISON:  December 05, 2017 FINDINGS: Endotracheal tube tip is 1.8 cm above the carina. Central catheter tips are in the superior vena cava. Nasogastric tube tip and side port are below the diaphragm. No pneumothorax. There are pleural effusions bilaterally, larger on the right than on the left. There is patchy atelectasis in each lung base with consolidation in the medial right base. Heart is mildly enlarged with pulmonary vascularity normal. No adenopathy. There is aortic  atherosclerosis. No bone lesions. IMPRESSION: Tube and catheter positions as described without pneumothorax. Persistent pleural effusions bilaterally with bibasilar atelectasis and consolidation concerning for pneumonia medial right base. Stable cardiac prominence. There is aortic atherosclerosis. Aortic Atherosclerosis (ICD10-I70.0). Electronically Signed   By: Lowella Grip III M.D.   On: 12/06/2017 07:11   Dg Chest Port 1 View  Result Date: 12/05/2017 CLINICAL DATA:  Intubation. EXAM: PORTABLE CHEST 1 VIEW COMPARISON:  12/04/2017. FINDINGS: Endotracheal tube tip 1.8 cm above the lower portion of the carina. 1-2 cm retraction should be considered. Bilateral IJ lines and NG tube in stable position. Cardiomegaly again noted. Bilateral pulmonary infiltrates/edema again noted. Slight worsening from prior exam. Bilateral pleural effusions again noted. No pneumothorax. IMPRESSION: 1. Endotracheal tube tip 1.8 cm above the lower portion of the carina. 1-2 cm retraction should be considered. Bilateral IJ lines and NG tube in stable position. 2. Cardiomegaly. Bilateral pulmonary infiltrates/edema and bilateral pleural effusions again noted. Slight worsening from prior exam. Electronically Signed   By: Marcello Moores  Register   On: 12/05/2017 06:10   . aspirin  81 mg Per Tube Daily  . chlorhexidine gluconate (MEDLINE KIT)  15 mL Mouth Rinse BID  . Chlorhexidine Gluconate Cloth  6 each Topical Daily  . famotidine  20 mg Per Tube Q12H  . feeding supplement (PRO-STAT SUGAR FREE 64)  30 mL Per Tube Daily  . mouth rinse  15 mL Mouth Rinse 10 times per day  . vancomycin  500 mg Per Tube Q6H  BMET    Component Value Date/Time   NA 134 (L) 12/06/2017 0400   NA 138 09/29/2017 1350   K 4.4 12/06/2017 0400   CL 105 12/06/2017 0400   CO2 18 (L) 12/06/2017 0400   GLUCOSE 123 (H) 12/06/2017 0400   BUN 22 (H) 12/06/2017 0400   BUN 21 09/29/2017 1350   CREATININE 0.90 12/06/2017 0400   CALCIUM 7.5 (L) 12/06/2017  0400   GFRNONAA >60 12/06/2017 0400   GFRAA >60 12/06/2017 0400   CBC    Component Value Date/Time   WBC 13.1 (H) 12/06/2017 0400   RBC 4.79 12/06/2017 0400   HGB 11.0 (L) 12/06/2017 0400   HGB 10.4 (L) 09/29/2017 1350   HCT 36.7 (L) 12/06/2017 0400   HCT 33.4 (L) 09/29/2017 1350   PLT 28 (LL) 12/06/2017 0400   PLT 305 09/29/2017 1350   MCV 76.6 (L) 12/06/2017 0400   MCV 86 09/29/2017 1350   MCH 23.0 (L) 12/06/2017 0400   MCHC 30.0 12/06/2017 0400   RDW 22.7 (H) 12/06/2017 0400   RDW 16.1 (H) 09/29/2017 1350   LYMPHSABS 0.4 (L) 12/25/2017 1020   LYMPHSABS 1.6 09/09/2017 1019   MONOABS 0.2 12/31/2017 1020   EOSABS 0.2 12/07/2017 1020   EOSABS 0.2 09/09/2017 1019   BASOSABS 0.1 12/23/2017 1020   BASOSABS 0.1 09/09/2017 1019     Assessment/Plan: 1. Oliguric ARF in setting of MRSA bacteremia/sepsis-remains anuric and pressor dependent. CVVHD started 12/02/17 and continues to have low BP's despite max neo and also on vasopressin and dobutamine. 1. CT surgery has turned him down for MVR so I agree withtransitioning him to comfort measures and stopping CVVHDas this is medically futile 2. Still requiring pressors without any improvement 3. Recommend stopping CVVHD and transitioning to comfort care, discussed with his mother and per nsg they are leaning towards transition soon 2. Metabolic acidosis due to #2-[id number] withCVVHD.will change to 4K/2.5 Ca prismasate as replacement fluids (stopped isotonic bicarb 12/04/17). 1. Concerning that his bicarb is dropping again.  If lower with pm labs will change replacement fluids to bicarb.  May be having some bowel ischemia due to high vasopressor demands. 3. Hyperkalemia- due to #1 and #2. As above 4. MRSA bacteremia/sepsis- severe requiring high dose pressorsand TEE with worsening mitral valve vegetation. Vancomycin per ID 5. 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 Guy Sandifer assistance and agree that he is not a candidate  for repair. TEE with evidence of worsening MV vegetation and agree with transitioning to comfort measures. Awaiting family meeting.  6. Hyponatremia -due to #1,improvedafter CVVHD 7. Respiratory distress- has pleural effusions on CXR and will UF with CVVHD. 8. Thrombocytopenia 9. Ischemic/embolic changes to extremities- darker on right foot/toes and more mottled over legs, poor prognosis. 10. Hypophosphatemia- repleted per PCCM 11. Hypocalcemia- on 2.5 Ca bath and correct for alb of 2 is 7.9. Cont to follow.  12. Acute on chronic diastolic CHF with mitral valve disease- on dobutamine per PCCM. Cardiology consulted 13. Disposition- poor overall prognosis. Recommendtransitioning to comfort measures.   Donetta Potts, MD [employer] [employer] [phone]

## 2017-12-07 ENCOUNTER — Inpatient Hospital Stay (HOSPITAL_COMMUNITY): Payer: Medicaid Other

## 2017-12-07 LAB — RENAL FUNCTION PANEL
ALBUMIN: 2.2 g/dL — AB (ref 3.5–5.0)
ALBUMIN: 2.2 g/dL — AB (ref 3.5–5.0)
ANION GAP: 9 (ref 5–15)
ANION GAP: 6 (ref 5–15)
BUN: 22 mg/dL — ABNORMAL HIGH (ref 6–20)
BUN: 23 mg/dL — AB (ref 6–20)
CHLORIDE: 106 mmol/L (ref 98–111)
CO2: 19 mmol/L — ABNORMAL LOW (ref 22–32)
CO2: 21 mmol/L — ABNORMAL LOW (ref 22–32)
Calcium: 7.5 mg/dL — ABNORMAL LOW (ref 8.9–10.3)
Calcium: 7.3 mg/dL — ABNORMAL LOW (ref 8.9–10.3)
Chloride: 107 mmol/L (ref 98–111)
Creatinine, Ser: 0.87 mg/dL (ref 0.61–1.24)
Creatinine, Ser: 0.85 mg/dL (ref 0.61–1.24)
GFR calc Af Amer: >60 mL/min (ref >60)
GFR calc Af Amer: >60 mL/min (ref >60)
GFR calc non Af Amer: >60 mL/min (ref >60)
GLUCOSE: 121 mg/dL — AB (ref 70–99)
Glucose, Bld: 137 mg/dL — ABNORMAL HIGH (ref 70–99)
PHOSPHORUS: 1.7 mg/dL — AB (ref 2.5–4.6)
PHOSPHORUS: 1.8 mg/dL — AB (ref 2.5–4.6)
POTASSIUM: 4.1 mmol/L (ref 3.5–5.1)
POTASSIUM: 4.2 mmol/L (ref 3.5–5.1)
Sodium: 135 mmol/L (ref 135–145)
Sodium: 133 mmol/L — ABNORMAL LOW (ref 135–145)

## 2017-12-07 LAB — BLOOD GAS, ARTERIAL
Acid-base deficit: 2.7 mmol/L — ABNORMAL HIGH (ref 0.0–2.0)
BICARBONATE: 20.6 mmol/L (ref 20.0–28.0)
Drawn by: 414221
FIO2: 40
LHR: 12 {breaths}/min
O2 Saturation: 98.4 %
PEEP/CPAP: 5 cmH2O
PH ART: 7.47 — AB (ref 7.350–7.450)
PO2 ART: 109 mmHg — AB (ref 83.0–108.0)
Patient temperature: 96.6
VT: 570 mL
pCO2 arterial: 28.3 mmHg — ABNORMAL LOW (ref 32.0–48.0)

## 2017-12-07 LAB — POCT ACTIVATED CLOTTING TIME
ACTIVATED CLOTTING TIME: 197 s
ACTIVATED CLOTTING TIME: 197 s
ACTIVATED CLOTTING TIME: 197 s
ACTIVATED CLOTTING TIME: 202 s
Activated Clotting Time: 197 seconds
Activated Clotting Time: 219 seconds
Activated Clotting Time: 202 seconds
Activated Clotting Time: 175 seconds

## 2017-12-07 LAB — GLUCOSE, CAPILLARY
GLUCOSE-CAPILLARY: 89 mg/dL (ref 70–99)
GLUCOSE-CAPILLARY: 99 mg/dL (ref 70–99)
Glucose-Capillary: 103 mg/dL — ABNORMAL HIGH (ref 70–99)
Glucose-Capillary: 147 mg/dL — ABNORMAL HIGH (ref 70–99)
Glucose-Capillary: 92 mg/dL (ref 70–99)
Glucose-Capillary: 92 mg/dL (ref 70–99)

## 2017-12-07 LAB — MAGNESIUM: MAGNESIUM: 2.1 mg/dL (ref 1.7–2.4)

## 2017-12-07 LAB — CULTURE, BLOOD (ROUTINE X 2)
Culture: NO GROWTH
Culture: NO GROWTH
SPECIAL REQUESTS: ADEQUATE
Special Requests: ADEQUATE

## 2017-12-07 LAB — CBC
HEMATOCRIT: 35.7 % — AB (ref 39.0–52.0)
HEMOGLOBIN: 10.8 g/dL — AB (ref 13.0–17.0)
MCH: 22.8 pg — ABNORMAL LOW (ref 26.0–34.0)
MCHC: 30.3 g/dL (ref 30.0–36.0)
MCV: 75.3 fL — ABNORMAL LOW (ref 78.0–100.0)
Platelets: 25 10*3/uL — CL (ref 150–400)
RBC: 4.74 MIL/uL (ref 4.22–5.81)
RDW: 22.9 % — ABNORMAL HIGH (ref 11.5–15.5)
WBC: 13.9 10*3/uL — ABNORMAL HIGH (ref 4.0–10.5)

## 2017-12-07 LAB — APTT: APTT: 97 s — AB (ref 24–36)

## 2017-12-07 NOTE — Progress Notes (Signed)
PULMONARY / CRITICAL CARE MEDICINE   Name: Marcus [name] MRN: [id number] DOB: Feb 20, 1966    ADMISSION DATE:  12/08/2017  REFERRING MD:  ED  CHIEF COMPLAINT: Weakness  HISTORY OF PRESENT ILLNESS:   52 yo male former smoker presented with fever, weakness, hypotension.  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  He was tx for C diff in March and April of 2019.  UDS positive for opiates, cocaine.  SUBJECTIVE:  Episodes of bradycardia.  BP lower.  VITAL SIGNS: BP (!) 70/60 (BP Location: Right Arm)   Pulse (!) 101   Temp (!) 97.2 F (36.2 C) (Axillary)   Resp (!) 31   Ht 5\' 9"  (1.753 m)   Wt 166 lb 10.7 oz (75.6 kg)   SpO2 100%   BMI 24.61 kg/m   INTAKE / OUTPUT: I/O last 3 completed shifts: In: 8832.7 [I.V.:6017.7; NG/GT:2615; IV Piggyback:200] Out: 9683 [Urine:10; Other:9673]  PHYSICAL EXAMINATION:  General - sedated Eyes - pupils reactive ENT - ETT in place Cardiac - irregular, 2/6 murmur Chest - no wheeze, rales Abd - soft, non tender Ext - 2+ edema Skin - ischemic changes of hands/feet Neuro - RASS -3  LABS:  BMET Recent Labs  Lab 12/06/17 0400 12/06/17 1613 12/07/17 0500  NA 134* 135 135  K 4.4 4.5 4.1  CL 105 106 107  CO2 18* 20* 19*  BUN 22* 24* 22*  CREATININE 0.90 0.92 0.87  GLUCOSE 123* 149* 121*   Electrolytes Recent Labs  Lab 12/05/17 0400  12/06/17 0400 12/06/17 1613 12/07/17 0500  CALCIUM 7.4*   < > 7.5* 7.5* 7.5*  MG 2.0  --  2.2  --  2.1  PHOS 1.8*   < > 1.9* 1.6* 1.7*   < > = values in this interval not displayed.   CBC Recent Labs  Lab 12/05/17 0400 12/06/17 0400 12/07/17 0500  WBC 13.7* 13.1* 13.9*  HGB 11.4* 11.0* 10.8*  HCT 37.0* 36.7* 35.7*  PLT 21* 28* 25*   Coag's Recent Labs  Lab 12/13/2017 1020  12/05/17 0400 12/06/17 0400 12/07/17 0500  APTT  --    < > 82* 73* 97*  INR 1.72  --   --   --   --    < > = values in this interval not displayed.   Sepsis  Markers Recent Labs  Lab 12/09/2017 1020  12/02/17 0634 12/02/17 0839 12/03/17 1321  LATICACIDVEN  --    < > 9.3* 10.1* 2.8*  PROCALCITON 23.04  --   --   --   --    < > = values in this interval not displayed.   ABG Recent Labs  Lab 12/05/17 0317 12/06/17 0307 12/07/17 0428  PHART 7.424 7.469* 7.470*  PCO2ART 33.4 27.0* 28.3*  PO2ART 85.0 92.0 109*    Liver Enzymes Recent Labs  Lab 12/11/2017 1020  12/06/17 0400 12/06/17 1613 12/07/17 0500  AST 113*  --   --   --   --   ALT 157*  --   --   --   --   ALKPHOS 102  --   --   --   --   BILITOT 2.8*  --   --   --   --   ALBUMIN 2.7*   < > 2.3* 2.2* 2.2*   < > = values in this interval not displayed.    Cardiac Enzymes Recent Labs  Lab 12/12/2017 1020  12/18/2017 1545 12/17/2017 2306  TROPONINI 1.07* 1.74* 2.77*    Glucose Recent Labs  Lab 12/06/17 1226 12/06/17 1638 12/06/17 1958 12/06/17 2359 12/07/17 0412 12/07/17 0732  GLUCAP 91 115* 102* 103* 89 99    Imaging Dg Chest Port 1 View  Result Date: 12/07/2017 CLINICAL DATA:  Hypoxia EXAM: PORTABLE CHEST 1 VIEW COMPARISON:  December 06, 2017 FINDINGS: Endotracheal tube tip is 3.1 cm above the carina. Nasogastric tube tip and side port are below the diaphragm. Central catheter tips are in the superior vena cava. No pneumothorax. There are pleural effusions bilaterally with bibasilar atelectasis. No new opacity evident. Heart is mildly enlarged with pulmonary vascularity normal. No adenopathy. No bone lesions. IMPRESSION: Tube and catheter positions as described without pneumothorax. Small pleural effusions bilaterally with bibasilar atelectasis. Stable cardiac prominence. Electronically Signed   By: Bretta BangWilliam  Woodruff III M.D.   On: 12/07/2017 07:20     STUDIES:  Echo 7/03 >> EF 55 to 60%, grade 2 DD, severe MR, PAS 46 mmHg, 22 mm vegetation on MV and possible vegetation on TV  CULTURES: Blood 7/01 >> MRSA C diff PCR 7/01 >> Ag positive, toxin negative, PCR  positive Sputum 7/02 >> MRSA Blood 7/02 >>   ANTIBIOTICS: Zosyn 7/01 >> 7/02 IV Vancomycin 7/01 >> PO Vancomycin 7/02 >>  SIGNIFICANT EVENTS: 7/01 Admit, CHF team consulted 7/02 Renal and start CRRT, ID consulted 7/03 cardiology s/o 7/05 DNR  LINES/TUBES: Lt IJ CVL 7/01 >>  ETT 7/02 >> Rt IJ HD cath 7/02 >>   DISCUSSION: 52 yo male with septic shock, AKI, hyperkalemia, acidosis with MRSA in blood and C diff recurrence.  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  Reports using cocaine and IV heroin within past one month.  Echo shows vegetation on MV and possibly on TV.  Not candidate for valve replacement.  Family consider transition to comfort measures.  ASSESSMENT / PLAN:  Acute hypoxic respiratory failure. Pleural effusions. - full vent support  Septic shock with MRSA in blood, and C diff Ag/PCR positive with hx of recurrent C diff. - continue pressors - day 7 IV vanc, day 6 enteral vanc  Acute on chronic diastolic CHF. Mitral valve disease >> not a surgical candidate for valve replacement. Elevated troponin from demand ischemia. SVT from levophed. - dobutamine  Hyponatremia, hyperkalemia. AKI with ATN. CKD 3. Metabolic acidosis with lactic acidosis. - CRRT  Thrombocytopenia from sepsis. - f/u CBC  Digital ischemia. - monitor clinically for line of demarcation  Severe protein calorie malnutrition. Hypoglycemia. - tube feeds  DVT prophylaxis - SCDs SUP - pepcid Nutrition - tube feeds Goals of care - DNR, no escalation of care.  Likely transition to comfort measures 7/08.   Coralyn HellingVineet Deanndra Kirley, MD [facility] Pulmonary/Critical Care 12/07/2017, 8:40 AM

## 2017-12-07 NOTE — Progress Notes (Signed)
S: had episodes of bradycardia overnight with drop in BP O:BP (!) 76/56   Pulse (!) 108   Temp (!) 97.2 F (36.2 C) (Axillary)   Resp 20   Ht '5\' 9"'$  (1.753 m)   Wt 75.6 kg (166 lb 10.7 oz)   SpO2 98%   BMI 24.61 kg/m   Intake/Output Summary (Last 24 hours) at 12/07/2017 1210 Last data filed at 12/07/2017 1100 Gross per 24 hour  Intake 5955.23 ml  Output 5844 ml  Net 111.23 ml   Intake/Output: I/O last 3 completed shifts: In: 8832.7 [I.V.:6017.7; NG/GT:2615; IV Piggyback:200] Out: 9683 [Urine:10; ZOXWR:6045]  Intake/Output this shift:  Total I/O In: 965.5 [I.V.:725.5; NG/GT:240] Out: 980 [Other:980] Weight change: -3.2 kg (-7 lb 0.9 oz) Gen: critically ill appearing WM intubated and sedated CVS: tachy Resp: occ rhonchi Abd: +BS Ext: + anasarca, ischemic changes of extremities, greatest in Right leg/toes with mottled appearance of skin  Recent Labs  Lab 12/14/2017 1020  12/03/17 1708 12/04/17 0416 12/04/17 1721 12/05/17 0400 12/05/17 1622 12/06/17 0400 12/06/17 1613 12/07/17 0500  NA 129*   < > 135  --  136 135 136 134* 135 135  K 5.5*   < > 3.7  --  3.8 4.1 4.1 4.4 4.5 4.1  CL 93*   < > 94*  --  102 103 105 105 106 107  CO2 21*   < > 32  --  23 20* 21* 18* 20* 19*  GLUCOSE 116*   < > 171*  --  162* 121* 149* 123* 149* 121*  BUN 37*   < > 33*  --  24* 23* 25* 22* 24* 22*  CREATININE 2.31*   < > 1.64*  --  1.14 1.01 0.94 0.90 0.92 0.87  ALBUMIN 2.7*   < > 2.0*  --  2.2* 2.4* 2.3* 2.3* 2.2* 2.2*  CALCIUM 8.6*   < > 6.3*  --  6.9* 7.4* 7.5* 7.5* 7.5* 7.5*  PHOS  --    < > 2.3*  2.3* 1.3* 2.0*  2.2* 1.8* 1.6* 1.9* 1.6* 1.7*  AST 113*  --   --   --   --   --   --   --   --   --   ALT 157*  --   --   --   --   --   --   --   --   --    < > = values in this interval not displayed.   Liver Function Tests: Recent Labs  Lab 12/23/2017 1020  12/06/17 0400 12/06/17 1613 12/07/17 0500  AST 113*  --   --   --   --   ALT 157*  --   --   --   --   ALKPHOS 102  --   --   --    --   BILITOT 2.8*  --   --   --   --   PROT 6.9  --   --   --   --   ALBUMIN 2.7*   < > 2.3* 2.2* 2.2*   < > = values in this interval not displayed.   No results for input(s): LIPASE, AMYLASE in the last 168 hours. Recent Labs  Lab 12/02/2017 1020  AMMONIA 28   CBC: Recent Labs  Lab 12/13/2017 1020  12/03/17 0417  12/04/17 0416 12/05/17 0400 12/06/17 0400 12/07/17 0500  WBC 5.4   < > 13.7*  --  11.5* 13.7* 13.1*  13.9*  NEUTROABS 4.5  --   --   --   --   --   --   --   HGB 12.6*   < > 11.0*   < > 10.5* 11.4* 11.0* 10.8*  HCT 42.2   < > 35.1*   < > 34.3* 37.0* 36.7* 35.7*  MCV 78.4   < > 74.8*  --  76.4* 75.5* 76.6* 75.3*  PLT 112*   < > 68*  --  37* 21* 28* 25*   < > = values in this interval not displayed.   Cardiac Enzymes: Recent Labs  Lab 12/30/2017 1020 12/18/2017 1545 12/09/2017 2306  TROPONINI 1.07* 1.74* 2.77*   CBG: Recent Labs  Lab 12/06/17 1638 12/06/17 1958 12/06/17 2359 12/07/17 0412 12/07/17 0732  GLUCAP 115* 102* 103* 89 99    Iron Studies: No results for input(s): IRON, TIBC, TRANSFERRIN, FERRITIN in the last 72 hours. Studies/Results: Dg Chest Port 1 View  Result Date: 12/07/2017 CLINICAL DATA:  Hypoxia EXAM: PORTABLE CHEST 1 VIEW COMPARISON:  December 06, 2017 FINDINGS: Endotracheal tube tip is 3.1 cm above the carina. Nasogastric tube tip and side port are below the diaphragm. Central catheter tips are in the superior vena cava. No pneumothorax. There are pleural effusions bilaterally with bibasilar atelectasis. No new opacity evident. Heart is mildly enlarged with pulmonary vascularity normal. No adenopathy. No bone lesions. IMPRESSION: Tube and catheter positions as described without pneumothorax. Small pleural effusions bilaterally with bibasilar atelectasis. Stable cardiac prominence. Electronically Signed   By: Lowella Grip III M.D.   On: 12/07/2017 07:20   Dg Chest Port 1 View  Result Date: 12/06/2017 CLINICAL DATA:  Hypoxia EXAM: PORTABLE CHEST 1  VIEW COMPARISON:  December 05, 2017 FINDINGS: Endotracheal tube tip is 1.8 cm above the carina. Central catheter tips are in the superior vena cava. Nasogastric tube tip and side port are below the diaphragm. No pneumothorax. There are pleural effusions bilaterally, larger on the right than on the left. There is patchy atelectasis in each lung base with consolidation in the medial right base. Heart is mildly enlarged with pulmonary vascularity normal. No adenopathy. There is aortic atherosclerosis. No bone lesions. IMPRESSION: Tube and catheter positions as described without pneumothorax. Persistent pleural effusions bilaterally with bibasilar atelectasis and consolidation concerning for pneumonia medial right base. Stable cardiac prominence. There is aortic atherosclerosis. Aortic Atherosclerosis (ICD10-I70.0). Electronically Signed   By: Lowella Grip III M.D.   On: 12/06/2017 07:11   . chlorhexidine gluconate (MEDLINE KIT)  15 mL Mouth Rinse BID  . Chlorhexidine Gluconate Cloth  6 each Topical Daily  . famotidine  20 mg Per Tube Q12H  . feeding supplement (PRO-STAT SUGAR FREE 64)  30 mL Per Tube Daily  . mouth rinse  15 mL Mouth Rinse 10 times per day  . vancomycin  500 mg Per Tube Q6H    BMET    Component Value Date/Time   NA 135 12/07/2017 0500   NA 138 09/29/2017 1350   K 4.1 12/07/2017 0500   CL 107 12/07/2017 0500   CO2 19 (L) 12/07/2017 0500   GLUCOSE 121 (H) 12/07/2017 0500   BUN 22 (H) 12/07/2017 0500   BUN 21 09/29/2017 1350   CREATININE 0.87 12/07/2017 0500   CALCIUM 7.5 (L) 12/07/2017 0500   GFRNONAA >60 12/07/2017 0500   GFRAA >60 12/07/2017 0500   CBC    Component Value Date/Time   WBC 13.9 (H) 12/07/2017 0500   RBC 4.74 12/07/2017 0500  HGB 10.8 (L) 12/07/2017 0500   HGB 10.4 (L) 09/29/2017 1350   HCT 35.7 (L) 12/07/2017 0500   HCT 33.4 (L) 09/29/2017 1350   PLT 25 (LL) 12/07/2017 0500   PLT 305 09/29/2017 1350   MCV 75.3 (L) 12/07/2017 0500   MCV 86 09/29/2017  1350   MCH 22.8 (L) 12/07/2017 0500   MCHC 30.3 12/07/2017 0500   RDW 22.9 (H) 12/07/2017 0500   RDW 16.1 (H) 09/29/2017 1350   LYMPHSABS 0.4 (L) 12/19/2017 1020   LYMPHSABS 1.6 09/09/2017 1019   MONOABS 0.2 12/09/2017 1020   EOSABS 0.2 12/05/2017 1020   EOSABS 0.2 09/09/2017 1019   BASOSABS 0.1 12/23/2017 1020   BASOSABS 0.1 09/09/2017 1019      Assessment/Plan: 1. Oliguric ARF in setting of MRSA bacteremia/sepsis-remains anuric and pressor dependent. CVVHD started 12/02/17 and continues to have low BP's despite max neo and also on vasopressin and dobutamine. 1. CT surgery has turned him down for MVR so I agree withtransitioning him to comfort measures and stopping CVVHDas this is medically futile 2. Still requiring pressors without any improvement 3. Recommend stopping CVVHD and transitioning to comfort care, discussed with his mother and per nsg they are leaning towards transition soon 2. Metabolic acidosis due to #8-[id number] withCVVHD.will change to 4K/2.5 Ca prismasate as replacement fluids (stoppedisotonic bicarb 12/04/17). 1. Concerning that his bicarb is dropping again.  If lower with pm labs will change replacement fluids to bicarb.  May be having some bowel ischemia due to high vasopressor demands. 3. Hyperkalemia- due to #1 and #2. As above 4. MRSA bacteremia/sepsis- severe requiring high dose pressorsand TEE with worsening mitral valve vegetation. Vancomycin per ID 5. 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 Guy Sandifer assistance and agree that he is not a candidate for repair. TEE with evidence of worsening MV vegetation and agree with transitioning to comfort measures. Awaiting family meeting.  6. Hyponatremia -due to #1,improvedafter CVVHD 7. Respiratory distress- has pleural effusions on CXR and will UF with CVVHD. 8. Thrombocytopenia 9. Ischemic/embolic changes to extremities- darker on right foot/toes and more mottled over legs, poor  prognosis. 10. Hypophosphatemia- repleted per PCCM 11. Hypocalcemia- on 2.5 Ca bath and correct for alb of 2 is 7.9. Cont to follow.  12. Acute on chronic diastolic CHF with mitral valve disease- on dobutamine per PCCM. Cardiology consulted 13. Disposition- poor overall prognosis. Recommendtransitioning to comfort measures and hopeful terminal wean tomorrow.   Donetta Potts, MD [employer] [employer] [phone]

## 2017-12-08 ENCOUNTER — Ambulatory Visit: Payer: Self-pay | Admitting: Thoracic Surgery (Cardiothoracic Vascular Surgery)

## 2017-12-08 LAB — CBC
HCT: 32.9 % — ABNORMAL LOW (ref 39.0–52.0)
Hemoglobin: 9.9 g/dL — ABNORMAL LOW (ref 13.0–17.0)
MCH: 22.9 pg — ABNORMAL LOW (ref 26.0–34.0)
MCHC: 30.1 g/dL (ref 30.0–36.0)
MCV: 76.2 fL — ABNORMAL LOW (ref 78.0–100.0)
PLATELETS: 20 10*3/uL — AB (ref 150–400)
RBC: 4.32 MIL/uL (ref 4.22–5.81)
RDW: 23.2 % — AB (ref 11.5–15.5)
WBC: 16.6 10*3/uL — AB (ref 4.0–10.5)

## 2017-12-08 LAB — POCT ACTIVATED CLOTTING TIME
ACTIVATED CLOTTING TIME: 186 s
Activated Clotting Time: 180 seconds
Activated Clotting Time: 186 seconds
Activated Clotting Time: 197 seconds
Activated Clotting Time: 202 seconds

## 2017-12-08 LAB — RENAL FUNCTION PANEL
ALBUMIN: 2.1 g/dL — AB (ref 3.5–5.0)
ANION GAP: 7 (ref 5–15)
Albumin: 2 g/dL — ABNORMAL LOW (ref 3.5–5.0)
Anion gap: 5 (ref 5–15)
BUN: 24 mg/dL — AB (ref 6–20)
BUN: 24 mg/dL — ABNORMAL HIGH (ref 6–20)
CALCIUM: 7.6 mg/dL — AB (ref 8.9–10.3)
CHLORIDE: 107 mmol/L (ref 98–111)
CO2: 27 mmol/L (ref 22–32)
CO2: 21 mmol/L — AB (ref 22–32)
CREATININE: 0.85 mg/dL (ref 0.61–1.24)
Calcium: 7.3 mg/dL — ABNORMAL LOW (ref 8.9–10.3)
Chloride: 105 mmol/L (ref 98–111)
Creatinine, Ser: 0.9 mg/dL (ref 0.61–1.24)
GFR calc Af Amer: >60 mL/min (ref >60)
GFR calc Af Amer: >60 mL/min (ref >60)
GFR calc non Af Amer: >60 mL/min (ref >60)
GFR calc non Af Amer: >60 mL/min (ref >60)
GLUCOSE: 120 mg/dL — AB (ref 70–99)
GLUCOSE: 128 mg/dL — AB (ref 70–99)
PHOSPHORUS: 2.2 mg/dL — AB (ref 2.5–4.6)
POTASSIUM: 5.7 mmol/L — AB (ref 3.5–5.1)
Phosphorus: 5.2 mg/dL — ABNORMAL HIGH (ref 2.5–4.6)
Potassium: 4.3 mmol/L (ref 3.5–5.1)
SODIUM: 137 mmol/L (ref 135–145)
Sodium: 135 mmol/L (ref 135–145)

## 2017-12-08 LAB — GLUCOSE, CAPILLARY
GLUCOSE-CAPILLARY: 89 mg/dL (ref 70–99)
GLUCOSE-CAPILLARY: 53 mg/dL — AB (ref 70–99)
GLUCOSE-CAPILLARY: 72 mg/dL (ref 70–99)
Glucose-Capillary: 101 mg/dL — ABNORMAL HIGH (ref 70–99)
Glucose-Capillary: 91 mg/dL (ref 70–99)
Glucose-Capillary: 91 mg/dL (ref 70–99)
Glucose-Capillary: 92 mg/dL (ref 70–99)

## 2017-12-08 LAB — APTT: aPTT: 123 seconds — ABNORMAL HIGH (ref 24–36)

## 2017-12-08 LAB — MAGNESIUM: Magnesium: 2.2 mg/dL (ref 1.7–2.4)

## 2017-12-08 MED ORDER — SODIUM PHOSPHATES 45 MMOLE/15ML IV SOLN
30.0000 mmol | Freq: Once | INTRAVENOUS | Status: AC
  Administered 2017-12-08: 30 mmol via INTRAVENOUS
  Filled 2017-12-08: qty 10

## 2017-12-08 NOTE — Progress Notes (Signed)
Subjective: Interval History: has no complaint ,on vent ,sedated.  Objective: Vital signs in last 24 hours: Temp:  [97.5 F (36.4 C)-99 F (37.2 C)] 97.5 F (36.4 C) (07/08 0800) Pulse Rate:  [85-132] 113 (07/08 0900) Resp:  [12-23] 19 (07/08 0900) BP: (38-87)/(15-68) 82/60 (07/08 0700) SpO2:  [89 %-100 %] 89 % (07/08 0900) Arterial Line BP: (76-90)/(46-61) 89/59 (07/08 0900) FiO2 (%):  [40 %] 40 % (07/08 0721) Weight:  [76.1 kg (167 lb 12.3 oz)] 76.1 kg (167 lb 12.3 oz) (07/08 0529) Weight change: 0.5 kg (1 lb 1.6 oz)  Intake/Output from previous day: 07/07 0701 - 07/08 0700 In: 6076.6 [I.V.:4411.6; AO/ZH:0865G/GT:1465; IV Piggyback:200] Out: 5735 [Urine:10] Intake/Output this shift: Total I/O In: 370.3 [I.V.:370.3] Out: 258 [Other:258]  General appearance: moves to stim only,  Neck: RIJ cath Resp: diminished breath sounds bilaterally and rales bibasilar Cardio: S1, S2 normal and systolic murmur: holosystolic 2/6, blowing at apex GI: pos bs, liver down 6 cm Extremities: edema 4+  Lab Results: Recent Labs    12/07/17 0500 12/08/17 0500  WBC 13.9* 16.6*  HGB 10.8* 9.9*  HCT 35.7* 32.9*  PLT 25* 20*   BMET:  Recent Labs    12/07/17 1623 12/08/17 0500  NA 133* 137  K 4.2 4.3  CL 106 105  CO2 21* 27  GLUCOSE 137* 120*  BUN 23* 24*  CREATININE 0.85 0.85  CALCIUM 7.3* 7.6*   No results for input(s): PTH in the last 72 hours. Iron Studies: No results for input(s): IRON, TIBC, TRANSFERRIN, FERRITIN in the last 72 hours.  Studies/Results: Dg Chest Port 1 View  Result Date: 12/07/2017 CLINICAL DATA:  Hypoxia EXAM: PORTABLE CHEST 1 VIEW COMPARISON:  December 06, 2017 FINDINGS: Endotracheal tube tip is 3.1 cm above the carina. Nasogastric tube tip and side port are below the diaphragm. Central catheter tips are in the superior vena cava. No pneumothorax. There are pleural effusions bilaterally with bibasilar atelectasis. No new opacity evident. Heart is mildly enlarged with  pulmonary vascularity normal. No adenopathy. No bone lesions. IMPRESSION: Tube and catheter positions as described without pneumothorax. Small pleural effusions bilaterally with bibasilar atelectasis. Stable cardiac prominence. Electronically Signed   By: Bretta BangWilliam  Woodruff III M.D.   On: 12/07/2017 07:20    I have reviewed the patient's current medications.  Assessment/Plan: 1 AKI hemodynamic, ??immune/embolic.  Good solute clearance, Acid/base/K on CRRT, vol xs but high dose pressors.   2 Endocarditis with MRSA 3 REsp failure 4 Substance abuse 5 Nonaderence P CRRT, ? Withdrawal of care,     LOS: 7 days   Barbera Perritt 12/08/2017,10:14 AM

## 2017-12-08 NOTE — Progress Notes (Addendum)
PULMONARY / CRITICAL CARE MEDICINE   Name: Marcus [name] MRN: [id number] DOB: 1965/12/29    ADMISSION DATE:  12-02-2017  REFERRING MD:  ED  CHIEF COMPLAINT: Weakness  HISTORY OF PRESENT ILLNESS:   52 yo male former smoker presented with fever, weakness, hypotension.  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  He was tx for C diff in March and April of 2019.  UDS positive for opiates, cocaine.  SUBJECTIVE:  RN reports pt unchanged.  Remains on pressors, fentanyl, CVVHD   VITAL SIGNS: BP (!) 82/60   Pulse (!) 113   Temp (!) 97.5 F (36.4 C) (Axillary)   Resp 14   Ht 5\' 9"  (1.753 m)   Wt 167 lb 12.3 oz (76.1 kg)   SpO2 (!) 89%   BMI 24.78 kg/m   INTAKE / OUTPUT: I/O last 3 completed shifts: In: 9169.1 [I.V.:6614.1; NG/GT:2355; IV Piggyback:200] Out: 8614 [Urine:10; Other:8604]  PHYSICAL EXAMINATION: General: critically ill appearing male in NAD on vent, CVVHD HEENT: ETT, jaundice, R IJ HD cath, L IJ TLC  Neuro: sedate CV: s1s2 rrr, tachy, 2/6 murmur  PULM: even/non-labored, lungs bilaterally clear GI: protuberant, BSx4 active, TF infusing  Extremities: warm/dry, 2+ generalized edema  Skin: jaundice, ischemic changes of hands / feet  LABS:  BMET Recent Labs  Lab 12/07/17 0500 12/07/17 1623 12/08/17 0500  NA 135 133* 137  K 4.1 4.2 4.3  CL 107 106 105  CO2 19* 21* 27  BUN 22* 23* 24*  CREATININE 0.87 0.85 0.85  GLUCOSE 121* 137* 120*   Electrolytes Recent Labs  Lab 12/06/17 0400  12/07/17 0500 12/07/17 1623 12/08/17 0500  CALCIUM 7.5*   < > 7.5* 7.3* 7.6*  MG 2.2  --  2.1  --  2.2  PHOS 1.9*   < > 1.7* 1.8* 2.2*   < > = values in this interval not displayed.   CBC Recent Labs  Lab 12/06/17 0400 12/07/17 0500 12/08/17 0500  WBC 13.1* 13.9* 16.6*  HGB 11.0* 10.8* 9.9*  HCT 36.7* 35.7* 32.9*  PLT 28* 25* 20*   Coag's Recent Labs  Lab 12/06/17 0400 12/07/17 0500 12/08/17 0500  APTT 73* 97*  123*   Sepsis Markers Recent Labs  Lab 12/02/17 0634 12/02/17 0839 12/03/17 1321  LATICACIDVEN 9.3* 10.1* 2.8*   ABG Recent Labs  Lab 12/05/17 0317 12/06/17 0307 12/07/17 0428  PHART 7.424 7.469* 7.470*  PCO2ART 33.4 27.0* 28.3*  PO2ART 85.0 92.0 109*    Liver Enzymes Recent Labs  Lab 12/07/17 0500 12/07/17 1623 12/08/17 0500  ALBUMIN 2.2* 2.2* 2.1*    Cardiac Enzymes Recent Labs  Lab December 02, 2017 1545 02-Dec-2017 2306  TROPONINI 1.74* 2.77*    Glucose Recent Labs  Lab 12/07/17 1244 12/07/17 1622 12/07/17 2001 12/08/17 0025 12/08/17 0323 12/08/17 0808  GLUCAP 92 147* 92 91 89 101*    Imaging No results found.   STUDIES:  Echo 7/03 >> EF 55 to 60%, grade 2 DD, severe MR, PAS 46 mmHg, 22 mm vegetation on MV and possible vegetation on TV  CULTURES: Blood 7/01 >> MRSA C diff PCR 7/01 >> Ag positive, toxin negative, PCR positive Sputum 7/02 >> MRSA Blood 7/02 >> negative   ANTIBIOTICS: Zosyn 7/01 >> 7/02 IV Vancomycin 7/01 >> PO Vancomycin 7/02 >>  SIGNIFICANT EVENTS: 7/01 Admit, CHF team consulted 7/02 Renal and start CRRT, ID consulted 7/03 cardiology s/o 7/05 DNR 7/07 Episodes of bradycardia.  BP  lower.  LINES/TUBES: Lt IJ CVL 7/01 >>  ETT 7/02 >> Rt IJ HD cath 7/02 >>   DISCUSSION: 52 yo male with septic shock, AKI, hyperkalemia, acidosis with MRSA in blood and C diff recurrence.  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  Reports using cocaine and IV heroin within past one month.  Echo shows vegetation on MV and possibly on TV.  Not candidate for valve replacement.  Family considering transition to comfort measures.  ASSESSMENT / PLAN:  Acute hypoxic respiratory failure. Pleural effusions. - PRVC 8cc/kg - wean O2 for sats > 90% - follow intermittent CXR  Septic shock with MRSA in blood, and C diff Ag/PCR positive with hx of recurrent C diff. - continue pressors  - ABX as above   Acute on chronic diastolic CHF. Mitral valve disease >> not a  surgical candidate for valve replacement. Elevated troponin from demand ischemia. SVT from levophed. - continue dobutamine  Hyponatremia, hyperkalemia. AKI with ATN. CKD 3. Metabolic acidosis with lactic acidosis. - Nephrology following, appreciate input  - CVVHD   Thrombocytopenia from sepsis. - Trend CBC  - monitor for bleeding   Digital ischemia. - monitor for line of demarcation   Severe protein calorie malnutrition. Hypoglycemia. - TF per Nutrition   DVT prophylaxis - SCDs SUP - pepcid Nutrition - tube feeds Goals of care - DNR, no escalation of care.  Will discuss de-escalation of care once family arrives.  Plan discussed with RN.  Anticipate terminal wean.    Canary BrimBrandi Yoali Conry, NP-C [facility] Pgr: [phone] or if no answer [phone] 12/08/2017, 10:46 AM

## 2017-12-08 NOTE — Progress Notes (Signed)
Pharmacy Antibiotic Note  [name] is a 52 y.o. male admitted on 12/04/2017 with MRSA bacteremia/sepsis.  Pharmacy has been consulted for vancomycin dosing - day #8. Also continuing on vancomycin PO for Cdiff+. Patient remains on CRRT requiring vasopressor support.  Vanc 7/1>> Zosyn 7/1>>7/2 PO Vanc 7/2>>  Metronidazole 7/2 x1  7/2 endotracheal asp > rare MRSA Bcx 7/2: neg x 2  Bcx 7/1: 4/4 MRSA on BCID  7/1 CDiff - Ag positive, toxi neg, PCR pos   Plan: Continue vancomycin 1 gm IV Q 24 hours. Consider comfort care with likely terminal wean toady. If not, will consider a vancomycin trough tomorrow  F/u CRRT tolerance   Height: 5\' 9"  (175.3 cm) Weight: 167 lb 12.3 oz (76.1 kg) IBW/kg (Calculated) : 70.7  Temp (24hrs), Avg:97.9 F (36.6 C), Min:97.5 F (36.4 C), Max:99 F (37.2 C)  Recent Labs  Lab 12/11/2017 1545 12/27/2017 2306  12/02/17 0634 12/02/17 0839  12/03/17 0417 12/03/17 1321  12/04/17 0416  12/05/17 0400  12/06/17 0400 12/06/17 1613 12/07/17 0500 12/07/17 1623 12/08/17 0500  WBC  --   --    < >  --   --   --  13.7*  --   --  11.5*  --  13.7*  --  13.1*  --  13.9*  --  16.6*  CREATININE  --   --    < >  --   --    < > 1.91*  --    < >  --    < > 1.01   < > 0.90 0.92 0.87 0.85 0.85  LATICACIDVEN 4.7* 7.4*  --  9.3* 10.1*  --   --  2.8*  --   --   --   --   --   --   --   --   --   --   VANCORANDOM  --   --   --   --   --   --  22  --   --   --   --   --   --   --   --   --   --   --    < > = values in this interval not displayed.    Estimated Creatinine Clearance: 101.7 mL/min (by C-G formula based on SCr of 0.85 mg/dL).    No Known Allergies  Vinnie LevelBenjamin Madeleyn Schwimmer, PharmD., BCPS Clinical Pharmacist Clinical phone for 12/08/17 until 3:30pm: [phone] If after 3:30pm, please refer to [facility] for unit-specific pharmacist

## 2017-12-08 NOTE — Progress Notes (Signed)
CRITICAL VALUE ALERT  Critical Value:  K level 5.7  Date & Time Notied:  07/0/19  Provider Notified: [facility]   Orders Received/Actions taken: paged

## 2017-12-08 NOTE — Progress Notes (Signed)
Septic shock, aki, hyperkalemia, acidosis with MRSA, cdiff, hx of ivda with severe MR, echo with vegetation on MV and possibly TV .  conts on pressors, fentany iv abx and cvvhd.  Not a candidate for valve replacement, family to discuss terminal wean.

## 2017-12-08 NOTE — Progress Notes (Signed)
Nurse recommended this family for visit from chaplain.  Patient was alone at the time.  I called his name but there was not response.  Prayed for patient and his family and for staff.  Prayers for comfort and peace of mind and heart. Phebe CollaDonna S Rozell Theiler, Chaplain   12/08/17 1600  Clinical Encounter Type  Visited With Patient  Visit Type Spiritual support  Referral From Nurse  Consult/Referral To Chaplain  Spiritual Encounters  Spiritual Needs Prayer  Stress Factors  Patient Stress Factors Not reviewed  Family Stress Factors Not reviewed

## 2017-12-09 ENCOUNTER — Ambulatory Visit: Payer: Self-pay | Admitting: Cardiology

## 2017-12-09 LAB — POCT ACTIVATED CLOTTING TIME
ACTIVATED CLOTTING TIME: 202 s
ACTIVATED CLOTTING TIME: 213 s
ACTIVATED CLOTTING TIME: 213 s
ACTIVATED CLOTTING TIME: 213 s
ACTIVATED CLOTTING TIME: 224 s
ACTIVATED CLOTTING TIME: 224 s
ACTIVATED CLOTTING TIME: 230 s
ACTIVATED CLOTTING TIME: 230 s
ACTIVATED CLOTTING TIME: 235 s
ACTIVATED CLOTTING TIME: 235 s
ACTIVATED CLOTTING TIME: 329 s
Activated Clotting Time: 202 seconds
Activated Clotting Time: 213 seconds
Activated Clotting Time: 219 seconds
Activated Clotting Time: 230 seconds
Activated Clotting Time: 142 seconds
Activated Clotting Time: 208 seconds
Activated Clotting Time: 213 seconds
Activated Clotting Time: 219 seconds
Activated Clotting Time: 235 seconds
Activated Clotting Time: 224 seconds
Activated Clotting Time: 219 seconds
Activated Clotting Time: 224 seconds
Activated Clotting Time: 219 seconds

## 2017-12-09 LAB — RENAL FUNCTION PANEL
ALBUMIN: 2.1 g/dL — AB (ref 3.5–5.0)
ANION GAP: 9 (ref 5–15)
Albumin: 2.2 g/dL — ABNORMAL LOW (ref 3.5–5.0)
Anion gap: 10 (ref 5–15)
BUN: 22 mg/dL — ABNORMAL HIGH (ref 6–20)
BUN: 23 mg/dL — ABNORMAL HIGH (ref 6–20)
CALCIUM: 7.8 mg/dL — AB (ref 8.9–10.3)
CHLORIDE: 102 mmol/L (ref 98–111)
CO2: 25 mmol/L (ref 22–32)
CO2: 22 mmol/L (ref 22–32)
CREATININE: 0.84 mg/dL (ref 0.61–1.24)
Calcium: 7.8 mg/dL — ABNORMAL LOW (ref 8.9–10.3)
Chloride: 103 mmol/L (ref 98–111)
Creatinine, Ser: 0.88 mg/dL (ref 0.61–1.24)
GFR calc non Af Amer: >60 mL/min (ref >60)
Glucose, Bld: 91 mg/dL (ref 70–99)
Glucose, Bld: 122 mg/dL — ABNORMAL HIGH (ref 70–99)
POTASSIUM: 4.6 mmol/L (ref 3.5–5.1)
Phosphorus: 3.8 mg/dL (ref 2.5–4.6)
Phosphorus: 4 mg/dL (ref 2.5–4.6)
Potassium: 4.8 mmol/L (ref 3.5–5.1)
Sodium: 136 mmol/L (ref 135–145)
Sodium: 135 mmol/L (ref 135–145)

## 2017-12-09 LAB — MAGNESIUM: Magnesium: 2.3 mg/dL (ref 1.7–2.4)

## 2017-12-09 LAB — CBC
HEMATOCRIT: 33.5 % — AB (ref 39.0–52.0)
HEMOGLOBIN: 9.8 g/dL — AB (ref 13.0–17.0)
MCH: 22.9 pg — AB (ref 26.0–34.0)
MCHC: 29.3 g/dL — ABNORMAL LOW (ref 30.0–36.0)
MCV: 78.3 fL (ref 78.0–100.0)
PLATELETS: 22 10*3/uL — AB (ref 150–400)
RBC: 4.28 MIL/uL (ref 4.22–5.81)
RDW: 23.7 % — ABNORMAL HIGH (ref 11.5–15.5)
WBC: 17.7 10*3/uL — ABNORMAL HIGH (ref 4.0–10.5)

## 2017-12-09 LAB — GLUCOSE, CAPILLARY
GLUCOSE-CAPILLARY: 71 mg/dL (ref 70–99)
GLUCOSE-CAPILLARY: 74 mg/dL (ref 70–99)
GLUCOSE-CAPILLARY: 94 mg/dL (ref 70–99)
Glucose-Capillary: 105 mg/dL — ABNORMAL HIGH (ref 70–99)

## 2017-12-09 LAB — APTT: APTT: 102 s — AB (ref 24–36)

## 2017-12-09 NOTE — Progress Notes (Signed)
Subjective: Interval History: bp70s, plan is term wean, :?timing .   Objective: Vital signs in last 24 hours: Temp:  [97.5 F (36.4 C)-97.6 F (36.4 C)] 97.6 F (36.4 C) (07/09 0722) Pulse Rate:  [115-123] 118 (07/09 0900) Resp:  [14-23] 17 (07/09 0900) BP: (77)/(60) 77/60 (07/09 0752) SpO2:  [89 %-100 %] 89 % (07/09 0900) Arterial Line BP: (66-85)/(53-63) 73/56 (07/09 0900) FiO2 (%):  [40 %-80 %] 80 % (07/09 0752) Weight:  [77.2 kg (170 lb 3.1 oz)] 77.2 kg (170 lb 3.1 oz) (07/09 0413) Weight change: 1.1 kg (2 lb 6.8 oz)  Intake/Output from previous day: 07/08 0701 - 07/09 0700 In: 5994.5 [P.O.:35; I.V.:4470.5; NG/GT:1260; IV Piggyback:229.1] Out: 5668  Intake/Output this shift: Total I/O In: -  Out: 493 [Other:493]  General appearance: pale and jaundiced, cachectic, opens eyes to verbal stim, no coop Neck: IJ cath Resp: rales bilaterally and rhonchi bilaterally Cardio: irregularly irregular rhythm and systolic murmur: holosystolic 2/6, blowing at apex GI: distended, pos bs Extremities: edema 3-4+  Lab Results: Recent Labs    12/08/17 0500 12/09/17 0213  WBC 16.6* 17.7*  HGB 9.9* 9.8*  HCT 32.9* 33.5*  PLT 20* 22*   BMET:  Recent Labs    12/08/17 1630 12/09/17 0213  NA 135 136  K 5.7* 4.6  CL 107 102  CO2 21* 25  GLUCOSE 128* 91  BUN 24* 22*  CREATININE 0.90 0.88  CALCIUM 7.3* 7.8*   No results for input(s): PTH in the last 72 hours. Iron Studies: No results for input(s): IRON, TIBC, TRANSFERRIN, FERRITIN in the last 72 hours.  Studies/Results: No results found.  I have reviewed the patient's current medications.  Assessment/Plan: 1 AKI endocarditis/sepsis/cardiac failure  Vol xs no removal, high dose pressors, very catabolic 2 Endocarditis 3 failing heart 4 VDRF 5 Schock infx. Cardiac 6 substance abuse P CRRT until term wean     LOS: 8 days   Fayrene FearingJames Yury Schaus 12/09/2017,9:45 AM

## 2017-12-09 NOTE — Progress Notes (Signed)
Patient unstable cardiac rhythm throughout the day when patient stimulated patients O2 desats and heart rate decreases. Unable to turn due to patient becomes unstable. Waiting on extended family to visit.

## 2017-12-09 NOTE — Progress Notes (Signed)
PULMONARY / CRITICAL CARE MEDICINE   Name: Kern ReapBryon T Seufert MRN: [id number] DOB: 12/29/1965    ADMISSION DATE:  12/22/2017  REFERRING MD:  ED  CHIEF COMPLAINT: Weakness  HISTORY OF PRESENT ILLNESS:   52 yo male former smoker presented with fever, weakness, hypotension.  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  He was tx for C diff in March and April of 2019.  UDS positive for opiates, cocaine.  SUBJECTIVE:   Hypotensive despite pressors: 400 mcg Neo, 0.03 Vasopressin Lightly sedated with fentanyl On CVVHD PRVC: 60%, 5 peep   VITAL SIGNS: BP (!) 77/60   Pulse (!) 117   Temp (!) 97.5 F (36.4 C) (Oral)   Resp 17   Ht 5\' 9"  (1.753 m)   Wt 170 lb 3.1 oz (77.2 kg)   SpO2 90%   BMI 25.13 kg/m   INTAKE / OUTPUT: I/O last 3 completed shifts: In: 9207.9 [P.O.:35; I.V.:6878.8; NG/GT:2065; IV Piggyback:229.1] Out: 8444 [Urine:10; Other:8434]  PHYSICAL EXAMINATION: General: Critically ill appearing male, laying in bed, on vent, in no acute distress, CVVHD  HEENT: Atraumatic, normocephalic, ETT in place, jaundice, RIJ HD cath, L IJ TLC Neuro: Sedated, opens eyes to voice, withdraws from pain, Pupils PERRL 3 mm sluggish bilaterally CV: Tachycardia, Regular rhythm, s1s2 noted, 2/6 murmur   PULM: Clear bilaterally, even, non-labored, vent assisted  GI: Distended, taut, non-tender, BS active x4   Extremities: Warm/dry, 2+ generalized edema  Skin: Jaundiced, ischemic changes of bilateral hands/ feet   LABS:  BMET Recent Labs  Lab 12/08/17 0500 12/08/17 1630 12/09/17 0213  NA 137 135 136  K 4.3 5.7* 4.6  CL 105 107 102  CO2 27 21* 25  BUN 24* 24* 22*  CREATININE 0.85 0.90 0.88  GLUCOSE 120* 128* 91   Electrolytes Recent Labs  Lab 12/07/17 0500  12/08/17 0500 12/08/17 1630 12/09/17 0213  CALCIUM 7.5*   < > 7.6* 7.3* 7.8*  MG 2.1  --  2.2  --  2.3  PHOS 1.7*   < > 2.2* 5.2* 3.8   < > = values in this interval not  displayed.   CBC Recent Labs  Lab 12/07/17 0500 12/08/17 0500 12/09/17 0213  WBC 13.9* 16.6* 17.7*  HGB 10.8* 9.9* 9.8*  HCT 35.7* 32.9* 33.5*  PLT 25* 20* 22*   Coag's Recent Labs  Lab 12/07/17 0500 12/08/17 0500 12/09/17 0213  APTT 97* 123* 102*   Sepsis Markers Recent Labs  Lab 12/03/17 1321  LATICACIDVEN 2.8*   ABG Recent Labs  Lab 12/05/17 0317 12/06/17 0307 12/07/17 0428  PHART 7.424 7.469* 7.470*  PCO2ART 33.4 27.0* 28.3*  PO2ART 85.0 92.0 109*    Liver Enzymes Recent Labs  Lab 12/08/17 0500 12/08/17 1630 12/09/17 0213  ALBUMIN 2.1* 2.0* 2.2*    Cardiac Enzymes No results for input(s): TROPONINI, PROBNP in the last 168 hours.  Glucose Recent Labs  Lab 12/08/17 1612 12/08/17 1622 12/08/17 2053 12/09/17 0015 12/09/17 0354 12/09/17 1129  GLUCAP 53* 91 72 71 74 105*    Imaging No results found.   STUDIES:  Echo 7/03 >> EF 55 to 60%, grade 2 DD, severe MR, PAS 46 mmHg, 22 mm vegetation on MV and possible vegetation on TV  CULTURES: Blood 7/01 >> MRSA C diff PCR 7/01 >> Ag positive, toxin negative, PCR positive Sputum 7/02 >> MRSA Blood 7/02 >> negative   ANTIBIOTICS: Zosyn 7/01 >> 7/02 IV Vancomycin 7/01 >>  PO Vancomycin 7/02 >>  SIGNIFICANT EVENTS: 7/01 Admit, CHF team consulted 7/02 Renal and start CRRT, ID consulted 7/03 cardiology s/o 7/05 DNR 7/07 Episodes of bradycardia.  BP lower.  LINES/TUBES: Lt IJ CVL 7/01 >>  ETT 7/02 >> Rt IJ HD cath 7/02 >>   DISCUSSION: 52 yo male with septic shock, AKI, hyperkalemia, acidosis with MRSA in blood and C diff recurrence.  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  Reports using cocaine and IV heroin within past one month.  Echo shows vegetation on MV and possibly on TV.  Not candidate for valve replacement.  Family considering transition to comfort measures.  ASSESSMENT / PLAN:  Acute hypoxic respiratory failure. Pleural effusions. -PRVC: 8 cc/kg -Wean FiO2 / Peep as tolerated to  maintain SpO2 >90% -Follow intermittent CXR  -VAP bundle  Septic shock with MRSA in blood, and C diff Ag/PCR positive with hx of recurrent C diff. -Maintain MAP >65 -Continue Neo-synephrine & Vasopressin to maintain MAP goal  (unable to tolerate Levophed due to  episodes of SVT) -Abx as above   Acute on chronic diastolic CHF. Mitral valve disease >> not a surgical candidate for valve replacement. Elevated troponin from demand ischemia. SVT from levophed. -Continue Dobutamine    Hyponatremia, hyperkalemia. AKI with ATN. CKD 3. Metabolic acidosis with lactic acidosis. -Nephrology following, appreciate input -CVVHD per Nephrology -Monitor I&O's / urine output -Trend BMP -Ensure adequate renal perfusion -Avoid nephrotoxic agents as able -Replace electrolytes as indicated    Thrombocytopenia from sepsis. Anemia -Monitor for s/sx of bleeding -Trend CBC -SCD's for VTE prophylaxis   Digital ischemia. -Monitor for line of demarcation  Severe protein calorie malnutrition. Hypoglycemia. - NPO -Continue tube feeds, rate adjustment per Dietician  DVT prophylaxis - SCDs SUP - pepcid Nutrition - tube feeds Goals of care - DNR, with no escalation of care.  Will discuss de-escalation of care once family arrives.  Anticipate terminal wean when family ready.   Harlon Ditty, AGACNP-BC [facility] 12/09/2017, 11:58 AM

## 2017-12-09 NOTE — Progress Notes (Signed)
Patient still experiences periods of bradycardia and desaturation with stimulation. Plan for comfort care when all family arrives. Multiple family members visiting with patient at this time. Chaplain and RN at bedside. Patient appears comfortable and in no distress.   Mouth care and turns are being held at this time.

## 2017-12-09 NOTE — Progress Notes (Signed)
Family needs support-Patient to be on comfort care Wed if makes it through the night.  Family with him-stayed as long as could to offer pastoral presence, listening, encouragement, waiting with family. Phebe CollaDonna S Andree Heeg, Chaplain   12/09/17 2300  Clinical Encounter Type  Visited With Patient and family together  Visit Type Spiritual support;Critical Care  Referral From Nurse  Consult/Referral To Chaplain  Spiritual Encounters  Spiritual Needs Sacred text;Prayer;Ritual;Emotional  Stress Factors  Patient Stress Factors Not reviewed  Family Stress Factors Not reviewed

## 2017-12-09 NOTE — Progress Notes (Signed)
Patient ID: Kern ReapBryon T Miske, male   DOB: 01/16/1966, 52 y.o.   MRN: [id number]         [facility]    Date of Admission:  12/25/2017   Total days of antibiotics 9         Danford has severe MRSA bacteremia complicated by worsening mitral valve endocarditis and heart failure.  He remains in refractory shock.  His prognosis is extremely poor.  I will continue current care with IV and oral vancomycin pending further discussion about goals of care with his family.          Cliffton AstersJohn Abdou Stocks, MD [facility] [phone] pager   [phone] cell 12/09/2017, 1:08 PM

## 2017-12-10 LAB — MAGNESIUM: MAGNESIUM: 2.4 mg/dL (ref 1.7–2.4)

## 2017-12-10 LAB — RENAL FUNCTION PANEL
ALBUMIN: 2.1 g/dL — AB (ref 3.5–5.0)
Anion gap: 9 (ref 5–15)
BUN: 24 mg/dL — AB (ref 6–20)
CALCIUM: 7.7 mg/dL — AB (ref 8.9–10.3)
CO2: 24 mmol/L (ref 22–32)
CREATININE: 0.85 mg/dL (ref 0.61–1.24)
Chloride: 102 mmol/L (ref 98–111)
GFR calc Af Amer: >60 mL/min (ref >60)
GFR calc non Af Amer: >60 mL/min (ref >60)
GLUCOSE: 126 mg/dL — AB (ref 70–99)
PHOSPHORUS: 3 mg/dL (ref 2.5–4.6)
Potassium: 4.3 mmol/L (ref 3.5–5.1)
Sodium: 135 mmol/L (ref 135–145)

## 2017-12-10 LAB — CBC
HEMATOCRIT: 32.3 % — AB (ref 39.0–52.0)
HEMOGLOBIN: 9.5 g/dL — AB (ref 13.0–17.0)
MCH: 23 pg — ABNORMAL LOW (ref 26.0–34.0)
MCHC: 29.4 g/dL — ABNORMAL LOW (ref 30.0–36.0)
MCV: 78.2 fL (ref 78.0–100.0)
Platelets: 21 10*3/uL — CL (ref 150–400)
RBC: 4.13 MIL/uL — ABNORMAL LOW (ref 4.22–5.81)
RDW: 23.6 % — ABNORMAL HIGH (ref 11.5–15.5)
WBC: 17.5 10*3/uL — ABNORMAL HIGH (ref 4.0–10.5)

## 2017-12-10 LAB — POCT ACTIVATED CLOTTING TIME
ACTIVATED CLOTTING TIME: 208 s
Activated Clotting Time: 197 seconds

## 2017-12-10 LAB — APTT: APTT: 92 s — AB (ref 24–36)

## 2017-12-10 MED ORDER — MORPHINE BOLUS VIA INFUSION
5.0000 mg | INTRAVENOUS | Status: DC | PRN
  Administered 2017-12-10 (×2): 10 mg via INTRAVENOUS
  Filled 2017-12-10: qty 20

## 2017-12-10 MED ORDER — SODIUM CHLORIDE 0.9 % IV SOLN
10.0000 mg/h | INTRAVENOUS | Status: DC
  Administered 2017-12-10: 10 mg/h via INTRAVENOUS
  Filled 2017-12-10: qty 10

## 2017-12-10 MED ORDER — LORAZEPAM BOLUS VIA INFUSION: 2.0000 mg | INTRAVENOUS | Status: DC | PRN

## 2017-12-10 MED ORDER — DEXTROSE 5 % IV SOLN: INTRAVENOUS | Status: DC

## 2017-12-10 MED ORDER — LORAZEPAM 2 MG/ML IJ SOLN: 2.0000 mg | INTRAMUSCULAR | Status: DC | PRN

## 2017-12-11 ENCOUNTER — Telehealth: Payer: Self-pay

## 2017-12-11 ENCOUNTER — Encounter (HOSPITAL_COMMUNITY): Admission: RE | Payer: Medicaid Other | Source: Ambulatory Visit

## 2017-12-11 ENCOUNTER — Inpatient Hospital Stay (HOSPITAL_COMMUNITY)
Admission: RE | Admit: 2017-12-11 | Payer: Medicaid Other | Source: Ambulatory Visit | Admitting: Thoracic Surgery (Cardiothoracic Vascular Surgery)

## 2017-12-11 SURGERY — REPAIR, MITRAL VALVE, MINIMALLY INVASIVE
Anesthesia: General | Site: Chest | Laterality: Right

## 2017-12-11 NOTE — Telephone Encounter (Signed)
On 12/11/17 I received a d/c from [employer] (original).  The d/c is for cremation.  The patient is a patient of Doctor Agarwala.  [facility].  On 12/12/17 I received the d/c back from Doctor Vassie LollAlva. I got the d/c ready and called the funeral home to let them know the d/c is ready for pickup. I also faxed a copy to the funeral home per the funeral home request.

## 2017-12-25 ENCOUNTER — Ambulatory Visit: Payer: Self-pay | Admitting: Adult Health

## 2018-01-01 NOTE — Progress Notes (Signed)
Morphine 220 ml wasted and witnessed per Nash-Finch CompanySheleigh RN

## 2018-01-01 NOTE — Progress Notes (Signed)
The patient was extubated to room air at 1105. The patient's family is at the bedside at this time.

## 2018-01-01 NOTE — Progress Notes (Signed)
175 mls Fentanyl wasted and witnessed with Brayton CavesJessie RN

## 2018-01-01 NOTE — Progress Notes (Signed)
   04-Feb-2018 1100  Clinical Encounter Type  Visited With Patient;Patient and family together  Visit Type Initial  Referral From Nurse  Consult/Referral To Chaplain  Spiritual Encounters  Spiritual Needs Emotional;Grief support  Stress Factors  Patient Stress Factors Exhausted  Family Stress Factors Exhausted    Pt was laying on his bed seemingly transitioning. Large group of family members on site and tearful. Chaplain introduced herself and had some meaningful and supportive time with Pt and family. Family continued to play music in the room for the pt. Chaplain provided compassionate presence.  [name] a Water quality scientistMusiko-Holley, E. I. du PontChaplain

## 2018-01-01 NOTE — Progress Notes (Signed)
Patient passed  Peacefully at [id number] with family at bedside. No respirations auscultated and no heart sounds assessed by myself and Waneta MartinsJessie RN. Dr Denese KillingsAgarwala notified

## 2018-01-01 NOTE — Progress Notes (Signed)
CRRT stopped at 1045 for comfort care with family at bedside.patient was extubated and appears comfortable on morphine infusion with family at bedside.vasopressin neo and fentanyl infusions stopped.

## 2018-01-01 NOTE — Progress Notes (Signed)
Nutrition Brief Note  Chart reviewed. Pt now transitioning to comfort care.  No further nutrition interventions warranted at this time.  Please re-consult as needed.   Deniese Oberry RD, LDN Clinical Nutrition Pager # - [phone]    

## 2018-01-01 NOTE — Death Summary Note (Signed)
DEATH SUMMARY   Patient Details  Name: Marcus [name] MRN: [id number] DOB: 09/21/1965  Admission/Discharge Information   Admit Date:  12/09/2017  Date of Death:  2017-07-12  Time of Death:  1318  Length of Stay: 9  Referring Physician: Loletta SpecterGomez, Roger David, PA-C   Reason(s) for Hospitalization  septic shock due to bacterial endocarditis.   Diagnoses  Preliminary cause of death:  Secondary Diagnoses (including complications and co-morbidities):  Principal Problem:   MRSA bacteremia Active Problems:   Sepsis (HCC)   Protein-calorie malnutrition, severe   Hx of Clostridium difficile infection   Brief Hospital Course (including significant findings, care, treatment, and services provided and events leading to death)  Marcus [name] is a 52 y.o. year old male who was admitted in septic shock requiring multiple vasopressors. He developed AKI requiring CRRT. He remained comatose.  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  Goals of care discussion initiated by Dr Craige CottaSood and progressively transitioned from DNR no escalation to full comfort care. Patient passed away at 1318 on December 10, 2017.    Pertinent Labs and Studies  Significant Diagnostic Studies Dg Abd 1 View  Result Date: 12/02/2017 CLINICAL DATA:  52 year old male with nasogastric tube placement. Subsequent encounter. EXAM: ABDOMEN - 1 VIEW COMPARISON:  12/02/2017 2:28 p.m. FINDINGS: Nasogastric tube tip gastric antrum level with side hole proximal gastric body level. Paucity of bowel gas. Postsurgical changes left acetabulum with left hip joint degenerative changes. IMPRESSION: Nasogastric tube tip gastric antrum level with side hole proximal gastric body level. Electronically Signed   By: Lacy DuverneySteven  Olson M.D.   On: 12/02/2017 20:04   Dg Chest Port 1 View  Result Date: 12/07/2017 CLINICAL DATA:  Hypoxia EXAM: PORTABLE CHEST 1 VIEW COMPARISON:  December 06, 2017 FINDINGS: Endotracheal tube tip is 3.1  cm above the carina. Nasogastric tube tip and side port are below the diaphragm. Central catheter tips are in the superior vena cava. No pneumothorax. There are pleural effusions bilaterally with bibasilar atelectasis. No new opacity evident. Heart is mildly enlarged with pulmonary vascularity normal. No adenopathy. No bone lesions. IMPRESSION: Tube and catheter positions as described without pneumothorax. Small pleural effusions bilaterally with bibasilar atelectasis. Stable cardiac prominence. Electronically Signed   By: Bretta BangWilliam  Woodruff III M.D.   On: 12/07/2017 07:20   Dg Chest Port 1 View  Result Date: 12/06/2017 CLINICAL DATA:  Hypoxia EXAM: PORTABLE CHEST 1 VIEW COMPARISON:  December 05, 2017 FINDINGS: Endotracheal tube tip is 1.8 cm above the carina. Central catheter tips are in the superior vena cava. Nasogastric tube tip and side port are below the diaphragm. No pneumothorax. There are pleural effusions bilaterally, larger on the right than on the left. There is patchy atelectasis in each lung base with consolidation in the medial right base. Heart is mildly enlarged with pulmonary vascularity normal. No adenopathy. There is aortic atherosclerosis. No bone lesions. IMPRESSION: Tube and catheter positions as described without pneumothorax. Persistent pleural effusions bilaterally with bibasilar atelectasis and consolidation concerning for pneumonia medial right base. Stable cardiac prominence. There is aortic atherosclerosis. Aortic Atherosclerosis (ICD10-I70.0). Electronically Signed   By: Bretta BangWilliam  Woodruff III M.D.   On: 12/06/2017 07:11   Dg Chest Port 1 View  Result Date: 12/05/2017 CLINICAL DATA:  Intubation. EXAM: PORTABLE CHEST 1 VIEW COMPARISON:  12/04/2017. FINDINGS: Endotracheal tube tip 1.8 cm above the lower portion of the carina. 1-2 cm retraction should be considered. Bilateral IJ lines and NG tube  in stable position. Cardiomegaly again noted. Bilateral pulmonary infiltrates/edema again  noted. Slight worsening from prior exam. Bilateral pleural effusions again noted. No pneumothorax. IMPRESSION: 1. Endotracheal tube tip 1.8 cm above the lower portion of the carina. 1-2 cm retraction should be considered. Bilateral IJ lines and NG tube in stable position. 2. Cardiomegaly. Bilateral pulmonary infiltrates/edema and bilateral pleural effusions again noted. Slight worsening from prior exam. Electronically Signed   By: Maisie Fus  Register   On: 12/05/2017 06:10   Dg Chest Port 1 View  Result Date: 12/04/2017 CLINICAL DATA:  Respiratory failure EXAM: PORTABLE CHEST 1 VIEW COMPARISON:  12/03/2017 FINDINGS: Cardiac shadow is again enlarged. Left jugular central line, right jugular temporary dialysis catheter, endotracheal tube and nasogastric catheter are noted in satisfactory position. Bilateral pleural effusions are again seen and stable. Bibasilar atelectasis is likely present as well. IMPRESSION: No change in bibasilar disease. Tubes and lines as described. Electronically Signed   By: Alcide Clever M.D.   On: 12/04/2017 06:58   Dg Chest Port 1 View  Result Date: 12/03/2017 CLINICAL DATA:  Shortness of breath, pleural effusion. EXAM: PORTABLE CHEST 1 VIEW COMPARISON:  Radiograph December 02, 2017. FINDINGS: Stable cardiomegaly. Endotracheal and nasogastric tubes are unchanged in position. Bilateral internal jugular catheters are unchanged in position. No pneumothorax is noted. Stable bibasilar opacities are noted concerning for atelectasis or edema with associated pleural effusions. Bony thorax is unremarkable. IMPRESSION: Stable support apparatus. Stable bilateral lung opacities as described above. Electronically Signed   By: Lupita Raider, M.D.   On: 12/03/2017 07:22   Dg Chest Port 1 View  Result Date: 12/02/2017 CLINICAL DATA:  Central line placement. EXAM: PORTABLE CHEST 1 VIEW COMPARISON:  12/02/2017 and prior radiographs FINDINGS: A RIGHT IJ central venous catheter is noted with tip overlying  the mid SVC. No pneumothorax. An endotracheal tube with tip 3 cm above the carina, LEFT IJ central venous catheter with tip overlying the LOWER SVC, and NG tube tip overlying the LOWER esophagus/EG junction again noted. Bilateral pleural effusions and bilateral LOWER lung atelectasis again noted. IMPRESSION: RIGHT IJ central venous catheter with tip overlying the mid SVC. No pneumothorax. Otherwise unchanged appearance of the chest with bilateral pleural effusions and bilateral LOWER lung atelectasis. Electronically Signed   By: Harmon Pier M.D.   On: 12/02/2017 17:32   Dg Chest Port 1 View  Result Date: 12/02/2017 CLINICAL DATA:  Check endotracheal tube placement EXAM: PORTABLE CHEST 1 VIEW COMPARISON:  12/02/2017 FINDINGS: Left jugular central line is again seen. Endotracheal tube and nasogastric catheter are noted. The nasogastric catheter is short of its expected location within the stomach. Bilateral pleural effusions are again noted and stable. Cardiomegaly is seen and stable. IMPRESSION: Endotracheal tube and nasogastric catheter as described. The remainder of the chest is stable from the prior exam. Electronically Signed   By: Alcide Clever M.D.   On: 12/02/2017 14:48   Dg Chest Port 1 View  Result Date: 12/02/2017 CLINICAL DATA:  Attempted right internal jugular central venous catheter placement. EXAM: PORTABLE CHEST 1 VIEW COMPARISON:  12/02/2017 at 0435 hours FINDINGS: A left jugular catheter terminates over the lower SVC, unchanged. The cardiomediastinal silhouette is unchanged with cardiac enlargement again noted. Bilateral pleural effusions and bibasilar parenchymal lung opacities suggesting atelectasis are unchanged. No pneumothorax is identified. IMPRESSION: 1. No pneumothorax following attempted central line placement. 2. Unchanged bilateral pleural effusions and basilar atelectasis. Electronically Signed   By: Sebastian Ache M.D.   On: 12/02/2017 13:28  Dg Chest Port 1 View  Result Date:  12/02/2017 CLINICAL DATA:  Follow-up pleural effusions EXAM: PORTABLE CHEST 1 VIEW COMPARISON:  12/09/2017 FINDINGS: Left internal jugular central line is unchanged with its tip in the SVC above the right atrium. Enlarged cardiac silhouette again demonstrated. Bilateral pleural effusions persist, similar to yesterday, with atelectasis in both lower lobes. Upper lungs remain well aerated. No qualitatively new finding. IMPRESSION: Persistent bilateral effusions and lower lobe atelectasis, not significantly changed since yesterday allowing technical differences. Electronically Signed   By: Paulina Fusi M.D.   On: 12/02/2017 06:53   Dg Chest Portable 1 View  Result Date: 12/16/2017 CLINICAL DATA:  Evaluate for central line placement. EXAM: PORTABLE CHEST 1 VIEW COMPARISON:  Portable film at 1025 hours. FINDINGS: Unchanged cardiomediastinal silhouette and aeration. LEFT IJ catheter has been inserted, and lies with its tip at the cavoatrial junction. There is no pneumothorax. IMPRESSION: LEFT IJ catheter tip at cavoatrial junction. No pneumothorax. Stable aeration. Electronically Signed   By: Elsie Stain M.D.   On: 12/13/2017 19:22   Dg Chest Portable 1 View  Result Date: 12/29/2017 CLINICAL DATA:  Lower extremity edema and weakness, history CHF EXAM: PORTABLE CHEST 1 VIEW COMPARISON:  Portable exam 1025 hours compared to 11/07/2017 FINDINGS: Enlargement of cardiac silhouette with pulmonary vascular congestion. Bibasilar pleural effusions and atelectasis slightly increased versus prior exam. Question mild pulmonary edema. No pneumothorax. Bones demineralized. IMPRESSION: Probable mild CHF with increased bibasilar effusions and atelectasis. Electronically Signed   By: Ulyses Southward M.D.   On: 12/29/2017 10:37   Dg Abd Portable 1v  Result Date: 12/02/2017 CLINICAL DATA:  Check nasogastric catheter placement EXAM: PORTABLE ABDOMEN - 1 VIEW COMPARISON:  None. FINDINGS: Scattered large and small bowel gas is noted.  There is a nasogastric catheter identified although the catheter is short of the stomach with both the proximal side port and tip lying in the distal esophagus. This should be advanced several cm. IMPRESSION: Nasogastric catheter in the distal esophagus. Electronically Signed   By: Alcide Clever M.D.   On: 12/02/2017 14:52    Microbiology Recent Results (from the past 240 hour(s))  Blood culture (routine x 2)     Status: Abnormal   Collection Time: 12/23/2017 10:20 AM  Result Value Ref Range Status   Specimen Description BLOOD LEFT FOREARM  Final   Special Requests   Final    BOTTLES DRAWN AEROBIC AND ANAEROBIC Blood Culture adequate volume   Culture  Setup Time   Final    GRAM POSITIVE COCCI IN BOTH AEROBIC AND ANAEROBIC BOTTLES CRITICAL RESULT CALLED TO, READ BACK BY AND VERIFIED WITH: L SEAY PHARMD 12/09/2017 2319 JDW Performed at [facility], [address] [place], [place] [address]    Culture METHICILLIN RESISTANT STAPHYLOCOCCUS AUREUS (A)  Final   Report Status 12/03/2017 FINAL  Final   Organism ID, Bacteria METHICILLIN RESISTANT STAPHYLOCOCCUS AUREUS  Final      Susceptibility   Methicillin resistant staphylococcus aureus - MIC*    CIPROFLOXACIN >=8 RESISTANT Resistant     ERYTHROMYCIN >=8 RESISTANT Resistant     GENTAMICIN <=0.5 SENSITIVE Sensitive     OXACILLIN >=4 RESISTANT Resistant     TETRACYCLINE <=1 SENSITIVE Sensitive     VANCOMYCIN 1 SENSITIVE Sensitive     TRIMETH/SULFA <=10 SENSITIVE Sensitive     CLINDAMYCIN <=0.25 SENSITIVE Sensitive     RIFAMPIN <=0.5 SENSITIVE Sensitive     Inducible Clindamycin NEGATIVE Sensitive     * METHICILLIN  RESISTANT STAPHYLOCOCCUS AUREUS  Blood Culture ID Panel (Reflexed)     Status: Abnormal   Collection Time: 12/24/2017 10:20 AM  Result Value Ref Range Status   Enterococcus species NOT DETECTED NOT DETECTED Final   Listeria monocytogenes NOT DETECTED NOT DETECTED Final   Staphylococcus species DETECTED (A) NOT DETECTED Final     Comment: CRITICAL RESULT CALLED TO, READ BACK BY AND VERIFIED WITH: L SEAY PHARMD 12-24-17 2319 JDW    Staphylococcus aureus DETECTED (A) NOT DETECTED Final    Comment: Methicillin (oxacillin)-resistant Staphylococcus aureus (MRSA). MRSA is predictably resistant to beta-lactam antibiotics (except ceftaroline). Preferred therapy is vancomycin unless clinically contraindicated. Patient requires contact precautions if  hospitalized. CRITICAL RESULT CALLED TO, READ BACK BY AND VERIFIED WITH: L SEAY PHARMD 2017/12/24 2319 JDW    Methicillin resistance DETECTED (A) NOT DETECTED Final    Comment: CRITICAL RESULT CALLED TO, READ BACK BY AND VERIFIED WITH: L SEAY PHARMD 12-24-2017 2319 JDW    Streptococcus species NOT DETECTED NOT DETECTED Final   Streptococcus agalactiae NOT DETECTED NOT DETECTED Final   Streptococcus pneumoniae NOT DETECTED NOT DETECTED Final   Streptococcus pyogenes NOT DETECTED NOT DETECTED Final   Acinetobacter baumannii NOT DETECTED NOT DETECTED Final   Enterobacteriaceae species NOT DETECTED NOT DETECTED Final   Enterobacter cloacae complex NOT DETECTED NOT DETECTED Final   Escherichia coli NOT DETECTED NOT DETECTED Final   Klebsiella oxytoca NOT DETECTED NOT DETECTED Final   Klebsiella pneumoniae NOT DETECTED NOT DETECTED Final   Proteus species NOT DETECTED NOT DETECTED Final   Serratia marcescens NOT DETECTED NOT DETECTED Final   Haemophilus influenzae NOT DETECTED NOT DETECTED Final   Neisseria meningitidis NOT DETECTED NOT DETECTED Final   Pseudomonas aeruginosa NOT DETECTED NOT DETECTED Final   Candida albicans NOT DETECTED NOT DETECTED Final   Candida glabrata NOT DETECTED NOT DETECTED Final   Candida krusei NOT DETECTED NOT DETECTED Final   Candida parapsilosis NOT DETECTED NOT DETECTED Final   Candida tropicalis NOT DETECTED NOT DETECTED Final  Blood culture (routine x 2)     Status: Abnormal   Collection Time: December 24, 2017 11:51 AM  Result Value Ref Range Status    Specimen Description BLOOD RIGHT FOREARM  Final   Special Requests   Final    BOTTLES DRAWN AEROBIC AND ANAEROBIC Blood Culture results may not be optimal due to an inadequate volume of blood received in culture bottles   Culture  Setup Time   Final    GRAM POSITIVE COCCI IN BOTH AEROBIC AND ANAEROBIC BOTTLES CRITICAL VALUE NOTED.  VALUE IS CONSISTENT WITH PREVIOUSLY REPORTED AND CALLED VALUE.    Culture (A)  Final    STAPHYLOCOCCUS AUREUS SUSCEPTIBILITIES PERFORMED ON PREVIOUS CULTURE WITHIN THE LAST 5 DAYS. Performed at [facility], [address], [place], [place] [address]    Report Status 12/03/2017 FINAL  Final  C difficile quick scan w PCR reflex     Status: Abnormal   Collection Time: Dec 24, 2017  8:57 PM  Result Value Ref Range Status   C Diff antigen POSITIVE (A) NEGATIVE Final   C Diff toxin NEGATIVE NEGATIVE Final   C Diff interpretation Results are indeterminate. See PCR results.  Final    Comment: Performed at [facility], [address], [place], [place] [address]  C. Diff by PCR, Reflexed     Status: Abnormal   Collection Time: 2017-12-24  8:57 PM  Result Value Ref Range Status   Toxigenic [name] by PCR  POSITIVE (A) NEGATIVE Final    Comment: Positive for toxigenic [name] with little to no toxin production. Only treat if clinical presentation suggests symptomatic illness. Performed at [facility], [address] [place], [place] [address]   Culture, blood (routine x 2)     Status: None   Collection Time: 12/02/17 11:14 AM  Result Value Ref Range Status   Specimen Description BLOOD RIGHT ANTECUBITAL  Final   Special Requests   Final    BOTTLES DRAWN AEROBIC AND ANAEROBIC Blood Culture adequate volume   Culture   Final    NO GROWTH 5 DAYS Performed at [facility], [address] [place], [place] [address]    Report Status 12/07/2017 FINAL  Final  Culture, blood (routine x 2)     Status: None   Collection Time: 12/02/17 11:21  AM  Result Value Ref Range Status   Specimen Description BLOOD RIGHT ARM  Final   Special Requests   Final    BOTTLES DRAWN AEROBIC AND ANAEROBIC Blood Culture adequate volume   Culture   Final    NO GROWTH 5 DAYS Performed at [facility], [address] [place], [place] [address]    Report Status 12/07/2017 FINAL  Final  Culture, respiratory (NON-Expectorated)     Status: None   Collection Time: 12/02/17 10:08 PM  Result Value Ref Range Status   Specimen Description ENDOTRACHEAL  Final   Special Requests   Final    NONE Performed at [facility], [address] [place], [place] [address]    Gram Stain   Final    MODERATE WBC PRESENT, PREDOMINANTLY PMN RARE GRAM POSITIVE COCCI    Culture   Final    RARE METHICILLIN RESISTANT STAPHYLOCOCCUS AUREUS RARE CANDIDA ALBICANS    Report Status 12/06/2017 FINAL  Final   Organism ID, Bacteria METHICILLIN RESISTANT STAPHYLOCOCCUS AUREUS  Final      Susceptibility   Methicillin resistant staphylococcus aureus - MIC*    CIPROFLOXACIN >=8 RESISTANT Resistant     ERYTHROMYCIN >=8 RESISTANT Resistant     GENTAMICIN <=0.5 SENSITIVE Sensitive     OXACILLIN >=4 RESISTANT Resistant     TETRACYCLINE <=1 SENSITIVE Sensitive     VANCOMYCIN <=0.5 SENSITIVE Sensitive     TRIMETH/SULFA <=10 SENSITIVE Sensitive     CLINDAMYCIN <=0.25 SENSITIVE Sensitive     RIFAMPIN <=0.5 SENSITIVE Sensitive     Inducible Clindamycin NEGATIVE Sensitive     * RARE METHICILLIN RESISTANT STAPHYLOCOCCUS AUREUS    Lab Basic Metabolic Panel: Recent Labs  Lab 12/06/17 0400  12/07/17 0500  12/08/17 0500 12/08/17 1630 12/09/17 0213 12/09/17 1603 12/05/2017 0417  NA 134*   < > 135   < > 137 135 136 135 135  K 4.4   < > 4.1   < > 4.3 5.7* 4.6 4.8 4.3  CL 105   < > 107   < > 105 107 102 103 102  CO2 18*   < > 19*   < > 27 21* 25 22 24   GLUCOSE 123*   < > 121*   < > 120* 128* 91 122* 126*  BUN 22*   < > 22*   < > 24* 24* 22* 23* 24*  CREATININE 0.90    < > 0.87   < > 0.85 0.90 0.88 0.84 0.85  CALCIUM 7.5*   < > 7.5*   < > 7.6* 7.3* 7.8* 7.8* 7.7*  MG 2.2  --  2.1  --  2.2  --  2.3  --  2.4  PHOS 1.9*   < > 1.7*   < > 2.2* 5.2* 3.8 4.0 3.0   < > = values in this interval not displayed.   Liver Function Tests: Recent Labs  Lab 12/08/17 0500 12/08/17 1630 12/09/17 0213 12/09/17 1603 Dec 27, 2017 0417  ALBUMIN 2.1* 2.0* 2.2* 2.1* 2.1*   No results for input(s): LIPASE, AMYLASE in the last 168 hours. No results for input(s): AMMONIA in the last 168 hours. CBC: Recent Labs  Lab 12/06/17 0400 12/07/17 0500 12/08/17 0500 12/09/17 0213 27-Dec-2017 0417  WBC 13.1* 13.9* 16.6* 17.7* 17.5*  HGB 11.0* 10.8* 9.9* 9.8* 9.5*  HCT 36.7* 35.7* 32.9* 33.5* 32.3*  MCV 76.6* 75.3* 76.2* 78.3 78.2  PLT 28* 25* 20* 22* 21*   Cardiac Enzymes: No results for input(s): CKTOTAL, CKMB, CKMBINDEX, TROPONINI in the last 168 hours. Sepsis Labs: Recent Labs  Lab 12/07/17 0500 12/08/17 0500 12/09/17 0213 2017/12/27 0417  WBC 13.9* 16.6* 17.7* 17.5*    Procedures/Operations  Mechanical ventilation, CRRT.   Aloysious Vangieson 12/27/17, 1:50 PM

## 2018-01-01 DEATH — deceased

## 2020-02-05 IMAGING — MR MR THORACIC SPINE WO/W CM
13 of 34 series · 14 of 48 positions shown · IV contrast (Yes)
Comparison: Brain MRI 06/09/2017. CTA chest, abdomen, and pelvis
08/14/2017. Lumbar spine CT 12/30/2015.

CLINICAL DATA: Hospitalized in [REDACTED] with bacteremia, mitral
valve endocarditis, CNS septic emboli, and thoracic spine discitis.
Persistent back pain.

EXAM:
MRI HEAD WITHOUT AND WITH CONTRAST
MRI THORACIC SPINE WITHOUT AND WITH CONTRAST
MRI LUMBAR SPINE WITHOUT AND WITH CONTRAST
TECHNIQUE: Multiplanar, multiecho pulse sequences of the brain and surrounding
structures, and cervical spine, to include the craniocervical
junction and cervicothoracic junction, were obtained without and
with intravenous contrast.
Multiplanar and multiecho pulse sequences of the thoracic and lumbar
spine were obtained without and with intravenous contrast.
CONTRAST:  9mL MULTIHANCE GADOBENATE DIMEGLUMINE 529 MG/ML IV SOLN

[Series 7: T2 · axial · 5.0mm · 0.47mm/px · 1 of 24 slices shown (1 of 7)]
[im 1/24]
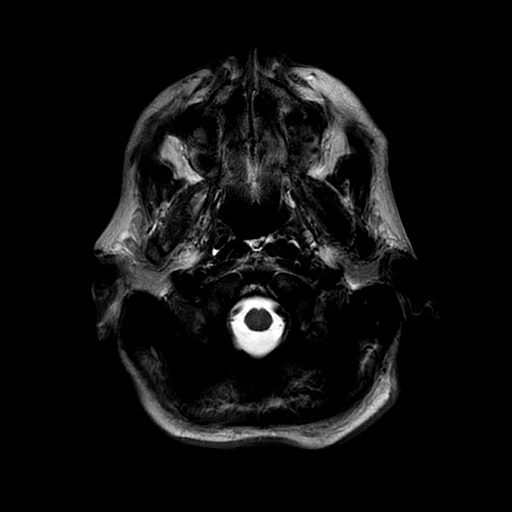

[Series 11: T2 · coronal · 5.0mm · 0.39mm/px · 1 of 27 slices shown (2 of 7)]
[im 1/27]
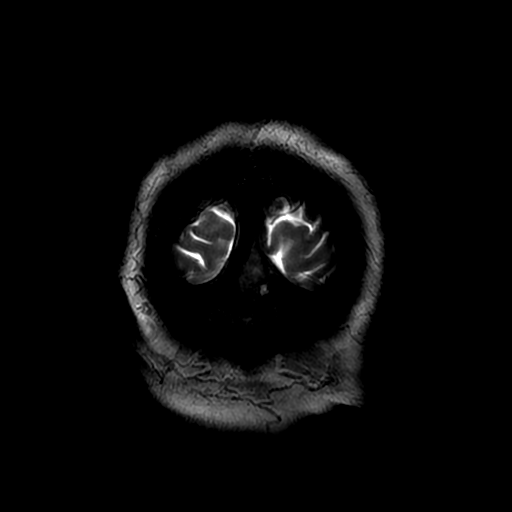

[Series 13: T1 · sagittal · 3.0mm · 0.90mm/px · 1 of 13 slices shown (1 of 5)]
[im 1/13]
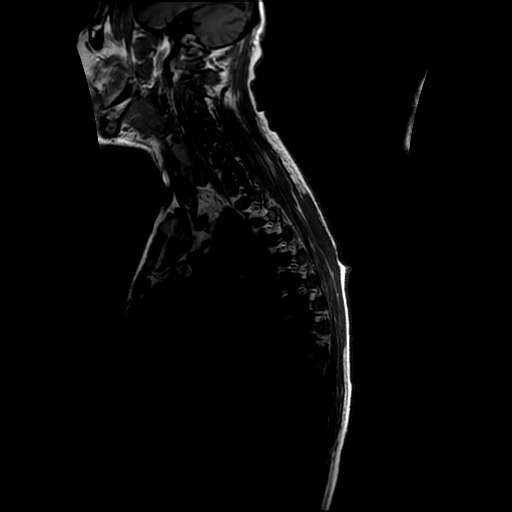

[Series 16: T2 · sagittal · 3.0mm · 0.62mm/px · 1 of 17 slices shown (3 of 7)]
[im 1/17]
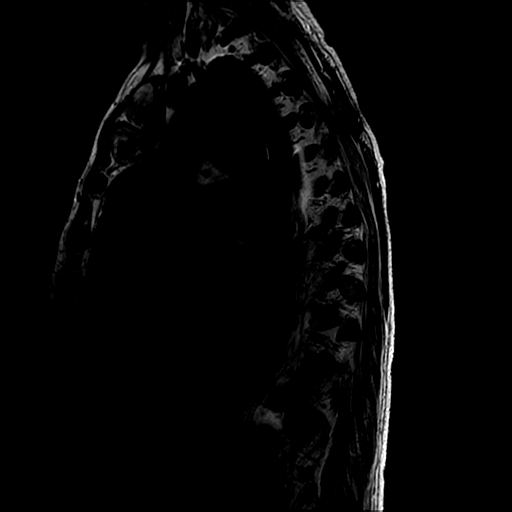

[Series 17: T1 · sagittal · 3.0mm · 0.62mm/px · 1 of 17 slices shown (2 of 5)]
[im 1/17]
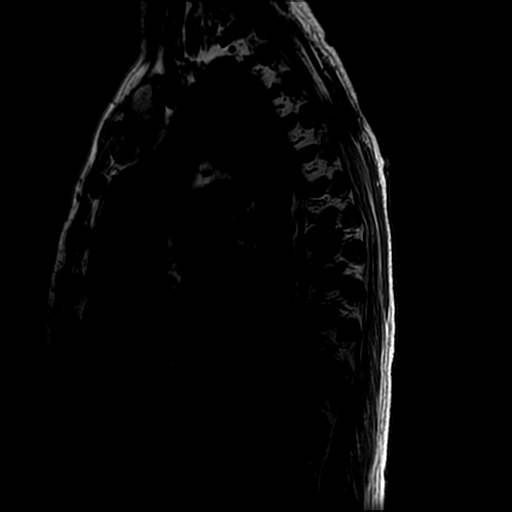

[Series 18: T2 · axial · 4.0mm · 0.39mm/px · 1 of 33 slices shown (4 of 7)]
[im 1/33]
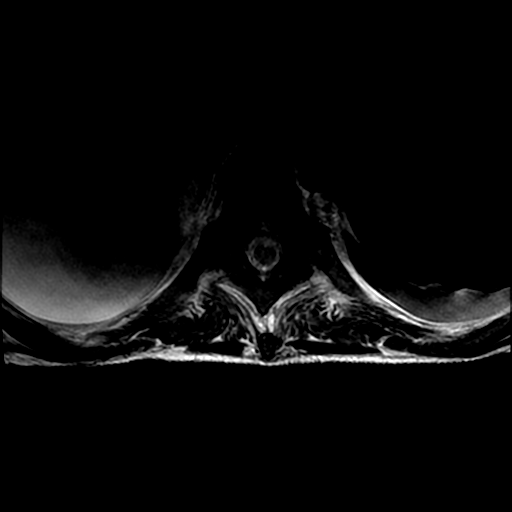

[Series 19: T2 · axial · 4.0mm · 0.39mm/px · 1 of 30 slices shown (5 of 7)]
[im 1/30]
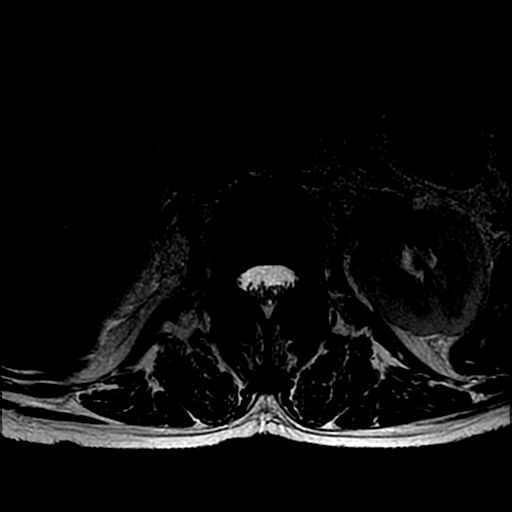

[Series 22: T1 · axial · non-contrast · 4.0mm · 0.39mm/px · 1 of 33 slices shown (3 of 5)]
[im 1/33]
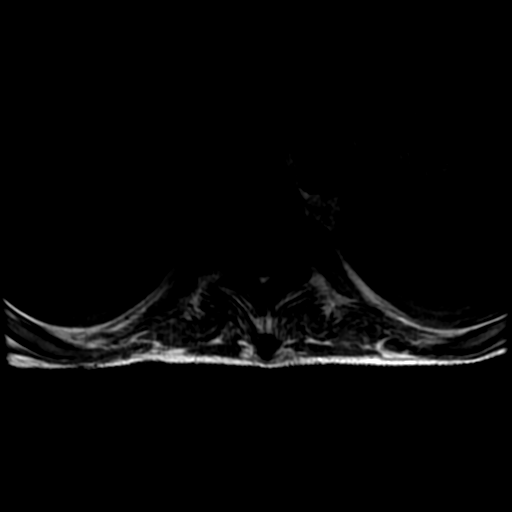

[Series 23: T1 · axial · non-contrast · 4.0mm · 0.39mm/px · 1 of 30 slices shown (4 of 5)]
[im 1/30]
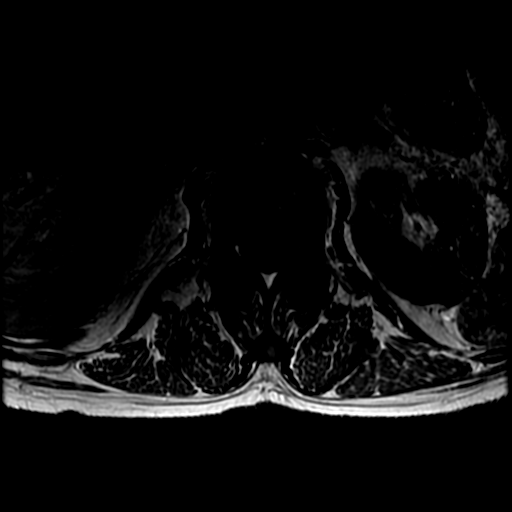

[Series 25: T2 · sagittal · 4.0mm · 0.51mm/px · 1 of 14 slices shown (6 of 7)]
[im 1/14]
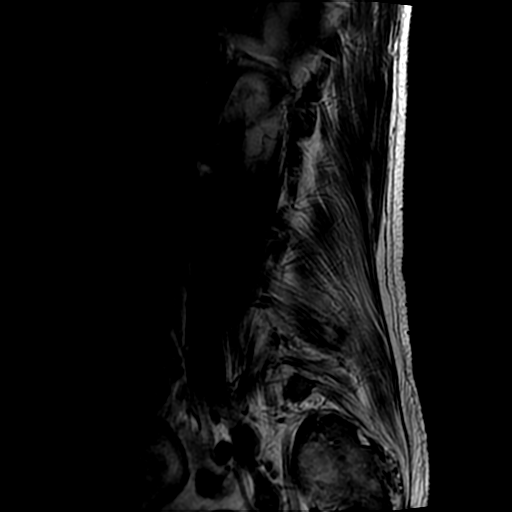

[Series 29: T1 · sagittal · 4.0mm · 0.51mm/px · 1 of 14 slices shown (5 of 5)]
[im 1/14]
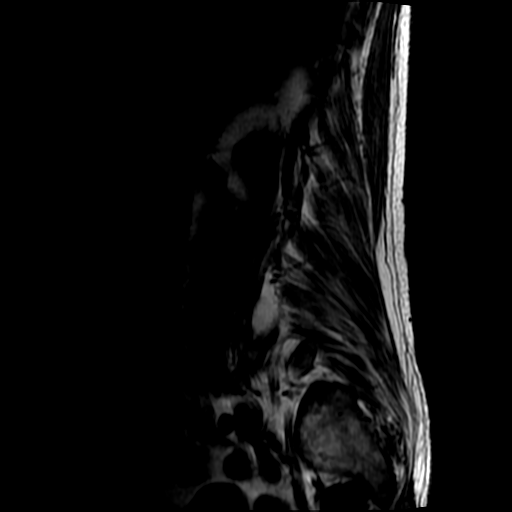

[Series 31: T2 · axial · 4.0mm · 0.39mm/px · z∈[-620,-406]mm · 2 of 39 slices shown (7 of 7)]
[im 1/39]
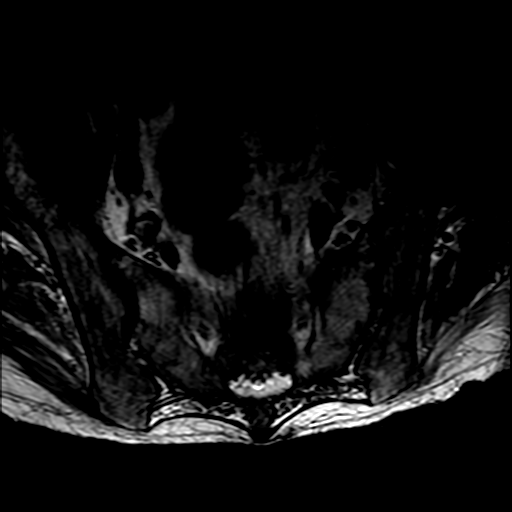
[im 39/39]
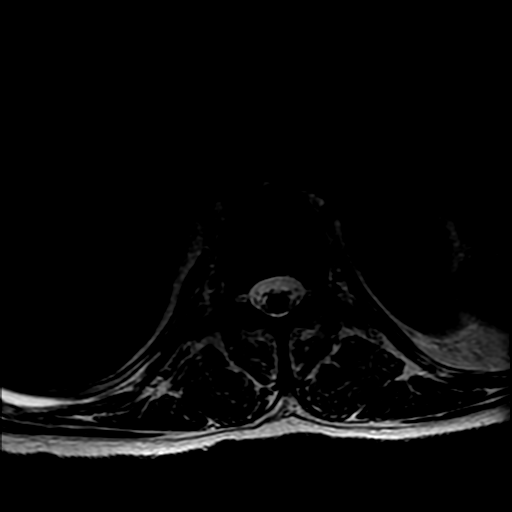

[Series 41: T2 post-contrast · coronal · 5.0mm · 0.39mm/px · 1 of 30 slices shown]
[im 1/30]
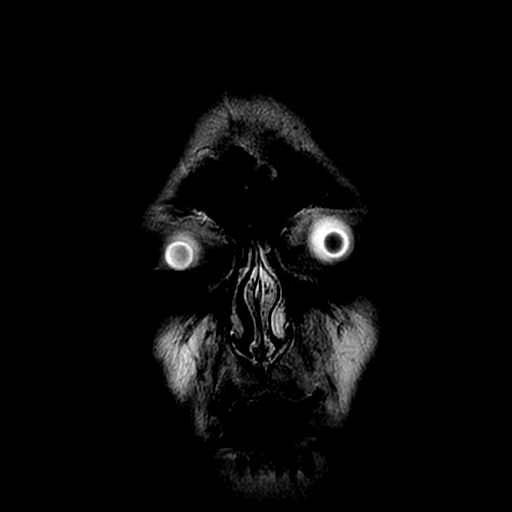

[14 of 48 positions shown; findings below may reference images not displayed]

FINDINGS: MRI HEAD FINDINGS

Brain: Embolic infarcts throughout the brain on the prior MRI
demonstrate interval evolution. Associated diffusion abnormality has
decreased though not completely resolved in some of the areas of
infarction, and there is developing encephalomalacia. Associated
chronic blood products are present with the majority of the
infarcts. Multiple infarcts demonstrate enhancement.

No definite restricted diffusion is seen to clearly indicate acute
infarction. There are a few subcentimeter infarcts in the left
centrum semiovale which are new from the prior MRI and demonstrate
mild trace diffusion abnormality without reduced ADC, consistent
with interval subacute infarcts.

No intracranial mass, midline shift, or extra-axial fluid collection
is seen. There is mild cerebral atrophy. No abnormal meningeal
enhancement is identified.

Vascular: Major intracranial vascular flow voids are preserved.

Skull and upper cervical spine: Unremarkable bone marrow signal.

Sinuses/Orbits: Unremarkable orbits. Clear paranasal sinuses. Trace
left mastoid fluid.

Other: None.

MRI THORACIC SPINE FINDINGS

Spinal numbering is performed by counting down from the
craniocervical junction. This results in different numbering than
what was reported on the prior CTA due to transitional lumbosacral
anatomy. Some sequences are up to moderately motion degraded.

Alignment: Slight focal kyphosis at T7-8.  No listhesis.

Vertebrae: Disc and endplate destruction at T7-8 as seen on prior CT
with diffuse enhancement throughout the residual disc space and
marrow of both T7 and T8 vertebral bodies extending into the
pedicles. Minimal dorsal epidural enhancement at T7-8 without
evidence of epidural abscess. Partially visualized marrow edema and
enhancement in the C6 and C7 vertebral bodies with endplate
irregularity, disc space narrowing, and enhancement anteriorly in
the disc space. No evidence of C6-7 epidural abscess on limited
sagittal imaging. Very mild endplate edema and enhancement at T11-12
favored to be degenerative given lack of disc abnormality or
endplate erosion to suggest infection. Background diminished T1 bone
marrow signal intensity diffusely is likely related to patient's
known anemia. 1.4 cm enhancing lesion in the posterior elements on
the right at T3 is consistent with an hemangioma. A smaller atypical
hemangioma is also suspected in the right T2 pedicle.

Cord:  Normal signal.

Paraspinal and other soft tissues: Paravertebral soft tissue
edema/phlegmon from T6-T8. Interspinous soft tissue edema and
enhancement at T7-8. No paraspinal abscess identified. Partially
visualized prevertebral inflammation in the lower cervical spine.
Moderate right and small left pleural effusions with associated
compressive atelectasis on the right. Small volume fluid in the
esophagus. Partially visualized endotracheal tube and tracheal
secretions.

Disc levels:

Moderate disc space narrowing, disc bulging, and endplate spurring
at C7-T1 result in moderate right and mild-to-moderate left neural
foraminal stenosis and at most mild spinal stenosis. Broad-based
posterior disc osteophyte complex and facet hypertrophy at T7-8
result in mild spinal stenosis without cord compression. Mild disc
bulging and scattered tiny disc protrusions elsewhere in the
thoracic spine do not result in spinal stenosis.

MRI LUMBAR SPINE FINDINGS

Some sequences are mildly motion degraded.

Segmentation: Transitional lumbosacral anatomy with a lumbarized S1.

Alignment: Facet mediated anterolisthesis of L5 on S1 measuring 4
mm.

Vertebrae: Diffusely diminished T1 bone marrow signal intensity
likely related to anemia. Predominantly type 2 degenerative endplate
changes at S1-2. Mild type 1 changes at L3-4. No evidence of
infectious discitis-osteomyelitis. No fracture.

Conus medullaris: Extends to the L2 level and appears normal.

Paraspinal and other soft tissues: Mild nonspecific posterior
paraspinal muscle edema bilaterally in the mid and lower lumbar
spine. No fluid collection.

Disc levels:

L1-2: Negative.

L2-3: Minimal disc bulging without stenosis.

L3-4: Mild disc space narrowing. Mild disc bulging and mild facet
and ligamentum flavum hypertrophy result in mild bilateral lateral
recess stenosis and mild left neural foraminal stenosis without
significant spinal stenosis.

L4-5: Mild disc space narrowing. Mild disc bulging asymmetric to the
right and moderate right and mild left facet hypertrophy result in
mild right lateral recess stenosis and mild right neural foraminal
stenosis without significant spinal stenosis.

L5-S1: Anterolisthesis with disc uncovering and severe facet
hypertrophy result in mild spinal stenosis, mild-to-moderate
bilateral lateral recess stenosis, and mild-to-moderate bilateral
neural foraminal stenosis.

S1-2: Transitional level with disc space narrowing. Mild disc
bulging and moderate facet arthrosis result in mild left neural
foraminal stenosis without spinal stenosis.
IMPRESSION: 1. Interval evolution of widespread embolic brain infarcts from
2. Interval tiny subacute infarcts in the left centrum semiovale. No
acute infarct.
3. Transitional lumbosacral anatomy as above.
4. T7-8 discitis-osteomyelitis with paravertebral soft tissue
edema/phlegmon. No epidural or paraspinal abscess.
5. Partially visualized disc and endplate enhancement at C6-7
suspicious for discitis-osteomyelitis with prevertebral
inflammation. No evidence of epidural abscess though this was
incompletely imaged.
6. Mild spinal stenosis at T7-8.
7. No evidence of discitis-osteomyelitis in the lumbar spine.
8. Nonspecific bilateral lumbar posterior paraspinal muscle edema
without abscess.
9. Lumbar disc and facet degeneration most notable at L5-S1 where
there is grade 1 anterolisthesis, mild spinal stenosis, and
mild-to-moderate lateral recess and neural foraminal stenosis.
10. Moderate right and small left pleural effusions.

## 2020-04-06 IMAGING — CR DG ABDOMEN 1V
2 series · 2 of 2 positions shown · non-contrast
Comparison: CT 10/08/2017

CLINICAL DATA: Abdominal distension

EXAM:
ABDOMEN - 1 VIEW

[abdomen kub (1 of 2)]
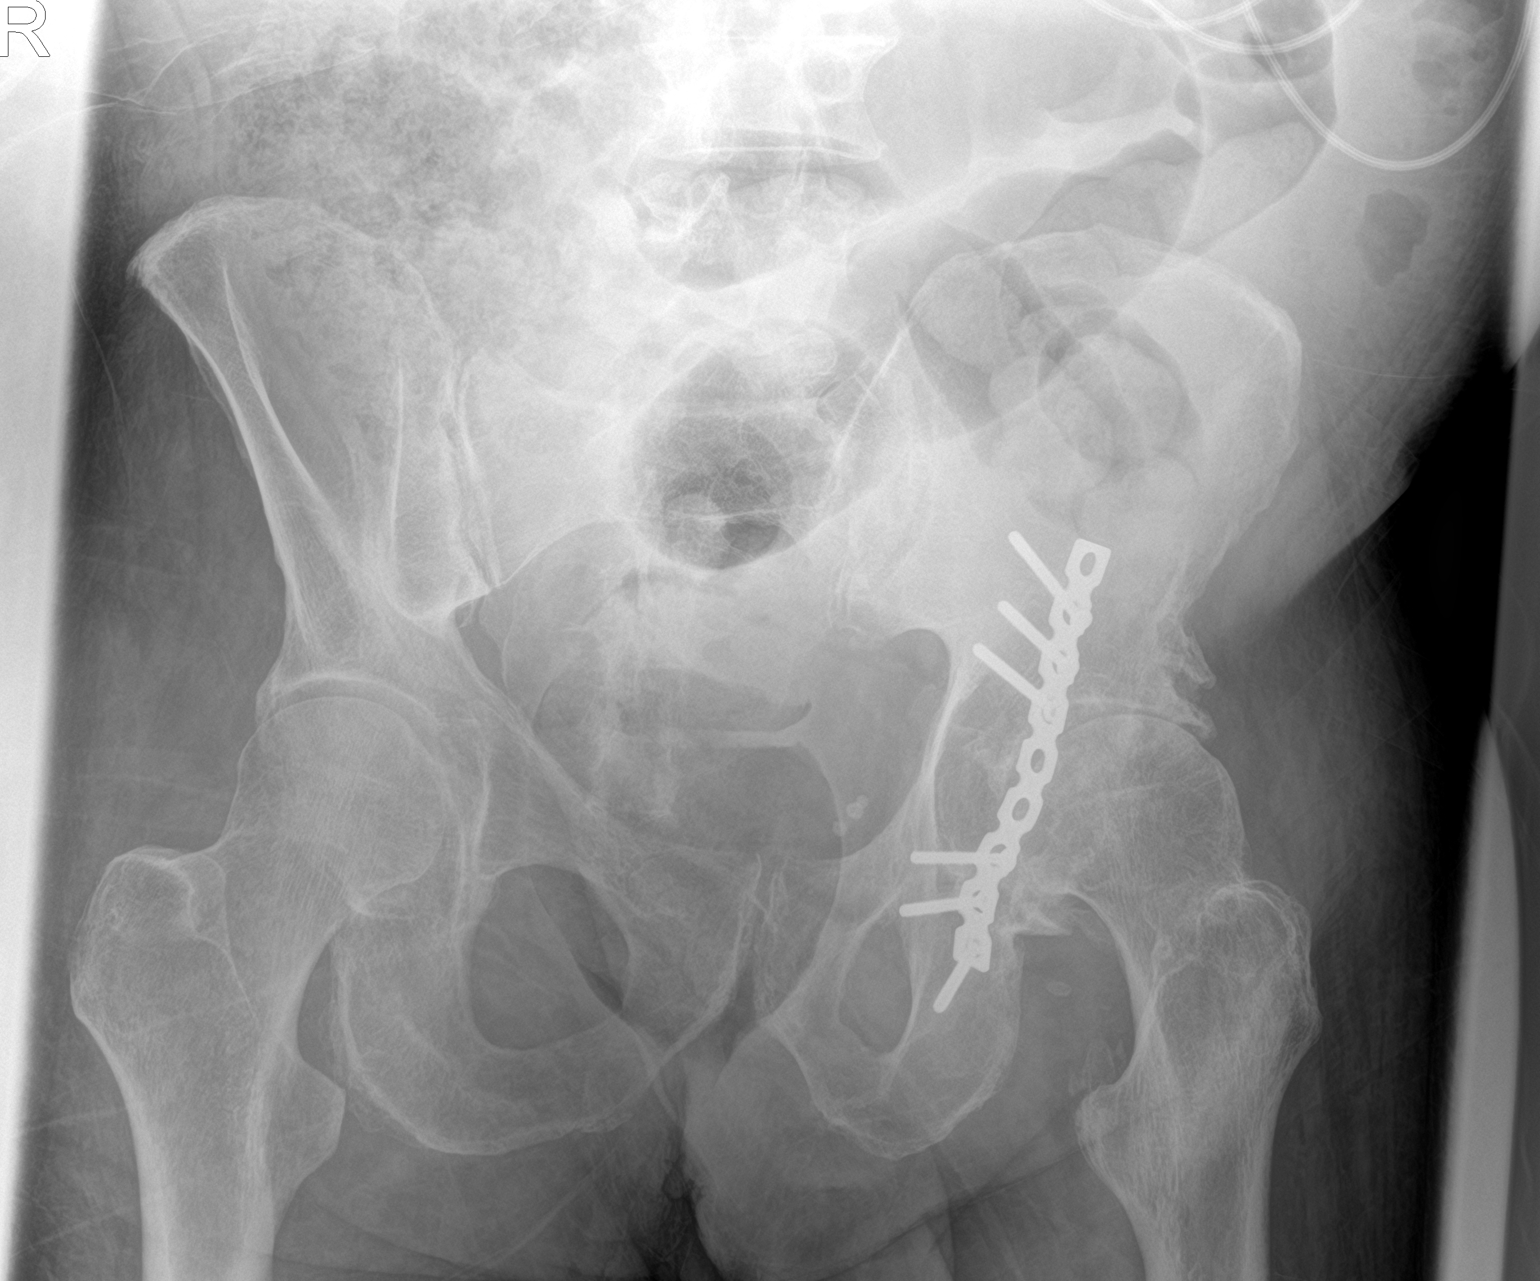

[abdomen kub (2 of 2)]
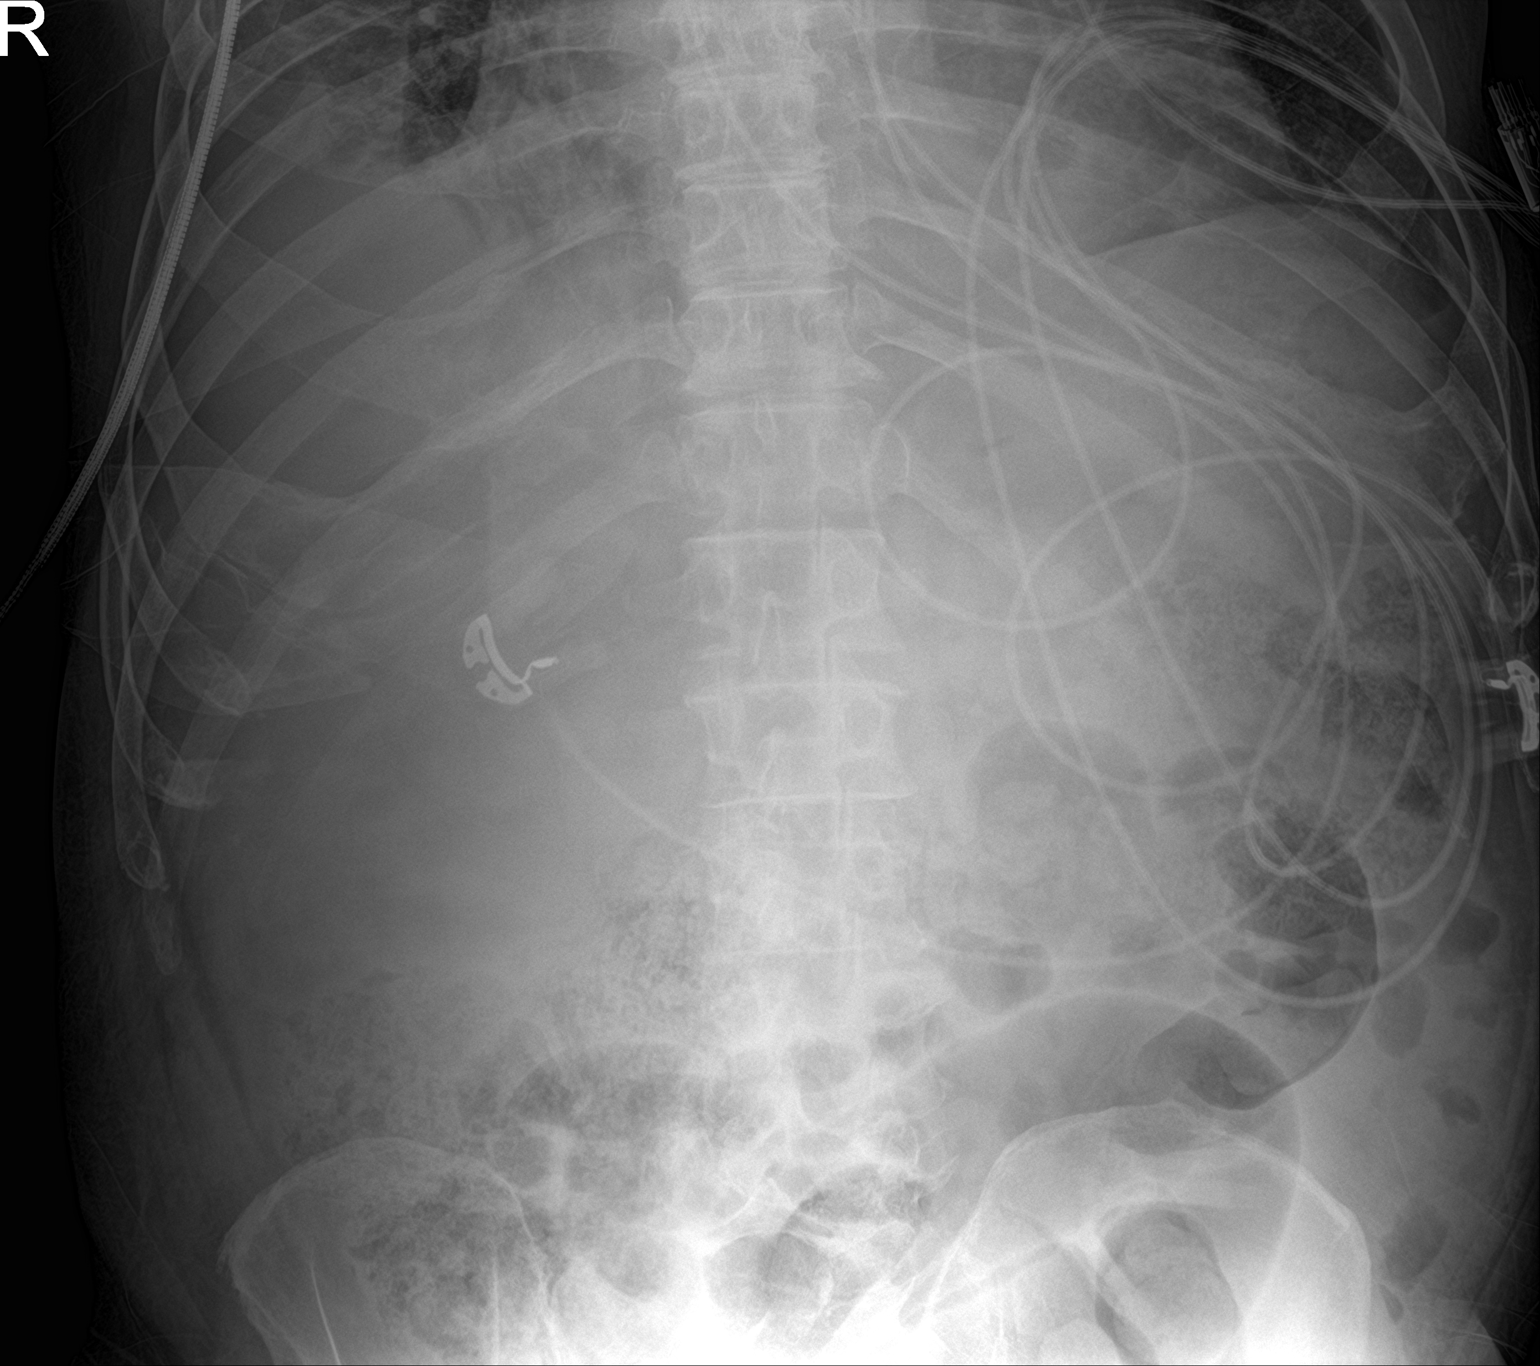

[2 of 2 positions shown; findings below may reference images not displayed]

FINDINGS: Small pleural effusions at the bases with airspace disease at the
right base. Mild gaseous enlargement of colon with moderate to large
amount of feces in the colon. No abnormal calcification. Surgical
plate and fixating screws left acetabulum.
IMPRESSION: Mild gaseous enlargement of the colon. Moderate to large amount of
stool in the colon.

## 2020-04-07 IMAGING — DX DG CHEST 2V
2 series · 2 of 2 positions shown · non-contrast
Comparison: 10/07/2017

CLINICAL DATA: Shortness of breath

EXAM:
CHEST - 2 VIEW

[x chest ap]
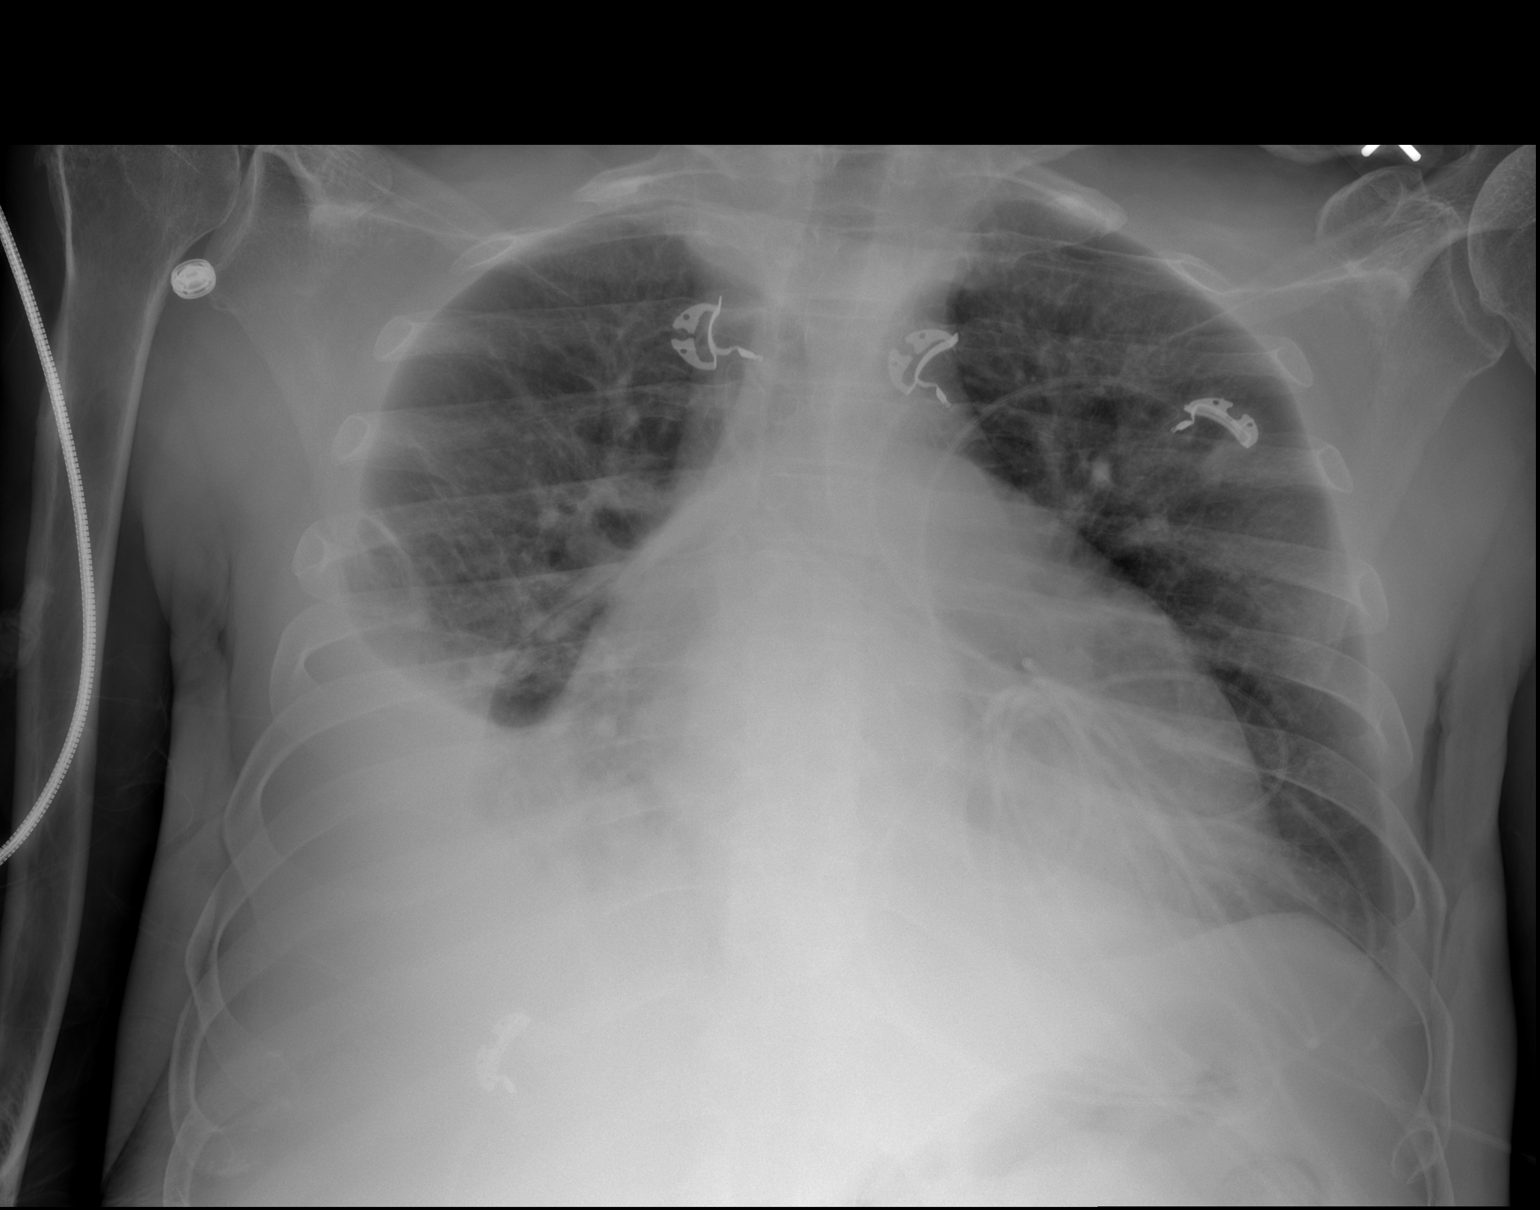

[w chest lat]
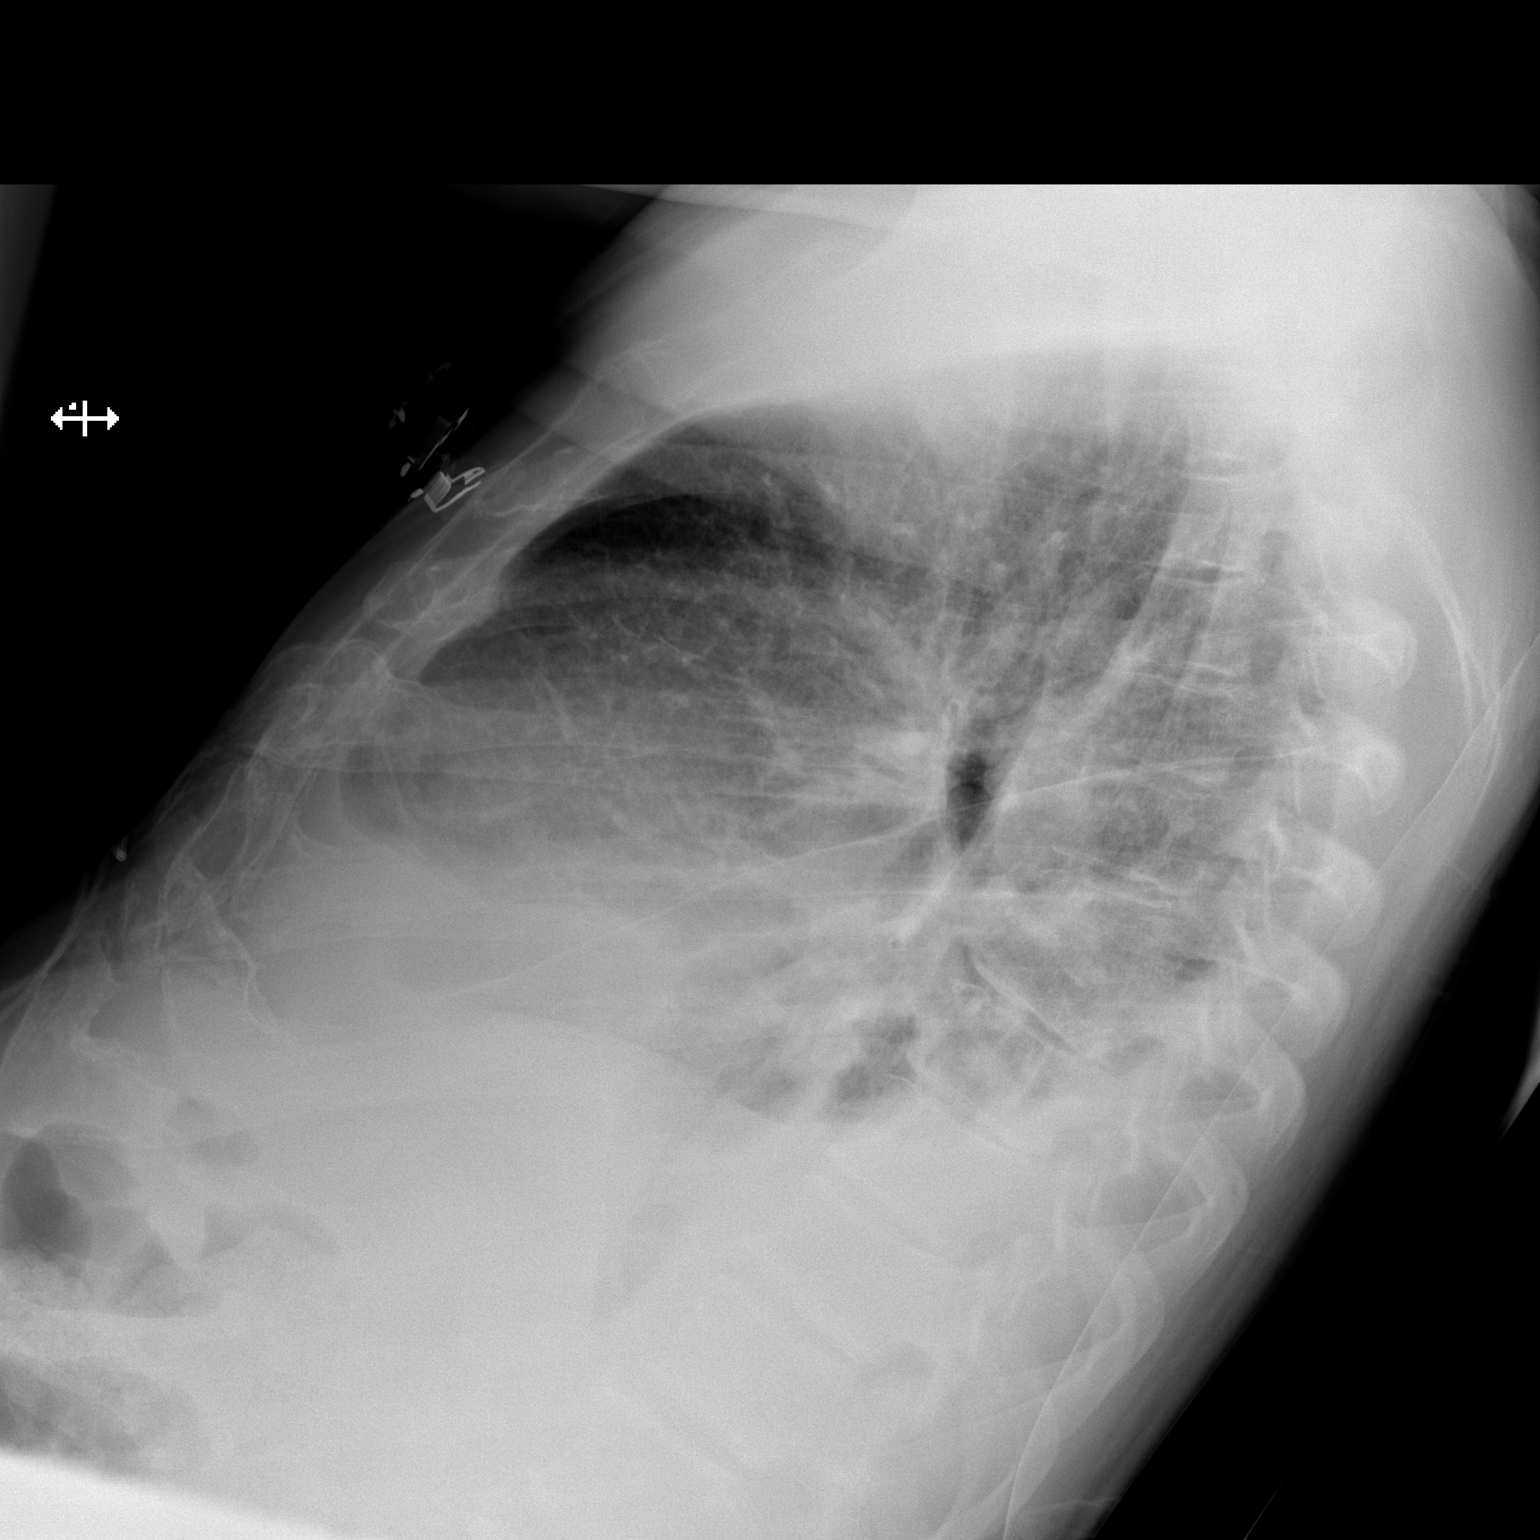

[2 of 2 positions shown; findings below may reference images not displayed]

FINDINGS: Cardiac shadow remains enlarged. Bilateral pleural effusions are
again identified right greater than left stable from the prior exam.
No new focal infiltrate is seen. No bony abnormality is noted.
IMPRESSION: Stable bilateral pleural effusions right greater than left.

## 2020-07-25 NOTE — Telephone Encounter (Signed)
error
